# Patient Record
Sex: Female | Born: 1945
Health system: Southern US, Community
[De-identification: ages and names within clinical notes are randomized; demographics above are authoritative.]

## PROBLEM LIST (undated history)

## (undated) DIAGNOSIS — J189 Pneumonia, unspecified organism: Secondary | ICD-10-CM

## (undated) DIAGNOSIS — C7951 Secondary malignant neoplasm of bone: Secondary | ICD-10-CM

## (undated) DIAGNOSIS — D649 Anemia, unspecified: Secondary | ICD-10-CM

## (undated) DIAGNOSIS — C3491 Malignant neoplasm of unspecified part of right bronchus or lung: Principal | ICD-10-CM

## (undated) DIAGNOSIS — Q632 Ectopic kidney: Secondary | ICD-10-CM

## (undated) DIAGNOSIS — I10 Essential (primary) hypertension: Secondary | ICD-10-CM

## (undated) DIAGNOSIS — IMO0001 Reserved for inherently not codable concepts without codable children: Secondary | ICD-10-CM

## (undated) DIAGNOSIS — A159 Respiratory tuberculosis unspecified: Secondary | ICD-10-CM

## (undated) DIAGNOSIS — E7800 Pure hypercholesterolemia, unspecified: Secondary | ICD-10-CM

## (undated) DIAGNOSIS — Z5111 Encounter for antineoplastic chemotherapy: Secondary | ICD-10-CM

## (undated) DIAGNOSIS — M1990 Unspecified osteoarthritis, unspecified site: Secondary | ICD-10-CM

## (undated) DIAGNOSIS — M8580 Other specified disorders of bone density and structure, unspecified site: Secondary | ICD-10-CM

## (undated) HISTORY — DX: Ectopic kidney: Q63.2

## (undated) HISTORY — DX: Encounter for antineoplastic chemotherapy: Z51.11

## (undated) HISTORY — DX: Other specified disorders of bone density and structure, unspecified site: M85.80

## (undated) HISTORY — DX: Malignant neoplasm of unspecified part of right bronchus or lung: C34.91

## (undated) HISTORY — DX: Pure hypercholesterolemia, unspecified: E78.00

## (undated) HISTORY — PX: COLONOSCOPY W/ POLYPECTOMY: SHX1380

## (undated) HISTORY — DX: Essential (primary) hypertension: I10

## (undated) HISTORY — DX: Secondary malignant neoplasm of bone: C79.51

## (undated) NOTE — *Deleted (*Deleted)
CRITICAL VALUE ALERT  Critical Value: WBC 1.3  Date & Time Notied: 05/08/20 2952   Provider Notified:Dr Ghimire at [phone]  Orders Received/Actions taken: ***

---

## 2009-04-16 ENCOUNTER — Ambulatory Visit: Payer: Self-pay | Admitting: Gynecology

## 2009-04-16 ENCOUNTER — Encounter: Payer: Self-pay | Admitting: Gynecology

## 2009-04-16 ENCOUNTER — Other Ambulatory Visit: Admission: RE | Admit: 2009-04-16 | Discharge: 2009-04-16 | Payer: Self-pay | Admitting: Gynecology

## 2010-06-04 ENCOUNTER — Ambulatory Visit: Payer: Self-pay | Admitting: Gynecology

## 2010-06-04 ENCOUNTER — Other Ambulatory Visit
Admission: RE | Admit: 2010-06-04 | Discharge: 2010-06-04 | Payer: Self-pay | Source: Home / Self Care | Admitting: Gynecology

## 2010-06-10 ENCOUNTER — Encounter
Admission: RE | Admit: 2010-06-10 | Discharge: 2010-06-10 | Payer: Self-pay | Source: Home / Self Care | Attending: Gynecology | Admitting: Gynecology

## 2010-07-07 ENCOUNTER — Ambulatory Visit
Admission: RE | Admit: 2010-07-07 | Discharge: 2010-07-07 | Payer: Self-pay | Source: Home / Self Care | Attending: Gynecology | Admitting: Gynecology

## 2010-07-13 ENCOUNTER — Other Ambulatory Visit: Payer: Self-pay

## 2010-07-17 ENCOUNTER — Other Ambulatory Visit: Payer: BC Managed Care – PPO

## 2010-07-17 DIAGNOSIS — M949 Disorder of cartilage, unspecified: Secondary | ICD-10-CM

## 2010-07-17 DIAGNOSIS — M899 Disorder of bone, unspecified: Secondary | ICD-10-CM

## 2010-08-20 ENCOUNTER — Other Ambulatory Visit (INDEPENDENT_AMBULATORY_CARE_PROVIDER_SITE_OTHER): Payer: BC Managed Care – PPO

## 2010-08-20 DIAGNOSIS — E213 Hyperparathyroidism, unspecified: Secondary | ICD-10-CM

## 2010-09-04 ENCOUNTER — Other Ambulatory Visit: Payer: BC Managed Care – PPO

## 2011-06-07 ENCOUNTER — Encounter: Payer: Self-pay | Admitting: Anesthesiology

## 2011-06-07 DIAGNOSIS — E7800 Pure hypercholesterolemia, unspecified: Secondary | ICD-10-CM | POA: Insufficient documentation

## 2011-06-07 DIAGNOSIS — M8580 Other specified disorders of bone density and structure, unspecified site: Secondary | ICD-10-CM | POA: Insufficient documentation

## 2011-06-09 ENCOUNTER — Encounter: Payer: BC Managed Care – PPO | Admitting: Gynecology

## 2011-06-10 ENCOUNTER — Ambulatory Visit (INDEPENDENT_AMBULATORY_CARE_PROVIDER_SITE_OTHER): Payer: BC Managed Care – PPO | Admitting: Gynecology

## 2011-06-10 ENCOUNTER — Encounter: Payer: Self-pay | Admitting: Gynecology

## 2011-06-10 ENCOUNTER — Other Ambulatory Visit (HOSPITAL_COMMUNITY)
Admission: RE | Admit: 2011-06-10 | Discharge: 2011-06-10 | Disposition: A | Discharge status: Home / Self Care | Payer: BC Managed Care – PPO | Source: Ambulatory Visit | Attending: Gynecology | Admitting: Gynecology

## 2011-06-10 VITALS — BP 130/78 | Ht 63.0 in | Wt 151.0 lb

## 2011-06-10 DIAGNOSIS — Z1211 Encounter for screening for malignant neoplasm of colon: Secondary | ICD-10-CM

## 2011-06-10 DIAGNOSIS — Z01419 Encounter for gynecological examination (general) (routine) without abnormal findings: Secondary | ICD-10-CM

## 2011-06-10 DIAGNOSIS — M67919 Unspecified disorder of synovium and tendon, unspecified shoulder: Secondary | ICD-10-CM

## 2011-06-10 DIAGNOSIS — R635 Abnormal weight gain: Secondary | ICD-10-CM

## 2011-06-10 DIAGNOSIS — E7800 Pure hypercholesterolemia, unspecified: Secondary | ICD-10-CM

## 2011-06-10 LAB — CBC WITH DIFFERENTIAL/PLATELET
Basophils Absolute: 0 10*3/uL (ref 0.0–0.1)
Basophils Relative: 1 % (ref 0–1)
Eosinophils Absolute: 0.1 10*3/uL (ref 0.0–0.7)
Eosinophils Relative: 2 % (ref 0–5)
HCT: 39.8 % (ref 36.0–46.0)
Hemoglobin: 12.3 g/dL (ref 12.0–15.0)
Lymphocytes Relative: 37 % (ref 12–46)
Lymphs Abs: 1.7 10*3/uL (ref 0.7–4.0)
MCH: 29.4 pg (ref 26.0–34.0)
MCHC: 30.9 g/dL (ref 30.0–36.0)
MCV: 95 fL (ref 78.0–100.0)
Monocytes Absolute: 0.5 10*3/uL (ref 0.1–1.0)
Monocytes Relative: 11 % (ref 3–12)
Neutro Abs: 2.3 10*3/uL (ref 1.7–7.7)
Neutrophils Relative %: 50 % (ref 43–77)
Platelets: 240 10*3/uL (ref 150–400)
RBC: 4.19 MIL/uL (ref 3.87–5.11)
RDW: 13.4 % (ref 11.5–15.5)
WBC: 4.6 10*3/uL (ref 4.0–10.5)

## 2011-06-10 LAB — URINALYSIS, ROUTINE W REFLEX MICROSCOPIC
Bilirubin Urine: NEGATIVE
Glucose, UA: NEGATIVE mg/dL
Hgb urine dipstick: NEGATIVE
Ketones, ur: NEGATIVE mg/dL
Leukocytes, UA: NEGATIVE
Nitrite: NEGATIVE
Protein, ur: NEGATIVE mg/dL
Specific Gravity, Urine: 1.025 (ref 1.005–1.030)
Urobilinogen, UA: 0.2 mg/dL (ref 0.0–1.0)
pH: 6 (ref 5.0–8.0)

## 2011-06-10 LAB — LIPID PANEL
Cholesterol: 221 mg/dL — ABNORMAL HIGH (ref 0–200)
HDL: 69 mg/dL (ref >39)
LDL Cholesterol: 126 mg/dL — ABNORMAL HIGH (ref 0–99)
Total CHOL/HDL Ratio: 3.2 Ratio
Triglycerides: 128 mg/dL (ref <150)
VLDL: 26 mg/dL (ref 0–40)

## 2011-06-10 LAB — TSH: TSH: 1.024 u[IU]/mL (ref 0.350–4.500)

## 2011-06-10 LAB — GLUCOSE, RANDOM: Glucose, Bld: 86 mg/dL (ref 70–99)

## 2011-06-10 MED ORDER — PREDNISONE (PAK) 10 MG PO TABS: 10.0000 mg | ORAL_TABLET | Freq: Every day | ORAL | Status: AC

## 2011-06-10 MED ORDER — MELOXICAM 7.5 MG PO TABS: 7.5000 mg | ORAL_TABLET | Freq: Every day | ORAL | Status: AC

## 2011-06-10 NOTE — Progress Notes (Addendum)
Valerie Long 1945-06-28 [id number]   History:    66 y.o.  for annual exam with several complaints one specific of the left shoulder which she has experienced pain when lifting her arm as well as day-to-day activity. She thought maybe she made done some lifting and straining herself. Her primary physician was in [facility] and had been diagnosed with hypercholesterolemia had been placed on simvastatin but she stopped it after 2 months when she read of potential side effects. She's currently on low fat diet only. Patient had bone density study in January 2012 would decrease mineralization at the left femoral neck with the lowest T score of -1.2 but normal Frax indices  Past medical history,surgical history, family history and social history were all reviewed and documented in the EPIC chart.  Gynecologic History Patient's last menstrual period was 06/09/1998. Contraception: none Last Pap: 2011. Results were: normal Last mammogram: 2012. Results were: abnormal  Obstetric History OB History    Grav Para Term Preterm Abortions TAB SAB Ect Mult Living   1 0   1  1   0     # Outc Date GA Lbr Len/2nd Wgt Sex Del Anes PTL Lv   1 SAB                ROS:  Was performed and pertinent positives and negatives are included in the history.  Exam: chaperone present  BP 130/78  Ht 5\' 3"  (1.6 m)  Wt 151 lb (68.493 kg)  BMI 26.75 kg/m2  LMP 06/09/1998  Body mass index is 26.75 kg/(m^2).  General appearance : Well developed well nourished female. No acute distress HEENT: Neck supple, trachea midline, no carotid bruits, no thyroidmegaly Lungs: Clear to auscultation, no rhonchi or wheezes, or rib retractions  Heart: Regular rate and rhythm, no murmurs or gallops Breast:Examined in sitting and supine position were symmetrical in appearance, no palpable masses or tenderness,  no skin retraction, no nipple inversion, no nipple discharge, no skin discoloration, no axillary or  supraclavicular lymphadenopathy Abdomen: no palpable masses or tenderness, no rebound or guarding Extremities: no edema or skin discoloration or tenderness. Left arm was painful on abduction Pelvic:  Bartholin, Urethra, Skene Glands: Within normal limits             Vagina: No gross lesions or discharge  Cervix: No gross lesions or discharge  Uterus  anteverted, normal size, shape and consistency, non-tender and mobile  Adnexa  Without masses or tenderness  Anus and perineum  normal   Rectovaginal  normal sphincter tone without palpated masses or tenderness             Hemoccult obtained results pending at time of this dictation     Assessment/Plan:  66 y.o. female for annual exam with apparent rotator cuff injury she will be referred to the orthopedist. Also during Pap smear it was noted that there was a raised area on the anterior cervical lip she'll be asked to return back to the office in one week for colposcopic evaluation. She was given the name of the gastroenterologist for her to schedule her colonoscopy to she has never had one. Fecal occult blood testing result pending at time of this dictation. 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 The following labs were ordered fasting lipid profile, fasting blood sugar, fecal occult blood testing, CBC, urinalysis, Pap smear as well as TSH. We'll make arrangements for her for ultrasound of the right breast at the breast center and consultation with your the pubic surgeon for rotator cuff injury.    Ok Edwards MD, 3:30 PM 06/10/2011   To help alleviate her rotator cuff discomfort patient will be placed on prednisone 10 mg 10 day pack as well as Mobic 7.5 mg daily for the  next 5-7 days.

## 2011-06-10 NOTE — Patient Instructions (Signed)
La recetas estan en la farmacia. Nos vemos la semana que viene.

## 2011-06-14 LAB — POC HEMOCCULT BLD/STL (OFFICE/1-CARD/DIAGNOSTIC): Fecal Occult Blood, POC: NEGATIVE

## 2011-06-15 ENCOUNTER — Telehealth: Payer: Self-pay | Admitting: *Deleted

## 2011-06-15 NOTE — Telephone Encounter (Signed)
Message copied by Libby Maw on Tue Jun 15, 2011 11:49 AM ------      Message from: Reynaldo Minium H      Created: Thu Jun 10, 2011  3:38 PM       Adilen Pavelko, this patient needs to be referred to the orthopedic surgeon ([facility]) for left rotator cuff injury. Patient also needs to be scheduled for right breast ultrasound as recommended by the breast Center back in January 2012 for which patient did not followup. The area that they have been following is the right breast at the 5:00 position. Thank you for schedule both of these for this patient.

## 2011-06-15 NOTE — Telephone Encounter (Signed)
Lm for patient to call about appt information.  Will see Dr. Thurston Hole on 06/22/11 @ 9am.  Records have been sent.

## 2011-06-17 ENCOUNTER — Ambulatory Visit (INDEPENDENT_AMBULATORY_CARE_PROVIDER_SITE_OTHER): Payer: BC Managed Care – PPO | Admitting: Gynecology

## 2011-06-17 ENCOUNTER — Encounter: Payer: Self-pay | Admitting: Gynecology

## 2011-06-17 VITALS — BP 124/80

## 2011-06-17 DIAGNOSIS — N952 Postmenopausal atrophic vaginitis: Secondary | ICD-10-CM

## 2011-06-17 DIAGNOSIS — N889 Noninflammatory disorder of cervix uteri, unspecified: Secondary | ICD-10-CM

## 2011-06-17 MED ORDER — ESTRADIOL 10 MCG VA TABS: 1.0000 | ORAL_TABLET | VAGINAL | Status: DC

## 2011-06-17 NOTE — Patient Instructions (Signed)
Control del colesterol  Los niveles de colesterol en el organismo estn determinados significativamente por su dieta. Los niveles de colesterol tambin se relacionan con la enfermedad cardaca. El material que sigue ayuda a explicar esta relacin y a analizar qu puede hacer para mantener su corazn sano. No todo el colesterol es malo. Las lipoprotenas de baja densidad (LDL) forman el colesterol "malo". El colesterol malo puede ocasionar depsitos de grasa que se acumulan en el interior de las arterias. Las lipoprotenas de alta densidad (HDL) es el colesterol "bueno". Ayuda a remover el colesterol LDL "malo" de la sangre. El colesterol es un factor de riesgo muy importante para la enfermedad cardaca. Otros factores de riesgo son la hipertensin arterial, el hbito de fumar, el estrs, la herencia y el peso.   El msculo cardaco obtiene el suministro de sangre a travs de las arterias coronarias. Si su colesterol LDL ("malo") est elevado y el HDL ("bueno") es bajo, tiene un factor de riesgo para que se formen depsitos de grasa en las arterias coronarias (los vasos sanguneos que suministran sangre al corazn). Esto hace que haya menos lugar para que la sangre circule. Sin la suficiente sangre y oxgeno, el msculo cardaco no puede funcionar correctamente, y usted podr sentir dolores en el pecho (angina pectoris). Cuando una arteria coronaria se cierra completamente, una parte del msculo cardaco puede morir (infarto de miocardio).  CONTROL DEL COLESTEROL Cuando el profesional que lo asiste enva la sangre al laboratorio para conocer el nivel de colesterol, puede realizarle tambin un perfil completo de los lpidos. Con esta prueba, se puede determinar la cantidad total de colesterol, as como los niveles de LDL y HDL. Los triglicridos son un tipo de grasa que circula en la sangre y que tambin puede utilizarse  para determinar el riesgo de enfermedad cardaca. En la siguiente tabla se establecen los nmeros ideales: Prueba: Colesterol total  Menos de 200 mg/dl.  Prueba: LDL "colesterol malo"  Menos de 100 mg/dl.   Menos de 70 mg/dl si tiene riesgo muy elevado de sufrir un ataque cardaco o muerte cardaca sbita.  Prueba: HDL "colesterol bueno"  Mujeres: Ms de 50 mg/dl.   Hombres: Ms de 40 mg/dl.  Prueba: Trigliceridos  Menos de 150 mg/dl.    CONTROL DEL COLESTEROL CON DIETA Aunque factores como el ejercicio y el estilo de vida son importantes, la "primera lnea de ataque" es la dieta. Esto se debe a que se sabe que ciertos alimentos hacen subir el colesterol y otros lo bajan. El objetivo debe ser equilibrar los alimentos, de modo que tengan un efecto sobre el colesterol y, an ms importante, reemplazar las grasas saturadas y trans con otros tipos de grasas, como las monoinsaturadas y las poliinsaturadas y cidos grasos omega-3 . En promedio, una persona no debe consumir ms de 15 a 17 g de grasas saturadas por da. Las grasas saturadas y trans se consideran grasas "malas", ya que elevan el colesterol LDL. Las grasas saturadas se encuentran principalmente en productos animales como carne, manteca y crema. Pero esto no significa que usted debe sacrificar todas sus comidas favoritas. Actualmente, como lo muestra el cuadro que figura al final de este documento, hay sustitutos de buen sabor, bajos en grasas y en colesterol, para la mayora de los alimentos que a usted le gusta comer. Elija aquellos alimentos alternativos que sean bajos en grasas o sin grasas. Elija cortes de carne del cuarto trasero o lomo ya que estos cortes son los que tienen menor cantidad de grasa   y colesterol. El pollo (sin piel), el pescado, la carne de ternera, y la pechuga de pavo molida son excelentes opciones. Elimine las carnes grasosas como los hotdogs o el salami. Los mariscos tienen poco o nada de grasas saturadas. Cuando  consuma carne magra, carne de aves de corral, o pescado, hgalo en porciones de 85 gramos (3 onzas). Las grasas trans tambin se llaman "aceites parcialmente hidrogenados". Son aceites manipulados cientficamente de modo que son slidos a temperatura ambiente, tienen una larga vida y mejoran el sabor y la textura de los alimentos a los que se agregan. Las grasas trans se encuentran en la margarina, masitas, crackers y alimentos horneados.  Para hornear y cocinar, el aceite es un excelente sustituto para la mantequilla. Los aceites monoinsaturados tienen un beneficio particular, ya que se cree que disminuyen el colesterol LDL (colesterol malo) y elevan el HDL. Deber evitar los aceites tropicales saturados como el de coco y el de palma.  Recuerde, adems, que puede comer sin restricciones los grupos de alimentos que son naturalmente libres de grasas saturadas y grasas trans, entre los que se incluyen el pescado, las frutas (excepto el aguacate), verduras, frijoles, cereales (cebada, arroz, cuzcuz, trigo) y las pastas (sin salsas con crema)   IDENTIFIQUE LOS ALIMENTOS QUE DISMINUYEN EL COLESTEROL  Pueden disminuir el colesterol las fibras solubles que estn en las frutas, como las manzanas, en los vegetales como el brcoli, las patatas y las zanahorias; en las legumbres como frijoles, guisantes y lentejas; y en los cereales como la cebada. Los alimentos fortificados con fitosteroles tambin pueden disminuir el colesterol. Debe consumir al menos 2 g de estos alimentos a diario para obtener el efecto de disminucin de colesterol.  En el supermercado, lea las etiquetas de los envases para identificar los alimentos bajos en grasas saturadas, libres de grasas trans y bajos en grasas, . Elija quesos que tengan solo de 2 a 3 g de grasa saturada por onza (28,35 g). Use una margarina que no dae el corazn, libre de grasas trans o aceite parcialmente hidrogenado. Al comprar alimentos horneados (galletitas dulces y  galletas) evite el aceite parcialmente hidrogenado. Los panes y bollos debern ser de granos enteros (harina de maz o de avena entera, en lugar de "harina" o "harina enriquecida"). Compre sopas en lata que no sean cremosas, con bajo contenido de sal y sin grasas adicionadas.   TCNICAS DE PREPARACIN DE LOS ALIMENTOS  Nunca fra los alimentos en aceite abundante. Si debe frer, hgalo en poco aceite y removiendo constantemente, porque as se utilizan muy pocas grasas, o utilice un spray antiadherente. Cuando le sea posible, hierva, hornee o ase las carnes y cocine los vegetales al vapor. En vez de aderezar los vegetales con mantequilla o margarina, utilice limn y hierbas, pur de manzanas y canela (para las calabazas y batatas), yogurt y salsa descremados y aderezos para ensaladas bajos en contenido graso.   BAJO EN GRASAS SATURADAS / SUSTITUTOS BAJOS EN GRASA  Carnes / Grasas saturadas (g)  Evite: Bife, corte graso (3 oz/85 g) / 11 g   Elija: Bife, corte magro (3 oz/85 g) / 4 g   Evite: Hamburguesa (3 oz/85 g) / 7 g   Elija:  Hamburguesa magra (3 oz/85 g) / 5 g   Evite: Jamn (3 oz/85 g) / 6 g   Elija:  Jamn magro (3 oz/85 g) / 2.4 g   Evite: Pollo, con piel (3 oz/85 g), Carne oscura / 4 g   Elija:  Pollo, sin piel (  3 oz/85 g), Carne oscura / 2 g   Evite: Pollo, con piel (3 oz/85 g), Carne magra / 2.5 g   Elija: Pollo, sin piel (3 oz/85 g), Carne magra / 1 g  Lcteos / Grasas saturadas (g)  Evite: Leche entera (1 taza) / 5 g   Elija: Leche con bajo contenido de grasa, 2% (1 taza) / 3 g   Elija: Leche con bajo contenido de grasa, 1% (1 taza) / 1.5 g   Elija: Leche descremada (1 taza) / 0.3 g   Evite: Queso duro (1 oz/28 g) / 6 g   Elija: Queso descremado (1 oz/28 g) / 2-3 g   Evite: Queso cottage, 4% grasa (1 taza)/ 6.5 g   Elija: Queso cottage con bajo contenido de grasa, 1% grasa (1 taza)/ 1.5 g   Evite: Helado (1 taza) / 9 g   Elija: Sorbete (1 taza) / 2.5 g    Elija: Yogurt helado sin contenido de grasa (1 taza) / 0.3 g   Elija: Barras de fruta congeladas / vestigios   Evite: Crema batida (1 cucharada) / 3.5 g   Elija: Batidos glac sin lcteos (1 cucharada) / 1 g  Condimentos / Grasas saturadas (g)  Evite: Mayonesa (1 cucharada) / 2 g   Elija: Mayonesa con bajo contenido de grasa (1 cucharada) / 1 g   Evite: Manteca (1 cucharada) / 7 g   Elija: Margarina extra light (1 cucharada) / 1 g   Evite: Aceite de coco (1 cucharada) / 11.8 g   Elija: Aceite de oliva (1 cucharada) / 1.8 g   Elija: Aceite de maz (1 cucharada) / 1.7 g   Elija: Aceite de crtamo (1 cucharada) / 1.2 g   Elija: Aceite de girasol (1 cucharada) / 1.4 g   Elija: Aceite de soja (1 cucharada) / 2.4 g   Elija: Aceite de canola (1 cucharada) / 1 g  Document Released: 05/24/2005 Document Revised: 02/03/2011 [employer] Patient Information 2012 [employer].  

## 2011-06-17 NOTE — Telephone Encounter (Signed)
Patient was informed.

## 2011-06-17 NOTE — Progress Notes (Signed)
Patient was asked to come in to the office today for colposcopic evaluation due to the fact that time of her annual exam on January 3 there was an irregularity noted on the anterior cervical lip. Her Pap smear came back normal. Her recent labs to include urinalysis, CBC, TSH, blood sugar were all normal her lipid profile demonstrated a slightly elevated total cholesterol of 221 and her LDH was elevated at 126.  Colposcopic evaluation: External genitalia no abnormalities  vagina and fornices no abnormality. Acetic acid was applied no gross lesions or colposcopic visualize lesions on the vagina surfaces fornices or cervix. Anteriorly patient has a raised cervical mucosa but similar in appearance and texture to the rest of the cervical mucosa no abnormality was noted.  Assessment/plan: Normal findings though colposcopic exam normal Pap smear. Patient reassured. Patient given literature formation in Spanish on exercise a low-fat diet. Will BE needing to check her fasting lipid profile within a year. She has an appointment with the orthopedist for what appears to be a rotator cuff injury of her left shoulder. She was given steroid pack recently with Mobic and has helped her temporarily.

## 2011-06-28 ENCOUNTER — Ambulatory Visit (INDEPENDENT_AMBULATORY_CARE_PROVIDER_SITE_OTHER): Payer: BC Managed Care – PPO | Admitting: Gynecology

## 2011-06-28 ENCOUNTER — Other Ambulatory Visit (HOSPITAL_COMMUNITY)
Admission: RE | Admit: 2011-06-28 | Discharge: 2011-06-28 | Disposition: A | Discharge status: Home / Self Care | Payer: BC Managed Care – PPO | Source: Ambulatory Visit | Attending: Gynecology | Admitting: Gynecology

## 2011-06-28 ENCOUNTER — Encounter: Payer: Self-pay | Admitting: Gynecology

## 2011-06-28 VITALS — BP 120/82

## 2011-06-28 DIAGNOSIS — Z01419 Encounter for gynecological examination (general) (routine) without abnormal findings: Secondary | ICD-10-CM | POA: Insufficient documentation

## 2011-06-28 DIAGNOSIS — R87616 Satisfactory cervical smear but lacking transformation zone: Secondary | ICD-10-CM

## 2011-06-28 DIAGNOSIS — Z1159 Encounter for screening for other viral diseases: Secondary | ICD-10-CM | POA: Insufficient documentation

## 2011-06-28 NOTE — Progress Notes (Signed)
Patient was asked to return to the office due to the fact that time of her annual exam recently there was no evidence of endocervical cells. Her Pap smear was repeated today with a vigorous endocervical sampling along with ayre spatula. Will notify her there is any abnormalities otherwise we'll see her back next year. We did discuss the new screening Pap smear guidelines should be done every 3 years then she will no longer needed after the age of 35 6 she's had no history of dysplasia in the past. Instructions were provided in Spanish and we'll follow accordingly.

## 2011-06-28 NOTE — Patient Instructions (Signed)
Si no la llamamos de aqui al [employer] estan normales y nos vemos el ano que viene.

## 2011-08-19 ENCOUNTER — Other Ambulatory Visit: Payer: Self-pay | Admitting: Gynecology

## 2011-08-19 DIAGNOSIS — N644 Mastodynia: Secondary | ICD-10-CM

## 2011-08-26 ENCOUNTER — Ambulatory Visit
Admission: RE | Admit: 2011-08-26 | Discharge: 2011-08-26 | Disposition: A | Discharge status: Home / Self Care | Payer: BC Managed Care – PPO | Source: Ambulatory Visit | Attending: Gynecology | Admitting: Gynecology

## 2011-08-26 DIAGNOSIS — N644 Mastodynia: Secondary | ICD-10-CM

## 2011-09-22 ENCOUNTER — Encounter: Payer: Self-pay | Admitting: Gynecology

## 2012-08-29 ENCOUNTER — Encounter: Payer: Self-pay | Admitting: Internal Medicine

## 2012-08-29 ENCOUNTER — Ambulatory Visit (INDEPENDENT_AMBULATORY_CARE_PROVIDER_SITE_OTHER): Payer: BC Managed Care – PPO | Admitting: Internal Medicine

## 2012-08-29 VITALS — BP 122/78 | HR 103 | Temp 98.3°F | Ht 63.0 in | Wt 155.0 lb

## 2012-08-29 DIAGNOSIS — M25519 Pain in unspecified shoulder: Secondary | ICD-10-CM | POA: Insufficient documentation

## 2012-08-29 DIAGNOSIS — Z0000 Encounter for general adult medical examination without abnormal findings: Secondary | ICD-10-CM | POA: Insufficient documentation

## 2012-08-29 MED ORDER — MELOXICAM 7.5 MG PO TABS: 7.5000 mg | ORAL_TABLET | Freq: Every day | ORAL | Status: DC | PRN

## 2012-08-29 NOTE — Patient Instructions (Addendum)
Come back fasting: CMP- FLP- CBC- TSH- Vit D---- v70 Next visit in one year

## 2012-08-29 NOTE — Assessment & Plan Note (Addendum)
Patient fall last year, developed bilateral shoulder pain, went to see orthopedic surgery, Dr. Sheliah Hatch office, 4 visits, had shots in both shoulders, left shoulder is better, still has right shoulder pain. No previous MRI. Plan: Refer to or for for a second opinion. Request mobic, Rx sent, warned about side effects

## 2012-08-29 NOTE — Progress Notes (Signed)
  Subjective:    Patient ID: Valerie Long, female    DOB: Sep 12, 1945, 67 y.o.   MRN: [id number]  HPI New patient , CPX In general doing well, did complain of shoulder pain, see assessment and plan.  Past Medical History  Diagnosis Date  . Osteopenia   . Hypercholesterolemia   . 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 Cesarean section      myomectomy   History   Social History  . Marital Status: Legally Separated    Spouse Name: N/A    Number of Children: 1  . Years of Education: N/A   Occupational History  . 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 Smoking status: Never Smoker   . Smokeless tobacco: Never Used  . Alcohol Use: Yes     Comment: SOCIAL  . Drug Use: No  . Sexually Active: No   Other Topics Concern  . Not on file   Social History Narrative   From [place], moved from [place]  to [place] 2008   Lives by herself   Diet- try to eat healthy   Exercise- usually 3-4 / week, walks , some weights   Family History  Problem Relation Age of Onset  . Diabetes Other     auncle-aunts   . Stroke Neg Hx   . Hypertension Mother     F and M  . Cancer Other     UTERINE   . CAD Father   . Breast cancer Neg Hx   . Colon cancer Neg Hx     Review of Systems  Respiratory: Negative for cough and shortness of breath.   Cardiovascular: Negative for chest pain and leg swelling.  Gastrointestinal: Negative for abdominal pain and blood in stool.  Genitourinary: Negative for dysuria, hematuria and difficulty urinating.  Psychiatric/Behavioral:       No anxiety-depression       Objective:   Physical Exam General -- alert, well-developed, NAD  Neck --no thyromegaly , normal carotid pulse Lungs -- normal respiratory effort, no intercostal retractions, no accessory muscle use, and normal breath sounds.   Heart-- normal rate, regular rhythm, no murmur, and no gallop.   Abdomen--soft,  non-tender, no distention, no masses, no HSM, no guarding, and no rigidity.   Extremities-- no pretibial edema bilaterally; shoulders symmetric to inspection, slt tender to palpation at the R AC joint, ROM slt decreased on the R  Neurologic-- alert & oriented X3 and strength normal in all extremities. Psych-- Cognition and judgment appear intact. Alert and cooperative with normal attention span and concentration.  not anxious appearing and not depressed appearing.       Assessment & Plan:

## 2012-08-29 NOTE — Assessment & Plan Note (Addendum)
Tdap 2008 per pt Shingles shot discussed, will think about it.  Cscope , Dr Loreta Ave [phone], 1 polyp, next per GI [facility] 3-13 (-) Female care per gyn, last PAP N1-2013 DEXA-- pt reports done per Dr Lily Peer Diet-exercise discussed Labs  EKG nsr

## 2012-09-01 ENCOUNTER — Other Ambulatory Visit: Payer: BC Managed Care – PPO

## 2012-09-01 ENCOUNTER — Other Ambulatory Visit (INDEPENDENT_AMBULATORY_CARE_PROVIDER_SITE_OTHER): Payer: BC Managed Care – PPO

## 2012-09-01 DIAGNOSIS — Z0000 Encounter for general adult medical examination without abnormal findings: Secondary | ICD-10-CM

## 2012-09-01 LAB — CBC WITH DIFFERENTIAL/PLATELET
Basophils Absolute: 0 10*3/uL (ref 0.0–0.1)
Basophils Relative: 0.9 % (ref 0.0–3.0)
Eosinophils Absolute: 0.1 10*3/uL (ref 0.0–0.7)
Eosinophils Relative: 2.2 % (ref 0.0–5.0)
HCT: 36.6 % (ref 36.0–46.0)
Hemoglobin: 12.4 g/dL (ref 12.0–15.0)
Lymphocytes Relative: 36.9 % (ref 12.0–46.0)
Lymphs Abs: 1.5 10*3/uL (ref 0.7–4.0)
MCHC: 33.9 g/dL (ref 30.0–36.0)
MCV: 90.3 fl (ref 78.0–100.0)
Monocytes Absolute: 0.3 10*3/uL (ref 0.1–1.0)
Monocytes Relative: 8 % (ref 3.0–12.0)
Neutro Abs: 2.1 10*3/uL (ref 1.4–7.7)
Neutrophils Relative %: 52 % (ref 43.0–77.0)
Platelets: 218 10*3/uL (ref 150.0–400.0)
RBC: 4.06 Mil/uL (ref 3.87–5.11)
RDW: 13.7 % (ref 11.5–14.6)
WBC: 4.1 10*3/uL — ABNORMAL LOW (ref 4.5–10.5)

## 2012-09-01 LAB — COMPREHENSIVE METABOLIC PANEL
ALT: 20 U/L (ref 0–35)
AST: 25 U/L (ref 0–37)
Albumin: 4.1 g/dL (ref 3.5–5.2)
Alkaline Phosphatase: 68 U/L (ref 39–117)
BUN: 18 mg/dL (ref 6–23)
CO2: 30 mEq/L (ref 19–32)
Calcium: 9.4 mg/dL (ref 8.4–10.5)
Chloride: 100 mEq/L (ref 96–112)
Creatinine, Ser: 1 mg/dL (ref 0.4–1.2)
GFR: 56.5 mL/min — ABNORMAL LOW (ref >60.00)
Glucose, Bld: 87 mg/dL (ref 70–99)
Potassium: 4.1 mEq/L (ref 3.5–5.1)
Sodium: 137 mEq/L (ref 135–145)
Total Bilirubin: 1.3 mg/dL — ABNORMAL HIGH (ref 0.3–1.2)
Total Protein: 7.1 g/dL (ref 6.0–8.3)

## 2012-09-01 LAB — TSH: TSH: 0.94 u[IU]/mL (ref 0.35–5.50)

## 2012-09-01 LAB — LIPID PANEL
Cholesterol: 225 mg/dL — ABNORMAL HIGH (ref 0–200)
HDL: 63.4 mg/dL (ref >39.00)
Total CHOL/HDL Ratio: 4
Triglycerides: 119 mg/dL (ref 0.0–149.0)
VLDL: 23.8 mg/dL (ref 0.0–40.0)

## 2012-09-01 LAB — LDL CHOLESTEROL, DIRECT: Direct LDL: 143.4 mg/dL

## 2012-09-04 LAB — VITAMIN D 1,25 DIHYDROXY
Vitamin D 1, 25 (OH)2 Total: 45 pg/mL (ref 18–72)
Vitamin D2 1, 25 (OH)2: <8 pg/mL
Vitamin D3 1, 25 (OH)2: 45 pg/mL

## 2012-09-06 ENCOUNTER — Encounter: Payer: Self-pay | Admitting: *Deleted

## 2013-09-26 ENCOUNTER — Ambulatory Visit (INDEPENDENT_AMBULATORY_CARE_PROVIDER_SITE_OTHER): Payer: BC Managed Care – PPO | Admitting: Gynecology

## 2013-09-26 ENCOUNTER — Encounter: Payer: Self-pay | Admitting: Gynecology

## 2013-09-26 VITALS — BP 120/78 | Ht 63.0 in | Wt 152.0 lb

## 2013-09-26 DIAGNOSIS — Z780 Asymptomatic menopausal state: Secondary | ICD-10-CM

## 2013-09-26 DIAGNOSIS — M949 Disorder of cartilage, unspecified: Secondary | ICD-10-CM

## 2013-09-26 DIAGNOSIS — N952 Postmenopausal atrophic vaginitis: Secondary | ICD-10-CM

## 2013-09-26 DIAGNOSIS — N951 Menopausal and female climacteric states: Secondary | ICD-10-CM

## 2013-09-26 DIAGNOSIS — M8580 Other specified disorders of bone density and structure, unspecified site: Secondary | ICD-10-CM

## 2013-09-26 DIAGNOSIS — M899 Disorder of bone, unspecified: Secondary | ICD-10-CM

## 2013-09-26 NOTE — Patient Instructions (Signed)
Vacuna contra la culebrilla, Lo que debe saber (Shingles Vaccine, What You Need to Know) QU ES LA CULEBRILLA?  Es una erupcin dolorosa de la piel, en la que aparecen ampollas. Esta enfermedad tambin se denomina herpes zoster o zoster.  Se trata de una erupcin que aparece en un lado del rostro o del cuerpo y dura entre 2 y 4 semanas. El sntoma principal es el dolor, que puede ser bastante intenso. Otros sntomas son fiebre, cefalea, escalofros y malestar en el estmago. En casos raros, esta infeccin puede causar neumona, problemas auditivos, ceguera, inflamacin cerebral (encefalitis) o la muerte.  En 1 de cada 5 personas, el dolor intenso puede persistir an despus que la erupcin desaparezca. Este dolor se denomina neurlagia post herptica.  La causa de la culebrilla es el virus varicela-zoster. Este es el mismo virus que causa la varicela. Slo las personas que han sufrido varicela o que han recibido la vacuna contra la varicela pueden tener culebrilla. El virus permanece en el organismo. Puede volver a aparecer muchos aos despus para causar culebrilla.  La culebrilla no se contagia. Sin embargo, una persona que nunca sufri varicela (ni recibi la vacuna) podra contraer varicela contagindose de alguien que padece culebrilla. Esto no es frecuente.  La culebrilla es ms frecuente entre las personas mayores de 50 aos que entre los ms jvenes. Tambin es ms frecuente en personas cuyo sistema inmunolgico est debilitado debido a una enfermedad como el cncer o medicamentos como los corticoides o las drogas utilizadas en quimioterapia.  Al menos 1 milln de personas contrae culebrilla cada ao en los [place]. VACUNA CONTRA LA CULEBRILLA  Esta vacuna fue patentada en 2006. En los ensayos clnicos, se demostr que la vacuna prevena la culebrilla en alrededor de la mitad de las personas. Tambin reduce el dolor asociado a este trastorno.  Se indica una nica dosis de vacuna  en adultos de ms de 60 aos. ALGUNAS PERSONAS NO DEBEN RECIBIR LA VACUNA O DEBEN ESPERAR No deben vacunarse las personas que:  Alguna vez hayan sufrido una reaccin alrgica a la gelatina, al antibitico neomicina o a cualquier otro componente de la vacuna. Si observa una reaccin alrgica grave, comunquese con el mdico.  Su sistema inmunolgico est debilitado debido a:  SIDA u otra enfermedad que afecte el sistema inmunolgico.  Tratamientos con drogas que afecten el sistema inmunolgico como los corticoides.  Tratamientos para el cncer como la radiacin o la quimioterapia.  Tienen una historia de cncer que haya afectado la mdula sea o el sistema linftico, como leucemia o linfoma.  Estn embarazadas o desean estarlo. Las que deseen quedar embarazadas no deben hacerlo hasta al menos 4 semanas luego de recibir esta vacuna. Las personas con enfermedades menores como un resfro, pueden vacunarse. Las personas que sufren enfermedades moderadas o graves deben esperar hasta su recuperacin antes de recibir la vacuna. Aqu se incluye a quienes presenten una temperatura de 101.3 F (38.5 C). CULES SON LOS RIESGOS DE LA VACUNA CONTRA LA CULEBRILLA?  Una vacuna, como cualquier medicamento, puede causar problemas serios, como por ejemplo reacciones alrgicas graves. Sin embargo, el riesgo de que cualquier vacuna cause un dao grave, o la muerte, es extremadamente pequeo.  No se han asociado reacciones de importancia a estas vacunas. Problemas leves  Enrojecimiento, dolor, hinchazn, o picazn en el lugar de la inyeccin (en 1 de cada 3 personas).  Cefalea (en aproximadamente 1 de cada 70 personas). Como todas las vacunas, esta vacuna sigue siendo controlada para hallar problemas   infrecuentes o graves. QU DEBO HACER EN CASO DE UNA REACCIN MODERADA O GRAVE? Qu debo observar? Cualquier estado anormal como una reaccin alrgica grave o fiebre alta. Si ocurre una reaccin alrgica  grave, ocurrir Camera operator unos pocos minutos hasta una hora despus de la aplicacin de la inyeccin. Signos de reaccin alrgica grave que presente dificultad para respirar, debilidad, ronquera o silbidos, ritmo cardaco acelerado, ronchas, mareos, palidez, o hinchazn de la garganta.  Qu debo hacer?  Comunquese con el profesional que lo asiste o lleve inmediatamente a la persona al mdico.  Cuntele al mdico lo que ha sucedido, la fecha y momento en el que ocurri y cundo se aplic la vacuna.  Pdale a su mdico, enfermera o el departamento de salud que informen la reaccin llenando el formulario de Vaccine Adverse Event Report (Informe de reacciones adversas a las vacunas - VAERS). O, puede enviar este informe a travs de la pgina web del VAERS al [email or web address] o llamando al 713-042-2087. El VAERS no proporciona orientacin mdica. CMO PUEDO SABER MS?  El profesional podr darle el prospecto de la vacuna o sugerirle otras fuentes de informacin.  Comunquese con los Doctor, hospital for [employer] and Prevention (Centros para el control y la prevencin de enfermedades, CDC).  Llame al [phone] (1-800-CDC-INFO).  Visite los sitios web de [employer] DiningCalendar.[email or web address].au. CDC Shingles Vaccine-Spanish VIS (03/12/08) Document Released: 11/10/2007 Document Revised: 08/16/2011 ExitCare Patient Information 2014 [place]. Densitometra sea  (Bone Densitometry) La densitometra sea es una radiografa especial que mide la densidad de los huesos y se [place] para predecir el riesgo de fracturas seas. Esta estudio se [place] para Actor contenido mineral y la densidad de los huesos para diagnosticar osteoporosis. La osteoporosis es la prdida de tejido seo que hace que el hueso se debilite. Generalmente ocurre en las mujeres que entran en la menopausia. Pero tambin pueden sufrirla los hombres y personas con otras enfermedades.  PREPARACIN PARA LA PRUEBA   No es necesaria la preparacin.  Linzie Collin EXAMINARSE?   Todas las [employer] de 59 aos.  Las mujeres posmenopusicas (32 a 26 aos) con factores de riesgo para osteoporosis.  Las personas que han sufrido fracturas previas realizando actividades normales.  Las personas de contextura corporal delgada (menos de 127 libras [63.5 kg] o con un ndice de masa corporal [IMC] de menos de 21).  Las personas que tengan un padre que haya sufrido una fractura de cadera o que tengan antecedentes de osteoporosis.  Los fumadores.  Las personas que sufren artritis reumatoidea.  Los que consumen alcohol en exceso (ms de [place]).  Las mujeres con menopausia temprana. CUNDO DEBE REALIZAR UN [place]?  Las guas actuales sugieren que se debe esperar por lo menos 2 aos antes de repetir una prueba de densidad sea, si la primera fue normal. Algunos estudios recientes indican que las mujeres con densidad sea normal pueden esperar unos aos antes de repetir un estudio de densitometra sea. Comente estos temas con su mdico.  HALLAZGOS NORMALES:   Normal: menos de una desviacin estndar por debajo de lo normal (superior a -1).  Osteopenia:  1 a 2,5 desviaciones estndar por debajo de lo normal (-1 a -2,5).  Osteoporosis: ms de 2,5 desviaciones estndar por debajo de lo normal (menos de -2,5). Los [employer] se informan como una "puntuacin T" y Ardelia Mems "puntuacin Z". La puntuacin T es el nmero que compara la densidad sea con la densidad sea de las mujeres [place] y  sanas. La puntuacin Z es un nmero que compara la densidad sea con las puntuaciones de mujeres de la misma Amo, gnero y International aid/development worker.  Los rangos para los resultados normales pueden variar entre diferentes laboratorios y hospitales. Consulte siempre con su mdico despus de Psychologist, counselling estudio para Personal assistant significado de los [place] y si los valores se consideran "dentro de los lmites  normales".  SIGNIFICADO DEL ESTUDIO  El mdico leer los resultados y Teacher, English as a foreign language con usted la importancia y el significado de los [place], as como las opciones de tratamiento y la necesidad de pruebas adicionales, si fuera necesario.  OBTENCIN DE LOS RESULTADOS DE LAS PRUEBAS  Es su responsabilidad retirar el resultado del [place]. Consulte en el laboratorio cuando y cmo podr [employer].  Document Released: 02/16/2012 [facility] Patient Information 2014 [place], [place].

## 2013-09-26 NOTE — Progress Notes (Signed)
Valerie Long [birth year] [id number]   History:    68 y.o.  for GYN exam. Patient was asymptomatic. Patient primary physician wasn't was a [facility] was diagnosed her with hypercholesterolemia and currently she states she is not on any stones but is on diet. Patient is to make an appointment for followup since his been a year. Patient had a bone density that study done in January 2012 with the lowest T score at the left femoral neck -1.2 with normal FRAX analysis. Patient had a colonoscopy in 2013 whereby she stated that benign polyps were removed. Patient denies any past history of abnormal Pap smear. Patient is overdue for mammogram.  Past medical history,surgical history, family history and social history were all reviewed and documented in the EPIC chart.  Gynecologic History Patient's last menstrual period was 06/09/1998. Contraception: none Last Pap: 2013. Results were: normal Last mammogram: 2013. Results were: normal  Obstetric History OB History  Gravida Para Term Preterm AB SAB TAB Ectopic Multiple Living  1 0   1 1    0    # Outcome Date GA Lbr Len/2nd Weight Sex Delivery Anes PTL Lv  1 SAB                ROS: A ROS was performed and pertinent positives and negatives are included in the history.  GENERAL: No fevers or chills. HEENT: No change in vision, no earache, sore throat or sinus congestion. NECK: No pain or stiffness. CARDIOVASCULAR: No chest pain or pressure. No palpitations. PULMONARY: No shortness of breath, cough or wheeze. GASTROINTESTINAL: No abdominal pain, nausea, vomiting or diarrhea, melena or bright red blood per rectum. GENITOURINARY: No urinary frequency, urgency, hesitancy or dysuria. MUSCULOSKELETAL: No joint or muscle pain, no back pain, no recent trauma. DERMATOLOGIC: No rash, no itching, no lesions. ENDOCRINE: No polyuria, polydipsia, no heat or cold intolerance. No recent change in weight. HEMATOLOGICAL: No anemia or easy bruising or  bleeding. NEUROLOGIC: No headache, seizures, numbness, tingling or weakness. PSYCHIATRIC: No depression, no loss of interest in normal activity or change in sleep pattern.     Exam: chaperone present  BP 120/78  Ht 5\' 3"  (1.6 m)  Wt 152 lb (68.947 kg)  BMI 26.93 kg/m2  LMP 06/09/1998  Body mass index is 26.93 kg/(m^2).  General appearance : Well developed well nourished female. No acute distress HEENT: Neck supple, trachea midline, no carotid bruits, no thyroidmegaly Lungs: Clear to auscultation, no rhonchi or wheezes, or rib retractions  Heart: Regular rate and rhythm, no murmurs or gallops Breast:Examined in sitting and supine position were symmetrical in appearance, no palpable masses or tenderness,  no skin retraction, no nipple inversion, no nipple discharge, no skin discoloration, no axillary or supraclavicular lymphadenopathy Abdomen: no palpable masses or tenderness, no rebound or guarding Extremities: no edema or skin discoloration or tenderness  Pelvic:  Bartholin, Urethra, Skene Glands: Within normal limits             Vagina: No gross lesions or discharge, vaginal atrophy  Cervix: No gross lesions or discharge  Uterus  axial, normal size, shape and consistency, non-tender and mobile  Adnexa  Without masses or tenderness  Anus and perineum  normal   Rectovaginal  normal sphincter tone without palpated masses or tenderness             Hemoccult PCP we'll provide     Assessment/Plan:  68 y.o. female with normal GYN exam with the exception of vaginal atrophy. Patient  was scheduled for a bone density study. Pap smear was not done today in accordance to the guidelines. Patient was reminded the importance of calcium vitamin D and regular exercise for osteoporosis prevention. Patient was reminded to schedule her mammogram as was the importance of monthly breast exam. She was reminded also to contact her PCP for followup visit and blood test. Prescription was provided for her to  obtain her shingles vaccine.   Note: This dictation was prepared with  Dragon/digital dictation along withSmart phrase technology. Any transcriptional errors that result from this process are unintentional.   Terrance Mass MD, 11:11 AM 09/26/2013

## 2013-10-05 ENCOUNTER — Other Ambulatory Visit: Payer: Self-pay

## 2013-10-05 DIAGNOSIS — Z1231 Encounter for screening mammogram for malignant neoplasm of breast: Secondary | ICD-10-CM

## 2013-10-19 ENCOUNTER — Ambulatory Visit
Admission: RE | Admit: 2013-10-19 | Discharge: 2013-10-19 | Disposition: A | Discharge status: Home / Self Care | Payer: BC Managed Care – PPO | Source: Ambulatory Visit

## 2013-10-19 DIAGNOSIS — Z1231 Encounter for screening mammogram for malignant neoplasm of breast: Secondary | ICD-10-CM

## 2013-10-25 ENCOUNTER — Ambulatory Visit (INDEPENDENT_AMBULATORY_CARE_PROVIDER_SITE_OTHER): Payer: BC Managed Care – PPO

## 2013-10-25 DIAGNOSIS — Z780 Asymptomatic menopausal state: Secondary | ICD-10-CM

## 2013-10-25 DIAGNOSIS — M949 Disorder of cartilage, unspecified: Secondary | ICD-10-CM

## 2013-10-25 DIAGNOSIS — N951 Menopausal and female climacteric states: Secondary | ICD-10-CM

## 2013-10-25 DIAGNOSIS — M899 Disorder of bone, unspecified: Secondary | ICD-10-CM

## 2013-10-25 DIAGNOSIS — M8580 Other specified disorders of bone density and structure, unspecified site: Secondary | ICD-10-CM

## 2013-12-05 ENCOUNTER — Telehealth: Payer: Self-pay

## 2013-12-05 NOTE — Telephone Encounter (Signed)
Spoke with Alrelaida, patient's sister, who shared that patient is currently out of town and will not be back until tomorrow.  Called and left a message on patient's cell phone reminding her of her appointment for tomorrow with Dr. Etter Sjogren at 0930.

## 2013-12-06 ENCOUNTER — Encounter: Payer: Self-pay | Admitting: Family Medicine

## 2013-12-06 ENCOUNTER — Ambulatory Visit (INDEPENDENT_AMBULATORY_CARE_PROVIDER_SITE_OTHER): Payer: BC Managed Care – PPO | Admitting: Family Medicine

## 2013-12-06 VITALS — BP 116/72 | HR 74 | Temp 97.9°F | Ht 63.0 in | Wt 153.8 lb

## 2013-12-06 DIAGNOSIS — Z0000 Encounter for general adult medical examination without abnormal findings: Secondary | ICD-10-CM

## 2013-12-06 DIAGNOSIS — E785 Hyperlipidemia, unspecified: Secondary | ICD-10-CM

## 2013-12-06 DIAGNOSIS — G5603 Carpal tunnel syndrome, bilateral upper limbs: Secondary | ICD-10-CM

## 2013-12-06 DIAGNOSIS — Z2911 Encounter for prophylactic immunotherapy for respiratory syncytial virus (RSV): Secondary | ICD-10-CM

## 2013-12-06 DIAGNOSIS — Z23 Encounter for immunization: Secondary | ICD-10-CM

## 2013-12-06 DIAGNOSIS — G5600 Carpal tunnel syndrome, unspecified upper limb: Secondary | ICD-10-CM | POA: Insufficient documentation

## 2013-12-06 LAB — BASIC METABOLIC PANEL
BUN: 18 mg/dL (ref 6–23)
CO2: 27 mEq/L (ref 19–32)
Calcium: 9.5 mg/dL (ref 8.4–10.5)
Chloride: 103 mEq/L (ref 96–112)
Creatinine, Ser: 1 mg/dL (ref 0.4–1.2)
GFR: 61.73 mL/min (ref >60.00)
Glucose, Bld: 90 mg/dL (ref 70–99)
Potassium: 4 mEq/L (ref 3.5–5.1)
Sodium: 138 mEq/L (ref 135–145)

## 2013-12-06 LAB — CBC WITH DIFFERENTIAL/PLATELET
Basophils Absolute: 0 10*3/uL (ref 0.0–0.1)
Basophils Relative: 0.8 % (ref 0.0–3.0)
Eosinophils Absolute: 0.1 10*3/uL (ref 0.0–0.7)
Eosinophils Relative: 2.6 % (ref 0.0–5.0)
HCT: 37.8 % (ref 36.0–46.0)
Hemoglobin: 12.7 g/dL (ref 12.0–15.0)
Lymphocytes Relative: 36.6 % (ref 12.0–46.0)
Lymphs Abs: 1.5 10*3/uL (ref 0.7–4.0)
MCHC: 33.6 g/dL (ref 30.0–36.0)
MCV: 91.9 fl (ref 78.0–100.0)
Monocytes Absolute: 0.3 10*3/uL (ref 0.1–1.0)
Monocytes Relative: 7.8 % (ref 3.0–12.0)
Neutro Abs: 2.1 10*3/uL (ref 1.4–7.7)
Neutrophils Relative %: 52.2 % (ref 43.0–77.0)
Platelets: 212 10*3/uL (ref 150.0–400.0)
RBC: 4.11 Mil/uL (ref 3.87–5.11)
RDW: 13.6 % (ref 11.5–15.5)
WBC: 4.1 10*3/uL (ref 4.0–10.5)

## 2013-12-06 LAB — LIPID PANEL
Cholesterol: 242 mg/dL — ABNORMAL HIGH (ref 0–200)
HDL: 75.2 mg/dL (ref >39.00)
LDL Cholesterol: 152 mg/dL — ABNORMAL HIGH (ref 0–99)
NonHDL: 166.8
Total CHOL/HDL Ratio: 3
Triglycerides: 73 mg/dL (ref 0.0–149.0)
VLDL: 14.6 mg/dL (ref 0.0–40.0)

## 2013-12-06 LAB — HEPATIC FUNCTION PANEL
ALT: 14 U/L (ref 0–35)
AST: 19 U/L (ref 0–37)
Albumin: 4.1 g/dL (ref 3.5–5.2)
Alkaline Phosphatase: 64 U/L (ref 39–117)
Bilirubin, Direct: 0 mg/dL (ref 0.0–0.3)
Total Bilirubin: 1.2 mg/dL (ref 0.2–1.2)
Total Protein: 7 g/dL (ref 6.0–8.3)

## 2013-12-06 LAB — TSH: TSH: 0.49 u[IU]/mL (ref 0.35–4.50)

## 2013-12-06 NOTE — Progress Notes (Signed)
\ Subjective:     Valerie Long is a 68 y.o. female and is here for a comprehensive physical exam. The patient reports no problems.  History   Social History  . Marital Status: Legally Separated    Spouse Name: N/A    Number of Children: 1  . Years of Education: N/A   Occupational History  . 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 Smoking status: Never Smoker   . Smokeless tobacco: Never Used  . Alcohol Use: Yes     Comment: SOCIAL  . Drug Use: No  . Sexual Activity: No   Other Topics Concern  . Not on file   Social History Narrative   From [place], moved from [place]  to [place] 2008   Lives by herself   Diet- try to eat healthy   Exercise- usually 3-4 / week, walks , some weights   Health Maintenance  Topic Date Due  . Tetanus/tdap  07/13/1966  . Zostavax  07/14/2007  . Pap Smear  06/27/2012  . Pneumococcal Polysaccharide Vaccine Age 5 And Over  07/13/2012  . Influenza Vaccine  01/05/2014  . Mammogram  10/20/2014  . Colonoscopy  09/16/2021    The following portions of the patient's history were reviewed and updated as appropriate:  She  has a past medical history of Osteopenia; Hypercholesterolemia; and Pelvic kidney. She  does not have any pertinent problems on file. She  has past surgical history that includes Cesarean section. Her family history includes CAD in her father; Cancer in her other; Diabetes in her other; Hypertension in her mother. There is no history of Stroke, Breast cancer, or Colon cancer. She  reports that she has never smoked. She has never used smokeless tobacco. She reports that she drinks alcohol. She reports that she does not use illicit drugs. She has a current medication list which includes the following prescription(s): aspirin and cholecalciferol. Current Outpatient Prescriptions on File Prior to Visit  Medication Sig Dispense Refill  . aspirin 81 MG tablet Take 160 mg by mouth daily.        . cholecalciferol  (VITAMIN D) 1000 UNITS tablet Take 1,000 Units by mouth daily. On OTC Ca and Vit D       No current facility-administered medications on file prior to visit.   She has No Known Allergies..  Review of Systems Review of Systems  Constitutional: Negative for activity change, appetite change and fatigue.  HENT: Negative for hearing loss, congestion, tinnitus and ear discharge.  dentist q37m Eyes: Negative for visual disturbance (see optho q1y -- vision corrected to 20/20 with glasses).  Respiratory: Negative for cough, chest tightness and shortness of breath.   Cardiovascular: Negative for chest pain, palpitations and leg swelling.  Gastrointestinal: Negative for abdominal pain, diarrhea, constipation and abdominal distention.  Genitourinary: Negative for urgency, frequency, decreased urine volume and difficulty urinating.  Musculoskeletal: Negative for back pain, arthralgias and gait problem.  Skin: Negative for color change, pallor and rash.  Neurological: Negative for dizziness, light-headedness, numbness and headaches.  Hematological: Negative for adenopathy. Does not bruise/bleed easily.  Psychiatric/Behavioral: Negative for suicidal ideas, confusion, sleep disturbance, self-injury, dysphoric mood, decreased concentration and agitation.       Objective:    BP 116/72  Pulse 74  Temp(Src) 97.9 F (36.6 C) (Oral)  Ht 5\' 3"  (1.6 m)  Wt 153 lb 12.8 oz (69.763 kg)  BMI 27.25 kg/m2  SpO2 98%  LMP 06/09/1998 General appearance: alert, cooperative,  appears stated age and no distress Head: Normocephalic, without obvious abnormality, atraumatic Eyes: conjunctivae/corneas clear. PERRL, EOM's intact. Fundi benign. Ears: normal TM's and external ear canals both ears Nose: Nares normal. Septum midline. Mucosa normal. No drainage or sinus tenderness. Throat: lips, mucosa, and tongue normal; teeth and gums normal Neck: no adenopathy, no carotid bruit, no JVD, supple, symmetrical, trachea  midline and thyroid not enlarged, symmetric, no tenderness/mass/nodules Back: symmetric, no curvature. ROM normal. No CVA tenderness. Lungs: clear to auscultation bilaterally Breasts: gyn Heart: regular rate and rhythm, S1, S2 normal, no murmur, click, rub or gallop Abdomen: soft, non-tender; bowel sounds normal; no masses,  no organomegaly Pelvic: deferred --gyn Extremities: extremities normal, atraumatic, no cyanosis or edema Pulses: 2+ and symmetric Skin: Skin color, texture, turgor normal. No rashes or lesions Lymph nodes: Cervical, supraclavicular, and axillary nodes normal. Neurologic: Alert and oriented X 3, normal strength and tone. Normal symmetric reflexes. Normal coordination and gait Psych-- no depression, no anxiety      Assessment:    Healthy female exam.       Plan:    ghm utd Check labs--urine done at gyn See After Visit Summary for Counseling Recommendations   1. Preventative health care  - Basic metabolic panel - CBC with Differential - Hepatic function panel - Lipid panel - TSH  2. Other and unspecified hyperlipidemia Check labs  - Basic metabolic panel - CBC with Differential - Hepatic function panel - Lipid panel - TSH  3. Bilateral carpal tunnel syndrome Pt will wear her splints and call us if she wants a referral

## 2013-12-06 NOTE — Patient Instructions (Signed)
Preventive Care for Adults A healthy lifestyle and preventive care can promote health and wellness. Preventive health guidelines for women include the following key practices.  A routine yearly physical is a good way to check with your health care provider about your health and preventive screening. It is a chance to share any concerns and updates on your health and to receive a thorough exam.  Visit your dentist for a routine exam and preventive care every 6 months. Brush your teeth twice a day and floss once a day. Good oral hygiene prevents tooth decay and gum disease.  The frequency of eye exams is based on your age, health, family medical history, use of contact lenses, and other factors. Follow your health care provider's recommendations for frequency of eye exams.  Eat a healthy diet. Foods like vegetables, fruits, whole grains, low-fat dairy products, and lean protein foods contain the nutrients you need without too many calories. Decrease your intake of foods high in solid fats, added sugars, and salt. Eat the right amount of calories for you.Get information about a proper diet from your health care provider, if necessary.  Regular physical exercise is one of the most important things you can do for your health. Most adults should get at least 150 minutes of moderate-intensity exercise (any activity that increases your heart rate and causes you to sweat) each week. In addition, most adults need muscle-strengthening exercises on 2 or more days a week.  Maintain a healthy weight. The body mass index (BMI) is a screening tool to identify possible weight problems. It provides an estimate of body fat based on height and weight. Your health care provider can find your BMI, and can help you achieve or maintain a healthy weight.For adults 20 years and older:  A BMI below 18.5 is considered underweight.  A BMI of 18.5 to 24.9 is normal.  A BMI of 25 to 29.9 is considered overweight.  A BMI of  30 and above is considered obese.  Maintain normal blood lipids and cholesterol levels by exercising and minimizing your intake of saturated fat. Eat a balanced diet with plenty of fruit and vegetables. Blood tests for lipids and cholesterol should begin at age 52 and be repeated every 5 years. If your lipid or cholesterol levels are high, you are over 50, or you are at high risk for heart disease, you may need your cholesterol levels checked more frequently.Ongoing high lipid and cholesterol levels should be treated with medicines if diet and exercise are not working.  If you smoke, find out from your health care provider how to quit. If you do not use tobacco, do not start.  Lung cancer screening is recommended for adults aged 37-80 years who are at high risk for developing lung cancer because of a history of smoking. A yearly low-dose CT scan of the lungs is recommended for people who have at least a 30-pack-year history of smoking and are a current smoker or have quit within the past 15 years. A pack year of smoking is smoking an average of 1 pack of cigarettes a day for 1 year (for example: 1 pack a day for 30 years or 2 packs a day for 15 years). Yearly screening should continue until the smoker has stopped smoking for at least 15 years. Yearly screening should be stopped for people who develop a health problem that would prevent them from having lung cancer treatment.  If you are pregnant, do not drink alcohol. If you are breastfeeding,  be very cautious about drinking alcohol. If you are not pregnant and choose to drink alcohol, do not have more than 1 drink per day. One drink is considered to be 12 ounces (355 mL) of beer, 5 ounces (148 mL) of wine, or 1.5 ounces (44 mL) of liquor.  Avoid use of street drugs. Do not share needles with anyone. Ask for help if you need support or instructions about stopping the use of drugs.  High blood pressure causes heart disease and increases the risk of  stroke. Your blood pressure should be checked at least every 1 to 2 years. Ongoing high blood pressure should be treated with medicines if weight loss and exercise do not work.  If you are 75-52 years old, ask your health care provider if you should take aspirin to prevent strokes.  Diabetes screening involves taking a blood sample to check your fasting blood sugar level. This should be done once every 3 years, after age 15, if you are within normal weight and without risk factors for diabetes. Testing should be considered at a younger age or be carried out more frequently if you are overweight and have at least 1 risk factor for diabetes.  Breast cancer screening is essential preventive care for women. You should practice "breast self-awareness." This means understanding the normal appearance and feel of your breasts and may include breast self-examination. Any changes detected, no matter how small, should be reported to a health care provider. Women in their 58s and 30s should have a clinical breast exam (CBE) by a health care provider as part of a regular health exam every 1 to 3 years. After age 16, women should have a CBE every year. Starting at age 53, women should consider having a mammogram (breast X-ray test) every year. Women who have a family history of breast cancer should talk to their health care provider about genetic screening. Women at a high risk of breast cancer should talk to their health care providers about having an MRI and a mammogram every year.  Breast cancer gene (BRCA)-related cancer risk assessment is recommended for women who have family members with BRCA-related cancers. BRCA-related cancers include breast, ovarian, tubal, and peritoneal cancers. Having family members with these cancers may be associated with an increased risk for harmful changes (mutations) in the breast cancer genes BRCA1 and BRCA2. Results of the assessment will determine the need for genetic counseling and  BRCA1 and BRCA2 testing.  Routine pelvic exams to screen for cancer are no longer recommended for nonpregnant women who are considered low risk for cancer of the pelvic organs (ovaries, uterus, and vagina) and who do not have symptoms. Ask your health care provider if a screening pelvic exam is right for you.  If you have had past treatment for cervical cancer or a condition that could lead to cancer, you need Pap tests and screening for cancer for at least 20 years after your treatment. If Pap tests have been discontinued, your risk factors (such as having a new sexual partner) need to be reassessed to determine if screening should be resumed. Some women have medical problems that increase the chance of getting cervical cancer. In these cases, your health care provider may recommend more frequent screening and Pap tests.  The HPV test is an additional test that may be used for cervical cancer screening. The HPV test looks for the virus that can cause the cell changes on the cervix. The cells collected during the Pap test can be  tested for HPV. The HPV test could be used to screen women aged 47 years and older, and should be used in women of any age who have unclear Pap test results. After the age of 36, women should have HPV testing at the same frequency as a Pap test.  Colorectal cancer can be detected and often prevented. Most routine colorectal cancer screening begins at the age of 38 years and continues through age 58 years. However, your health care provider may recommend screening at an earlier age if you have risk factors for colon cancer. On a yearly basis, your health care provider may provide home test kits to check for hidden blood in the stool. Use of a small camera at the end of a tube, to directly examine the colon (sigmoidoscopy or colonoscopy), can detect the earliest forms of colorectal cancer. Talk to your health care provider about this at age 64, when routine screening begins. Direct  exam of the colon should be repeated every 5-10 years through age 21 years, unless early forms of pre-cancerous polyps or small growths are found.  People who are at an increased risk for hepatitis B should be screened for this virus. You are considered at high risk for hepatitis B if:  You were born in a country where hepatitis B occurs often. Talk with your health care provider about which countries are considered high risk.  Your parents were born in a high-risk country and you have not received a shot to protect against hepatitis B (hepatitis B vaccine).  You have HIV or AIDS.  You use needles to inject street drugs.  You live with, or have sex with, someone who has Hepatitis B.  You get hemodialysis treatment.  You take certain medicines for conditions like cancer, organ transplantation, and autoimmune conditions.  Hepatitis C blood testing is recommended for all people born from [birth year] through 1965 and any individual with known risks for hepatitis C.  Practice safe sex. Use condoms and avoid high-risk sexual practices to reduce the spread of sexually transmitted infections (STIs). STIs include gonorrhea, chlamydia, syphilis, trichomonas, herpes, HPV, and human immunodeficiency virus (HIV). Herpes, HIV, and HPV are viral illnesses that have no cure. They can result in disability, cancer, and death.  You should be screened for sexually transmitted illnesses (STIs) including gonorrhea and chlamydia if:  You are sexually active and are younger than 24 years.  You are older than 24 years and your health care provider tells you that you are at risk for this type of infection.  Your sexual activity has changed since you were last screened and you are at an increased risk for chlamydia or gonorrhea. Ask your health care provider if you are at risk.  If you are at risk of being infected with HIV, it is recommended that you take a prescription medicine daily to prevent HIV infection. This is  called preexposure prophylaxis (PrEP). You are considered at risk if:  You are a heterosexual woman, are sexually active, and are at increased risk for HIV infection.  You take drugs by injection.  You are sexually active with a partner who has HIV.  Talk with your health care provider about whether you are at high risk of being infected with HIV. If you choose to begin PrEP, you should first be tested for HIV. You should then be tested every 3 months for as long as you are taking PrEP.  Osteoporosis is a disease in which the bones lose minerals and strength  with aging. This can result in serious bone fractures or breaks. The risk of osteoporosis can be identified using a bone density scan. Women ages 65 years and over and women at risk for fractures or osteoporosis should discuss screening with their health care providers. Ask your health care provider whether you should take a calcium supplement or vitamin D to reduce the rate of osteoporosis.  Menopause can be associated with physical symptoms and risks. Hormone replacement therapy is available to decrease symptoms and risks. You should talk to your health care provider about whether hormone replacement therapy is right for you.  Use sunscreen. Apply sunscreen liberally and repeatedly throughout the day. You should seek shade when your shadow is shorter than you. Protect yourself by wearing long sleeves, pants, a wide-brimmed hat, and sunglasses year round, whenever you are outdoors.  Once a month, do a whole body skin exam, using a mirror to look at the skin on your back. Tell your health care provider of new moles, moles that have irregular borders, moles that are larger than a pencil eraser, or moles that have changed in shape or color.  Stay current with required vaccines (immunizations).  Influenza vaccine. All adults should be immunized every year.  Tetanus, diphtheria, and acellular pertussis (Td, Tdap) vaccine. Pregnant women should  receive 1 dose of Tdap vaccine during each pregnancy. The dose should be obtained regardless of the length of time since the last dose. Immunization is preferred during the 27th-36th week of gestation. An adult who has not previously received Tdap or who does not know her vaccine status should receive 1 dose of Tdap. This initial dose should be followed by tetanus and diphtheria toxoids (Td) booster doses every 10 years. Adults with an unknown or incomplete history of completing a 3-dose immunization series with Td-containing vaccines should begin or complete a primary immunization series including a Tdap dose. Adults should receive a Td booster every 10 years.  Varicella vaccine. An adult without evidence of immunity to varicella should receive 2 doses or a second dose if she has previously received 1 dose. Pregnant females who do not have evidence of immunity should receive the first dose after pregnancy. This first dose should be obtained before leaving the health care facility. The second dose should be obtained 4-8 weeks after the first dose.  Human papillomavirus (HPV) vaccine. Females aged 13-26 years who have not received the vaccine previously should obtain the 3-dose series. The vaccine is not recommended for use in pregnant females. However, pregnancy testing is not needed before receiving a dose. If a female is found to be pregnant after receiving a dose, no treatment is needed. In that case, the remaining doses should be delayed until after the pregnancy. Immunization is recommended for any person with an immunocompromised condition through the age of 26 years if she did not get any or all doses earlier. During the 3-dose series, the second dose should be obtained 4-8 weeks after the first dose. The third dose should be obtained 24 weeks after the first dose and 16 weeks after the second dose.  Zoster vaccine. One dose is recommended for adults aged 60 years or older unless certain conditions are  present.  Measles, mumps, and rubella (MMR) vaccine. Adults born before 1957 generally are considered immune to measles and mumps. Adults born in 1957 or later should have 1 or more doses of MMR vaccine unless there is a contraindication to the vaccine or there is laboratory evidence of immunity to   each of the three diseases. A routine second dose of MMR vaccine should be obtained at least 28 days after the first dose for students attending postsecondary schools, health care workers, or international travelers. People who received inactivated measles vaccine or an unknown type of measles vaccine during 1963-1967 should receive 2 doses of MMR vaccine. People who received inactivated mumps vaccine or an unknown type of mumps vaccine before 1979 and are at high risk for mumps infection should consider immunization with 2 doses of MMR vaccine. For females of childbearing age, rubella immunity should be determined. If there is no evidence of immunity, females who are not pregnant should be vaccinated. If there is no evidence of immunity, females who are pregnant should delay immunization until after pregnancy. Unvaccinated health care workers born before 1957 who lack laboratory evidence of measles, mumps, or rubella immunity or laboratory confirmation of disease should consider measles and mumps immunization with 2 doses of MMR vaccine or rubella immunization with 1 dose of MMR vaccine.  Pneumococcal 13-valent conjugate (PCV13) vaccine. When indicated, a person who is uncertain of her immunization history and has no record of immunization should receive the PCV13 vaccine. An adult aged 19 years or older who has certain medical conditions and has not been previously immunized should receive 1 dose of PCV13 vaccine. This PCV13 should be followed with a dose of pneumococcal polysaccharide (PPSV23) vaccine. The PPSV23 vaccine dose should be obtained at least 8 weeks after the dose of PCV13 vaccine. An adult aged 19  years or older who has certain medical conditions and previously received 1 or more doses of PPSV23 vaccine should receive 1 dose of PCV13. The PCV13 vaccine dose should be obtained 1 or more years after the last PPSV23 vaccine dose.  Pneumococcal polysaccharide (PPSV23) vaccine. When PCV13 is also indicated, PCV13 should be obtained first. All adults aged 65 years and older should be immunized. An adult younger than age 65 years who has certain medical conditions should be immunized. Any person who resides in a nursing home or long-term care facility should be immunized. An adult smoker should be immunized. People with an immunocompromised condition and certain other conditions should receive both PCV13 and PPSV23 vaccines. People with human immunodeficiency virus (HIV) infection should be immunized as soon as possible after diagnosis. Immunization during chemotherapy or radiation therapy should be avoided. Routine use of PPSV23 vaccine is not recommended for American Indians, [place] Natives, or people younger than 65 years unless there are medical conditions that require PPSV23 vaccine. When indicated, people who have unknown immunization and have no record of immunization should receive PPSV23 vaccine. One-time revaccination 5 years after the first dose of PPSV23 is recommended for people aged 19-64 years who have chronic kidney failure, nephrotic syndrome, asplenia, or immunocompromised conditions. People who received 1-2 doses of PPSV23 before age 65 years should receive another dose of PPSV23 vaccine at age 65 years or later if at least 5 years have passed since the previous dose. Doses of PPSV23 are not needed for people immunized with PPSV23 at or after age 65 years.  Meningococcal vaccine. Adults with asplenia or persistent complement component deficiencies should receive 2 doses of quadrivalent meningococcal conjugate (MenACWY-D) vaccine. The doses should be obtained at least 2 months apart.  Microbiologists working with certain meningococcal bacteria, military recruits, people at risk during an outbreak, and people who travel to or live in countries with a high rate of meningitis should be immunized. A first-year college student up through age   21 years who is living in a residence hall should receive a dose if she did not receive a dose on or after her 16th birthday. Adults who have certain high-risk conditions should receive one or more doses of vaccine.  Hepatitis A vaccine. Adults who wish to be protected from this disease, have certain high-risk conditions, work with hepatitis A-infected animals, work in hepatitis A research labs, or travel to or work in countries with a high rate of hepatitis A should be immunized. Adults who were previously unvaccinated and who anticipate close contact with an international adoptee during the first 60 days after arrival in the [place] from a country with a high rate of hepatitis A should be immunized.  Hepatitis B vaccine. Adults who wish to be protected from this disease, have certain high-risk conditions, may be exposed to blood or other infectious body fluids, are household contacts or sex partners of hepatitis B positive people, are clients or workers in certain care facilities, or travel to or work in countries with a high rate of hepatitis B should be immunized.  Haemophilus influenzae type b (Hib) vaccine. A previously unvaccinated person with asplenia or sickle cell disease or having a scheduled splenectomy should receive 1 dose of Hib vaccine. Regardless of previous immunization, a recipient of a hematopoietic stem cell transplant should receive a 3-dose series 6-12 months after her successful transplant. Hib vaccine is not recommended for adults with HIV infection. Preventive Services / Frequency Ages 43 to 39years  Blood pressure check.** / Every 1 to 2 years.  Lipid and cholesterol check.** / Every 5 years beginning at age  75.  Clinical breast exam.** / Every 3 years for women in their 32s and 74s.  BRCA-related cancer risk assessment.** / For women who have family members with a BRCA-related cancer (breast, ovarian, tubal, or peritoneal cancers).  Pap test.** / Every 2 years from ages 65 through 91. Every 3 years starting at age 34 through age 93 or 72 with a history of 3 consecutive normal Pap tests.  HPV screening.** / Every 3 years from ages 46 through ages 53 to 26 with a history of 3 consecutive normal Pap tests.  Hepatitis C blood test.** / For any individual with known risks for hepatitis C.  Skin self-exam. / Monthly.  Influenza vaccine. / Every year.  Tetanus, diphtheria, and acellular pertussis (Tdap, Td) vaccine.** / Consult your health care provider. Pregnant women should receive 1 dose of Tdap vaccine during each pregnancy. 1 dose of Td every 10 years.  Varicella vaccine.** / Consult your health care provider. Pregnant females who do not have evidence of immunity should receive the first dose after pregnancy.  HPV vaccine. / 3 doses over 6 months, if 70 and younger. The vaccine is not recommended for use in pregnant females. However, pregnancy testing is not needed before receiving a dose.  Measles, mumps, rubella (MMR) vaccine.** / You need at least 1 dose of MMR if you were born in 1957 or later. You may also need a 2nd dose. For females of childbearing age, rubella immunity should be determined. If there is no evidence of immunity, females who are not pregnant should be vaccinated. If there is no evidence of immunity, females who are pregnant should delay immunization until after pregnancy.  Pneumococcal 13-valent conjugate (PCV13) vaccine.** / Consult your health care provider.  Pneumococcal polysaccharide (PPSV23) vaccine.** / 1 to 2 doses if you smoke cigarettes or if you have certain conditions.  Meningococcal vaccine.** /  1 dose if you are age 70 to 51 years and a [place] living in a residence hall, or have one of several medical conditions, you need to get vaccinated against meningococcal disease. You may also need additional booster doses.  Hepatitis A vaccine.** / Consult your health care provider.  Hepatitis B vaccine.** / Consult your health care provider.  Haemophilus influenzae type b (Hib) vaccine.** / Consult your health care provider. Ages 40 to 64years  Blood pressure check.** / Every 1 to 2 years.  Lipid and cholesterol check.** / Every 5 years beginning at age 58 years.  Lung cancer screening. / Every year if you are aged 56-80 years and have a 30-pack-year history of smoking and currently smoke or have quit within the past 15 years. Yearly screening is stopped once you have quit smoking for at least 15 years or develop a health problem that would prevent you from having lung cancer treatment.  Clinical breast exam.** / Every year after age 35 years.  BRCA-related cancer risk assessment.** / For women who have family members with a BRCA-related cancer (breast, ovarian, tubal, or peritoneal cancers).  Mammogram.** / Every year beginning at age 109 years and continuing for as long as you are in good health. Consult with your health care provider.  Pap test.** / Every 3 years starting at age 44 years through age 94 or 70 years with a history of 3 consecutive normal Pap tests.  HPV screening.** / Every 3 years from ages 109 years through ages 50 to 30 years with a history of 3 consecutive normal Pap tests.  Fecal occult blood test (FOBT) of stool. / Every year beginning at age 73 years and continuing until age 59 years. You may not need to do this test if you get a colonoscopy every 10 years.  Flexible sigmoidoscopy or colonoscopy.** / Every 5 years for a flexible sigmoidoscopy or every 10 years for a colonoscopy beginning at age 68 years and continuing until age 12 years.  Hepatitis C blood test.** / For all people born from [birth year] through  1965 and any individual with known risks for hepatitis C.  Skin self-exam. / Monthly.  Influenza vaccine. / Every year.  Tetanus, diphtheria, and acellular pertussis (Tdap/Td) vaccine.** / Consult your health care provider. Pregnant women should receive 1 dose of Tdap vaccine during each pregnancy. 1 dose of Td every 10 years.  Varicella vaccine.** / Consult your health care provider. Pregnant females who do not have evidence of immunity should receive the first dose after pregnancy.  Zoster vaccine.** / 1 dose for adults aged 2 years or older.  Measles, mumps, rubella (MMR) vaccine.** / You need at least 1 dose of MMR if you were born in 1957 or later. You may also need a 2nd dose. For females of childbearing age, rubella immunity should be determined. If there is no evidence of immunity, females who are not pregnant should be vaccinated. If there is no evidence of immunity, females who are pregnant should delay immunization until after pregnancy.  Pneumococcal 13-valent conjugate (PCV13) vaccine.** / Consult your health care provider.  Pneumococcal polysaccharide (PPSV23) vaccine.** / 1 to 2 doses if you smoke cigarettes or if you have certain conditions.  Meningococcal vaccine.** / Consult your health care provider.  Hepatitis A vaccine.** / Consult your health care provider.  Hepatitis B vaccine.** / Consult your health care provider.  Haemophilus influenzae type b (Hib) vaccine.** / Consult your health care provider. Ages 48 years  and over  Blood pressure check.** / Every 1 to 2 years.  Lipid and cholesterol check.** / Every 5 years beginning at age 84 years.  Lung cancer screening. / Every year if you are aged 50-80 years and have a 30-pack-year history of smoking and currently smoke or have quit within the past 15 years. Yearly screening is stopped once you have quit smoking for at least 15 years or develop a health problem that would prevent you from having lung cancer  treatment.  Clinical breast exam.** / Every year after age 24 years.  BRCA-related cancer risk assessment.** / For women who have family members with a BRCA-related cancer (breast, ovarian, tubal, or peritoneal cancers).  Mammogram.** / Every year beginning at age 14 years and continuing for as long as you are in good health. Consult with your health care provider.  Pap test.** / Every 3 years starting at age 17 years through age 31 or 74 years with 3 consecutive normal Pap tests. Testing can be stopped between 65 and 70 years with 3 consecutive normal Pap tests and no abnormal Pap or HPV tests in the past 10 years.  HPV screening.** / Every 3 years from ages 30 years through ages 70 or 28 years with a history of 3 consecutive normal Pap tests. Testing can be stopped between 65 and 70 years with 3 consecutive normal Pap tests and no abnormal Pap or HPV tests in the past 10 years.  Fecal occult blood test (FOBT) of stool. / Every year beginning at age 64 years and continuing until age 92 years. You may not need to do this test if you get a colonoscopy every 10 years.  Flexible sigmoidoscopy or colonoscopy.** / Every 5 years for a flexible sigmoidoscopy or every 10 years for a colonoscopy beginning at age 73 years and continuing until age 39 years.  Hepatitis C blood test.** / For all people born from [birth year] through 1963 and any individual with known risks for hepatitis C.  Osteoporosis screening.** / A one-time screening for women ages 35 years and over and women at risk for fractures or osteoporosis.  Skin self-exam. / Monthly.  Influenza vaccine. / Every year.  Tetanus, diphtheria, and acellular pertussis (Tdap/Td) vaccine.** / 1 dose of Td every 10 years.  Varicella vaccine.** / Consult your health care provider.  Zoster vaccine.** / 1 dose for adults aged 59 years or older.  Pneumococcal 13-valent conjugate (PCV13) vaccine.** / Consult your health care provider.  Pneumococcal  polysaccharide (PPSV23) vaccine.** / 1 dose for all adults aged 8 years and older.  Meningococcal vaccine.** / Consult your health care provider.  Hepatitis A vaccine.** / Consult your health care provider.  Hepatitis B vaccine.** / Consult your health care provider.  Haemophilus influenzae type b (Hib) vaccine.** / Consult your health care provider. ** Family history and personal history of risk and conditions may change your health care provider's recommendations. Document Released: 07/20/2001 Document Revised: 05/29/2013 Document Reviewed: 10/19/2010 [facility] Patient Information 2015 [place], [place]. This information is not intended to replace advice given to you by your health care provider. Make sure you discuss any questions you have with your health care provider.

## 2013-12-06 NOTE — Progress Notes (Signed)
Pre visit review using our clinic review tool, if applicable. No additional management support is needed unless otherwise documented below in the visit note. 

## 2014-04-08 ENCOUNTER — Encounter: Payer: Self-pay | Admitting: Family Medicine

## 2014-08-01 ENCOUNTER — Emergency Department (HOSPITAL_COMMUNITY)
Admission: EM | Admit: 2014-08-01 | Discharge: 2014-08-02 | Disposition: A | Discharge status: Home / Self Care | Payer: BLUE CROSS/BLUE SHIELD | Attending: Emergency Medicine | Admitting: Emergency Medicine

## 2014-08-01 ENCOUNTER — Encounter (HOSPITAL_COMMUNITY): Payer: Self-pay | Admitting: Emergency Medicine

## 2014-08-01 DIAGNOSIS — R112 Nausea with vomiting, unspecified: Secondary | ICD-10-CM

## 2014-08-01 DIAGNOSIS — Z8639 Personal history of other endocrine, nutritional and metabolic disease: Secondary | ICD-10-CM | POA: Diagnosis not present

## 2014-08-01 DIAGNOSIS — Z8739 Personal history of other diseases of the musculoskeletal system and connective tissue: Secondary | ICD-10-CM | POA: Diagnosis not present

## 2014-08-01 DIAGNOSIS — Z7982 Long term (current) use of aspirin: Secondary | ICD-10-CM | POA: Insufficient documentation

## 2014-08-01 DIAGNOSIS — Q632 Ectopic kidney: Secondary | ICD-10-CM | POA: Diagnosis not present

## 2014-08-01 DIAGNOSIS — R109 Unspecified abdominal pain: Secondary | ICD-10-CM | POA: Diagnosis present

## 2014-08-01 LAB — COMPREHENSIVE METABOLIC PANEL
ALT: 15 U/L (ref 0–35)
AST: 24 U/L (ref 0–37)
Albumin: 4.1 g/dL (ref 3.5–5.2)
Alkaline Phosphatase: 68 U/L (ref 39–117)
Anion gap: 10 (ref 5–15)
BUN: 23 mg/dL (ref 6–23)
CO2: 25 mmol/L (ref 19–32)
Calcium: 9.3 mg/dL (ref 8.4–10.5)
Chloride: 99 mmol/L (ref 96–112)
Creatinine, Ser: 0.89 mg/dL (ref 0.50–1.10)
GFR calc Af Amer: 76 mL/min — ABNORMAL LOW (ref >90)
GFR calc non Af Amer: 66 mL/min — ABNORMAL LOW (ref >90)
Glucose, Bld: 138 mg/dL — ABNORMAL HIGH (ref 70–99)
Potassium: 4 mmol/L (ref 3.5–5.1)
Sodium: 134 mmol/L — ABNORMAL LOW (ref 135–145)
Total Bilirubin: 1 mg/dL (ref 0.3–1.2)
Total Protein: 7.1 g/dL (ref 6.0–8.3)

## 2014-08-01 LAB — CBC WITH DIFFERENTIAL/PLATELET
Basophils Absolute: 0 10*3/uL (ref 0.0–0.1)
Basophils Relative: 0 % (ref 0–1)
Eosinophils Absolute: 0 10*3/uL (ref 0.0–0.7)
Eosinophils Relative: 0 % (ref 0–5)
HCT: 38.3 % (ref 36.0–46.0)
Hemoglobin: 12.5 g/dL (ref 12.0–15.0)
Lymphocytes Relative: 4 % — ABNORMAL LOW (ref 12–46)
Lymphs Abs: 0.3 10*3/uL — ABNORMAL LOW (ref 0.7–4.0)
MCH: 30 pg (ref 26.0–34.0)
MCHC: 32.6 g/dL (ref 30.0–36.0)
MCV: 91.8 fL (ref 78.0–100.0)
Monocytes Absolute: 0.3 10*3/uL (ref 0.1–1.0)
Monocytes Relative: 4 % (ref 3–12)
Neutro Abs: 7.7 10*3/uL (ref 1.7–7.7)
Neutrophils Relative %: 92 % — ABNORMAL HIGH (ref 43–77)
Platelets: 187 10*3/uL (ref 150–400)
RBC: 4.17 MIL/uL (ref 3.87–5.11)
RDW: 13.4 % (ref 11.5–15.5)
WBC: 8.4 10*3/uL (ref 4.0–10.5)

## 2014-08-01 LAB — LIPASE, BLOOD: Lipase: 29 U/L (ref 11–59)

## 2014-08-01 NOTE — ED Notes (Signed)
Pt states she has abd pain with nausea and vomiting that started about 4 hrs ago  Pt states she feels weak all over

## 2014-08-02 LAB — URINALYSIS, ROUTINE W REFLEX MICROSCOPIC
Bilirubin Urine: NEGATIVE
Glucose, UA: NEGATIVE mg/dL
Hgb urine dipstick: NEGATIVE
Ketones, ur: NEGATIVE mg/dL
Leukocytes, UA: NEGATIVE
Nitrite: NEGATIVE
Protein, ur: NEGATIVE mg/dL
Specific Gravity, Urine: 1.024 (ref 1.005–1.030)
Urobilinogen, UA: 0.2 mg/dL (ref 0.0–1.0)
pH: 7 (ref 5.0–8.0)

## 2014-08-02 MED ORDER — ONDANSETRON HCL 4 MG/2ML IJ SOLN
4.0000 mg | Freq: Once | INTRAMUSCULAR | Status: AC
  Administered 2014-08-02: 4 mg via INTRAVENOUS
  Filled 2014-08-02: qty 2

## 2014-08-02 MED ORDER — ONDANSETRON 8 MG PO TBDP: 8.0000 mg | ORAL_TABLET | Freq: Three times a day (TID) | ORAL | Status: DC | PRN

## 2014-08-02 MED ORDER — KETOROLAC TROMETHAMINE 15 MG/ML IJ SOLN
15.0000 mg | Freq: Once | INTRAMUSCULAR | Status: AC
  Administered 2014-08-02: 15 mg via INTRAVENOUS
  Filled 2014-08-02: qty 1

## 2014-08-02 MED ORDER — SODIUM CHLORIDE 0.9 % IV BOLUS (SEPSIS)
1000.0000 mL | Freq: Once | INTRAVENOUS | Status: AC
  Administered 2014-08-02: 1000 mL via INTRAVENOUS

## 2014-08-02 NOTE — ED Provider Notes (Addendum)
CSN: [id number]     Arrival date & time 08/01/14  2106 History   First MD Initiated Contact with Patient 08/02/14 0038     Chief Complaint  Patient presents with  . Abdominal Pain     (Consider location/radiation/quality/duration/timing/severity/associated sxs/prior Treatment) HPI  This is a 69 year old female who states she began feeling bad about 2 PM yesterday afternoon. She states she "felt something" in her stomach. She is having difficulty describing this sensation but denies having actual pain. She has vomited 3 times. She has had a subjective fever with chills, a headache and generalized weakness. She denies chest pain, shortness of breath, diarrhea or dysuria.   Past Medical History  Diagnosis Date  . Osteopenia   . Hypercholesterolemia   . 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 Cesarean section      myomectomy   Family History  Problem Relation Age of Onset  . Diabetes Other     auncle-aunts   . Stroke Neg Hx   . Hypertension Mother     F and M  . Cancer Other     UTERINE   . CAD Father   . Breast cancer Neg Hx   . Colon cancer Neg Hx    History  Substance Use Topics  . Smoking status: Never Smoker   . Smokeless tobacco: Never Used  . Alcohol Use: Yes     Comment: SOCIAL   OB History    Gravida Para Term Preterm AB TAB SAB Ectopic Multiple Living   1 0   1  1   0     Review of Systems  All other systems reviewed and are negative.   Allergies  Review of patient's allergies indicates no known allergies.  Home Medications   Prior to Admission medications   Medication Sig Start Date End Date Taking? Authorizing Provider  aspirin 325 MG tablet Take 650 mg by mouth daily as needed for moderate pain or headache (headache and pain).   Yes Historical Provider, MD   BP 136/68 mmHg  Pulse 89  Temp(Src) 99.5 F (37.5 C) (Oral)  Resp 18  SpO2 96%  LMP 06/09/1998   Physical Exam   General: Well-developed, well-nourished female in no acute distress; appearance consistent with age of record HENT: normocephalic; atraumatic Eyes: pupils equal, round and reactive to light; extraocular muscles intact; arcus senilis bilaterally Neck: supple; no meningeal signs  Heart: regular rate and rhythm; no murmurs, rubs or gallops Lungs: clear to auscultation bilaterally Abdomen: soft; nondistended; mild epigastric tenderness; no masses or hepatosplenomegaly; bowel sounds present GU: No CVA tenderness Extremities: No deformity; full range of motion; pulses normal Neurologic: Awake, alert and oriented; motor function intact in all extremities and symmetric; no facial droop Skin: Warm and dry Psychiatric: Flat affect   ED Course  Procedures (including critical care time)   MDM   Nursing notes and vitals signs, including pulse oximetry, reviewed.  Summary of this visit's results, reviewed by myself:  Labs:  Results for orders placed or performed during the hospital encounter of 08/01/14 (from the past 24 hour(s))  CBC with Differential     Status: Abnormal   Collection Time: 08/01/14 10:12 PM  Result Value Ref Range   WBC 8.4 4.0 - 10.5 K/uL   RBC 4.17 3.87 - 5.11 MIL/uL   Hemoglobin 12.5 12.0 - 15.0 g/dL   HCT 38.3 36.0 - 46.0 %  MCV 91.8 78.0 - 100.0 fL   MCH 30.0 26.0 - 34.0 pg   MCHC 32.6 30.0 - 36.0 g/dL   RDW 13.4 11.5 - 15.5 %   Platelets 187 150 - 400 K/uL   Neutrophils Relative % 92 (H) 43 - 77 %   Neutro Abs 7.7 1.7 - 7.7 K/uL   Lymphocytes Relative 4 (L) 12 - 46 %   Lymphs Abs 0.3 (L) 0.7 - 4.0 K/uL   Monocytes Relative 4 3 - 12 %   Monocytes Absolute 0.3 0.1 - 1.0 K/uL   Eosinophils Relative 0 0 - 5 %   Eosinophils Absolute 0.0 0.0 - 0.7 K/uL   Basophils Relative 0 0 - 1 %   Basophils Absolute 0.0 0.0 - 0.1 K/uL  Comprehensive metabolic panel     Status: Abnormal   Collection Time: 08/01/14 10:12 PM  Result Value Ref Range   Sodium 134 (L) 135 -  145 mmol/L   Potassium 4.0 3.5 - 5.1 mmol/L   Chloride 99 96 - 112 mmol/L   CO2 25 19 - 32 mmol/L   Glucose, Bld 138 (H) 70 - 99 mg/dL   BUN 23 6 - 23 mg/dL   Creatinine, Ser 0.89 0.50 - 1.10 mg/dL   Calcium 9.3 8.4 - 10.5 mg/dL   Total Protein 7.1 6.0 - 8.3 g/dL   Albumin 4.1 3.5 - 5.2 g/dL   AST 24 0 - 37 U/L   ALT 15 0 - 35 U/L   Alkaline Phosphatase 68 39 - 117 U/L   Total Bilirubin 1.0 0.3 - 1.2 mg/dL   GFR calc non Af Amer 66 (L) >90 mL/min   GFR calc Af Amer 76 (L) >90 mL/min   Anion gap 10 5 - 15  Lipase, blood     Status: None   Collection Time: 08/01/14 10:12 PM  Result Value Ref Range   Lipase 29 11 - 59 U/L  Urinalysis, Routine w reflex microscopic     Status: None   Collection Time: 08/02/14  3:27 AM  Result Value Ref Range   Color, Urine YELLOW YELLOW   APPearance CLEAR CLEAR   Specific Gravity, Urine 1.024 1.005 - 1.030   pH 7.0 5.0 - 8.0   Glucose, UA NEGATIVE NEGATIVE mg/dL   Hgb urine dipstick NEGATIVE NEGATIVE   Bilirubin Urine NEGATIVE NEGATIVE   Ketones, ur NEGATIVE NEGATIVE mg/dL   Protein, ur NEGATIVE NEGATIVE mg/dL   Urobilinogen, UA 0.2 0.0 - 1.0 mg/dL   Nitrite NEGATIVE NEGATIVE   Leukocytes, UA NEGATIVE NEGATIVE   6:02 AM Patient drinking fluids without emesis. Abdomen soft and nontender. She continues to complain about feeling weak; she was advised her symptoms are likely a viral GI illness.   Wynetta Fines, MD 08/02/14 [id number]  Wynetta Fines, MD 08/02/14 [phone]

## 2014-08-02 NOTE — ED Notes (Signed)
Pt made aware of urine sample needed will attempt again Rn notified

## 2015-02-27 ENCOUNTER — Telehealth: Payer: Self-pay | Admitting: Behavioral Health

## 2015-02-27 NOTE — Telephone Encounter (Signed)
Unable to reach patient at time of Pre-Visit Call.  Left message for patient to return call when available.    

## 2015-02-28 ENCOUNTER — Telehealth: Payer: Self-pay | Admitting: Internal Medicine

## 2015-02-28 ENCOUNTER — Encounter: Payer: BLUE CROSS/BLUE SHIELD | Admitting: Internal Medicine

## 2015-03-06 NOTE — Telephone Encounter (Signed)
Pt was no show 02/28/15 3:00pm for cpe appt, pt has rescheduled for 03/21/15, charge for no show?

## 2015-03-06 NOTE — Telephone Encounter (Signed)
Pt was late, no charge

## 2015-03-21 ENCOUNTER — Ambulatory Visit (INDEPENDENT_AMBULATORY_CARE_PROVIDER_SITE_OTHER): Payer: Medicare Other | Admitting: Internal Medicine

## 2015-03-21 ENCOUNTER — Encounter: Payer: Self-pay | Admitting: Internal Medicine

## 2015-03-21 VITALS — BP 116/70 | HR 72 | Temp 97.7°F | Ht 64.25 in | Wt 156.1 lb

## 2015-03-21 DIAGNOSIS — Z23 Encounter for immunization: Secondary | ICD-10-CM

## 2015-03-21 DIAGNOSIS — Z1159 Encounter for screening for other viral diseases: Secondary | ICD-10-CM

## 2015-03-21 DIAGNOSIS — E7800 Pure hypercholesterolemia, unspecified: Secondary | ICD-10-CM

## 2015-03-21 DIAGNOSIS — Z0000 Encounter for general adult medical examination without abnormal findings: Secondary | ICD-10-CM | POA: Diagnosis not present

## 2015-03-21 DIAGNOSIS — Z09 Encounter for follow-up examination after completed treatment for conditions other than malignant neoplasm: Secondary | ICD-10-CM

## 2015-03-21 DIAGNOSIS — M542 Cervicalgia: Secondary | ICD-10-CM | POA: Diagnosis not present

## 2015-03-21 LAB — LIPID PANEL
Cholesterol: 229 mg/dL — ABNORMAL HIGH (ref 0–200)
HDL: 63.3 mg/dL (ref >39.00)
LDL Cholesterol: 141 mg/dL — ABNORMAL HIGH (ref 0–99)
NonHDL: 165.48
Total CHOL/HDL Ratio: 4
Triglycerides: 121 mg/dL (ref 0.0–149.0)
VLDL: 24.2 mg/dL (ref 0.0–40.0)

## 2015-03-21 LAB — TSH: TSH: 0.87 u[IU]/mL (ref 0.35–4.50)

## 2015-03-21 LAB — BASIC METABOLIC PANEL
BUN: 19 mg/dL (ref 6–23)
CO2: 31 mEq/L (ref 19–32)
Calcium: 9.9 mg/dL (ref 8.4–10.5)
Chloride: 104 mEq/L (ref 96–112)
Creatinine, Ser: 0.95 mg/dL (ref 0.40–1.20)
GFR: 62.23 mL/min (ref >60.00)
Glucose, Bld: 92 mg/dL (ref 70–99)
Potassium: 4.4 mEq/L (ref 3.5–5.1)
Sodium: 141 mEq/L (ref 135–145)

## 2015-03-21 MED ORDER — PREDNISONE 10 MG PO TABS: ORAL_TABLET | ORAL | Status: DC

## 2015-03-21 NOTE — Patient Instructions (Signed)
Get your blood work before you leave   For  neck pain: Take prednisone as prescribed Tylenol  500 mg OTC 2 tabs a day every 8 hours as needed for pain If not improving in the next 2 or 3 weeks: Prescribed for a referral to physical therapy  Call if the palpitations are not improving in the next few weeks     Next visit  for a cecal exam in one year, fasting Please schedule an appointment at the front desk

## 2015-03-21 NOTE — Progress Notes (Signed)
Pre visit review using our clinic review tool, if applicable. No additional management support is needed unless otherwise documented below in the visit note. 

## 2015-03-21 NOTE — Progress Notes (Signed)
Subjective:    Patient ID: Valerie Long, female    DOB: Jul 09, 1945, 69 y.o.   MRN: [id number]  DOS:  03/21/2015 Type of visit - description : CPX Interval history: In general feeling well but has a few issues, see below    Review of Systems Constitutional: No fever. No chills. No unexplained wt changes. No unusual sweats  HEENT: No dental problems, no ear discharge, no facial swelling, no voice changes. No eye discharge, no eye  redness , no  intolerance to light   Respiratory: No wheezing , no  difficulty breathing. No cough , no mucus production  Cardiovascular: No CP, no leg swelling . This year had a few episodes of palpitations at nighttime, very infrequent, last about a minute, no associated chest pain, nausea or diaphoresis. Denies anxiety.  GI: no nausea, no vomiting, no diarrhea , no  abdominal pain.  No blood in the stools. No dysphagia, no odynophagia    Endocrine: No polyphagia, no polyuria , no polydipsia  GU: No dysuria, gross hematuria, difficulty urinating. No urinary urgency, no frequency.  Musculoskeletal: Back in May went to a dentist, kept the neck in the same position for a while, shortly after developed posterior neck pain without radiation mostly when she extends her neck. Went to [facility] urgent care, x-rays were done of the neck and shoulders, prescribed Robaxin and prednisone, not helping much, still have some pain. Denies headaches.  Skin: No change in the color of the skin, palor , no  Rash  Allergic, immunologic: No environmental allergies , no  food allergies  Neurological: No dizziness no  syncope. No headaches. No diplopia, no slurred, no slurred speech, no motor deficits, no facial  Numbness  Hematological: No enlarged lymph nodes, no easy bruising , no unusual bleedings  Psychiatry: No suicidal ideas, no hallucinations, no beavior problems, no confusion.  No unusual/severe anxiety, no depression   Past Medical History  Diagnosis Date  .  Osteopenia   . Hypercholesterolemia   . 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 Cesarean section      myomectomy    Social History   Social History  . Marital Status: Legally Separated    Spouse Name: N/A  . Number of Children: 1  . Years of Education: N/A   Occupational History  . 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 Smoking status: Never Smoker   . Smokeless tobacco: Never Used  . Alcohol Use: Yes     Comment: SOCIAL  . Drug Use: No  . Sexual Activity: No   Other Topics Concern  . Not on file   Social History Narrative   From [place], moved from [place]  to [place] 2008   Lives by herself   Diet- try to eat healthy   Exercise- usually 3-4 / week, walks , some weights   Family History  Problem Relation Age of Onset  . Diabetes Other     auncle-aunts   . Stroke Neg Hx   . Hypertension Mother     F and M  . Cancer Other     UTERINE   . CAD Father   . Breast cancer Neg Hx   . Colon cancer Neg Hx         Medication List       This list is accurate as of: 03/21/15 11:59 PM.  Always use your most recent med list.               aspirin 325 MG tablet  Take 650 mg by mouth daily as needed for moderate pain or headache (headache and pain).     predniSONE 10 MG tablet  Commonly known as:  DELTASONE  4 tablets x 2 days, 3 tabs x 2 days, 2 tabs x 2 days, 1 tab x 2 days           Objective:   Physical Exam BP 116/70 mmHg  Pulse 72  Temp(Src) 97.7 F (36.5 C) (Oral)  Ht 5' 4.25" (1.632 m)  Wt 156 lb 2 oz (70.818 kg)  BMI 26.59 kg/m2  SpO2 99%  LMP 06/09/1998 General:   Well developed, well nourished . NAD.  Neck:  Full range of motion. Supple. No  thyromegaly , no TTP at the cervical spine HEENT:  Normocephalic . Face symmetric, atraumatic Lungs:  CTA B Normal respiratory effort, no intercostal retractions, no accessory muscle use. Heart: RRR,  no  murmur.  No pretibial edema bilaterally  Abdomen:  Not distended, soft, non-tender. No rebound or rigidity.   Skin: Exposed areas without rash. Not pale. Not jaundice Neurologic:  alert & oriented X3.  Speech normal, gait appropriate for age and unassisted Strength symmetric and appropriate for age. DTRs symmetric Psych: Cognition and judgment appear intact.  Cooperative with normal attention span and concentration.  Behavior appropriate. No anxious or depressed appearing.    Assessment & Plan:   Assessment > Hyperlipidemia Osteopenia Left pelvic kidney -- by CT done at [place]  Plan  Palpitations: EKG : Sinus rhythm, no change compared to the previous EKG.labs. Patient will let me know if not better in the next few months, if symptoms severe or associated with other problems. Neck pain: Reportedly had x-rays done in May, Robaxin and prednisone did not help. Recommend a second round of prednisone, Tylenol. If not better will call for a PT referral

## 2015-03-21 NOTE — Assessment & Plan Note (Addendum)
Tdap 2008 per pt Shingles shot - 12-2013 prevnar- today Flu shot-- declined, benefits discussed   Cscope , Dr Collene Mares [phone], 1 polyp, next per GI   Female care per gyn, last PAP N1-2013, last gyn visit 2015. MMG neg 10-2013 DEXA--  per Dr Toney Rakes 10-2013 Diet-exercise discussed Labs : BMP, FLP, TSH, hep C EKG nsr

## 2015-03-22 DIAGNOSIS — Z09 Encounter for follow-up examination after completed treatment for conditions other than malignant neoplasm: Secondary | ICD-10-CM | POA: Insufficient documentation

## 2015-03-22 LAB — HEPATITIS C ANTIBODY: HCV Ab: NEGATIVE

## 2015-03-22 NOTE — Assessment & Plan Note (Signed)
Palpitations: EKG : Sinus rhythm, no change compared to the previous EKG.labs. Patient will let me know if not better in the next few months, if symptoms severe or associated with other problems. Neck pain: Reportedly had x-rays done in May, Robaxin and prednisone did not help. Recommend a second round of prednisone, Tylenol. If not better will call for a PT referral

## 2015-06-08 DIAGNOSIS — C3491 Malignant neoplasm of unspecified part of right bronchus or lung: Secondary | ICD-10-CM

## 2015-06-08 HISTORY — DX: Malignant neoplasm of unspecified part of right bronchus or lung: C34.91

## 2015-07-08 ENCOUNTER — Encounter: Payer: Self-pay | Admitting: Internal Medicine

## 2015-07-08 ENCOUNTER — Telehealth: Payer: Self-pay | Admitting: Internal Medicine

## 2015-07-08 ENCOUNTER — Ambulatory Visit (HOSPITAL_BASED_OUTPATIENT_CLINIC_OR_DEPARTMENT_OTHER)
Admission: RE | Admit: 2015-07-08 | Discharge: 2015-07-08 | Disposition: A | Discharge status: Home / Self Care | Payer: BLUE CROSS/BLUE SHIELD | Source: Ambulatory Visit | Attending: Internal Medicine | Admitting: Internal Medicine

## 2015-07-08 ENCOUNTER — Ambulatory Visit (INDEPENDENT_AMBULATORY_CARE_PROVIDER_SITE_OTHER): Payer: Medicare Other | Admitting: Internal Medicine

## 2015-07-08 VITALS — BP 120/78 | HR 79 | Temp 97.8°F | Ht 64.25 in | Wt 157.2 lb

## 2015-07-08 DIAGNOSIS — R05 Cough: Secondary | ICD-10-CM | POA: Insufficient documentation

## 2015-07-08 DIAGNOSIS — J189 Pneumonia, unspecified organism: Secondary | ICD-10-CM

## 2015-07-08 DIAGNOSIS — R059 Cough, unspecified: Secondary | ICD-10-CM

## 2015-07-08 DIAGNOSIS — R918 Other nonspecific abnormal finding of lung field: Secondary | ICD-10-CM | POA: Insufficient documentation

## 2015-07-08 MED ORDER — BUDESONIDE-FORMOTEROL FUMARATE 160-4.5 MCG/ACT IN AERO: 2.0000 | INHALATION_SPRAY | Freq: Two times a day (BID) | RESPIRATORY_TRACT | Status: DC

## 2015-07-08 MED ORDER — DOXYCYCLINE HYCLATE 100 MG PO TABS: 100.0000 mg | ORAL_TABLET | Freq: Two times a day (BID) | ORAL | Status: DC

## 2015-07-08 NOTE — Patient Instructions (Signed)
Get the XR at the first floor   ----  Rest, fluids , tylenol  For cough:  Take Mucinex DM twice a day as needed until better Symbicort 2 sprays twice a day x 4 weeks  Pro-air if the cough -wheezing persists    Take the antibiotic as prescribed  (doxycycline )  Call if not gradually better over the next  10 days  Call anytime if the symptoms are severe

## 2015-07-08 NOTE — Telephone Encounter (Signed)
Relation to KG:URKY Call back [phone]   Reason for call:  Patient inquiring about DG CHEST 2 VIEW results advised patient will call when results are in.

## 2015-07-08 NOTE — Telephone Encounter (Signed)
Pt calling regarding chest x-ray results. Please advise.

## 2015-07-08 NOTE — Progress Notes (Signed)
Subjective:    Patient ID: Valerie Long, female    DOB: 04/14/1946, 70 y.o.   MRN: [id number]  DOS:  07/08/2015 Type of visit - description : Acute visit Interval history: Symptoms started approximately 6 weeks ago with a URI >>> cough, chest congestion, wheezing. Was prescribed clarithromycin and albuterol at the urgent care. Cough improved but never went away. Continue with on and off cough, wheezing sometimes, whitish sputum production.   Review of Systems Denies fever or chills No sinus pain or congestion No nausea or vomiting No history of asthma, not a smoker  Past Medical History  Diagnosis Date  . Osteopenia   . Hypercholesterolemia   . 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 Cesarean section      myomectomy    Social History   Social History  . Marital Status: Legally Separated    Spouse Name: N/A  . Number of Children: 1  . Years of Education: N/A   Occupational History  . 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 Smoking status: Never Smoker   . Smokeless tobacco: Never Used  . Alcohol Use: Yes     Comment: SOCIAL  . Drug Use: No  . Sexual Activity: No   Other Topics Concern  . Not on file   Social History Narrative   From [place], moved from [place]  to [place] 2008   Lives by herself   Diet- try to eat healthy   Exercise- usually 3-4 / week, walks , some weights        Medication List       This list is accurate as of: 07/08/15  6:29 PM.  Always use your most recent med list.               albuterol 108 (90 Base) MCG/ACT inhaler  Commonly known as:  PROVENTIL HFA;VENTOLIN HFA  Inhale 1-2 puffs into the lungs every 6 (six) hours as needed for wheezing or shortness of breath.     aspirin 325 MG tablet  Take 650 mg by mouth daily as needed for moderate pain or headache (headache and pain).     budesonide-formoterol 160-4.5 MCG/ACT inhaler  Commonly  known as:  SYMBICORT  Inhale 2 puffs into the lungs 2 (two) times daily.     doxycycline 100 MG tablet  Commonly known as:  VIBRA-TABS  Take 1 tablet (100 mg total) by mouth 2 (two) times daily.           Objective:   Physical Exam BP 120/78 mmHg  Pulse 79  Temp(Src) 97.8 F (36.6 C) (Oral)  Ht 5' 4.25" (1.632 m)  Wt 157 lb 4 oz (71.328 kg)  BMI 26.78 kg/m2  SpO2 97%  LMP 06/09/1998 General:   Well developed, well nourished . NAD.  HEENT:  Normocephalic . Face symmetric, atraumatic. Left TM normal right TM obscured by wax. Throat: Symmetric, no red, no discharge, tonsils not seen. Sinuses not TTP Lungs:  CTA B Normal respiratory effort, no intercostal retractions, no accessory muscle use. Heart: RRR,  no murmur.  No pretibial edema bilaterally  Skin: Not pale. Not jaundice Neurologic:  alert & oriented X3.  Speech normal, gait appropriate for age and unassisted Psych--  Cognition and judgment appear intact.  Cooperative with normal attention span and concentration.  Behavior appropriate. No anxious or depressed appearing.  Assessment & Plan:   Assessment > Hyperlipidemia Osteopenia Left pelvic kidney -- by CT done at [place]   PLAN: Cough: Persisting cough for 6 weeks, wheezing by history. Exam benign today. No history of asthma or tobacco. Plan: Chest x-ray, doxycycline, Symbicort for one month, call if not improving. Addendum: X-ray showed pneumonia, patient aware, plan is the same

## 2015-07-08 NOTE — Progress Notes (Signed)
Pre visit review using our clinic review tool, if applicable. No additional management support is needed unless otherwise documented below in the visit note. 

## 2015-07-08 NOTE — Telephone Encounter (Signed)
Chest x-ray showed pneumonia, plan is the same with doxycycline and Symbicort. Patient aware she needs to come back in a month for a checkup and a follow-up chest x-ray.

## 2015-07-09 ENCOUNTER — Telehealth: Payer: Self-pay | Admitting: Internal Medicine

## 2015-07-09 NOTE — Telephone Encounter (Signed)
Patient concerned because sometimes when she coughs sees drops of red blood, symptoms started a couple of days ago. She is on antibiotics, overall feels better today, no chest pain. Recommend to continue with antibiotics, if he has  abundant hemoptysis or not gradually improving she needs to let me know otherwise follow-up as recommended

## 2015-07-09 NOTE — Telephone Encounter (Signed)
Caller name: Valerie Long Relation to pt: self  Call back number: [phone] Pharmacy:  Reason for call: Pt was called to schedule fu appt for pneumonia for August 06, 2015 but pt would like to speak with PCP about her treatment and stated was having some additional issues. Please advise.

## 2015-07-09 NOTE — Telephone Encounter (Signed)
Call transferred to Dr. Larose Kells.

## 2015-07-18 ENCOUNTER — Ambulatory Visit (INDEPENDENT_AMBULATORY_CARE_PROVIDER_SITE_OTHER): Payer: BLUE CROSS/BLUE SHIELD | Admitting: Internal Medicine

## 2015-07-18 ENCOUNTER — Encounter: Payer: Self-pay | Admitting: Internal Medicine

## 2015-07-18 VITALS — BP 122/64 | HR 79 | Temp 97.9°F | Ht 64.25 in | Wt 156.5 lb

## 2015-07-18 DIAGNOSIS — J189 Pneumonia, unspecified organism: Secondary | ICD-10-CM | POA: Diagnosis not present

## 2015-07-18 MED ORDER — MOXIFLOXACIN HCL 400 MG PO TABS: 400.0000 mg | ORAL_TABLET | Freq: Every day | ORAL | Status: DC

## 2015-07-18 NOTE — Progress Notes (Signed)
Pre visit review using our clinic review tool, if applicable. No additional management support is needed unless otherwise documented below in the visit note. 

## 2015-07-18 NOTE — Progress Notes (Signed)
Subjective:    Patient ID: Valerie Long, female    DOB: [birth year], 70 y.o.   MRN: [id number]  DOS:  07/18/2015 Type of visit - description : Follow-up Interval history: Since the last office visit, took  doxycycline. Cough is slightly better but she still continue coughing "blood" described as whitish sputum with reddish/pink blood  Review of Systems  No fever chills Continue with mild wheezing, using inhalers as recommended No nausea, vomiting, diarrhea.  Past Medical History  Diagnosis Date  . Osteopenia   . Hypercholesterolemia   . 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 Cesarean section      myomectomy    Social History   Social History  . Marital Status: Legally Separated    Spouse Name: N/A  . Number of Children: 1  . Years of Education: N/A   Occupational History  . 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 Smoking status: Never Smoker   . Smokeless tobacco: Never Used  . Alcohol Use: Yes     Comment: SOCIAL  . Drug Use: No  . Sexual Activity: No   Other Topics Concern  . Not on file   Social History Narrative   From [place], moved from [place]  to [place] 2008   Lives by herself   Diet- try to eat healthy   Exercise- usually 3-4 / week, walks , some weights        Medication List       This list is accurate as of: 07/18/15 10:11 AM.  Always use your most recent med list.               albuterol 108 (90 Base) MCG/ACT inhaler  Commonly known as:  PROVENTIL HFA;VENTOLIN HFA  Inhale 1-2 puffs into the lungs every 6 (six) hours as needed for wheezing or shortness of breath.     aspirin 325 MG tablet  Take 650 mg by mouth daily as needed for moderate pain or headache (headache and pain).     budesonide-formoterol 160-4.5 MCG/ACT inhaler  Commonly known as:  SYMBICORT  Inhale 2 puffs into the lungs 2 (two) times daily.     moxifloxacin 400 MG tablet    Commonly known as:  AVELOX  Take 1 tablet (400 mg total) by mouth daily.           Objective:   Physical Exam BP 122/64 mmHg  Pulse 79  Temp(Src) 97.9 F (36.6 C) (Oral)  Ht 5' 4.25" (1.632 m)  Wt 156 lb 8 oz (70.988 kg)  BMI 26.65 kg/m2  SpO2 97%  LMP 06/09/1998 General:   Well developed, well nourished . NAD.  HEENT:  Normocephalic . Face symmetric, atraumatic Lungs:  CTA B Normal respiratory effort, no intercostal retractions, no accessory muscle use. Heart: RRR,  no murmur.  No pretibial edema bilaterally  Skin: Not pale. Not jaundice Neurologic:  alert & oriented X3.  Speech normal, gait appropriate for age and unassisted Psych--  Cognition and judgment appear intact.  Cooperative with normal attention span and concentration.  Behavior appropriate. No anxious or depressed appearing.      Assessment & Plan:   Assessment > Hyperlipidemia Osteopenia Left pelvic kidney -- by CT done at [place]   PLAN 3-50-09  Community-acquired pneumonia: RLL, ?RML S/p doxycycline, still coughing rusty sputum. Nontoxic. She is somewhat concerned about the ongoing  hemoptysis. Will prescribe Avelox, chest x-ray in one month. Call if fever, chills or ongoing hemoptysis. Instructions provided in Spanish.    Previous plans PLAN 1-30 06-2015  Cough: Persisting cough for 6 weeks, wheezing by history. Exam benign today. No history of asthma or tobacco. Plan: Chest x-ray, doxycycline, Symbicort for one month, call if not improving. Addendum: X-ray showed pneumonia, patient aware, plan is the same  Plan-14-2016 Palpitations: EKG : Sinus rhythm, no change compared to the previous EKG.labs. Patient will let me know if not better in the next few months, if symptoms severe or associated with other problems. Neck pain: Reportedly had x-rays done in May, Robaxin and prednisone did not help. Recommend a second round of prednisone, Tylenol. If not better will call for a PT  referral

## 2015-07-18 NOTE — Patient Instructions (Addendum)
TOME AVELOX  X 7 DIAS  CONTINUE MUCINEX DM  CONTINUE SUS INHALADORES  TOMESE LA RADIOGRAFIA EN 1 MES, VENGA AL PRIMER PISO, NO NECESITA  APPOINTMENT   SI TIENE FIEBRES, SI LA SANGRE CUANDO TOSE NO SE VA EN 2 SEMANAS >>> LLAME A LA OFICINA

## 2015-08-04 ENCOUNTER — Ambulatory Visit (HOSPITAL_BASED_OUTPATIENT_CLINIC_OR_DEPARTMENT_OTHER)
Admission: RE | Admit: 2015-08-04 | Discharge: 2015-08-04 | Disposition: A | Discharge status: Home / Self Care | Payer: BLUE CROSS/BLUE SHIELD | Source: Ambulatory Visit | Attending: Internal Medicine | Admitting: Internal Medicine

## 2015-08-04 DIAGNOSIS — R05 Cough: Secondary | ICD-10-CM | POA: Diagnosis not present

## 2015-08-04 DIAGNOSIS — J189 Pneumonia, unspecified organism: Secondary | ICD-10-CM | POA: Diagnosis not present

## 2015-08-05 ENCOUNTER — Other Ambulatory Visit: Payer: Self-pay | Admitting: Internal Medicine

## 2015-08-05 DIAGNOSIS — R9389 Abnormal findings on diagnostic imaging of other specified body structures: Secondary | ICD-10-CM

## 2015-08-06 ENCOUNTER — Other Ambulatory Visit: Payer: Self-pay

## 2015-08-06 ENCOUNTER — Ambulatory Visit: Payer: Medicare Other | Admitting: Internal Medicine

## 2015-08-06 DIAGNOSIS — J189 Pneumonia, unspecified organism: Secondary | ICD-10-CM

## 2015-08-07 ENCOUNTER — Ambulatory Visit (HOSPITAL_BASED_OUTPATIENT_CLINIC_OR_DEPARTMENT_OTHER)
Admission: RE | Admit: 2015-08-07 | Discharge: 2015-08-07 | Disposition: A | Discharge status: Home / Self Care | Payer: BLUE CROSS/BLUE SHIELD | Source: Ambulatory Visit | Attending: Internal Medicine | Admitting: Internal Medicine

## 2015-08-07 ENCOUNTER — Encounter (HOSPITAL_BASED_OUTPATIENT_CLINIC_OR_DEPARTMENT_OTHER): Payer: Self-pay

## 2015-08-07 ENCOUNTER — Other Ambulatory Visit (INDEPENDENT_AMBULATORY_CARE_PROVIDER_SITE_OTHER): Payer: BLUE CROSS/BLUE SHIELD

## 2015-08-07 DIAGNOSIS — J189 Pneumonia, unspecified organism: Secondary | ICD-10-CM | POA: Diagnosis not present

## 2015-08-07 DIAGNOSIS — J984 Other disorders of lung: Secondary | ICD-10-CM | POA: Diagnosis not present

## 2015-08-07 DIAGNOSIS — R938 Abnormal findings on diagnostic imaging of other specified body structures: Secondary | ICD-10-CM | POA: Insufficient documentation

## 2015-08-07 DIAGNOSIS — R9389 Abnormal findings on diagnostic imaging of other specified body structures: Secondary | ICD-10-CM

## 2015-08-07 DIAGNOSIS — R918 Other nonspecific abnormal finding of lung field: Secondary | ICD-10-CM | POA: Diagnosis not present

## 2015-08-07 LAB — BASIC METABOLIC PANEL
BUN: 18 mg/dL (ref 6–23)
CO2: 32 mEq/L (ref 19–32)
Calcium: 9.7 mg/dL (ref 8.4–10.5)
Chloride: 104 mEq/L (ref 96–112)
Creatinine, Ser: 1.01 mg/dL (ref 0.40–1.20)
GFR: 57.92 mL/min — ABNORMAL LOW (ref >60.00)
Glucose, Bld: 85 mg/dL (ref 70–99)
Potassium: 4.3 mEq/L (ref 3.5–5.1)
Sodium: 140 mEq/L (ref 135–145)

## 2015-08-07 MED ORDER — IOHEXOL 300 MG/ML  SOLN
80.0000 mL | Freq: Once | INTRAMUSCULAR | Status: AC | PRN
  Administered 2015-08-07: 80 mL via INTRAVENOUS

## 2015-08-08 ENCOUNTER — Other Ambulatory Visit: Payer: Self-pay | Admitting: Internal Medicine

## 2015-08-08 DIAGNOSIS — R9389 Abnormal findings on diagnostic imaging of other specified body structures: Secondary | ICD-10-CM

## 2015-08-14 ENCOUNTER — Encounter: Payer: Self-pay | Admitting: Pulmonary Disease

## 2015-08-14 ENCOUNTER — Ambulatory Visit (INDEPENDENT_AMBULATORY_CARE_PROVIDER_SITE_OTHER): Payer: BLUE CROSS/BLUE SHIELD | Admitting: Pulmonary Disease

## 2015-08-14 VITALS — BP 116/84 | HR 96 | Ht 63.5 in | Wt 155.0 lb

## 2015-08-14 DIAGNOSIS — R918 Other nonspecific abnormal finding of lung field: Secondary | ICD-10-CM

## 2015-08-14 NOTE — Patient Instructions (Signed)
Bronchoscopy scheduled for Tuesday 3/14 at 7-30 A PET scan will be scheduled

## 2015-08-14 NOTE — Progress Notes (Signed)
Subjective:    Patient ID: Valerie Long, female    DOB: 02-29-1946, 70 y.o.   MRN: [id number]  HPI Chief Complaint  Patient presents with  . pulmonary consult    Referred by Dr. Larose Kells; recurrent bronchitis, pneumonia.    70 year old never smoker referred for evaluation of abnormal imaging studies She received a pneumonia and flu shot in 03/2015 and feels that she felt sick afterwards. She was diagnosed with bronchitis with cough in December and seen at urgent care. She was then seen in January and February and received a total of 3 rounds of antibiotics. She still reports an occasional dry cough, no fevers or wheezing. Chest x-ray 1/31 showed right middle lobe and lower lobe infiltrate. Repeat from 2/27 showed improved but persistent right lower lobe infiltrate CT chest from 08/07/15 showed spiculated appearing mass lesion in the right lower lobe with peripheral postobstructive consolidation. 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 There was 1.4 cm groundglass nodule in the right lower lobe with right middle lobe and lingular bronchiectasis  PET scan from 2010 showed right lower lobe nodule was hypermetabolic, mediastinal mass was not, there was focal activity at T12 spinal canal  Past Medical History  Diagnosis Date  . Osteopenia   . Hypercholesterolemia   . 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 Cesarean section      myomectomy    No Known Allergies Social History   Social History  . Marital Status: Legally Separated    Spouse Name: N/A  . Number of Children: 1  . Years of Education: N/A   Occupational  History  . 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 Smoking status: Never Smoker   . Smokeless tobacco: Never Used  . Alcohol Use: Yes     Comment: SOCIAL  . Drug Use: No  . Sexual Activity: No   Other Topics Concern  . Not on file   Social History Narrative   From [place], moved from [place]  to [place] 2008   Lives by herself   Diet- try to eat healthy   Exercise- usually 3-4 / week, walks , some weights    Family History  Problem Relation Age of Onset  . Diabetes Other     auncle-aunts   . Stroke Neg Hx   . Hypertension Mother     F and M  . Cancer Other     UTERINE   . CAD Father   . Breast cancer Neg Hx   . Colon cancer Neg Hx      Review of Systems  Constitutional: Negative for fever, chills and unexpected weight change.  HENT: Negative for congestion, dental problem, ear pain, nosebleeds, postnasal drip, rhinorrhea, sinus pressure, sneezing, sore throat, trouble swallowing and voice change.   Eyes: Negative for visual disturbance.  Respiratory: Positive for cough and wheezing. Negative for choking and shortness of breath.   Cardiovascular: Negative for chest pain and leg swelling.  Gastrointestinal: Negative for vomiting, abdominal pain and diarrhea.  Genitourinary: Negative for difficulty urinating.  Musculoskeletal: Negative for arthralgias.  Skin: Negative for rash.  Neurological: Negative for tremors, syncope and headaches.  Hematological: Does not bruise/bleed easily.       Objective:   Physical Exam  Gen. Pleasant, well-nourished, in no distress, normal affect ENT - no lesions, no post nasal drip Neck: No JVD, no thyromegaly, no carotid bruits Lungs: no use of accessory muscles, no dullness to percussion, clear without rales or rhonchi  Cardiovascular: Rhythm regular, heart sounds  normal, no murmurs or gallops, no peripheral edema Abdomen: soft and non-tender, no hepatosplenomegaly, BS normal. Musculoskeletal: No deformities, no  cyanosis or clubbing Neuro:  alert, non focal       Assessment & Plan:

## 2015-08-14 NOTE — Assessment & Plan Note (Addendum)
Given review of prior CT from 2010, this is concerning for slow-growing right lower lobe lesion. Surprisingly the mediastinal mass is not noted on this scan. We'll obtain a PET scan for completion  Bronchoscopy scheduled for Tuesday 3/14 at 7-30 A PET scan will be scheduled  The various options of biopsy including bronchoscopy, CT guided needle aspiration and surgical biopsy were discussed.The risks of each procedure including coughing, bleeding and the  chances of lung puncture requiring chest tube were discussed in great detail. The benefits & alternatives including serial follow up were also discussed.

## 2015-08-19 ENCOUNTER — Ambulatory Visit (HOSPITAL_COMMUNITY)
Admission: RE | Admit: 2015-08-19 | Discharge: 2015-08-19 | Disposition: A | Discharge status: Home / Self Care | Payer: BLUE CROSS/BLUE SHIELD | Source: Ambulatory Visit | Attending: Pulmonary Disease | Admitting: Pulmonary Disease

## 2015-08-19 ENCOUNTER — Encounter (HOSPITAL_COMMUNITY)
Admission: RE | Disposition: A | Discharge status: Home / Self Care | Payer: Self-pay | Source: Ambulatory Visit | Attending: Pulmonary Disease

## 2015-08-19 ENCOUNTER — Ambulatory Visit (HOSPITAL_COMMUNITY): Payer: BLUE CROSS/BLUE SHIELD

## 2015-08-19 DIAGNOSIS — Z9889 Other specified postprocedural states: Secondary | ICD-10-CM

## 2015-08-19 DIAGNOSIS — J40 Bronchitis, not specified as acute or chronic: Secondary | ICD-10-CM | POA: Diagnosis present

## 2015-08-19 DIAGNOSIS — R918 Other nonspecific abnormal finding of lung field: Secondary | ICD-10-CM | POA: Diagnosis not present

## 2015-08-19 DIAGNOSIS — C3491 Malignant neoplasm of unspecified part of right bronchus or lung: Secondary | ICD-10-CM | POA: Insufficient documentation

## 2015-08-19 DIAGNOSIS — R942 Abnormal results of pulmonary function studies: Secondary | ICD-10-CM | POA: Diagnosis not present

## 2015-08-19 DIAGNOSIS — E7800 Pure hypercholesterolemia, unspecified: Secondary | ICD-10-CM | POA: Insufficient documentation

## 2015-08-19 HISTORY — PX: VIDEO BRONCHOSCOPY: SHX5072

## 2015-08-19 SURGERY — BRONCHOSCOPY, WITH FLUOROSCOPY
Anesthesia: Moderate Sedation | Laterality: Bilateral

## 2015-08-19 MED ORDER — MIDAZOLAM HCL 5 MG/ML IJ SOLN
INTRAMUSCULAR | Status: AC
  Filled 2015-08-19: qty 2

## 2015-08-19 MED ORDER — FENTANYL CITRATE (PF) 100 MCG/2ML IJ SOLN
INTRAMUSCULAR | Status: AC
  Filled 2015-08-19: qty 4

## 2015-08-19 MED ORDER — LIDOCAINE HCL 2 % EX GEL
CUTANEOUS | Status: DC | PRN
  Administered 2015-08-19: 1

## 2015-08-19 MED ORDER — FENTANYL CITRATE (PF) 100 MCG/2ML IJ SOLN
25.0000 ug | Freq: Once | INTRAMUSCULAR | Status: DC | PRN
  Administered 2015-08-19 (×4): 25 ug via INTRAVENOUS

## 2015-08-19 MED ORDER — MIDAZOLAM HCL 10 MG/2ML IJ SOLN
INTRAMUSCULAR | Status: DC | PRN
  Administered 2015-08-19 (×3): 1 mg via INTRAVENOUS

## 2015-08-19 MED ORDER — LIDOCAINE HCL (PF) 1 % IJ SOLN
INTRAMUSCULAR | Status: DC | PRN
  Administered 2015-08-19: 6 mL

## 2015-08-19 MED ORDER — PHENYLEPHRINE HCL 0.25 % NA SOLN
NASAL | Status: DC | PRN
  Administered 2015-08-19: 2 via NASAL

## 2015-08-19 NOTE — H&P (View-Only) (Signed)
Subjective:    Patient ID: Valerie Long, female    DOB: 10-Oct-1945, 70 y.o.   MRN: [id number]  HPI Chief Complaint  Patient presents with  . pulmonary consult    Referred by Dr. Larose Kells; recurrent bronchitis, pneumonia.    70 year old never smoker referred for evaluation of abnormal imaging studies She received a pneumonia and flu shot in 03/2015 and feels that she felt sick afterwards. She was diagnosed with bronchitis with cough in December and seen at urgent care. She was then seen in January and February and received a total of 3 rounds of antibiotics. She still reports an occasional dry cough, no fevers or wheezing. Chest x-ray 1/31 showed right middle lobe and lower lobe infiltrate. Repeat from 2/27 showed improved but persistent right lower lobe infiltrate CT chest from 08/07/15 showed spiculated appearing mass lesion in the right lower lobe with peripheral postobstructive consolidation. 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 There was 1.4 cm groundglass nodule in the right lower lobe with right middle lobe and lingular bronchiectasis  PET scan from 2010 showed right lower lobe nodule was hypermetabolic, mediastinal mass was not, there was focal activity at T12 spinal canal  Past Medical History  Diagnosis Date  . Osteopenia   . Hypercholesterolemia   . 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 Cesarean section      myomectomy    No Known Allergies Social History   Social History  . Marital Status: Legally Separated    Spouse Name: N/A  . Number of Children: 1  . Years of Education: N/A   Occupational  History  . 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 Smoking status: Never Smoker   . Smokeless tobacco: Never Used  . Alcohol Use: Yes     Comment: SOCIAL  . Drug Use: No  . Sexual Activity: No   Other Topics Concern  . Not on file   Social History Narrative   From [place], moved from [place]  to [place] 2008   Lives by herself   Diet- try to eat healthy   Exercise- usually 3-4 / week, walks , some weights    Family History  Problem Relation Age of Onset  . Diabetes Other     auncle-aunts   . Stroke Neg Hx   . Hypertension Mother     F and M  . Cancer Other     UTERINE   . CAD Father   . Breast cancer Neg Hx   . Colon cancer Neg Hx      Review of Systems  Constitutional: Negative for fever, chills and unexpected weight change.  HENT: Negative for congestion, dental problem, ear pain, nosebleeds, postnasal drip, rhinorrhea, sinus pressure, sneezing, sore throat, trouble swallowing and voice change.   Eyes: Negative for visual disturbance.  Respiratory: Positive for cough and wheezing. Negative for choking and shortness of breath.   Cardiovascular: Negative for chest pain and leg swelling.  Gastrointestinal: Negative for vomiting, abdominal pain and diarrhea.  Genitourinary: Negative for difficulty urinating.  Musculoskeletal: Negative for arthralgias.  Skin: Negative for rash.  Neurological: Negative for tremors, syncope and headaches.  Hematological: Does not bruise/bleed easily.       Objective:   Physical Exam  Gen. Pleasant, well-nourished, in no distress, normal affect ENT - no lesions, no post nasal drip Neck: No JVD, no thyromegaly, no carotid bruits Lungs: no use of accessory muscles, no dullness to percussion, clear without rales or rhonchi  Cardiovascular: Rhythm regular, heart sounds  normal, no murmurs or gallops, no peripheral edema Abdomen: soft and non-tender, no hepatosplenomegaly, BS normal. Musculoskeletal: No deformities, no  cyanosis or clubbing Neuro:  alert, non focal       Assessment & Plan:

## 2015-08-19 NOTE — Progress Notes (Signed)
Video bronchoscopy with washing intervention, biopsy intervention, brushing intervention. All vitals good thru out, report given to Endo RN.

## 2015-08-19 NOTE — Discharge Instructions (Signed)
Flexible Bronchoscopy, Care After These instructions give you information on caring for yourself after your procedure. Your doctor may also give you more specific instructions. Call your doctor if you have any problems or questions after your procedure. HOME CARE  Do not eat or drink anything for 2 hours after your procedure. If you try to eat or drink before the medicine wears off, food or drink could go into your lungs. You could also burn yourself.  After 2 hours have passed and when you can cough and gag normally, you may eat soft food and drink liquids slowly.  The day after the test, you may eat your normal diet.  You may do your normal activities.  Keep all doctor visits. GET HELP RIGHT AWAY IF:  You get more and more short of breath.  You get light-headed.  You feel like you are going to pass out (faint).  You have chest pain.  You have new problems that worry you.  You cough up more than a little blood.  You cough up more blood than before. MAKE SURE YOU:  Understand these instructions.  Will watch your condition.  Will get help right away if you are not doing well or get worse.   This information is not intended to replace advice given to you by your health care provider. Make sure you discuss any questions you have with your health care provider.   Document Released: 03/21/2009 Document Revised: 05/29/2013 Document Reviewed: 01/26/2013 Elsevier Interactive Patient Education [employer].

## 2015-08-19 NOTE — Interval H&P Note (Signed)
History and Physical Interval Note:  08/19/2015 7:27 AM  Valerie Long  has presented today for surgery, with the diagnosis of Lung mass  The various methods of treatment have been discussed with the patient and family. After consideration of risks, benefits and other options for treatment, the patient has consented to  Procedure(s): VIDEO BRONCHOSCOPY WITH FLUORO (Bilateral) as a surgical intervention .  The patient's history has been reviewed, patient examined, no change in status, stable for surgery.  I have reviewed the patient's chart and labs.  Questions were answered to the patient's satisfaction.     Lamoyne Palencia V.

## 2015-08-20 ENCOUNTER — Encounter (HOSPITAL_COMMUNITY): Payer: Self-pay | Admitting: Pulmonary Disease

## 2015-08-20 LAB — ACID FAST SMEAR (AFB, MYCOBACTERIA): Acid Fast Smear: NEGATIVE

## 2015-08-21 ENCOUNTER — Encounter (HOSPITAL_COMMUNITY)
Admission: RE | Admit: 2015-08-21 | Discharge: 2015-08-21 | Disposition: A | Discharge status: Home / Self Care | Payer: BLUE CROSS/BLUE SHIELD | Source: Ambulatory Visit | Attending: Pulmonary Disease | Admitting: Pulmonary Disease

## 2015-08-21 DIAGNOSIS — R918 Other nonspecific abnormal finding of lung field: Secondary | ICD-10-CM

## 2015-08-21 LAB — GLUCOSE, CAPILLARY: Glucose-Capillary: 84 mg/dL (ref 65–99)

## 2015-08-21 MED ORDER — FLUDEOXYGLUCOSE F - 18 (FDG) INJECTION
8.6400 | Freq: Once | INTRAVENOUS | Status: AC | PRN
  Administered 2015-08-21: 8.64 via INTRAVENOUS

## 2015-08-22 LAB — CULTURE, BAL-QUANTITATIVE W GRAM STAIN
Culture: NORMAL
Special Requests: NORMAL

## 2015-08-22 LAB — CULTURE, BAL-QUANTITATIVE: Colony Count: 6000

## 2015-08-25 ENCOUNTER — Encounter: Payer: Self-pay | Admitting: Pulmonary Disease

## 2015-08-25 ENCOUNTER — Ambulatory Visit (INDEPENDENT_AMBULATORY_CARE_PROVIDER_SITE_OTHER): Payer: BLUE CROSS/BLUE SHIELD | Admitting: Pulmonary Disease

## 2015-08-25 ENCOUNTER — Telehealth: Payer: Self-pay | Admitting: *Deleted

## 2015-08-25 VITALS — BP 110/80 | HR 103 | Ht 63.0 in | Wt 151.8 lb

## 2015-08-25 DIAGNOSIS — C3491 Malignant neoplasm of unspecified part of right bronchus or lung: Secondary | ICD-10-CM

## 2015-08-25 DIAGNOSIS — R918 Other nonspecific abnormal finding of lung field: Secondary | ICD-10-CM

## 2015-08-25 NOTE — Telephone Encounter (Signed)
Oncology Nurse Navigator Documentation  Oncology Nurse Navigator Flowsheets 08/25/2015  Navigator Encounter Type Introductory phone call;Telephone  Treatment Phase Pre-Tx/Tx Discussion  Barriers/Navigation Needs Coordination of Care  Interventions Coordination of Care  Coordination of Care Appts  Acuity Level 1  Acuity Level 1 Initial guidance, education and coordination as needed  Time Spent with Patient 15   I received a referral for Valerie Long today.  I called and scheduled her to be seen on 09/01/15 arrive at 1:45.  She verbalized understanding of appt time and place.

## 2015-08-25 NOTE — Progress Notes (Signed)
   Subjective:    Patient ID: Valerie Long, female    DOB: 29-Jun-1945, 70 y.o.   MRN: [id number]  HPI  70 year old never smoker for FU of lung mass & persistent RLL infiltrate  She presented with persistent right lower lobe infiltrate after a chest cold She had negative mammogram in 10/2013, colonoscopy was normal  Chief Complaint  Patient presents with  . Follow-up    PET scan results   We discussed results of PET scan and bronchoscopy today in great detail  Significant tests/ events  CT chest from 08/07/15 showed spiculated appearing mass lesion in the right lower lobe with peripheral postobstructive consolidation. 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 There was 1.4 cm groundglass nodule in the right lower lobe with right middle lobe and lingular bronchiectasis  PET scan from 2010 showed right lower lobe nodule was hypermetabolic, mediastinal mass was not, there was focal activity at T12 spinal canal  bronchoscopy 08/2012 >> some narrowing in the lateral basal segment of the right lower lobe - brushings & TBBX  PET 7/94 >>Hypermetabolic RLL mass &  nonenlarged thoracic nodes, precarinal, subcarinal & infrahilar, Isolated sclerotic hypermetabolic right pubic bone lesion  Review of Systems neg for any significant sore throat, dysphagia, itching, sneezing, nasal congestion or excess/ purulent secretions, fever, chills, sweats, unintended wt loss, pleuritic or exertional cp, hempoptysis, orthopnea pnd or change in chronic leg swelling. Also denies presyncope, palpitations, heartburn, abdominal pain, nausea, vomiting, diarrhea or change in bowel or urinary habits, dysuria,hematuria, rash, arthralgias, visual complaints, headache, numbness weakness or ataxia.     Objective:   Physical Exam  Gen. Pleasant, well-nourished, in no distress ENT - no lesions, no  post nasal drip Neck: No JVD, no thyromegaly, no carotid bruits Lungs: no use of accessory muscles, no dullness to percussion, clear without rales or rhonchi  Cardiovascular: Rhythm regular, heart sounds  normal, no murmurs or gallops, no peripheral edema Musculoskeletal: No deformities, no cyanosis or clubbing         Assessment & Plan:

## 2015-08-25 NOTE — Patient Instructions (Signed)
Referral to Oncology - Dr Earlie Server We will set up for second biopsy procedure under anesthesia

## 2015-08-26 NOTE — Op Note (Signed)
[facility] Cardiopulmonary Patient Name: Valerie Long Date: 08/19/2015 MRN: [id number] Attending MD: Kara Mead , MD Date of Birth: Oct 24, 1945 CSN: Finalized Age: 70 Admit Type: Outpatient Gender: Female Procedure:            Bronchoscopy Indications:          Abnormal CT scan of chest, Unresolving right lower lobe                        infiltrate Providers:            Kara Mead, MD, Tammie Readling RRT,RCP, Cherre Huger                        RRT, RCP Referring MD:          Medicines:            Lidocaine applied to nares and subglottic space,                        Fentanyl 100 mcg IV, Midazolam 3 mg mg IV Complications:        No immediate complications. Estimated blood loss:                        Minimal Estimated Blood Loss: Estimated blood loss was minimal. Procedure:            Pre-Anesthesia Assessment:                       - A History and Physical has been performed. Patient                        meds and allergies have been reviewed. The risks and                        benefits of the procedure and the sedation options and                        risks were discussed with the patient. All questions                        were answered and informed consent was obtained.                        Patient identification and proposed procedure were                        verified prior to the procedure by the physician in the                        procedure room. Mental Status Examination: alert and                        oriented. CV Examination: normal. ASA Grade Assessment:                        II - A patient with mild systemic disease. After                        reviewing the risks and benefits, the patient was  deemed in satisfactory condition to undergo the                        procedure. The anesthesia plan was to use moderate                        sedation / analgesia (conscious sedation). Immediately                 prior to administration of medications, the patient was                        re-assessed for adequacy to receive sedatives. The                        heart rate, respiratory rate, oxygen saturations, blood                        pressure, adequacy of pulmonary ventilation, and                        response to care were monitored throughout the                        procedure. The physical status of the patient was                        re-assessed after the procedure.                       After obtaining informed consent, the bronchoscope was                        passed under direct vision. Throughout the procedure,                        the patient's blood pressure, pulse, and oxygen                        saturations were monitored continuously. the [id number]                        [id number] scope was introduced through the right nostril                        and advanced to the tracheobronchial tree. The patient                        tolerated the procedure fairly well. Total fluoroscopy                        time was 52 seconds. Scope In: 7:35:14 AM Scope Out: 7:57:25 AM Findings:      Respiratory tract: The larynx is normal. The vocal cords appear normal.       The subglottic space is normal. The trachea is of normal caliber. The       carina is sharp. The entire tracheobronchial tree was examined to at       least the first subsegmental level. Bronchial mucosa and anatomy are       normal; there are no endobronchial lesions and no secretions, except  some narrowing in the lateral basal segment of the right lower lobe.      Transbronchial brushings of a lesion were obtained in the anterior basal       segment of the right lower lobe and in the lateral basal segment of the       right lower lobe and sent for routine cytology. Three samples were       obtained.      Bronchoalveolar lavage was performed in the right lower lobe of the lung       and sent for  cell count, bacterial culture, viral smears & culture, and       fungal & AFB analysis and cytology. The return was blood-tinged.      Transbronchial biopsies were performed in the anterior basal segment of       the right lower lobe and in the lateral basal segment of the right lower       lobe using forceps and sent for histopathology examination. The       procedure was guided by fluoroscopy. Transbronchial biopsy technique was       selected because the sampling site was not visible endoscopically. Four       biopsy passes were performed. Four biopsy samples were obtained. Impression:           - Abnormal CT scan of chest                       - Unresolving right lower lobe infiltrate                       - Transbronchial brushings were obtained.                       - Bronchoalveolar lavage was performed.                       - Transbronchial lung biopsies were performed. Moderate Sedation:      Moderate (conscious) sedation was personally administered by the       endoscopist. The following parameters were monitored: oxygen saturation,       heart rate, blood pressure, and response to care. Total physician       intraservice time was 10 minutes. Recommendation:       - Await test results.                       - Follow up with bronchoscopist as previously scheduled.                       - PET scan in 1 week. Procedure Code(s):    --- Professional ---                       [phone], Bronchoscopy, rigid or flexible, including                        fluoroscopic guidance, when performed; with                        transbronchial lung biopsy(s), single lobe                       93716, Bronchoscopy, rigid or flexible, including  fluoroscopic guidance, when performed; with bronchial                        alveolar lavage                       [phone], Bronchoscopy, rigid or flexible, including                        fluoroscopic guidance, when performed; with  brushing or                        protected brushings                       [id number], Moderate sedation services provided by the same                        physician or other qualified health care professional                        performing the diagnostic or therapeutic service that                        the sedation supports, requiring the presence of an                        independent trained observer to assist in the                        monitoring of the patient's level of consciousness and                        physiological status; initial 15 minutes of                        intraservice time, patient age 44 years or older Diagnosis Code(s):    --- Professional ---                       R91.8, Other nonspecific abnormal finding of lung field                       R93.8, Abnormal findings on diagnostic imaging of other                        specified body structures CPT copyright 2016 American Medical Association. All rights reserved. The codes documented in this report are preliminary and upon coder review may  be revised to meet current compliance requirements. Kara Mead, MD Kara Mead, MD 08/26/2015 8:37:35 AM This report has been signed electronically. Number of Addenda: 0

## 2015-08-26 NOTE — Assessment & Plan Note (Signed)
We discussed results of PET scan and bronchoscopy today in great detail Cytology is suspicious for adenocarcinoma and clinically this would be consistent. Unfortunately not enough tissue was available for definite diagnosis or for markers.  She will need mediastinal staging given the hypermetabolic lymphadenopathy-we will proceed with EBUS under general anesthesia -we will also try to obtain more specimens from the right lower lobe for markers at the same time  She will be referred to oncology in anticipation I discussed the diagnosis with her today-she was tearful but accepting and would like to know more about her prognosis and treatment options

## 2015-08-28 ENCOUNTER — Ambulatory Visit: Payer: BLUE CROSS/BLUE SHIELD | Admitting: Adult Health

## 2015-09-01 ENCOUNTER — Encounter: Payer: Self-pay | Admitting: Internal Medicine

## 2015-09-01 ENCOUNTER — Ambulatory Visit (HOSPITAL_BASED_OUTPATIENT_CLINIC_OR_DEPARTMENT_OTHER): Payer: BLUE CROSS/BLUE SHIELD | Admitting: Internal Medicine

## 2015-09-01 ENCOUNTER — Ambulatory Visit: Payer: Medicare Other

## 2015-09-01 ENCOUNTER — Other Ambulatory Visit (HOSPITAL_BASED_OUTPATIENT_CLINIC_OR_DEPARTMENT_OTHER): Payer: BLUE CROSS/BLUE SHIELD

## 2015-09-01 ENCOUNTER — Telehealth: Payer: Self-pay | Admitting: Internal Medicine

## 2015-09-01 VITALS — BP 132/71 | HR 71 | Temp 98.9°F | Resp 18 | Ht 63.0 in | Wt 150.9 lb

## 2015-09-01 DIAGNOSIS — R918 Other nonspecific abnormal finding of lung field: Secondary | ICD-10-CM | POA: Diagnosis not present

## 2015-09-01 DIAGNOSIS — C3491 Malignant neoplasm of unspecified part of right bronchus or lung: Secondary | ICD-10-CM

## 2015-09-01 LAB — CBC WITH DIFFERENTIAL/PLATELET
BASO%: 1 % (ref 0.0–2.0)
Basophils Absolute: 0.1 10*3/uL (ref 0.0–0.1)
EOS%: 1.9 % (ref 0.0–7.0)
Eosinophils Absolute: 0.1 10*3/uL (ref 0.0–0.5)
HCT: 38.2 % (ref 34.8–46.6)
HGB: 12.5 g/dL (ref 11.6–15.9)
LYMPH%: 27.7 % (ref 14.0–49.7)
MCH: 29.5 pg (ref 25.1–34.0)
MCHC: 32.6 g/dL (ref 31.5–36.0)
MCV: 90.3 fL (ref 79.5–101.0)
MONO#: 0.4 10*3/uL (ref 0.1–0.9)
MONO%: 7.4 % (ref 0.0–14.0)
NEUT#: 3.5 10*3/uL (ref 1.5–6.5)
NEUT%: 62 % (ref 38.4–76.8)
Platelets: 242 10*3/uL (ref 145–400)
RBC: 4.23 10*6/uL (ref 3.70–5.45)
RDW: 14.5 % (ref 11.2–14.5)
WBC: 5.6 10*3/uL (ref 3.9–10.3)
lymph#: 1.5 10*3/uL (ref 0.9–3.3)

## 2015-09-01 LAB — COMPREHENSIVE METABOLIC PANEL
ALT: 12 U/L (ref 0–55)
AST: 20 U/L (ref 5–34)
Albumin: 3.9 g/dL (ref 3.5–5.0)
Alkaline Phosphatase: 78 U/L (ref 40–150)
Anion Gap: 7 mEq/L (ref 3–11)
BUN: 13 mg/dL (ref 7.0–26.0)
CO2: 31 mEq/L — ABNORMAL HIGH (ref 22–29)
Calcium: 9.8 mg/dL (ref 8.4–10.4)
Chloride: 105 mEq/L (ref 98–109)
Creatinine: 1 mg/dL (ref 0.6–1.1)
EGFR: 56 mL/min/{1.73_m2} — ABNORMAL LOW (ref >90)
Glucose: 92 mg/dl (ref 70–140)
Potassium: 4.3 mEq/L (ref 3.5–5.1)
Sodium: 143 mEq/L (ref 136–145)
Total Bilirubin: 0.73 mg/dL (ref 0.20–1.20)
Total Protein: 7.5 g/dL (ref 6.4–8.3)

## 2015-09-01 NOTE — Progress Notes (Signed)
[place] Telephone:([phone]   Fax:([phone]) [phone]  CONSULT NOTE  REFERRING PHYSICIAN: Dr. Kara Mead  REASON FOR CONSULTATION:  70 years old Hispanic female with questionable lung cancer  HPI Valerie Long is a 70 y.o. female a never smoker with past medical history significant only for dyslipidemia, pelvic kidney and osteopenia. The patient mentions that since October 2016 she has been complaining of cough and cold symptoms after receiving a flu and pneumonia vaccine. She was treated with over-the-counter medication with no improvement in her condition. She was evaluated at an urgent care center and treated with a course of antibiotics with no improvement. The patient's for her primary care physician Dr. Larose Kells and chest x-ray on 07/08/2015 showed right lower lobe and possible right middle lobe pneumonia. The patient was treated with another course of antibiotics and repeat chest x-ray on 08/04/2015 showed persistent abnormal density anteriorly in the right lower lobe and possibly medially in the right middle lobe suggesting persistent pneumonia or atelectasis. CT scan of the chest with contrast was performed on 08/07/2015 and it showed a peripheral postobstructive process. The patient changes are highly suspicious for neoplasm. The patient was referred to Dr. Elsworth Soho and on 08/19/2015 she underwent bronchoscopy with biopsy of the right lower lobe central mass. The final cytology of the bronchial washing and brushing (Accession: [id number]) showed highly atypical cells suspicious for adenocarcinoma. A PET scan on 08/21/2015 showed the area of the right lower lobe volume loss and partial collapse with the central portion having a focal hypermetabolism and measured 2.3 cm with SUV max of 8.1. There was also be taking lobe measuring 0.5 cm with SUV max of 6.0 and subcarinal lymph node measuring 0.7 cm with SUV max of 9.3. The PET scan also showed 1.1 cm sclerotic lesion in the right  pubic bone with hypermetabolism in an SUV max of 4.4. Dr. Elsworth Soho kindly referred the patient to me today for evaluation and recommendation regarding treatment of her condition. She is scheduled for repeat bronchoscopy with endobronchial ultrasound and biopsies of the hilar and mediastinal lymph nodes under the care of Dr. Elsworth Soho next week. When seen today the patient is feeling fine with no specific complaints except for intermittent pain in the pelvic area as well as cough productive of whitish sputum. She denied having any significant chest pain, shortness breath or hemoptysis. She denied having any significant weight loss or night sweats. She has occasional headache and no visual changes. She has no nausea, vomiting, diarrhea or constipation. Family history significant for father died from heart attack and mother still alive at age 65 and has congestive heart failure and hypertension. The patient is single and has one son and adopted daughter. She was accompanied today by her sister, Valerie Long. 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 She is [place] She has no history of smoking but drinks alcohol occasionally and no history of drug abuse.  HPI  Past Medical History  Diagnosis Date  . Osteopenia   . Hypercholesterolemia   . 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 Adenocarcinoma of right lung, stage 4 [facility])     Past Surgical History  Procedure Laterality Date  . Cesarean section      myomectomy  . Video bronchoscopy Bilateral 08/19/2015    Procedure: VIDEO BRONCHOSCOPY WITH FLUORO;  Surgeon: Rigoberto Noel, MD;  Location: [place];  Service: Cardiopulmonary;  Laterality: Bilateral;  Family History  Problem Relation Age of Onset  . Diabetes Other     auncle-aunts   . Stroke Neg Hx   . Hypertension Mother     F and M  . Cancer Other     UTERINE   . CAD Father   . Breast cancer Neg Hx   . Colon cancer Neg Hx     Social  History Social History  Substance Use Topics  . Smoking status: Never Smoker   . Smokeless tobacco: Never Used  . Alcohol Use: Yes     Comment: SOCIAL    No Known Allergies  Current Outpatient Prescriptions  Medication Sig Dispense Refill  . aspirin 325 MG tablet Take 650 mg by mouth daily as needed for moderate pain or headache (headache and pain).     No current facility-administered medications for this visit.    Review of Systems  Constitutional: positive for fatigue Eyes: negative Ears, nose, mouth, throat, and face: negative Respiratory: positive for cough and sputum Cardiovascular: negative Gastrointestinal: negative Genitourinary:negative Integument/breast: negative Hematologic/lymphatic: negative Musculoskeletal:negative Neurological: negative Behavioral/Psych: negative Endocrine: negative Allergic/Immunologic: negative  Physical Exam  ERX:VQMGQ, healthy, no distress, well nourished and well developed SKIN: skin color, texture, turgor are normal, no rashes or significant lesions HEAD: Normocephalic, No masses, lesions, tenderness or abnormalities EYES: normal, PERRLA, Conjunctiva are pink and non-injected EARS: External ears normal, Canals clear OROPHARYNX:no exudate, no erythema and lips, buccal mucosa, and tongue normal  NECK: supple, no adenopathy, no JVD LYMPH:  no palpable lymphadenopathy, no hepatosplenomegaly BREAST:not examined LUNGS: clear to auscultation , and palpation HEART: regular rate & rhythm, no murmurs and no gallops ABDOMEN:abdomen soft, non-tender, normal bowel sounds and no masses or organomegaly BACK: Back symmetric, no curvature., No CVA tenderness EXTREMITIES:no joint deformities, effusion, or inflammation, no edema, no skin discoloration  NEURO: alert & oriented x 3 with fluent speech, no focal motor/sensory deficits  PERFORMANCE STATUS: ECOG 1  LABORATORY DATA: Lab Results  Component Value Date   WBC 5.6 09/01/2015   HGB  12.5 09/01/2015   HCT 38.2 09/01/2015   MCV 90.3 09/01/2015   PLT 242 09/01/2015      Chemistry      Component Value Date/Time   NA 143 09/01/2015 1417   NA 140 08/07/2015 0832   K 4.3 09/01/2015 1417   K 4.3 08/07/2015 0832   CL 104 08/07/2015 0832   CO2 31* 09/01/2015 1417   CO2 32 08/07/2015 0832   BUN 13.0 09/01/2015 1417   BUN 18 08/07/2015 0832   CREATININE 1.0 09/01/2015 1417   CREATININE 1.01 08/07/2015 0832      Component Value Date/Time   CALCIUM 9.8 09/01/2015 1417   CALCIUM 9.7 08/07/2015 0832   ALKPHOS 78 09/01/2015 1417   ALKPHOS 68 08/01/2014 2212   AST 20 09/01/2015 1417   AST 24 08/01/2014 2212   ALT 12 09/01/2015 1417   ALT 15 08/01/2014 2212   BILITOT 0.73 09/01/2015 1417   BILITOT 1.0 08/01/2014 2212       RADIOGRAPHIC STUDIES: Dg Chest 2 View  08/04/2015  CLINICAL DATA:  It persistent cough and chest congestion with posterior mid to lower left-sided back and chest discomfort, follow-up pneumonia from July 08, 2015 EXAM: CHEST  2 VIEW COMPARISON:  PA and lateral chest x-ray of July 08, 2015. FINDINGS: The lungs are adequately inflated. There is persistent alveolar opacity in the anterior aspect of the right lower lobe. There may be minimal involvement of the right middle  lobe medially. The right upper lobe is clear. The left lung exhibits coarse lingular lung markings. There is no pleural effusion. The heart and pulmonary vascularity are normal. There is no pneumothorax or pneumomediastinum. The bony thorax is unremarkable. IMPRESSION: Persistent abnormal density anteriorly in the right lower lobe and possibly medially in the right middle lobe suggesting persistent pneumonia or atelectasis. Lung markings in the left infrahilar region are more prominent today. Chest CT scanning is recommended. Electronically Signed   By: David  Martinique M.D.   On: 08/04/2015 15:24   Ct Chest W Contrast  08/07/2015  CLINICAL DATA:  Persistent infiltrative density  following appropriate therapy current pneumonia EXAM: CT CHEST WITH CONTRAST TECHNIQUE: Multidetector CT imaging of the chest was performed during intravenous contrast administration. CONTRAST:  38m OMNIPAQUE IOHEXOL 300 MG/ML  SOLN COMPARISON:  07/08/2015, 08/04/2015 FINDINGS: The lungs are well aerated bilaterally. The left lung demonstrates no focal infiltrate or sizable effusion. In the right lung however there is a somewhat spiculated central mass lesion associated with the central bronchial tree of the right lower lobe anteriorly. This is best seen on axial image number 31 of series 3 and coronal reconstruction image number 68 of series 5. It has surrounding spiculation and retraction of the major fissure identified on the sagittal reconstructions. These changes are consistent with a neoplastic process with postobstructive consolidation. Additionally some mild atelectasis is noted in the right middle lobe. The thoracic inlet is within normal limits. No hilar or mediastinal adenopathy is identified. The thoracic aorta and pulmonary artery as visualized are within normal limits. The upper abdomen shows changes of cholelithiasis. The adrenal glands as visualized are within normal limits. The left kidney is not noted in its normal position consistent with the given clinical history of pelvic kidney. No bony abnormality is seen. IMPRESSION: Spiculated appearing mass lesion in the right lower lobe centrally with peripheral postobstructive process. These changes are highly suspicious for neoplasm and PET-CT or biopsy is recommended for further evaluation. Electronically Signed   By: MInez CatalinaM.D.   On: 08/07/2015 15:49   Nm Pet Image Initial (pi) Skull Base To Thigh  08/21/2015  CLINICAL DATA:  Initial treatment strategy for staging of right lower lobe lung mass. Right lower lobe biopsy demonstrating benign lung tissue. EXAM: NUCLEAR MEDICINE PET SKULL BASE TO THIGH TECHNIQUE: 8.6 mCi F-18 FDG was injected  intravenously. Full-ring PET imaging was performed from the skull base to thigh after the radiotracer. CT data was obtained and used for attenuation correction and anatomic localization. FASTING BLOOD GLUCOSE:  Value: 84 mg/dl COMPARISON:  CT chest 08/07/2015.  Biopsy results of 08/19/2015. FINDINGS: NECK No areas of abnormal hypermetabolism. CHEST A pretracheal node measures 5 mm and a S.U.V. max of 6.0 on image 54/series 4. Subcarinal node measures 7 mm and a S.U.V. max of 9.3 on image 67/series 4. Right hilar/ infrahilar node measures a S.U.V. max of 5.2, including on image 47/series 4. The area of right lower lobe volume loss and partial collapse persists. Along its central most portion is an area of focal hypermetabolism which measures on the order of 2.3 cm and a S.U.V. max of 8.1, including on image 37/series 6. ABDOMEN/PELVIS No areas of abnormal hypermetabolism. SKELETON 11 mm sclerotic lesion in the right pubic bone corresponds to hypermetabolism, measuring a S.U.V. max of 4.4 on image 176/series 4. CT IMAGES PERFORMED FOR ATTENUATION CORRECTION Mucous retention cyst or polyp in the right maxillary sinus. No cervical adenopathy. Chest findings deferred to recent  diagnostic CT. Cholelithiasis. Normal adrenal glands. Left-sided pelvic kidney. Colonic stool burden suggests constipation. Scattered tiny sclerotic lesions which are nonspecific, given the right pubic bone lesion. Below PET resolution. IMPRESSION: 1. Hypermetabolism within the central aspect of the persistent area of right lower lobe volume loss and partial collapse. Although the CT appearance could represent resolving infection, the focal central hypermetabolism is suspicious for a centrally, partially obstructive neoplasm. 2. Hypermetabolic nonenlarged thoracic nodes. These are suspicious for nodal metastasis. Given unsuccessful sampling of the right lower lobe, consider sampling of hypermetabolic mediastinal nodes. 3. Isolated sclerotic  hypermetabolic right pubic bone lesion, suspicious for isolated osseous metastasis. 4. No extrathoracic soft tissue metastasis identified. Electronically Signed   By: Abigail Miyamoto M.D.   On: 08/21/2015 17:31   Dg Chest Port 1 View  08/19/2015  CLINICAL DATA:  Status post bronchoscopy. EXAM: PORTABLE CHEST 1 VIEW COMPARISON:  Chest CT 08/07/2015 FINDINGS: The cardiac silhouette, mediastinal and hilar contours are stable. Persistent right lower lobe opacity. No postprocedural pneumothorax or hematoma. No evidence of pulmonary hemorrhage. No pleural effusions. IMPRESSION: Status post bronchoscopy.  No postprocedural pneumothorax. Persistent right lower lobe lung opacity. Electronically Signed   By: Marijo Sanes M.D.   On: 08/19/2015 09:23   Dg C-arm Bronchoscopy  08/19/2015  CLINICAL DATA:  C-ARM BRONCHOSCOPY Fluoroscopy was utilized by the requesting physician.  No radiographic interpretation.    ASSESSMENT: This is a very pleasant 70 years old Hispanic female with highly suspicious for stage IV non-small cell lung cancer, adenocarcinoma in a never smoker patient. She presented with right lower lobe lung nodule in addition to mediastinal lymphadenopathy and questionable bone lesion in the right pubic bone. The tissue diagnosis is not confirmed at this point and the patient may benefit from another biopsy for confirmation and also for molecular studies.   PLAN: I had a lengthy discussion with the patient and her sister today about her current disease stage, prognosis and treatment options. I reviewed the PET scan images with the patient and her sister. I recommended for the patient to proceed with repeat bronchoscopy and endobronchial ultrasound and biopsy under the care of Dr. Lillie Fragmin as scheduled next week. I will complete the staging workup by ordering a MRI of the brain to rule out brain metastasis. I also recommended for the patient to have a blood test for EGFR mutation performed today. I will see  her back for follow-up visit in 2 weeks for reevaluation and more detailed discussion of her treatment options based on the final pathology and molecular study. She was advised to call immediately if she has any concerning symptoms in the interval. The patient voices understanding of current disease status and treatment options and is in agreement with the current care plan.  All questions were answered. The patient knows to call the clinic with any problems, questions or concerns. We can certainly see the patient much sooner if necessary.  Thank you so much for allowing me to participate in the care of Valerie Long. I will continue to follow up the patient with you and assist in her care.  I spent 40 minutes counseling the patient face to face. The total time spent in the appointment was 60 minutes.  Disclaimer: This note was dictated with voice recognition software. Similar sounding words can inadvertently be transcribed and may not be corrected upon review.   Valerie Long K. September 01, 2015, 3:14 PM

## 2015-09-01 NOTE — Telephone Encounter (Signed)
per pof to sch pt appt-sent back to lab-gave copy of avs

## 2015-09-02 NOTE — Pre-Procedure Instructions (Signed)
Demetress Tift  09/02/2015     Your procedure is scheduled on : Monday September 08, 2015 at 7:30 AM.  Report to [facility], [facility], [facility] Admitting at 5:30 AM.  Call this number if you have problems the morning of surgery: [phone]    Remember:  Do not eat food or drink liquids after midnight.  Take these medicines the morning of surgery with A SIP OF WATER : NONE   Do not wear jewelry, make-up or nail polish.  Do not wear lotions, powders, or perfumes.  You may NOT wear deodorant.  Do not shave 48 hours prior to surgery.    Do not bring valuables to the hospital.  [facility] is not responsible for any belongings or valuables.  Contacts, dentures or bridgework may not be worn into surgery.  Leave your suitcase in the car.  After surgery it may be brought to your room.  For patients admitted to the hospital, discharge time will be determined by your treatment team.  Patients discharged the day of surgery will not be allowed to drive home.   Name and phone number of your driver:    Special instructions:  Shower using CHG soap the night before and the morning of your surgery  Please read over the following fact sheets that you were given. Pain Booklet, Coughing and Deep Breathing and Surgical Site Infection Prevention

## 2015-09-03 ENCOUNTER — Encounter (HOSPITAL_COMMUNITY)
Admission: RE | Admit: 2015-09-03 | Discharge: 2015-09-03 | Disposition: A | Discharge status: Home / Self Care | Payer: BLUE CROSS/BLUE SHIELD | Source: Ambulatory Visit | Attending: Pulmonary Disease | Admitting: Pulmonary Disease

## 2015-09-03 ENCOUNTER — Telehealth: Payer: Self-pay | Admitting: Pulmonary Disease

## 2015-09-03 ENCOUNTER — Encounter (HOSPITAL_COMMUNITY): Payer: Self-pay

## 2015-09-03 DIAGNOSIS — R918 Other nonspecific abnormal finding of lung field: Secondary | ICD-10-CM | POA: Diagnosis not present

## 2015-09-03 DIAGNOSIS — Z01812 Encounter for preprocedural laboratory examination: Secondary | ICD-10-CM | POA: Insufficient documentation

## 2015-09-03 HISTORY — DX: Pneumonia, unspecified organism: J18.9

## 2015-09-03 HISTORY — DX: Reserved for inherently not codable concepts without codable children: IMO0001

## 2015-09-03 HISTORY — DX: Unspecified osteoarthritis, unspecified site: M19.90

## 2015-09-03 LAB — BASIC METABOLIC PANEL
Anion gap: 9 (ref 5–15)
BUN: 11 mg/dL (ref 6–20)
CO2: 25 mmol/L (ref 22–32)
Calcium: 9.8 mg/dL (ref 8.9–10.3)
Chloride: 105 mmol/L (ref 101–111)
Creatinine, Ser: 1.01 mg/dL — ABNORMAL HIGH (ref 0.44–1.00)
GFR calc Af Amer: >60 mL/min (ref >60)
GFR calc non Af Amer: 56 mL/min — ABNORMAL LOW (ref >60)
Glucose, Bld: 90 mg/dL (ref 65–99)
Potassium: 4.2 mmol/L (ref 3.5–5.1)
Sodium: 139 mmol/L (ref 135–145)

## 2015-09-03 LAB — CBC
HCT: 38.3 % (ref 36.0–46.0)
Hemoglobin: 12.8 g/dL (ref 12.0–15.0)
MCH: 30.4 pg (ref 26.0–34.0)
MCHC: 33.4 g/dL (ref 30.0–36.0)
MCV: 91 fL (ref 78.0–100.0)
Platelets: 226 10*3/uL (ref 150–400)
RBC: 4.21 MIL/uL (ref 3.87–5.11)
RDW: 13.8 % (ref 11.5–15.5)
WBC: 5 10*3/uL (ref 4.0–10.5)

## 2015-09-03 NOTE — Progress Notes (Signed)
Nurse called Dr. Bari Mantis office and spoke with receptionist and informed her that patient did not have any orders in EPIC, and requested that Dr Elsworth Soho enter orders in [facility] at his earliest convenience. Receptionist stated she would send a message to the Nurse.

## 2015-09-03 NOTE — Progress Notes (Signed)
Patient accompanied to PAT by her sister  PCP is Peterson Ao is Curt Bears  Patient denied having any current cardiac or pulmonary issues at this time.   Patient informed Nurse that she had a stress test many years ago (>5), but denied having a cardiac cath or sleep study.

## 2015-09-03 NOTE — Telephone Encounter (Signed)
Spoke with Valerie Long. Pt came in today to have her preadmission testing for her biopsy that is coming up. RA needs to enter the orders for this biopsy.

## 2015-09-04 ENCOUNTER — Other Ambulatory Visit: Payer: Self-pay | Admitting: Pulmonary Disease

## 2015-09-05 LAB — EPIDERMAL GROWTH FACTOR RECEPTOR (EGFR) MUTATION ANALYSIS

## 2015-09-05 NOTE — Telephone Encounter (Signed)
LMTCB for Valerie Long Did she receive the orders?

## 2015-09-07 NOTE — Anesthesia Preprocedure Evaluation (Addendum)
Anesthesia Evaluation  Patient identified by MRN, date of birth, ID band Patient awake    Reviewed: Allergy & Precautions, H&P , NPO status , Patient's Chart, lab work & pertinent test results  Airway Mallampati: II  TM Distance: >3 FB Neck ROM: Full    Dental no notable dental hx. (+) Teeth Intact, Dental Advisory Given   Pulmonary shortness of breath,  Lung CA   Pulmonary exam normal breath sounds clear to auscultation       Cardiovascular negative cardio ROS   Rhythm:Regular Rate:Normal     Neuro/Psych negative neurological ROS  negative psych ROS   GI/Hepatic negative GI ROS, Neg liver ROS,   Endo/Other  negative endocrine ROS  Renal/GU negative Renal ROS  negative genitourinary   Musculoskeletal  (+) Arthritis , Osteoarthritis,    Abdominal   Peds  Hematology negative hematology ROS (+)   Anesthesia Other Findings   Reproductive/Obstetrics negative OB ROS                            Anesthesia Physical Anesthesia Plan  ASA: III  Anesthesia Plan: General   Post-op Pain Management:    Induction: Intravenous  Airway Management Planned: Oral ETT  Additional Equipment:   Intra-op Plan:   Post-operative Plan: Extubation in OR and Possible Post-op intubation/ventilation  Informed Consent: I have reviewed the patients History and Physical, chart, labs and discussed the procedure including the risks, benefits and alternatives for the proposed anesthesia with the patient or authorized representative who has indicated his/her understanding and acceptance.   Dental advisory given  Plan Discussed with: CRNA  Anesthesia Plan Comments:         Anesthesia Quick Evaluation

## 2015-09-08 ENCOUNTER — Ambulatory Visit (HOSPITAL_COMMUNITY): Payer: BLUE CROSS/BLUE SHIELD | Admitting: Anesthesiology

## 2015-09-08 ENCOUNTER — Encounter (HOSPITAL_COMMUNITY)
Admission: RE | Disposition: A | Discharge status: Home / Self Care | Payer: Self-pay | Source: Ambulatory Visit | Attending: Pulmonary Disease

## 2015-09-08 ENCOUNTER — Ambulatory Visit (HOSPITAL_COMMUNITY)
Admission: RE | Admit: 2015-09-08 | Discharge: 2015-09-08 | Disposition: A | Discharge status: Home / Self Care | Payer: BLUE CROSS/BLUE SHIELD | Source: Ambulatory Visit | Attending: Pulmonary Disease | Admitting: Pulmonary Disease

## 2015-09-08 ENCOUNTER — Encounter (HOSPITAL_COMMUNITY): Payer: Self-pay | Admitting: Certified Registered"

## 2015-09-08 ENCOUNTER — Ambulatory Visit (HOSPITAL_COMMUNITY): Payer: BLUE CROSS/BLUE SHIELD

## 2015-09-08 DIAGNOSIS — Z9889 Other specified postprocedural states: Secondary | ICD-10-CM

## 2015-09-08 DIAGNOSIS — R918 Other nonspecific abnormal finding of lung field: Secondary | ICD-10-CM | POA: Diagnosis not present

## 2015-09-08 DIAGNOSIS — Z7982 Long term (current) use of aspirin: Secondary | ICD-10-CM | POA: Diagnosis not present

## 2015-09-08 DIAGNOSIS — Z419 Encounter for procedure for purposes other than remedying health state, unspecified: Secondary | ICD-10-CM

## 2015-09-08 HISTORY — PX: VIDEO BRONCHOSCOPY: SHX5072

## 2015-09-08 HISTORY — PX: VIDEO BRONCHOSCOPY WITH ENDOBRONCHIAL ULTRASOUND: SHX6177

## 2015-09-08 SURGERY — BRONCHOSCOPY, WITH EBUS
Anesthesia: General | Site: Chest | Laterality: Right

## 2015-09-08 MED ORDER — PHENYLEPHRINE HCL 10 MG/ML IJ SOLN
INTRAMUSCULAR | Status: DC | PRN
  Administered 2015-09-08 (×2): 80 ug via INTRAVENOUS
  Administered 2015-09-08: 120 ug via INTRAVENOUS

## 2015-09-08 MED ORDER — LIDOCAINE HCL (CARDIAC) 20 MG/ML IV SOLN
INTRAVENOUS | Status: AC
  Filled 2015-09-08: qty 5

## 2015-09-08 MED ORDER — ONDANSETRON HCL 4 MG/2ML IJ SOLN
INTRAMUSCULAR | Status: DC | PRN
  Administered 2015-09-08: 4 mg via INTRAVENOUS

## 2015-09-08 MED ORDER — ROCURONIUM BROMIDE 50 MG/5ML IV SOLN
INTRAVENOUS | Status: AC
  Filled 2015-09-08: qty 1

## 2015-09-08 MED ORDER — SUGAMMADEX SODIUM 200 MG/2ML IV SOLN
INTRAVENOUS | Status: DC | PRN
  Administered 2015-09-08: 200 mg via INTRAVENOUS

## 2015-09-08 MED ORDER — HYDROMORPHONE HCL 1 MG/ML IJ SOLN: 0.2500 mg | INTRAMUSCULAR | Status: DC | PRN

## 2015-09-08 MED ORDER — MIDAZOLAM HCL 5 MG/5ML IJ SOLN
INTRAMUSCULAR | Status: DC | PRN
  Administered 2015-09-08: 1 mg via INTRAVENOUS

## 2015-09-08 MED ORDER — PROPOFOL 10 MG/ML IV BOLUS
INTRAVENOUS | Status: DC | PRN
  Administered 2015-09-08: 100 mg via INTRAVENOUS

## 2015-09-08 MED ORDER — FENTANYL CITRATE (PF) 250 MCG/5ML IJ SOLN
INTRAMUSCULAR | Status: AC
  Filled 2015-09-08: qty 5

## 2015-09-08 MED ORDER — MIDAZOLAM HCL 2 MG/2ML IJ SOLN
INTRAMUSCULAR | Status: AC
  Filled 2015-09-08: qty 2

## 2015-09-08 MED ORDER — LIDOCAINE HCL (CARDIAC) 20 MG/ML IV SOLN
INTRAVENOUS | Status: DC | PRN
  Administered 2015-09-08: 100 mg via INTRAVENOUS

## 2015-09-08 MED ORDER — LACTATED RINGERS IV SOLN
INTRAVENOUS | Status: DC | PRN
  Administered 2015-09-08: 08:00:00 via INTRAVENOUS

## 2015-09-08 MED ORDER — ARTIFICIAL TEARS OP OINT
TOPICAL_OINTMENT | OPHTHALMIC | Status: DC | PRN
  Administered 2015-09-08: 1 via OPHTHALMIC

## 2015-09-08 MED ORDER — FENTANYL CITRATE (PF) 100 MCG/2ML IJ SOLN
INTRAMUSCULAR | Status: DC | PRN
Start: 2015-09-08 — End: 2015-09-08
  Administered 2015-09-08: 50 ug via INTRAVENOUS

## 2015-09-08 MED ORDER — 0.9 % SODIUM CHLORIDE (POUR BTL) OPTIME
TOPICAL | Status: DC | PRN
  Administered 2015-09-08: 1000 mL

## 2015-09-08 MED ORDER — ROCURONIUM BROMIDE 100 MG/10ML IV SOLN
INTRAVENOUS | Status: DC | PRN
  Administered 2015-09-08: 50 mg via INTRAVENOUS

## 2015-09-08 MED ORDER — SUGAMMADEX SODIUM 200 MG/2ML IV SOLN
INTRAVENOUS | Status: AC
  Filled 2015-09-08: qty 2

## 2015-09-08 MED ORDER — ARTIFICIAL TEARS OP OINT
TOPICAL_OINTMENT | OPHTHALMIC | Status: AC
  Filled 2015-09-08: qty 3.5

## 2015-09-08 MED ORDER — PROPOFOL 10 MG/ML IV BOLUS
INTRAVENOUS | Status: AC
  Filled 2015-09-08: qty 20

## 2015-09-08 MED ORDER — ONDANSETRON HCL 4 MG/2ML IJ SOLN
INTRAMUSCULAR | Status: AC
  Filled 2015-09-08: qty 2

## 2015-09-08 SURGICAL SUPPLY — 26 items
BRUSH CYTOL CELLEBRITY 1.5X140 (MISCELLANEOUS) ×3 IMPLANT
CANISTER SUCTION 2500CC (MISCELLANEOUS) ×3 IMPLANT
CONT SPEC 4OZ CLIKSEAL STRL BL (MISCELLANEOUS) ×3 IMPLANT
COVER DOME SNAP 22 D (MISCELLANEOUS) ×3 IMPLANT
COVER TABLE BACK 60X90 (DRAPES) ×3 IMPLANT
FORCEPS BIOP RJ4 1.8 (CUTTING FORCEPS) ×3 IMPLANT
GAUZE SPONGE 4X4 12PLY STRL (GAUZE/BANDAGES/DRESSINGS) ×3 IMPLANT
GLOVE BIOGEL PI IND STRL 6.5 (GLOVE) ×4 IMPLANT
GLOVE BIOGEL PI INDICATOR 6.5 (GLOVE) ×2
GLOVE ECLIPSE 6.5 STRL STRAW (GLOVE) ×3 IMPLANT
GLOVE SURG SIGNA 7.5 PF LTX (GLOVE) ×3 IMPLANT
KIT CLEAN ENDO COMPLIANCE (KITS) ×6 IMPLANT
KIT ROOM TURNOVER OR (KITS) ×3 IMPLANT
MARKER SKIN DUAL TIP RULER LAB (MISCELLANEOUS) ×3 IMPLANT
NEEDLE BIOPSY TRANSBRONCH 21G (NEEDLE) IMPLANT
NEEDLE EBUS SONO TIP PENTAX (NEEDLE) ×3 IMPLANT
NEEDLE SONO TIP II EBUS (NEEDLE) IMPLANT
NS IRRIG 1000ML POUR BTL (IV SOLUTION) ×3 IMPLANT
OIL SILICONE PENTAX (PARTS (SERVICE/REPAIRS)) ×3 IMPLANT
PAD ARMBOARD 7.5X6 YLW CONV (MISCELLANEOUS) ×6 IMPLANT
SYR 20CC LL (SYRINGE) ×3 IMPLANT
SYR 20ML ECCENTRIC (SYRINGE) ×6 IMPLANT
SYR 5ML LUER SLIP (SYRINGE) ×3 IMPLANT
TOWEL OR 17X24 6PK STRL BLUE (TOWEL DISPOSABLE) ×3 IMPLANT
TRAP SPECIMEN MUCOUS 40CC (MISCELLANEOUS) ×3 IMPLANT
TUBE CONNECTING 20X1/4 (TUBING) ×3 IMPLANT

## 2015-09-08 NOTE — Interval H&P Note (Signed)
History and Physical Interval Note:  09/08/2015 7:44 AM  Valerie Long  has presented today for surgery, with the diagnosis of ADENOCARCINOMA RIGHT LUNG ,Right lower lobe mass  The various methods of treatment have been discussed with the patient and family. After consideration of risks, benefits and other options for treatment, the patient has consented to  Procedure(s): [place]   (Right) as a surgical intervention .  The patient's history has been reviewed, patient examined, no change in status, stable for surgery.  I have reviewed the patient's chart and labs.  Questions were answered to the patient's satisfaction.     ALVA,RAKESH V.

## 2015-09-08 NOTE — Telephone Encounter (Signed)
Pl call in tessalon 200 mg tid prn for cough OK to take DELSYm 85m bid prn Call back if cough persists in 2 weeks

## 2015-09-08 NOTE — Op Note (Signed)
  Name:  Valerie Long MRN:  [id number] DOB:  [birth year]  PROCEDURE NOTE  Procedure(s): Flexible bronchoscopy [phone]) Brushing [phone]) of the RLL Bronchial alveolar lavage (41287) of the RLL Transbronchial lung biopsy, single lobe (86767) of the RLL  Endobronchial ultrasound (20947)   Indications:  Hilar / mediastinal lymphadenopathy with RLL adenocarcinoma.  Consent:  Written informed consent was obtained prior to the procedure. The risks of the procedure including coughing, bleeding and the small chance of lung puncture requiring chest tube were discussed in great detail. The benefits & alternatives including serial follow up were also discussed.  Anesthesia:  General endotracheal.  Procedure summary:  Appropriate equipment was assembled.  The patient was  identified as Valerie Long. Interim history obtained and brought to the operating room. Safety timeout was performed. The patient was placed supine on the operating table, airway established and general anesthesia administered by Anesthesia team.   After the appropriate level of anesthesia was assured, flexible video bronchoscope was lubricated and inserted through the endotracheal tube.    Airway examination was performed bilaterally to subsegmental level.  Minimal clear secretions were noted, mucosa appeared normal and no endobronchial lesions were identified.  Endobronchial ultrasound video bronchoscope was then lubricated and inserted through the endotracheal tube. Surveillance of the mediastinal and and right hilar lymph node stations was performed.  Pathologically enlarged lymph nodes were not noted.  Flexible video bronchoscope was used again to perform transbronchial mucosal biopsies under fluoro x 5.  After ensuring hemostasis , the bronchoscope was withdrawn.  The patient was extubated in operating room and transferred to PACU. Post-procedure chest x-ray was ordered.  Specimens sent: Bronchial alveolar lavage  specimen of the RLL for   Cytology. Brushings RLL TBBx RLL  Complications:  No immediate complications were noted.  Hemodynamic parameters and oxygenation remained stable throughout the procedure.  Estimated blood loss:  Less then 5 mL.   Kara Mead MD. [place] Flood.  Pulmonary & Critical care Pager [phone] If no response call [phone]   09/08/2015 8:42 AM

## 2015-09-08 NOTE — H&P (View-Only) (Signed)
   Subjective:    Patient ID: Valerie Long, female    DOB: August 22, 1945, 70 y.o.   MRN: [id number]  HPI  70 year old never smoker for FU of lung mass & persistent RLL infiltrate  She presented with persistent right lower lobe infiltrate after a chest cold She had negative mammogram in 10/2013, colonoscopy was normal  Chief Complaint  Patient presents with  . Follow-up    PET scan results   We discussed results of PET scan and bronchoscopy today in great detail  Significant tests/ events  CT chest from 08/07/15 showed spiculated appearing mass lesion in the right lower lobe with peripheral postobstructive consolidation. 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 There was 1.4 cm groundglass nodule in the right lower lobe with right middle lobe and lingular bronchiectasis  PET scan from 2010 showed right lower lobe nodule was hypermetabolic, mediastinal mass was not, there was focal activity at T12 spinal canal  bronchoscopy 08/2012 >> some narrowing in the lateral basal segment of the right lower lobe - brushings & TBBX  PET 1/03 >>Hypermetabolic RLL mass &  nonenlarged thoracic nodes, precarinal, subcarinal & infrahilar, Isolated sclerotic hypermetabolic right pubic bone lesion  Review of Systems neg for any significant sore throat, dysphagia, itching, sneezing, nasal congestion or excess/ purulent secretions, fever, chills, sweats, unintended wt loss, pleuritic or exertional cp, hempoptysis, orthopnea pnd or change in chronic leg swelling. Also denies presyncope, palpitations, heartburn, abdominal pain, nausea, vomiting, diarrhea or change in bowel or urinary habits, dysuria,hematuria, rash, arthralgias, visual complaints, headache, numbness weakness or ataxia.     Objective:   Physical Exam  Gen. Pleasant, well-nourished, in no distress ENT - no lesions, no  post nasal drip Neck: No JVD, no thyromegaly, no carotid bruits Lungs: no use of accessory muscles, no dullness to percussion, clear without rales or rhonchi  Cardiovascular: Rhythm regular, heart sounds  normal, no murmurs or gallops, no peripheral edema Musculoskeletal: No deformities, no cyanosis or clubbing         Assessment & Plan:

## 2015-09-08 NOTE — Anesthesia Procedure Notes (Signed)
Procedure Name: Intubation Date/Time: 09/08/2015 7:57 AM Performed by: Valerie Long Pre-anesthesia Checklist: Patient identified, Emergency Drugs available, Suction available and Patient being monitored Patient Re-evaluated:Patient Re-evaluated prior to inductionOxygen Delivery Method: Circle System Utilized Preoxygenation: Pre-oxygenation with 100% oxygen Intubation Type: IV induction Ventilation: Mask ventilation without difficulty Laryngoscope Size: Miller and 2 Grade View: Grade II Tube type: Oral Tube size: 8.5 mm Number of attempts: 1 Airway Equipment and Method: Stylet,  Oral airway and LTA kit utilized Placement Confirmation: ETT inserted through vocal cords under direct vision,  positive ETCO2 and breath sounds checked- equal and bilateral Secured at: 21 cm Tube secured with: Tape Dental Injury: Teeth and Oropharynx as per pre-operative assessment

## 2015-09-08 NOTE — Anesthesia Postprocedure Evaluation (Signed)
Anesthesia Post Note  Patient: Maylee Bare  Procedure(s) Performed: Procedure(s) (LRB): ATTEMPTED VIDEO BRONCHOSCOPY ENDOBRONCHIAL ULTRASOUND   (Right) VIDEO BRONCHOSCOPY WITH FLUORO (N/A)  Patient location during evaluation: PACU Anesthesia Type: General Level of consciousness: awake and alert Pain management: pain level controlled Vital Signs Assessment: post-procedure vital signs reviewed and stable Respiratory status: spontaneous breathing, nonlabored ventilation and respiratory function stable Cardiovascular status: blood pressure returned to baseline and stable Postop Assessment: no signs of nausea or vomiting Anesthetic complications: no    Last Vitals:  Filed Vitals:   09/08/15 0930 09/08/15 0945  BP: 110/69 102/65  Pulse: 71 63  Temp:    Resp: 20 14    Last Pain: There were no vitals filed for this visit.               Huzaifa Viney,W. EDMOND

## 2015-09-08 NOTE — Telephone Encounter (Signed)
Called [place], states pt's procedure was today so all orders have been taken care of.   lmtcb X1 for pt- what pharmacy does this need to be called in to?

## 2015-09-08 NOTE — Transfer of Care (Signed)
Immediate Anesthesia Transfer of Care Note  Patient: Valerie Long  Procedure(s) Performed: Procedure(s): ATTEMPTED VIDEO BRONCHOSCOPY ENDOBRONCHIAL ULTRASOUND   (Right) VIDEO BRONCHOSCOPY WITH FLUORO (N/A)  Patient Location: PACU  Anesthesia Type:General  Level of Consciousness: awake, alert  and oriented  Airway & Oxygen Therapy: Patient Spontanous Breathing and Patient connected to nasal cannula oxygen  Post-op Assessment: Report given to RN, Post -op Vital signs reviewed and stable and Patient moving all extremities X 4  Post vital signs: Reviewed and stable  Last Vitals:  Filed Vitals:   09/08/15 0616  BP: 140/79  Pulse: 72  Temp: 36.7 C  Resp: 18    Complications: No apparent anesthesia complications

## 2015-09-09 ENCOUNTER — Encounter (HOSPITAL_COMMUNITY): Payer: Self-pay | Admitting: Pulmonary Disease

## 2015-09-09 ENCOUNTER — Ambulatory Visit (HOSPITAL_COMMUNITY)
Admission: RE | Admit: 2015-09-09 | Discharge: 2015-09-09 | Disposition: A | Discharge status: Home / Self Care | Payer: BLUE CROSS/BLUE SHIELD | Source: Ambulatory Visit | Attending: Internal Medicine | Admitting: Internal Medicine

## 2015-09-09 DIAGNOSIS — C3491 Malignant neoplasm of unspecified part of right bronchus or lung: Secondary | ICD-10-CM | POA: Diagnosis present

## 2015-09-09 DIAGNOSIS — I739 Peripheral vascular disease, unspecified: Secondary | ICD-10-CM | POA: Insufficient documentation

## 2015-09-09 DIAGNOSIS — C3490 Malignant neoplasm of unspecified part of unspecified bronchus or lung: Secondary | ICD-10-CM | POA: Diagnosis not present

## 2015-09-09 MED ORDER — BENZONATATE 200 MG PO CAPS: 200.0000 mg | ORAL_CAPSULE | Freq: Three times a day (TID) | ORAL | Status: DC | PRN

## 2015-09-09 MED ORDER — GADOBENATE DIMEGLUMINE 529 MG/ML IV SOLN
15.0000 mL | Freq: Once | INTRAVENOUS | Status: AC | PRN
  Administered 2015-09-09: 14 mL via INTRAVENOUS

## 2015-09-09 NOTE — Telephone Encounter (Signed)
Spoke with patient - aware of medications being sent to pharmacy. Verified pharmacy. Nothing further needed.

## 2015-09-11 ENCOUNTER — Telehealth: Payer: Self-pay | Admitting: Pulmonary Disease

## 2015-09-11 DIAGNOSIS — R918 Other nonspecific abnormal finding of lung field: Secondary | ICD-10-CM

## 2015-09-11 NOTE — Telephone Encounter (Signed)
Discussed during [place]. Options include - -pubic bone biopsy - resection of lung mass + LN sampling  Proceed with TCTs referral to dr hendrickson  D/w pt

## 2015-09-12 ENCOUNTER — Telehealth: Payer: Self-pay | Admitting: Internal Medicine

## 2015-09-12 NOTE — Telephone Encounter (Signed)
PM PAL - spoke with patient sister re new time for 4/11 f/u at 11:30 am.

## 2015-09-16 ENCOUNTER — Ambulatory Visit (HOSPITAL_BASED_OUTPATIENT_CLINIC_OR_DEPARTMENT_OTHER): Payer: BLUE CROSS/BLUE SHIELD | Admitting: Internal Medicine

## 2015-09-16 ENCOUNTER — Encounter: Payer: Self-pay | Admitting: Internal Medicine

## 2015-09-16 VITALS — BP 140/79 | HR 82 | Temp 98.2°F | Resp 18 | Ht 63.0 in | Wt 152.2 lb

## 2015-09-16 DIAGNOSIS — R918 Other nonspecific abnormal finding of lung field: Secondary | ICD-10-CM

## 2015-09-16 DIAGNOSIS — M549 Dorsalgia, unspecified: Secondary | ICD-10-CM | POA: Diagnosis not present

## 2015-09-16 DIAGNOSIS — C3491 Malignant neoplasm of unspecified part of right bronchus or lung: Secondary | ICD-10-CM

## 2015-09-16 NOTE — Progress Notes (Signed)
[place] Telephone:([phone]   Fax:(336) [place], [place] [address] [place] [address]  DIAGNOSIS: Questionable stage IIIA/IV non-small cell lung cancer, adenocarcinoma presenting with right lower lobe lung nodule in addition to questionable mediastinal lymphadenopathy as well as suspicious bone lesion in the pubic ramus diagnosed in March 2017  PRIOR THERAPY: None  CURRENT THERAPY: None  INTERVAL HISTORY: Valerie Long 70 y.o. female returns to the clinic today for follow-up visit accompanied by her sister. The patient is feeling fine today was no specific complaints except for chronic back pain. She was seen recently by Dr. Elsworth Soho and underwent repeat bronchoscopy which showed atypical cells and was not conclusive for malignancy. The patient also had MRI of the brain that showed no evidence of metastatic disease to the brain. Blood test for EGFR mutation was negative. The patient denied having any significant chest pain, shortness of breath, cough or hemoptysis. She has no weight loss or night sweats. She denied having any fever or chills, no nausea or vomiting. She is here today for evaluation and discussion of her treatment options.  MEDICAL HISTORY: Past Medical History  Diagnosis Date  . Osteopenia   . Hypercholesterolemia   . 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 Adenocarcinoma of right lung, stage 4 ([place])   . Shortness of breath dyspnea   . Pneumonia   . Arthritis     ALLERGIES:  has No Known Allergies.  MEDICATIONS:  Current Outpatient Prescriptions  Medication Sig Dispense Refill  . aspirin 325 MG tablet Take 650 mg by mouth daily as needed for moderate pain or headache (headache and pain).    . benzonatate (TESSALON) 200 MG capsule Take 1 capsule (200 mg total) by mouth 3 (three) times daily as needed for cough. 90 capsule 0   No current facility-administered medications for  this visit.    SURGICAL HISTORY:  Past Surgical History  Procedure Laterality Date  . Cesarean section      myomectomy  . Video bronchoscopy Bilateral 08/19/2015    Procedure: VIDEO BRONCHOSCOPY WITH FLUORO;  Surgeon: Rigoberto Noel, MD;  Location: [facility];  Service: Cardiopulmonary;  Laterality: Bilateral;  . Colonoscopy w/ polypectomy    . Video bronchoscopy with endobronchial ultrasound Right 09/08/2015    Procedure: ATTEMPTED VIDEO BRONCHOSCOPY ENDOBRONCHIAL ULTRASOUND  ;  Surgeon: Rigoberto Noel, MD;  Location: [facility];  Service: Thoracic;  Laterality: Right;  . Video bronchoscopy N/A 09/08/2015    Procedure: VIDEO BRONCHOSCOPY WITH FLUORO;  Surgeon: Rigoberto Noel, MD;  Location: [facility];  Service: Thoracic;  Laterality: N/A;    REVIEW OF SYSTEMS:  Constitutional: negative Eyes: negative Ears, nose, mouth, throat, and face: negative Respiratory: negative Cardiovascular: negative Gastrointestinal: negative Genitourinary:negative Integument/breast: negative Hematologic/lymphatic: negative Musculoskeletal:positive for back pain Neurological: negative Behavioral/Psych: negative Endocrine: negative Allergic/Immunologic: negative   PHYSICAL EXAMINATION: General appearance: alert, cooperative and no distress Head: Normocephalic, without obvious abnormality, atraumatic Neck: no adenopathy, no JVD, supple, symmetrical, trachea midline and thyroid not enlarged, symmetric, no tenderness/mass/nodules Lymph nodes: Cervical, supraclavicular, and axillary nodes normal. Resp: clear to auscultation bilaterally Back: symmetric, no curvature. ROM normal. No CVA tenderness. Cardio: regular rate and rhythm, S1, S2 normal, no murmur, click, rub or gallop GI: soft, non-tender; bowel sounds normal; no masses,  no organomegaly Extremities: extremities normal, atraumatic, no cyanosis or edema Neurologic: Alert and oriented X 3,  normal strength and tone. Normal symmetric reflexes. Normal coordination  and gait  ECOG PERFORMANCE STATUS: 0 - Asymptomatic  Blood pressure 140/79, pulse 82, temperature 98.2 F (36.8 C), temperature source Oral, resp. rate 18, height _0  (1.6 m), weight 152 lb 3.2 oz (69.037 kg), last menstrual period 06/09/1998, SpO2 100 %.  LABORATORY DATA: Lab Results  Component Value Date   WBC 5.0 09/03/2015   HGB 12.8 09/03/2015   HCT 38.3 09/03/2015   MCV 91.0 09/03/2015   PLT 226 09/03/2015      Chemistry      Component Value Date/Time   NA 139 09/03/2015 1200   NA 143 09/01/2015 1417   K 4.2 09/03/2015 1200   K 4.3 09/01/2015 1417   CL 105 09/03/2015 1200   CO2 25 09/03/2015 1200   CO2 31* 09/01/2015 1417   BUN 11 09/03/2015 1200   BUN 13.0 09/01/2015 1417   CREATININE 1.01* 09/03/2015 1200   CREATININE 1.0 09/01/2015 1417      Component Value Date/Time   CALCIUM 9.8 09/03/2015 1200   CALCIUM 9.8 09/01/2015 1417   ALKPHOS 78 09/01/2015 1417   ALKPHOS 68 08/01/2014 2212   AST 20 09/01/2015 1417   AST 24 08/01/2014 2212   ALT 12 09/01/2015 1417   ALT 15 08/01/2014 2212   BILITOT 0.73 09/01/2015 1417   BILITOT 1.0 08/01/2014 2212       RADIOGRAPHIC STUDIES: Mr Jeri Cos Wo Contrast  2015-10-03  CLINICAL DATA:  70 year old female with stage IV lung cancer. Staging exam. Initial encounter. EXAM: MRI HEAD WITHOUT AND WITH CONTRAST TECHNIQUE: Multiplanar, multiecho pulse sequences of the brain and surrounding structures were obtained without and with intravenous contrast. CONTRAST:  3m MULTIHANCE GADOBENATE DIMEGLUMINE 529 MG/ML IV SOLN COMPARISON:  None. FINDINGS: No acute infarct or intracranial hemorrhage. No intracranial mass or abnormal enhancement. Several punctate/patchy white matter changes most notable frontal lobes which probably reflects result of small vessel disease. No age advanced atrophy or hydrocephalus. Major intracranial vascular structures are patent with left vertebral artery dominant in size. Paranasal sinus mucosal thickening  most notable right sphenoid sinus. No orbital abnormality noted. Slightly small pituitary gland. Cerebellar tonsils slightly low lying but within the range of normal limits. IMPRESSION: No evidence of intracranial metastatic disease. Mild small vessel disease changes. Paranasal sinus mucosal thickening most notable right sphenoid sinus. Electronically Signed   By: SGenia DelM.D.   On: 0April 28, 201718:24   Nm Pet Image Initial (pi) Skull Base To Thigh  08/21/2015  CLINICAL DATA:  Initial treatment strategy for staging of right lower lobe lung mass. Right lower lobe biopsy demonstrating benign lung tissue. EXAM: NUCLEAR MEDICINE PET SKULL BASE TO THIGH TECHNIQUE: 8.6 mCi F-18 FDG was injected intravenously. Full-ring PET imaging was performed from the skull base to thigh after the radiotracer. CT data was obtained and used for attenuation correction and anatomic localization. FASTING BLOOD GLUCOSE:  Value: 84 mg/dl COMPARISON:  CT chest 08/07/2015.  Biopsy results of 08/19/2015. FINDINGS: NECK No areas of abnormal hypermetabolism. CHEST A pretracheal node measures 5 mm and a S.U.V. max of 6.0 on image 54/series 4. Subcarinal node measures 7 mm and a S.U.V. max of 9.3 on image 67/series 4. Right hilar/ infrahilar node measures a S.U.V. max of 5.2, including on image 47/series 4. The area of right lower lobe volume loss and partial collapse persists. Along its central most portion is an area of focal hypermetabolism which measures on the order of 2.3 cm and  a S.U.V. max of 8.1, including on image 37/series 6. ABDOMEN/PELVIS No areas of abnormal hypermetabolism. SKELETON 11 mm sclerotic lesion in the right pubic bone corresponds to hypermetabolism, measuring a S.U.V. max of 4.4 on image 176/series 4. CT IMAGES PERFORMED FOR ATTENUATION CORRECTION Mucous retention cyst or polyp in the right maxillary sinus. No cervical adenopathy. Chest findings deferred to recent diagnostic CT. Cholelithiasis. Normal adrenal glands.  Left-sided pelvic kidney. Colonic stool burden suggests constipation. Scattered tiny sclerotic lesions which are nonspecific, given the right pubic bone lesion. Below PET resolution. IMPRESSION: 1. Hypermetabolism within the central aspect of the persistent area of right lower lobe volume loss and partial collapse. Although the CT appearance could represent resolving infection, the focal central hypermetabolism is suspicious for a centrally, partially obstructive neoplasm. 2. Hypermetabolic nonenlarged thoracic nodes. These are suspicious for nodal metastasis. Given unsuccessful sampling of the right lower lobe, consider sampling of hypermetabolic mediastinal nodes. 3. Isolated sclerotic hypermetabolic right pubic bone lesion, suspicious for isolated osseous metastasis. 4. No extrathoracic soft tissue metastasis identified. Electronically Signed   By: Abigail Miyamoto M.D.   On: 08/21/2015 17:31   Dg Chest Port 1 View  09/08/2015  CLINICAL DATA:  Status post bronchoscopic biopsy EXAM: PORTABLE CHEST 1 VIEW COMPARISON:  08/19/2015 chest radiograph. FINDINGS: Stable cardiomediastinal silhouette with mild cardiomegaly. No pneumothorax. No pleural effusion. No pulmonary edema. Hazy right lower lung opacity. IMPRESSION: 1. No pneumothorax. 2. Hazy right lower lung opacity, not appreciably changed, in keeping with atelectasis and known right lower lobe pulmonary nodule. Electronically Signed   By: Ilona Sorrel M.D.   On: 09/08/2015 09:48   Dg Chest Port 1 View  08/19/2015  CLINICAL DATA:  Status post bronchoscopy. EXAM: PORTABLE CHEST 1 VIEW COMPARISON:  Chest CT 08/07/2015 FINDINGS: The cardiac silhouette, mediastinal and hilar contours are stable. Persistent right lower lobe opacity. No postprocedural pneumothorax or hematoma. No evidence of pulmonary hemorrhage. No pleural effusions. IMPRESSION: Status post bronchoscopy.  No postprocedural pneumothorax. Persistent right lower lobe lung opacity. Electronically Signed    By: Marijo Sanes M.D.   On: 08/19/2015 09:23   Dg C-arm Bronchoscopy  09/08/2015  CLINICAL DATA:  C-ARM BRONCHOSCOPY Fluoroscopy was utilized by the requesting physician.  No radiographic interpretation.   Dg C-arm Bronchoscopy  08/19/2015  CLINICAL DATA:  C-ARM BRONCHOSCOPY Fluoroscopy was utilized by the requesting physician.  No radiographic interpretation.    ASSESSMENT AND PLAN: This is a very pleasant 70 years old Hispanic female with questionably stage IIIa/IV non-small cell lung cancer. Unfortunately the status of the mediastinal lymph nodes as well as the questionable brain lesion in the ramus are not confirmed. Her MRI of the brain showed no evidence for brain metastasis. Her case was discussed at the weekly thoracic conference and recommendation was to refer the patient to thoracic surgery for consideration of surgical resection of the right lower lobe lung nodule in addition to mediastinoscopy and evaluation of the mediastinal lymph nodes. The patient is scheduled to see Dr. Roxan Hockey tomorrow. I will see her back for follow-up visit after her surgical evaluation and procedure. The patient was advised to call immediately if she has any concerning symptoms in the interval. The patient voices understanding of current disease status and treatment options and is in agreement with the current care plan.  All questions were answered. The patient knows to call the clinic with any problems, questions or concerns. We can certainly see the patient much sooner if necessary.  I spent 15 minutes counseling  the patient face to face. The total time spent in the appointment was 25 minutes.  Disclaimer: This note was dictated with voice recognition software. Similar sounding words can inadvertently be transcribed and may not be corrected upon review.

## 2015-09-17 ENCOUNTER — Encounter: Payer: Medicare Other | Admitting: Thoracic Surgery (Cardiothoracic Vascular Surgery)

## 2015-09-18 ENCOUNTER — Encounter: Payer: Self-pay | Admitting: Thoracic Surgery (Cardiothoracic Vascular Surgery)

## 2015-09-18 ENCOUNTER — Other Ambulatory Visit: Payer: Self-pay | Admitting: *Deleted

## 2015-09-18 ENCOUNTER — Institutional Professional Consult (permissible substitution) (INDEPENDENT_AMBULATORY_CARE_PROVIDER_SITE_OTHER): Payer: BLUE CROSS/BLUE SHIELD | Admitting: Thoracic Surgery (Cardiothoracic Vascular Surgery)

## 2015-09-18 VITALS — BP 132/80 | HR 76 | Resp 16 | Ht 63.0 in | Wt 152.0 lb

## 2015-09-18 DIAGNOSIS — R590 Localized enlarged lymph nodes: Secondary | ICD-10-CM

## 2015-09-18 DIAGNOSIS — D381 Neoplasm of uncertain behavior of trachea, bronchus and lung: Secondary | ICD-10-CM

## 2015-09-18 DIAGNOSIS — J984 Other disorders of lung: Secondary | ICD-10-CM

## 2015-09-18 NOTE — Progress Notes (Signed)
PCP is Valerie November, MD Referring Provider is Rigoberto Noel, MD  Chief Complaint  Patient presents with  . Lung Lesion    RLLobe per CT CHEST 08/07/15, PET3/16/17...BRONCH/LUNG BX has been completed per DR. ALVA    HPI: 70 year old woman sent for consultation regarding a right lower lobe mass and mediastinal adenopathy.  Valerie Long is a 70 year old lifelong nonsmoker who has a past medical history of hypercholesterolemia and osteopenia. She also has a questionable history of an anterior mediastinal mass.  She was in her usual state of health until January of this year. She developed a cough with some subjective wheezing. She did not have any significant fevers or chills. She did note some hemoptysis. She has been treated with 3 separate rounds of antibiotics. A chest x-ray at the end of January showed a right middle lobe and lower lobe infiltrate. A follow-up was done at the end of February which showed a persistent right lower lobe infiltrate. A CT chest on 08/07/2015 showed a possible mass lesion in the right lower lobe with peripheral obstructive consolidation.  She saw Dr. Elsworth Soho. Bronchoscopy was performed on 2 separate occasions. Each time it was nondiagnostic. On one of the samples they did see atypical cells.  I PET/CT showed metabolic activity in the right lower lobe nodule, hypermetabolic but relatively normal sized hilar and mediastinal lymph nodes, and a hypermetabolic lesion in the pubic ramus. An MR of the brain was negative.  Zubrod Score: At the time of surgery this patient's most appropriate activity status/level should be described as: '[x]'$     0    Normal activity, no symptoms '[]'$     1    Restricted in physical strenuous activity but ambulatory, able to do out light work '[]'$     2    Ambulatory and capable of self care, unable to do work activities, up and about >50 % of waking hours                              '[]'$     3    Only limited self care, in bed greater than 50% of waking  hours '[]'$     4    Completely disabled, no self care, confined to bed or chair '[]'$     5    Moribund  There is no family history of lung cancer She had negative mammogram in 10/2013, colonoscopy was normal  Review of CT chest from 2010 [facility] shows 6 cm Long in 2 cm wide 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 There was 1.4 cm groundglass nodule in the right lower lobe with right middle lobe and lingular bronchiectasis  PET scan from 2010 showed right lower lobe nodule was hypermetabolic, mediastinal mass was not, there was focal activity at T12 spinal canal Past Medical History  Diagnosis Date  . Osteopenia   . Hypercholesterolemia   . 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 Adenocarcinoma of right lung, stage 4 ([place])   . Shortness of breath dyspnea   . Pneumonia   . Arthritis     Past Surgical History  Procedure Laterality Date  . Cesarean section      myomectomy  . Video bronchoscopy Bilateral 08/19/2015    Procedure: VIDEO BRONCHOSCOPY WITH FLUORO;  Surgeon: Rigoberto Noel, MD;  Location: [place];  Service: Cardiopulmonary;  Laterality: Bilateral;  . Colonoscopy w/  polypectomy    . Video bronchoscopy with endobronchial ultrasound Right 09/08/2015    Procedure: ATTEMPTED VIDEO BRONCHOSCOPY ENDOBRONCHIAL ULTRASOUND  ;  Surgeon: Rigoberto Noel, MD;  Location: [place];  Service: Thoracic;  Laterality: Right;  . Video bronchoscopy N/A 09/08/2015    Procedure: VIDEO BRONCHOSCOPY WITH FLUORO;  Surgeon: Rigoberto Noel, MD;  Location: [facility];  Service: Thoracic;  Laterality: N/A;    Family History  Problem Relation Age of Onset  . Diabetes Other     auncle-aunts   . Stroke Neg Hx   . Hypertension Mother     F and M  . Cancer Other     UTERINE   . CAD Father   . Breast cancer Neg Hx   . Colon cancer Neg Hx     Social History Social History  Substance Use Topics  . Smoking status: Never Smoker   .  Smokeless tobacco: Never Used  . Alcohol Use: No     Comment: SOCIAL    Current Outpatient Prescriptions  Medication Sig Dispense Refill  . aspirin 325 MG tablet Take 650 mg by mouth daily as needed for moderate pain or headache (headache and pain).    . benzonatate (TESSALON) 200 MG capsule Take 1 capsule (200 mg total) by mouth 3 (three) times daily as needed for cough. 90 capsule 0   No current facility-administered medications for this visit.    No Known Allergies  Review of Systems  Constitutional: Positive for unexpected weight change (Has lost 3 pounds in the past 3 months). Negative for fever, appetite change and fatigue.  HENT: Negative for trouble swallowing and voice change.   Eyes: Negative for visual disturbance.  Respiratory: Positive for cough (+ hemoptysis).   Cardiovascular: Negative for chest pain, palpitations and leg swelling.  Gastrointestinal: Negative for abdominal pain, blood in stool and abdominal distention.  Genitourinary: Negative for dysuria and hematuria.  Musculoskeletal: Negative for myalgias and arthralgias.  Neurological: Negative for seizures, syncope and weakness.  Hematological: Negative for adenopathy. Does not bruise/bleed easily.  Psychiatric/Behavioral: Negative.   All other systems reviewed and are negative.   BP 132/80 mmHg  Pulse 76  Resp 16  Ht '5\' 3"'$  (1.6 m)  Wt 152 lb (68.947 kg)  BMI 26.93 kg/m2  SpO2 96%  LMP 06/09/1998 Physical Exam  Constitutional: She is oriented to person, place, and time. She appears well-developed and well-nourished. No distress.  HENT:  Head: Normocephalic and atraumatic.  Eyes: Conjunctivae and EOM are normal. No scleral icterus.  Neck: Neck supple. No thyromegaly present.  Cardiovascular: Normal rate, regular rhythm, normal heart sounds and intact distal pulses.   No murmur heard. Pulmonary/Chest: Effort normal and breath sounds normal. She has no wheezes. She has no rales.  Abdominal: Soft. Bowel  sounds are normal. She exhibits no distension. There is no tenderness.  Musculoskeletal: She exhibits no edema.  Lymphadenopathy:    She has no cervical adenopathy.  Neurological: She is alert and oriented to person, place, and time. No cranial nerve deficit. She exhibits normal muscle tone.  Skin: Skin is warm and dry.  Vitals reviewed.    Diagnostic Tests: NUCLEAR MEDICINE PET SKULL BASE TO THIGH  TECHNIQUE: 8.6 mCi F-18 FDG was injected intravenously. Full-ring PET imaging was performed from the skull base to thigh after the radiotracer. CT data was obtained and used for attenuation correction and anatomic localization.  FASTING BLOOD GLUCOSE: Value: 84 mg/dl  COMPARISON: CT chest 08/07/2015. Biopsy results of 08/19/2015.  FINDINGS: NECK  No areas of abnormal hypermetabolism.  CHEST  A pretracheal node measures 5 mm and a S.U.V. max of 6.0 on image 54/series 4.  Subcarinal node measures 7 mm and a S.U.V. max of 9.3 on image 67/series 4.  Right hilar/ infrahilar node measures a S.U.V. max of 5.2, including on image 47/series 4.  The area of right lower lobe volume loss and partial collapse persists. Along its central most portion is an area of focal hypermetabolism which measures on the order of 2.3 cm and a S.U.V. max of 8.1, including on image 37/series 6.  ABDOMEN/PELVIS  No areas of abnormal hypermetabolism.  SKELETON  11 mm sclerotic lesion in the right pubic bone corresponds to hypermetabolism, measuring a S.U.V. max of 4.4 on image 176/series 4.  CT IMAGES PERFORMED FOR ATTENUATION CORRECTION  Mucous retention cyst or polyp in the right maxillary sinus. No cervical adenopathy.  Chest findings deferred to recent diagnostic CT.  Cholelithiasis. Normal adrenal glands. Left-sided pelvic kidney. Colonic stool burden suggests constipation.  Scattered tiny sclerotic lesions which are nonspecific, given the right pubic bone  lesion. Below PET resolution.  IMPRESSION: 1. Hypermetabolism within the central aspect of the persistent area of right lower lobe volume loss and partial collapse. Although the CT appearance could represent resolving infection, the focal central hypermetabolism is suspicious for a centrally, partially obstructive neoplasm. 2. Hypermetabolic nonenlarged thoracic nodes. These are suspicious for nodal metastasis. Given unsuccessful sampling of the right lower lobe, consider sampling of hypermetabolic mediastinal nodes. 3. Isolated sclerotic hypermetabolic right pubic bone lesion, suspicious for isolated osseous metastasis. 4. No extrathoracic soft tissue metastasis identified.   Electronically Signed  By: Abigail Miyamoto M.D.  On: 08/21/2015 17:31  I personally reviewed the CT and PET/CT her with findings as noted above.  Impression: 70 year old woman who is a lifelong nonsmoker who has a right lower lobe nodule with mediastinal adenopathy and a hypermetabolic lesion and pubic ramus. This is potentially stage IV cancer, but also could represent infectious or inflammatory etiology in the lung and mediastinal nodes and the pubic ramus lesion is another process altogether. She needs a tissue diagnosis to guide therapy.  She is already had 2 nondiagnostic bronchoscopies. Therefore I recommend that we proceed with mediastinal endoscopy for sampling of the mediastinal lymph nodes. These are not markedly enlarged on CT but are significantly hypermetabolic, particularly for their size.  I described the proposed procedure to her in detail. We discussed the incision to be used, the need for general anesthesia, and that this would be done on an outpatient basis. I informed her the indications, risks, benefits, and alternatives. She understands the risks include, but are not limited to death, MI, DVT, PE, stroke, bleeding, possible need for thoracotomy, recurrent nerve injury leading to hoarseness,  pneumothorax, esophageal injury, as well as the possibility of other unforeseeable complications.  She wishes to proceed with mediastinal endoscopy but wants to wait until after an out of town wedding on May 6. I recommended that she not wait that Long because of the delay in treatment.  It is unclear what to make of the anterior mediastinal mass that was seen on CT at [facility] in 2010 I see no sign of any such thing on her CT now. She denies having any surgery or radiation, so this may be an inaccurate report.  Plan: Plan mediastinal endoscopy, tentatively on Wednesday, 10/15/2015.  I encouraged her to do this earlier and she will take that under consideration.  Revonda Standard  Roxan Hockey, MD [facility] [phone]

## 2015-09-23 ENCOUNTER — Encounter: Payer: Self-pay | Admitting: Internal Medicine

## 2015-09-23 ENCOUNTER — Ambulatory Visit (INDEPENDENT_AMBULATORY_CARE_PROVIDER_SITE_OTHER): Payer: BLUE CROSS/BLUE SHIELD | Admitting: Internal Medicine

## 2015-09-23 VITALS — BP 118/70 | HR 65 | Temp 97.8°F | Ht 63.0 in | Wt 152.1 lb

## 2015-09-23 DIAGNOSIS — Z09 Encounter for follow-up examination after completed treatment for conditions other than malignant neoplasm: Secondary | ICD-10-CM

## 2015-09-23 DIAGNOSIS — R21 Rash and other nonspecific skin eruption: Secondary | ICD-10-CM

## 2015-09-23 MED ORDER — BETAMETHASONE DIPROPIONATE AUG 0.05 % EX CREA: TOPICAL_CREAM | Freq: Two times a day (BID) | CUTANEOUS | Status: DC

## 2015-09-23 NOTE — Progress Notes (Signed)
Pre visit review using our clinic review tool, if applicable. No additional management support is needed unless otherwise documented below in the visit note. 

## 2015-09-23 NOTE — Progress Notes (Signed)
Subjective:    Patient ID: Valerie Long, female    DOB: Nov 28, 1945, 70 y.o.   MRN: [id number]  DOS:  09/23/2015 Type of visit - description : Acute visit Interval history: A week ago developed a red rash at the upper chest, slightly itchy, skin was erythematous, slightly bumpy but no blisters per se. Denies the use of any new perfume, soap or cream. She thought it was related with Ladona Ridgel but denies generalized itching, lip or tongue swelling. She self discontinue Tessalon Perles because they were not helping with cough.      Review of Systems   Past Medical History  Diagnosis Date  . Osteopenia   . Hypercholesterolemia   . 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 Adenocarcinoma of right lung, stage 4 ([place])   . Shortness of breath dyspnea   . Pneumonia   . Arthritis     Past Surgical History  Procedure Laterality Date  . Cesarean section      myomectomy  . Video bronchoscopy Bilateral 08/19/2015    Procedure: VIDEO BRONCHOSCOPY WITH FLUORO;  Surgeon: Rigoberto Noel, MD;  Location: [place];  Service: Cardiopulmonary;  Laterality: Bilateral;  . Colonoscopy w/ polypectomy    . Video bronchoscopy with endobronchial ultrasound Right 09/08/2015    Procedure: ATTEMPTED VIDEO BRONCHOSCOPY ENDOBRONCHIAL ULTRASOUND  ;  Surgeon: Rigoberto Noel, MD;  Location: [place];  Service: Thoracic;  Laterality: Right;  . Video bronchoscopy N/A 09/08/2015    Procedure: VIDEO BRONCHOSCOPY WITH FLUORO;  Surgeon: Rigoberto Noel, MD;  Location: [place];  Service: Thoracic;  Laterality: N/A;    Social History   Social History  . Marital Status: Legally Separated    Spouse Name: N/A  . Number of Children: 1  . Years of Education: N/A   Occupational History  . 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 Smoking status: Never Smoker   . Smokeless tobacco: Never Used  . Alcohol Use: No     Comment: SOCIAL  . Drug Use: No  . Sexual Activity: No   Other Topics Concern    . Not on file   Social History Narrative   From [place], moved from [place]  to [place] 2008   Lives by herself   Diet- try to eat healthy   Exercise- usually 3-4 / week, walks , some weights        Medication List       This list is accurate as of: 09/23/15  5:32 PM.  Always use your most recent med list.               augmented betamethasone dipropionate 0.05 % cream  Commonly known as:  DIPROLENE-AF  Apply topically 2 (two) times daily.           Objective:   Physical Exam  Constitutional: She appears well-developed and well-nourished. No distress.  HENT:  Head: Normocephalic and atraumatic.  Skin: Skin is warm and dry. She is not diaphoretic.     Psychiatric: She has a normal mood and affect. Her behavior is normal. Judgment and thought content normal.   BP 118/70 mmHg  Pulse 65  Temp(Src) 97.8 F (36.6 C) (Oral)  Ht '5\' 3"'$  (1.6 m)  Wt 152 lb 2 oz (69.003 kg)  BMI 26.95 kg/m2  SpO2 99%  LMP 06/09/1998     Assessment & Plan:   Assessment > Hyperlipidemia Osteopenia Left pelvic  kidney -- by CT done at [place]  Abnormal CT chest, very suspicious for  Lung Ca, scheduled mediastinoscopy 09/25/2015  PLAN: Rash: History of rash at the neck with some itching, skin looks very good at this time. Rec  to avoid any perfumes , R$x a  steroid cream since she is still itching to some extent. Call if no better. See instructions. Lung cancer: To have a mediastinoscopy very soon to get a larger tissue sample , fortunately the patient has accepted the dx and is doing okay emotionally. Support is provided.

## 2015-09-23 NOTE — Assessment & Plan Note (Signed)
Rash: History of rash at the neck with some itching, skin looks very good at this time. Rec  to avoid any perfumes , R$x a  steroid cream since she is still itching to some extent. Call if no better. See instructions. Lung cancer: To have a mediastinoscopy very soon to get a larger tissue sample , fortunately the patient has accepted the dx and is doing okay emotionally. Support is provided.

## 2015-09-23 NOTE — Patient Instructions (Signed)
apliquese la crema 2 veces al dia por una semana  evite perfumes, cremas   Llame si no se mejora

## 2015-09-24 ENCOUNTER — Encounter (HOSPITAL_COMMUNITY): Payer: Self-pay | Admitting: *Deleted

## 2015-09-24 NOTE — Progress Notes (Signed)
Pt denies cardiac history, chest pain or sob. 

## 2015-09-25 ENCOUNTER — Encounter (HOSPITAL_COMMUNITY): Payer: Self-pay | Admitting: *Deleted

## 2015-09-25 ENCOUNTER — Ambulatory Visit (HOSPITAL_COMMUNITY): Payer: BLUE CROSS/BLUE SHIELD | Admitting: Anesthesiology

## 2015-09-25 ENCOUNTER — Ambulatory Visit (HOSPITAL_COMMUNITY): Payer: BLUE CROSS/BLUE SHIELD

## 2015-09-25 ENCOUNTER — Ambulatory Visit (HOSPITAL_COMMUNITY)
Admission: RE | Admit: 2015-09-25 | Discharge: 2015-09-25 | Disposition: A | Discharge status: Home / Self Care | Payer: BLUE CROSS/BLUE SHIELD | Source: Ambulatory Visit | Attending: Thoracic Surgery (Cardiothoracic Vascular Surgery) | Admitting: Thoracic Surgery (Cardiothoracic Vascular Surgery)

## 2015-09-25 ENCOUNTER — Encounter (HOSPITAL_COMMUNITY)
Admission: RE | Disposition: A | Discharge status: Home / Self Care | Payer: Self-pay | Source: Ambulatory Visit | Attending: Thoracic Surgery (Cardiothoracic Vascular Surgery)

## 2015-09-25 DIAGNOSIS — M8580 Other specified disorders of bone density and structure, unspecified site: Secondary | ICD-10-CM | POA: Insufficient documentation

## 2015-09-25 DIAGNOSIS — J984 Other disorders of lung: Secondary | ICD-10-CM

## 2015-09-25 DIAGNOSIS — E7800 Pure hypercholesterolemia, unspecified: Secondary | ICD-10-CM | POA: Insufficient documentation

## 2015-09-25 DIAGNOSIS — R590 Localized enlarged lymph nodes: Secondary | ICD-10-CM

## 2015-09-25 DIAGNOSIS — Z7982 Long term (current) use of aspirin: Secondary | ICD-10-CM | POA: Insufficient documentation

## 2015-09-25 DIAGNOSIS — C771 Secondary and unspecified malignant neoplasm of intrathoracic lymph nodes: Secondary | ICD-10-CM | POA: Diagnosis not present

## 2015-09-25 DIAGNOSIS — R918 Other nonspecific abnormal finding of lung field: Secondary | ICD-10-CM | POA: Diagnosis not present

## 2015-09-25 DIAGNOSIS — M1990 Unspecified osteoarthritis, unspecified site: Secondary | ICD-10-CM | POA: Diagnosis not present

## 2015-09-25 HISTORY — PX: MEDIASTINOSCOPY: SHX5086

## 2015-09-25 LAB — SURGICAL PCR SCREEN
MRSA, PCR: NEGATIVE
Staphylococcus aureus: NEGATIVE

## 2015-09-25 LAB — CBC
HCT: 37.1 % (ref 36.0–46.0)
Hemoglobin: 11.9 g/dL — ABNORMAL LOW (ref 12.0–15.0)
MCH: 28.9 pg (ref 26.0–34.0)
MCHC: 32.1 g/dL (ref 30.0–36.0)
MCV: 90 fL (ref 78.0–100.0)
Platelets: 224 10*3/uL (ref 150–400)
RBC: 4.12 MIL/uL (ref 3.87–5.11)
RDW: 13.7 % (ref 11.5–15.5)
WBC: 4.3 10*3/uL (ref 4.0–10.5)

## 2015-09-25 LAB — COMPREHENSIVE METABOLIC PANEL
ALT: 15 U/L (ref 14–54)
AST: 20 U/L (ref 15–41)
Albumin: 3.7 g/dL (ref 3.5–5.0)
Alkaline Phosphatase: 64 U/L (ref 38–126)
Anion gap: 10 (ref 5–15)
BUN: 16 mg/dL (ref 6–20)
CO2: 24 mmol/L (ref 22–32)
Calcium: 9.8 mg/dL (ref 8.9–10.3)
Chloride: 107 mmol/L (ref 101–111)
Creatinine, Ser: 1.06 mg/dL — ABNORMAL HIGH (ref 0.44–1.00)
GFR calc Af Amer: >60 mL/min (ref >60)
GFR calc non Af Amer: 53 mL/min — ABNORMAL LOW (ref >60)
Glucose, Bld: 96 mg/dL (ref 65–99)
Potassium: 4 mmol/L (ref 3.5–5.1)
Sodium: 141 mmol/L (ref 135–145)
Total Bilirubin: 1.1 mg/dL (ref 0.3–1.2)
Total Protein: 6.9 g/dL (ref 6.5–8.1)

## 2015-09-25 LAB — APTT: aPTT: 29 seconds (ref 24–37)

## 2015-09-25 LAB — PROTIME-INR
INR: 1.02 (ref 0.00–1.49)
Prothrombin Time: 13.6 seconds (ref 11.6–15.2)

## 2015-09-25 LAB — ABO/RH: ABO/RH(D): O POS

## 2015-09-25 SURGERY — MEDIASTINOSCOPY
Anesthesia: General | Site: Chest

## 2015-09-25 MED ORDER — MUPIROCIN 2 % EX OINT
1.0000 "application " | TOPICAL_OINTMENT | Freq: Once | CUTANEOUS | Status: AC
  Administered 2015-09-25: 1 via TOPICAL
  Filled 2015-09-25: qty 22

## 2015-09-25 MED ORDER — CEFUROXIME SODIUM 1.5 G IJ SOLR
1.5000 g | INTRAMUSCULAR | Status: AC
  Administered 2015-09-25: 1.5 g via INTRAVENOUS
  Filled 2015-09-25: qty 1.5

## 2015-09-25 MED ORDER — ROCURONIUM BROMIDE 100 MG/10ML IV SOLN
INTRAVENOUS | Status: DC | PRN
  Administered 2015-09-25: 40 mg via INTRAVENOUS

## 2015-09-25 MED ORDER — LIDOCAINE HCL 4 % EX SOLN: CUTANEOUS | Status: DC | PRN

## 2015-09-25 MED ORDER — DEXAMETHASONE SODIUM PHOSPHATE 4 MG/ML IJ SOLN
INTRAMUSCULAR | Status: DC | PRN
  Administered 2015-09-25: 4 mg via INTRAVENOUS

## 2015-09-25 MED ORDER — OXYCODONE HCL 5 MG PO TABS: 5.0000 mg | ORAL_TABLET | Freq: Four times a day (QID) | ORAL | Status: DC | PRN

## 2015-09-25 MED ORDER — LACTATED RINGERS IV SOLN
INTRAVENOUS | Status: DC
  Administered 2015-09-25: 09:00:00 via INTRAVENOUS

## 2015-09-25 MED ORDER — 0.9 % SODIUM CHLORIDE (POUR BTL) OPTIME
TOPICAL | Status: DC | PRN
  Administered 2015-09-25: 1000 mL

## 2015-09-25 MED ORDER — SUGAMMADEX SODIUM 200 MG/2ML IV SOLN
INTRAVENOUS | Status: DC | PRN
  Administered 2015-09-25: 150 mg via INTRAVENOUS

## 2015-09-25 MED ORDER — FENTANYL CITRATE (PF) 100 MCG/2ML IJ SOLN
INTRAMUSCULAR | Status: DC | PRN
  Administered 2015-09-25: 100 ug via INTRAVENOUS
  Administered 2015-09-25: 50 ug via INTRAVENOUS

## 2015-09-25 MED ORDER — ONDANSETRON HCL 4 MG/2ML IJ SOLN
INTRAMUSCULAR | Status: DC | PRN
  Administered 2015-09-25: 4 mg via INTRAVENOUS

## 2015-09-25 MED ORDER — PROPOFOL 10 MG/ML IV BOLUS
INTRAVENOUS | Status: AC
  Filled 2015-09-25: qty 20

## 2015-09-25 MED ORDER — FENTANYL CITRATE (PF) 250 MCG/5ML IJ SOLN
INTRAMUSCULAR | Status: AC
  Filled 2015-09-25: qty 5

## 2015-09-25 MED ORDER — CHLORHEXIDINE GLUCONATE 4 % EX LIQD: 1.0000 "application " | Freq: Once | CUTANEOUS | Status: DC

## 2015-09-25 MED ORDER — SUGAMMADEX SODIUM 200 MG/2ML IV SOLN
INTRAVENOUS | Status: AC
  Filled 2015-09-25: qty 2

## 2015-09-25 MED ORDER — MIDAZOLAM HCL 5 MG/5ML IJ SOLN
INTRAMUSCULAR | Status: DC | PRN
  Administered 2015-09-25: 2 mg via INTRAVENOUS

## 2015-09-25 MED ORDER — DEXAMETHASONE SODIUM PHOSPHATE 4 MG/ML IJ SOLN
INTRAMUSCULAR | Status: AC
  Filled 2015-09-25: qty 1

## 2015-09-25 MED ORDER — SODIUM CHLORIDE 0.9 % IV SOLN
10.0000 mg | INTRAVENOUS | Status: DC | PRN
  Administered 2015-09-25: 50 ug/min via INTRAVENOUS

## 2015-09-25 MED ORDER — NEOSTIGMINE METHYLSULFATE 10 MG/10ML IV SOLN
INTRAVENOUS | Status: AC
  Filled 2015-09-25: qty 1

## 2015-09-25 MED ORDER — MIDAZOLAM HCL 2 MG/2ML IJ SOLN
INTRAMUSCULAR | Status: AC
  Filled 2015-09-25: qty 2

## 2015-09-25 MED ORDER — GLYCOPYRROLATE 0.2 MG/ML IJ SOLN
INTRAMUSCULAR | Status: AC
  Filled 2015-09-25: qty 2

## 2015-09-25 MED ORDER — LIDOCAINE HCL (CARDIAC) 20 MG/ML IV SOLN
INTRAVENOUS | Status: DC | PRN
  Administered 2015-09-25: 50 mg via INTRAVENOUS

## 2015-09-25 MED ORDER — PROPOFOL 10 MG/ML IV BOLUS
INTRAVENOUS | Status: DC | PRN
  Administered 2015-09-25: 150 mg via INTRAVENOUS

## 2015-09-25 SURGICAL SUPPLY — 49 items
APPLIER CLIP LOGIC TI 5 (MISCELLANEOUS) IMPLANT
BLADE SURG 15 STRL LF DISP TIS (BLADE) ×1 IMPLANT
BLADE SURG 15 STRL SS (BLADE) ×1
CANISTER SUCTION 2500CC (MISCELLANEOUS) ×2 IMPLANT
CLIP TI MEDIUM 6 (CLIP) ×2 IMPLANT
CONT SPEC 4OZ CLIKSEAL STRL BL (MISCELLANEOUS) ×10 IMPLANT
COVER SURGICAL LIGHT HANDLE (MISCELLANEOUS) ×4 IMPLANT
DERMABOND ADVANCED (GAUZE/BANDAGES/DRESSINGS) ×1
DERMABOND ADVANCED .7 DNX12 (GAUZE/BANDAGES/DRESSINGS) ×1 IMPLANT
DRAPE CHEST BREAST 15X10 FENES (DRAPES) ×2 IMPLANT
ELECT CAUTERY BLADE 6.4 (BLADE) ×2 IMPLANT
ELECT REM PT RETURN 9FT ADLT (ELECTROSURGICAL) ×2
ELECTRODE REM PT RTRN 9FT ADLT (ELECTROSURGICAL) ×1 IMPLANT
GAUZE SPONGE 4X4 12PLY STRL (GAUZE/BANDAGES/DRESSINGS) ×2 IMPLANT
GAUZE SPONGE 4X4 16PLY XRAY LF (GAUZE/BANDAGES/DRESSINGS) ×2 IMPLANT
GLOVE BIOGEL PI IND STRL 6.5 (GLOVE) ×1 IMPLANT
GLOVE BIOGEL PI IND STRL 7.0 (GLOVE) ×1 IMPLANT
GLOVE BIOGEL PI INDICATOR 6.5 (GLOVE) ×1
GLOVE BIOGEL PI INDICATOR 7.0 (GLOVE) ×1
GLOVE SURG SIGNA 7.5 PF LTX (GLOVE) ×2 IMPLANT
GLOVE SURG SS PI 6.5 STRL IVOR (GLOVE) ×2 IMPLANT
GOWN STRL REUS W/ TWL LRG LVL3 (GOWN DISPOSABLE) ×1 IMPLANT
GOWN STRL REUS W/ TWL XL LVL3 (GOWN DISPOSABLE) ×1 IMPLANT
GOWN STRL REUS W/TWL LRG LVL3 (GOWN DISPOSABLE) ×1
GOWN STRL REUS W/TWL XL LVL3 (GOWN DISPOSABLE) ×1
HEMOSTAT SURGICEL 2X14 (HEMOSTASIS) ×2 IMPLANT
KIT BASIN OR (CUSTOM PROCEDURE TRAY) ×2 IMPLANT
KIT ROOM TURNOVER OR (KITS) ×2 IMPLANT
NS IRRIG 1000ML POUR BTL (IV SOLUTION) ×2 IMPLANT
PACK SURGICAL SETUP 50X90 (CUSTOM PROCEDURE TRAY) ×2 IMPLANT
PAD ARMBOARD 7.5X6 YLW CONV (MISCELLANEOUS) ×4 IMPLANT
PENCIL BUTTON HOLSTER BLD 10FT (ELECTRODE) ×2 IMPLANT
SPONGE INTESTINAL PEANUT (DISPOSABLE) ×2 IMPLANT
SUT SILK 2 0 (SUTURE)
SUT SILK 2-0 18XBRD TIE 12 (SUTURE) IMPLANT
SUT VIC AB 2-0 CT1 27 (SUTURE) ×1
SUT VIC AB 2-0 CT1 TAPERPNT 27 (SUTURE) ×1 IMPLANT
SUT VIC AB 3-0 SH 18 (SUTURE) ×2 IMPLANT
SUT VIC AB 3-0 SH 27 (SUTURE) ×1
SUT VIC AB 3-0 SH 27X BRD (SUTURE) ×1 IMPLANT
SUT VICRYL 4-0 PS2 18IN ABS (SUTURE) ×2 IMPLANT
SWAB COLLECTION DEVICE MRSA (MISCELLANEOUS) IMPLANT
SYR BULB IRRIGATION 50ML (SYRINGE) ×2 IMPLANT
SYRINGE 10CC LL (SYRINGE) ×2 IMPLANT
TOWEL OR 17X24 6PK STRL BLUE (TOWEL DISPOSABLE) ×2 IMPLANT
TOWEL OR 17X26 10 PK STRL BLUE (TOWEL DISPOSABLE) ×2 IMPLANT
TUBE ANAEROBIC SPECIMEN COL (MISCELLANEOUS) IMPLANT
TUBE CONNECTING 12X1/4 (SUCTIONS) ×2 IMPLANT
WATER STERILE IRR 1000ML POUR (IV SOLUTION) ×2 IMPLANT

## 2015-09-25 NOTE — Brief Op Note (Signed)
09/25/2015  12:32 PM  PATIENT:  Levin Bacon  70 y.o. female  PRE-OPERATIVE DIAGNOSIS:  RIGHT LOWER LOBE MASS MEDIASTINAL ADENOPATHY  POST-OPERATIVE DIAGNOSIS:  RIGHT LOWER LOBE MASS MEDIASTINAL ADENOPATHY  PROCEDURE:  Procedure(s): MEDIASTINOSCOPY (N/A)  SURGEON:  Surgeon(s) and Role:    * Melrose Nakayama, MD - Primary  ANESTHESIA:   general  EBL:     BLOOD ADMINISTERED:none  DRAINS: none   LOCAL MEDICATIONS USED:  NONE  SPECIMEN:  Source of Specimen:  mediastinal nodes  DISPOSITION OF SPECIMEN:  Pathology and Micro (AFB and fungal)  COUNTS:  YES  PLAN OF CARE: Discharge to home after PACU  PATIENT DISPOSITION:  PACU - hemodynamically stable.   Delay start of Pharmacological VTE agent (>24hrs) due to surgical blood loss or risk of bleeding: not applicable  FINDINGS: enlarged nodes, frozen- no tumor seen

## 2015-09-25 NOTE — H&P (View-Only) (Signed)
PCP is [name], MD Referring Provider is Rigoberto Noel, MD  Chief Complaint  Patient presents with  . Lung Lesion    RLLobe per CT CHEST 08/07/15, PET3/16/17...BRONCH/LUNG BX has been completed per DR. ALVA    HPI: 70 year old woman sent for consultation regarding a right lower lobe mass and mediastinal adenopathy.  Valerie Long is a 70 year old lifelong nonsmoker who has a past medical history of hypercholesterolemia and osteopenia. She also has a questionable history of an anterior mediastinal mass.  She was in her usual state of health until January of this year. She developed a cough with some subjective wheezing. She did not have any significant fevers or chills. She did note some hemoptysis. She has been treated with 3 separate rounds of antibiotics. A chest x-ray at the end of January showed a right middle lobe and lower lobe infiltrate. A follow-up was done at the end of February which showed a persistent right lower lobe infiltrate. A CT chest on 08/07/2015 showed a possible mass lesion in the right lower lobe with peripheral obstructive consolidation.  She saw Dr. Elsworth Soho. Bronchoscopy was performed on 2 separate occasions. Each time it was nondiagnostic. On one of the samples they did see atypical cells.  I PET/CT showed metabolic activity in the right lower lobe nodule, hypermetabolic but relatively normal sized hilar and mediastinal lymph nodes, and a hypermetabolic lesion in the pubic ramus. An MR of the brain was negative.  Zubrod Score: At the time of surgery this patient's most appropriate activity status/level should be described as: '[x]'$     0    Normal activity, no symptoms '[]'$     1    Restricted in physical strenuous activity but ambulatory, able to do out light work '[]'$     2    Ambulatory and capable of self care, unable to do work activities, up and about >50 % of waking hours                              '[]'$     3    Only limited self care, in bed greater than 50% of waking  hours '[]'$     4    Completely disabled, no self care, confined to bed or chair '[]'$     5    Moribund  There is no family history of lung cancer She had negative mammogram in 10/2013, colonoscopy was normal  Review of CT chest from 2010 from care everywhere [facility]) shows 6 cm long in 2 cm wide 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 There was 1.4 cm groundglass nodule in the right lower lobe with right middle lobe and lingular bronchiectasis  PET scan from 2010 showed right lower lobe nodule was hypermetabolic, mediastinal mass was not, there was focal activity at T12 spinal canal Past Medical History  Diagnosis Date  . Osteopenia   . Hypercholesterolemia   . 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 Adenocarcinoma of right lung, stage 4 ([place])   . Shortness of breath dyspnea   . Pneumonia   . Arthritis     Past Surgical History  Procedure Laterality Date  . Cesarean section      myomectomy  . Video bronchoscopy Bilateral 08/19/2015    Procedure: VIDEO BRONCHOSCOPY WITH FLUORO;  Surgeon: Rigoberto Noel, MD;  Location: [place];  Service: Cardiopulmonary;  Laterality: Bilateral;  . Colonoscopy w/  polypectomy    . Video bronchoscopy with endobronchial ultrasound Right 09/08/2015    Procedure: ATTEMPTED VIDEO BRONCHOSCOPY ENDOBRONCHIAL ULTRASOUND  ;  Surgeon: Rigoberto Noel, MD;  Location: [place];  Service: Thoracic;  Laterality: Right;  . Video bronchoscopy N/A 09/08/2015    Procedure: VIDEO BRONCHOSCOPY WITH FLUORO;  Surgeon: Rigoberto Noel, MD;  Location: [facility] OR;  Service: Thoracic;  Laterality: N/A;    Family History  Problem Relation Age of Onset  . Diabetes Other     auncle-aunts   . Stroke Neg Hx   . Hypertension Mother     F and M  . Cancer Other     UTERINE   . CAD Father   . Breast cancer Neg Hx   . Colon cancer Neg Hx     Social History Social History  Substance Use Topics  . Smoking status: Never Smoker   .  Smokeless tobacco: Never Used  . Alcohol Use: No     Comment: SOCIAL    Current Outpatient Prescriptions  Medication Sig Dispense Refill  . aspirin 325 MG tablet Take 650 mg by mouth daily as needed for moderate pain or headache (headache and pain).    . benzonatate (TESSALON) 200 MG capsule Take 1 capsule (200 mg total) by mouth 3 (three) times daily as needed for cough. 90 capsule 0   No current facility-administered medications for this visit.    No Known Allergies  Review of Systems  Constitutional: Positive for unexpected weight change (Has lost 3 pounds in the past 3 months). Negative for fever, appetite change and fatigue.  HENT: Negative for trouble swallowing and voice change.   Eyes: Negative for visual disturbance.  Respiratory: Positive for cough (+ hemoptysis).   Cardiovascular: Negative for chest pain, palpitations and leg swelling.  Gastrointestinal: Negative for abdominal pain, blood in stool and abdominal distention.  Genitourinary: Negative for dysuria and hematuria.  Musculoskeletal: Negative for myalgias and arthralgias.  Neurological: Negative for seizures, syncope and weakness.  Hematological: Negative for adenopathy. Does not bruise/bleed easily.  Psychiatric/Behavioral: Negative.   All other systems reviewed and are negative.   BP 132/80 mmHg  Pulse 76  Resp 16  Ht '5\' 3"'$  (1.6 m)  Wt 152 lb (68.947 kg)  BMI 26.93 kg/m2  SpO2 96%  LMP 06/09/1998 Physical Exam  Constitutional: She is oriented to person, place, and time. She appears well-developed and well-nourished. No distress.  HENT:  Head: Normocephalic and atraumatic.  Eyes: Conjunctivae and EOM are normal. No scleral icterus.  Neck: Neck supple. No thyromegaly present.  Cardiovascular: Normal rate, regular rhythm, normal heart sounds and intact distal pulses.   No murmur heard. Pulmonary/Chest: Effort normal and breath sounds normal. She has no wheezes. She has no rales.  Abdominal: Soft. Bowel  sounds are normal. She exhibits no distension. There is no tenderness.  Musculoskeletal: She exhibits no edema.  Lymphadenopathy:    She has no cervical adenopathy.  Neurological: She is alert and oriented to person, place, and time. No cranial nerve deficit. She exhibits normal muscle tone.  Skin: Skin is warm and dry.  Vitals reviewed.    Diagnostic Tests: NUCLEAR MEDICINE PET SKULL BASE TO THIGH  TECHNIQUE: 8.6 mCi F-18 FDG was injected intravenously. Full-ring PET imaging was performed from the skull base to thigh after the radiotracer. CT data was obtained and used for attenuation correction and anatomic localization.  FASTING BLOOD GLUCOSE: Value: 84 mg/dl  COMPARISON: CT chest 08/07/2015. Biopsy results of 08/19/2015.  FINDINGS: NECK  No areas of abnormal hypermetabolism.  CHEST  A pretracheal node measures 5 mm and a S.U.V. max of 6.0 on image 54/series 4.  Subcarinal node measures 7 mm and a S.U.V. max of 9.3 on image 67/series 4.  Right hilar/ infrahilar node measures a S.U.V. max of 5.2, including on image 47/series 4.  The area of right lower lobe volume loss and partial collapse persists. Along its central most portion is an area of focal hypermetabolism which measures on the order of 2.3 cm and a S.U.V. max of 8.1, including on image 37/series 6.  ABDOMEN/PELVIS  No areas of abnormal hypermetabolism.  SKELETON  11 mm sclerotic lesion in the right pubic bone corresponds to hypermetabolism, measuring a S.U.V. max of 4.4 on image 176/series 4.  CT IMAGES PERFORMED FOR ATTENUATION CORRECTION  Mucous retention cyst or polyp in the right maxillary sinus. No cervical adenopathy.  Chest findings deferred to recent diagnostic CT.  Cholelithiasis. Normal adrenal glands. Left-sided pelvic kidney. Colonic stool burden suggests constipation.  Scattered tiny sclerotic lesions which are nonspecific, given the right pubic bone  lesion. Below PET resolution.  IMPRESSION: 1. Hypermetabolism within the central aspect of the persistent area of right lower lobe volume loss and partial collapse. Although the CT appearance could represent resolving infection, the focal central hypermetabolism is suspicious for a centrally, partially obstructive neoplasm. 2. Hypermetabolic nonenlarged thoracic nodes. These are suspicious for nodal metastasis. Given unsuccessful sampling of the right lower lobe, consider sampling of hypermetabolic mediastinal nodes. 3. Isolated sclerotic hypermetabolic right pubic bone lesion, suspicious for isolated osseous metastasis. 4. No extrathoracic soft tissue metastasis identified.   Electronically Signed  By: Abigail Miyamoto M.D.  On: 08/21/2015 17:31  I personally reviewed the CT and PET/CT her with findings as noted above.  Impression: 70 year old woman who is a lifelong nonsmoker who has a right lower lobe nodule with mediastinal adenopathy and a hypermetabolic lesion and pubic ramus. This is potentially stage IV cancer, but also could represent infectious or inflammatory etiology in the lung and mediastinal nodes and the pubic ramus lesion is another process altogether. She needs a tissue diagnosis to guide therapy.  She is already had 2 nondiagnostic bronchoscopies. Therefore I recommend that we proceed with mediastinal endoscopy for sampling of the mediastinal lymph nodes. These are not markedly enlarged on CT but are significantly hypermetabolic, particularly for their size.  I described the proposed procedure to her in detail. We discussed the incision to be used, the need for general anesthesia, and that this would be done on an outpatient basis. I informed her the indications, risks, benefits, and alternatives. She understands the risks include, but are not limited to death, MI, DVT, PE, stroke, bleeding, possible need for thoracotomy, recurrent nerve injury leading to hoarseness,  pneumothorax, esophageal injury, as well as the possibility of other unforeseeable complications.  She wishes to proceed with mediastinal endoscopy but wants to wait until after an out of town wedding on May 6. I recommended that she not wait that long because of the delay in treatment.  It is unclear what to make of the anterior mediastinal mass that was seen on CT at [facility] in 2010 I see no sign of any such thing on her CT now. She denies having any surgery or radiation, so this may be an inaccurate report.  Plan: Plan mediastinal endoscopy, tentatively on Wednesday, 10/15/2015.  I encouraged her to do this earlier and she will take that under consideration.  Valerie Long  Roxan Hockey, MD [facility] [phone]

## 2015-09-25 NOTE — Anesthesia Procedure Notes (Signed)
Procedure Name: Intubation Date/Time: 09/25/2015 11:18 AM Performed by: Rush Farmer E Pre-anesthesia Checklist: Patient identified, Emergency Drugs available, Suction available, Patient being monitored and Timeout performed Patient Re-evaluated:Patient Re-evaluated prior to inductionOxygen Delivery Method: Circle system utilized Preoxygenation: Pre-oxygenation with 100% oxygen Intubation Type: IV induction Ventilation: Mask ventilation without difficulty Laryngoscope Size: Mac and 3 Grade View: Grade II Tube type: Oral Tube size: 7.5 mm Number of attempts: 1 Airway Equipment and Method: LTA kit utilized and Stylet Placement Confirmation: ETT inserted through vocal cords under direct vision,  positive ETCO2 and breath sounds checked- equal and bilateral Secured at: 21 cm Tube secured with: Tape Dental Injury: Teeth and Oropharynx as per pre-operative assessment

## 2015-09-25 NOTE — Discharge Instructions (Signed)
Do not drive or engage in heavy physical activity for 48 hours.  You have a prescription for oxycodone, a narcotic pain reliever, you may use as directed  You may use acetaminophen (Tylenol) or ibuprofen in addition, to or instead of, the oxycodone  After 48 hours you may begin driving when you are no longer taking the narcotic pain medication.  You may shower tomorrow.  There is a medical adhesive over the incision. It will begin to peel off in 10- 14 days.  Call [phone] if you develop chest pain, shortness of breath, fever > 101 F or notice redness, swelling or drainage from the incision  My office will contact you with an appointment for next week to discuss the results of the biopsies.

## 2015-09-25 NOTE — Op Note (Signed)
NAMENEKEYA, BRISKI              ACCOUNT NO.:  [id number]  MEDICAL RECORD NO.:  [id number]  LOCATION:  [facility]                         FACILITY:  [facility]  PHYSICIAN:  Revonda Standard. Roxan Hockey, M.D.DATE OF BIRTH:  1945/12/23  DATE OF PROCEDURE:  09/25/2015 DATE OF DISCHARGE:  09/25/2015                              OPERATIVE REPORT   PREOPERATIVE DIAGNOSIS:  Right lower lobe infiltrate and mediastinal adenopathy.  POSTOPERATIVE DIAGNOSIS:  Right lower lobe infiltrate and mediastinal adenopathy.  PROCEDURE:  Mediastinoscopy.  SURGEON:  Revonda Standard. Roxan Hockey, M.D.  ASSISTANT:  None.  ANESTHESIA:  General.  FINDINGS:  Enlarged but otherwise benign-appearing 4R and 7 lymph nodes. On frozen section, no tumor seen.  CLINICAL NOTE:  Ms. Tiedt is a 70 year old lifelong nonsmoker who recently was found to have a right lower lobe infiltrate and possible nodule along with mediastinal adenopathy.  Mediastinal nodes were not markedly enlarged on CT but were markedly hypermetabolic on PET.  She had undergone 2 bronchoscopies, both of which were nondiagnostic. She now was advised to have mediastinoscopy to sample the mediastinal lymph nodes prior to possible surgical resection.  The indications, risks, benefits, and alternatives were discussed in detail with the patient. She understood and accepted the risks and agreed to proceed.  OPERATIVE NOTE:  Ms. Pilling was brought to the preoperative holding area on September 25, 2015.  Anesthesia established intravenous access and placed an arterial blood pressure monitoring line in the right radial artery.  She was taken to the operating room, anesthetized, and intubated.  Intravenous antibiotics were administered.  The neck and chest were prepped and draped in usual sterile fashion.  A transverse incision was made 1 fingerbreadth above the sternal notch.  It was carried through the skin and subcutaneous tissue.  The strap muscles were separated in  the midline.  The pretracheal fascia was identified and incised, and the pretracheal plane was developed bluntly into the mediastinum.  The mediastinoscope was inserted and systematic inspection of the mediastinal lymph node stations was carried out.  There were enlarged 4R and 7 lymph nodes.  The level 7 nodes were biopsied first. A small initial piece was sent for frozen section, and the remainder was sent for permanent pathology.  One piece was also sent for AFB and fungal cultures along with a sample from the 4R node.  Minimal use of electrocautery was used to achieve hemostasis.  Next, 2 separate enlarged but otherwise relatively benign appearing 4R lymph nodes were dissected out.  Biopsies were taken from these nodes as well.  There were no significantly enlarged lymph nodes in the left paratracheal space.  The wound was packed for 5 minutes with gauze.  After 5 minutes, the gauze packing was removed.  The mediastinoscope was reinserted.  On reinserting the scope, a third 4R node was identified.  This was a small node but was easily accessible and was removed and sent for permanent pathology.  There was no ongoing bleeding.  The mediastinoscope was removed.  The frozen section returned showing no evidence of metastatic carcinoma.  The wound was closed in 2 layers.  Dermabond was applied.  The patient was extubated in the operating room and taken to the  postanesthetic care unit in good condition.     Revonda Standard Roxan Hockey, M.D.     SCH/MEDQ  D:  09/25/2015  T:  09/25/2015  Job:  [id number]

## 2015-09-25 NOTE — Progress Notes (Signed)
Right radial arterial line dc'd, therapy completed, tip intact, pressure held x 5 minutes, dsd to site. No hematoma.

## 2015-09-25 NOTE — Anesthesia Preprocedure Evaluation (Addendum)
Anesthesia Evaluation  Patient identified by MRN, date of birth, ID band Patient awake    Reviewed: Allergy & Precautions, H&P , NPO status , Patient's Chart, lab work & pertinent test results  Airway Mallampati: II  TM Distance: >3 FB Neck ROM: Full    Dental no notable dental hx. (+) Teeth Intact, Dental Advisory Given, Caps   Pulmonary shortness of breath,  Lung CA   Pulmonary exam normal breath sounds clear to auscultation       Cardiovascular negative cardio ROS   Rhythm:Regular Rate:Normal     Neuro/Psych negative neurological ROS  negative psych ROS   GI/Hepatic negative GI ROS, Neg liver ROS,   Endo/Other  negative endocrine ROS  Renal/GU negative Renal ROS  negative genitourinary   Musculoskeletal  (+) Arthritis , Osteoarthritis,    Abdominal   Peds  Hematology negative hematology ROS (+)   Anesthesia Other Findings   Reproductive/Obstetrics negative OB ROS                            Anesthesia Physical Anesthesia Plan  ASA: III  Anesthesia Plan: General   Post-op Pain Management:    Induction: Intravenous  Airway Management Planned: Oral ETT  Additional Equipment:   Intra-op Plan:   Post-operative Plan: Extubation in OR  Informed Consent: I have reviewed the patients History and Physical, chart, labs and discussed the procedure including the risks, benefits and alternatives for the proposed anesthesia with the patient or authorized representative who has indicated his/her understanding and acceptance.   Dental advisory given  Plan Discussed with: CRNA and Surgeon  Anesthesia Plan Comments:        Anesthesia Quick Evaluation

## 2015-09-25 NOTE — Transfer of Care (Signed)
Immediate Anesthesia Transfer of Care Note  Patient: Valerie Long  Procedure(s) Performed: Procedure(s): MEDIASTINOSCOPY (N/A)  Patient Location: PACU  Anesthesia Type:General  Level of Consciousness: awake, alert  and oriented  Airway & Oxygen Therapy: Patient Spontanous Breathing and Patient connected to nasal cannula oxygen  Post-op Assessment: Report given to RN, Post -op Vital signs reviewed and stable and Patient moving all extremities X 4  Post vital signs: Reviewed and stable  Last Vitals:  Filed Vitals:   09/25/15 0810  BP: 139/84  Pulse: 71  Temp: 36.4 C  Resp: 16    Complications: No apparent anesthesia complications

## 2015-09-25 NOTE — Interval H&P Note (Signed)
History and Physical Interval Note:  09/25/2015 10:34 AM  Valerie Long  has presented today for surgery, with the diagnosis of RLL MASS MEDIASTINAL ADENOPATHY  The various methods of treatment have been discussed with the patient and family. After consideration of risks, benefits and other options for treatment, the patient has consented to  Procedure(s): MEDIASTINOSCOPY (N/A) as a surgical intervention .  The patient's history has been reviewed, patient examined, no change in status, stable for surgery.  I have reviewed the patient's chart and labs.  Questions were answered to the patient's satisfaction.     Melrose Nakayama

## 2015-09-26 ENCOUNTER — Encounter (HOSPITAL_COMMUNITY): Payer: Self-pay | Admitting: Thoracic Surgery (Cardiothoracic Vascular Surgery)

## 2015-09-26 LAB — TYPE AND SCREEN
ABO/RH(D): O POS
Antibody Screen: NEGATIVE

## 2015-09-26 LAB — ACID FAST SMEAR (AFB, MYCOBACTERIA): Acid Fast Smear: NEGATIVE

## 2015-09-26 NOTE — Anesthesia Postprocedure Evaluation (Signed)
Anesthesia Post Note  Patient: Javia Dillow  Procedure(s) Performed: Procedure(s) (LRB): MEDIASTINOSCOPY (N/A)  Patient location during evaluation: PACU Anesthesia Type: General Level of consciousness: awake and alert Pain management: pain level controlled Vital Signs Assessment: post-procedure vital signs reviewed and stable Respiratory status: spontaneous breathing, nonlabored ventilation, respiratory function stable and patient connected to nasal cannula oxygen Cardiovascular status: blood pressure returned to baseline and stable Postop Assessment: no signs of nausea or vomiting Anesthetic complications: no    Last Vitals:  Filed Vitals:   09/25/15 1314 09/25/15 1330  BP:  136/78  Pulse: 71 70  Temp:  36.1 C  Resp: 20 18    Last Pain:  Filed Vitals:   09/25/15 1338  PainSc: Asleep                 Kazuko Clemence,JAMES TERRILL

## 2015-09-29 LAB — TISSUE CULTURE
Culture: NO GROWTH
Special Requests: 7

## 2015-10-01 ENCOUNTER — Ambulatory Visit (INDEPENDENT_AMBULATORY_CARE_PROVIDER_SITE_OTHER): Payer: BLUE CROSS/BLUE SHIELD | Admitting: Thoracic Surgery (Cardiothoracic Vascular Surgery)

## 2015-10-01 ENCOUNTER — Encounter: Payer: Self-pay | Admitting: Thoracic Surgery (Cardiothoracic Vascular Surgery)

## 2015-10-01 VITALS — BP 127/87 | HR 110 | Resp 20 | Ht 63.0 in | Wt 152.0 lb

## 2015-10-01 DIAGNOSIS — D381 Neoplasm of uncertain behavior of trachea, bronchus and lung: Secondary | ICD-10-CM

## 2015-10-01 MED ORDER — HYDROCODONE-HOMATROPINE 5-1.5 MG/5ML PO SYRP: 5.0000 mL | ORAL_SOLUTION | Freq: Four times a day (QID) | ORAL | Status: DC | PRN

## 2015-10-01 NOTE — Progress Notes (Signed)
      [address]       [place] [address]             [phone]       HPI: Valerie Long turns today to discuss the results of her mediastinal endoscopy last week.  She is a 70 year old woman who is a lifelong nonsmoker. She presented with a persistent cough. Workup eventually revealed a right lung nodule. Biopsies of that showed atypical cells. She had some mediastinal adenopathy on PET CT. There also was hypermetabolic lesion in the pubic ramus.  I did mediastinal endoscopy on 09/25/2015. The procedure went well and she went home the same day.  She's having a little incisional pain. She also has some discomfort due to the occluded its over the incision. She is still bothered by persistent cough and is requesting a stronger cough medication  Past Medical History  Diagnosis Date  . Osteopenia   . Hypercholesterolemia   . 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 Adenocarcinoma of right lung, stage 4 ([place])   . Shortness of breath dyspnea   . Pneumonia   . Arthritis   '  Current Outpatient Prescriptions  Medication Sig Dispense Refill  . aspirin EC 325 MG tablet Take 650 mg by mouth daily as needed for moderate pain.    Marland Kitchen betamethasone dipropionate (DIPROLENE) 0.05 % cream Apply 1 application topically 2 (two) times daily.    Marland Kitchen oxyCODONE (OXY IR/ROXICODONE) 5 MG immediate release tablet Take 1-2 tablets (5-10 mg total) by mouth every 6 (six) hours as needed (pain). 30 tablet 0  . HYDROcodone-homatropine (HYCODAN) 5-1.5 MG/5ML syrup Take 5 mLs by mouth every 6 (six) hours as needed for cough. 120 mL 0   No current facility-administered medications for this visit.    Physical Exam BP 127/87 mmHg  Pulse 110  Resp 20  Ht '5\' 3"'$  (1.6 m)  Wt 152 lb (68.947 kg)  BMI 26.93 kg/m2  SpO2 98%  LMP 06/09/1998 70 year old woman in no acute distress Incision healing well  Diagnostic Tests: I reviewed her pathology results. She had metastatic  adenocarcinoma in 3 of 5 lymph nodes.  Impression: [name] is a 70 year old nonsmoker who has a right lower lobe adenocarcinoma with involvement of 3 out of 5 mediastinal lymph nodes. She has at least stage IIIa disease and possibly stage IV disease depending on whether the lesion in the pubic ramus is a focus of metastatic disease.  I discussed these results with her. She was understandably disappointed with the news.  We need to get her back in to see Dr. Julien Nordmann as soon as possible to initiate treatment  Plan: Follow-up with Dr. Thad Ranger, MD [facility] [phone]

## 2015-10-02 ENCOUNTER — Telehealth: Payer: Self-pay | Admitting: *Deleted

## 2015-10-02 ENCOUNTER — Encounter: Payer: Self-pay | Admitting: *Deleted

## 2015-10-02 DIAGNOSIS — C3491 Malignant neoplasm of unspecified part of right bronchus or lung: Secondary | ICD-10-CM

## 2015-10-02 NOTE — Telephone Encounter (Signed)
Called patient back to change appt time.  Patient verbalized understanding of appt on 10/06/15 arrive at 8:30 for labs and to see Dr. Julien Nordmann at 8:45.

## 2015-10-02 NOTE — Telephone Encounter (Signed)
Oncology Nurse Navigator Documentation  Oncology Nurse Navigator Flowsheets 10/02/2015  Navigator Encounter Type Telephone  Telephone Outgoing Call  Treatment Phase Pre-Tx/Tx Discussion  Barriers/Navigation Needs Coordination of Care  Interventions Coordination of Care  Coordination of Care Appts  Acuity Level 1  Acuity Level 1 -  Time Spent with Patient 15   Per cancer conference today, Valerie Long needs a follow up with Dr. Julien Nordmann. I called patient and arrange an appt on 10/06/15 arrive at 1:45

## 2015-10-06 ENCOUNTER — Encounter: Payer: Self-pay | Admitting: *Deleted

## 2015-10-06 ENCOUNTER — Other Ambulatory Visit (HOSPITAL_BASED_OUTPATIENT_CLINIC_OR_DEPARTMENT_OTHER): Payer: Medicare Other

## 2015-10-06 ENCOUNTER — Telehealth: Payer: Self-pay | Admitting: Internal Medicine

## 2015-10-06 ENCOUNTER — Encounter: Payer: Self-pay | Admitting: Internal Medicine

## 2015-10-06 ENCOUNTER — Ambulatory Visit (HOSPITAL_BASED_OUTPATIENT_CLINIC_OR_DEPARTMENT_OTHER): Payer: BLUE CROSS/BLUE SHIELD | Admitting: Internal Medicine

## 2015-10-06 VITALS — BP 143/81 | HR 86 | Temp 98.6°F | Resp 18 | Ht 63.0 in | Wt 151.6 lb

## 2015-10-06 DIAGNOSIS — C771 Secondary and unspecified malignant neoplasm of intrathoracic lymph nodes: Secondary | ICD-10-CM

## 2015-10-06 DIAGNOSIS — C3491 Malignant neoplasm of unspecified part of right bronchus or lung: Secondary | ICD-10-CM

## 2015-10-06 DIAGNOSIS — M549 Dorsalgia, unspecified: Secondary | ICD-10-CM

## 2015-10-06 DIAGNOSIS — C3431 Malignant neoplasm of lower lobe, right bronchus or lung: Secondary | ICD-10-CM | POA: Diagnosis present

## 2015-10-06 DIAGNOSIS — G8929 Other chronic pain: Secondary | ICD-10-CM

## 2015-10-06 LAB — COMPREHENSIVE METABOLIC PANEL
ALT: 11 U/L (ref 0–55)
AST: 18 U/L (ref 5–34)
Albumin: 3.8 g/dL (ref 3.5–5.0)
Alkaline Phosphatase: 76 U/L (ref 40–150)
Anion Gap: 5 mEq/L (ref 3–11)
BUN: 14 mg/dL (ref 7.0–26.0)
CO2: 30 mEq/L — ABNORMAL HIGH (ref 22–29)
Calcium: 9.7 mg/dL (ref 8.4–10.4)
Chloride: 106 mEq/L (ref 98–109)
Creatinine: 1.1 mg/dL (ref 0.6–1.1)
EGFR: 53 mL/min/{1.73_m2} — ABNORMAL LOW (ref >90)
Glucose: 89 mg/dl (ref 70–140)
Potassium: 4.4 mEq/L (ref 3.5–5.1)
Sodium: 141 mEq/L (ref 136–145)
Total Bilirubin: 0.82 mg/dL (ref 0.20–1.20)
Total Protein: 7 g/dL (ref 6.4–8.3)

## 2015-10-06 LAB — CBC WITH DIFFERENTIAL/PLATELET
BASO%: 0.6 % (ref 0.0–2.0)
Basophils Absolute: 0 10*3/uL (ref 0.0–0.1)
EOS%: 3.6 % (ref 0.0–7.0)
Eosinophils Absolute: 0.2 10*3/uL (ref 0.0–0.5)
HCT: 35.5 % (ref 34.8–46.6)
HGB: 11.6 g/dL (ref 11.6–15.9)
LYMPH%: 31.3 % (ref 14.0–49.7)
MCH: 30.1 pg (ref 25.1–34.0)
MCHC: 32.7 g/dL (ref 31.5–36.0)
MCV: 92.2 fL (ref 79.5–101.0)
MONO#: 0.5 10*3/uL (ref 0.1–0.9)
MONO%: 9.2 % (ref 0.0–14.0)
NEUT#: 2.9 10*3/uL (ref 1.5–6.5)
NEUT%: 55.3 % (ref 38.4–76.8)
Platelets: 225 10*3/uL (ref 145–400)
RBC: 3.85 10*6/uL (ref 3.70–5.45)
RDW: 13.7 % (ref 11.2–14.5)
WBC: 5.3 10*3/uL (ref 3.9–10.3)
lymph#: 1.7 10*3/uL (ref 0.9–3.3)

## 2015-10-06 LAB — FUNGAL ORGANISM REFLEX

## 2015-10-06 LAB — FUNGUS CULTURE WITH STAIN

## 2015-10-06 LAB — FUNGUS CULTURE RESULT

## 2015-10-06 NOTE — Progress Notes (Signed)
[facility] [phone]   Fax:(336) [place], [place] [address] [place] [address]  DIAGNOSIS: Stage IIIA/IV non-small cell lung cancer, adenocarcinoma presenting with right lower lobe lung nodule in addition to questionable mediastinal lymphadenopathy as well as suspicious bone lesion in the pubic ramus diagnosed in March 2017  PRIOR THERAPY: None  CURRENT THERAPY: None  INTERVAL HISTORY: Valerie Long 70 y.o. female returns to the clinic today for follow-up visit accompanied by her sister. The patient is feeling fine today was no specific complaints except for chronic back pain. The patient denied having any significant chest pain, shortness of breath, cough or hemoptysis. She has no weight loss or night sweats. She denied having any fever or chills, no nausea or vomiting. She was seen by Dr. Roxan Hockey and underwent mediastinoscopy with biopsy of the mediastinal lymph nodes. The final pathology (Accession: [id number]) lymph nodes from level 7 and 4R was positive for metastatic adenocarcinoma. She is here today for evaluation and discussion of her treatment options.  MEDICAL HISTORY: Past Medical History  Diagnosis Date  . Osteopenia   . Hypercholesterolemia   . 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 Adenocarcinoma of right lung, stage 4 ([place])   . Shortness of breath dyspnea   . Pneumonia   . Arthritis     ALLERGIES:  has No Known Allergies.  MEDICATIONS:  Current Outpatient Prescriptions  Medication Sig Dispense Refill  . aspirin EC 325 MG tablet Take 650 mg by mouth daily as needed for moderate pain.    Marland Kitchen betamethasone dipropionate (DIPROLENE) 0.05 % cream Apply 1 application topically 2 (two) times daily.    Marland Kitchen HYDROcodone-homatropine (HYCODAN) 5-1.5 MG/5ML syrup Take 5 mLs by mouth every 6 (six) hours as needed for cough. 120 mL 0   No current facility-administered  medications for this visit.    SURGICAL HISTORY:  Past Surgical History  Procedure Laterality Date  . Cesarean section      myomectomy  . Video bronchoscopy Bilateral 08/19/2015    Procedure: VIDEO BRONCHOSCOPY WITH FLUORO;  Surgeon: Rigoberto Noel, MD;  Location: [facility];  Service: Cardiopulmonary;  Laterality: Bilateral;  . Colonoscopy w/ polypectomy    . Video bronchoscopy with endobronchial ultrasound Right 09/08/2015    Procedure: ATTEMPTED VIDEO BRONCHOSCOPY ENDOBRONCHIAL ULTRASOUND  ;  Surgeon: Rigoberto Noel, MD;  Location: [facility];  Service: Thoracic;  Laterality: Right;  . Video bronchoscopy N/A 09/08/2015    Procedure: VIDEO BRONCHOSCOPY WITH FLUORO;  Surgeon: Rigoberto Noel, MD;  Location: [facility];  Service: Thoracic;  Laterality: N/A;  . Mediastinoscopy N/A 09/25/2015    Procedure: MEDIASTINOSCOPY;  Surgeon: Melrose Nakayama, MD;  Location: [facility];  Service: Thoracic;  Laterality: N/A;    REVIEW OF SYSTEMS:  Constitutional: negative Eyes: negative Ears, nose, mouth, throat, and face: negative Respiratory: negative Cardiovascular: negative Gastrointestinal: negative Genitourinary:negative Integument/breast: negative Hematologic/lymphatic: negative Musculoskeletal:positive for back pain Neurological: negative Behavioral/Psych: negative Endocrine: negative Allergic/Immunologic: negative   PHYSICAL EXAMINATION: General appearance: alert, cooperative and no distress Head: Normocephalic, without obvious abnormality, atraumatic Neck: no adenopathy, no JVD, supple, symmetrical, trachea midline and thyroid not enlarged, symmetric, no tenderness/mass/nodules Lymph nodes: Cervical, supraclavicular, and axillary nodes normal. Resp: clear to auscultation bilaterally Back: symmetric, no curvature. ROM normal. No CVA tenderness. Cardio: regular rate and rhythm, S1, S2 normal, no murmur, click, rub or gallop GI: soft,  non-tender; bowel sounds normal; no masses,  no  organomegaly Extremities: extremities normal, atraumatic, no cyanosis or edema Neurologic: Alert and oriented X 3, normal strength and tone. Normal symmetric reflexes. Normal coordination and gait  ECOG PERFORMANCE STATUS: 0 - Asymptomatic  Blood pressure 143/81, pulse 86, temperature 98.6 F (37 C), temperature source Oral, resp. rate 18, height '5\' 3"'$  (1.6 m), weight 151 lb 9.6 oz (68.765 kg), last menstrual period 06/09/1998, SpO2 100 %.  LABORATORY DATA: Lab Results  Component Value Date   WBC 5.3 10/06/2015   HGB 11.6 10/06/2015   HCT 35.5 10/06/2015   MCV 92.2 10/06/2015   PLT 225 10/06/2015      Chemistry      Component Value Date/Time   NA 141 09/25/2015 0805   NA 143 09/01/2015 1417   K 4.0 09/25/2015 0805   K 4.3 09/01/2015 1417   CL 107 09/25/2015 0805   CO2 24 09/25/2015 0805   CO2 31* 09/01/2015 1417   BUN 16 09/25/2015 0805   BUN 13.0 09/01/2015 1417   CREATININE 1.06* 09/25/2015 0805   CREATININE 1.0 09/01/2015 1417      Component Value Date/Time   CALCIUM 9.8 09/25/2015 0805   CALCIUM 9.8 09/01/2015 1417   ALKPHOS 64 09/25/2015 0805   ALKPHOS 78 09/01/2015 1417   AST 20 09/25/2015 0805   AST 20 09/01/2015 1417   ALT 15 09/25/2015 0805   ALT 12 09/01/2015 1417   BILITOT 1.1 09/25/2015 0805   BILITOT 0.73 09/01/2015 1417       RADIOGRAPHIC STUDIES: Dg Chest 2 View  09/25/2015  CLINICAL DATA:  Right lower lobe lung mass. EXAM: CHEST  2 VIEW COMPARISON:  September 08, 2015 FINDINGS: Stable cardiomediastinal silhouette. No pneumothorax or pleural effusion is noted. Bony thorax is unremarkable. Left lung is clear. Stable right lower lobe opacity is noted consistent with a history of possible neoplasm or malignancy. IMPRESSION: Stable right lower lobe opacity is noted consistent with neoplasm or malignancy as noted on prior studies. Electronically Signed   By: Marijo Conception, M.D.   On: 09/25/2015 08:43   Mr Jeri Cos BT Contrast  09/09/2015  CLINICAL DATA:   70 year old female with stage IV lung cancer. Staging exam. Initial encounter. EXAM: MRI HEAD WITHOUT AND WITH CONTRAST TECHNIQUE: Multiplanar, multiecho pulse sequences of the brain and surrounding structures were obtained without and with intravenous contrast. CONTRAST:  79m MULTIHANCE GADOBENATE DIMEGLUMINE 529 MG/ML IV SOLN COMPARISON:  None. FINDINGS: No acute infarct or intracranial hemorrhage. No intracranial mass or abnormal enhancement. Several punctate/patchy white matter changes most notable frontal lobes which probably reflects result of small vessel disease. No age advanced atrophy or hydrocephalus. Major intracranial vascular structures are patent with left vertebral artery dominant in size. Paranasal sinus mucosal thickening most notable right sphenoid sinus. No orbital abnormality noted. Slightly small pituitary gland. Cerebellar tonsils slightly low lying but within the range of normal limits. IMPRESSION: No evidence of intracranial metastatic disease. Mild small vessel disease changes. Paranasal sinus mucosal thickening most notable right sphenoid sinus. Electronically Signed   By: SGenia DelM.D.   On: 09/09/2015 18:24   Dg Chest Port 1 View  09/08/2015  CLINICAL DATA:  Status post bronchoscopic biopsy EXAM: PORTABLE CHEST 1 VIEW COMPARISON:  08/19/2015 chest radiograph. FINDINGS: Stable cardiomediastinal silhouette with mild cardiomegaly. No pneumothorax. No pleural effusion. No pulmonary edema. Hazy right lower lung opacity. IMPRESSION: 1. No pneumothorax. 2. Hazy right lower lung opacity, not appreciably changed, in keeping with  atelectasis and known right lower lobe pulmonary nodule. Electronically Signed   By: Ilona Sorrel M.D.   On: 09/08/2015 09:48   Dg C-arm Bronchoscopy  09/08/2015  CLINICAL DATA:  C-ARM BRONCHOSCOPY Fluoroscopy was utilized by the requesting physician.  No radiographic interpretation.    ASSESSMENT AND PLAN: This is a very pleasant 70 years old Hispanic female  with stage IIIA/IV non-small cell lung cancer, adenocarcinoma diagnosed in April 2017.  I had a lengthy discussion today with the patient and her sister about her current disease status and treatment options. The lesion in the pubic ramus is suspicious but not confirmed to be malignant. I will ask pathology to send her tissue for molecular study as well as PDL 1 expression. I discussed with the patient her treatment options. I recommended for the patient a course of concurrent chemoradiation with weekly carboplatin for AUC of 2 and paclitaxel 45 MG/M2. I discussed with the patient adverse effect of the chemotherapy including but not limited to alopecia, myelosuppression, nausea and vomiting, peripheral neuropathy, liver or renal dysfunction. I will arrange for the patient to have a chemotherapy education class before starting the first dose of his chemotherapy. The patient has plans to travel to [place] in to see her family. She is scheduled to leave on 10/08/2015 and expected to stay there for 2 weeks. I will see her back for follow-up visit in 3 weeks for reevaluation and more discussion of her treatment options. The patient was advised to call immediately if she has any concerning symptoms in the interval. The patient voices understanding of current disease status and treatment options and is in agreement with the current care plan.  All questions were answered. The patient knows to call the clinic with any problems, questions or concerns. We can certainly see the patient much sooner if necessary.  I spent 15 minutes counseling the patient face to face. The total time spent in the appointment was 25 minutes.  Disclaimer: This note was dictated with voice recognition software. Similar sounding words can inadvertently be transcribed and may not be corrected upon review.

## 2015-10-06 NOTE — Telephone Encounter (Signed)
Gave and printed appt sched and avs for pt for may °

## 2015-10-06 NOTE — Progress Notes (Signed)
Oncology Nurse Navigator Documentation  Oncology Nurse Navigator Flowsheets 10/06/2015  Navigator Encounter Type Clinic/MDC  Patient Visit Type MedOnc  Treatment Phase Pre-Tx/Tx Discussion  Barriers/Navigation Needs Coordination of Care  Interventions Coordination of Care  Coordination of Care Appts  Acuity Level 1  Time Spent with Patient 36   Spoke with patient and family today at clinic.  She has tissue dx and Dr. Julien Nordmann updated her on next steps.  She will be traveling but will have follow up in 2 weeks.  I will follow up to make sure she is scheduled.    Per Dr. Julien Nordmann, I requested pathology to go for PDL1 and foundation one testing.

## 2015-10-14 ENCOUNTER — Encounter (HOSPITAL_COMMUNITY): Payer: Self-pay

## 2015-10-17 ENCOUNTER — Encounter (HOSPITAL_COMMUNITY): Payer: Self-pay

## 2015-10-17 LAB — ACID FAST CULTURE WITH REFLEXED SENSITIVITIES (MYCOBACTERIA): Acid Fast Culture: NEGATIVE

## 2015-10-20 ENCOUNTER — Other Ambulatory Visit (HOSPITAL_BASED_OUTPATIENT_CLINIC_OR_DEPARTMENT_OTHER): Payer: BLUE CROSS/BLUE SHIELD

## 2015-10-20 DIAGNOSIS — C3491 Malignant neoplasm of unspecified part of right bronchus or lung: Secondary | ICD-10-CM

## 2015-10-20 LAB — CBC WITH DIFFERENTIAL/PLATELET
BASO%: 0.9 % (ref 0.0–2.0)
Basophils Absolute: 0 10*3/uL (ref 0.0–0.1)
EOS%: 2.4 % (ref 0.0–7.0)
Eosinophils Absolute: 0.1 10*3/uL (ref 0.0–0.5)
HCT: 36.5 % (ref 34.8–46.6)
HGB: 12 g/dL (ref 11.6–15.9)
LYMPH%: 30.2 % (ref 14.0–49.7)
MCH: 29.9 pg (ref 25.1–34.0)
MCHC: 33 g/dL (ref 31.5–36.0)
MCV: 90.4 fL (ref 79.5–101.0)
MONO#: 0.4 10*3/uL (ref 0.1–0.9)
MONO%: 8.6 % (ref 0.0–14.0)
NEUT#: 2.5 10*3/uL (ref 1.5–6.5)
NEUT%: 57.9 % (ref 38.4–76.8)
Platelets: 231 10*3/uL (ref 145–400)
RBC: 4.03 10*6/uL (ref 3.70–5.45)
RDW: 14 % (ref 11.2–14.5)
WBC: 4.3 10*3/uL (ref 3.9–10.3)
lymph#: 1.3 10*3/uL (ref 0.9–3.3)

## 2015-10-20 LAB — COMPREHENSIVE METABOLIC PANEL
ALT: 14 U/L (ref 0–55)
AST: 19 U/L (ref 5–34)
Albumin: 3.8 g/dL (ref 3.5–5.0)
Alkaline Phosphatase: 81 U/L (ref 40–150)
Anion Gap: 5 mEq/L (ref 3–11)
BUN: 13.7 mg/dL (ref 7.0–26.0)
CO2: 29 mEq/L (ref 22–29)
Calcium: 9.6 mg/dL (ref 8.4–10.4)
Chloride: 107 mEq/L (ref 98–109)
Creatinine: 1 mg/dL (ref 0.6–1.1)
EGFR: 59 mL/min/{1.73_m2} — ABNORMAL LOW (ref >90)
Glucose: 87 mg/dl (ref 70–140)
Potassium: 4.4 mEq/L (ref 3.5–5.1)
Sodium: 141 mEq/L (ref 136–145)
Total Bilirubin: 0.79 mg/dL (ref 0.20–1.20)
Total Protein: 7 g/dL (ref 6.4–8.3)

## 2015-10-23 LAB — FUNGUS CULTURE RESULT

## 2015-10-23 LAB — FUNGUS CULTURE WITH STAIN

## 2015-10-23 LAB — FUNGAL ORGANISM REFLEX

## 2015-10-27 ENCOUNTER — Encounter: Payer: Self-pay | Admitting: Internal Medicine

## 2015-10-27 ENCOUNTER — Telehealth: Payer: Self-pay | Admitting: Internal Medicine

## 2015-10-27 ENCOUNTER — Ambulatory Visit (HOSPITAL_BASED_OUTPATIENT_CLINIC_OR_DEPARTMENT_OTHER): Payer: BLUE CROSS/BLUE SHIELD | Admitting: Internal Medicine

## 2015-10-27 VITALS — BP 153/77 | HR 80 | Temp 98.5°F | Resp 18 | Ht 63.0 in | Wt 154.3 lb

## 2015-10-27 DIAGNOSIS — C3491 Malignant neoplasm of unspecified part of right bronchus or lung: Secondary | ICD-10-CM

## 2015-10-27 DIAGNOSIS — G8929 Other chronic pain: Secondary | ICD-10-CM

## 2015-10-27 DIAGNOSIS — C771 Secondary and unspecified malignant neoplasm of intrathoracic lymph nodes: Secondary | ICD-10-CM

## 2015-10-27 DIAGNOSIS — M549 Dorsalgia, unspecified: Secondary | ICD-10-CM

## 2015-10-27 DIAGNOSIS — C3431 Malignant neoplasm of lower lobe, right bronchus or lung: Secondary | ICD-10-CM

## 2015-10-27 NOTE — Telephone Encounter (Signed)
Pt requested copy of all her labs [id number] release id

## 2015-10-27 NOTE — Progress Notes (Signed)
[facility] Telephone:([phone]   Fax:(336) [place], [place] [place] [address]  DIAGNOSIS: Stage IIIA/IV non-small cell lung cancer, adenocarcinoma presenting with right lower lobe lung nodule in addition to questionable mediastinal lymphadenopathy as well as suspicious bone lesion in the pubic ramus diagnosed in March 2017.  Molecular studies: PDL 1 TPS  <1%.  [facility] One Studies: Positive for ERBB2 [id number]. Negative for EGFR, KRAS, ALK, BRAF, MET, RET and ROS1.  PRIOR THERAPY: None  CURRENT THERAPY: None  INTERVAL HISTORY: Valerie Long 70 y.o. female returns to the clinic today for follow-up visit accompanied by her sister. The patient is feeling fine today with no specific complaints except for chronic back pain and mild cough . She came back from trip to the [place] to visit her family. The patient denied having any significant chest pain, shortness of breath, or hemoptysis. She has no weight loss or night sweats. She denied having any fever or chills, no nausea or vomiting. The patient is here today for discussion of her treatment options. The molecular studies performed at [facility] one was negative for EGFR, ALK and ROS1 but positive for ERBB2 mutations. PDL 1 was <1%.  MEDICAL HISTORY: Past Medical History  Diagnosis Date  . Osteopenia   . Hypercholesterolemia   . 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 Adenocarcinoma of right lung, stage 4 ([place])   . Shortness of breath dyspnea   . Pneumonia   . Arthritis     ALLERGIES:  has No Known Allergies.  MEDICATIONS:  Current Outpatient Prescriptions  Medication Sig Dispense Refill  . aspirin EC 325 MG tablet Take 650 mg by mouth daily as needed for moderate pain.    Marland Kitchen betamethasone dipropionate (DIPROLENE) 0.05 % cream Apply 1 application topically 2 (two) times daily.    Marland Kitchen HYDROcodone-homatropine  (HYCODAN) 5-1.5 MG/5ML syrup Take 5 mLs by mouth every 6 (six) hours as needed for cough. 120 mL 0   No current facility-administered medications for this visit.    SURGICAL HISTORY:  Past Surgical History  Procedure Laterality Date  . Cesarean section      myomectomy  . Video bronchoscopy Bilateral 08/19/2015    Procedure: VIDEO BRONCHOSCOPY WITH FLUORO;  Surgeon: Rigoberto Noel, MD;  Location: [facility];  Service: Cardiopulmonary;  Laterality: Bilateral;  . Colonoscopy w/ polypectomy    . Video bronchoscopy with endobronchial ultrasound Right 09/08/2015    Procedure: ATTEMPTED VIDEO BRONCHOSCOPY ENDOBRONCHIAL ULTRASOUND  ;  Surgeon: Rigoberto Noel, MD;  Location: [facility];  Service: Thoracic;  Laterality: Right;  . Video bronchoscopy N/A 09/08/2015    Procedure: VIDEO BRONCHOSCOPY WITH FLUORO;  Surgeon: Rigoberto Noel, MD;  Location: [facility];  Service: Thoracic;  Laterality: N/A;  . Mediastinoscopy N/A 09/25/2015    Procedure: MEDIASTINOSCOPY;  Surgeon: Melrose Nakayama, MD;  Location: [facility];  Service: Thoracic;  Laterality: N/A;    REVIEW OF SYSTEMS:  Constitutional: negative Eyes: negative Ears, nose, mouth, throat, and face: negative Respiratory: positive for cough Cardiovascular: negative Gastrointestinal: negative Genitourinary:negative Integument/breast: negative Hematologic/lymphatic: negative Musculoskeletal:positive for back pain Neurological: negative Behavioral/Psych: negative Endocrine: negative Allergic/Immunologic: negative   PHYSICAL EXAMINATION: General appearance: alert, cooperative and no distress Head: Normocephalic, without obvious abnormality, atraumatic Neck: no adenopathy, no JVD, supple, symmetrical, trachea midline and thyroid not enlarged, symmetric, no tenderness/mass/nodules Lymph nodes: Cervical, supraclavicular, and  axillary nodes normal. Resp: clear to auscultation bilaterally Back: symmetric, no curvature. ROM normal. No CVA tenderness. Cardio:  regular rate and rhythm, S1, S2 normal, no murmur, click, rub or gallop GI: soft, non-tender; bowel sounds normal; no masses,  no organomegaly Extremities: extremities normal, atraumatic, no cyanosis or edema Neurologic: Alert and oriented X 3, normal strength and tone. Normal symmetric reflexes. Normal coordination and gait  ECOG PERFORMANCE STATUS: 0 - Asymptomatic  Blood pressure 153/77, pulse 80, temperature 98.5 F (36.9 C), temperature source Oral, resp. rate 18, height 5' 3" (1.6 m), weight 154 lb 4.8 oz (69.99 kg), last menstrual period 06/09/1998, SpO2 100 %.  LABORATORY DATA: Lab Results  Component Value Date   WBC 4.3 10/20/2015   HGB 12.0 10/20/2015   HCT 36.5 10/20/2015   MCV 90.4 10/20/2015   PLT 231 10/20/2015      Chemistry      Component Value Date/Time   NA 141 10/20/2015 1021   NA 141 09/25/2015 0805   K 4.4 10/20/2015 1021   K 4.0 09/25/2015 0805   CL 107 09/25/2015 0805   CO2 29 10/20/2015 1021   CO2 24 09/25/2015 0805   BUN 13.7 10/20/2015 1021   BUN 16 09/25/2015 0805   CREATININE 1.0 10/20/2015 1021   CREATININE 1.06* 09/25/2015 0805      Component Value Date/Time   CALCIUM 9.6 10/20/2015 1021   CALCIUM 9.8 09/25/2015 0805   ALKPHOS 81 10/20/2015 1021   ALKPHOS 64 09/25/2015 0805   AST 19 10/20/2015 1021   AST 20 09/25/2015 0805   ALT 14 10/20/2015 1021   ALT 15 09/25/2015 0805   BILITOT 0.79 10/20/2015 1021   BILITOT 1.1 09/25/2015 0805       RADIOGRAPHIC STUDIES: No results found.  ASSESSMENT AND PLAN: This is a very pleasant 70 years old Hispanic female with stage IIIA non-small cell lung cancer, adenocarcinoma with Positive for ERBB2 [id number]. Negative for EGFR, KRAS, ALK, BRAF, MET, RET and ROS1. PDL1 <1%,  diagnosed in April 2017.  I had a lengthy discussion today with the patient and her sister about her current disease status and treatment options. The lesion in the pubic ramus is suspicious but not confirmed to be  malignant. I recommended for the patient a course of concurrent chemoradiation with weekly carboplatin for AUC of 2 and paclitaxel 45 MG/M2. I discussed with the patient adverse effect of the chemotherapy including but not limited to alopecia, myelosuppression, nausea and vomiting, peripheral neuropathy, liver or renal dysfunction. The patient mentions that she does not have a lot of family support in [place] area and she may move to [place] to be close to several family members and receive her treatment there. She is currently in the process of getting an appointment there. I strongly encouraged the patient to start treatment as soon as possible in [place] or [place]. She will call me in the next few days if she is planning to receive her treatment locally in [place]. The patient was advised to call immediately if she has any concerning symptoms in the interval. The patient voices understanding of current disease status and treatment options and is in agreement with the current care plan.  All questions were answered. The patient knows to call the clinic with any problems, questions or concerns. We can certainly see the patient much sooner if necessary.  I spent 15 minutes counseling the patient face to face. The total time spent in the appointment was 25 minutes.  Disclaimer: This  note was dictated with voice recognition software. Similar sounding words can inadvertently be transcribed and may not be corrected upon review.

## 2015-11-07 DIAGNOSIS — C3491 Malignant neoplasm of unspecified part of right bronchus or lung: Secondary | ICD-10-CM | POA: Diagnosis not present

## 2015-11-08 LAB — ACID FAST CULTURE WITH REFLEXED SENSITIVITIES (MYCOBACTERIA): Acid Fast Culture: NEGATIVE

## 2015-11-10 DIAGNOSIS — C3491 Malignant neoplasm of unspecified part of right bronchus or lung: Secondary | ICD-10-CM | POA: Diagnosis not present

## 2015-11-14 DIAGNOSIS — C3491 Malignant neoplasm of unspecified part of right bronchus or lung: Secondary | ICD-10-CM | POA: Diagnosis not present

## 2015-11-14 DIAGNOSIS — C3490 Malignant neoplasm of unspecified part of unspecified bronchus or lung: Secondary | ICD-10-CM | POA: Diagnosis not present

## 2015-11-21 DIAGNOSIS — C3490 Malignant neoplasm of unspecified part of unspecified bronchus or lung: Secondary | ICD-10-CM | POA: Diagnosis not present

## 2015-11-21 DIAGNOSIS — C3491 Malignant neoplasm of unspecified part of right bronchus or lung: Secondary | ICD-10-CM | POA: Diagnosis not present

## 2015-11-21 DIAGNOSIS — C7951 Secondary malignant neoplasm of bone: Secondary | ICD-10-CM | POA: Diagnosis not present

## 2015-11-21 DIAGNOSIS — Z5111 Encounter for antineoplastic chemotherapy: Secondary | ICD-10-CM | POA: Diagnosis not present

## 2015-11-27 ENCOUNTER — Telehealth: Payer: Self-pay | Admitting: *Deleted

## 2015-11-27 NOTE — Telephone Encounter (Signed)
Oncology Nurse Navigator Documentation  Oncology Nurse Navigator Flowsheets 11/27/2015  Navigator Encounter Type Telephone  Telephone Outgoing Call  Barriers/Navigation Needs Coordination of Care  Interventions Coordination of Care  Acuity Level 1  Time Spent with Patient 15   I noticed patient is not receiving treatment here and called to check on her. Patient's family states she is receiving treatment in [place].  I listened as they explained.  I ask for her to call me if needed.

## 2015-11-28 DIAGNOSIS — C3490 Malignant neoplasm of unspecified part of unspecified bronchus or lung: Secondary | ICD-10-CM | POA: Diagnosis not present

## 2015-11-28 DIAGNOSIS — C7951 Secondary malignant neoplasm of bone: Secondary | ICD-10-CM | POA: Diagnosis not present

## 2015-11-28 DIAGNOSIS — C3491 Malignant neoplasm of unspecified part of right bronchus or lung: Secondary | ICD-10-CM | POA: Diagnosis not present

## 2015-11-28 LAB — CBC AND DIFFERENTIAL
HCT: 34 % — AB (ref 36–46)
Hemoglobin: 12 g/dL (ref 12.0–16.0)
Neutrophils Absolute: 3 /uL
Platelets: 187 10*3/uL (ref 150–399)
WBC: 5.1 10^3/mL

## 2015-11-28 LAB — BASIC METABOLIC PANEL
BUN: 14 mg/dL (ref 4–21)
Creatinine: 0.8 mg/dL (ref 0.5–1.1)
Glucose: 84 mg/dL
Potassium: 4.8 mmol/L (ref 3.4–5.3)
Sodium: 134 mmol/L — AB (ref 137–147)

## 2015-11-28 LAB — HEPATIC FUNCTION PANEL
ALT: 21 U/L (ref 7–35)
AST: 22 U/L (ref 13–35)
Alkaline Phosphatase: 98 U/L (ref 25–125)
Bilirubin, Total: 0.9 mg/dL

## 2015-12-04 ENCOUNTER — Encounter: Payer: Self-pay | Admitting: Internal Medicine

## 2015-12-12 DIAGNOSIS — R5383 Other fatigue: Secondary | ICD-10-CM | POA: Diagnosis not present

## 2015-12-12 DIAGNOSIS — C3431 Malignant neoplasm of lower lobe, right bronchus or lung: Secondary | ICD-10-CM | POA: Diagnosis not present

## 2015-12-12 DIAGNOSIS — R918 Other nonspecific abnormal finding of lung field: Secondary | ICD-10-CM | POA: Diagnosis not present

## 2015-12-12 DIAGNOSIS — Z5181 Encounter for therapeutic drug level monitoring: Secondary | ICD-10-CM | POA: Diagnosis not present

## 2015-12-12 DIAGNOSIS — C3490 Malignant neoplasm of unspecified part of unspecified bronchus or lung: Secondary | ICD-10-CM | POA: Diagnosis not present

## 2015-12-12 DIAGNOSIS — Z5111 Encounter for antineoplastic chemotherapy: Secondary | ICD-10-CM | POA: Diagnosis not present

## 2015-12-12 DIAGNOSIS — C7951 Secondary malignant neoplasm of bone: Secondary | ICD-10-CM | POA: Diagnosis not present

## 2015-12-12 DIAGNOSIS — Z79899 Other long term (current) drug therapy: Secondary | ICD-10-CM | POA: Diagnosis not present

## 2015-12-15 DIAGNOSIS — M25511 Pain in right shoulder: Secondary | ICD-10-CM | POA: Diagnosis not present

## 2015-12-15 DIAGNOSIS — I499 Cardiac arrhythmia, unspecified: Secondary | ICD-10-CM | POA: Diagnosis not present

## 2015-12-15 DIAGNOSIS — M25512 Pain in left shoulder: Secondary | ICD-10-CM | POA: Diagnosis not present

## 2015-12-15 DIAGNOSIS — R079 Chest pain, unspecified: Secondary | ICD-10-CM | POA: Diagnosis not present

## 2015-12-15 DIAGNOSIS — R0789 Other chest pain: Secondary | ICD-10-CM | POA: Diagnosis not present

## 2015-12-15 DIAGNOSIS — I208 Other forms of angina pectoris: Secondary | ICD-10-CM | POA: Diagnosis not present

## 2015-12-15 DIAGNOSIS — M542 Cervicalgia: Secondary | ICD-10-CM | POA: Diagnosis not present

## 2015-12-15 DIAGNOSIS — R918 Other nonspecific abnormal finding of lung field: Secondary | ICD-10-CM | POA: Diagnosis not present

## 2015-12-16 DIAGNOSIS — M25511 Pain in right shoulder: Secondary | ICD-10-CM | POA: Diagnosis not present

## 2015-12-16 DIAGNOSIS — M542 Cervicalgia: Secondary | ICD-10-CM | POA: Diagnosis not present

## 2015-12-16 DIAGNOSIS — R0789 Other chest pain: Secondary | ICD-10-CM | POA: Diagnosis not present

## 2015-12-16 DIAGNOSIS — M25512 Pain in left shoulder: Secondary | ICD-10-CM | POA: Diagnosis not present

## 2016-01-02 DIAGNOSIS — C3400 Malignant neoplasm of unspecified main bronchus: Secondary | ICD-10-CM | POA: Diagnosis not present

## 2016-01-02 DIAGNOSIS — C3491 Malignant neoplasm of unspecified part of right bronchus or lung: Secondary | ICD-10-CM | POA: Diagnosis not present

## 2016-01-02 DIAGNOSIS — C3490 Malignant neoplasm of unspecified part of unspecified bronchus or lung: Secondary | ICD-10-CM | POA: Diagnosis not present

## 2016-01-02 DIAGNOSIS — Z5111 Encounter for antineoplastic chemotherapy: Secondary | ICD-10-CM | POA: Diagnosis not present

## 2016-01-23 DIAGNOSIS — Z5111 Encounter for antineoplastic chemotherapy: Secondary | ICD-10-CM | POA: Diagnosis not present

## 2016-01-23 DIAGNOSIS — C3431 Malignant neoplasm of lower lobe, right bronchus or lung: Secondary | ICD-10-CM | POA: Diagnosis not present

## 2016-01-23 DIAGNOSIS — Z79899 Other long term (current) drug therapy: Secondary | ICD-10-CM | POA: Diagnosis not present

## 2016-01-23 DIAGNOSIS — C3491 Malignant neoplasm of unspecified part of right bronchus or lung: Secondary | ICD-10-CM | POA: Diagnosis not present

## 2016-01-23 DIAGNOSIS — R911 Solitary pulmonary nodule: Secondary | ICD-10-CM | POA: Diagnosis not present

## 2016-01-23 DIAGNOSIS — R110 Nausea: Secondary | ICD-10-CM | POA: Diagnosis not present

## 2016-01-23 DIAGNOSIS — C3490 Malignant neoplasm of unspecified part of unspecified bronchus or lung: Secondary | ICD-10-CM | POA: Diagnosis not present

## 2016-01-23 DIAGNOSIS — C7951 Secondary malignant neoplasm of bone: Secondary | ICD-10-CM | POA: Diagnosis not present

## 2016-01-23 DIAGNOSIS — Z5181 Encounter for therapeutic drug level monitoring: Secondary | ICD-10-CM | POA: Diagnosis not present

## 2016-01-23 LAB — HEPATIC FUNCTION PANEL
ALT: 21 U/L (ref 7–35)
AST: 27 U/L (ref 13–35)
Alkaline Phosphatase: 67 U/L (ref 25–125)
Bilirubin, Total: 0.6 mg/dL

## 2016-01-23 LAB — BASIC METABOLIC PANEL
BUN: 17 mg/dL (ref 4–21)
Creatinine: 0.9 mg/dL (ref 0.5–1.1)
Glucose: 170 mg/dL
Potassium: 3.7 mmol/L (ref 3.4–5.3)
Sodium: 138 mmol/L (ref 137–147)

## 2016-01-23 LAB — CBC AND DIFFERENTIAL
HCT: 28 % — AB (ref 36–46)
Hemoglobin: 9.6 g/dL — AB (ref 12.0–16.0)
Neutrophils Absolute: 6 /uL
Platelets: 157 10*3/uL (ref 150–399)
WBC: 6.9 10^3/mL

## 2016-01-30 ENCOUNTER — Encounter: Payer: Self-pay | Admitting: Internal Medicine

## 2016-02-03 ENCOUNTER — Emergency Department (HOSPITAL_COMMUNITY)
Admission: EM | Admit: 2016-02-03 | Discharge: 2016-02-03 | Disposition: A | Discharge status: Home / Self Care | Payer: Medicare Other | Attending: Emergency Medicine | Admitting: Emergency Medicine

## 2016-02-03 ENCOUNTER — Encounter (HOSPITAL_COMMUNITY): Payer: Self-pay | Admitting: Emergency Medicine

## 2016-02-03 DIAGNOSIS — X58XXXA Exposure to other specified factors, initial encounter: Secondary | ICD-10-CM | POA: Diagnosis not present

## 2016-02-03 DIAGNOSIS — Y939 Activity, unspecified: Secondary | ICD-10-CM | POA: Insufficient documentation

## 2016-02-03 DIAGNOSIS — Z79899 Other long term (current) drug therapy: Secondary | ICD-10-CM | POA: Diagnosis not present

## 2016-02-03 DIAGNOSIS — Y929 Unspecified place or not applicable: Secondary | ICD-10-CM | POA: Insufficient documentation

## 2016-02-03 DIAGNOSIS — Z85118 Personal history of other malignant neoplasm of bronchus and lung: Secondary | ICD-10-CM | POA: Diagnosis not present

## 2016-02-03 DIAGNOSIS — M25511 Pain in right shoulder: Secondary | ICD-10-CM | POA: Insufficient documentation

## 2016-02-03 DIAGNOSIS — Y999 Unspecified external cause status: Secondary | ICD-10-CM | POA: Insufficient documentation

## 2016-02-03 DIAGNOSIS — S161XXA Strain of muscle, fascia and tendon at neck level, initial encounter: Secondary | ICD-10-CM | POA: Insufficient documentation

## 2016-02-03 DIAGNOSIS — S199XXA Unspecified injury of neck, initial encounter: Secondary | ICD-10-CM | POA: Diagnosis present

## 2016-02-03 MED ORDER — TRAMADOL HCL 50 MG PO TABS: 50.0000 mg | ORAL_TABLET | Freq: Four times a day (QID) | ORAL | 0 refills | Status: DC | PRN

## 2016-02-03 MED ORDER — METHOCARBAMOL 500 MG PO TABS: 500.0000 mg | ORAL_TABLET | Freq: Every evening | ORAL | 0 refills | Status: DC | PRN

## 2016-02-03 NOTE — ED Provider Notes (Signed)
[facility] DEPT Provider Note   CSN: [id number] Arrival date & time: 02/03/16  1657     History   Chief Complaint Chief Complaint  Patient presents with  . Neck Pain  . Shoulder Pain    HPI Valerie Long is a 70 y.o. female who presents with right sided neck and shoulder pain. PMH significant for lung cancer and recurrent neck pain and shoulder pain which she states she has after her chemo treatments. Her last treatment was on 8/18. She states over the past 10 days she has had right sided neck pain and right shoulder and upper arm pain. Worse with neck extension. She usually has this pain after her chemo treatments however it only lasts ~3 days. She recently took a 12 hour train ride from [place] to [place] which she thinks may have exacerbated her pain. She has been taking Tylenol and using heating pads with mild relief. She tried taking a Tramadol that her sister gave her and that provided moderate relief. Denies headache, acute injury to head or neck, paresthesias, vision changes, N/V, previous hx of spine disorders.  HPI  Past Medical History:  Diagnosis Date  . Adenocarcinoma of right lung, stage 4 ([place]) 2017  . Arthritis   . Hypercholesterolemia   . Osteopenia   . 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 Pneumonia   . Shortness of breath dyspnea     Patient Active Problem List   Diagnosis Date Noted  . Adenocarcinoma of right lung, stage 3 ([place])   . Lung mass 07/08/2015  . PCP NOTES >>> 03/22/2015  . CTS (carpal tunnel syndrome) 12/06/2013  . Annual physical exam 08/29/2012  . Pain in joint, shoulder region 08/29/2012  . Vaginal atrophy 06/17/2011  . Osteopenia   . Hypercholesterolemia     Past Surgical History:  Procedure Laterality Date  . CESAREAN SECTION     myomectomy  . COLONOSCOPY W/ POLYPECTOMY    . MEDIASTINOSCOPY N/A 09/25/2015   Procedure: MEDIASTINOSCOPY;  Surgeon: Melrose Nakayama, MD;  Location: [facility];  Service: Thoracic;  Laterality:  N/A;  . VIDEO BRONCHOSCOPY Bilateral 08/19/2015   Procedure: VIDEO BRONCHOSCOPY WITH FLUORO;  Surgeon: Rigoberto Noel, MD;  Location: [facility];  Service: Cardiopulmonary;  Laterality: Bilateral;  . VIDEO BRONCHOSCOPY N/A 09/08/2015   Procedure: VIDEO BRONCHOSCOPY WITH FLUORO;  Surgeon: Rigoberto Noel, MD;  Location: [facility];  Service: Thoracic;  Laterality: N/A;  . VIDEO BRONCHOSCOPY WITH ENDOBRONCHIAL ULTRASOUND Right 09/08/2015   Procedure: ATTEMPTED VIDEO BRONCHOSCOPY ENDOBRONCHIAL ULTRASOUND  ;  Surgeon: Rigoberto Noel, MD;  Location: [facility];  Service: Thoracic;  Laterality: Right;    OB History    Gravida Para Term Preterm AB Living   1 0     1 0   SAB TAB Ectopic Multiple Live Births   1               Home Medications    Prior to Admission medications   Medication Sig Start Date End Date Taking? Authorizing Provider  acetaminophen (TYLENOL) 500 MG tablet Take 1,000 mg by mouth every 6 (six) hours as needed for moderate pain or headache.   Yes Historical Provider, MD  Cyanocobalamin (B-12 PO) Take 2 tablets by mouth daily.   Yes Historical Provider, MD  dexamethasone (DECADRON) 4 MG tablet Take 4 tablets by mouth 2 (two) times daily. Take '4mg'$  by mouth twice daily the day before, of, and after chemo 11/14/15  Yes  Historical Provider, MD  PRESCRIPTION MEDICATION Inject 1 Dose as directed every 21 ( twenty-one) days. Chemo   Yes Historical Provider, MD  prochlorperazine (COMPAZINE) 10 MG tablet Take 10 mg by mouth every 6 (six) hours as needed for nausea.  11/14/15  Yes Historical Provider, MD  HYDROcodone-homatropine (HYCODAN) 5-1.5 MG/5ML syrup Take 5 mLs by mouth every 6 (six) hours as needed for cough. Patient not taking: Reported on 02/03/2016 10/01/15   Melrose Nakayama, MD  methocarbamol (ROBAXIN) 500 MG tablet Take 1 tablet (500 mg total) by mouth at bedtime and may repeat dose one time if needed. 02/03/16   Recardo Evangelist, PA-C  traMADol (ULTRAM) 50 MG tablet Take 1 tablet (50 mg  total) by mouth every 6 (six) hours as needed. 02/03/16   Recardo Evangelist, PA-C    Family History Family History  Problem Relation Age of Onset  . Hypertension Mother     F and M  . CAD Father   . Diabetes Other     auncle-aunts   . Cancer Other     UTERINE   . Stroke Neg Hx   . Breast cancer Neg Hx   . Colon cancer Neg Hx     Social History Social History  Substance Use Topics  . Smoking status: Never Smoker  . Smokeless tobacco: Never Used  . Alcohol use 0.0 oz/week     Comment: SOCIAL     Allergies   Review of patient's allergies indicates no known allergies.   Review of Systems Review of Systems  Musculoskeletal: Positive for myalgias, neck pain and neck stiffness. Negative for arthralgias, back pain and gait problem.  Neurological: Negative for numbness.  All other systems reviewed and are negative.    Physical Exam Updated Vital Signs BP 135/74 (BP Location: Left Arm)   Pulse 95   Temp 97.6 F (36.4 C) (Oral)   Resp 18   Ht '5\' 9"'$  (1.753 m)   Wt 70.3 kg   LMP 06/09/1998   SpO2 100%   BMI 22.89 kg/m   Physical Exam  Constitutional: She is oriented to person, place, and time. She appears well-developed and well-nourished. No distress.  HENT:  Head: Normocephalic and atraumatic.  Eyes: Conjunctivae are normal. Pupils are equal, round, and reactive to light. Right eye exhibits no discharge. Left eye exhibits no discharge. No scleral icterus.  Neck: Normal range of motion. Neck supple.  No midline tenderness. Mild tenderness over cervical paraspinal muscles. Pain with neck extension. No pain with rotation or flexion  Cardiovascular: Normal rate, regular rhythm and intact distal pulses.  Exam reveals no gallop and no friction rub.   No murmur heard. Pulmonary/Chest: Effort normal and breath sounds normal. No respiratory distress.  Abdominal: Soft. She exhibits no distension. There is no tenderness.  Musculoskeletal: She exhibits no edema.  Tenderness  of right trapezius and shoulder. FROM of shoulder with mild pain elicited. 5/5 strength. N/V intact  Neurological: She is alert and oriented to person, place, and time.  Skin: Skin is warm and dry.  Psychiatric: She has a normal mood and affect. Her behavior is normal.  Nursing note and vitals reviewed.    ED Treatments / Results  Labs (all labs ordered are listed, but only abnormal results are displayed) Labs Reviewed - No data to display  EKG  EKG Interpretation None       Radiology No results found.  Procedures Procedures (including critical care time)  Medications Ordered in ED Medications -  No data to display   Initial Impression / Assessment and Plan / ED Course  I have reviewed the triage vital signs and the nursing notes.  Pertinent labs & imaging results that were available during my care of the patient were reviewed by me and considered in my medical decision making (see chart for details).  Clinical Course   70 year old female presents with neck and shoulder pain. Appears to be purely muscular. No midline tenderness of hx of trauma. Since conservative and OTC therapy has failed will rx Tramadol and Robaxin. Advised short course of Ibuprofen. Advised follow up with PCP. Patient is NAD, non-toxic, with stable VS. Patient is informed of clinical course, understands medical decision making process, and agrees with plan. Opportunity for questions provided and all questions answered. Return precautions given.   Final Clinical Impressions(s) / ED Diagnoses   Final diagnoses:  Cervical strain, acute, initial encounter    New Prescriptions New Prescriptions   METHOCARBAMOL (ROBAXIN) 500 MG TABLET    Take 1 tablet (500 mg total) by mouth at bedtime and may repeat dose one time if needed.   TRAMADOL (ULTRAM) 50 MG TABLET    Take 1 tablet (50 mg total) by mouth every 6 (six) hours as needed.     Recardo Evangelist, PA-C 02/03/16 [place],  MD 02/06/16 [phone]

## 2016-02-03 NOTE — ED Triage Notes (Signed)
Patient reports neck and right shoulder pain x1 1/2 weeks. Patient able to rotate neck from side to side and put chin to chest. Hx of lung cancer. Denies chest pain/shortness of breath.

## 2016-02-03 NOTE — Discharge Instructions (Signed)
Take Ibuprofen '400mg'$  three times a day for the next 5 days and then stop.

## 2016-02-13 DIAGNOSIS — C3490 Malignant neoplasm of unspecified part of unspecified bronchus or lung: Secondary | ICD-10-CM | POA: Diagnosis not present

## 2016-02-13 DIAGNOSIS — C3491 Malignant neoplasm of unspecified part of right bronchus or lung: Secondary | ICD-10-CM | POA: Diagnosis not present

## 2016-02-13 DIAGNOSIS — Z5111 Encounter for antineoplastic chemotherapy: Secondary | ICD-10-CM | POA: Diagnosis not present

## 2016-03-05 ENCOUNTER — Telehealth: Payer: Self-pay | Admitting: Medical Oncology

## 2016-03-05 DIAGNOSIS — C3431 Malignant neoplasm of lower lobe, right bronchus or lung: Secondary | ICD-10-CM | POA: Diagnosis not present

## 2016-03-05 DIAGNOSIS — Z5111 Encounter for antineoplastic chemotherapy: Secondary | ICD-10-CM | POA: Diagnosis not present

## 2016-03-05 DIAGNOSIS — C3491 Malignant neoplasm of unspecified part of right bronchus or lung: Secondary | ICD-10-CM | POA: Diagnosis not present

## 2016-03-05 DIAGNOSIS — R5383 Other fatigue: Secondary | ICD-10-CM | POA: Diagnosis not present

## 2016-03-05 DIAGNOSIS — C7951 Secondary malignant neoplasm of bone: Secondary | ICD-10-CM | POA: Diagnosis not present

## 2016-03-05 DIAGNOSIS — D6489 Other specified anemias: Secondary | ICD-10-CM | POA: Diagnosis not present

## 2016-03-05 DIAGNOSIS — Z418 Encounter for other procedures for purposes other than remedying health state: Secondary | ICD-10-CM | POA: Diagnosis not present

## 2016-03-05 DIAGNOSIS — Z79899 Other long term (current) drug therapy: Secondary | ICD-10-CM | POA: Diagnosis not present

## 2016-03-05 DIAGNOSIS — Z5181 Encounter for therapeutic drug level monitoring: Secondary | ICD-10-CM | POA: Diagnosis not present

## 2016-03-05 DIAGNOSIS — C3490 Malignant neoplasm of unspecified part of unspecified bronchus or lung: Secondary | ICD-10-CM | POA: Diagnosis not present

## 2016-03-05 DIAGNOSIS — R51 Headache: Secondary | ICD-10-CM | POA: Diagnosis not present

## 2016-03-05 NOTE — Telephone Encounter (Signed)
Received note from Dr. Trecia Rogers, [place] will be calling for f/u appt with [facility].

## 2016-03-06 DIAGNOSIS — R51 Headache: Secondary | ICD-10-CM | POA: Diagnosis not present

## 2016-03-06 DIAGNOSIS — C3490 Malignant neoplasm of unspecified part of unspecified bronchus or lung: Secondary | ICD-10-CM | POA: Diagnosis not present

## 2016-03-06 DIAGNOSIS — R937 Abnormal findings on diagnostic imaging of other parts of musculoskeletal system: Secondary | ICD-10-CM | POA: Diagnosis not present

## 2016-03-09 ENCOUNTER — Telehealth: Payer: Self-pay | Admitting: Internal Medicine

## 2016-03-09 NOTE — Telephone Encounter (Signed)
Patient called to schedule appointment. 03/09/16

## 2016-03-17 ENCOUNTER — Encounter: Payer: Self-pay | Admitting: Internal Medicine

## 2016-03-17 ENCOUNTER — Ambulatory Visit (HOSPITAL_BASED_OUTPATIENT_CLINIC_OR_DEPARTMENT_OTHER): Payer: Medicare Other | Admitting: Internal Medicine

## 2016-03-17 ENCOUNTER — Telehealth: Payer: Self-pay | Admitting: Internal Medicine

## 2016-03-17 ENCOUNTER — Telehealth: Payer: Self-pay | Admitting: Medical Oncology

## 2016-03-17 VITALS — BP 131/75 | HR 86 | Temp 98.1°F | Resp 20 | Ht 69.0 in | Wt 162.4 lb

## 2016-03-17 DIAGNOSIS — C3491 Malignant neoplasm of unspecified part of right bronchus or lung: Secondary | ICD-10-CM

## 2016-03-17 DIAGNOSIS — C3431 Malignant neoplasm of lower lobe, right bronchus or lung: Secondary | ICD-10-CM

## 2016-03-17 DIAGNOSIS — C3490 Malignant neoplasm of unspecified part of unspecified bronchus or lung: Secondary | ICD-10-CM

## 2016-03-17 DIAGNOSIS — Z5111 Encounter for antineoplastic chemotherapy: Secondary | ICD-10-CM

## 2016-03-17 HISTORY — DX: Encounter for antineoplastic chemotherapy: Z51.11

## 2016-03-17 MED ORDER — FOLIC ACID 1 MG PO TABS: 1.0000 mg | ORAL_TABLET | Freq: Every day | ORAL | 3 refills | Status: DC

## 2016-03-17 NOTE — Progress Notes (Signed)
START ON PATHWAY REGIMEN - Non-Small Cell Lung  [id number]: Pemetrexed 500 mg/m2 q21 Days Until Progression or Unacceptable Toxicity   A cycle is every 21 days:     Pemetrexed (Alimta(R)) 500 mg/m2 in 100 mL NS IV over 10 minutes, manufacturer recommends not administering to patients with CrCl < 45 mL/min Dose Mod: None Additional Orders: Note: Patient to receive the following prior to the initiation of therapy: 1) Dexamethasone 4 mg orally twice daily x 6 doses.  First dose 24 hours before chemotherapy. 2) Folic acid >= 606 mcg orally daily.  First dose at least 5 days prior to the first dose of pemetrexed. 3) Vitamin B12 1,000 mcg intramuscularly every 9 weeks.  First dose at least 5 days prior to the first dose of pemetrexed.  **Always confirm dose/schedule in your pharmacy ordering system**    Patient Characteristics: Stage IV Metastatic, Non Squamous, Maintenance - Chemotherapy/Immunotherapy, PS = 0, 1, Initial Pemetrexed and Platinum Agent AJCC M Stage: 1 AJCC N Stage: 2 AJCC T Stage: 1b Current Disease Status: Distant Metastases AJCC Stage Grouping: IV Histology: Non Squamous Cell ROS1 Rearrangement Status: Negative T790M Mutation Status: Not Applicable - EGFR Mutation Negative/Unknown Other Mutations/Biomarkers: No Other Actionable Mutations PD-L1 Expression Status: Quantity Not Sufficient Chemotherapy/Immunotherapy LOT: Maintenance Chemotherapy/Immunotherapy Molecular Targeted Therapy: Not Appropriate ALK Translocation Status: Negative Would you be surprised if this patient died  in the next year? I would NOT be surprised if this patient died in the next year EGFR Mutation Status: Negative/Wild Type BRAF V600E Mutation Status: Negative Performance Status: PS = 0, 1  Intent of Therapy: Non-Curative / Palliative Intent, Discussed with Patient

## 2016-03-17 NOTE — Progress Notes (Signed)
Valerie Long [phone]   Fax:([phone]) [place], MD [address] [place] [address]  DIAGNOSIS: Stage IV (T1b, N2, M1b) non-small cell lung cancer, adenocarcinoma presenting with right lower lobe lung nodule in addition to questionable mediastinal lymphadenopathy as well as suspicious bone lesion in the pubic ramus diagnosed in March 2017.  Molecular studies: PDL 1 TPS  <1%.  Foundation One Studies: Positive for ERBB2 [id number]. Negative for EGFR, KRAS, ALK, BRAF, MET, RET and ROS1.  PRIOR THERAPY: Systemic chemotherapy with carbo 10 for AUC of 5 and Alimta 500 MG/M2 every 3 weeks, status post 6 cycles at [facility], last dose was given on 03/05/2016 with stable disease.  CURRENT THERAPY: Maintenance systemic chemotherapy with single agent Alimta 500 MG/M2 every 3 weeks. First dose 03/25/2016.  INTERVAL HISTORY: Valerie Long 70 y.o. female returns to the clinic today for follow-up visit. The patient was last seen on 10/27/2015. She was started on first-line systemic chemotherapy with carboplatin and Alimta status post 6 cycles, last cycle was given on 03/05/2016 at [facility]. She tolerated her treatment fairly well. Imaging studies with repeat CT scan of the chest, abdomen and pelvis after cycle #5 showed no evidence for disease progression. The patient preferred to continue her maintenance treatment close to home. She came today for evaluation and discussion of her treatment options. She denied having any significant fatigue or weakness. She has no nausea, vomiting, diarrhea or constipation. She denied having any significant weight loss or night sweats. She has no chest pain, shortness of breath, cough or hemoptysis.  MEDICAL HISTORY: Past Medical History:  Diagnosis Date  . Adenocarcinoma of right lung, stage 4 ([place]) 2017  . Arthritis   . Hypercholesterolemia   . Osteopenia    . 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 Pneumonia   . Shortness of breath dyspnea     ALLERGIES:  has No Known Allergies.  MEDICATIONS:  Current Outpatient Prescriptions  Medication Sig Dispense Refill  . acetaminophen (TYLENOL) 500 MG tablet Take 1,000 mg by mouth every 6 (six) hours as needed for moderate pain or headache.    . benzonatate (TESSALON) 100 MG capsule Take 100 mg by mouth 3 (three) times daily as needed. cough    . Cyanocobalamin (B-12 PO) Take 2 tablets by mouth daily.    Marland Kitchen dexamethasone (DECADRON) 4 MG tablet Take 4 tablets by mouth 2 (two) times daily. Take 20m by mouth twice daily the day before, of, and after chemo  2  . prochlorperazine (COMPAZINE) 10 MG tablet Take 10 mg by mouth every 6 (six) hours as needed for nausea.   2  . HYDROcodone-homatropine (HYCODAN) 5-1.5 MG/5ML syrup Take 5 mLs by mouth every 6 (six) hours as needed for cough. (Patient not taking: Reported on 03/17/2016) 120 mL 0  . methocarbamol (ROBAXIN) 500 MG tablet Take 1 tablet (500 mg total) by mouth at bedtime and may repeat dose one time if needed. (Patient not taking: Reported on 03/17/2016) 10 tablet 0  . ondansetron (ZOFRAN) 8 MG tablet Take 8 mg by mouth every 8 (eight) hours as needed.    .Marland KitchenPRESCRIPTION MEDICATION Inject 1 Dose as directed every 21 ( twenty-one) days. Chemo    . traMADol (ULTRAM) 50 MG tablet Take 1 tablet (50 mg total) by mouth every 6 (six) hours as needed. (Patient not taking: Reported on 03/17/2016)  10 tablet 0   No current facility-administered medications for this visit.     SURGICAL HISTORY:  Past Surgical History:  Procedure Laterality Date  . CESAREAN SECTION     myomectomy  . COLONOSCOPY W/ POLYPECTOMY    . MEDIASTINOSCOPY N/A 09/25/2015   Procedure: MEDIASTINOSCOPY;  Surgeon: Melrose Nakayama, MD;  Location: [facility];  Service: Thoracic;  Laterality: N/A;  . VIDEO BRONCHOSCOPY Bilateral 08/19/2015   Procedure: VIDEO BRONCHOSCOPY WITH  FLUORO;  Surgeon: Rigoberto Noel, MD;  Location: [facility];  Service: Cardiopulmonary;  Laterality: Bilateral;  . VIDEO BRONCHOSCOPY N/A 09/08/2015   Procedure: VIDEO BRONCHOSCOPY WITH FLUORO;  Surgeon: Rigoberto Noel, MD;  Location: [facility];  Service: Thoracic;  Laterality: N/A;  . VIDEO BRONCHOSCOPY WITH ENDOBRONCHIAL ULTRASOUND Right 09/08/2015   Procedure: ATTEMPTED VIDEO BRONCHOSCOPY ENDOBRONCHIAL ULTRASOUND  ;  Surgeon: Rigoberto Noel, MD;  Location: [facility];  Service: Thoracic;  Laterality: Right;    REVIEW OF SYSTEMS:  Constitutional: negative Eyes: negative Ears, nose, mouth, throat, and face: negative Respiratory: negative Cardiovascular: negative Gastrointestinal: negative Genitourinary:negative Integument/breast: negative Hematologic/lymphatic: negative Musculoskeletal:negative Neurological: negative Behavioral/Psych: negative Endocrine: negative Allergic/Immunologic: negative   PHYSICAL EXAMINATION: General appearance: alert, cooperative and no distress Head: Normocephalic, without obvious abnormality, atraumatic Neck: no adenopathy, no JVD, supple, symmetrical, trachea midline and thyroid not enlarged, symmetric, no tenderness/mass/nodules Lymph nodes: Cervical, supraclavicular, and axillary nodes normal. Resp: clear to auscultation bilaterally Back: symmetric, no curvature. ROM normal. No CVA tenderness. Cardio: regular rate and rhythm, S1, S2 normal, no murmur, click, rub or gallop GI: soft, non-tender; bowel sounds normal; no masses,  no organomegaly Extremities: extremities normal, atraumatic, no cyanosis or edema Neurologic: Alert and oriented X 3, normal strength and tone. Normal symmetric reflexes. Normal coordination and gait  ECOG PERFORMANCE STATUS: 0 - Asymptomatic  Blood pressure 131/75, pulse 86, temperature 98.1 F (36.7 C), temperature source Oral, resp. rate 20, height '5\' 9"'  (1.753 m), weight 162 lb 6.4 oz (73.7 kg), last menstrual period 06/09/1998, SpO2 100  %.  LABORATORY DATA: Lab Results  Component Value Date   WBC 6.9 01/23/2016   HGB 9.6 (A) 01/23/2016   HCT 28 (A) 01/23/2016   MCV 90.4 10/20/2015   PLT 157 01/23/2016      Chemistry      Component Value Date/Time   NA 138 01/23/2016   NA 141 10/20/2015 1021   K 3.7 01/23/2016   K 4.4 10/20/2015 1021   CL 107 09/25/2015 0805   CO2 29 10/20/2015 1021   BUN 17 01/23/2016   BUN 13.7 10/20/2015 1021   CREATININE 0.9 01/23/2016   CREATININE 1.0 10/20/2015 1021   GLU 170 01/23/2016      Component Value Date/Time   CALCIUM 9.6 10/20/2015 1021   ALKPHOS 67 01/23/2016   ALKPHOS 81 10/20/2015 1021   AST 27 01/23/2016   AST 19 10/20/2015 1021   ALT 21 01/23/2016   ALT 14 10/20/2015 1021   BILITOT 0.79 10/20/2015 1021       RADIOGRAPHIC STUDIES: No results found.  ASSESSMENT AND PLAN: This is a very pleasant 70 years old Hispanic female with stage IIIA non-small cell lung cancer, adenocarcinoma with Positive for ERBB2 [id number]. Negative for EGFR, KRAS, ALK, BRAF, MET, RET and ROS1. PDL1 <1%, diagnosed in April 2017.  She is status post 6 cycles of first-line chemotherapy with carboplatin and Alimta. She tolerated the treatment well with no significant adverse effects. Imaging studies after cycle #5 showed no evidence for disease  progression. I had a lengthy discussion with the patient today about her current condition and further treatment options. I recommended for the patient to consider maintenance systemic chemotherapy with single agent Alimta 500 MG/M2 every 3 weeks. She is expected to start the first cycle of this treatment on 03/25/2016. I discussed with the patient adverse effects of this treatment including but not limited to alopecia, myelosuppression, nausea and vomiting, peripheral neuropathy, liver or renal dysfunction. She will continue on vitamin B 12 injection 1000 g intramuscular every 9 weeks during her treatment. The patient will continue also on  Decadron 4 mg by mouth twice a day, the day before, day of and day after the chemotherapy in addition to folic acid 1 mg by mouth daily. She will come back for follow-up visit in 5 weeks for reevaluation before starting cycle #2. The patient was advised to call immediately if she has any concerning symptoms in the interval. The patient voices understanding of current disease status and treatment options and is in agreement with the current care plan.  All questions were answered. The patient knows to call the clinic with any problems, questions or concerns. We can certainly see the patient much sooner if necessary.  I spent 15 minutes counseling the patient face to face. The total time spent in the appointment was 25 minutes.  Disclaimer: This note was dictated with voice recognition software. Similar sounding words can inadvertently be transcribed and may not be corrected upon review.

## 2016-03-17 NOTE — Telephone Encounter (Signed)
Left message instructing pt to take dex 4 mg bid day before day of and day after chemo and folic acid every day

## 2016-03-17 NOTE — Telephone Encounter (Signed)
GAVE PATIENT AVS REPORT AND APPOINTMENTS FOR October THRU December.  PER PATIENT REQUEST LEFT MESSAGE FOR DESK NURSE ASKING IF SHE WILL NEED TO TAKE STEROIDS PRIOR TO TREATMENT AND ASKED THAT PATIENT BE CALLED.

## 2016-03-23 ENCOUNTER — Telehealth: Payer: Self-pay | Admitting: Medical Oncology

## 2016-03-23 ENCOUNTER — Other Ambulatory Visit: Payer: Self-pay | Admitting: Medical Oncology

## 2016-03-23 MED ORDER — DEXAMETHASONE 4 MG PO TABS: 16.0000 mg | ORAL_TABLET | Freq: Two times a day (BID) | ORAL | 2 refills | Status: DC

## 2016-03-23 NOTE — Telephone Encounter (Signed)
requested decadron refill. Done.

## 2016-03-23 NOTE — Progress Notes (Unsigned)
called in decadron premed for alimta

## 2016-03-25 ENCOUNTER — Other Ambulatory Visit (HOSPITAL_BASED_OUTPATIENT_CLINIC_OR_DEPARTMENT_OTHER): Payer: Medicare Other

## 2016-03-25 ENCOUNTER — Other Ambulatory Visit: Payer: Self-pay | Admitting: Medical Oncology

## 2016-03-25 ENCOUNTER — Ambulatory Visit (HOSPITAL_BASED_OUTPATIENT_CLINIC_OR_DEPARTMENT_OTHER): Payer: Medicare Other

## 2016-03-25 VITALS — BP 142/68 | HR 82 | Temp 98.1°F | Resp 18

## 2016-03-25 DIAGNOSIS — C3491 Malignant neoplasm of unspecified part of right bronchus or lung: Secondary | ICD-10-CM

## 2016-03-25 DIAGNOSIS — D649 Anemia, unspecified: Secondary | ICD-10-CM

## 2016-03-25 DIAGNOSIS — Z5111 Encounter for antineoplastic chemotherapy: Secondary | ICD-10-CM | POA: Diagnosis present

## 2016-03-25 LAB — CBC WITH DIFFERENTIAL/PLATELET
BASO%: 0 % (ref 0.0–2.0)
Basophils Absolute: 0 10*3/uL (ref 0.0–0.1)
EOS%: 0 % (ref 0.0–7.0)
Eosinophils Absolute: 0 10*3/uL (ref 0.0–0.5)
HCT: 23.7 % — ABNORMAL LOW (ref 34.8–46.6)
HGB: 7.8 g/dL — ABNORMAL LOW (ref 11.6–15.9)
LYMPH%: 11.8 % — ABNORMAL LOW (ref 14.0–49.7)
MCH: 35.1 pg — ABNORMAL HIGH (ref 25.1–34.0)
MCHC: 32.9 g/dL (ref 31.5–36.0)
MCV: 106.8 fL — ABNORMAL HIGH (ref 79.5–101.0)
MONO#: 0.7 10*3/uL (ref 0.1–0.9)
MONO%: 12.2 % (ref 0.0–14.0)
NEUT#: 4.5 10*3/uL (ref 1.5–6.5)
NEUT%: 76 % (ref 38.4–76.8)
Platelets: 132 10*3/uL — ABNORMAL LOW (ref 145–400)
RBC: 2.22 10*6/uL — ABNORMAL LOW (ref 3.70–5.45)
RDW: 17.5 % — ABNORMAL HIGH (ref 11.2–14.5)
WBC: 5.9 10*3/uL (ref 3.9–10.3)
lymph#: 0.7 10*3/uL — ABNORMAL LOW (ref 0.9–3.3)
nRBC: 0 % (ref 0–0)

## 2016-03-25 LAB — COMPREHENSIVE METABOLIC PANEL
ALT: 21 U/L (ref 0–55)
AST: 33 U/L (ref 5–34)
Albumin: 4 g/dL (ref 3.5–5.0)
Alkaline Phosphatase: 61 U/L (ref 40–150)
Anion Gap: 12 mEq/L — ABNORMAL HIGH (ref 3–11)
BUN: 22.6 mg/dL (ref 7.0–26.0)
CO2: 20 mEq/L — ABNORMAL LOW (ref 22–29)
Calcium: 9.3 mg/dL (ref 8.4–10.4)
Chloride: 107 mEq/L (ref 98–109)
Creatinine: 1 mg/dL (ref 0.6–1.1)
EGFR: 57 mL/min/{1.73_m2} — ABNORMAL LOW (ref >90)
Glucose: 107 mg/dl (ref 70–140)
Potassium: 4.2 mEq/L (ref 3.5–5.1)
Sodium: 140 mEq/L (ref 136–145)
Total Bilirubin: 0.61 mg/dL (ref 0.20–1.20)
Total Protein: 7.6 g/dL (ref 6.4–8.3)

## 2016-03-25 MED ORDER — SODIUM CHLORIDE 0.9 % IV SOLN
Freq: Once | INTRAVENOUS | Status: AC
  Administered 2016-03-25: 15:00:00 via INTRAVENOUS

## 2016-03-25 MED ORDER — SODIUM CHLORIDE 0.9 % IV SOLN
500.0000 mg/m2 | Freq: Once | INTRAVENOUS | Status: AC
  Administered 2016-03-25: 950 mg via INTRAVENOUS
  Filled 2016-03-25: qty 38

## 2016-03-25 MED ORDER — PROCHLORPERAZINE MALEATE 10 MG PO TABS
ORAL_TABLET | ORAL | Status: AC
  Filled 2016-03-25: qty 1

## 2016-03-25 MED ORDER — PROCHLORPERAZINE MALEATE 10 MG PO TABS
10.0000 mg | ORAL_TABLET | Freq: Once | ORAL | Status: AC
  Administered 2016-03-25: 10 mg via ORAL

## 2016-03-25 NOTE — Progress Notes (Signed)
Per Dr Julien Nordmann it is okay to treat pt today with alimta and hgb 7.8.Pt does not want blood transfusion. She is scheduled next week to recheck cbc/diff -Mohamed aware,

## 2016-03-25 NOTE — Progress Notes (Signed)
Okay to treat today, per Dr. Julien Nordmann with Hemglobin of 7.8. Dr. Julien Nordmann will schedule for blood transfusion tomorrow.  Patient does not want to get a blood transfusion, labs will be repeated next week per Dr. Julien Nordmann.

## 2016-03-25 NOTE — Patient Instructions (Signed)
[facility] Discharge Instructions for Patients Receiving Chemotherapy  Today you received the following chemotherapy agents: Alimta.  To help prevent nausea and vomiting after your treatment, we encourage you to take your nausea medication as directed.   If you develop nausea and vomiting that is not controlled by your nausea medication, call the clinic.   BELOW ARE SYMPTOMS THAT SHOULD BE REPORTED IMMEDIATELY:  *FEVER GREATER THAN 100.5 F  *CHILLS WITH OR WITHOUT FEVER  NAUSEA AND VOMITING THAT IS NOT CONTROLLED WITH YOUR NAUSEA MEDICATION  *UNUSUAL SHORTNESS OF BREATH  *UNUSUAL BRUISING OR BLEEDING  TENDERNESS IN MOUTH AND THROAT WITH OR WITHOUT PRESENCE OF ULCERS  *URINARY PROBLEMS  *BOWEL PROBLEMS  UNUSUAL RASH Items with * indicate a potential emergency and should be followed up as soon as possible.  Feel free to call the clinic you have any questions or concerns. The clinic phone number is [phone].  Please show the [place] at check-in to the Emergency Department and triage nurse.  Pemetrexed injection What is this medicine? PEMETREXED (PEM e TREX ed) is a chemotherapy drug. This medicine affects cells that are rapidly growing, such as cancer cells and cells in your mouth and stomach. It is usually used to treat lung cancers like non-small cell lung cancer and mesothelioma. It may also be used to treat other cancers. This medicine may be used for other purposes; ask your health care provider or pharmacist if you have questions. What should I tell my health care provider before I take this medicine? They need to know if you have any of these conditions: -if you frequently drink alcohol containing beverages -infection (especially a virus infection such as chickenpox, cold sores, or herpes) -kidney disease -liver disease -low blood counts, like low platelets, red bloods, or white blood cells -an unusual or allergic reaction to pemetrexed,  mannitol, other medicines, foods, dyes, or preservatives -pregnant or trying to get pregnant -breast-feeding How should I use this medicine? This drug is given as an infusion into a vein. It is administered in a hospital or clinic by a specially trained health care professional. Talk to your pediatrician regarding the use of this medicine in children. Special care may be needed. Overdosage: If you think you have taken too much of this medicine contact a poison control center or emergency room at once. NOTE: This medicine is only for you. Do not share this medicine with others. What if I miss a dose? It is important not to miss your dose. Call your doctor or health care professional if you are unable to keep an appointment. What may interact with this medicine? -aspirin and aspirin-like medicines -medicines to increase blood counts like filgrastim, pegfilgrastim, sargramostim -methotrexate -NSAIDS, medicines for pain and inflammation, like ibuprofen or naproxen -probenecid -pyrimethamine -vaccines Talk to your doctor or health care professional before taking any of these medicines: -acetaminophen -aspirin -ibuprofen -ketoprofen -naproxen This list may not describe all possible interactions. Give your health care provider a list of all the medicines, herbs, non-prescription drugs, or dietary supplements you use. Also tell them if you smoke, drink alcohol, or use illegal drugs. Some items may interact with your medicine. What should I watch for while using this medicine? Visit your doctor for checks on your progress. This drug may make you feel generally unwell. This is not uncommon, as chemotherapy can affect healthy cells as well as cancer cells. Report any side effects. Continue your course of treatment even though you feel ill unless  your doctor tells you to stop. In some cases, you may be given additional medicines to help with side effects. Follow all directions for their use. Call  your doctor or health care professional for advice if you get a fever, chills or sore throat, or other symptoms of a cold or flu. Do not treat yourself. This drug decreases your body's ability to fight infections. Try to avoid being around people who are sick. This medicine may increase your risk to bruise or bleed. Call your doctor or health care professional if you notice any unusual bleeding. Be careful brushing and flossing your teeth or using a toothpick because you may get an infection or bleed more easily. If you have any dental work done, tell your dentist you are receiving this medicine. Avoid taking products that contain aspirin, acetaminophen, ibuprofen, naproxen, or ketoprofen unless instructed by your doctor. These medicines may hide a fever. Call your doctor or health care professional if you get diarrhea or mouth sores. Do not treat yourself. To protect your kidneys, drink water or other fluids as directed while you are taking this medicine. Men and women must use effective birth control while taking this medicine. You may also need to continue using effective birth control for a time after stopping this medicine. Do not become pregnant while taking this medicine. Tell your doctor right away if you think that you or your partner might be pregnant. There is a potential for serious side effects to an unborn child. Talk to your health care professional or pharmacist for more information. Do not breast-feed an infant while taking this medicine. This medicine may lower sperm counts. What side effects may I notice from receiving this medicine? Side effects that you should report to your doctor or health care professional as soon as possible: -allergic reactions like skin rash, itching or hives, swelling of the face, lips, or tongue -low blood counts - this medicine may decrease the number of white blood cells, red blood cells and platelets. You may be at increased risk for infections and  bleeding. -signs of infection - fever or chills, cough, sore throat, pain or difficulty passing urine -signs of decreased platelets or bleeding - bruising, pinpoint red spots on the skin, black, tarry stools, blood in the urine -signs of decreased red blood cells - unusually weak or tired, fainting spells, lightheadedness -breathing problems, like a dry cough -changes in emotions or moods -chest pain -confusion -diarrhea -high blood pressure -mouth or throat sores or ulcers -pain, swelling, warmth in the leg -pain on swallowing -swelling of the ankles, feet, hands -trouble passing urine or change in the amount of urine -vomiting -yellowing of the eyes or skin Side effects that usually do not require medical attention (report to your doctor or health care professional if they continue or are bothersome): -hair loss -loss of appetite -nausea -stomach upset This list may not describe all possible side effects. Call your doctor for medical advice about side effects. You may report side effects to FDA at 1-800-FDA-1088. Where should I keep my medicine? This drug is given in a hospital or clinic and will not be stored at home. NOTE: This sheet is a summary. It may not cover all possible information. If you have questions about this medicine, talk to your doctor, pharmacist, or health care provider.    2016, Elsevier/Gold Standard. (2007-12-26 13:24:03)

## 2016-03-26 ENCOUNTER — Telehealth: Payer: Self-pay | Admitting: *Deleted

## 2016-03-26 NOTE — Telephone Encounter (Signed)
-----   Message from [name], RN sent at 03/25/2016  4:20 PM EDT ----- Regarding: First time chemo/ Dr. Julien Nordmann First time Valerie Long.

## 2016-03-26 NOTE — Telephone Encounter (Signed)
Chemotherapy follow up call to pt. Pt denies any concerns at this time. Reviewed pt's up coming lab appt date and time. No further questions.

## 2016-04-01 ENCOUNTER — Ambulatory Visit (HOSPITAL_COMMUNITY)
Admission: RE | Admit: 2016-04-01 | Discharge: 2016-04-01 | Disposition: A | Discharge status: Home / Self Care | Payer: Medicare Other | Source: Ambulatory Visit | Attending: Internal Medicine | Admitting: Internal Medicine

## 2016-04-01 ENCOUNTER — Other Ambulatory Visit (HOSPITAL_BASED_OUTPATIENT_CLINIC_OR_DEPARTMENT_OTHER): Payer: Medicare Other

## 2016-04-01 ENCOUNTER — Other Ambulatory Visit: Payer: Self-pay | Admitting: Medical Oncology

## 2016-04-01 DIAGNOSIS — D649 Anemia, unspecified: Secondary | ICD-10-CM | POA: Diagnosis not present

## 2016-04-01 DIAGNOSIS — C3491 Malignant neoplasm of unspecified part of right bronchus or lung: Secondary | ICD-10-CM

## 2016-04-01 LAB — CBC & DIFF AND RETIC
BASO%: 0 % (ref 0.0–2.0)
Basophils Absolute: 0 10*3/uL (ref 0.0–0.1)
EOS%: 0.5 % (ref 0.0–7.0)
Eosinophils Absolute: 0 10*3/uL (ref 0.0–0.5)
HCT: 22.2 % — ABNORMAL LOW (ref 34.8–46.6)
HGB: 7.4 g/dL — ABNORMAL LOW (ref 11.6–15.9)
Immature Retic Fract: 34.4 % — ABNORMAL HIGH (ref 1.60–10.00)
LYMPH%: 53.1 % — ABNORMAL HIGH (ref 14.0–49.7)
MCH: 35.2 pg — ABNORMAL HIGH (ref 25.1–34.0)
MCHC: 33.3 g/dL (ref 31.5–36.0)
MCV: 105.7 fL — ABNORMAL HIGH (ref 79.5–101.0)
MONO#: 0.3 10*3/uL (ref 0.1–0.9)
MONO%: 14.3 % — ABNORMAL HIGH (ref 0.0–14.0)
NEUT#: 0.6 10*3/uL — ABNORMAL LOW (ref 1.5–6.5)
NEUT%: 32.1 % — ABNORMAL LOW (ref 38.4–76.8)
Platelets: 268 10*3/uL (ref 145–400)
RBC: 2.1 10*6/uL — ABNORMAL LOW (ref 3.70–5.45)
RDW: 16.2 % — ABNORMAL HIGH (ref 11.2–14.5)
Retic %: 0.94 % (ref 0.70–2.10)
Retic Ct Abs: 19.74 10*3/uL — ABNORMAL LOW (ref 33.70–90.70)
WBC: 2 10*3/uL — ABNORMAL LOW (ref 3.9–10.3)
lymph#: 1 10*3/uL (ref 0.9–3.3)

## 2016-04-01 LAB — ABO/RH: ABO/RH(D): O POS

## 2016-04-02 ENCOUNTER — Ambulatory Visit (HOSPITAL_BASED_OUTPATIENT_CLINIC_OR_DEPARTMENT_OTHER): Payer: Medicare Other

## 2016-04-02 DIAGNOSIS — D649 Anemia, unspecified: Secondary | ICD-10-CM | POA: Diagnosis present

## 2016-04-02 LAB — PREPARE RBC (CROSSMATCH)

## 2016-04-02 MED ORDER — DIPHENHYDRAMINE HCL 25 MG PO CAPS
25.0000 mg | ORAL_CAPSULE | Freq: Once | ORAL | Status: AC
  Administered 2016-04-02: 25 mg via ORAL

## 2016-04-02 MED ORDER — DIPHENHYDRAMINE HCL 25 MG PO CAPS
ORAL_CAPSULE | ORAL | Status: AC
  Filled 2016-04-02: qty 1

## 2016-04-02 MED ORDER — ACETAMINOPHEN 325 MG PO TABS
650.0000 mg | ORAL_TABLET | Freq: Once | ORAL | Status: AC
  Administered 2016-04-02: 650 mg via ORAL

## 2016-04-02 MED ORDER — ACETAMINOPHEN 325 MG PO TABS
ORAL_TABLET | ORAL | Status: AC
  Filled 2016-04-02: qty 2

## 2016-04-02 MED ORDER — SODIUM CHLORIDE 0.9 % IV SOLN
250.0000 mL | Freq: Once | INTRAVENOUS | Status: AC
  Administered 2016-04-02: 250 mL via INTRAVENOUS

## 2016-04-02 NOTE — Patient Instructions (Signed)

## 2016-04-03 LAB — TYPE AND SCREEN
ABO/RH(D): O POS
Antibody Screen: NEGATIVE
Unit division: 0

## 2016-04-15 ENCOUNTER — Ambulatory Visit (HOSPITAL_BASED_OUTPATIENT_CLINIC_OR_DEPARTMENT_OTHER): Payer: Medicare Other | Admitting: Nurse Practitioner

## 2016-04-15 ENCOUNTER — Ambulatory Visit (HOSPITAL_BASED_OUTPATIENT_CLINIC_OR_DEPARTMENT_OTHER): Payer: Medicare Other

## 2016-04-15 ENCOUNTER — Other Ambulatory Visit (HOSPITAL_BASED_OUTPATIENT_CLINIC_OR_DEPARTMENT_OTHER): Payer: Medicare Other

## 2016-04-15 VITALS — BP 159/71 | HR 80 | Temp 98.2°F | Resp 17 | Ht 69.0 in | Wt 162.5 lb

## 2016-04-15 DIAGNOSIS — C3431 Malignant neoplasm of lower lobe, right bronchus or lung: Secondary | ICD-10-CM

## 2016-04-15 DIAGNOSIS — Z5111 Encounter for antineoplastic chemotherapy: Secondary | ICD-10-CM

## 2016-04-15 DIAGNOSIS — C3491 Malignant neoplasm of unspecified part of right bronchus or lung: Secondary | ICD-10-CM

## 2016-04-15 LAB — COMPREHENSIVE METABOLIC PANEL
ALT: 24 U/L (ref 0–55)
AST: 31 U/L (ref 5–34)
Albumin: 4 g/dL (ref 3.5–5.0)
Alkaline Phosphatase: 66 U/L (ref 40–150)
Anion Gap: 11 mEq/L (ref 3–11)
BUN: 24.1 mg/dL (ref 7.0–26.0)
CO2: 23 mEq/L (ref 22–29)
Calcium: 9.8 mg/dL (ref 8.4–10.4)
Chloride: 107 mEq/L (ref 98–109)
Creatinine: 1 mg/dL (ref 0.6–1.1)
EGFR: 58 mL/min/{1.73_m2} — ABNORMAL LOW (ref >90)
Glucose: 108 mg/dl (ref 70–140)
Potassium: 4 mEq/L (ref 3.5–5.1)
Sodium: 140 mEq/L (ref 136–145)
Total Bilirubin: 0.57 mg/dL (ref 0.20–1.20)
Total Protein: 7.8 g/dL (ref 6.4–8.3)

## 2016-04-15 LAB — CBC WITH DIFFERENTIAL/PLATELET
BASO%: 0.1 % (ref 0.0–2.0)
Basophils Absolute: 0 10*3/uL (ref 0.0–0.1)
EOS%: 0.1 % (ref 0.0–7.0)
Eosinophils Absolute: 0 10*3/uL (ref 0.0–0.5)
HCT: 31 % — ABNORMAL LOW (ref 34.8–46.6)
HGB: 10.3 g/dL — ABNORMAL LOW (ref 11.6–15.9)
LYMPH%: 8.2 % — ABNORMAL LOW (ref 14.0–49.7)
MCH: 33.4 pg (ref 25.1–34.0)
MCHC: 33.2 g/dL (ref 31.5–36.0)
MCV: 100.6 fL (ref 79.5–101.0)
MONO#: 0.5 10*3/uL (ref 0.1–0.9)
MONO%: 5.7 % (ref 0.0–14.0)
NEUT#: 7.4 10*3/uL — ABNORMAL HIGH (ref 1.5–6.5)
NEUT%: 85.9 % — ABNORMAL HIGH (ref 38.4–76.8)
Platelets: 329 10*3/uL (ref 145–400)
RBC: 3.08 10*6/uL — ABNORMAL LOW (ref 3.70–5.45)
RDW: 19.5 % — ABNORMAL HIGH (ref 11.2–14.5)
WBC: 8.6 10*3/uL (ref 3.9–10.3)
lymph#: 0.7 10*3/uL — ABNORMAL LOW (ref 0.9–3.3)

## 2016-04-15 MED ORDER — SODIUM CHLORIDE 0.9 % IV SOLN
500.0000 mg/m2 | Freq: Once | INTRAVENOUS | Status: AC
  Administered 2016-04-15: 950 mg via INTRAVENOUS
  Filled 2016-04-15: qty 38

## 2016-04-15 MED ORDER — PROCHLORPERAZINE MALEATE 10 MG PO TABS
10.0000 mg | ORAL_TABLET | Freq: Once | ORAL | Status: AC
  Administered 2016-04-15: 10 mg via ORAL

## 2016-04-15 MED ORDER — PROCHLORPERAZINE MALEATE 10 MG PO TABS
ORAL_TABLET | ORAL | Status: AC
  Filled 2016-04-15: qty 1

## 2016-04-15 MED ORDER — SODIUM CHLORIDE 0.9 % IV SOLN
Freq: Once | INTRAVENOUS | Status: AC
  Administered 2016-04-15: 15:00:00 via INTRAVENOUS

## 2016-04-15 NOTE — Patient Instructions (Signed)
[facility] Discharge Instructions for Patients Receiving Chemotherapy  Today you received the following chemotherapy agents Alimta  To help prevent nausea and vomiting after your treatment, we encourage you to take your nausea medication   If you develop nausea and vomiting that is not controlled by your nausea medication, call the clinic.   BELOW ARE SYMPTOMS THAT SHOULD BE REPORTED IMMEDIATELY:  *FEVER GREATER THAN 100.5 F  *CHILLS WITH OR WITHOUT FEVER  NAUSEA AND VOMITING THAT IS NOT CONTROLLED WITH YOUR NAUSEA MEDICATION  *UNUSUAL SHORTNESS OF BREATH  *UNUSUAL BRUISING OR BLEEDING  TENDERNESS IN MOUTH AND THROAT WITH OR WITHOUT PRESENCE OF ULCERS  *URINARY PROBLEMS  *BOWEL PROBLEMS  UNUSUAL RASH Items with * indicate a potential emergency and should be followed up as soon as possible.  Feel free to call the clinic you have any questions or concerns. The clinic phone number is [phone].  Please show the CHEMO ALERT CARD at check-in to the Emergency Department and triage nurse.   

## 2016-04-15 NOTE — Progress Notes (Addendum)
[facility] OFFICE PROGRESS NOTE   DIAGNOSIS: Stage IV (T1b, N2, M1b) non-small cell lung cancer, adenocarcinoma presenting with right lower lobe lung nodule in addition to questionable mediastinal lymphadenopathy as well as suspicious bone lesion in the pubic ramus diagnosed in March 2017.  Molecular studies: PDL 1 TPS  <1%.  [employer] Studies: Positive for ERBB2 [id number]. Negative for EGFR, KRAS, ALK, BRAF, MET, RET and ROS1.  PRIOR THERAPY: Systemic chemotherapy with carbo 10 for AUC of 5 and Alimta 500 MG/M2 every 3 weeks, status post 6 cycles at [facility], last dose was given on 03/05/2016 with stable disease.  CURRENT THERAPY: Maintenance systemic chemotherapy with single agent Alimta 500 MG/M2 every 3 weeks. First dose 03/25/2016.  INTERVAL HISTORY:   Valerie Long returns as scheduled. She completed cycle 1 maintenance Alimta 03/25/2016. She denies significant nausea/vomiting. No mouth sores. No diarrhea or constipation. No rash. No shortness of breath, cough or fever. She reports a good appetite. She reports receiving a bone strengthener, possibly Zometa, when she was being treated at [facility].  Objective:  Vital signs in last 24 hours:  Blood pressure (!) 159/71, pulse 80, temperature 98.2 F (36.8 C), temperature source Oral, resp. rate 17, height 5' 9" (1.753 m), weight 162 lb 8 oz (73.7 kg), last menstrual period 06/09/1998, SpO2 100 %.    HEENT: No thrush or ulcers.  Lymphatics: No palpable cervical or supraclavicular lymph nodes. Resp: Lungs clear bilaterally. Cardio: Regular rate and rhythm. GI: Abdomen soft and nontender. No hepatomegaly. Vascular: No leg edema. Calves soft and nontender. Skin: No rash.    Lab Results:  Lab Results  Component Value Date   WBC 8.6 04/15/2016   HGB 10.3 (L) 04/15/2016   HCT 31.0 (L) 04/15/2016   MCV 100.6 04/15/2016   PLT 329 04/15/2016   NEUTROABS 7.4 (H) 04/15/2016     Imaging:  No results found.  Medications: I have reviewed the patient's current medications.  Assessment/Plan: 1. Stage IIIA non-small cell lung cancer, adenocarcinoma with positive for ERBB2 [id number]. Negative for EGFR, KRAS, ALK, BRAF, MET, RET and ROS1. PDL1 <1%, diagnosed in April 2017; status post 6 cycles of first-line chemotherapy with carboplatin and Alimta. Imaging studies after cycle #5 showed no evidence for disease progression. Initiation of maintenance Alimta 03/25/2016. 2. Anemia status post red cell transfusion support 04/02/2016.   Disposition: Valerie Long appears stable. She has completed 1 cycle of maintenance Alimta. Plan to proceed with cycle 2 today as scheduled.  Dr. Julien Nordmann discussed Zometa with Valerie Long at today's visit. She understands the potential for osteonecrosis of the jaw. Zometa will be given every 9-12 weeks beginning 05/06/2016. She will begin calcium and vitamin D.  She will return for a follow-up visit and cycle 3 maintenance Alimta on 05/06/2016. She will contact the office in the interim with any problems.  Patient seen with Dr. Julien Nordmann.  Ned Card ANP/GNP-BC   04/15/2016  2:07 PM   ADDENDUM: Hematology/Oncology Attending: I had a face to face encounter with the patient. I recommended her care plan. This is a very pleasant 70 years old Hispanic female with metastatic non-small cell lung cancer status post 6 cycles of systemic chemotherapy with carboplatin and Alimta with stable disease after cycle #6. She is currently undergoing maintenance systemic chemotherapy with single agent Alimta status post 1 cycle. She is tolerating her treatment well with no significant adverse effects. She received 2 units of PRBCs transfusion recently secondary to anemia of neoplastic disease.  I  will also continue the patient on Zometa every 9-12 weeks she was advised of the potential adverse effect of this treatment including osteonecrosis of the jaw  and the importance to keep good dental hygiene and follow-up with her dentist.  She will come back for follow-up visit in 3 weeks with the start of cycle #3 of her treatment.  The patient was advised to call immediately if she has any concerning symptoms in the interval.   Disclaimer: This note was dictated with voice recognition software. Similar sounding words can inadvertently be transcribed and may be missed upon review. Valerie Long., MD 04/19/16

## 2016-04-19 ENCOUNTER — Encounter: Payer: Self-pay | Admitting: Nurse Practitioner

## 2016-05-06 ENCOUNTER — Other Ambulatory Visit (HOSPITAL_BASED_OUTPATIENT_CLINIC_OR_DEPARTMENT_OTHER): Payer: Medicare Other

## 2016-05-06 ENCOUNTER — Ambulatory Visit (HOSPITAL_BASED_OUTPATIENT_CLINIC_OR_DEPARTMENT_OTHER): Payer: Medicare Other | Admitting: Internal Medicine

## 2016-05-06 ENCOUNTER — Encounter: Payer: Self-pay | Admitting: Internal Medicine

## 2016-05-06 ENCOUNTER — Ambulatory Visit (HOSPITAL_BASED_OUTPATIENT_CLINIC_OR_DEPARTMENT_OTHER): Payer: Medicare Other

## 2016-05-06 VITALS — BP 154/72 | HR 81 | Temp 97.7°F | Resp 18 | Ht 69.0 in | Wt 161.7 lb

## 2016-05-06 DIAGNOSIS — C3431 Malignant neoplasm of lower lobe, right bronchus or lung: Secondary | ICD-10-CM

## 2016-05-06 DIAGNOSIS — M8580 Other specified disorders of bone density and structure, unspecified site: Secondary | ICD-10-CM

## 2016-05-06 DIAGNOSIS — I10 Essential (primary) hypertension: Secondary | ICD-10-CM | POA: Diagnosis not present

## 2016-05-06 DIAGNOSIS — C3491 Malignant neoplasm of unspecified part of right bronchus or lung: Secondary | ICD-10-CM

## 2016-05-06 DIAGNOSIS — Z5111 Encounter for antineoplastic chemotherapy: Secondary | ICD-10-CM

## 2016-05-06 DIAGNOSIS — R530 Neoplastic (malignant) related fatigue: Secondary | ICD-10-CM | POA: Diagnosis not present

## 2016-05-06 DIAGNOSIS — D6481 Anemia due to antineoplastic chemotherapy: Secondary | ICD-10-CM

## 2016-05-06 LAB — COMPREHENSIVE METABOLIC PANEL
ALT: 34 U/L (ref 0–55)
AST: 44 U/L — ABNORMAL HIGH (ref 5–34)
Albumin: 3.9 g/dL (ref 3.5–5.0)
Alkaline Phosphatase: 62 U/L (ref 40–150)
Anion Gap: 11 mEq/L (ref 3–11)
BUN: 23.4 mg/dL (ref 7.0–26.0)
CO2: 22 mEq/L (ref 22–29)
Calcium: 10 mg/dL (ref 8.4–10.4)
Chloride: 107 mEq/L (ref 98–109)
Creatinine: 1.1 mg/dL (ref 0.6–1.1)
EGFR: 53 mL/min/{1.73_m2} — ABNORMAL LOW (ref >90)
Glucose: 108 mg/dl (ref 70–140)
Potassium: 3.5 mEq/L (ref 3.5–5.1)
Sodium: 140 mEq/L (ref 136–145)
Total Bilirubin: 0.7 mg/dL (ref 0.20–1.20)
Total Protein: 7.8 g/dL (ref 6.4–8.3)

## 2016-05-06 LAB — CBC WITH DIFFERENTIAL/PLATELET
BASO%: 0 % (ref 0.0–2.0)
Basophils Absolute: 0 10*3/uL (ref 0.0–0.1)
EOS%: 0 % (ref 0.0–7.0)
Eosinophils Absolute: 0 10*3/uL (ref 0.0–0.5)
HCT: 32.6 % — ABNORMAL LOW (ref 34.8–46.6)
HGB: 10.7 g/dL — ABNORMAL LOW (ref 11.6–15.9)
LYMPH%: 12 % — ABNORMAL LOW (ref 14.0–49.7)
MCH: 32.6 pg (ref 25.1–34.0)
MCHC: 32.8 g/dL (ref 31.5–36.0)
MCV: 99.4 fL (ref 79.5–101.0)
MONO#: 0.6 10*3/uL (ref 0.1–0.9)
MONO%: 7 % (ref 0.0–14.0)
NEUT#: 7.2 10*3/uL — ABNORMAL HIGH (ref 1.5–6.5)
NEUT%: 81 % — ABNORMAL HIGH (ref 38.4–76.8)
Platelets: 252 10*3/uL (ref 145–400)
RBC: 3.28 10*6/uL — ABNORMAL LOW (ref 3.70–5.45)
RDW: 17.1 % — ABNORMAL HIGH (ref 11.2–14.5)
WBC: 8.9 10*3/uL (ref 3.9–10.3)
lymph#: 1.1 10*3/uL (ref 0.9–3.3)

## 2016-05-06 MED ORDER — ZOLEDRONIC ACID 4 MG/100ML IV SOLN
4.0000 mg | Freq: Once | INTRAVENOUS | Status: AC
  Administered 2016-05-06: 4 mg via INTRAVENOUS
  Filled 2016-05-06: qty 100

## 2016-05-06 MED ORDER — CYANOCOBALAMIN 1000 MCG/ML IJ SOLN
1000.0000 ug | Freq: Once | INTRAMUSCULAR | Status: AC
  Administered 2016-05-06: 1000 ug via INTRAMUSCULAR

## 2016-05-06 MED ORDER — PEMETREXED DISODIUM CHEMO INJECTION 500 MG
500.0000 mg/m2 | Freq: Once | INTRAVENOUS | Status: AC
  Administered 2016-05-06: 950 mg via INTRAVENOUS
  Filled 2016-05-06: qty 38

## 2016-05-06 MED ORDER — SODIUM CHLORIDE 0.9 % IV SOLN
Freq: Once | INTRAVENOUS | Status: AC
  Administered 2016-05-06: 13:00:00 via INTRAVENOUS

## 2016-05-06 MED ORDER — CYANOCOBALAMIN 1000 MCG/ML IJ SOLN
INTRAMUSCULAR | Status: AC
  Filled 2016-05-06: qty 1

## 2016-05-06 MED ORDER — PROCHLORPERAZINE MALEATE 10 MG PO TABS
10.0000 mg | ORAL_TABLET | Freq: Once | ORAL | Status: AC
  Administered 2016-05-06: 10 mg via ORAL

## 2016-05-06 MED ORDER — PROCHLORPERAZINE MALEATE 10 MG PO TABS
ORAL_TABLET | ORAL | Status: AC
  Filled 2016-05-06: qty 1

## 2016-05-06 NOTE — Patient Instructions (Signed)
[facility] Discharge Instructions for Patients Receiving Chemotherapy  Today you received the following chemotherapy agents Alimta To help prevent nausea and vomiting after your treatment, we encourage you to take your nausea medication as prescribed.   If you develop nausea and vomiting that is not controlled by your nausea medication, call the clinic.   BELOW ARE SYMPTOMS THAT SHOULD BE REPORTED IMMEDIATELY:  *FEVER GREATER THAN 100.5 F  *CHILLS WITH OR WITHOUT FEVER  NAUSEA AND VOMITING THAT IS NOT CONTROLLED WITH YOUR NAUSEA MEDICATION  *UNUSUAL SHORTNESS OF BREATH  *UNUSUAL BRUISING OR BLEEDING  TENDERNESS IN MOUTH AND THROAT WITH OR WITHOUT PRESENCE OF ULCERS  *URINARY PROBLEMS  *BOWEL PROBLEMS  UNUSUAL RASH Items with * indicate a potential emergency and should be followed up as soon as possible.  Feel free to call the clinic you have any questions or concerns. The clinic phone number is [phone].  Please show the [place] at check-in to the Emergency Department and triage nurse.   Zoledronic Acid injection (Hypercalcemia, Oncology) (Zometa) What is this medicine? ZOLEDRONIC ACID (ZOE le dron ik AS id) lowers the amount of calcium loss from bone. It is used to treat too much calcium in your blood from cancer. It is also used to prevent complications of cancer that has spread to the bone. This medicine may be used for other purposes; ask your health care provider or pharmacist if you have questions. COMMON BRAND NAME(S): Zometa What should I tell my health care provider before I take this medicine? They need to know if you have any of these conditions: -aspirin-sensitive asthma -cancer, especially if you are receiving medicines used to treat cancer -dental disease or wear dentures -infection -kidney disease -receiving corticosteroids like dexamethasone or prednisone -an unusual or allergic reaction to zoledronic acid, other medicines,  foods, dyes, or preservatives -pregnant or trying to get pregnant -breast-feeding How should I use this medicine? This medicine is for infusion into a vein. It is given by a health care professional in a hospital or clinic setting. Talk to your pediatrician regarding the use of this medicine in children. Special care may be needed. Overdosage: If you think you have taken too much of this medicine contact a poison control center or emergency room at once. NOTE: This medicine is only for you. Do not share this medicine with others. What if I miss a dose? It is important not to miss your dose. Call your doctor or health care professional if you are unable to keep an appointment. What may interact with this medicine? -certain antibiotics given by injection -NSAIDs, medicines for pain and inflammation, like ibuprofen or naproxen -some diuretics like bumetanide, furosemide -teriparatide -thalidomide This list may not describe all possible interactions. Give your health care provider a list of all the medicines, herbs, non-prescription drugs, or dietary supplements you use. Also tell them if you smoke, drink alcohol, or use illegal drugs. Some items may interact with your medicine. What should I watch for while using this medicine? Visit your doctor or health care professional for regular checkups. It may be some time before you see the benefit from this medicine. Do not stop taking your medicine unless your doctor tells you to. Your doctor may order blood tests or other tests to see how you are doing. Women should inform their doctor if they wish to become pregnant or think they might be pregnant. There is a potential for serious side effects to an unborn child. Talk to  your health care professional or pharmacist for more information. You should make sure that you get enough calcium and vitamin D while you are taking this medicine. Discuss the foods you eat and the vitamins you take with your health  care professional. Some people who take this medicine have severe bone, joint, and/or muscle pain. This medicine may also increase your risk for jaw problems or a broken thigh bone. Tell your doctor right away if you have severe pain in your jaw, bones, joints, or muscles. Tell your doctor if you have any pain that does not go away or that gets worse. Tell your dentist and dental surgeon that you are taking this medicine. You should not have major dental surgery while on this medicine. See your dentist to have a dental exam and fix any dental problems before starting this medicine. Take good care of your teeth while on this medicine. Make sure you see your dentist for regular follow-up appointments. What side effects may I notice from receiving this medicine? Side effects that you should report to your doctor or health care professional as soon as possible: -allergic reactions like skin rash, itching or hives, swelling of the face, lips, or tongue -anxiety, confusion, or depression -breathing problems -changes in vision -eye pain -feeling faint or lightheaded, falls -jaw pain, especially after dental work -mouth sores -muscle cramps, stiffness, or weakness -redness, blistering, peeling or loosening of the skin, including inside the mouth -trouble passing urine or change in the amount of urine Side effects that usually do not require medical attention (report to your doctor or health care professional if they continue or are bothersome): -bone, joint, or muscle pain -constipation -diarrhea -fever -hair loss -irritation at site where injected -loss of appetite -nausea, vomiting -stomach upset -trouble sleeping -trouble swallowing -weak or tired This list may not describe all possible side effects. Call your doctor for medical advice about side effects. You may report side effects to FDA at 1-800-FDA-1088. Where should I keep my medicine? This drug is given in a hospital or clinic and  will not be stored at home. NOTE: This sheet is a summary. It may not cover all possible information. If you have questions about this medicine, talk to your doctor, pharmacist, or health care provider.  2017 Elsevier/Gold Standard (2013-10-20 14:19:39)

## 2016-05-06 NOTE — Progress Notes (Signed)
[place] [phone]   Fax:([phone] [place], MD [address] [place] [address]  DIAGNOSIS: Stage IV (T1b, N2, M1b) non-small cell lung cancer, adenocarcinoma presenting with right lower lobe lung nodule in addition to questionable mediastinal lymphadenopathy as well as suspicious bone lesion in the pubic ramus diagnosed in March 2017.  Molecular studies: PDL 1 TPS  <1%.  Foundation One Studies: Positive for ERBB2 [id number]. Negative for EGFR, KRAS, ALK, BRAF, MET, RET and ROS1.  PRIOR THERAPY: Systemic chemotherapy with carbo 10 for AUC of 5 and Alimta 500 MG/M2 every 3 weeks, status post 6 cycles at [facility], last dose was given on 03/05/2016 with stable disease.  CURRENT THERAPY: Maintenance systemic chemotherapy with single agent Alimta 500 MG/M2 every 3 weeks. First dose 03/25/2016. Status post 2 cycles.  INTERVAL HISTORY: Valerie Long 70 y.o. female with a stage IV non-small cell lung cancer for returns to the clinic today for evaluation. She is here today accompanied by her sister. She enjoyed her Thanksgiving with her family. The patient is currently undergoing maintenance systemic chemotherapy with single agent Alimta. She tolerated the last cycle of her treatment well. She continues to complain of fatigue. She was treated for chemotherapy-induced anemia recently and her hemoglobin and hematocrit are much better. She was not taking folic acid as prescribed until recently. She has no current nausea or vomiting. She has no fever or chills. No significant change in her breathing problem.  MEDICAL HISTORY: Past Medical History:  Diagnosis Date  . Adenocarcinoma of right lung, stage 4 ([place]) 2017  . Adenocarcinoma of right lung, stage 4 ([place])   . Arthritis   . Encounter for antineoplastic chemotherapy 03/17/2016  . Hypercholesterolemia   . Osteopenia   . Pelvic kidney    Left.  [place] in [place]  . Pneumonia   . Shortness of breath dyspnea     ALLERGIES:  has No Known Allergies.  MEDICATIONS:  Current Outpatient Prescriptions  Medication Sig Dispense Refill  . Cyanocobalamin (B-12 PO) Take 2 tablets by mouth daily.    Marland Kitchen dexamethasone (DECADRON) 4 MG tablet Take 4 tablets (16 mg total) by mouth 2 (two) times daily. Take 26m by mouth twice daily the day before, of, and after chemo (Patient taking differently: Take 4 mg by mouth 2 (two) times daily. Take 465mby mouth twice daily the day before, of, and after chemo) 30 tablet 2  . folic acid (FOLVITE) 1 MG tablet Take 1 tablet (1 mg total) by mouth daily. 30 tablet 3  . PRESCRIPTION MEDICATION Inject 1 Dose as directed every 21 ( twenty-one) days. Chemo    . prochlorperazine (COMPAZINE) 10 MG tablet Take 10 mg by mouth every 6 (six) hours as needed for nausea.   2  . acetaminophen (TYLENOL) 500 MG tablet Take 1,000 mg by mouth every 6 (six) hours as needed for moderate pain or headache.    . benzonatate (TESSALON) 100 MG capsule Take 100 mg by mouth 3 (three) times daily as needed. cough    . Fish Oil-Cholecalciferol (OMEGA-3 + VITAMIN D3 PO) Take 1 tablet by mouth daily.    . Marland KitchenYDROcodone-homatropine (HYCODAN) 5-1.5 MG/5ML syrup Take 5 mLs by mouth every 6 (six) hours as needed for cough. (Patient not taking: Reported on 05/06/2016) 120 mL 0  . methocarbamol (ROBAXIN) 500 MG tablet Take 1 tablet (500 mg total) by mouth at bedtime  and may repeat dose one time if needed. (Patient not taking: Reported on 05/06/2016) 10 tablet 0  . ondansetron (ZOFRAN) 8 MG tablet Take 8 mg by mouth every 8 (eight) hours as needed.    . traMADol (ULTRAM) 50 MG tablet Take 1 tablet (50 mg total) by mouth every 6 (six) hours as needed. (Patient not taking: Reported on 05/06/2016) 10 tablet 0   No current facility-administered medications for this visit.     SURGICAL HISTORY:  Past Surgical History:  Procedure Laterality Date  .  CESAREAN SECTION     myomectomy  . COLONOSCOPY W/ POLYPECTOMY    . MEDIASTINOSCOPY N/A 09/25/2015   Procedure: MEDIASTINOSCOPY;  Surgeon: Melrose Nakayama, MD;  Location: [facility];  Service: Thoracic;  Laterality: N/A;  . VIDEO BRONCHOSCOPY Bilateral 08/19/2015   Procedure: VIDEO BRONCHOSCOPY WITH FLUORO;  Surgeon: Rigoberto Noel, MD;  Location: [facility];  Service: Cardiopulmonary;  Laterality: Bilateral;  . VIDEO BRONCHOSCOPY N/A 09/08/2015   Procedure: VIDEO BRONCHOSCOPY WITH FLUORO;  Surgeon: Rigoberto Noel, MD;  Location: [facility];  Service: Thoracic;  Laterality: N/A;  . VIDEO BRONCHOSCOPY WITH ENDOBRONCHIAL ULTRASOUND Right 09/08/2015   Procedure: ATTEMPTED VIDEO BRONCHOSCOPY ENDOBRONCHIAL ULTRASOUND  ;  Surgeon: Rigoberto Noel, MD;  Location: [facility];  Service: Thoracic;  Laterality: Right;    REVIEW OF SYSTEMS:  Constitutional: positive for fatigue and weight loss Eyes: negative for glaucoma, icterus and irritation Ears, nose, mouth, throat, and face: negative for ear drainage, epistaxis and hoarseness Respiratory: negative for cough, dyspnea on exertion and pleurisy/chest pain Cardiovascular: negative for chest pain, chest pressure/discomfort and exertional chest pressure/discomfort Gastrointestinal: negative for constipation, diarrhea, nausea and vomiting Genitourinary:negative Integument/breast: negative for dryness, pruritus and rash Hematologic/lymphatic: negative Musculoskeletal:negative for back pain, muscle weakness and neck pain Neurological: negative Behavioral/Psych: negative Endocrine: negative Allergic/Immunologic: negative   PHYSICAL EXAMINATION: General appearance: alert, cooperative, appears stated age and no distress Head: Normocephalic, without obvious abnormality Lymph nodes: Cervical, supraclavicular, and axillary nodes normal. Resp: clear to auscultation bilaterally and normal percussion bilaterally Back: symmetric, no curvature. ROM normal. No CVA  tenderness. Cardio: regular rate and rhythm and irregularly irregular rhythm GI: soft, non-tender; bowel sounds normal; no masses,  no organomegaly Extremities: no edema, redness or tenderness in the calves or thighs  ECOG PERFORMANCE STATUS: 0 - Asymptomatic  Blood pressure (!) 154/72, pulse 81, temperature 97.7 F (36.5 C), temperature source Oral, resp. rate 18, height '5\' 9"'  (1.753 m), weight 161 lb 11.2 oz (73.3 kg), last menstrual period 06/09/1998, SpO2 100 %.  LABORATORY DATA: Lab Results  Component Value Date   WBC 8.9 05/06/2016   HGB 10.7 (L) 05/06/2016   HCT 32.6 (L) 05/06/2016   MCV 99.4 05/06/2016   PLT 252 05/06/2016      Chemistry      Component Value Date/Time   NA 140 05/06/2016 1106   K 3.5 05/06/2016 1106   CL 107 09/25/2015 0805   CO2 22 05/06/2016 1106   BUN 23.4 05/06/2016 1106   CREATININE 1.1 05/06/2016 1106   GLU 170 01/23/2016      Component Value Date/Time   CALCIUM 10.0 05/06/2016 1106   ALKPHOS 62 05/06/2016 1106   AST 44 (H) 05/06/2016 1106   ALT 34 05/06/2016 1106   BILITOT 0.70 05/06/2016 1106       RADIOGRAPHIC STUDIES: No results found.  ASSESSMENT AND PLAN: This is a very pleasant 70 years old Hispanic female with   1) metastatic non-small cell lung cancer,  adenocarcinoma with Positive for ERBB2 [id number]. Negative for EGFR, KRAS, ALK, BRAF, MET, RET and ROS1. PDL1 <1%, diagnosed in April 2017.  She was treated at [facility] with 6 cycles of induction systemic chemotherapy with carboplatin and Alimta and the imaging studies after cycle #5 showed stable disease with no concerning findings for progression. She is currently undergoing maintenance systemic chemotherapy with single agent Alimta status post 2 cycles. She is tolerating her treatment well with no specific complaints except for fatigue. She was not taking her folic acid as prescribed. I strongly encouraged the patient to take folic acid 1 mg by mouth  daily. I recommended for her to proceed with cycle #3 today as scheduled. I would see her back for follow-up visit in 3 weeks for evaluation with repeat CT scan of the chest, abdomen and pelvis for restaging of her disease. 2) chemotherapy-induced anemia: Her hemoglobin and hematocrit are much better today. She received PRBCs transfusion a few weeks ago. I recommended for the patient to continue the treatment with folic acid.  3) hypertension: I strongly recommended for the patient to monitor her blood pressure at home and to reconsult with her primary care physician for antihypertensive medication if needed. The patient was advised to call immediately if she has any concerning symptoms in the interval. The patient voices understanding of current disease status and treatment options and is in agreement with the current care plan.  All questions were answered. The patient knows to call the clinic with any problems, questions or concerns. We can certainly see the patient much sooner if necessary.  Disclaimer: This note was dictated with voice recognition software. Similar sounding words can inadvertently be transcribed and may not be corrected upon review.

## 2016-05-17 ENCOUNTER — Telehealth: Payer: Self-pay | Admitting: General Practice

## 2016-05-17 NOTE — Telephone Encounter (Signed)
Left msg regarding Jan 2018 & Feb 2018 appts.

## 2016-05-25 ENCOUNTER — Ambulatory Visit (HOSPITAL_COMMUNITY)
Admission: RE | Admit: 2016-05-25 | Discharge: 2016-05-25 | Disposition: A | Discharge status: Home / Self Care | Payer: Medicare Other | Source: Ambulatory Visit | Attending: Internal Medicine | Admitting: Internal Medicine

## 2016-05-25 DIAGNOSIS — R911 Solitary pulmonary nodule: Secondary | ICD-10-CM | POA: Insufficient documentation

## 2016-05-25 DIAGNOSIS — C7951 Secondary malignant neoplasm of bone: Secondary | ICD-10-CM | POA: Diagnosis not present

## 2016-05-25 DIAGNOSIS — Z5111 Encounter for antineoplastic chemotherapy: Secondary | ICD-10-CM | POA: Insufficient documentation

## 2016-05-25 DIAGNOSIS — C3491 Malignant neoplasm of unspecified part of right bronchus or lung: Secondary | ICD-10-CM | POA: Diagnosis not present

## 2016-05-25 DIAGNOSIS — C3490 Malignant neoplasm of unspecified part of unspecified bronchus or lung: Secondary | ICD-10-CM | POA: Diagnosis not present

## 2016-05-25 DIAGNOSIS — I712 Thoracic aortic aneurysm, without rupture: Secondary | ICD-10-CM | POA: Diagnosis not present

## 2016-05-25 MED ORDER — IOPAMIDOL (ISOVUE-370) INJECTION 76%
100.0000 mL | Freq: Once | INTRAVENOUS | Status: AC | PRN
  Administered 2016-05-25: 100 mL via INTRAVENOUS

## 2016-05-27 ENCOUNTER — Other Ambulatory Visit (HOSPITAL_BASED_OUTPATIENT_CLINIC_OR_DEPARTMENT_OTHER): Payer: Medicare Other

## 2016-05-27 ENCOUNTER — Ambulatory Visit (HOSPITAL_BASED_OUTPATIENT_CLINIC_OR_DEPARTMENT_OTHER): Payer: Medicare Other | Admitting: Internal Medicine

## 2016-05-27 ENCOUNTER — Telehealth: Payer: Self-pay | Admitting: Internal Medicine

## 2016-05-27 ENCOUNTER — Encounter: Payer: Self-pay | Admitting: Internal Medicine

## 2016-05-27 ENCOUNTER — Ambulatory Visit (HOSPITAL_BASED_OUTPATIENT_CLINIC_OR_DEPARTMENT_OTHER): Payer: Medicare Other

## 2016-05-27 VITALS — BP 160/78 | HR 80 | Temp 98.1°F | Resp 18 | Ht 69.0 in | Wt 162.0 lb

## 2016-05-27 DIAGNOSIS — D6481 Anemia due to antineoplastic chemotherapy: Secondary | ICD-10-CM

## 2016-05-27 DIAGNOSIS — Z5111 Encounter for antineoplastic chemotherapy: Secondary | ICD-10-CM | POA: Diagnosis present

## 2016-05-27 DIAGNOSIS — R530 Neoplastic (malignant) related fatigue: Secondary | ICD-10-CM | POA: Diagnosis not present

## 2016-05-27 DIAGNOSIS — C3491 Malignant neoplasm of unspecified part of right bronchus or lung: Secondary | ICD-10-CM

## 2016-05-27 DIAGNOSIS — C3431 Malignant neoplasm of lower lobe, right bronchus or lung: Secondary | ICD-10-CM

## 2016-05-27 DIAGNOSIS — C7951 Secondary malignant neoplasm of bone: Secondary | ICD-10-CM

## 2016-05-27 LAB — COMPREHENSIVE METABOLIC PANEL
ALT: 46 U/L (ref 0–55)
AST: 44 U/L — ABNORMAL HIGH (ref 5–34)
Albumin: 4.2 g/dL (ref 3.5–5.0)
Alkaline Phosphatase: 68 U/L (ref 40–150)
Anion Gap: 10 mEq/L (ref 3–11)
BUN: 26.6 mg/dL — ABNORMAL HIGH (ref 7.0–26.0)
CO2: 23 mEq/L (ref 22–29)
Calcium: 9.8 mg/dL (ref 8.4–10.4)
Chloride: 107 mEq/L (ref 98–109)
Creatinine: 1.2 mg/dL — ABNORMAL HIGH (ref 0.6–1.1)
EGFR: 49 mL/min/{1.73_m2} — ABNORMAL LOW (ref >90)
Glucose: 108 mg/dl (ref 70–140)
Potassium: 4.2 mEq/L (ref 3.5–5.1)
Sodium: 140 mEq/L (ref 136–145)
Total Bilirubin: 0.58 mg/dL (ref 0.20–1.20)
Total Protein: 7.7 g/dL (ref 6.4–8.3)

## 2016-05-27 LAB — CBC WITH DIFFERENTIAL/PLATELET
BASO%: 0 % (ref 0.0–2.0)
Basophils Absolute: 0 10*3/uL (ref 0.0–0.1)
EOS%: 0 % (ref 0.0–7.0)
Eosinophils Absolute: 0 10*3/uL (ref 0.0–0.5)
HCT: 31.6 % — ABNORMAL LOW (ref 34.8–46.6)
HGB: 10.5 g/dL — ABNORMAL LOW (ref 11.6–15.9)
LYMPH%: 7.3 % — ABNORMAL LOW (ref 14.0–49.7)
MCH: 32.6 pg (ref 25.1–34.0)
MCHC: 33.2 g/dL (ref 31.5–36.0)
MCV: 98.1 fL (ref 79.5–101.0)
MONO#: 0.4 10*3/uL (ref 0.1–0.9)
MONO%: 4.6 % (ref 0.0–14.0)
NEUT#: 7.8 10*3/uL — ABNORMAL HIGH (ref 1.5–6.5)
NEUT%: 88.1 % — ABNORMAL HIGH (ref 38.4–76.8)
Platelets: 251 10*3/uL (ref 145–400)
RBC: 3.22 10*6/uL — ABNORMAL LOW (ref 3.70–5.45)
RDW: 15.9 % — ABNORMAL HIGH (ref 11.2–14.5)
WBC: 8.9 10*3/uL (ref 3.9–10.3)
lymph#: 0.7 10*3/uL — ABNORMAL LOW (ref 0.9–3.3)

## 2016-05-27 MED ORDER — PROCHLORPERAZINE MALEATE 10 MG PO TABS
10.0000 mg | ORAL_TABLET | Freq: Once | ORAL | Status: AC
  Administered 2016-05-27: 10 mg via ORAL

## 2016-05-27 MED ORDER — SODIUM CHLORIDE 0.9 % IV SOLN
500.0000 mg/m2 | Freq: Once | INTRAVENOUS | Status: AC
  Administered 2016-05-27: 950 mg via INTRAVENOUS
  Filled 2016-05-27: qty 38

## 2016-05-27 MED ORDER — SODIUM CHLORIDE 0.9 % IV SOLN
Freq: Once | INTRAVENOUS | Status: AC
  Administered 2016-05-27: 14:00:00 via INTRAVENOUS

## 2016-05-27 MED ORDER — PROCHLORPERAZINE MALEATE 10 MG PO TABS
ORAL_TABLET | ORAL | Status: AC
Start: 2016-05-27 — End: 2016-05-27
  Filled 2016-05-27: qty 1

## 2016-05-27 NOTE — Telephone Encounter (Signed)
Chemo, Labs and follow up appointments with Dr Julien Nordmann was scheduled, every 3 weeks for 07/29/16 and 08/19/16 per 05/27/16 los. A copy of the appointment schedule and AVS report was given to the patient, per 05/27/16 los.

## 2016-05-27 NOTE — Patient Instructions (Signed)
[facility] Discharge Instructions for Patients Receiving Chemotherapy  Today you received the following chemotherapy agents; Alimta.   To help prevent nausea and vomiting after your treatment, we encourage you to take your nausea medication as directed.    If you develop nausea and vomiting that is not controlled by your nausea medication, call the clinic.   BELOW ARE SYMPTOMS THAT SHOULD BE REPORTED IMMEDIATELY:  *FEVER GREATER THAN 100.5 F  *CHILLS WITH OR WITHOUT FEVER  NAUSEA AND VOMITING THAT IS NOT CONTROLLED WITH YOUR NAUSEA MEDICATION  *UNUSUAL SHORTNESS OF BREATH  *UNUSUAL BRUISING OR BLEEDING  TENDERNESS IN MOUTH AND THROAT WITH OR WITHOUT PRESENCE OF ULCERS  *URINARY PROBLEMS  *BOWEL PROBLEMS  UNUSUAL RASH Items with * indicate a potential emergency and should be followed up as soon as possible.  Feel free to call the clinic you have any questions or concerns. The clinic phone number is [phone].  Please show the CHEMO ALERT CARD at check-in to the Emergency Department and triage nurse.   

## 2016-05-27 NOTE — Progress Notes (Signed)
[place] [phone]   Fax:([phone]) [place], MD [place] [address] [place] [address]  DIAGNOSIS: Stage IV (T1b, N2, M1 B) non-small cell lung cancer, adenocarcinoma diagnosed in March 2017 and presented with right lower lobe lung nodule in addition to mediastinal lymphadenopathy and metastatic bone lesions.   Molecular studies: PDL 1 TPS  <1%.  Foundation One Studies: Positive for ERBB2 [id number]. Negative for EGFR, KRAS, ALK, BRAF, MET, RET and ROS1.  PRIOR THERAPY: Induction systemic chemotherapy with carboplatin for AUC of 5 and Alimta 500 MG/M2 every 3 weeks is status post 6 cycles at the [facility], last dose was given 03/05/2016 with stable disease.  CURRENT THERAPY: Maintenance systemic chemotherapy with single agent Alimta 500 MG/M2 every 3 weeks, status post 3 cycles. First dose was given 03/25/2016.  INTERVAL HISTORY: Valerie Long 70 y.o. female returns to the clinic today for follow-up visit accompanied by her sister. The patient is currently undergoing maintenance systemic chemotherapy with single agent Alimta and tolerating her treatment well. She continues to have mild fatigue. She denied having any significant chest pain, shortness of breath, cough or hemoptysis. She has no fever or chills. She has no nausea or vomiting. She denied having any headache or visual changes. She had repeat CT scan of the chest, abdomen and pelvis performed recently and she is here for evaluation and discussion of her scan results and treatment options.  MEDICAL HISTORY: Past Medical History:  Diagnosis Date  . Adenocarcinoma of right lung, stage 4 ([place]) 2017  . Adenocarcinoma of right lung, stage 4 ([place])   . Arthritis   . Encounter for antineoplastic chemotherapy 03/17/2016  . Hypercholesterolemia   . Osteopenia   . 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 Pneumonia   . Shortness of  breath dyspnea     ALLERGIES:  has No Known Allergies.  MEDICATIONS:  Current Outpatient Prescriptions  Medication Sig Dispense Refill  . acetaminophen (TYLENOL) 500 MG tablet Take 1,000 mg by mouth every 6 (six) hours as needed for moderate pain or headache.    . Cyanocobalamin (B-12 PO) Take 2 tablets by mouth daily.    Marland Kitchen dexamethasone (DECADRON) 4 MG tablet Take 4 tablets (16 mg total) by mouth 2 (two) times daily. Take 62m by mouth twice daily the day before, of, and after chemo (Patient taking differently: Take 4 mg by mouth 2 (two) times daily. Take 458mby mouth twice daily the day before, of, and after chemo) 30 tablet 2  . Fish Oil-Cholecalciferol (OMEGA-3 + VITAMIN D3 PO) Take 1 tablet by mouth daily.    . folic acid (FOLVITE) 1 MG tablet Take 1 tablet (1 mg total) by mouth daily. 30 tablet 3  . PRESCRIPTION MEDICATION Inject 1 Dose as directed every 21 ( twenty-one) days. Chemo    . benzonatate (TESSALON) 100 MG capsule Take 100 mg by mouth 3 (three) times daily as needed. cough    . HYDROcodone-homatropine (HYCODAN) 5-1.5 MG/5ML syrup Take 5 mLs by mouth every 6 (six) hours as needed for cough. (Patient not taking: Reported on 05/27/2016) 120 mL 0  . methocarbamol (ROBAXIN) 500 MG tablet Take 1 tablet (500 mg total) by mouth at bedtime and may repeat dose one time if needed. (Patient not taking: Reported on 05/27/2016) 10 tablet 0  . ondansetron (ZOFRAN) 8 MG tablet Take 8 mg by mouth every 8 (  eight) hours as needed.    . prochlorperazine (COMPAZINE) 10 MG tablet Take 10 mg by mouth every 6 (six) hours as needed for nausea.   2   No current facility-administered medications for this visit.     SURGICAL HISTORY:  Past Surgical History:  Procedure Laterality Date  . CESAREAN SECTION     myomectomy  . COLONOSCOPY W/ POLYPECTOMY    . MEDIASTINOSCOPY N/A 09/25/2015   Procedure: MEDIASTINOSCOPY;  Surgeon: Loreli Slot, MD;  Location: [facility] OR;  Service: Thoracic;  Laterality:  N/A;  . VIDEO BRONCHOSCOPY Bilateral 08/19/2015   Procedure: VIDEO BRONCHOSCOPY WITH FLUORO;  Surgeon: Oretha Milch, MD;  Location: [facility] ENDOSCOPY;  Service: Cardiopulmonary;  Laterality: Bilateral;  . VIDEO BRONCHOSCOPY N/A 09/08/2015   Procedure: VIDEO BRONCHOSCOPY WITH FLUORO;  Surgeon: Oretha Milch, MD;  Location: [facility] OR;  Service: Thoracic;  Laterality: N/A;  . VIDEO BRONCHOSCOPY WITH ENDOBRONCHIAL ULTRASOUND Right 09/08/2015   Procedure: ATTEMPTED VIDEO BRONCHOSCOPY ENDOBRONCHIAL ULTRASOUND  ;  Surgeon: Oretha Milch, MD;  Location: [facility] OR;  Service: Thoracic;  Laterality: Right;    REVIEW OF SYSTEMS:  Constitutional: positive for fatigue Eyes: negative Ears, nose, mouth, throat, and face: negative Respiratory: negative Cardiovascular: negative Gastrointestinal: negative Genitourinary:negative Integument/breast: negative Hematologic/lymphatic: negative Musculoskeletal:negative Neurological: negative Behavioral/Psych: negative Endocrine: negative Allergic/Immunologic: negative   PHYSICAL EXAMINATION: General appearance: alert, cooperative, fatigued and no distress Head: Normocephalic, without obvious abnormality, atraumatic Neck: no adenopathy, no JVD, supple, symmetrical, trachea midline and thyroid not enlarged, symmetric, no tenderness/mass/nodules Lymph nodes: Cervical, supraclavicular, and axillary nodes normal. Resp: clear to auscultation bilaterally Back: symmetric, no curvature. ROM normal. No CVA tenderness. Cardio: regular rate and rhythm, S1, S2 normal, no murmur, click, rub or gallop GI: soft, non-tender; bowel sounds normal; no masses,  no organomegaly Extremities: extremities normal, atraumatic, no cyanosis or edema Neurologic: Alert and oriented X 3, normal strength and tone. Normal symmetric reflexes. Normal coordination and gait  ECOG PERFORMANCE STATUS: 1 - Symptomatic but completely ambulatory  Last menstrual period 06/09/1998.  LABORATORY DATA: Lab Results    Component Value Date   WBC 8.9 05/27/2016   HGB 10.5 (L) 05/27/2016   HCT 31.6 (L) 05/27/2016   MCV 98.1 05/27/2016   PLT 251 05/27/2016      Chemistry      Component Value Date/Time   NA 140 05/06/2016 1106   K 3.5 05/06/2016 1106   CL 107 09/25/2015 0805   CO2 22 05/06/2016 1106   BUN 23.4 05/06/2016 1106   CREATININE 1.1 05/06/2016 1106   GLU 170 01/23/2016      Component Value Date/Time   CALCIUM 10.0 05/06/2016 1106   ALKPHOS 62 05/06/2016 1106   AST 44 (H) 05/06/2016 1106   ALT 34 05/06/2016 1106   BILITOT 0.70 05/06/2016 1106       RADIOGRAPHIC STUDIES: Ct Chest W Contrast  Result Date: 05/26/2016 CLINICAL DATA:  Stage IV right lung cancer.  Restaging. EXAM: CT CHEST, ABDOMEN, AND PELVIS WITH CONTRAST TECHNIQUE: Multidetector CT imaging of the chest, abdomen and pelvis was performed following the standard protocol during bolus administration of intravenous contrast. CONTRAST:  100 cc Isovue 370 COMPARISON:  Multiple exams, including PET-CT dated 03/06 08/21/2015 FINDINGS: CT CHEST FINDINGS Cardiovascular: Atherosclerotic calcification of the aortic arch. Mild aneurysmal dilatation of the ascending thoracic aorta at 4.0 cm. No dissection or acute aortic abnormality observed. Mediastinum/Nodes: Subcarinal lymph node was previously hypermetabolic, currently measures 0.8 cm in short axis, previous 0.7 cm. No overtly pathologically  enlarged thoracic adenopathy. Lungs/Pleura: Mild biapical pleuroparenchymal scarring, stable. Previously hypermetabolic right lower lobe mass causes occlusion of an anterior segmental branch of the right lower lobe tracheobronchial tree which in turn causes some partial obscuration of the borders of the nodule due to atelectasis. The AP dimension of the nodule is believed to be a 1.4 cm today and previously 1.7 cm. There is mild atelectasis in the lingula along the left heart border. No pleural effusion. Musculoskeletal: Small sclerotic lesion in the  lateral portion of the clavicle on the right side, image 1/8, only partially included on today' s exam, current size 1.0 by 1.0 cm, previously about 0.3 cm in diameter. Numerous additional small sclerotic bony lesions are present involving the bilateral scapula, ribs, and most thoracic vertebral levels as well as the sternum indicative of small but widespread bony metastatic lesions in the thorax. CT ABDOMEN PELVIS FINDINGS Hepatobiliary: Stable nonspecific 0.5 by 0.9 cm lesion in the lateral segment left hepatic lobe image 51/2, technically nonspecific due to small size and configuration. Multiple gallstones in the gallbladder measuring up to about 6 mm in long axis. Pancreas: Unremarkable Spleen: Unremarkable Adrenals/Urinary Tract: Adrenal glands normal. Pelvic left kidney with non rotation and a somewhat flattened contour of the kidney. Right kidney unremarkable. Stomach/Bowel: Unremarkable Vascular/Lymphatic: Unremarkable Reproductive: Unusual elongated and narrowed appearance of the uterus. Adnexa unremarkable. Other: No supplemental non-categorized findings. Musculoskeletal: As in the thorax, there are innumerable small sclerotic dense lesions scattered throughout the lumbar spine, bony pelvis, and proximal femurs, generally in the 10 mm and less size range, compatible with osseous metastatic lesions. These are significantly increased in conspicuity and number compared to the prior exam. No incipient hip fracture observed. Levoconvex lumbar scoliosis noted with spondylosis and degenerative disc disease resulting in impingement on the right at L2-3 and L3-4, and left-sided impingement at L4-5 and L5-S1. IMPRESSION: 1. Mixed appearance, with reduced size of the right lower lobe pulmonary nodule and associated passive atelectasis, but with innumerable small sclerotic lesions scattered throughout the axial and appendicular skeleton characteristic of osseous metastatic disease. Although it is possible these are  simply more revealed due to treatment effects causing sclerosis, these are certainly greatly increased in size, number, and conspicuity compared to prior. 2. Hypodense lesion laterally in the lateral segment left hepatic lobe remains nonspecific but unchanged. 3. Mild aneurysmal dilatation of the ascending thoracic aorta at 4.0 cm. Recommend annual imaging followup by CTA or MRA. This recommendation follows 2010 ACCF/AHA/AATS/ACR/ASA/SCA/SCAI/SIR/STS/SVM Guidelines for the Diagnosis and Management of Patients with Thoracic Aortic Disease. Circulation. 2010; 121: O242-P536 4. Other imaging findings of potential clinical significance: Atherosclerotic aortic arch. Unusually elongated and narrowed appearance of the uterus. Left pelvic kidney with non rotation and a flattened contour. Lumbar spondylosis and degenerative disc disease causing multilevel impingement. Electronically Signed   By: Van Clines M.D.   On: 05/26/2016 09:21   Ct Abdomen Pelvis W Contrast  Result Date: 05/26/2016 CLINICAL DATA:  Stage IV right lung cancer.  Restaging. EXAM: CT CHEST, ABDOMEN, AND PELVIS WITH CONTRAST TECHNIQUE: Multidetector CT imaging of the chest, abdomen and pelvis was performed following the standard protocol during bolus administration of intravenous contrast. CONTRAST:  100 cc Isovue 370 COMPARISON:  Multiple exams, including PET-CT dated 03/06 08/21/2015 FINDINGS: CT CHEST FINDINGS Cardiovascular: Atherosclerotic calcification of the aortic arch. Mild aneurysmal dilatation of the ascending thoracic aorta at 4.0 cm. No dissection or acute aortic abnormality observed. Mediastinum/Nodes: Subcarinal lymph node was previously hypermetabolic, currently measures 0.8 cm in short  axis, previous 0.7 cm. No overtly pathologically enlarged thoracic adenopathy. Lungs/Pleura: Mild biapical pleuroparenchymal scarring, stable. Previously hypermetabolic right lower lobe mass causes occlusion of an anterior segmental branch of the  right lower lobe tracheobronchial tree which in turn causes some partial obscuration of the borders of the nodule due to atelectasis. The AP dimension of the nodule is believed to be a 1.4 cm today and previously 1.7 cm. There is mild atelectasis in the lingula along the left heart border. No pleural effusion. Musculoskeletal: Small sclerotic lesion in the lateral portion of the clavicle on the right side, image 1/8, only partially included on today' s exam, current size 1.0 by 1.0 cm, previously about 0.3 cm in diameter. Numerous additional small sclerotic bony lesions are present involving the bilateral scapula, ribs, and most thoracic vertebral levels as well as the sternum indicative of small but widespread bony metastatic lesions in the thorax. CT ABDOMEN PELVIS FINDINGS Hepatobiliary: Stable nonspecific 0.5 by 0.9 cm lesion in the lateral segment left hepatic lobe image 51/2, technically nonspecific due to small size and configuration. Multiple gallstones in the gallbladder measuring up to about 6 mm in long axis. Pancreas: Unremarkable Spleen: Unremarkable Adrenals/Urinary Tract: Adrenal glands normal. Pelvic left kidney with non rotation and a somewhat flattened contour of the kidney. Right kidney unremarkable. Stomach/Bowel: Unremarkable Vascular/Lymphatic: Unremarkable Reproductive: Unusual elongated and narrowed appearance of the uterus. Adnexa unremarkable. Other: No supplemental non-categorized findings. Musculoskeletal: As in the thorax, there are innumerable small sclerotic dense lesions scattered throughout the lumbar spine, bony pelvis, and proximal femurs, generally in the 10 mm and less size range, compatible with osseous metastatic lesions. These are significantly increased in conspicuity and number compared to the prior exam. No incipient hip fracture observed. Levoconvex lumbar scoliosis noted with spondylosis and degenerative disc disease resulting in impingement on the right at L2-3 and L3-4,  and left-sided impingement at L4-5 and L5-S1. IMPRESSION: 1. Mixed appearance, with reduced size of the right lower lobe pulmonary nodule and associated passive atelectasis, but with innumerable small sclerotic lesions scattered throughout the axial and appendicular skeleton characteristic of osseous metastatic disease. Although it is possible these are simply more revealed due to treatment effects causing sclerosis, these are certainly greatly increased in size, number, and conspicuity compared to prior. 2. Hypodense lesion laterally in the lateral segment left hepatic lobe remains nonspecific but unchanged. 3. Mild aneurysmal dilatation of the ascending thoracic aorta at 4.0 cm. Recommend annual imaging followup by CTA or MRA. This recommendation follows 2010 ACCF/AHA/AATS/ACR/ASA/SCA/SCAI/SIR/STS/SVM Guidelines for the Diagnosis and Management of Patients with Thoracic Aortic Disease. Circulation. 2010; 121: [id number] 4. Other imaging findings of potential clinical significance: Atherosclerotic aortic arch. Unusually elongated and narrowed appearance of the uterus. Left pelvic kidney with non rotation and a flattened contour. Lumbar spondylosis and degenerative disc disease causing multilevel impingement. Electronically Signed   By: Van Clines M.D.   On: 05/26/2016 09:21    ASSESSMENT AND PLAN: This is a very pleasant 70 years old Hispanic female with: 1) stage IV non-small cell lung cancer status post induction systemic chemotherapy with carboplatin and Alimta and she is currently on maintenance treatment with single agent Alimta status post 3 cycles. The patient history rating her treatment well with no significant adverse effects. She had repeat CT scan of the chest, abdomen and pelvis performed recently. This was compared by the radiologist to her scan at the time of diagnosis in March 2017. I personally and independently reviewed the scan images and compared the  current report to her previous  report from [place]. There is no clear evidence for disease progression based on my comparison. I recommended for the patient to continue her current treatment with maintenance Alimta and she will proceed with cycle #4 today. She would come back for follow-up visit in 3 weeks for evaluation before starting cycle #5. 2) chemotherapy-induced anemia: Her hemoglobin and hematocrit are low but stable. I will continue to monitor her CBC closely. We'll consider the patient for PRBCs transfusion as her hemoglobin is less than 8.0 g/dL. The patient was advised to call immediately if she has any concerning symptoms in the interval. The patient voices understanding of current disease status and treatment options and is in agreement with the current care plan.  All questions were answered. The patient knows to call the clinic with any problems, questions or concerns. We can certainly see the patient much sooner if necessary.  I spent 15 minutes counseling the patient face to face. The total time spent in the appointment was 25 minutes.  Disclaimer: This note was dictated with voice recognition software. Similar sounding words can inadvertently be transcribed and may not be corrected upon review.

## 2016-06-17 ENCOUNTER — Ambulatory Visit (HOSPITAL_BASED_OUTPATIENT_CLINIC_OR_DEPARTMENT_OTHER): Payer: Medicare Other

## 2016-06-17 ENCOUNTER — Ambulatory Visit (HOSPITAL_BASED_OUTPATIENT_CLINIC_OR_DEPARTMENT_OTHER): Payer: Medicare Other | Admitting: Internal Medicine

## 2016-06-17 ENCOUNTER — Other Ambulatory Visit (HOSPITAL_BASED_OUTPATIENT_CLINIC_OR_DEPARTMENT_OTHER): Payer: Medicare Other

## 2016-06-17 VITALS — BP 153/87 | HR 78 | Temp 98.1°F | Resp 16 | Ht 69.0 in | Wt 165.6 lb

## 2016-06-17 DIAGNOSIS — Z5111 Encounter for antineoplastic chemotherapy: Secondary | ICD-10-CM

## 2016-06-17 DIAGNOSIS — C3491 Malignant neoplasm of unspecified part of right bronchus or lung: Secondary | ICD-10-CM

## 2016-06-17 DIAGNOSIS — D6481 Anemia due to antineoplastic chemotherapy: Secondary | ICD-10-CM

## 2016-06-17 DIAGNOSIS — R05 Cough: Secondary | ICD-10-CM

## 2016-06-17 DIAGNOSIS — C3431 Malignant neoplasm of lower lobe, right bronchus or lung: Secondary | ICD-10-CM | POA: Diagnosis not present

## 2016-06-17 DIAGNOSIS — R530 Neoplastic (malignant) related fatigue: Secondary | ICD-10-CM | POA: Diagnosis not present

## 2016-06-17 DIAGNOSIS — C7951 Secondary malignant neoplasm of bone: Secondary | ICD-10-CM

## 2016-06-17 DIAGNOSIS — T451X5A Adverse effect of antineoplastic and immunosuppressive drugs, initial encounter: Secondary | ICD-10-CM

## 2016-06-17 DIAGNOSIS — I10 Essential (primary) hypertension: Secondary | ICD-10-CM

## 2016-06-17 LAB — CBC WITH DIFFERENTIAL/PLATELET
BASO%: 0.1 % (ref 0.0–2.0)
Basophils Absolute: 0 10*3/uL (ref 0.0–0.1)
EOS%: 0 % (ref 0.0–7.0)
Eosinophils Absolute: 0 10*3/uL (ref 0.0–0.5)
HCT: 32.5 % — ABNORMAL LOW (ref 34.8–46.6)
HGB: 10.8 g/dL — ABNORMAL LOW (ref 11.6–15.9)
LYMPH%: 11.3 % — ABNORMAL LOW (ref 14.0–49.7)
MCH: 32.4 pg (ref 25.1–34.0)
MCHC: 33.2 g/dL (ref 31.5–36.0)
MCV: 97.6 fL (ref 79.5–101.0)
MONO#: 0.5 10*3/uL (ref 0.1–0.9)
MONO%: 6.7 % (ref 0.0–14.0)
NEUT#: 6.3 10*3/uL (ref 1.5–6.5)
NEUT%: 81.9 % — ABNORMAL HIGH (ref 38.4–76.8)
Platelets: 246 10*3/uL (ref 145–400)
RBC: 3.33 10*6/uL — ABNORMAL LOW (ref 3.70–5.45)
RDW: 14.5 % (ref 11.2–14.5)
WBC: 7.7 10*3/uL (ref 3.9–10.3)
lymph#: 0.9 10*3/uL (ref 0.9–3.3)

## 2016-06-17 LAB — COMPREHENSIVE METABOLIC PANEL
ALT: 44 U/L (ref 0–55)
AST: 41 U/L — ABNORMAL HIGH (ref 5–34)
Albumin: 3.8 g/dL (ref 3.5–5.0)
Alkaline Phosphatase: 61 U/L (ref 40–150)
Anion Gap: 8 mEq/L (ref 3–11)
BUN: 21.7 mg/dL (ref 7.0–26.0)
CO2: 25 mEq/L (ref 22–29)
Calcium: 9.5 mg/dL (ref 8.4–10.4)
Chloride: 107 mEq/L (ref 98–109)
Creatinine: 1 mg/dL (ref 0.6–1.1)
EGFR: 58 mL/min/{1.73_m2} — ABNORMAL LOW (ref >90)
Glucose: 88 mg/dl (ref 70–140)
Potassium: 3.7 mEq/L (ref 3.5–5.1)
Sodium: 140 mEq/L (ref 136–145)
Total Bilirubin: 0.64 mg/dL (ref 0.20–1.20)
Total Protein: 7.5 g/dL (ref 6.4–8.3)

## 2016-06-17 MED ORDER — PROCHLORPERAZINE MALEATE 10 MG PO TABS
ORAL_TABLET | ORAL | Status: AC
  Filled 2016-06-17: qty 1

## 2016-06-17 MED ORDER — PROCHLORPERAZINE MALEATE 10 MG PO TABS
10.0000 mg | ORAL_TABLET | Freq: Once | ORAL | Status: AC
  Administered 2016-06-17: 10 mg via ORAL

## 2016-06-17 MED ORDER — SODIUM CHLORIDE 0.9 % IV SOLN
Freq: Once | INTRAVENOUS | Status: AC
  Administered 2016-06-17: 14:00:00 via INTRAVENOUS

## 2016-06-17 MED ORDER — SODIUM CHLORIDE 0.9 % IV SOLN
500.0000 mg/m2 | Freq: Once | INTRAVENOUS | Status: AC
  Administered 2016-06-17: 950 mg via INTRAVENOUS
  Filled 2016-06-17: qty 38

## 2016-06-17 NOTE — Progress Notes (Signed)
[facility] [place] [phone]   Fax:([phone]) [place], MD [address] [address]  DIAGNOSIS: Stage IV (T1b, N2, M1b) non-small cell lung cancer, adenocarcinoma diagnosed in March 2017 and presented with right lower lobe lung nodule in addition to mediastinal lymphadenopathy and metastatic bone lesions.  Molecular studies: PDL 1 TPS  <1%.   [facility] Studies: Positive for ERBB2 [id number]. Negative for EGFR, KRAS, ALK, BRAF, MET, RET and ROS1.  PRIOR THERAPY: Induction systemic chemotherapy with carboplatin for AUC of 5 and Alimta 500 MG/M2 every 3 weeks is status post 6 cycles at the [facility], last dose was given 03/05/2016 with stable disease.  CURRENT THERAPY:: Maintenance chemotherapy with Alimta 500 MG/M2 every 3 weeks status post 4 cycles. First dose was given 03/25/2016.   INTERVAL HISTORY: Valerie Long 71 y.o. female returns to the clinic today for follow-up visit accompanied by her sister. The patient is feeling fine today with no specific complaints except for fatigue and shortness of breath with exertion. She has spent rating her treatment with maintenance systemic chemotherapy with single agent Alimta fairly well. She received a communication from [facility] regarding consideration of clinical trial for ERBB2 if she developed any progression to the current treatment. She denied having any nausea, vomiting, diarrhea or constipation. She has no fever or chills. She denied having any cough or hemoptysis. She is here today for evaluation before starting cycle #5.  MEDICAL HISTORY: Past Medical History:  Diagnosis Date  . Adenocarcinoma of right lung, stage 4 ([place] [place]) 2017  . Adenocarcinoma of right lung, stage 4 ([place])   . Arthritis   . Encounter for antineoplastic chemotherapy 03/17/2016  . Hypercholesterolemia   . Osteopenia   . Pelvic kidney    Left. On [place] in  [place]  . Pneumonia   . Shortness of breath dyspnea     ALLERGIES:  has No Known Allergies.  MEDICATIONS:  Current Outpatient Prescriptions  Medication Sig Dispense Refill  . acetaminophen (TYLENOL) 500 MG tablet Take 1,000 mg by mouth every 6 (six) hours as needed for moderate pain or headache.    . benzonatate (TESSALON) 100 MG capsule Take 100 mg by mouth 3 (three) times daily as needed. cough    . Cyanocobalamin (B-12 PO) Take 2 tablets by mouth daily.    Marland Kitchen dexamethasone (DECADRON) 4 MG tablet Take 4 tablets (16 mg total) by mouth 2 (two) times daily. Take 85m by mouth twice daily the day before, of, and after chemo (Patient taking differently: Take 4 mg by mouth 2 (two) times daily. Take 490mby mouth twice daily the day before, of, and after chemo) 30 tablet 2  . Fish Oil-Cholecalciferol (OMEGA-3 + VITAMIN D3 PO) Take 1 tablet by mouth daily.    . folic acid (FOLVITE) 1 MG tablet Take 1 tablet (1 mg total) by mouth daily. 30 tablet 3  . HYDROcodone-homatropine (HYCODAN) 5-1.5 MG/5ML syrup Take 5 mLs by mouth every 6 (six) hours as needed for cough. (Patient not taking: Reported on 05/27/2016) 120 mL 0  . methocarbamol (ROBAXIN) 500 MG tablet Take 1 tablet (500 mg total) by mouth at bedtime and may repeat dose one time if needed. (Patient not taking: Reported on 05/27/2016) 10 tablet 0  . ondansetron (ZOFRAN) 8 MG tablet Take 8 mg by mouth every 8 (eight) hours as needed.    . Marland KitchenRESCRIPTION MEDICATION Inject 1 Dose  as directed every 21 ( twenty-one) days. Chemo    . prochlorperazine (COMPAZINE) 10 MG tablet Take 10 mg by mouth every 6 (six) hours as needed for nausea.   2   No current facility-administered medications for this visit.     SURGICAL HISTORY:  Past Surgical History:  Procedure Laterality Date  . CESAREAN SECTION     myomectomy  . COLONOSCOPY W/ POLYPECTOMY    . MEDIASTINOSCOPY N/A 09/25/2015   Procedure: MEDIASTINOSCOPY;  Surgeon: Melrose Nakayama, MD;   Location: [facility];  Service: Thoracic;  Laterality: N/A;  . VIDEO BRONCHOSCOPY Bilateral 08/19/2015   Procedure: VIDEO BRONCHOSCOPY WITH FLUORO;  Surgeon: Rigoberto Noel, MD;  Location: [facility];  Service: Cardiopulmonary;  Laterality: Bilateral;  . VIDEO BRONCHOSCOPY N/A 09/08/2015   Procedure: VIDEO BRONCHOSCOPY WITH FLUORO;  Surgeon: Rigoberto Noel, MD;  Location: [facility];  Service: Thoracic;  Laterality: N/A;  . VIDEO BRONCHOSCOPY WITH ENDOBRONCHIAL ULTRASOUND Right 09/08/2015   Procedure: ATTEMPTED VIDEO BRONCHOSCOPY ENDOBRONCHIAL ULTRASOUND  ;  Surgeon: Rigoberto Noel, MD;  Location: [facility];  Service: Thoracic;  Laterality: Right;    REVIEW OF SYSTEMS:  Constitutional: positive for fatigue Eyes: negative Ears, nose, mouth, throat, and face: negative Respiratory: positive for dyspnea on exertion Cardiovascular: negative Gastrointestinal: negative Genitourinary:negative Integument/breast: negative Hematologic/lymphatic: negative Musculoskeletal:negative Neurological: negative Behavioral/Psych: negative Endocrine: negative Allergic/Immunologic: negative   PHYSICAL EXAMINATION: General appearance: alert, cooperative, fatigued and no distress Head: Normocephalic, without obvious abnormality, atraumatic Neck: no adenopathy, no JVD, supple, symmetrical, trachea midline and thyroid not enlarged, symmetric, no tenderness/mass/nodules Lymph nodes: Cervical, supraclavicular, and axillary nodes normal. Resp: clear to auscultation bilaterally Back: symmetric, no curvature. ROM normal. No CVA tenderness. Cardio: regular rate and rhythm, S1, S2 normal, no murmur, click, rub or gallop GI: soft, non-tender; bowel sounds normal; no masses,  no organomegaly Extremities: extremities normal, atraumatic, no cyanosis or edema Neurologic: Alert and oriented X 3, normal strength and tone. Normal symmetric reflexes. Normal coordination and gait  ECOG PERFORMANCE STATUS: 1 - Symptomatic but completely  ambulatory  Blood pressure (!) 153/87, pulse 78, temperature 98.1 F (36.7 C), temperature source Oral, resp. rate 16, height '5\' 9"'  (1.753 m), weight 165 lb 9.6 oz (75.1 kg), last menstrual period 06/09/1998, SpO2 100 %.  LABORATORY DATA: Lab Results  Component Value Date   WBC 7.7 06/17/2016   HGB 10.8 (L) 06/17/2016   HCT 32.5 (L) 06/17/2016   MCV 97.6 06/17/2016   PLT 246 06/17/2016      Chemistry      Component Value Date/Time   NA 140 06/17/2016 1139   K 3.7 06/17/2016 1139   CL 107 09/25/2015 0805   CO2 25 06/17/2016 1139   BUN 21.7 06/17/2016 1139   CREATININE 1.0 06/17/2016 1139   GLU 170 01/23/2016      Component Value Date/Time   CALCIUM 9.5 06/17/2016 1139   ALKPHOS 61 06/17/2016 1139   AST 41 (H) 06/17/2016 1139   ALT 44 06/17/2016 1139   BILITOT 0.64 06/17/2016 1139       RADIOGRAPHIC STUDIES: Ct Chest W Contrast  Result Date: 05/26/2016 CLINICAL DATA:  Stage IV right lung cancer.  Restaging. EXAM: CT CHEST, ABDOMEN, AND PELVIS WITH CONTRAST TECHNIQUE: Multidetector CT imaging of the chest, abdomen and pelvis was performed following the standard protocol during bolus administration of intravenous contrast. CONTRAST:  100 cc Isovue 370 COMPARISON:  Multiple exams, including PET-CT dated 03/06 08/21/2015 FINDINGS: CT CHEST FINDINGS Cardiovascular: Atherosclerotic calcification of the aortic  arch. Mild aneurysmal dilatation of the ascending thoracic aorta at 4.0 cm. No dissection or acute aortic abnormality observed. Mediastinum/Nodes: Subcarinal lymph node was previously hypermetabolic, currently measures 0.8 cm in short axis, previous 0.7 cm. No overtly pathologically enlarged thoracic adenopathy. Lungs/Pleura: Mild biapical pleuroparenchymal scarring, stable. Previously hypermetabolic right lower lobe mass causes occlusion of an anterior segmental branch of the right lower lobe tracheobronchial tree which in turn causes some partial obscuration of the borders of  the nodule due to atelectasis. The AP dimension of the nodule is believed to be a 1.4 cm today and previously 1.7 cm. There is mild atelectasis in the lingula along the left heart border. No pleural effusion. Musculoskeletal: Small sclerotic lesion in the lateral portion of the clavicle on the right side, image 1/8, only partially included on today' s exam, current size 1.0 by 1.0 cm, previously about 0.3 cm in diameter. Numerous additional small sclerotic bony lesions are present involving the bilateral scapula, ribs, and most thoracic vertebral levels as well as the sternum indicative of small but widespread bony metastatic lesions in the thorax. CT ABDOMEN PELVIS FINDINGS Hepatobiliary: Stable nonspecific 0.5 by 0.9 cm lesion in the lateral segment left hepatic lobe image 51/2, technically nonspecific due to small size and configuration. Multiple gallstones in the gallbladder measuring up to about 6 mm in long axis. Pancreas: Unremarkable Spleen: Unremarkable Adrenals/Urinary Tract: Adrenal glands normal. Pelvic left kidney with non rotation and a somewhat flattened contour of the kidney. Right kidney unremarkable. Stomach/Bowel: Unremarkable Vascular/Lymphatic: Unremarkable Reproductive: Unusual elongated and narrowed appearance of the uterus. Adnexa unremarkable. Other: No supplemental non-categorized findings. Musculoskeletal: As in the thorax, there are innumerable small sclerotic dense lesions scattered throughout the lumbar spine, bony pelvis, and proximal femurs, generally in the 10 mm and less size range, compatible with osseous metastatic lesions. These are significantly increased in conspicuity and number compared to the prior exam. No incipient hip fracture observed. Levoconvex lumbar scoliosis noted with spondylosis and degenerative disc disease resulting in impingement on the right at L2-3 and L3-4, and left-sided impingement at L4-5 and L5-S1. IMPRESSION: 1. Mixed appearance, with reduced size of  the right lower lobe pulmonary nodule and associated passive atelectasis, but with innumerable small sclerotic lesions scattered throughout the axial and appendicular skeleton characteristic of osseous metastatic disease. Although it is possible these are simply more revealed due to treatment effects causing sclerosis, these are certainly greatly increased in size, number, and conspicuity compared to prior. 2. Hypodense lesion laterally in the lateral segment left hepatic lobe remains nonspecific but unchanged. 3. Mild aneurysmal dilatation of the ascending thoracic aorta at 4.0 cm. Recommend annual imaging followup by CTA or MRA. This recommendation follows 2010 ACCF/AHA/AATS/ACR/ASA/SCA/SCAI/SIR/STS/SVM Guidelines for the Diagnosis and Management of Patients with Thoracic Aortic Disease. Circulation. 2010; 121: O671-I458 4. Other imaging findings of potential clinical significance: Atherosclerotic aortic arch. Unusually elongated and narrowed appearance of the uterus. Left pelvic kidney with non rotation and a flattened contour. Lumbar spondylosis and degenerative disc disease causing multilevel impingement. Electronically Signed   By: Van Clines M.D.   On: 05/26/2016 09:21   Ct Abdomen Pelvis W Contrast  Result Date: 05/26/2016 CLINICAL DATA:  Stage IV right lung cancer.  Restaging. EXAM: CT CHEST, ABDOMEN, AND PELVIS WITH CONTRAST TECHNIQUE: Multidetector CT imaging of the chest, abdomen and pelvis was performed following the standard protocol during bolus administration of intravenous contrast. CONTRAST:  100 cc Isovue 370 COMPARISON:  Multiple exams, including PET-CT dated 03/06 08/21/2015 FINDINGS: CT CHEST  FINDINGS Cardiovascular: Atherosclerotic calcification of the aortic arch. Mild aneurysmal dilatation of the ascending thoracic aorta at 4.0 cm. No dissection or acute aortic abnormality observed. Mediastinum/Nodes: Subcarinal lymph node was previously hypermetabolic, currently measures 0.8 cm  in short axis, previous 0.7 cm. No overtly pathologically enlarged thoracic adenopathy. Lungs/Pleura: Mild biapical pleuroparenchymal scarring, stable. Previously hypermetabolic right lower lobe mass causes occlusion of an anterior segmental branch of the right lower lobe tracheobronchial tree which in turn causes some partial obscuration of the borders of the nodule due to atelectasis. The AP dimension of the nodule is believed to be a 1.4 cm today and previously 1.7 cm. There is mild atelectasis in the lingula along the left heart border. No pleural effusion. Musculoskeletal: Small sclerotic lesion in the lateral portion of the clavicle on the right side, image 1/8, only partially included on today' s exam, current size 1.0 by 1.0 cm, previously about 0.3 cm in diameter. Numerous additional small sclerotic bony lesions are present involving the bilateral scapula, ribs, and most thoracic vertebral levels as well as the sternum indicative of small but widespread bony metastatic lesions in the thorax. CT ABDOMEN PELVIS FINDINGS Hepatobiliary: Stable nonspecific 0.5 by 0.9 cm lesion in the lateral segment left hepatic lobe image 51/2, technically nonspecific due to small size and configuration. Multiple gallstones in the gallbladder measuring up to about 6 mm in long axis. Pancreas: Unremarkable Spleen: Unremarkable Adrenals/Urinary Tract: Adrenal glands normal. Pelvic left kidney with non rotation and a somewhat flattened contour of the kidney. Right kidney unremarkable. Stomach/Bowel: Unremarkable Vascular/Lymphatic: Unremarkable Reproductive: Unusual elongated and narrowed appearance of the uterus. Adnexa unremarkable. Other: No supplemental non-categorized findings. Musculoskeletal: As in the thorax, there are innumerable small sclerotic dense lesions scattered throughout the lumbar spine, bony pelvis, and proximal femurs, generally in the 10 mm and less size range, compatible with osseous metastatic lesions.  These are significantly increased in conspicuity and number compared to the prior exam. No incipient hip fracture observed. Levoconvex lumbar scoliosis noted with spondylosis and degenerative disc disease resulting in impingement on the right at L2-3 and L3-4, and left-sided impingement at L4-5 and L5-S1. IMPRESSION: 1. Mixed appearance, with reduced size of the right lower lobe pulmonary nodule and associated passive atelectasis, but with innumerable small sclerotic lesions scattered throughout the axial and appendicular skeleton characteristic of osseous metastatic disease. Although it is possible these are simply more revealed due to treatment effects causing sclerosis, these are certainly greatly increased in size, number, and conspicuity compared to prior. 2. Hypodense lesion laterally in the lateral segment left hepatic lobe remains nonspecific but unchanged. 3. Mild aneurysmal dilatation of the ascending thoracic aorta at 4.0 cm. Recommend annual imaging followup by CTA or MRA. This recommendation follows 2010 ACCF/AHA/AATS/ACR/ASA/SCA/SCAI/SIR/STS/SVM Guidelines for the Diagnosis and Management of Patients with Thoracic Aortic Disease. Circulation. 2010; 121: F790-W409 4. Other imaging findings of potential clinical significance: Atherosclerotic aortic arch. Unusually elongated and narrowed appearance of the uterus. Left pelvic kidney with non rotation and a flattened contour. Lumbar spondylosis and degenerative disc disease causing multilevel impingement. Electronically Signed   By: Van Clines M.D.   On: 05/26/2016 09:21    ASSESSMENT AND PLAN:  This is a very pleasant 71 years old Hispanic female with: 1) metastatic non-small cell lung cancer, adenocarcinoma status post induction systemic chemotherapy with carboplatin and Alimta. The patient is currently undergoing maintenance treatment with single agent Alimta status post 4 cycles. She is tolerating the treatment well except for fatigue. She  has a  question today about her current condition, prognosis and length of treatment. I answered her question completely to her satisfaction. I recommended for her to proceed with cycle #5 today as scheduled. She would come back for follow-up visit in 3 weeks for evaluation before starting cycle #6. 2) chemotherapy-induced anemia: Her hemoglobin and hematocrit are stable. We will continue to monitor for now and consider the patient for transfusion in the future if needed. 3) hypertension: The patient mentions that her blood pressure is usually normal at home but she usually is very anxious when she comes to the clinic. I strongly recommended for her to monitor her blood pressure closely at home and if it continues to be elevated to discuss with her primary care physician for consideration of treatment. 4) for the dry cough, she will continue on Tessalon if needed. The patient was advised to call immediately if she has any concerning symptoms in the interval. The patient voices understanding of current disease status and treatment options and is in agreement with the current care plan.  All questions were answered. The patient knows to call the clinic with any problems, questions or concerns. We can certainly see the patient much sooner if necessary.  Disclaimer: This note was dictated with voice recognition software. Similar sounding words can inadvertently be transcribed and may not be corrected upon review.

## 2016-06-17 NOTE — Patient Instructions (Signed)
[facility] Discharge Instructions for Patients Receiving Chemotherapy  Today you received the following chemotherapy agents; Alimta.   To help prevent nausea and vomiting after your treatment, we encourage you to take your nausea medication as directed.    If you develop nausea and vomiting that is not controlled by your nausea medication, call the clinic.   BELOW ARE SYMPTOMS THAT SHOULD BE REPORTED IMMEDIATELY:  *FEVER GREATER THAN 100.5 F  *CHILLS WITH OR WITHOUT FEVER  NAUSEA AND VOMITING THAT IS NOT CONTROLLED WITH YOUR NAUSEA MEDICATION  *UNUSUAL SHORTNESS OF BREATH  *UNUSUAL BRUISING OR BLEEDING  TENDERNESS IN MOUTH AND THROAT WITH OR WITHOUT PRESENCE OF ULCERS  *URINARY PROBLEMS  *BOWEL PROBLEMS  UNUSUAL RASH Items with * indicate a potential emergency and should be followed up as soon as possible.  Feel free to call the clinic you have any questions or concerns. The clinic phone number is [phone].  Please show the CHEMO ALERT CARD at check-in to the Emergency Department and triage nurse.   

## 2016-06-18 ENCOUNTER — Encounter: Payer: Self-pay | Admitting: Internal Medicine

## 2016-06-18 DIAGNOSIS — I10 Essential (primary) hypertension: Secondary | ICD-10-CM | POA: Insufficient documentation

## 2016-06-18 DIAGNOSIS — T451X5A Adverse effect of antineoplastic and immunosuppressive drugs, initial encounter: Secondary | ICD-10-CM | POA: Insufficient documentation

## 2016-06-18 DIAGNOSIS — D6481 Anemia due to antineoplastic chemotherapy: Secondary | ICD-10-CM | POA: Insufficient documentation

## 2016-06-18 HISTORY — DX: Essential (primary) hypertension: I10

## 2016-07-08 ENCOUNTER — Other Ambulatory Visit (HOSPITAL_BASED_OUTPATIENT_CLINIC_OR_DEPARTMENT_OTHER): Payer: Medicare Other

## 2016-07-08 ENCOUNTER — Encounter: Payer: Self-pay | Admitting: Internal Medicine

## 2016-07-08 ENCOUNTER — Telehealth: Payer: Self-pay | Admitting: Internal Medicine

## 2016-07-08 ENCOUNTER — Ambulatory Visit (HOSPITAL_BASED_OUTPATIENT_CLINIC_OR_DEPARTMENT_OTHER): Payer: Medicare Other

## 2016-07-08 ENCOUNTER — Ambulatory Visit (HOSPITAL_BASED_OUTPATIENT_CLINIC_OR_DEPARTMENT_OTHER): Payer: Medicare Other | Admitting: Internal Medicine

## 2016-07-08 VITALS — BP 163/79 | HR 83 | Temp 98.0°F | Resp 18 | Ht 69.0 in | Wt 163.6 lb

## 2016-07-08 DIAGNOSIS — Z5111 Encounter for antineoplastic chemotherapy: Secondary | ICD-10-CM | POA: Diagnosis present

## 2016-07-08 DIAGNOSIS — D6481 Anemia due to antineoplastic chemotherapy: Secondary | ICD-10-CM

## 2016-07-08 DIAGNOSIS — C7951 Secondary malignant neoplasm of bone: Secondary | ICD-10-CM | POA: Diagnosis not present

## 2016-07-08 DIAGNOSIS — C3431 Malignant neoplasm of lower lobe, right bronchus or lung: Secondary | ICD-10-CM

## 2016-07-08 DIAGNOSIS — T451X5A Adverse effect of antineoplastic and immunosuppressive drugs, initial encounter: Secondary | ICD-10-CM

## 2016-07-08 DIAGNOSIS — C3491 Malignant neoplasm of unspecified part of right bronchus or lung: Secondary | ICD-10-CM

## 2016-07-08 LAB — CBC WITH DIFFERENTIAL/PLATELET
BASO%: 0 % (ref 0.0–2.0)
Basophils Absolute: 0 10*3/uL (ref 0.0–0.1)
EOS%: 0.1 % (ref 0.0–7.0)
Eosinophils Absolute: 0 10*3/uL (ref 0.0–0.5)
HCT: 31.4 % — ABNORMAL LOW (ref 34.8–46.6)
HGB: 11 g/dL — ABNORMAL LOW (ref 11.6–15.9)
LYMPH%: 11.9 % — ABNORMAL LOW (ref 14.0–49.7)
MCH: 32.4 pg (ref 25.1–34.0)
MCHC: 35 g/dL (ref 31.5–36.0)
MCV: 92.4 fL (ref 79.5–101.0)
MONO#: 0.7 10*3/uL (ref 0.1–0.9)
MONO%: 9.7 % (ref 0.0–14.0)
NEUT#: 6 10*3/uL (ref 1.5–6.5)
NEUT%: 78.3 % — ABNORMAL HIGH (ref 38.4–76.8)
Platelets: 244 10*3/uL (ref 145–400)
RBC: 3.4 10*6/uL — ABNORMAL LOW (ref 3.70–5.45)
RDW: 14.1 % (ref 11.2–14.5)
WBC: 7.6 10*3/uL (ref 3.9–10.3)
lymph#: 0.9 10*3/uL (ref 0.9–3.3)

## 2016-07-08 LAB — COMPREHENSIVE METABOLIC PANEL
ALT: 41 U/L (ref 0–55)
AST: 36 U/L — ABNORMAL HIGH (ref 5–34)
Albumin: 3.9 g/dL (ref 3.5–5.0)
Alkaline Phosphatase: 65 U/L (ref 40–150)
Anion Gap: 10 mEq/L (ref 3–11)
BUN: 21.7 mg/dL (ref 7.0–26.0)
CO2: 24 mEq/L (ref 22–29)
Calcium: 9.8 mg/dL (ref 8.4–10.4)
Chloride: 103 mEq/L (ref 98–109)
Creatinine: 1 mg/dL (ref 0.6–1.1)
EGFR: 55 mL/min/{1.73_m2} — ABNORMAL LOW (ref >90)
Glucose: 94 mg/dl (ref 70–140)
Potassium: 4.1 mEq/L (ref 3.5–5.1)
Sodium: 136 mEq/L (ref 136–145)
Total Bilirubin: 0.52 mg/dL (ref 0.20–1.20)
Total Protein: 7.3 g/dL (ref 6.4–8.3)

## 2016-07-08 MED ORDER — SODIUM CHLORIDE 0.9 % IV SOLN
500.0000 mg/m2 | Freq: Once | INTRAVENOUS | Status: AC
  Administered 2016-07-08: 950 mg via INTRAVENOUS
  Filled 2016-07-08: qty 38

## 2016-07-08 MED ORDER — SODIUM CHLORIDE 0.9 % IV SOLN
Freq: Once | INTRAVENOUS | Status: AC
  Administered 2016-07-08: 13:00:00 via INTRAVENOUS

## 2016-07-08 MED ORDER — CYANOCOBALAMIN 1000 MCG/ML IJ SOLN
INTRAMUSCULAR | Status: AC
  Filled 2016-07-08: qty 1

## 2016-07-08 MED ORDER — PROCHLORPERAZINE MALEATE 10 MG PO TABS
10.0000 mg | ORAL_TABLET | Freq: Once | ORAL | Status: AC
  Administered 2016-07-08: 10 mg via ORAL

## 2016-07-08 MED ORDER — PROCHLORPERAZINE MALEATE 10 MG PO TABS
ORAL_TABLET | ORAL | Status: AC
  Filled 2016-07-08: qty 1

## 2016-07-08 MED ORDER — HEPARIN SOD (PORK) LOCK FLUSH 100 UNIT/ML IV SOLN
500.0000 [IU] | Freq: Once | INTRAVENOUS | Status: DC | PRN
  Filled 2016-07-08: qty 5

## 2016-07-08 MED ORDER — CYANOCOBALAMIN 1000 MCG/ML IJ SOLN
1000.0000 ug | Freq: Once | INTRAMUSCULAR | Status: AC
  Administered 2016-07-08: 1000 ug via INTRAMUSCULAR

## 2016-07-08 MED ORDER — SODIUM CHLORIDE 0.9% FLUSH
10.0000 mL | INTRAVENOUS | Status: DC | PRN
  Filled 2016-07-08: qty 10

## 2016-07-08 NOTE — Progress Notes (Signed)
[place] Telephone:(336) [phone]   Fax:(336) [place], MD [place] [address] [place] [address]  DIAGNOSIS: Stage IV (T1b, N2, M1b) non-small cell lung cancer, adenocarcinoma diagnosed in March 2017 and presented with right lower lobe lung nodule in addition to mediastinal lymphadenopathy and metastatic bone lesions.  Molecular studies: PDL 1 TPS  <1%.   Foundation One Studies: Positive for ERBB2 [id number]. Negative for EGFR, KRAS, ALK, BRAF, MET, RET and ROS1.  PRIOR THERAPY: Induction systemic chemotherapy with carboplatin for AUC of 5 and Alimta 500 MG/M2 every 3 weeks is status post 6 cycles at the [facility], last dose was given 03/05/2016 with stable disease.  CURRENT THERAPY::  1) Maintenance chemotherapy with Alimta 500 MG/M2 every 3 weeks status post 5 cycles. First dose was given 03/25/2016.  2) Zometa 4 mg IV every 12 week for metastatic bone disease.  INTERVAL HISTORY: Valerie Long 71 y.o. female came to the clinic today for follow-up visit accompanied by her sister. The patient is tolerating her treatment with maintenance chemotherapy with Alimta fairly well with no significant adverse effects. She denied having any significant chest pain, shortness of breath, cough or hemoptysis. She has no fever or chills. The patient denied having any significant nausea, vomiting, diarrhea or constipation. She has no weight loss or night sweats. She is here today for evaluation before starting cycle #6. She is interested in dental cleaning and seeing her ophthalmologist for some visual changes.  MEDICAL HISTORY: Past Medical History:  Diagnosis Date  . Adenocarcinoma of right lung, stage 4 ([place]) 2017  . Adenocarcinoma of right lung, stage 4 ([place])   . Arthritis   . Encounter for antineoplastic chemotherapy 03/17/2016  . Hypercholesterolemia   . Hypertension 06/18/2016  . Osteopenia   . 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 Pneumonia   . Shortness of breath dyspnea     ALLERGIES:  has No Known Allergies.  MEDICATIONS:  Current Outpatient Prescriptions  Medication Sig Dispense Refill  . acetaminophen (TYLENOL) 500 MG tablet Take 1,000 mg by mouth every 6 (six) hours as needed for moderate pain or headache.    . benzonatate (TESSALON) 100 MG capsule Take 100 mg by mouth 3 (three) times daily as needed. cough    . Cyanocobalamin (B-12 PO) Take 2 tablets by mouth daily.    Marland Kitchen dexamethasone (DECADRON) 4 MG tablet Take 4 tablets (16 mg total) by mouth 2 (two) times daily. Take 78m by mouth twice daily the day before, of, and after chemo (Patient taking differently: Take 4 mg by mouth 2 (two) times daily. Take 447mby mouth twice daily the day before, of, and after chemo) 30 tablet 2  . Fish Oil-Cholecalciferol (OMEGA-3 + VITAMIN D3 PO) Take 1 tablet by mouth daily.    . folic acid (FOLVITE) 1 MG tablet Take 1 tablet (1 mg total) by mouth daily. 30 tablet 3  . HYDROcodone-homatropine (HYCODAN) 5-1.5 MG/5ML syrup Take 5 mLs by mouth every 6 (six) hours as needed for cough. (Patient not taking: Reported on 05/27/2016) 120 mL 0  . methocarbamol (ROBAXIN) 500 MG tablet Take 1 tablet (500 mg total) by mouth at bedtime and may repeat dose one time if needed. (Patient not taking: Reported on 05/27/2016) 10 tablet 0  . ondansetron (ZOFRAN) 8 MG tablet Take 8 mg by mouth every 8 (eight) hours as needed.    .Marland Kitchen  PRESCRIPTION MEDICATION Inject 1 Dose as directed every 21 ( twenty-one) days. Chemo    . prochlorperazine (COMPAZINE) 10 MG tablet Take 10 mg by mouth every 6 (six) hours as needed for nausea.   2   No current facility-administered medications for this visit.     SURGICAL HISTORY:  Past Surgical History:  Procedure Laterality Date  . CESAREAN SECTION     myomectomy  . COLONOSCOPY W/ POLYPECTOMY    . MEDIASTINOSCOPY N/A 09/25/2015   Procedure: MEDIASTINOSCOPY;  Surgeon: Melrose Nakayama, MD;  Location: [facility];  Service: Thoracic;  Laterality: N/A;  . VIDEO BRONCHOSCOPY Bilateral 08/19/2015   Procedure: VIDEO BRONCHOSCOPY WITH FLUORO;  Surgeon: Rigoberto Noel, MD;  Location: [facility];  Service: Cardiopulmonary;  Laterality: Bilateral;  . VIDEO BRONCHOSCOPY N/A 09/08/2015   Procedure: VIDEO BRONCHOSCOPY WITH FLUORO;  Surgeon: Rigoberto Noel, MD;  Location: [facility];  Service: Thoracic;  Laterality: N/A;  . VIDEO BRONCHOSCOPY WITH ENDOBRONCHIAL ULTRASOUND Right 09/08/2015   Procedure: ATTEMPTED VIDEO BRONCHOSCOPY ENDOBRONCHIAL ULTRASOUND  ;  Surgeon: Rigoberto Noel, MD;  Location: [facility];  Service: Thoracic;  Laterality: Right;    REVIEW OF SYSTEMS:  A comprehensive review of systems was negative except for: Constitutional: positive for fatigue   PHYSICAL EXAMINATION: General appearance: alert, cooperative, fatigued and no distress Head: Normocephalic, without obvious abnormality, atraumatic Neck: no adenopathy, no JVD, supple, symmetrical, trachea midline and thyroid not enlarged, symmetric, no tenderness/mass/nodules Lymph nodes: Cervical, supraclavicular, and axillary nodes normal. Resp: clear to auscultation bilaterally Back: symmetric, no curvature. ROM normal. No CVA tenderness. Cardio: regular rate and rhythm, S1, S2 normal, no murmur, click, rub or gallop GI: soft, non-tender; bowel sounds normal; no masses,  no organomegaly Extremities: extremities normal, atraumatic, no cyanosis or edema  ECOG PERFORMANCE STATUS: 1 - Symptomatic but completely ambulatory  Blood pressure (!) 163/79, pulse 83, temperature 98 F (36.7 C), temperature source Oral, resp. rate 18, height _0  (1.753 m), weight 163 lb 9.6 oz (74.2 kg), last menstrual period 06/09/1998, SpO2 100 %.  LABORATORY DATA: Lab Results  Component Value Date   WBC 7.6 07/08/2016   HGB 11.0 (L) 07/08/2016   HCT 31.4 (L) 07/08/2016   MCV 92.4 07/08/2016   PLT 244 07/08/2016      Chemistry        Component Value Date/Time   NA 140 06/17/2016 1139   K 3.7 06/17/2016 1139   CL 107 09/25/2015 0805   CO2 25 06/17/2016 1139   BUN 21.7 06/17/2016 1139   CREATININE 1.0 06/17/2016 1139   GLU 170 01/23/2016      Component Value Date/Time   CALCIUM 9.5 06/17/2016 1139   ALKPHOS 61 06/17/2016 1139   AST 41 (H) 06/17/2016 1139   ALT 44 06/17/2016 1139   BILITOT 0.64 06/17/2016 1139       RADIOGRAPHIC STUDIES: No results found.  ASSESSMENT AND PLAN:  This is a very pleasant 71 years old Hispanic female with metastatic non-small cell lung cancer, adenocarcinoma status post induction systemic chemotherapy with carboplatin and Alimta. She is currently on maintenance treatment with single agent Alimta status post 5 cycles. She is tolerating the treatment well with no concerning complaints. I recommended for the patient to proceed with cycle #6 today as scheduled. She will come back for follow-up visit in 3 weeks for evaluation after repeating CT scan of the chest, abdomen and pelvis for restaging of her disease. She was advised to call immediately if she has any  concerning symptoms in the interval. The patient voices understanding of current disease status and treatment options and is in agreement with the current care plan.  All questions were answered. The patient knows to call the clinic with any problems, questions or concerns. We can certainly see the patient much sooner if necessary. I spent 10 minutes counseling the patient face to face. The total time spent in the appointment was 15 minutes.  Disclaimer: This note was dictated with voice recognition software. Similar sounding words can inadvertently be transcribed and may not be corrected upon review.

## 2016-07-08 NOTE — Telephone Encounter (Signed)
Patient currently on schedule q3w thru end of March. Central radiology will call re scan. Patient given appointment schedule.

## 2016-07-20 ENCOUNTER — Ambulatory Visit
Admission: RE | Admit: 2016-07-20 | Discharge: 2016-07-20 | Disposition: A | Discharge status: Home / Self Care | Payer: Medicare Other | Source: Ambulatory Visit | Attending: Gynecology | Admitting: Gynecology

## 2016-07-20 ENCOUNTER — Telehealth: Payer: Self-pay | Admitting: Internal Medicine

## 2016-07-20 ENCOUNTER — Other Ambulatory Visit: Payer: Self-pay | Admitting: Gynecology

## 2016-07-20 DIAGNOSIS — Z1231 Encounter for screening mammogram for malignant neoplasm of breast: Secondary | ICD-10-CM

## 2016-07-20 NOTE — Telephone Encounter (Signed)
FAXED RECORDS TO [facility] RELEASE ID [id number]

## 2016-07-22 ENCOUNTER — Other Ambulatory Visit: Payer: Self-pay | Admitting: Gynecology

## 2016-07-22 DIAGNOSIS — R928 Other abnormal and inconclusive findings on diagnostic imaging of breast: Secondary | ICD-10-CM

## 2016-07-23 ENCOUNTER — Other Ambulatory Visit: Payer: Self-pay | Admitting: *Deleted

## 2016-07-23 DIAGNOSIS — C3490 Malignant neoplasm of unspecified part of unspecified bronchus or lung: Secondary | ICD-10-CM

## 2016-07-23 MED ORDER — FOLIC ACID 1 MG PO TABS: 1.0000 mg | ORAL_TABLET | Freq: Every day | ORAL | 3 refills | Status: DC

## 2016-07-27 ENCOUNTER — Encounter (HOSPITAL_COMMUNITY): Payer: Self-pay

## 2016-07-27 ENCOUNTER — Other Ambulatory Visit: Payer: Self-pay | Admitting: Medical Oncology

## 2016-07-27 ENCOUNTER — Ambulatory Visit (HOSPITAL_COMMUNITY)
Admission: RE | Admit: 2016-07-27 | Discharge: 2016-07-27 | Disposition: A | Discharge status: Home / Self Care | Payer: Medicare Other | Source: Ambulatory Visit | Attending: Internal Medicine | Admitting: Internal Medicine

## 2016-07-27 DIAGNOSIS — C3491 Malignant neoplasm of unspecified part of right bronchus or lung: Secondary | ICD-10-CM | POA: Diagnosis not present

## 2016-07-27 DIAGNOSIS — C3490 Malignant neoplasm of unspecified part of unspecified bronchus or lung: Secondary | ICD-10-CM

## 2016-07-27 DIAGNOSIS — D6481 Anemia due to antineoplastic chemotherapy: Secondary | ICD-10-CM | POA: Diagnosis not present

## 2016-07-27 DIAGNOSIS — Z5111 Encounter for antineoplastic chemotherapy: Secondary | ICD-10-CM

## 2016-07-27 DIAGNOSIS — T451X5A Adverse effect of antineoplastic and immunosuppressive drugs, initial encounter: Secondary | ICD-10-CM | POA: Diagnosis not present

## 2016-07-27 DIAGNOSIS — C7951 Secondary malignant neoplasm of bone: Secondary | ICD-10-CM | POA: Diagnosis not present

## 2016-07-27 MED ORDER — FOLIC ACID 1 MG PO TABS: 1.0000 mg | ORAL_TABLET | Freq: Every day | ORAL | 0 refills | Status: DC

## 2016-07-27 MED ORDER — IOPAMIDOL (ISOVUE-300) INJECTION 61%
INTRAVENOUS | Status: AC
  Administered 2016-07-27: 100 mL
  Filled 2016-07-27: qty 100

## 2016-07-27 MED ORDER — SODIUM CHLORIDE 0.9 % IJ SOLN
INTRAMUSCULAR | Status: AC
  Filled 2016-07-27: qty 50

## 2016-07-29 ENCOUNTER — Ambulatory Visit (HOSPITAL_BASED_OUTPATIENT_CLINIC_OR_DEPARTMENT_OTHER): Payer: Medicare Other | Admitting: Internal Medicine

## 2016-07-29 ENCOUNTER — Ambulatory Visit (HOSPITAL_BASED_OUTPATIENT_CLINIC_OR_DEPARTMENT_OTHER): Payer: Medicare Other

## 2016-07-29 ENCOUNTER — Telehealth: Payer: Self-pay | Admitting: Internal Medicine

## 2016-07-29 ENCOUNTER — Other Ambulatory Visit (HOSPITAL_BASED_OUTPATIENT_CLINIC_OR_DEPARTMENT_OTHER): Payer: Medicare Other

## 2016-07-29 ENCOUNTER — Encounter: Payer: Self-pay | Admitting: Internal Medicine

## 2016-07-29 ENCOUNTER — Telehealth: Payer: Self-pay | Admitting: *Deleted

## 2016-07-29 VITALS — BP 155/81 | HR 85 | Temp 98.8°F | Resp 18 | Ht 69.0 in | Wt 163.9 lb

## 2016-07-29 DIAGNOSIS — C3431 Malignant neoplasm of lower lobe, right bronchus or lung: Secondary | ICD-10-CM

## 2016-07-29 DIAGNOSIS — C7951 Secondary malignant neoplasm of bone: Secondary | ICD-10-CM

## 2016-07-29 DIAGNOSIS — Z5111 Encounter for antineoplastic chemotherapy: Secondary | ICD-10-CM

## 2016-07-29 DIAGNOSIS — D6481 Anemia due to antineoplastic chemotherapy: Secondary | ICD-10-CM | POA: Diagnosis not present

## 2016-07-29 DIAGNOSIS — C3491 Malignant neoplasm of unspecified part of right bronchus or lung: Secondary | ICD-10-CM

## 2016-07-29 DIAGNOSIS — I10 Essential (primary) hypertension: Secondary | ICD-10-CM

## 2016-07-29 LAB — CBC WITH DIFFERENTIAL/PLATELET
BASO%: 0.4 % (ref 0.0–2.0)
Basophils Absolute: 0 10*3/uL (ref 0.0–0.1)
EOS%: 0.1 % (ref 0.0–7.0)
Eosinophils Absolute: 0 10*3/uL (ref 0.0–0.5)
HCT: 33.2 % — ABNORMAL LOW (ref 34.8–46.6)
HGB: 11.4 g/dL — ABNORMAL LOW (ref 11.6–15.9)
LYMPH%: 15.8 % (ref 14.0–49.7)
MCH: 33 pg (ref 25.1–34.0)
MCHC: 34.4 g/dL (ref 31.5–36.0)
MCV: 96.1 fL (ref 79.5–101.0)
MONO#: 0.4 10*3/uL (ref 0.1–0.9)
MONO%: 8.2 % (ref 0.0–14.0)
NEUT#: 3.8 10*3/uL (ref 1.5–6.5)
NEUT%: 75.5 % (ref 38.4–76.8)
Platelets: 285 10*3/uL (ref 145–400)
RBC: 3.45 10*6/uL — ABNORMAL LOW (ref 3.70–5.45)
RDW: 14.5 % (ref 11.2–14.5)
WBC: 5 10*3/uL (ref 3.9–10.3)
lymph#: 0.8 10*3/uL — ABNORMAL LOW (ref 0.9–3.3)

## 2016-07-29 LAB — COMPREHENSIVE METABOLIC PANEL
ALT: 29 U/L (ref 0–55)
AST: 36 U/L — ABNORMAL HIGH (ref 5–34)
Albumin: 3.9 g/dL (ref 3.5–5.0)
Alkaline Phosphatase: 74 U/L (ref 40–150)
Anion Gap: 11 mEq/L (ref 3–11)
BUN: 17.2 mg/dL (ref 7.0–26.0)
CO2: 26 mEq/L (ref 22–29)
Calcium: 9.8 mg/dL (ref 8.4–10.4)
Chloride: 100 mEq/L (ref 98–109)
Creatinine: 1.1 mg/dL (ref 0.6–1.1)
EGFR: 50 mL/min/{1.73_m2} — ABNORMAL LOW (ref >90)
Glucose: 114 mg/dl (ref 70–140)
Potassium: 3.8 mEq/L (ref 3.5–5.1)
Sodium: 138 mEq/L (ref 136–145)
Total Bilirubin: 0.63 mg/dL (ref 0.20–1.20)
Total Protein: 7.8 g/dL (ref 6.4–8.3)

## 2016-07-29 MED ORDER — SODIUM CHLORIDE 0.9 % IV SOLN
500.0000 mg/m2 | Freq: Once | INTRAVENOUS | Status: AC
  Administered 2016-07-29: 950 mg via INTRAVENOUS
  Filled 2016-07-29: qty 38

## 2016-07-29 MED ORDER — PROCHLORPERAZINE MALEATE 10 MG PO TABS
ORAL_TABLET | ORAL | Status: AC
  Filled 2016-07-29: qty 1

## 2016-07-29 MED ORDER — ZOLEDRONIC ACID 4 MG/100ML IV SOLN
4.0000 mg | Freq: Once | INTRAVENOUS | Status: AC
  Administered 2016-07-29: 4 mg via INTRAVENOUS
  Filled 2016-07-29: qty 100

## 2016-07-29 MED ORDER — SODIUM CHLORIDE 0.9 % IV SOLN
Freq: Once | INTRAVENOUS | Status: AC
  Administered 2016-07-29: 13:00:00 via INTRAVENOUS

## 2016-07-29 MED ORDER — PROCHLORPERAZINE MALEATE 10 MG PO TABS
10.0000 mg | ORAL_TABLET | Freq: Once | ORAL | Status: AC
  Administered 2016-07-29: 10 mg via ORAL

## 2016-07-29 NOTE — Telephone Encounter (Signed)
Per 2/22 LOS and scheduled I have scheduled appts. Notified thet scheduler

## 2016-07-29 NOTE — Telephone Encounter (Signed)
Appointments scheduled per 2/22 LOS. Patient given ASV report and calendars with future scheduled appointments.

## 2016-07-29 NOTE — Progress Notes (Signed)
[facility] Telephone:([phone]   Fax:(336) [place], MD [place] [address] [place] [address]  DIAGNOSIS: Stage IV (T1b, N2, M1b) non-small cell lung cancer, adenocarcinoma diagnosed in March 2017 and presented with right lower lobe lung nodule in addition to mediastinal lymphadenopathy and metastatic bone lesions.  Molecular studies: PDL 1 TPS  <1%.   Foundation One Studies: Positive for ERBB2 [id number]. Negative for EGFR, KRAS, ALK, BRAF, MET, RET and ROS1.  PRIOR THERAPY: Induction systemic chemotherapy with carboplatin for AUC of 5 and Alimta 500 MG/M2 every 3 weeks is status post 6 cycles at the [facility], last dose was given 03/05/2016 with stable disease.  CURRENT THERAPY::  1) Maintenance chemotherapy with Alimta 500 MG/M2 every 3 weeks status post 6 cycles. First dose was given 03/25/2016.  2) Zometa 4 mg IV every 12 week for metastatic bone disease.  INTERVAL HISTORY: Valerie Long 71 y.o. female came to the clinic today for follow-up visit accompanied by her sister. The patient is feeling very well today was no specific complaints. She is currently on maintenance treatment with Alimta 500 MG/M2 every 3 weeks status post 6 cycles. She is tolerating the treatment well with no significant adverse effects. She denied having any current chest pain, shortness of breath, cough or hemoptysis. She has no fever or chills. She denied having any significant weight loss or night sweats. The patient has no nausea, vomiting, diarrhea or constipation. She had repeat CT scan of the chest, abdomen and pelvis performed recently and she is here for evaluation and discussion of her scan results.  MEDICAL HISTORY: Past Medical History:  Diagnosis Date  . Adenocarcinoma of right lung, stage 4 ([place]) 2017  . Adenocarcinoma of right lung, stage 4 ([place])   . Arthritis   . Encounter for antineoplastic chemotherapy  03/17/2016  . Hypercholesterolemia   . Hypertension 06/18/2016  . Osteopenia   . 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 Pneumonia   . Shortness of breath dyspnea     ALLERGIES:  has No Known Allergies.  MEDICATIONS:  Current Outpatient Prescriptions  Medication Sig Dispense Refill  . acetaminophen (TYLENOL) 500 MG tablet Take 1,000 mg by mouth every 6 (six) hours as needed for moderate pain or headache.    . benzonatate (TESSALON) 100 MG capsule Take 100 mg by mouth 3 (three) times daily as needed. cough    . Cyanocobalamin (B-12 PO) Take 2 tablets by mouth daily.    Marland Kitchen dexamethasone (DECADRON) 4 MG tablet Take 4 tablets (16 mg total) by mouth 2 (two) times daily. Take 70m by mouth twice daily the day before, of, and after chemo (Patient taking differently: Take 4 mg by mouth 2 (two) times daily. Take 467mby mouth twice daily the day before, of, and after chemo) 30 tablet 2  . Fish Oil-Cholecalciferol (OMEGA-3 + VITAMIN D3 PO) Take 1 tablet by mouth daily.    . folic acid (FOLVITE) 1 MG tablet Take 1 tablet (1 mg total) by mouth daily. 90 tablet 0  . HYDROcodone-homatropine (HYCODAN) 5-1.5 MG/5ML syrup Take 5 mLs by mouth every 6 (six) hours as needed for cough. 120 mL 0  . methocarbamol (ROBAXIN) 500 MG tablet Take 1 tablet (500 mg total) by mouth at bedtime and may repeat dose one time if needed. 10 tablet 0  . ondansetron (ZOFRAN) 8 MG tablet Take 8  mg by mouth every 8 (eight) hours as needed.    Marland Kitchen PRESCRIPTION MEDICATION Inject 1 Dose as directed every 21 ( twenty-one) days. Chemo    . prochlorperazine (COMPAZINE) 10 MG tablet Take 10 mg by mouth every 6 (six) hours as needed for nausea.   2   No current facility-administered medications for this visit.     SURGICAL HISTORY:  Past Surgical History:  Procedure Laterality Date  . CESAREAN SECTION     myomectomy  . COLONOSCOPY W/ POLYPECTOMY    . MEDIASTINOSCOPY N/A 09/25/2015   Procedure: MEDIASTINOSCOPY;  Surgeon:  Melrose Nakayama, MD;  Location: [facility];  Service: Thoracic;  Laterality: N/A;  . VIDEO BRONCHOSCOPY Bilateral 08/19/2015   Procedure: VIDEO BRONCHOSCOPY WITH FLUORO;  Surgeon: Rigoberto Noel, MD;  Location: [facility];  Service: Cardiopulmonary;  Laterality: Bilateral;  . VIDEO BRONCHOSCOPY N/A 09/08/2015   Procedure: VIDEO BRONCHOSCOPY WITH FLUORO;  Surgeon: Rigoberto Noel, MD;  Location: [facility];  Service: Thoracic;  Laterality: N/A;  . VIDEO BRONCHOSCOPY WITH ENDOBRONCHIAL ULTRASOUND Right 09/08/2015   Procedure: ATTEMPTED VIDEO BRONCHOSCOPY ENDOBRONCHIAL ULTRASOUND  ;  Surgeon: Rigoberto Noel, MD;  Location: [facility];  Service: Thoracic;  Laterality: Right;    REVIEW OF SYSTEMS:  Constitutional: negative Eyes: negative Ears, nose, mouth, throat, and face: negative Respiratory: negative Cardiovascular: negative Gastrointestinal: negative Genitourinary:negative Integument/breast: negative Hematologic/lymphatic: negative Musculoskeletal:negative Neurological: negative Behavioral/Psych: negative Endocrine: negative Allergic/Immunologic: negative   PHYSICAL EXAMINATION: General appearance: alert, cooperative and no distress Head: Normocephalic, without obvious abnormality, atraumatic Neck: no adenopathy, no JVD, supple, symmetrical, trachea midline and thyroid not enlarged, symmetric, no tenderness/mass/nodules Lymph nodes: Cervical, supraclavicular, and axillary nodes normal. Resp: clear to auscultation bilaterally Back: symmetric, no curvature. ROM normal. No CVA tenderness. Cardio: regular rate and rhythm, S1, S2 normal, no murmur, click, rub or gallop GI: soft, non-tender; bowel sounds normal; no masses,  no organomegaly Extremities: extremities normal, atraumatic, no cyanosis or edema Neurologic: Alert and oriented X 3, normal strength and tone. Normal symmetric reflexes. Normal coordination and gait  ECOG PERFORMANCE STATUS: 1 - Symptomatic but completely ambulatory  Blood pressure  (!) 155/81, pulse 85, temperature 98.8 F (37.1 C), temperature source Oral, resp. rate 18, height '5\' 9"'  (1.753 m), weight 163 lb 14.4 oz (74.3 kg), last menstrual period 06/09/1998, SpO2 100 %.  LABORATORY DATA: Lab Results  Component Value Date   WBC 5.0 07/29/2016   HGB 11.4 (L) 07/29/2016   HCT 33.2 (L) 07/29/2016   MCV 96.1 07/29/2016   PLT 285 07/29/2016      Chemistry      Component Value Date/Time   NA 138 07/29/2016 1058   K 3.8 07/29/2016 1058   CL 107 09/25/2015 0805   CO2 26 07/29/2016 1058   BUN 17.2 07/29/2016 1058   CREATININE 1.1 07/29/2016 1058   GLU 170 01/23/2016      Component Value Date/Time   CALCIUM 9.8 07/29/2016 1058   ALKPHOS 74 07/29/2016 1058   AST 36 (H) 07/29/2016 1058   ALT 29 07/29/2016 1058   BILITOT 0.63 07/29/2016 1058       RADIOGRAPHIC STUDIES: Ct Chest W Contrast  Result Date: 07/27/2016 CLINICAL DATA:  Followup lung cancer. EXAM: CT CHEST, ABDOMEN, AND PELVIS WITH CONTRAST TECHNIQUE: Multidetector CT imaging of the chest, abdomen and pelvis was performed following the standard protocol during bolus administration of intravenous contrast. CONTRAST:  1 ISOVUE-300 IOPAMIDOL (ISOVUE-300) INJECTION 61% COMPARISON:  05/25/2016 FINDINGS: CT CHEST FINDINGS Cardiovascular: The heart  size is mildly enlarged. Similar to previous exam. The trachea appears patent and is midline. Mild aortic atherosclerosis. No pericardial effusion. Mediastinum/Nodes: The trachea appears patent and is midline. Low-density right paratracheal lymph node measures 1.3 cm, image 24 of series 2. Previously 1.1 cm. No hilar adenopathy identified. Lungs/Pleura: There is no pleural fluid. The index lesion within the right lower lobe measures 1.6 x 0.8 cm, image 37 of series 2. Previously 1.7 x 1.0 cm. Adjacent scar like density is similar to previous exam. Small nodule within the right upper lobe measures 3 mm, image 39 of series 4 and appears unchanged from previous exam. Stable  tiny nodule in the superior segment of right lower lobe measuring 3 mm, image 49 of series 4. Musculoskeletal: Multifocal sclerotic bone lesions are identified. When compared with the previous exam the appearance is unchanged CT ABDOMEN PELVIS FINDINGS Hepatobiliary: Low-density structure within the lateral segment of the left lobe measures 8 mm, image 51 of series 2. Unchanged from previous exam. Small stones within the dependent portion of the gallbladder are stable. Pancreas: Unremarkable. No pancreatic ductal dilatation or surrounding inflammatory changes. Spleen: Normal in size without focal abnormality. Adrenals/Urinary Tract: The adrenal glands are normal. Normal appearance of the right kidney. Pelvic kidney is identified within the left iliac fossa. Stomach/Bowel: Stomach is normal. The small bowel loops have a normal course and caliber without evidence for bowel obstruction. No pathologic dilatation of the colon. The appendix is visualized and appears normal. Vascular/Lymphatic: Normal appearance of the abdominal aorta. No enlarged retroperitoneal or mesenteric adenopathy. No enlarged pelvic or inguinal lymph nodes. There is no pelvic or inguinal adenopathy. Reproductive: The uterus and adnexal structures are unremarkable. Other: No free fluid or fluid collections within the abdomen or pelvis. Musculoskeletal: Diffuse sclerotic bone lesions are unchanged compared with previous exam. IMPRESSION: 1. Stable to decrease in size of index lesion within the right lower lobe. 2. There is a low-attenuation right paratracheal lymph node which appears slightly increased in size from previous exam. Nonspecific. Attention on follow-up imaging advise. 3. Diffuse sclerotic bone metastases/treated bone metastases are stable when compared with 05/25/2016. Electronically Signed   By: Kerby Moors M.D.   On: 07/27/2016 17:08   Ct Abdomen Pelvis W Contrast  Result Date: 07/27/2016 CLINICAL DATA:  Followup lung cancer.  EXAM: CT CHEST, ABDOMEN, AND PELVIS WITH CONTRAST TECHNIQUE: Multidetector CT imaging of the chest, abdomen and pelvis was performed following the standard protocol during bolus administration of intravenous contrast. CONTRAST:  1 ISOVUE-300 IOPAMIDOL (ISOVUE-300) INJECTION 61% COMPARISON:  05/25/2016 FINDINGS: CT CHEST FINDINGS Cardiovascular: The heart size is mildly enlarged. Similar to previous exam. The trachea appears patent and is midline. Mild aortic atherosclerosis. No pericardial effusion. Mediastinum/Nodes: The trachea appears patent and is midline. Low-density right paratracheal lymph node measures 1.3 cm, image 24 of series 2. Previously 1.1 cm. No hilar adenopathy identified. Lungs/Pleura: There is no pleural fluid. The index lesion within the right lower lobe measures 1.6 x 0.8 cm, image 37 of series 2. Previously 1.7 x 1.0 cm. Adjacent scar like density is similar to previous exam. Small nodule within the right upper lobe measures 3 mm, image 39 of series 4 and appears unchanged from previous exam. Stable tiny nodule in the superior segment of right lower lobe measuring 3 mm, image 49 of series 4. Musculoskeletal: Multifocal sclerotic bone lesions are identified. When compared with the previous exam the appearance is unchanged CT ABDOMEN PELVIS FINDINGS Hepatobiliary: Low-density structure within the lateral segment of  the left lobe measures 8 mm, image 51 of series 2. Unchanged from previous exam. Small stones within the dependent portion of the gallbladder are stable. Pancreas: Unremarkable. No pancreatic ductal dilatation or surrounding inflammatory changes. Spleen: Normal in size without focal abnormality. Adrenals/Urinary Tract: The adrenal glands are normal. Normal appearance of the right kidney. Pelvic kidney is identified within the left iliac fossa. Stomach/Bowel: Stomach is normal. The small bowel loops have a normal course and caliber without evidence for bowel obstruction. No pathologic  dilatation of the colon. The appendix is visualized and appears normal. Vascular/Lymphatic: Normal appearance of the abdominal aorta. No enlarged retroperitoneal or mesenteric adenopathy. No enlarged pelvic or inguinal lymph nodes. There is no pelvic or inguinal adenopathy. Reproductive: The uterus and adnexal structures are unremarkable. Other: No free fluid or fluid collections within the abdomen or pelvis. Musculoskeletal: Diffuse sclerotic bone lesions are unchanged compared with previous exam. IMPRESSION: 1. Stable to decrease in size of index lesion within the right lower lobe. 2. There is a low-attenuation right paratracheal lymph node which appears slightly increased in size from previous exam. Nonspecific. Attention on follow-up imaging advise. 3. Diffuse sclerotic bone metastases/treated bone metastases are stable when compared with 05/25/2016. Electronically Signed   By: Kerby Moors M.D.   On: 07/27/2016 17:08   Mm Screening Breast Tomo Bilateral  Result Date: 07/21/2016 CLINICAL DATA:  Screening. EXAM: 2D DIGITAL SCREENING BILATERAL MAMMOGRAM WITH CAD AND ADJUNCT TOMO COMPARISON:  Previous exam(s). ACR Breast Density Category b: There are scattered areas of fibroglandular density. FINDINGS: In the right breast, a possible mass warrants further evaluation. This possible mass is seen within the outer right breast, at anterior to middle depth, best seen on tomosynthesis CC slice 31, possible correlate on the MLO view within the upper right breast, slice 41. In the left breast, no findings suspicious for malignancy. Images were processed with CAD. IMPRESSION: Further evaluation is suggested for possible mass in the right breast. RECOMMENDATION: Diagnostic mammogram and possibly ultrasound of the right breast. (Code:[id number]) The patient will be contacted regarding the findings, and additional imaging will be scheduled. BI-RADS CATEGORY  0: Incomplete. Need additional imaging evaluation and/or prior  mammograms for comparison. Electronically Signed   By: Franki [place] M.D.   On: 07/21/2016 13:05    ASSESSMENT AND PLAN:  This is a very pleasant 71 years old Hispanic female with metastatic non-small cell lung cancer, adenocarcinoma status post induction systemic chemotherapy was carboplatin and Alimta. She is currently undergoing treatment with maintenance Alimta status post 6 cycles. She is tolerating the treatment well. She had repeat CT scan of the chest, abdomen and pelvis performed recently. I personally and independently reviewed the scan and discuss result with the patient and her sister. His scan is stable in general with no significant change except for mildly increased right paratracheal lymph node. I recommended for the patient to continue her current treatment with maintenance Alimta as a scheduled and she will receive cycle #7 today. The patient mentions that her sister in [place] insisted on her seeing another physician at [facility] for her third opinion after she was seen here in [place] as well as [facility] in [place]. I explained to the patient that I have no objection to this and I will continue to follow-up routinely as previously scheduled. She will come back for follow-up visit in 3 weeks with the next cycle of her treatment. For hypertension, she was advised to monitor her blood pressure closely at home and to  report to her primary care physician if persisted to be elevated. For the chemotherapy induced anemia, her hemoglobin and hematocrit are stable. We will continue to monitor for now. The patient was advised to call immediately if she has any concerning symptoms in the interval. The patient voices understanding of current disease status and treatment options and is in agreement with the current care plan.  All questions were answered. The patient knows to call the clinic with any problems, questions or concerns. We can certainly see the patient much  sooner if necessary.  Disclaimer: This note was dictated with voice recognition software. Similar sounding words can inadvertently be transcribed and may not be corrected upon review.

## 2016-07-29 NOTE — Patient Instructions (Signed)
[facility] Discharge Instructions for Patients Receiving Chemotherapy  Today you received the following chemotherapy agents alimta/zometa  To help prevent nausea and vomiting after your treatment, we encourage you to take your nausea medication as directed If you develop nausea and vomiting that is not controlled by your nausea medication, call the clinic.   BELOW ARE SYMPTOMS THAT SHOULD BE REPORTED IMMEDIATELY:  *FEVER GREATER THAN 100.5 F  *CHILLS WITH OR WITHOUT FEVER  NAUSEA AND VOMITING THAT IS NOT CONTROLLED WITH YOUR NAUSEA MEDICATION  *UNUSUAL SHORTNESS OF BREATH  *UNUSUAL BRUISING OR BLEEDING  TENDERNESS IN MOUTH AND THROAT WITH OR WITHOUT PRESENCE OF ULCERS  *URINARY PROBLEMS  *BOWEL PROBLEMS  UNUSUAL RASH Items with * indicate a potential emergency and should be followed up as soon as possible.  Feel free to call the clinic you have any questions or concerns. The clinic phone number is [phone].

## 2016-08-06 ENCOUNTER — Ambulatory Visit
Admission: RE | Admit: 2016-08-06 | Discharge: 2016-08-06 | Disposition: A | Discharge status: Home / Self Care | Payer: Medicare Other | Source: Ambulatory Visit | Attending: Gynecology | Admitting: Gynecology

## 2016-08-06 DIAGNOSIS — N6001 Solitary cyst of right breast: Secondary | ICD-10-CM | POA: Diagnosis not present

## 2016-08-06 DIAGNOSIS — R928 Other abnormal and inconclusive findings on diagnostic imaging of breast: Secondary | ICD-10-CM

## 2016-08-09 DIAGNOSIS — C3411 Malignant neoplasm of upper lobe, right bronchus or lung: Secondary | ICD-10-CM | POA: Diagnosis not present

## 2016-08-09 DIAGNOSIS — J320 Chronic maxillary sinusitis: Secondary | ICD-10-CM | POA: Diagnosis not present

## 2016-08-09 DIAGNOSIS — C3490 Malignant neoplasm of unspecified part of unspecified bronchus or lung: Secondary | ICD-10-CM | POA: Diagnosis not present

## 2016-08-09 DIAGNOSIS — E785 Hyperlipidemia, unspecified: Secondary | ICD-10-CM | POA: Diagnosis not present

## 2016-08-09 DIAGNOSIS — J324 Chronic pansinusitis: Secondary | ICD-10-CM | POA: Diagnosis not present

## 2016-08-09 DIAGNOSIS — R7989 Other specified abnormal findings of blood chemistry: Secondary | ICD-10-CM | POA: Diagnosis not present

## 2016-08-10 DIAGNOSIS — C3490 Malignant neoplasm of unspecified part of unspecified bronchus or lung: Secondary | ICD-10-CM | POA: Diagnosis not present

## 2016-08-10 DIAGNOSIS — C771 Secondary and unspecified malignant neoplasm of intrathoracic lymph nodes: Secondary | ICD-10-CM | POA: Diagnosis not present

## 2016-08-10 DIAGNOSIS — E785 Hyperlipidemia, unspecified: Secondary | ICD-10-CM | POA: Diagnosis not present

## 2016-08-10 DIAGNOSIS — R7989 Other specified abnormal findings of blood chemistry: Secondary | ICD-10-CM | POA: Diagnosis not present

## 2016-08-10 DIAGNOSIS — C3411 Malignant neoplasm of upper lobe, right bronchus or lung: Secondary | ICD-10-CM | POA: Diagnosis not present

## 2016-08-11 ENCOUNTER — Other Ambulatory Visit: Payer: Self-pay | Admitting: *Deleted

## 2016-08-11 DIAGNOSIS — C3491 Malignant neoplasm of unspecified part of right bronchus or lung: Secondary | ICD-10-CM

## 2016-08-11 DIAGNOSIS — E785 Hyperlipidemia, unspecified: Secondary | ICD-10-CM | POA: Diagnosis not present

## 2016-08-11 DIAGNOSIS — C3411 Malignant neoplasm of upper lobe, right bronchus or lung: Secondary | ICD-10-CM | POA: Diagnosis not present

## 2016-08-11 DIAGNOSIS — R7989 Other specified abnormal findings of blood chemistry: Secondary | ICD-10-CM | POA: Diagnosis not present

## 2016-08-11 DIAGNOSIS — C7951 Secondary malignant neoplasm of bone: Secondary | ICD-10-CM | POA: Diagnosis not present

## 2016-08-16 ENCOUNTER — Other Ambulatory Visit: Payer: Self-pay | Admitting: Physician Assistant

## 2016-08-17 ENCOUNTER — Other Ambulatory Visit: Payer: Self-pay | Admitting: Internal Medicine

## 2016-08-17 ENCOUNTER — Ambulatory Visit (HOSPITAL_COMMUNITY)
Admission: RE | Admit: 2016-08-17 | Discharge: 2016-08-17 | Disposition: A | Discharge status: Home / Self Care | Payer: Medicare Other | Source: Ambulatory Visit | Attending: Internal Medicine | Admitting: Internal Medicine

## 2016-08-17 ENCOUNTER — Encounter (HOSPITAL_COMMUNITY): Payer: Self-pay

## 2016-08-17 DIAGNOSIS — Z8249 Family history of ischemic heart disease and other diseases of the circulatory system: Secondary | ICD-10-CM | POA: Diagnosis not present

## 2016-08-17 DIAGNOSIS — M8580 Other specified disorders of bone density and structure, unspecified site: Secondary | ICD-10-CM | POA: Insufficient documentation

## 2016-08-17 DIAGNOSIS — Z833 Family history of diabetes mellitus: Secondary | ICD-10-CM | POA: Diagnosis not present

## 2016-08-17 DIAGNOSIS — Z5111 Encounter for antineoplastic chemotherapy: Secondary | ICD-10-CM | POA: Diagnosis not present

## 2016-08-17 DIAGNOSIS — C3491 Malignant neoplasm of unspecified part of right bronchus or lung: Secondary | ICD-10-CM

## 2016-08-17 DIAGNOSIS — E7800 Pure hypercholesterolemia, unspecified: Secondary | ICD-10-CM | POA: Insufficient documentation

## 2016-08-17 DIAGNOSIS — I10 Essential (primary) hypertension: Secondary | ICD-10-CM | POA: Diagnosis not present

## 2016-08-17 DIAGNOSIS — M1990 Unspecified osteoarthritis, unspecified site: Secondary | ICD-10-CM | POA: Insufficient documentation

## 2016-08-17 HISTORY — DX: Anemia, unspecified: D64.9

## 2016-08-17 HISTORY — PX: IR GENERIC HISTORICAL: IMG1180011

## 2016-08-17 LAB — CBC
HCT: 33.1 % — ABNORMAL LOW (ref 36.0–46.0)
Hemoglobin: 11 g/dL — ABNORMAL LOW (ref 12.0–15.0)
MCH: 32.1 pg (ref 26.0–34.0)
MCHC: 33.2 g/dL (ref 30.0–36.0)
MCV: 96.5 fL (ref 78.0–100.0)
Platelets: 228 10*3/uL (ref 150–400)
RBC: 3.43 MIL/uL — ABNORMAL LOW (ref 3.87–5.11)
RDW: 14.5 % (ref 11.5–15.5)
WBC: 5.3 10*3/uL (ref 4.0–10.5)

## 2016-08-17 LAB — PROTIME-INR
INR: 0.95
Prothrombin Time: 12.7 seconds (ref 11.4–15.2)

## 2016-08-17 LAB — APTT: aPTT: 34 seconds (ref 24–36)

## 2016-08-17 MED ORDER — MIDAZOLAM HCL 2 MG/2ML IJ SOLN
INTRAMUSCULAR | Status: AC
  Filled 2016-08-17: qty 6

## 2016-08-17 MED ORDER — CEFAZOLIN SODIUM-DEXTROSE 2-4 GM/100ML-% IV SOLN
2.0000 g | Freq: Once | INTRAVENOUS | Status: AC
  Administered 2016-08-17: 2 g via INTRAVENOUS

## 2016-08-17 MED ORDER — SODIUM CHLORIDE 0.9 % IV SOLN
INTRAVENOUS | Status: DC
  Administered 2016-08-17: 13:00:00 via INTRAVENOUS

## 2016-08-17 MED ORDER — FENTANYL CITRATE (PF) 100 MCG/2ML IJ SOLN
INTRAMUSCULAR | Status: AC | PRN
  Administered 2016-08-17 (×2): 50 ug via INTRAVENOUS

## 2016-08-17 MED ORDER — LIDOCAINE-EPINEPHRINE (PF) 2 %-1:200000 IJ SOLN
INTRAMUSCULAR | Status: AC
  Filled 2016-08-17: qty 20

## 2016-08-17 MED ORDER — CEFAZOLIN SODIUM-DEXTROSE 2-4 GM/100ML-% IV SOLN
INTRAVENOUS | Status: AC
  Administered 2016-08-17: 2 g via INTRAVENOUS
  Filled 2016-08-17: qty 100

## 2016-08-17 MED ORDER — MIDAZOLAM HCL 2 MG/2ML IJ SOLN
INTRAMUSCULAR | Status: AC | PRN
  Administered 2016-08-17: 0.5 mg via INTRAVENOUS
  Administered 2016-08-17: 2 mg via INTRAVENOUS
  Administered 2016-08-17 (×2): 0.5 mg via INTRAVENOUS

## 2016-08-17 MED ORDER — FENTANYL CITRATE (PF) 100 MCG/2ML IJ SOLN
INTRAMUSCULAR | Status: AC
  Filled 2016-08-17: qty 6

## 2016-08-17 MED ORDER — LIDOCAINE-EPINEPHRINE (PF) 2 %-1:200000 IJ SOLN
INTRAMUSCULAR | Status: AC | PRN
  Administered 2016-08-17: 10 mL via INTRADERMAL

## 2016-08-17 MED ORDER — HEPARIN SOD (PORK) LOCK FLUSH 100 UNIT/ML IV SOLN
INTRAVENOUS | Status: AC
  Filled 2016-08-17: qty 5

## 2016-08-17 NOTE — Consult Note (Signed)
Chief Complaint: Patient was seen in consultation today for Port-A-Cath placement  Referring Physician(s): Mohamed,Mohamed  Supervising Physician: [name]  Patient Status: [facility] - Out-pt  History of Present Illness: Valerie Long is a 71 y.o. female with history of stage IV non-small cell lung cancer diagnosed in March 2017.She is currently receiving chemotherapy but has poor venous access and presents today for Port-A-Cath placement for additional treatment.  Past Medical History:  Diagnosis Date  . Adenocarcinoma of right lung, stage 4 ([facility]) 2017  . Adenocarcinoma of right lung, stage 4 ([place])   . Anemia   . Arthritis   . Encounter for antineoplastic chemotherapy 03/17/2016  . Hypercholesterolemia   . Hypertension 06/18/2016  . Osteopenia   . 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 Pneumonia   . Shortness of breath dyspnea     Past Surgical History:  Procedure Laterality Date  . CESAREAN SECTION     myomectomy  . COLONOSCOPY W/ POLYPECTOMY    . MEDIASTINOSCOPY N/A 09/25/2015   Procedure: MEDIASTINOSCOPY;  Surgeon: Melrose Nakayama, MD;  Location: [facility];  Service: Thoracic;  Laterality: N/A;  . VIDEO BRONCHOSCOPY Bilateral 08/19/2015   Procedure: VIDEO BRONCHOSCOPY WITH FLUORO;  Surgeon: Rigoberto Noel, MD;  Location: [facility];  Service: Cardiopulmonary;  Laterality: Bilateral;  . VIDEO BRONCHOSCOPY N/A 09/08/2015   Procedure: VIDEO BRONCHOSCOPY WITH FLUORO;  Surgeon: Rigoberto Noel, MD;  Location: [facility];  Service: Thoracic;  Laterality: N/A;  . VIDEO BRONCHOSCOPY WITH ENDOBRONCHIAL ULTRASOUND Right 09/08/2015   Procedure: ATTEMPTED VIDEO BRONCHOSCOPY ENDOBRONCHIAL ULTRASOUND  ;  Surgeon: Rigoberto Noel, MD;  Location: [facility];  Service: Thoracic;  Laterality: Right;    Allergies: Patient has no known allergies.  Medications: Prior to Admission medications   Medication Sig Start Date End Date Taking? Authorizing Provider  acetaminophen  (TYLENOL) 500 MG tablet Take 1,000 mg by mouth every 6 (six) hours as needed for moderate pain or headache.   Yes Historical Provider, MD  Calcium Carb-Cholecalciferol (CALCIUM 600/VITAMIN D3 PO) Take by mouth.   Yes Historical Provider, MD  Cyanocobalamin (B-12 PO) Take 2 tablets by mouth daily.   Yes Historical Provider, MD  folic acid (FOLVITE) 1 MG tablet Take 1 tablet (1 mg total) by mouth daily. 07/27/16  Yes Curt Bears, MD  benzonatate (TESSALON) 100 MG capsule Take 100 mg by mouth 3 (three) times daily as needed. cough 12/12/15   Historical Provider, MD  dexamethasone (DECADRON) 4 MG tablet Take 4 tablets (16 mg total) by mouth 2 (two) times daily. Take '4mg'$  by mouth twice daily the day before, of, and after chemo Patient taking differently: Take 4 mg by mouth 2 (two) times daily. Take '4mg'$  by mouth twice daily the day before, of, and after chemo 03/23/16   Curt Bears, MD  Fish Oil-Cholecalciferol (OMEGA-3 + VITAMIN D3 PO) Take 1 tablet by mouth daily.    Historical Provider, MD  HYDROcodone-homatropine (HYCODAN) 5-1.5 MG/5ML syrup Take 5 mLs by mouth every 6 (six) hours as needed for cough. 10/01/15   Melrose Nakayama, MD  methocarbamol (ROBAXIN) 500 MG tablet Take 1 tablet (500 mg total) by mouth at bedtime and may repeat dose one time if needed. 02/03/16   Recardo Evangelist, PA-C  ondansetron (ZOFRAN) 8 MG tablet Take 8 mg by mouth every 8 (eight) hours as needed. 11/14/15   Historical Provider, MD  PRESCRIPTION MEDICATION Inject 1 Dose as directed every 21 ( twenty-one)  days. Chemo    Historical Provider, MD  prochlorperazine (COMPAZINE) 10 MG tablet Take 10 mg by mouth every 6 (six) hours as needed for nausea.  11/14/15   Historical Provider, MD     Family History  Problem Relation Age of Onset  . Hypertension Mother     F and M  . CAD Father   . Diabetes Other     auncle-aunts   . Cancer Other     UTERINE   . Breast cancer Sister   . Stroke Neg Hx   . Colon cancer Neg Hx       Social History   Social History  . Marital status: Legally Separated    Spouse name: N/A  . Number of children: 1  . Years of education: N/A   Occupational History  . 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 Smoking status: Never Smoker  . Smokeless tobacco: Never Used  . Alcohol use 0.0 oz/week     Comment: SOCIAL  . Drug use: No  . Sexual activity: No   Other Topics Concern  . None   Social History Narrative   From [place], moved from [place]  to [place] 2008   Lives by herself   Diet- try to eat healthy   Exercise- usually 3-4 / week, walks , some weights      Review of Systems Denies fever, headache, chest pain, dyspnea, abdominal/back pain, nausea, vomiting or bleeding. She does have occasional cough.  Vital Signs: BP (!) 153/78 (BP Location: Right Arm)   Pulse 88   Temp 98.1 F (36.7 C) (Oral)   Resp 16   LMP 06/09/1998   SpO2 100%   Physical Exam Awake, alert. Chest clear to auscultation bilaterally. Heart with regular rate and rhythm. Abdomen soft, positive bowel sounds, nontender. Lower extremities with trace edema bilaterally.  Mallampati Score:     Imaging: Ct Chest W Contrast  Result Date: 07/27/2016 CLINICAL DATA:  Followup lung cancer. EXAM: CT CHEST, ABDOMEN, AND PELVIS WITH CONTRAST TECHNIQUE: Multidetector CT imaging of the chest, abdomen and pelvis was performed following the standard protocol during bolus administration of intravenous contrast. CONTRAST:  1 ISOVUE-300 IOPAMIDOL (ISOVUE-300) INJECTION 61% COMPARISON:  05/25/2016 FINDINGS: CT CHEST FINDINGS Cardiovascular: The heart size is mildly enlarged. Similar to previous exam. The trachea appears patent and is midline. Mild aortic atherosclerosis. No pericardial effusion. Mediastinum/Nodes: The trachea appears patent and is midline. Low-density right paratracheal lymph node measures 1.3 cm, image 24 of series 2. Previously 1.1 cm. No hilar adenopathy identified.  Lungs/Pleura: There is no pleural fluid. The index lesion within the right lower lobe measures 1.6 x 0.8 cm, image 37 of series 2. Previously 1.7 x 1.0 cm. Adjacent scar like density is similar to previous exam. Small nodule within the right upper lobe measures 3 mm, image 39 of series 4 and appears unchanged from previous exam. Stable tiny nodule in the superior segment of right lower lobe measuring 3 mm, image 49 of series 4. Musculoskeletal: Multifocal sclerotic bone lesions are identified. When compared with the previous exam the appearance is unchanged CT ABDOMEN PELVIS FINDINGS Hepatobiliary: Low-density structure within the lateral segment of the left lobe measures 8 mm, image 51 of series 2. Unchanged from previous exam. Small stones within the dependent portion of the gallbladder are stable. Pancreas: Unremarkable. No pancreatic ductal dilatation or surrounding inflammatory changes. Spleen: Normal in size without focal abnormality. Adrenals/Urinary Tract: The adrenal glands are normal.  Normal appearance of the right kidney. Pelvic kidney is identified within the left iliac fossa. Stomach/Bowel: Stomach is normal. The small bowel loops have a normal course and caliber without evidence for bowel obstruction. No pathologic dilatation of the colon. The appendix is visualized and appears normal. Vascular/Lymphatic: Normal appearance of the abdominal aorta. No enlarged retroperitoneal or mesenteric adenopathy. No enlarged pelvic or inguinal lymph nodes. There is no pelvic or inguinal adenopathy. Reproductive: The uterus and adnexal structures are unremarkable. Other: No free fluid or fluid collections within the abdomen or pelvis. Musculoskeletal: Diffuse sclerotic bone lesions are unchanged compared with previous exam. IMPRESSION: 1. Stable to decrease in size of index lesion within the right lower lobe. 2. There is a low-attenuation right paratracheal lymph node which appears slightly increased in size from  previous exam. Nonspecific. Attention on follow-up imaging advise. 3. Diffuse sclerotic bone metastases/treated bone metastases are stable when compared with 05/25/2016. Electronically Signed   By: Kerby Moors M.D.   On: 07/27/2016 17:08   Ct Abdomen Pelvis W Contrast  Result Date: 07/27/2016 CLINICAL DATA:  Followup lung cancer. EXAM: CT CHEST, ABDOMEN, AND PELVIS WITH CONTRAST TECHNIQUE: Multidetector CT imaging of the chest, abdomen and pelvis was performed following the standard protocol during bolus administration of intravenous contrast. CONTRAST:  1 ISOVUE-300 IOPAMIDOL (ISOVUE-300) INJECTION 61% COMPARISON:  05/25/2016 FINDINGS: CT CHEST FINDINGS Cardiovascular: The heart size is mildly enlarged. Similar to previous exam. The trachea appears patent and is midline. Mild aortic atherosclerosis. No pericardial effusion. Mediastinum/Nodes: The trachea appears patent and is midline. Low-density right paratracheal lymph node measures 1.3 cm, image 24 of series 2. Previously 1.1 cm. No hilar adenopathy identified. Lungs/Pleura: There is no pleural fluid. The index lesion within the right lower lobe measures 1.6 x 0.8 cm, image 37 of series 2. Previously 1.7 x 1.0 cm. Adjacent scar like density is similar to previous exam. Small nodule within the right upper lobe measures 3 mm, image 39 of series 4 and appears unchanged from previous exam. Stable tiny nodule in the superior segment of right lower lobe measuring 3 mm, image 49 of series 4. Musculoskeletal: Multifocal sclerotic bone lesions are identified. When compared with the previous exam the appearance is unchanged CT ABDOMEN PELVIS FINDINGS Hepatobiliary: Low-density structure within the lateral segment of the left lobe measures 8 mm, image 51 of series 2. Unchanged from previous exam. Small stones within the dependent portion of the gallbladder are stable. Pancreas: Unremarkable. No pancreatic ductal dilatation or surrounding inflammatory changes.  Spleen: Normal in size without focal abnormality. Adrenals/Urinary Tract: The adrenal glands are normal. Normal appearance of the right kidney. Pelvic kidney is identified within the left iliac fossa. Stomach/Bowel: Stomach is normal. The small bowel loops have a normal course and caliber without evidence for bowel obstruction. No pathologic dilatation of the colon. The appendix is visualized and appears normal. Vascular/Lymphatic: Normal appearance of the abdominal aorta. No enlarged retroperitoneal or mesenteric adenopathy. No enlarged pelvic or inguinal lymph nodes. There is no pelvic or inguinal adenopathy. Reproductive: The uterus and adnexal structures are unremarkable. Other: No free fluid or fluid collections within the abdomen or pelvis. Musculoskeletal: Diffuse sclerotic bone lesions are unchanged compared with previous exam. IMPRESSION: 1. Stable to decrease in size of index lesion within the right lower lobe. 2. There is a low-attenuation right paratracheal lymph node which appears slightly increased in size from previous exam. Nonspecific. Attention on follow-up imaging advise. 3. Diffuse sclerotic bone metastases/treated bone metastases are stable when compared with  05/25/2016. Electronically Signed   By: Kerby Moors M.D.   On: 07/27/2016 17:08   US Breast Ltd Uni Right Inc Axilla  Result Date: 08/06/2016 CLINICAL DATA:  Screening recall for a possible mass in the right breast. EXAM: 2D DIGITAL DIAGNOSTIC RIGHT MAMMOGRAM WITH CAD AND ADJUNCT TOMO ULTRASOUND RIGHT BREAST COMPARISON:  Previous exam(s). ACR Breast Density Category b: There are scattered areas of fibroglandular density. FINDINGS: The possible mass persists in the upper outer right breast there is a circumscribed lobular mass, relatively lucent. There are no other discrete masses and no areas of architectural distortion. Mammographic images were processed with CAD. On physical exam, no mass is palpated in the upper outer right breast.  Targeted ultrasound is performed, showing a septated cyst in the right breast at 10 o'clock, 2 cm the nipple on measuring 9 x 3 x 8 mm, consistent in size, shape and location to the mammographic finding. There are no solid masses or suspicious lesions. IMPRESSION: 1. Benign apocrine cyst in the right breast. 2. No evidence of malignancy. RECOMMENDATION: Screening mammogram in one year.(Code:SM-B-01Y) I have discussed the findings and recommendations with the patient. Results were also provided in writing at the conclusion of the visit. If applicable, a reminder letter will be sent to the patient regarding the next appointment. BI-RADS CATEGORY  2: Benign. Electronically Signed   By: Lajean Manes M.D.   On: 08/06/2016 11:27   Mm Diag Breast Tomo Uni Right  Result Date: 08/06/2016 CLINICAL DATA:  Screening recall for a possible mass in the right breast. EXAM: 2D DIGITAL DIAGNOSTIC RIGHT MAMMOGRAM WITH CAD AND ADJUNCT TOMO ULTRASOUND RIGHT BREAST COMPARISON:  Previous exam(s). ACR Breast Density Category b: There are scattered areas of fibroglandular density. FINDINGS: The possible mass persists in the upper outer right breast there is a circumscribed lobular mass, relatively lucent. There are no other discrete masses and no areas of architectural distortion. Mammographic images were processed with CAD. On physical exam, no mass is palpated in the upper outer right breast. Targeted ultrasound is performed, showing a septated cyst in the right breast at 10 o'clock, 2 cm the nipple on measuring 9 x 3 x 8 mm, consistent in size, shape and location to the mammographic finding. There are no solid masses or suspicious lesions. IMPRESSION: 1. Benign apocrine cyst in the right breast. 2. No evidence of malignancy. RECOMMENDATION: Screening mammogram in one year.(Code:SM-B-01Y) I have discussed the findings and recommendations with the patient. Results were also provided in writing at the conclusion of the visit. If  applicable, a reminder letter will be sent to the patient regarding the next appointment. BI-RADS CATEGORY  2: Benign. Electronically Signed   By: Lajean Manes M.D.   On: 08/06/2016 11:27   Mm Screening Breast Tomo Bilateral  Result Date: 07/21/2016 CLINICAL DATA:  Screening. EXAM: 2D DIGITAL SCREENING BILATERAL MAMMOGRAM WITH CAD AND ADJUNCT TOMO COMPARISON:  Previous exam(s). ACR Breast Density Category b: There are scattered areas of fibroglandular density. FINDINGS: In the right breast, a possible mass warrants further evaluation. This possible mass is seen within the outer right breast, at anterior to middle depth, best seen on tomosynthesis CC slice 31, possible correlate on the MLO view within the upper right breast, slice 41. In the left breast, no findings suspicious for malignancy. Images were processed with CAD. IMPRESSION: Further evaluation is suggested for possible mass in the right breast. RECOMMENDATION: Diagnostic mammogram and possibly ultrasound of the right breast. (Code:[id number]-36M) The patient will be contacted  regarding the findings, and additional imaging will be scheduled. BI-RADS CATEGORY  0: Incomplete. Need additional imaging evaluation and/or prior mammograms for comparison. Electronically Signed   By: Franki Cabot M.D.   On: 07/21/2016 13:05    Labs:  CBC:  Recent Labs  05/27/16 1258 06/17/16 1139 07/08/16 1045 07/29/16 1058  WBC 8.9 7.7 7.6 5.0  HGB 10.5* 10.8* 11.0* 11.4*  HCT 31.6* 32.5* 31.4* 33.2*  PLT 251 246 244 285    COAGS:  Recent Labs  09/25/15 0805  INR 1.02  APTT 29    BMP:  Recent Labs  09/03/15 1200 09/25/15 0805  05/27/16 1258 06/17/16 1139 07/08/16 1046 07/29/16 1058  NA 139 141  < > 140 140 136 138  K 4.2 4.0  < > 4.2 3.7 4.1 3.8  CL 105 107  --   --   --   --   --   CO2 25 24  < > '23 25 24 26  '$ GLUCOSE 90 96  < > 108 88 94 114  BUN 11 16  < > 26.6* 21.7 21.7 17.2  CALCIUM 9.8 9.8  < > 9.8 9.5 9.8 9.8  CREATININE 1.01*  1.06*  < > 1.2* 1.0 1.0 1.1  GFRNONAA 56* 53*  --   --   --   --   --   GFRAA >60 >60  --   --   --   --   --   < > = values in this interval not displayed.  LIVER FUNCTION TESTS:  Recent Labs  05/27/16 1258 06/17/16 1139 07/08/16 1046 07/29/16 1058  BILITOT 0.58 0.64 0.52 0.63  AST 44* 41* 36* 36*  ALT 46 44 41 29  ALKPHOS 68 61 65 74  PROT 7.7 7.5 7.3 7.8  ALBUMIN 4.2 3.8 3.9 3.9    TUMOR MARKERS: No results for input(s): AFPTM, CEA, CA199, CHROMGRNA in the last 8760 hours.  Assessment and Plan:  71 y.o. female with history of stage IV non-small cell lung cancer diagnosed in March 2017.She is currently receiving chemotherapy but has poor venous access and presents today for Port-A-Cath placement for additional treatment.Risks and benefits discussed with the patient/sister including, but not limited to bleeding, infection, pneumothorax, or fibrin sheath development and need for additional procedures. All of the patient's questions were answered, patient is agreeable to proceed. Consent signed and in chart.    Thank you for this interesting consult.  I greatly enjoyed meeting Leinani Lisbon and look forward to participating in their care.  A copy of this report was sent to the requesting provider on this date.  Electronically Signed: D. Rowe Robert 08/17/2016, 1:06 PM   I spent a total of 20 minutes  in face to face in clinical consultation, greater than 50% of which was counseling/coordinating care for Port-A-Cath placement

## 2016-08-17 NOTE — Procedures (Signed)
Interventional Radiology Procedure Note  Procedure: Placement of a right IJ approach single lumen PowerPort.  Tip is positioned at the superior cavoatrial junction and catheter is ready for immediate use.  Complications: No immediate Recommendations:  - Ok to shower tomorrow - Do not submerge for 7 days - Routine line care   Signed,  Heath K. McCullough, MD   

## 2016-08-17 NOTE — Discharge Instructions (Addendum)
Implanted Port Insertion, Care After °This sheet gives you information about how to care for yourself after your procedure. Your health care provider may also give you more specific instructions. If you have problems or questions, contact your health care provider. °What can I expect after the procedure? °After your procedure, it is common to have: °· Discomfort at the port insertion site. °· Bruising on the skin over the port. This should improve over 3-4 days. ° °Follow these instructions at home: °Port care °· After your port is placed, you will get a manufacturer's information card. The card has information about your port. Keep this card with you at all times. °· Take care of the port as told by your health care provider. Ask your health care provider if you or a family member can get training for taking care of the port at home. A home health care nurse may also take care of the port. °· Make sure to remember what type of port you have. °Incision care °· Follow instructions from your health care provider about how to take care of your port insertion site. Make sure you: °? Wash your hands with soap and water before you change your bandage (dressing). If soap and water are not available, use hand sanitizer. °? Change your dressing as told by your health care provider. °? Leave stitches (sutures), skin glue, or adhesive strips in place. These skin closures may need to stay in place for 2 weeks or longer. If adhesive strip edges start to loosen and curl up, you may trim the loose edges. Do not remove adhesive strips completely unless your health care provider tells you to do that. °· Check your port insertion site every day for signs of infection. Check for: °? More redness, swelling, or pain. °? More fluid or blood. °? Warmth. °? Pus or a bad smell. °General instructions °· Do not take baths, swim, or use a hot tub until your health care provider approves. °· Do not lift anything that is heavier than 10 lb (4.5  kg) for a week, or as told by your health care provider. °· Ask your health care provider when it is okay to: °? Return to work or school. °? Resume usual physical activities or sports. °· Do not drive for 24 hours if you were given a medicine to help you relax (sedative). °· Take over-the-counter and prescription medicines only as told by your health care provider. °· Wear a medical alert bracelet in case of an emergency. This will tell any health care providers that you have a port. °· Keep all follow-up visits as told by your health care provider. This is important. °Contact a health care provider if: °· You cannot flush your port with saline as directed, or you cannot draw blood from the port. °· You have a fever or chills. °· You have more redness, swelling, or pain around your port insertion site. °· You have more fluid or blood coming from your port insertion site. °· Your port insertion site feels warm to the touch. °· You have pus or a bad smell coming from the port insertion site. °Get help right away if: °· You have chest pain or shortness of breath. °· You have bleeding from your port that you cannot control. °Summary °· Take care of the port as told by your health care provider. °· Change your dressing as told by your health care provider. °· Keep all follow-up visits as told by your health care provider. °  care provider. This information is not intended to replace advice given to you by your health care provider. Make sure you discuss any questions you have with your health care provider. Document Released: 03/14/2013 Document Revised: 04/14/2016 Document Reviewed: 04/14/2016 Elsevier Interactive Patient Education  2017 [employer].   Moderate Conscious Sedation, Adult, Care After These instructions provide you with information about caring for yourself after your procedure. Your health care provider may also give you more specific instructions. Your treatment has been planned according to current medical  practices, but problems sometimes occur. Call your health care provider if you have any problems or questions after your procedure. What can I expect after the procedure? After your procedure, it is common:  To feel sleepy for several hours.  To feel clumsy and have poor balance for several hours.  To have poor judgment for several hours.  To vomit if you eat too soon. Follow these instructions at home: For at least 24 hours after the procedure:    Do not:  Participate in activities where you could fall or become injured.  Drive.  Use heavy machinery.  Drink alcohol.  Take sleeping pills or medicines that cause drowsiness.  Make important decisions or sign legal documents.  Take care of children on your own.  Rest. Eating and drinking   Follow the diet recommended by your health care provider.  If you vomit:  Drink water, juice, or soup when you can drink without vomiting.  Make sure you have little or no nausea before eating solid foods. General instructions   Have a responsible adult stay with you until you are awake and alert.  Take over-the-counter and prescription medicines only as told by your health care provider.  If you smoke, do not smoke without supervision.  Keep all follow-up visits as told by your health care provider. This is important. Contact a health care provider if:  You keep feeling nauseous or you keep vomiting.  You feel light-headed.  You develop a rash.  You have a fever. Get help right away if:  You have trouble breathing. This information is not intended to replace advice given to you by your health care provider. Make sure you discuss any questions you have with your health care provider. Document Released: 03/14/2013 Document Revised: 10/27/2015 Document Reviewed: 09/13/2015 Elsevier Interactive Patient Education  2017 [employer].

## 2016-08-19 ENCOUNTER — Ambulatory Visit (HOSPITAL_BASED_OUTPATIENT_CLINIC_OR_DEPARTMENT_OTHER): Payer: Medicare Other

## 2016-08-19 ENCOUNTER — Encounter: Payer: Self-pay | Admitting: Internal Medicine

## 2016-08-19 ENCOUNTER — Ambulatory Visit (HOSPITAL_BASED_OUTPATIENT_CLINIC_OR_DEPARTMENT_OTHER): Payer: Medicare Other | Admitting: Internal Medicine

## 2016-08-19 ENCOUNTER — Other Ambulatory Visit (HOSPITAL_BASED_OUTPATIENT_CLINIC_OR_DEPARTMENT_OTHER): Payer: Medicare Other

## 2016-08-19 ENCOUNTER — Other Ambulatory Visit: Payer: Self-pay | Admitting: Medical Oncology

## 2016-08-19 VITALS — BP 143/83 | HR 80 | Temp 98.1°F | Resp 18 | Ht 69.0 in | Wt 163.7 lb

## 2016-08-19 DIAGNOSIS — C3431 Malignant neoplasm of lower lobe, right bronchus or lung: Secondary | ICD-10-CM

## 2016-08-19 DIAGNOSIS — C7951 Secondary malignant neoplasm of bone: Secondary | ICD-10-CM | POA: Diagnosis not present

## 2016-08-19 DIAGNOSIS — C3491 Malignant neoplasm of unspecified part of right bronchus or lung: Secondary | ICD-10-CM

## 2016-08-19 DIAGNOSIS — Z5111 Encounter for antineoplastic chemotherapy: Secondary | ICD-10-CM

## 2016-08-19 DIAGNOSIS — Z95828 Presence of other vascular implants and grafts: Secondary | ICD-10-CM

## 2016-08-19 LAB — CBC WITH DIFFERENTIAL/PLATELET
BASO%: 0.1 % (ref 0.0–2.0)
Basophils Absolute: 0 10*3/uL (ref 0.0–0.1)
EOS%: 0.1 % (ref 0.0–7.0)
Eosinophils Absolute: 0 10*3/uL (ref 0.0–0.5)
HCT: 34.6 % — ABNORMAL LOW (ref 34.8–46.6)
HGB: 11.6 g/dL (ref 11.6–15.9)
LYMPH%: 12 % — ABNORMAL LOW (ref 14.0–49.7)
MCH: 32.2 pg (ref 25.1–34.0)
MCHC: 33.5 g/dL (ref 31.5–36.0)
MCV: 96.1 fL (ref 79.5–101.0)
MONO#: 0.9 10*3/uL (ref 0.1–0.9)
MONO%: 9 % (ref 0.0–14.0)
NEUT#: 8 10*3/uL — ABNORMAL HIGH (ref 1.5–6.5)
NEUT%: 78.8 % — ABNORMAL HIGH (ref 38.4–76.8)
Platelets: 256 10*3/uL (ref 145–400)
RBC: 3.6 10*6/uL — ABNORMAL LOW (ref 3.70–5.45)
RDW: 14.4 % (ref 11.2–14.5)
WBC: 10.1 10*3/uL (ref 3.9–10.3)
lymph#: 1.2 10*3/uL (ref 0.9–3.3)

## 2016-08-19 LAB — COMPREHENSIVE METABOLIC PANEL
ALT: 35 U/L (ref 0–55)
AST: 38 U/L — ABNORMAL HIGH (ref 5–34)
Albumin: 3.9 g/dL (ref 3.5–5.0)
Alkaline Phosphatase: 68 U/L (ref 40–150)
Anion Gap: 10 mEq/L (ref 3–11)
BUN: 20.2 mg/dL (ref 7.0–26.0)
CO2: 25 mEq/L (ref 22–29)
Calcium: 10.3 mg/dL (ref 8.4–10.4)
Chloride: 103 mEq/L (ref 98–109)
Creatinine: 1.1 mg/dL (ref 0.6–1.1)
EGFR: 51 mL/min/{1.73_m2} — ABNORMAL LOW (ref >90)
Glucose: 107 mg/dl (ref 70–140)
Potassium: 4 mEq/L (ref 3.5–5.1)
Sodium: 139 mEq/L (ref 136–145)
Total Bilirubin: 0.72 mg/dL (ref 0.20–1.20)
Total Protein: 7.7 g/dL (ref 6.4–8.3)

## 2016-08-19 MED ORDER — SODIUM CHLORIDE 0.9% FLUSH
10.0000 mL | INTRAVENOUS | Status: DC | PRN
  Administered 2016-08-19: 10 mL
  Filled 2016-08-19: qty 10

## 2016-08-19 MED ORDER — PROCHLORPERAZINE MALEATE 10 MG PO TABS
ORAL_TABLET | ORAL | Status: AC
  Filled 2016-08-19: qty 1

## 2016-08-19 MED ORDER — HEPARIN SOD (PORK) LOCK FLUSH 100 UNIT/ML IV SOLN
500.0000 [IU] | Freq: Once | INTRAVENOUS | Status: AC | PRN
  Administered 2016-08-19: 500 [IU]
  Filled 2016-08-19: qty 5

## 2016-08-19 MED ORDER — SODIUM CHLORIDE 0.9 % IV SOLN
Freq: Once | INTRAVENOUS | Status: AC
  Administered 2016-08-19: 15:00:00 via INTRAVENOUS

## 2016-08-19 MED ORDER — LIDOCAINE-PRILOCAINE 2.5-2.5 % EX CREA: 1.0000 "application " | TOPICAL_CREAM | CUTANEOUS | 0 refills | Status: DC | PRN

## 2016-08-19 MED ORDER — LIDOCAINE-PRILOCAINE 2.5-2.5 % EX CREA
TOPICAL_CREAM | CUTANEOUS | Status: AC
  Filled 2016-08-19: qty 5

## 2016-08-19 MED ORDER — PEMETREXED DISODIUM CHEMO INJECTION 500 MG
500.0000 mg/m2 | Freq: Once | INTRAVENOUS | Status: AC
  Administered 2016-08-19: 950 mg via INTRAVENOUS
  Filled 2016-08-19: qty 38

## 2016-08-19 MED ORDER — PROCHLORPERAZINE MALEATE 10 MG PO TABS
10.0000 mg | ORAL_TABLET | Freq: Once | ORAL | Status: AC
  Administered 2016-08-19: 10 mg via ORAL

## 2016-08-19 MED FILL — LIDOCAINE-PRILOCAINE CREAM: 2.5-2.5 | 10 days supply | Qty: 30 | Fill #0

## 2016-08-19 NOTE — Patient Instructions (Signed)
[facility] Discharge Instructions for Patients Receiving Chemotherapy  Today you received the following chemotherapy agents [place]  To help prevent nausea and vomiting after your treatment, we encourage you to take your nausea medication as directed.   If you develop nausea and vomiting that is not controlled by your nausea medication, call the clinic.   BELOW ARE SYMPTOMS THAT SHOULD BE REPORTED IMMEDIATELY:  *FEVER GREATER THAN 100.5 F  *CHILLS WITH OR WITHOUT FEVER  NAUSEA AND VOMITING THAT IS NOT CONTROLLED WITH YOUR NAUSEA MEDICATION  *UNUSUAL SHORTNESS OF BREATH  *UNUSUAL BRUISING OR BLEEDING  TENDERNESS IN MOUTH AND THROAT WITH OR WITHOUT PRESENCE OF ULCERS  *URINARY PROBLEMS  *BOWEL PROBLEMS  UNUSUAL RASH Items with * indicate a potential emergency and should be followed up as soon as possible.  Feel free to call the clinic you have any questions or concerns. The clinic phone number is [phone].    

## 2016-08-19 NOTE — Progress Notes (Signed)
[place] [phone]   Fax:([phone]) [place], MD [address] [place] [address]  DIAGNOSIS: Stage IV (T1b, N2, M1b) non-small cell lung cancer, adenocarcinoma diagnosed in March 2017 and presented with right lower lobe lung nodule in addition to mediastinal lymphadenopathy and metastatic bone lesions.  Molecular studies: PDL 1 TPS  <1%.   Foundation One Studies: Positive for ERBB2 [id number]. Negative for EGFR, KRAS, ALK, BRAF, MET, RET and ROS1.  PRIOR THERAPY: Induction systemic chemotherapy with carboplatin for AUC of 5 and Alimta 500 MG/M2 every 3 weeks is status post 6 cycles at the [facility], last dose was given 03/05/2016 with stable disease.  CURRENT THERAPY::  1) Maintenance chemotherapy with Alimta 500 MG/M2 every 3 weeks status post 7 cycles. First dose was given 03/25/2016.  2) Zometa 4 mg IV every 12 week for metastatic bone disease.  INTERVAL HISTORY: Valerie Long 71 y.o. female came to the clinic today for follow-up visit accompanied by her sister. The patient is feeling fine today with no specific complaints. She was seen at the [facility] last week for a second opinion because of her sister's recommendation. They agreed with the current plan of her treatment. She denied having any current chest pain, shortness of breath, cough or hemoptysis. She has no fever or chills. She has no nausea or vomiting. She says today for evaluation before starting cycle #8.   MEDICAL HISTORY: Past Medical History:  Diagnosis Date  . Adenocarcinoma of right lung, stage 4 ([place]) 2017  . Adenocarcinoma of right lung, stage 4 ([place])   . Anemia   . Arthritis   . Encounter for antineoplastic chemotherapy 03/17/2016  . Hypercholesterolemia   . Hypertension 06/18/2016  . Osteopenia   . 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 Pneumonia   . Shortness of breath  dyspnea     ALLERGIES:  has No Known Allergies.  MEDICATIONS:  Current Outpatient Prescriptions  Medication Sig Dispense Refill  . acetaminophen (TYLENOL) 500 MG tablet Take 1,000 mg by mouth every 6 (six) hours as needed for moderate pain or headache.    . Calcium Carb-Cholecalciferol (CALCIUM 600/VITAMIN D3 PO) Take by mouth.    . Cyanocobalamin (B-12 PO) Take 2 tablets by mouth daily.    Marland Kitchen dexamethasone (DECADRON) 4 MG tablet Take 4 tablets (16 mg total) by mouth 2 (two) times daily. Take 65m by mouth twice daily the day before, of, and after chemo (Patient taking differently: Take 4 mg by mouth 2 (two) times daily. Take 420mby mouth twice daily the day before, of, and after chemo) 30 tablet 2  . Fish Oil-Cholecalciferol (OMEGA-3 + VITAMIN D3 PO) Take 1 tablet by mouth daily.    . folic acid (FOLVITE) 1 MG tablet Take 1 tablet (1 mg total) by mouth daily. 90 tablet 0  . PRESCRIPTION MEDICATION Inject 1 Dose as directed every 21 ( twenty-one) days. Chemo    . benzonatate (TESSALON) 100 MG capsule Take 100 mg by mouth 3 (three) times daily as needed. cough    . HYDROcodone-homatropine (HYCODAN) 5-1.5 MG/5ML syrup Take 5 mLs by mouth every 6 (six) hours as needed for cough. (Patient not taking: Reported on 08/19/2016) 120 mL 0  . ondansetron (ZOFRAN) 8 MG tablet Take 8 mg by mouth every 8 (eight) hours as needed.    . prochlorperazine (COMPAZINE) 10  MG tablet Take 10 mg by mouth every 6 (six) hours as needed for nausea.   2   No current facility-administered medications for this visit.     SURGICAL HISTORY:  Past Surgical History:  Procedure Laterality Date  . CESAREAN SECTION     myomectomy  . COLONOSCOPY W/ POLYPECTOMY    . IR GENERIC HISTORICAL  08/17/2016   IR US GUIDE VASC ACCESS RIGHT 08/17/2016 [name], MD [facility]  . IR GENERIC HISTORICAL  08/17/2016   IR FLUORO GUIDE PORT INSERTION RIGHT 08/17/2016 [name], MD [facility]  . MEDIASTINOSCOPY N/A 09/25/2015     Procedure: MEDIASTINOSCOPY;  Surgeon: Melrose Nakayama, MD;  Location: [place];  Service: Thoracic;  Laterality: N/A;  . VIDEO BRONCHOSCOPY Bilateral 08/19/2015   Procedure: VIDEO BRONCHOSCOPY WITH FLUORO;  Surgeon: Rigoberto Noel, MD;  Location: [place];  Service: Cardiopulmonary;  Laterality: Bilateral;  . VIDEO BRONCHOSCOPY N/A 09/08/2015   Procedure: VIDEO BRONCHOSCOPY WITH FLUORO;  Surgeon: Rigoberto Noel, MD;  Location: [place];  Service: Thoracic;  Laterality: N/A;  . VIDEO BRONCHOSCOPY WITH ENDOBRONCHIAL ULTRASOUND Right 09/08/2015   Procedure: ATTEMPTED VIDEO BRONCHOSCOPY ENDOBRONCHIAL ULTRASOUND  ;  Surgeon: Rigoberto Noel, MD;  Location: [employer];  Service: Thoracic;  Laterality: Right;    REVIEW OF SYSTEMS:  A comprehensive review of systems was negative.   PHYSICAL EXAMINATION: General appearance: alert, cooperative and no distress Head: Normocephalic, without obvious abnormality, atraumatic Neck: no adenopathy, no JVD, supple, symmetrical, trachea midline and thyroid not enlarged, symmetric, no tenderness/mass/nodules Lymph nodes: Cervical, supraclavicular, and axillary nodes normal. Resp: clear to auscultation bilaterally Back: symmetric, no curvature. ROM normal. No CVA tenderness. Cardio: regular rate and rhythm, S1, S2 normal, no murmur, click, rub or gallop GI: soft, non-tender; bowel sounds normal; no masses,  no organomegaly Extremities: extremities normal, atraumatic, no cyanosis or edema  ECOG PERFORMANCE STATUS: 1 - Symptomatic but completely ambulatory  Blood pressure (!) 143/83, pulse 80, temperature 98.1 F (36.7 C), temperature source Oral, resp. rate 18, height '5\' 9"'  (1.753 m), weight 163 lb 11.2 oz (74.3 kg), last menstrual period 06/09/1998, SpO2 100 %.  LABORATORY DATA: Lab Results  Component Value Date   WBC 10.1 08/19/2016   HGB 11.6 08/19/2016   HCT 34.6 (L) 08/19/2016   MCV 96.1 08/19/2016   PLT 256 08/19/2016      Chemistry      Component Value  Date/Time   NA 139 08/19/2016 1151   K 4.0 08/19/2016 1151   CL 107 09/25/2015 0805   CO2 25 08/19/2016 1151   BUN 20.2 08/19/2016 1151   CREATININE 1.1 08/19/2016 1151   GLU 170 01/23/2016      Component Value Date/Time   CALCIUM 10.3 08/19/2016 1151   ALKPHOS 68 08/19/2016 1151   AST 38 (H) 08/19/2016 1151   ALT 35 08/19/2016 1151   BILITOT 0.72 08/19/2016 1151       RADIOGRAPHIC STUDIES: Ct Chest W Contrast  Result Date: 07/27/2016 CLINICAL DATA:  Followup lung cancer. EXAM: CT CHEST, ABDOMEN, AND PELVIS WITH CONTRAST TECHNIQUE: Multidetector CT imaging of the chest, abdomen and pelvis was performed following the standard protocol during bolus administration of intravenous contrast. CONTRAST:  1 ISOVUE-300 IOPAMIDOL (ISOVUE-300) INJECTION 61% COMPARISON:  05/25/2016 FINDINGS: CT CHEST FINDINGS Cardiovascular: The heart size is mildly enlarged. Similar to previous exam. The trachea appears patent and is midline. Mild aortic atherosclerosis. No pericardial effusion. Mediastinum/Nodes: The trachea appears patent and is midline. Low-density right paratracheal  lymph node measures 1.3 cm, image 24 of series 2. Previously 1.1 cm. No hilar adenopathy identified. Lungs/Pleura: There is no pleural fluid. The index lesion within the right lower lobe measures 1.6 x 0.8 cm, image 37 of series 2. Previously 1.7 x 1.0 cm. Adjacent scar like density is similar to previous exam. Small nodule within the right upper lobe measures 3 mm, image 39 of series 4 and appears unchanged from previous exam. Stable tiny nodule in the superior segment of right lower lobe measuring 3 mm, image 49 of series 4. Musculoskeletal: Multifocal sclerotic bone lesions are identified. When compared with the previous exam the appearance is unchanged CT ABDOMEN PELVIS FINDINGS Hepatobiliary: Low-density structure within the lateral segment of the left lobe measures 8 mm, image 51 of series 2. Unchanged from previous exam. Small stones  within the dependent portion of the gallbladder are stable. Pancreas: Unremarkable. No pancreatic ductal dilatation or surrounding inflammatory changes. Spleen: Normal in size without focal abnormality. Adrenals/Urinary Tract: The adrenal glands are normal. Normal appearance of the right kidney. Pelvic kidney is identified within the left iliac fossa. Stomach/Bowel: Stomach is normal. The small bowel loops have a normal course and caliber without evidence for bowel obstruction. No pathologic dilatation of the colon. The appendix is visualized and appears normal. Vascular/Lymphatic: Normal appearance of the abdominal aorta. No enlarged retroperitoneal or mesenteric adenopathy. No enlarged pelvic or inguinal lymph nodes. There is no pelvic or inguinal adenopathy. Reproductive: The uterus and adnexal structures are unremarkable. Other: No free fluid or fluid collections within the abdomen or pelvis. Musculoskeletal: Diffuse sclerotic bone lesions are unchanged compared with previous exam. IMPRESSION: 1. Stable to decrease in size of index lesion within the right lower lobe. 2. There is a low-attenuation right paratracheal lymph node which appears slightly increased in size from previous exam. Nonspecific. Attention on follow-up imaging advise. 3. Diffuse sclerotic bone metastases/treated bone metastases are stable when compared with 05/25/2016. Electronically Signed   By: Kerby Moors M.D.   On: 07/27/2016 17:08   Ct Abdomen Pelvis W Contrast  Result Date: 07/27/2016 CLINICAL DATA:  Followup lung cancer. EXAM: CT CHEST, ABDOMEN, AND PELVIS WITH CONTRAST TECHNIQUE: Multidetector CT imaging of the chest, abdomen and pelvis was performed following the standard protocol during bolus administration of intravenous contrast. CONTRAST:  1 ISOVUE-300 IOPAMIDOL (ISOVUE-300) INJECTION 61% COMPARISON:  05/25/2016 FINDINGS: CT CHEST FINDINGS Cardiovascular: The heart size is mildly enlarged. Similar to previous exam. The  trachea appears patent and is midline. Mild aortic atherosclerosis. No pericardial effusion. Mediastinum/Nodes: The trachea appears patent and is midline. Low-density right paratracheal lymph node measures 1.3 cm, image 24 of series 2. Previously 1.1 cm. No hilar adenopathy identified. Lungs/Pleura: There is no pleural fluid. The index lesion within the right lower lobe measures 1.6 x 0.8 cm, image 37 of series 2. Previously 1.7 x 1.0 cm. Adjacent scar like density is similar to previous exam. Small nodule within the right upper lobe measures 3 mm, image 39 of series 4 and appears unchanged from previous exam. Stable tiny nodule in the superior segment of right lower lobe measuring 3 mm, image 49 of series 4. Musculoskeletal: Multifocal sclerotic bone lesions are identified. When compared with the previous exam the appearance is unchanged CT ABDOMEN PELVIS FINDINGS Hepatobiliary: Low-density structure within the lateral segment of the left lobe measures 8 mm, image 51 of series 2. Unchanged from previous exam. Small stones within the dependent portion of the gallbladder are stable. Pancreas: Unremarkable. No pancreatic ductal dilatation  or surrounding inflammatory changes. Spleen: Normal in size without focal abnormality. Adrenals/Urinary Tract: The adrenal glands are normal. Normal appearance of the right kidney. Pelvic kidney is identified within the left iliac fossa. Stomach/Bowel: Stomach is normal. The small bowel loops have a normal course and caliber without evidence for bowel obstruction. No pathologic dilatation of the colon. The appendix is visualized and appears normal. Vascular/Lymphatic: Normal appearance of the abdominal aorta. No enlarged retroperitoneal or mesenteric adenopathy. No enlarged pelvic or inguinal lymph nodes. There is no pelvic or inguinal adenopathy. Reproductive: The uterus and adnexal structures are unremarkable. Other: No free fluid or fluid collections within the abdomen or pelvis.  Musculoskeletal: Diffuse sclerotic bone lesions are unchanged compared with previous exam. IMPRESSION: 1. Stable to decrease in size of index lesion within the right lower lobe. 2. There is a low-attenuation right paratracheal lymph node which appears slightly increased in size from previous exam. Nonspecific. Attention on follow-up imaging advise. 3. Diffuse sclerotic bone metastases/treated bone metastases are stable when compared with 05/25/2016. Electronically Signed   By: Kerby Moors M.D.   On: 07/27/2016 17:08   Ir US Guide Vasc Access Right  Result Date: 08/17/2016 INDICATION: 71 year old female with new diagnosis of right lung adenocarcinoma. Durable venous access is required for chemotherapy. EXAM: IMPLANTED PORT A CATH PLACEMENT WITH ULTRASOUND AND FLUOROSCOPIC GUIDANCE MEDICATIONS: 2 g Ancef; The antibiotic was administered within an appropriate time interval prior to skin puncture. ANESTHESIA/SEDATION: Versed 2.5 mg IV; Fentanyl 200 mcg IV; Moderate Sedation Time:  23 minutes The patient was continuously monitored during the procedure by the interventional radiology nurse under my direct supervision. FLUOROSCOPY TIME:  0 minutes, 12 seconds (2 mGy) COMPLICATIONS: None immediate. PROCEDURE: The right neck and chest was prepped with chlorhexidine, and draped in the usual sterile fashion using maximum barrier technique (cap and mask, sterile gown, sterile gloves, large sterile sheet, hand hygiene and cutaneous antiseptic). Antibiotic prophylaxis was provided with 2g Ancef administered IV one hour prior to skin incision. Local anesthesia was attained by infiltration with 1% lidocaine with epinephrine. Ultrasound demonstrated patency of the right internal jugular vein, and this was documented with an image. Under real-time ultrasound guidance, this vein was accessed with a 21 gauge micropuncture needle and image documentation was performed. A small dermatotomy was made at the access site with an 11  scalpel. A 0.018" wire was advanced into the SVC and the access needle exchanged for a 29F micropuncture vascular sheath. The 0.018" wire was then removed and a 0.035" wire advanced into the IVC. An appropriate location for the subcutaneous reservoir was selected below the clavicle and an incision was made through the skin and underlying soft tissues. The subcutaneous tissues were then dissected using a combination of blunt and sharp surgical technique and a pocket was formed. A single lumen power injectable portacatheter was then tunneled through the subcutaneous tissues from the pocket to the dermatotomy and the port reservoir placed within the subcutaneous pocket. The venous access site was then serially dilated and a peel away vascular sheath placed over the wire. The wire was removed and the port catheter advanced into position under fluoroscopic guidance. The catheter tip is positioned in the upper right atrium. This was documented with a spot image. The portacatheter was then tested and found to flush and aspirate well. The port was flushed with saline followed by 100 units/mL heparinized saline. The pocket was then closed in two layers using first subdermal inverted interrupted absorbable sutures followed by a running subcuticular  suture. The epidermis was then sealed with Dermabond. The dermatotomy at the venous access site was also closed with a single inverted subdermal suture and the epidermis sealed with Dermabond. IMPRESSION: Successful placement of a right IJ approach Power Port with ultrasound and fluoroscopic guidance. The catheter is ready for use. Signed, Criselda Peaches, MD [facility]iology Electronically Signed   By: [name] M.D.   On: 08/17/2016 17:08   US Breast Ltd Uni Right Inc Axilla  Result Date: 08/06/2016 CLINICAL DATA:  Screening recall for a possible mass in the right breast. EXAM: 2D DIGITAL DIAGNOSTIC RIGHT  MAMMOGRAM WITH CAD AND ADJUNCT TOMO ULTRASOUND RIGHT BREAST COMPARISON:  Previous exam(s). ACR Breast Density Category b: There are scattered areas of fibroglandular density. FINDINGS: The possible mass persists in the upper outer right breast there is a circumscribed lobular mass, relatively lucent. There are no other discrete masses and no areas of architectural distortion. Mammographic images were processed with CAD. On physical exam, no mass is palpated in the upper outer right breast. Targeted ultrasound is performed, showing a septated cyst in the right breast at 10 o'clock, 2 cm the nipple on measuring 9 x 3 x 8 mm, consistent in size, shape and location to the mammographic finding. There are no solid masses or suspicious lesions. IMPRESSION: 1. Benign apocrine cyst in the right breast. 2. No evidence of malignancy. RECOMMENDATION: Screening mammogram in one year.(Code:[id number]-B-01Y) I have discussed the findings and recommendations with the patient. Results were also provided in writing at the conclusion of the visit. If applicable, a reminder letter will be sent to the patient regarding the next appointment. BI-RADS CATEGORY  2: Benign. Electronically Signed   By: Lajean Manes M.D.   On: 08/06/2016 11:27   Mm Diag Breast Tomo Uni Right  Result Date: 08/06/2016 CLINICAL DATA:  Screening recall for a possible mass in the right breast. EXAM: 2D DIGITAL DIAGNOSTIC RIGHT MAMMOGRAM WITH CAD AND ADJUNCT TOMO ULTRASOUND RIGHT BREAST COMPARISON:  Previous exam(s). ACR Breast Density Category b: There are scattered areas of fibroglandular density. FINDINGS: The possible mass persists in the upper outer right breast there is a circumscribed lobular mass, relatively lucent. There are no other discrete masses and no areas of architectural distortion. Mammographic images were processed with CAD. On physical exam, no mass is palpated in the upper outer right breast. Targeted ultrasound is performed, showing a septated cyst  in the right breast at 10 o'clock, 2 cm the nipple on measuring 9 x 3 x 8 mm, consistent in size, shape and location to the mammographic finding. There are no solid masses or suspicious lesions. IMPRESSION: 1. Benign apocrine cyst in the right breast. 2. No evidence of malignancy. RECOMMENDATION: Screening mammogram in one year.(Code:[id number]-01Y) I have discussed the findings and recommendations with the patient. Results were also provided in writing at the conclusion of the visit. If applicable, a reminder letter will be sent to the patient regarding the next appointment. BI-RADS CATEGORY  2: Benign. Electronically Signed   By: Lajean Manes M.D.   On: 08/06/2016 11:27   Mm Screening Breast Tomo Bilateral  Result Date: 07/21/2016 CLINICAL DATA:  Screening. EXAM: 2D DIGITAL SCREENING BILATERAL MAMMOGRAM WITH CAD AND ADJUNCT TOMO COMPARISON:  Previous exam(s). ACR Breast Density Category b: There are scattered areas of fibroglandular density. FINDINGS: In the right breast, a possible mass warrants further evaluation. This possible mass is seen within the outer right breast, at anterior to middle depth,  best seen on tomosynthesis CC slice 31, possible correlate on the MLO view within the upper right breast, slice 41. In the left breast, no findings suspicious for malignancy. Images were processed with CAD. IMPRESSION: Further evaluation is suggested for possible mass in the right breast. RECOMMENDATION: Diagnostic mammogram and possibly ultrasound of the right breast. (Code:[id number]) The patient will be contacted regarding the findings, and additional imaging will be scheduled. BI-RADS CATEGORY  0: Incomplete. Need additional imaging evaluation and/or prior mammograms for comparison. Electronically Signed   By: Franki Cabot M.D.   On: 07/21/2016 13:05   Ir Fluoro Guide Port Insertion Right  Result Date: 08/17/2016 INDICATION: 71 year old female with new diagnosis of right lung adenocarcinoma. Durable venous  access is required for chemotherapy. EXAM: IMPLANTED PORT A CATH PLACEMENT WITH ULTRASOUND AND FLUOROSCOPIC GUIDANCE MEDICATIONS: 2 g Ancef; The antibiotic was administered within an appropriate time interval prior to skin puncture. ANESTHESIA/SEDATION: Versed 2.5 mg IV; Fentanyl 200 mcg IV; Moderate Sedation Time:  23 minutes The patient was continuously monitored during the procedure by the interventional radiology nurse under my direct supervision. FLUOROSCOPY TIME:  0 minutes, 12 seconds (2 mGy) COMPLICATIONS: None immediate. PROCEDURE: The right neck and chest was prepped with chlorhexidine, and draped in the usual sterile fashion using maximum barrier technique (cap and mask, sterile gown, sterile gloves, large sterile sheet, hand hygiene and cutaneous antiseptic). Antibiotic prophylaxis was provided with 2g Ancef administered IV one hour prior to skin incision. Local anesthesia was attained by infiltration with 1% lidocaine with epinephrine. Ultrasound demonstrated patency of the right internal jugular vein, and this was documented with an image. Under real-time ultrasound guidance, this vein was accessed with a 21 gauge micropuncture needle and image documentation was performed. A small dermatotomy was made at the access site with an 11 scalpel. A 0.018" wire was advanced into the SVC and the access needle exchanged for a 1F micropuncture vascular sheath. The 0.018" wire was then removed and a 0.035" wire advanced into the IVC. An appropriate location for the subcutaneous reservoir was selected below the clavicle and an incision was made through the skin and underlying soft tissues. The subcutaneous tissues were then dissected using a combination of blunt and sharp surgical technique and a pocket was formed. A single lumen power injectable portacatheter was then tunneled through the subcutaneous tissues from the pocket to the dermatotomy and the port reservoir placed within the subcutaneous pocket. The  venous access site was then serially dilated and a peel away vascular sheath placed over the wire. The wire was removed and the port catheter advanced into position under fluoroscopic guidance. The catheter tip is positioned in the upper right atrium. This was documented with a spot image. The portacatheter was then tested and found to flush and aspirate well. The port was flushed with saline followed by 100 units/mL heparinized saline. The pocket was then closed in two layers using first subdermal inverted interrupted absorbable sutures followed by a running subcuticular suture. The epidermis was then sealed with Dermabond. The dermatotomy at the venous access site was also closed with a single inverted subdermal suture and the epidermis sealed with Dermabond. IMPRESSION: Successful placement of a right IJ approach Power Port with ultrasound and fluoroscopic guidance. The catheter is ready for use. Signed, Criselda Peaches, MD [name] Radiology Electronically Signed   By: [name] M.D.   On: 08/17/2016 17:08    ASSESSMENT AND PLAN:  This is a very pleasant 71  years old Hispanic female with metastatic non-small cell lung cancer, adenocarcinoma status post systemic chemotherapy with carboplatin and Alimta and she is currently on maintenance treatment with single agent Alimta status post 7 cycles. The patient is tolerating her treatment well with no significant adverse effects. I recommended for her to proceed with cycle #8 today as scheduled. She would come back for follow-up visit in 3 weeks for evaluation before starting cycle #9. The patient was advised to call immediately if she has any concerning symptoms in the interval. The patient voices understanding of current disease status and treatment options and is in agreement with the current care plan.  All questions were answered. The patient knows to call the clinic with any problems,  questions or concerns. We can certainly see the patient much sooner if necessary. I spent 10 minutes counseling the patient face to face. The total time spent in the appointment was 15 minutes.  Disclaimer: This note was dictated with voice recognition software. Similar sounding words can inadvertently be transcribed and may not be corrected upon review.

## 2016-08-20 ENCOUNTER — Encounter: Payer: Self-pay | Admitting: Internal Medicine

## 2016-09-09 ENCOUNTER — Ambulatory Visit (HOSPITAL_BASED_OUTPATIENT_CLINIC_OR_DEPARTMENT_OTHER): Payer: Medicare Other

## 2016-09-09 ENCOUNTER — Other Ambulatory Visit (HOSPITAL_BASED_OUTPATIENT_CLINIC_OR_DEPARTMENT_OTHER): Payer: Medicare Other

## 2016-09-09 ENCOUNTER — Encounter: Payer: Self-pay | Admitting: Internal Medicine

## 2016-09-09 ENCOUNTER — Telehealth: Payer: Self-pay | Admitting: Internal Medicine

## 2016-09-09 ENCOUNTER — Ambulatory Visit (HOSPITAL_BASED_OUTPATIENT_CLINIC_OR_DEPARTMENT_OTHER): Payer: Medicare Other | Admitting: Internal Medicine

## 2016-09-09 VITALS — BP 150/89 | HR 95

## 2016-09-09 VITALS — BP 135/92 | HR 104 | Temp 98.1°F | Resp 18 | Ht 69.0 in | Wt 164.8 lb

## 2016-09-09 DIAGNOSIS — C7951 Secondary malignant neoplasm of bone: Secondary | ICD-10-CM

## 2016-09-09 DIAGNOSIS — C3491 Malignant neoplasm of unspecified part of right bronchus or lung: Secondary | ICD-10-CM

## 2016-09-09 DIAGNOSIS — C3431 Malignant neoplasm of lower lobe, right bronchus or lung: Secondary | ICD-10-CM

## 2016-09-09 DIAGNOSIS — Z5111 Encounter for antineoplastic chemotherapy: Secondary | ICD-10-CM

## 2016-09-09 LAB — COMPREHENSIVE METABOLIC PANEL
ALT: 39 U/L (ref 0–55)
AST: 41 U/L — ABNORMAL HIGH (ref 5–34)
Albumin: 3.9 g/dL (ref 3.5–5.0)
Alkaline Phosphatase: 68 U/L (ref 40–150)
Anion Gap: 10 mEq/L (ref 3–11)
BUN: 19.9 mg/dL (ref 7.0–26.0)
CO2: 24 mEq/L (ref 22–29)
Calcium: 10.4 mg/dL (ref 8.4–10.4)
Chloride: 104 mEq/L (ref 98–109)
Creatinine: 1.2 mg/dL — ABNORMAL HIGH (ref 0.6–1.1)
EGFR: 47 mL/min/{1.73_m2} — ABNORMAL LOW (ref >90)
Glucose: 127 mg/dl (ref 70–140)
Potassium: 4.4 mEq/L (ref 3.5–5.1)
Sodium: 138 mEq/L (ref 136–145)
Total Bilirubin: 0.7 mg/dL (ref 0.20–1.20)
Total Protein: 7.6 g/dL (ref 6.4–8.3)

## 2016-09-09 LAB — CBC WITH DIFFERENTIAL/PLATELET
BASO%: 0.1 % (ref 0.0–2.0)
Basophils Absolute: 0 10*3/uL (ref 0.0–0.1)
EOS%: 0 % (ref 0.0–7.0)
Eosinophils Absolute: 0 10*3/uL (ref 0.0–0.5)
HCT: 35.5 % (ref 34.8–46.6)
HGB: 11.8 g/dL (ref 11.6–15.9)
LYMPH%: 9.4 % — ABNORMAL LOW (ref 14.0–49.7)
MCH: 32 pg (ref 25.1–34.0)
MCHC: 33.2 g/dL (ref 31.5–36.0)
MCV: 96.2 fL (ref 79.5–101.0)
MONO#: 0.2 10*3/uL (ref 0.1–0.9)
MONO%: 2.2 % (ref 0.0–14.0)
NEUT#: 5.9 10*3/uL (ref 1.5–6.5)
NEUT%: 88.3 % — ABNORMAL HIGH (ref 38.4–76.8)
Platelets: 268 10*3/uL (ref 145–400)
RBC: 3.69 10*6/uL — ABNORMAL LOW (ref 3.70–5.45)
RDW: 14.7 % — ABNORMAL HIGH (ref 11.2–14.5)
WBC: 6.7 10*3/uL (ref 3.9–10.3)
lymph#: 0.6 10*3/uL — ABNORMAL LOW (ref 0.9–3.3)

## 2016-09-09 MED ORDER — SODIUM CHLORIDE 0.9 % IV SOLN
Freq: Once | INTRAVENOUS | Status: AC
  Administered 2016-09-09: 13:00:00 via INTRAVENOUS

## 2016-09-09 MED ORDER — PROCHLORPERAZINE MALEATE 10 MG PO TABS
ORAL_TABLET | ORAL | Status: AC
  Filled 2016-09-09: qty 1

## 2016-09-09 MED ORDER — SODIUM CHLORIDE 0.9% FLUSH
10.0000 mL | INTRAVENOUS | Status: DC | PRN
  Administered 2016-09-09: 10 mL
  Filled 2016-09-09: qty 10

## 2016-09-09 MED ORDER — CYANOCOBALAMIN 1000 MCG/ML IJ SOLN
INTRAMUSCULAR | Status: AC
  Filled 2016-09-09: qty 1

## 2016-09-09 MED ORDER — SODIUM CHLORIDE 0.9 % IV SOLN
525.0000 mg/m2 | Freq: Once | INTRAVENOUS | Status: AC
  Administered 2016-09-09: 1000 mg via INTRAVENOUS
  Filled 2016-09-09: qty 40

## 2016-09-09 MED ORDER — CYANOCOBALAMIN 1000 MCG/ML IJ SOLN
1000.0000 ug | Freq: Once | INTRAMUSCULAR | Status: AC
  Administered 2016-09-09: 1000 ug via INTRAMUSCULAR

## 2016-09-09 MED ORDER — HEPARIN SOD (PORK) LOCK FLUSH 100 UNIT/ML IV SOLN
500.0000 [IU] | Freq: Once | INTRAVENOUS | Status: AC | PRN
  Administered 2016-09-09: 500 [IU]
  Filled 2016-09-09: qty 5

## 2016-09-09 MED ORDER — PROCHLORPERAZINE MALEATE 10 MG PO TABS
10.0000 mg | ORAL_TABLET | Freq: Once | ORAL | Status: AC
  Administered 2016-09-09: 10 mg via ORAL

## 2016-09-09 NOTE — Patient Instructions (Signed)
[facility] Discharge Instructions for Patients Receiving Chemotherapy  Today you received the following chemotherapy agents; Alimta.   To help prevent nausea and vomiting after your treatment, we encourage you to take your nausea medication as directed.    If you develop nausea and vomiting that is not controlled by your nausea medication, call the clinic.   BELOW ARE SYMPTOMS THAT SHOULD BE REPORTED IMMEDIATELY:  *FEVER GREATER THAN 100.5 F  *CHILLS WITH OR WITHOUT FEVER  NAUSEA AND VOMITING THAT IS NOT CONTROLLED WITH YOUR NAUSEA MEDICATION  *UNUSUAL SHORTNESS OF BREATH  *UNUSUAL BRUISING OR BLEEDING  TENDERNESS IN MOUTH AND THROAT WITH OR WITHOUT PRESENCE OF ULCERS  *URINARY PROBLEMS  *BOWEL PROBLEMS  UNUSUAL RASH Items with * indicate a potential emergency and should be followed up as soon as possible.  Feel free to call the clinic you have any questions or concerns. The clinic phone number is [phone].  Please show the CHEMO ALERT CARD at check-in to the Emergency Department and triage nurse.   

## 2016-09-09 NOTE — Telephone Encounter (Signed)
Scheduled appts per 09/09/2016 los - patient to receive new schedule in treatment area.

## 2016-09-09 NOTE — Progress Notes (Signed)
[place] [phone]   Fax:([phone]) [place], [place] [address] [place] [address]  DIAGNOSIS: Stage IV (T1b, N2, M1b) non-small cell lung cancer, adenocarcinoma diagnosed in March 2017 and presented with right lower lobe lung nodule in addition to mediastinal lymphadenopathy and metastatic bone lesions.  Molecular studies: PDL 1 TPS  <1%.   Foundation One Studies: Positive for ERBB2 [id number]. Negative for EGFR, KRAS, ALK, BRAF, MET, RET and ROS1.  PRIOR THERAPY: Induction systemic chemotherapy with carboplatin for AUC of 5 and Alimta 500 MG/M2 every 3 weeks is status post 6 cycles at the [facility], last dose was given 03/05/2016 with stable disease.  CURRENT THERAPY::  1) Maintenance chemotherapy with Alimta 500 MG/M2 every 3 weeks status post 8 cycles. First dose was given 03/25/2016.  2) Zometa 4 mg IV every 12 week for metastatic bone disease.  INTERVAL HISTORY: Valerie Long 71 y.o. female returns to the clinic today for follow-up visit accompanied by her sister. The patient is feeling fine today with no specific complaints. She tolerated the last cycle of her treatment well. She denied having any chest pain, shortness of breath, cough or hemoptysis. She has no fever or chills. She denied having any nausea, vomiting, diarrhea or constipation. She has no fever or chills. She is here today for evaluation before starting cycle #9 of her treatment.  MEDICAL HISTORY: Past Medical History:  Diagnosis Date  . Adenocarcinoma of right lung, stage 4 ([place]) 2017  . Adenocarcinoma of right lung, stage 4 ([place])   . Anemia   . Arthritis   . Encounter for antineoplastic chemotherapy 03/17/2016  . Hypercholesterolemia   . Hypertension 06/18/2016  . Osteopenia   . 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 Pneumonia   . Shortness of breath dyspnea     ALLERGIES:  has No Known  Allergies.  MEDICATIONS:  Current Outpatient Prescriptions  Medication Sig Dispense Refill  . acetaminophen (TYLENOL) 500 MG tablet Take 1,000 mg by mouth every 6 (six) hours as needed for moderate pain or headache.    . Calcium Carb-Cholecalciferol (CALCIUM 600/VITAMIN D3 PO) Take by mouth.    . Cyanocobalamin (B-12 PO) Take 2 tablets by mouth daily.    Marland Kitchen dexamethasone (DECADRON) 4 MG tablet Take 4 tablets (16 mg total) by mouth 2 (two) times daily. Take 21m by mouth twice daily the day before, of, and after chemo (Patient taking differently: Take 4 mg by mouth 2 (two) times daily. Take 473mby mouth twice daily the day before, of, and after chemo) 30 tablet 2  . Fish Oil-Cholecalciferol (OMEGA-3 + VITAMIN D3 PO) Take 1 tablet by mouth daily.    . folic acid (FOLVITE) 1 MG tablet Take 1 tablet (1 mg total) by mouth daily. 90 tablet 0  . lidocaine-prilocaine (EMLA) cream Apply 1 application topically as needed. Apply 1-2 tsp over port site 1-2 hours prior to chemo. 30 g 0  . PRESCRIPTION MEDICATION Inject 1 Dose as directed every 21 ( twenty-one) days. Chemo    . benzonatate (TESSALON) 100 MG capsule Take 100 mg by mouth 3 (three) times daily as needed. cough    . HYDROcodone-homatropine (HYCODAN) 5-1.5 MG/5ML syrup Take 5 mLs by mouth every 6 (six) hours as needed for cough. (Patient not taking: Reported on 08/19/2016) 120 mL 0  . ondansetron (ZOFRAN) 8 MG tablet Take 8  mg by mouth every 8 (eight) hours as needed.    . prochlorperazine (COMPAZINE) 10 MG tablet Take 10 mg by mouth every 6 (six) hours as needed for nausea.   2   No current facility-administered medications for this visit.     SURGICAL HISTORY:  Past Surgical History:  Procedure Laterality Date  . CESAREAN SECTION     myomectomy  . COLONOSCOPY W/ POLYPECTOMY    . IR GENERIC HISTORICAL  08/17/2016   IR US GUIDE VASC ACCESS RIGHT 08/17/2016 [name], MD [facility]  . IR GENERIC HISTORICAL  08/17/2016   IR FLUORO GUIDE  PORT INSERTION RIGHT 08/17/2016 [name], MD [facility]  . MEDIASTINOSCOPY N/A 09/25/2015   Procedure: MEDIASTINOSCOPY;  Surgeon: [place] Nakayama, MD;  Location: [facility];  Service: Thoracic;  Laterality: N/A;  . VIDEO BRONCHOSCOPY Bilateral 08/19/2015   Procedure: VIDEO BRONCHOSCOPY WITH FLUORO;  Surgeon: Rigoberto Noel, MD;  Location: [facility];  Service: Cardiopulmonary;  Laterality: Bilateral;  . VIDEO BRONCHOSCOPY N/A 09/08/2015   Procedure: VIDEO BRONCHOSCOPY WITH FLUORO;  Surgeon: Rigoberto Noel, MD;  Location: [facility];  Service: Thoracic;  Laterality: N/A;  . VIDEO BRONCHOSCOPY WITH ENDOBRONCHIAL ULTRASOUND Right 09/08/2015   Procedure: ATTEMPTED VIDEO BRONCHOSCOPY ENDOBRONCHIAL ULTRASOUND  ;  Surgeon: Rigoberto Noel, MD;  Location: [facility];  Service: Thoracic;  Laterality: Right;    REVIEW OF SYSTEMS:  A comprehensive review of systems was negative.   PHYSICAL EXAMINATION: General appearance: alert, cooperative and no distress Head: Normocephalic, without obvious abnormality, atraumatic Neck: no adenopathy, no JVD, supple, symmetrical, trachea midline and thyroid not enlarged, symmetric, no tenderness/mass/nodules Lymph nodes: Cervical, supraclavicular, and axillary nodes normal. Resp: clear to auscultation bilaterally Back: symmetric, no curvature. ROM normal. No CVA tenderness. Cardio: regular rate and rhythm, S1, S2 normal, no murmur, click, rub or gallop GI: soft, non-tender; bowel sounds normal; no masses,  no organomegaly Extremities: extremities normal, atraumatic, no cyanosis or edema  ECOG PERFORMANCE STATUS: 0 - Asymptomatic  Blood pressure (!) 135/92, pulse (!) 104, temperature 98.1 F (36.7 C), temperature source Oral, resp. rate 18, height 5' 9" (1.753 m), weight 164 lb 12.8 oz (74.8 kg), last menstrual period 06/09/1998, SpO2 100 %.  LABORATORY DATA: Lab Results  Component Value Date   WBC 6.7 09/09/2016   HGB 11.8 09/09/2016   HCT 35.5 09/09/2016   MCV 96.2  09/09/2016   PLT 268 09/09/2016      Chemistry      Component Value Date/Time   NA 139 08/19/2016 1151   K 4.0 08/19/2016 1151   CL 107 09/25/2015 0805   CO2 25 08/19/2016 1151   BUN 20.2 08/19/2016 1151   CREATININE 1.1 08/19/2016 1151   GLU 170 01/23/2016      Component Value Date/Time   CALCIUM 10.3 08/19/2016 1151   ALKPHOS 68 08/19/2016 1151   AST 38 (H) 08/19/2016 1151   ALT 35 08/19/2016 1151   BILITOT 0.72 08/19/2016 1151       RADIOGRAPHIC STUDIES: Ir US Guide Vasc Access Right  Result Date: 08/17/2016 INDICATION: 71 year old female with new diagnosis of right lung adenocarcinoma. Durable venous access is required for chemotherapy. EXAM: IMPLANTED PORT A CATH PLACEMENT WITH ULTRASOUND AND FLUOROSCOPIC GUIDANCE MEDICATIONS: 2 g Ancef; The antibiotic was administered within an appropriate time interval prior to skin puncture. ANESTHESIA/SEDATION: Versed 2.5 mg IV; Fentanyl 200 mcg IV; Moderate Sedation Time:  23 minutes The patient was continuously monitored during the procedure by the interventional radiology  nurse under my direct supervision. FLUOROSCOPY TIME:  0 minutes, 12 seconds (2 mGy) COMPLICATIONS: None immediate. PROCEDURE: The right neck and chest was prepped with chlorhexidine, and draped in the usual sterile fashion using maximum barrier technique (cap and mask, sterile gown, sterile gloves, large sterile sheet, hand hygiene and cutaneous antiseptic). Antibiotic prophylaxis was provided with 2g Ancef administered IV one hour prior to skin incision. Local anesthesia was attained by infiltration with 1% lidocaine with epinephrine. Ultrasound demonstrated patency of the right internal jugular vein, and this was documented with an image. Under real-time ultrasound guidance, this vein was accessed with a 21 gauge micropuncture needle and image documentation was performed. A small dermatotomy was made at the access site with an 11 scalpel. A 0.018" wire was advanced into the  SVC and the access needle exchanged for a 67F micropuncture vascular sheath. The 0.018" wire was then removed and a 0.035" wire advanced into the IVC. An appropriate location for the subcutaneous reservoir was selected below the clavicle and an incision was made through the skin and underlying soft tissues. The subcutaneous tissues were then dissected using a combination of blunt and sharp surgical technique and a pocket was formed. A single lumen power injectable portacatheter was then tunneled through the subcutaneous tissues from the pocket to the dermatotomy and the port reservoir placed within the subcutaneous pocket. The venous access site was then serially dilated and a peel away vascular sheath placed over the wire. The wire was removed and the port catheter advanced into position under fluoroscopic guidance. The catheter tip is positioned in the upper right atrium. This was documented with a spot image. The portacatheter was then tested and found to flush and aspirate well. The port was flushed with saline followed by 100 units/mL heparinized saline. The pocket was then closed in two layers using first subdermal inverted interrupted absorbable sutures followed by a running subcuticular suture. The epidermis was then sealed with Dermabond. The dermatotomy at the venous access site was also closed with a single inverted subdermal suture and the epidermis sealed with Dermabond. IMPRESSION: Successful placement of a right IJ approach Power Port with ultrasound and fluoroscopic guidance. The catheter is ready for use. Signed, Criselda Peaches, MD [facility] Radiology Electronically Signed   By: [name] M.D.   On: 08/17/2016 17:08   Ir Fluoro Guide Port Insertion Right  Result Date: 08/17/2016 INDICATION: 71 year old female with new diagnosis of right lung adenocarcinoma. Durable venous access is required for chemotherapy. EXAM: IMPLANTED PORT A  CATH PLACEMENT WITH ULTRASOUND AND FLUOROSCOPIC GUIDANCE MEDICATIONS: 2 g Ancef; The antibiotic was administered within an appropriate time interval prior to skin puncture. ANESTHESIA/SEDATION: Versed 2.5 mg IV; Fentanyl 200 mcg IV; Moderate Sedation Time:  23 minutes The patient was continuously monitored during the procedure by the interventional radiology nurse under my direct supervision. FLUOROSCOPY TIME:  0 minutes, 12 seconds (2 mGy) COMPLICATIONS: None immediate. PROCEDURE: The right neck and chest was prepped with chlorhexidine, and draped in the usual sterile fashion using maximum barrier technique (cap and mask, sterile gown, sterile gloves, large sterile sheet, hand hygiene and cutaneous antiseptic). Antibiotic prophylaxis was provided with 2g Ancef administered IV one hour prior to skin incision. Local anesthesia was attained by infiltration with 1% lidocaine with epinephrine. Ultrasound demonstrated patency of the right internal jugular vein, and this was documented with an image. Under real-time ultrasound guidance, this vein was accessed with a 21 gauge micropuncture needle and image documentation  was performed. A small dermatotomy was made at the access site with an 11 scalpel. A 0.018" wire was advanced into the SVC and the access needle exchanged for a 71F micropuncture vascular sheath. The 0.018" wire was then removed and a 0.035" wire advanced into the IVC. An appropriate location for the subcutaneous reservoir was selected below the clavicle and an incision was made through the skin and underlying soft tissues. The subcutaneous tissues were then dissected using a combination of blunt and sharp surgical technique and a pocket was formed. A single lumen power injectable portacatheter was then tunneled through the subcutaneous tissues from the pocket to the dermatotomy and the port reservoir placed within the subcutaneous pocket. The venous access site was then serially dilated and a peel away  vascular sheath placed over the wire. The wire was removed and the port catheter advanced into position under fluoroscopic guidance. The catheter tip is positioned in the upper right atrium. This was documented with a spot image. The portacatheter was then tested and found to flush and aspirate well. The port was flushed with saline followed by 100 units/mL heparinized saline. The pocket was then closed in two layers using first subdermal inverted interrupted absorbable sutures followed by a running subcuticular suture. The epidermis was then sealed with Dermabond. The dermatotomy at the venous access site was also closed with a single inverted subdermal suture and the epidermis sealed with Dermabond. IMPRESSION: Successful placement of a right IJ approach Power Port with ultrasound and fluoroscopic guidance. The catheter is ready for use. Signed, Criselda Peaches, MD [facility] Radiology Electronically Signed   By: [name] M.D.   On: 08/17/2016 17:08    ASSESSMENT AND PLAN:  This is a very pleasant 71 years old Hispanic female with metastatic non-small cell lung cancer, adenocarcinoma status post induction systemic chemotherapy with carboplatin and Alimta. She is currently undergoing treatment with maintenance Alimta status post 8 cycles. She tolerated the last cycle of her treatment well with no significant adverse effects. I recommended for the patient to proceed with cycle #9 today as scheduled. I will see her back for follow-up visit in 3 weeks for evaluation after repeating CT scan of the chest, abdomen and pelvis for restaging of her disease. The patient was advised to call immediately if she has any concerning symptoms in the interval. The patient voices understanding of current disease status and treatment options and is in agreement with the current care plan. All questions were answered. The patient knows to call the clinic with any  problems, questions or concerns. We can certainly see the patient much sooner if necessary. I spent 10 minutes counseling the patient face to face. The total time spent in the appointment was 15 minutes.  Disclaimer: This note was dictated with voice recognition software. Similar sounding words can inadvertently be transcribed and may not be corrected upon review.

## 2016-09-24 ENCOUNTER — Ambulatory Visit (INDEPENDENT_AMBULATORY_CARE_PROVIDER_SITE_OTHER): Payer: Medicare Other | Admitting: Internal Medicine

## 2016-09-24 ENCOUNTER — Encounter: Payer: Self-pay | Admitting: Internal Medicine

## 2016-09-24 VITALS — BP 122/78 | HR 93 | Temp 98.0°F | Resp 14 | Ht 69.0 in | Wt 162.0 lb

## 2016-09-24 DIAGNOSIS — E7800 Pure hypercholesterolemia, unspecified: Secondary | ICD-10-CM | POA: Diagnosis not present

## 2016-09-24 LAB — LIPID PANEL
Cholesterol: 225 mg/dL — ABNORMAL HIGH (ref 0–200)
HDL: 67.1 mg/dL (ref >39.00)
LDL Cholesterol: 130 mg/dL — ABNORMAL HIGH (ref 0–99)
NonHDL: 157.51
Total CHOL/HDL Ratio: 3
Triglycerides: 139 mg/dL (ref 0.0–149.0)
VLDL: 27.8 mg/dL (ref 0.0–40.0)

## 2016-09-24 LAB — TSH: TSH: 1.58 u[IU]/mL (ref 0.35–4.50)

## 2016-09-24 NOTE — Assessment & Plan Note (Signed)
Hyperlipidemia: Encouraged to continue healthy diet, stay active if possible, check a FLP and TSH. Previous labs reviewed, all look okay, patient aware. Lung cancer: Closely follow-up by oncology. RTC one year

## 2016-09-24 NOTE — Patient Instructions (Signed)
GO TO THE LAB : Get the blood work     GO TO THE FRONT DESK Schedule your next appointment for a  checkup in one year

## 2016-09-24 NOTE — Progress Notes (Signed)
Pre visit review using our clinic review tool, if applicable. No additional management support is needed unless otherwise documented below in the visit note. 

## 2016-09-24 NOTE — Progress Notes (Signed)
Subjective:    Patient ID: Valerie Long, female    DOB: 23-Aug-1945, 71 y.o.   MRN: [id number]  DOS:  09/24/2016 Type of visit - description : Routine checkup Interval history: Patient has lung cancer, reports has her last chemotherapy 09/09/2016, next chemotherapy 09-29-2016 In general feeling well, her main concern is to check her cholesterol. She is eating healthier.  Review of Systems Emotionally is doing well. Denies chest pain or difficulty breathing. Occasional lower extremity edema. no nausea, vomiting, diarrhea  Past Medical History:  Diagnosis Date  . Adenocarcinoma of right lung, stage 4 ([place]) 2017  . Adenocarcinoma of right lung, stage 4 ([place])   . Anemia   . Arthritis   . Encounter for antineoplastic chemotherapy 03/17/2016  . Hypercholesterolemia   . Hypertension 06/18/2016  . Osteopenia   . 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 Pneumonia   . Shortness of breath dyspnea     Past Surgical History:  Procedure Laterality Date  . CESAREAN SECTION     myomectomy  . COLONOSCOPY W/ POLYPECTOMY    . IR GENERIC HISTORICAL  08/17/2016   IR US GUIDE VASC ACCESS RIGHT 08/17/2016 [name], MD [facility]  . IR GENERIC HISTORICAL  08/17/2016   IR FLUORO GUIDE PORT INSERTION RIGHT 08/17/2016 [name], MD [facility]  . MEDIASTINOSCOPY N/A 09/25/2015   Procedure: MEDIASTINOSCOPY;  Surgeon: [place] Nakayama, MD;  Location: [place];  Service: Thoracic;  Laterality: N/A;  . VIDEO BRONCHOSCOPY Bilateral 08/19/2015   Procedure: VIDEO BRONCHOSCOPY WITH FLUORO;  Surgeon: Rigoberto Noel, MD;  Location: [facility];  Service: Cardiopulmonary;  Laterality: Bilateral;  . VIDEO BRONCHOSCOPY N/A 09/08/2015   Procedure: VIDEO BRONCHOSCOPY WITH FLUORO;  Surgeon: Rigoberto Noel, MD;  Location: [facility];  Service: Thoracic;  Laterality: N/A;  . VIDEO BRONCHOSCOPY WITH ENDOBRONCHIAL ULTRASOUND Right 09/08/2015   Procedure: ATTEMPTED VIDEO BRONCHOSCOPY ENDOBRONCHIAL  ULTRASOUND  ;  Surgeon: Rigoberto Noel, MD;  Location: [facility];  Service: Thoracic;  Laterality: Right;    Social History   Social History  . Marital status: Legally Separated    Spouse name: N/A  . Number of children: 1  . Years of education: N/A   Occupational History  . retired  Sports coach Employed   Social History Main Topics  . Smoking status: Never Smoker  . Smokeless tobacco: Never Used  . Alcohol use 0.0 oz/week     Comment: SOCIAL  . Drug use: No  . Sexual activity: No   Other Topics Concern  . Not on file   Social History Narrative   From [place], moved from [place]  to [place] 2008   Lives w/ her mother and her son       Allergies as of 09/24/2016   No Known Allergies     Medication List       Accurate as of 09/24/16  2:00 PM. Always use your most recent med list.          acetaminophen 500 MG tablet Commonly known as:  TYLENOL Take 1,000 mg by mouth every 6 (six) hours as needed for moderate pain or headache.   B-12 PO Take 2 tablets by mouth daily.   benzonatate 100 MG capsule Commonly known as:  TESSALON Take 100 mg by mouth 3 (three) times daily as needed. cough   CALCIUM 600/VITAMIN D3 PO Take by mouth.   dexamethasone 4 MG tablet Commonly known as:  DECADRON Take 4  tablets (16 mg total) by mouth 2 (two) times daily. Take '4mg'$  by mouth twice daily the day before, of, and after chemo   folic acid 1 MG tablet Commonly known as:  FOLVITE Take 1 tablet (1 mg total) by mouth daily.   lidocaine-prilocaine cream Commonly known as:  EMLA Apply 1 application topically as needed. Apply 1-2 tsp over port site 1-2 hours prior to chemo.   OMEGA-3 + VITAMIN D3 PO Take 1 tablet by mouth daily.   ondansetron 8 MG tablet Commonly known as:  ZOFRAN Take 8 mg by mouth every 8 (eight) hours as needed.   PRESCRIPTION MEDICATION Inject 1 Dose as directed every 21 ( twenty-one) days. Chemo   prochlorperazine 10 MG tablet Commonly known as:   COMPAZINE Take 10 mg by mouth every 6 (six) hours as needed for nausea.          Objective:   Physical Exam BP 122/78 (BP Location: Left Arm, Patient Position: Sitting, Cuff Size: Small)   Pulse 93   Temp 98 F (36.7 C) (Oral)   Resp 14   Ht '5\' 9"'$  (1.753 m)   Wt 162 lb (73.5 kg)   LMP 06/09/1998   SpO2 96%   BMI 23.92 kg/m  General:   Well developed, well nourished . NAD.  HEENT:  Normocephalic . Face symmetric, atraumatic Lungs:  Decreased breath sounds otherwise clear Normal respiratory effort, no intercostal retractions, no accessory muscle use. Heart: RRR,  no murmur.  Trace dorsal edema bilaterally  Skin: Not pale. Not jaundice Neurologic:  alert & oriented X3.  Speech normal, gait appropriate for age and unassisted Psych--  Cognition and judgment appear intact.  Cooperative with normal attention span and concentration.  Behavior appropriate. No anxious or depressed appearing.      Assessment & Plan:   Assessment  Hyperlipidemia Osteopenia Left pelvic kidney -- by CT done at [place]  Abnormal CT chest, very suspicious for  Lung Ca, scheduled mediastinoscopy 09/25/2015 Non-small cell lung cancer-Stage IV (T1b, N2, M1b) . Dx  March 2017  PLAN: Hyperlipidemia: Encouraged to continue healthy diet, stay active if possible, check a FLP and TSH. Previous labs reviewed, all look okay, patient aware. Lung cancer: Closely follow-up by oncology. RTC one year

## 2016-09-27 ENCOUNTER — Telehealth: Payer: Self-pay | Admitting: Internal Medicine

## 2016-09-27 NOTE — Telephone Encounter (Signed)
Adjusted 4/25 lab to 9:30 am prior to f/u visit 4/25. Left message for patient.

## 2016-09-28 ENCOUNTER — Ambulatory Visit (HOSPITAL_COMMUNITY)
Admission: RE | Admit: 2016-09-28 | Discharge: 2016-09-28 | Disposition: A | Discharge status: Home / Self Care | Payer: Medicare Other | Source: Ambulatory Visit | Attending: Internal Medicine | Admitting: Internal Medicine

## 2016-09-28 ENCOUNTER — Encounter (HOSPITAL_COMMUNITY): Payer: Self-pay

## 2016-09-28 DIAGNOSIS — C3491 Malignant neoplasm of unspecified part of right bronchus or lung: Secondary | ICD-10-CM

## 2016-09-28 DIAGNOSIS — I517 Cardiomegaly: Secondary | ICD-10-CM | POA: Diagnosis not present

## 2016-09-28 DIAGNOSIS — I700 Atherosclerosis of aorta: Secondary | ICD-10-CM | POA: Diagnosis not present

## 2016-09-28 DIAGNOSIS — Z5111 Encounter for antineoplastic chemotherapy: Secondary | ICD-10-CM

## 2016-09-28 DIAGNOSIS — R590 Localized enlarged lymph nodes: Secondary | ICD-10-CM | POA: Insufficient documentation

## 2016-09-28 DIAGNOSIS — C7951 Secondary malignant neoplasm of bone: Secondary | ICD-10-CM | POA: Insufficient documentation

## 2016-09-28 DIAGNOSIS — J9811 Atelectasis: Secondary | ICD-10-CM | POA: Diagnosis not present

## 2016-09-28 DIAGNOSIS — K8020 Calculus of gallbladder without cholecystitis without obstruction: Secondary | ICD-10-CM | POA: Diagnosis not present

## 2016-09-28 MED ORDER — IOPAMIDOL (ISOVUE-300) INJECTION 61%
INTRAVENOUS | Status: AC
  Filled 2016-09-28: qty 75

## 2016-09-28 MED ORDER — IOPAMIDOL (ISOVUE-300) INJECTION 61%
75.0000 mL | Freq: Once | INTRAVENOUS | Status: AC | PRN
  Administered 2016-09-28: 75 mL via INTRAVENOUS

## 2016-09-29 ENCOUNTER — Telehealth: Payer: Self-pay | Admitting: Internal Medicine

## 2016-09-29 ENCOUNTER — Other Ambulatory Visit (HOSPITAL_BASED_OUTPATIENT_CLINIC_OR_DEPARTMENT_OTHER): Payer: Medicare Other

## 2016-09-29 ENCOUNTER — Ambulatory Visit (HOSPITAL_BASED_OUTPATIENT_CLINIC_OR_DEPARTMENT_OTHER): Payer: Medicare Other | Admitting: Internal Medicine

## 2016-09-29 ENCOUNTER — Encounter: Payer: Self-pay | Admitting: Internal Medicine

## 2016-09-29 ENCOUNTER — Ambulatory Visit (HOSPITAL_BASED_OUTPATIENT_CLINIC_OR_DEPARTMENT_OTHER): Payer: Medicare Other

## 2016-09-29 VITALS — BP 149/85 | HR 86 | Temp 98.0°F | Resp 18 | Ht 69.0 in | Wt 166.3 lb

## 2016-09-29 DIAGNOSIS — R530 Neoplastic (malignant) related fatigue: Secondary | ICD-10-CM | POA: Diagnosis not present

## 2016-09-29 DIAGNOSIS — C3431 Malignant neoplasm of lower lobe, right bronchus or lung: Secondary | ICD-10-CM

## 2016-09-29 DIAGNOSIS — C7951 Secondary malignant neoplasm of bone: Secondary | ICD-10-CM

## 2016-09-29 DIAGNOSIS — C3491 Malignant neoplasm of unspecified part of right bronchus or lung: Secondary | ICD-10-CM

## 2016-09-29 DIAGNOSIS — T451X5A Adverse effect of antineoplastic and immunosuppressive drugs, initial encounter: Secondary | ICD-10-CM

## 2016-09-29 DIAGNOSIS — Z5111 Encounter for antineoplastic chemotherapy: Secondary | ICD-10-CM | POA: Diagnosis present

## 2016-09-29 DIAGNOSIS — D6481 Anemia due to antineoplastic chemotherapy: Secondary | ICD-10-CM

## 2016-09-29 DIAGNOSIS — R110 Nausea: Secondary | ICD-10-CM

## 2016-09-29 DIAGNOSIS — R05 Cough: Secondary | ICD-10-CM | POA: Diagnosis not present

## 2016-09-29 LAB — COMPREHENSIVE METABOLIC PANEL
ALT: 50 U/L (ref 0–55)
AST: 47 U/L — ABNORMAL HIGH (ref 5–34)
Albumin: 3.8 g/dL (ref 3.5–5.0)
Alkaline Phosphatase: 68 U/L (ref 40–150)
Anion Gap: 11 mEq/L (ref 3–11)
BUN: 21.3 mg/dL (ref 7.0–26.0)
CO2: 23 mEq/L (ref 22–29)
Calcium: 9.5 mg/dL (ref 8.4–10.4)
Chloride: 103 mEq/L (ref 98–109)
Creatinine: 1.1 mg/dL (ref 0.6–1.1)
EGFR: 53 mL/min/{1.73_m2} — ABNORMAL LOW (ref >90)
Glucose: 116 mg/dl (ref 70–140)
Potassium: 3.8 mEq/L (ref 3.5–5.1)
Sodium: 138 mEq/L (ref 136–145)
Total Bilirubin: 0.65 mg/dL (ref 0.20–1.20)
Total Protein: 7.3 g/dL (ref 6.4–8.3)

## 2016-09-29 LAB — CBC WITH DIFFERENTIAL/PLATELET
BASO%: 0.2 % (ref 0.0–2.0)
Basophils Absolute: 0 10*3/uL (ref 0.0–0.1)
EOS%: 0 % (ref 0.0–7.0)
Eosinophils Absolute: 0 10*3/uL (ref 0.0–0.5)
HCT: 33.4 % — ABNORMAL LOW (ref 34.8–46.6)
HGB: 11.3 g/dL — ABNORMAL LOW (ref 11.6–15.9)
LYMPH%: 10.4 % — ABNORMAL LOW (ref 14.0–49.7)
MCH: 32.8 pg (ref 25.1–34.0)
MCHC: 33.9 g/dL (ref 31.5–36.0)
MCV: 96.8 fL (ref 79.5–101.0)
MONO#: 0.6 10*3/uL (ref 0.1–0.9)
MONO%: 6.5 % (ref 0.0–14.0)
NEUT#: 7.3 10*3/uL — ABNORMAL HIGH (ref 1.5–6.5)
NEUT%: 82.9 % — ABNORMAL HIGH (ref 38.4–76.8)
Platelets: 242 10*3/uL (ref 145–400)
RBC: 3.45 10*6/uL — ABNORMAL LOW (ref 3.70–5.45)
RDW: 15.8 % — ABNORMAL HIGH (ref 11.2–14.5)
WBC: 8.8 10*3/uL (ref 3.9–10.3)
lymph#: 0.9 10*3/uL (ref 0.9–3.3)

## 2016-09-29 MED ORDER — SODIUM CHLORIDE 0.9% FLUSH
10.0000 mL | INTRAVENOUS | Status: DC | PRN
Start: 2016-09-29 — End: 2016-09-29
  Administered 2016-09-29: 10 mL
  Filled 2016-09-29: qty 10

## 2016-09-29 MED ORDER — SODIUM CHLORIDE 0.9 % IV SOLN
530.0000 mg/m2 | Freq: Once | INTRAVENOUS | Status: AC
  Administered 2016-09-29: 1000 mg via INTRAVENOUS
  Filled 2016-09-29: qty 40

## 2016-09-29 MED ORDER — SODIUM CHLORIDE 0.9 % IV SOLN
Freq: Once | INTRAVENOUS | Status: AC
  Administered 2016-09-29: 11:00:00 via INTRAVENOUS

## 2016-09-29 MED ORDER — PROCHLORPERAZINE MALEATE 10 MG PO TABS
10.0000 mg | ORAL_TABLET | Freq: Once | ORAL | Status: AC
  Administered 2016-09-29: 10 mg via ORAL

## 2016-09-29 MED ORDER — PROCHLORPERAZINE MALEATE 10 MG PO TABS
ORAL_TABLET | ORAL | Status: AC
  Filled 2016-09-29: qty 1

## 2016-09-29 MED ORDER — HEPARIN SOD (PORK) LOCK FLUSH 100 UNIT/ML IV SOLN
500.0000 [IU] | Freq: Once | INTRAVENOUS | Status: AC | PRN
Start: 2016-09-29 — End: 2016-09-29
  Administered 2016-09-29: 500 [IU]
  Filled 2016-09-29: qty 5

## 2016-09-29 NOTE — Patient Instructions (Signed)
[facility] Discharge Instructions for Patients Receiving Chemotherapy  Today you received the following chemotherapy agents; Alimta.   To help prevent nausea and vomiting after your treatment, we encourage you to take your nausea medication as directed.    If you develop nausea and vomiting that is not controlled by your nausea medication, call the clinic.   BELOW ARE SYMPTOMS THAT SHOULD BE REPORTED IMMEDIATELY:  *FEVER GREATER THAN 100.5 F  *CHILLS WITH OR WITHOUT FEVER  NAUSEA AND VOMITING THAT IS NOT CONTROLLED WITH YOUR NAUSEA MEDICATION  *UNUSUAL SHORTNESS OF BREATH  *UNUSUAL BRUISING OR BLEEDING  TENDERNESS IN MOUTH AND THROAT WITH OR WITHOUT PRESENCE OF ULCERS  *URINARY PROBLEMS  *BOWEL PROBLEMS  UNUSUAL RASH Items with * indicate a potential emergency and should be followed up as soon as possible.  Feel free to call the clinic you have any questions or concerns. The clinic phone number is [phone].  Please show the CHEMO ALERT CARD at check-in to the Emergency Department and triage nurse.   

## 2016-09-29 NOTE — Telephone Encounter (Signed)
Appointments scheduled per 4.25.18 LOS. Patient given AVS report and calendars with future scheduled appointments.

## 2016-09-29 NOTE — Progress Notes (Signed)
[facility] Telephone:([phone]) [phone]   Fax:(336) [place], MD [place] [place] [address]  DIAGNOSIS: Stage IV (T1b, N2, M1b) non-small cell lung cancer, adenocarcinoma diagnosed in March 2017 and presented with right lower lobe lung nodule in addition to mediastinal lymphadenopathy and metastatic bone lesions.  Molecular studies: PDL 1 TPS  <1%.   Foundation One Studies: Positive for ERBB2 [id number]. Negative for EGFR, KRAS, ALK, BRAF, MET, RET and ROS1.  PRIOR THERAPY: Induction systemic chemotherapy with carboplatin for AUC of 5 and Alimta 500 MG/M2 every 3 weeks is status post 6 cycles at the [facility], last dose was given 03/05/2016 with stable disease.  CURRENT THERAPY::  1) Maintenance chemotherapy with Alimta 500 MG/M2 every 3 weeks status post 9 cycles. First dose was given 03/25/2016.  2) Zometa 4 mg IV every 12 week for metastatic bone disease.  INTERVAL HISTORY: Valerie Long 71 y.o. female returns to the clinic today for follow-up visit accompanied by her sister. The patient is feeling fine today with no specific complaints except for mild fatigue after the chemotherapy. She denied having any fever or chills. She has no nausea, vomiting, diarrhea or constipation. She denied having any weight loss or night sweats. She has occasional dizzy spells. The patient had MRI of the brain earlier this month that showed no evidence of metastatic disease to the brain. She had repeat CT scan of the chest, abdomen and pelvis performed recently and she is here for evaluation and discussion of her scan results.  MEDICAL HISTORY: Past Medical History:  Diagnosis Date  . Adenocarcinoma of right lung, stage 4 ([place]) 2017  . Adenocarcinoma of right lung, stage 4 ([place])   . Anemia   . Arthritis   . Encounter for antineoplastic chemotherapy 03/17/2016  . Hypercholesterolemia   . Hypertension 06/18/2016    . Osteopenia   . 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 Pneumonia   . Shortness of breath dyspnea     ALLERGIES:  has No Known Allergies.  MEDICATIONS:  Current Outpatient Prescriptions  Medication Sig Dispense Refill  . acetaminophen (TYLENOL) 500 MG tablet Take 1,000 mg by mouth every 6 (six) hours as needed for moderate pain or headache.    . benzonatate (TESSALON) 100 MG capsule Take 100 mg by mouth 3 (three) times daily as needed. cough    . Calcium Carb-Cholecalciferol (CALCIUM 600/VITAMIN D3 PO) Take by mouth.    . Cyanocobalamin (B-12 PO) Take 2 tablets by mouth daily.    Marland Kitchen dexamethasone (DECADRON) 4 MG tablet Take 4 tablets (16 mg total) by mouth 2 (two) times daily. Take 60m by mouth twice daily the day before, of, and after chemo (Patient taking differently: Take 4 mg by mouth 2 (two) times daily. Take 423mby mouth twice daily the day before, of, and after chemo) 30 tablet 2  . Fish Oil-Cholecalciferol (OMEGA-3 + VITAMIN D3 PO) Take 1 tablet by mouth daily.    . folic acid (FOLVITE) 1 MG tablet Take 1 tablet (1 mg total) by mouth daily. 90 tablet 0  . lidocaine-prilocaine (EMLA) cream Apply 1 application topically as needed. Apply 1-2 tsp over port site 1-2 hours prior to chemo. 30 g 0  . ondansetron (ZOFRAN) 8 MG tablet Take 8 mg by mouth every 8 (eight) hours as needed.    . Marland KitchenRESCRIPTION MEDICATION Inject 1 Dose as  directed every 21 ( twenty-one) days. Chemo    . prochlorperazine (COMPAZINE) 10 MG tablet Take 10 mg by mouth every 6 (six) hours as needed for nausea.   2   No current facility-administered medications for this visit.     SURGICAL HISTORY:  Past Surgical History:  Procedure Laterality Date  . CESAREAN SECTION     myomectomy  . COLONOSCOPY W/ POLYPECTOMY    . IR GENERIC HISTORICAL  08/17/2016   IR US GUIDE VASC ACCESS RIGHT 08/17/2016 [name], MD [facility]  . IR GENERIC HISTORICAL  08/17/2016   IR FLUORO GUIDE PORT INSERTION  RIGHT 08/17/2016 [name], MD [facility]  . MEDIASTINOSCOPY N/A 09/25/2015   Procedure: MEDIASTINOSCOPY;  Surgeon: Melrose Nakayama, MD;  Location: [facility];  Service: Thoracic;  Laterality: N/A;  . VIDEO BRONCHOSCOPY Bilateral 08/19/2015   Procedure: VIDEO BRONCHOSCOPY WITH FLUORO;  Surgeon: Rigoberto Noel, MD;  Location: [facility];  Service: Cardiopulmonary;  Laterality: Bilateral;  . VIDEO BRONCHOSCOPY N/A 09/08/2015   Procedure: VIDEO BRONCHOSCOPY WITH FLUORO;  Surgeon: Rigoberto Noel, MD;  Location: [facility];  Service: Thoracic;  Laterality: N/A;  . VIDEO BRONCHOSCOPY WITH ENDOBRONCHIAL ULTRASOUND Right 09/08/2015   Procedure: ATTEMPTED VIDEO BRONCHOSCOPY ENDOBRONCHIAL ULTRASOUND  ;  Surgeon: Rigoberto Noel, MD;  Location: [name];  Service: Thoracic;  Laterality: Right;    REVIEW OF SYSTEMS:  Constitutional: positive for fatigue Eyes: negative Ears, nose, mouth, throat, and face: negative Respiratory: negative Cardiovascular: negative Gastrointestinal: negative Genitourinary:negative Integument/breast: negative Hematologic/lymphatic: negative Musculoskeletal:negative Neurological: negative Behavioral/Psych: negative Endocrine: negative Allergic/Immunologic: negative   PHYSICAL EXAMINATION: General appearance: alert, cooperative, fatigued and no distress Head: Normocephalic, without obvious abnormality, atraumatic Neck: no adenopathy, no JVD, supple, symmetrical, trachea midline and thyroid not enlarged, symmetric, no tenderness/mass/nodules Lymph nodes: Cervical, supraclavicular, and axillary nodes normal. Resp: clear to auscultation bilaterally Back: symmetric, no curvature. ROM normal. No CVA tenderness. Cardio: regular rate and rhythm, S1, S2 normal, no murmur, click, rub or gallop GI: soft, non-tender; bowel sounds normal; no masses,  no organomegaly Extremities: extremities normal, atraumatic, no cyanosis or edema Neurologic: Alert and oriented X 3, normal strength and  tone. Normal symmetric reflexes. Normal coordination and gait  ECOG PERFORMANCE STATUS: 0 - Asymptomatic  Blood pressure (!) 149/85, pulse 86, temperature 98 F (36.7 C), temperature source Oral, resp. rate 18, height '5\' 9"'  (1.753 m), weight 166 lb 4.8 oz (75.4 kg), last menstrual period 06/09/1998, SpO2 100 %.  LABORATORY DATA: Lab Results  Component Value Date   WBC 8.8 09/29/2016   HGB 11.3 (L) 09/29/2016   HCT 33.4 (L) 09/29/2016   MCV 96.8 09/29/2016   PLT 242 09/29/2016      Chemistry      Component Value Date/Time   NA 138 09/29/2016 1000   K 3.8 09/29/2016 1000   CL 107 09/25/2015 0805   CO2 23 09/29/2016 1000   BUN 21.3 09/29/2016 1000   CREATININE 1.1 09/29/2016 1000   GLU 170 01/23/2016      Component Value Date/Time   CALCIUM 9.5 09/29/2016 1000   ALKPHOS 68 09/29/2016 1000   AST 47 (H) 09/29/2016 1000   ALT 50 09/29/2016 1000   BILITOT 0.65 09/29/2016 1000       RADIOGRAPHIC STUDIES: Ct Chest W Contrast  Result Date: 09/28/2016 CLINICAL DATA:  Restaging of right lung adenocarcinoma EXAM: CT CHEST, ABDOMEN, AND PELVIS WITH CONTRAST TECHNIQUE: Multidetector CT imaging of the chest, abdomen and pelvis was performed following the standard  protocol during bolus administration of intravenous contrast. CONTRAST:  15m ISOVUE-300 IOPAMIDOL (ISOVUE-300) INJECTION 61% COMPARISON:  Multiple exams, including 07/27/2016 FINDINGS: CT CHEST FINDINGS Cardiovascular: Aortic arch atherosclerosis.  Mild cardiomegaly. Mediastinum/Nodes: Low-density pretracheal structure 1.2 cm in width on image 24/2, not previously hypermetabolic on PET-CT in thought to probably be a slightly prominent superior pericardial recess rather than a lymph node. The other superior pericardial recesses are also slightly prominent. Lungs/Pleura: Biapical pleuroparenchymal scarring. Small pulmonary nodules in the right upper lobe, including a 4 by 3 mm nodule on image 4/6 which previously measured 3 by 3 mm.  Index right lower lobe lesion associated with bandlike atelectasis, difficult to measure given the configuration and adjacent atelectasis, probably most reliably measured in terms of transverse thickness, currently 1.7 cm on image 89/4, previously 1.5 cm. Musculoskeletal: Stable distribution and appearance of sclerotic metastatic lesions in the bony thorax. CT ABDOMEN PELVIS FINDINGS Hepatobiliary: Stable appearance of multiple small gallstones. No focal liver lesion identified. No biliary dilatation observed. Pancreas: Unremarkable Spleen: Unremarkable Adrenals/Urinary Tract: Non rotated and flattened pelvic left kidney. Right kidney unremarkable. Adrenal glands normal. Stomach/Bowel: Unremarkable Vascular/Lymphatic: Left external iliac node 1.0 cm in short axis on image 104/2, stable. Reproductive: Unremarkable Other: No supplemental non-categorized findings. Musculoskeletal: Stable appearance in distribution of scattered small sclerotic lesions in the lumbar spine, bony pelvis, and left proximal femur. Levoconvex lumbar scoliosis with associated spondylosis and degenerative disc disease. IMPRESSION: 1. The right lower lobe index lesion has adjacent bandlike atelectasis, and is slightly thicker than previous, measuring 1.7 cm in thickness and previously 1.5 cm. This may represent mild interval growth. 2. There is a borderline enlarged left external iliac lymph node. The low-density pretracheal structure in the chest is thought to be a superior pericardial recess. 3. Several tiny right upper lobe pulmonary nodules measuring up to 4 by 3 mm in size, these merit surveillance. 4. Stable distribution and appearance of scattered small focal sclerotic metastatic lesions. No new bony lesions are identified. 5. Stable appearance of non rotated and flattened pelvic left kidney. 6. Cholelithiasis. 7.  Aortic Atherosclerosis (ICD10-I70.0).  Mild cardiomegaly. Electronically Signed   By: WVan ClinesM.D.   On:  09/28/2016 14:07   Ct Abdomen Pelvis W Contrast  Result Date: 09/28/2016 CLINICAL DATA:  Restaging of right lung adenocarcinoma EXAM: CT CHEST, ABDOMEN, AND PELVIS WITH CONTRAST TECHNIQUE: Multidetector CT imaging of the chest, abdomen and pelvis was performed following the standard protocol during bolus administration of intravenous contrast. CONTRAST:  743mISOVUE-300 IOPAMIDOL (ISOVUE-300) INJECTION 61% COMPARISON:  Multiple exams, including 07/27/2016 FINDINGS: CT CHEST FINDINGS Cardiovascular: Aortic arch atherosclerosis.  Mild cardiomegaly. Mediastinum/Nodes: Low-density pretracheal structure 1.2 cm in width on image 24/2, not previously hypermetabolic on PET-CT in thought to probably be a slightly prominent superior pericardial recess rather than a lymph node. The other superior pericardial recesses are also slightly prominent. Lungs/Pleura: Biapical pleuroparenchymal scarring. Small pulmonary nodules in the right upper lobe, including a 4 by 3 mm nodule on image 4/6 which previously measured 3 by 3 mm. Index right lower lobe lesion associated with bandlike atelectasis, difficult to measure given the configuration and adjacent atelectasis, probably most reliably measured in terms of transverse thickness, currently 1.7 cm on image 89/4, previously 1.5 cm. Musculoskeletal: Stable distribution and appearance of sclerotic metastatic lesions in the bony thorax. CT ABDOMEN PELVIS FINDINGS Hepatobiliary: Stable appearance of multiple small gallstones. No focal liver lesion identified. No biliary dilatation observed. Pancreas: Unremarkable Spleen: Unremarkable Adrenals/Urinary Tract: Non rotated and flattened  pelvic left kidney. Right kidney unremarkable. Adrenal glands normal. Stomach/Bowel: Unremarkable Vascular/Lymphatic: Left external iliac node 1.0 cm in short axis on image 104/2, stable. Reproductive: Unremarkable Other: No supplemental non-categorized findings. Musculoskeletal: Stable appearance in  distribution of scattered small sclerotic lesions in the lumbar spine, bony pelvis, and left proximal femur. Levoconvex lumbar scoliosis with associated spondylosis and degenerative disc disease. IMPRESSION: 1. The right lower lobe index lesion has adjacent bandlike atelectasis, and is slightly thicker than previous, measuring 1.7 cm in thickness and previously 1.5 cm. This may represent mild interval growth. 2. There is a borderline enlarged left external iliac lymph node. The low-density pretracheal structure in the chest is thought to be a superior pericardial recess. 3. Several tiny right upper lobe pulmonary nodules measuring up to 4 by 3 mm in size, these merit surveillance. 4. Stable distribution and appearance of scattered small focal sclerotic metastatic lesions. No new bony lesions are identified. 5. Stable appearance of non rotated and flattened pelvic left kidney. 6. Cholelithiasis. 7.  Aortic Atherosclerosis (ICD10-I70.0).  Mild cardiomegaly. Electronically Signed   By: Van Clines M.D.   On: 09/28/2016 14:07    ASSESSMENT AND PLAN:  This is a very pleasant 71 years old Hispanic female with metastatic non-small cell lung cancer, adenocarcinoma status post induction systemic chemotherapy with carbo platinum and Alimta. The patient is currently undergoing maintenance treatment with single agent Alimta status post 9 cycles and has been tolerating the treatment well except for mild fatigue. She had repeat CT scan of the chest, abdomen and pelvis performed recently. I personally and independently reviewed the scan images and discussed the results with the patient and her sister and showed them the images. The scan showed stable disease except for slight increase in right lower lobe bandlike lesion. I recommended for the patient to continue her current treatment with maintenance Alimta. She will proceed with cycle #10 today as scheduled. For nausea she will continue on Zofran as needed. For  the cough she is currently on Tessalon. I will see her back for follow-up visit in 3 weeks for evaluation before starting cycle #11. The patient voices understanding of current disease status and treatment options and is in agreement with the current care plan. All questions were answered. The patient knows to call the clinic with any problems, questions or concerns. We can certainly see the patient much sooner if necessary.  Disclaimer: This note was dictated with voice recognition software. Similar sounding words can inadvertently be transcribed and may not be corrected upon review.

## 2016-09-30 ENCOUNTER — Ambulatory Visit: Payer: Medicare Other

## 2016-09-30 ENCOUNTER — Other Ambulatory Visit: Payer: Medicare Other

## 2016-09-30 ENCOUNTER — Ambulatory Visit: Payer: Medicare Other | Admitting: Internal Medicine

## 2016-10-01 ENCOUNTER — Encounter: Payer: Self-pay | Admitting: Nutrition

## 2016-10-01 NOTE — Progress Notes (Signed)
Patient was requesting healthy diet information during chemotherapy.  I will mail patient fact sheets on plant-based diet, high-protein foods, and diet for nutrition.  Survivorship.

## 2016-10-20 ENCOUNTER — Encounter: Payer: Self-pay | Admitting: Gynecology

## 2016-10-21 ENCOUNTER — Encounter: Payer: Self-pay | Admitting: Internal Medicine

## 2016-10-21 ENCOUNTER — Ambulatory Visit (HOSPITAL_BASED_OUTPATIENT_CLINIC_OR_DEPARTMENT_OTHER): Payer: Medicare Other

## 2016-10-21 ENCOUNTER — Other Ambulatory Visit (HOSPITAL_BASED_OUTPATIENT_CLINIC_OR_DEPARTMENT_OTHER): Payer: Medicare Other

## 2016-10-21 ENCOUNTER — Ambulatory Visit (HOSPITAL_BASED_OUTPATIENT_CLINIC_OR_DEPARTMENT_OTHER): Payer: Medicare Other | Admitting: Internal Medicine

## 2016-10-21 ENCOUNTER — Telehealth: Payer: Self-pay | Admitting: Internal Medicine

## 2016-10-21 VITALS — BP 142/89 | HR 88 | Temp 98.2°F | Resp 20 | Ht 69.0 in | Wt 164.1 lb

## 2016-10-21 DIAGNOSIS — Z5111 Encounter for antineoplastic chemotherapy: Secondary | ICD-10-CM | POA: Diagnosis not present

## 2016-10-21 DIAGNOSIS — R05 Cough: Secondary | ICD-10-CM | POA: Diagnosis not present

## 2016-10-21 DIAGNOSIS — R530 Neoplastic (malignant) related fatigue: Secondary | ICD-10-CM

## 2016-10-21 DIAGNOSIS — C3431 Malignant neoplasm of lower lobe, right bronchus or lung: Secondary | ICD-10-CM | POA: Diagnosis not present

## 2016-10-21 DIAGNOSIS — C3491 Malignant neoplasm of unspecified part of right bronchus or lung: Secondary | ICD-10-CM

## 2016-10-21 DIAGNOSIS — C7951 Secondary malignant neoplasm of bone: Secondary | ICD-10-CM

## 2016-10-21 DIAGNOSIS — T451X5A Adverse effect of antineoplastic and immunosuppressive drugs, initial encounter: Secondary | ICD-10-CM

## 2016-10-21 DIAGNOSIS — D6481 Anemia due to antineoplastic chemotherapy: Secondary | ICD-10-CM

## 2016-10-21 LAB — CBC WITH DIFFERENTIAL/PLATELET
BASO%: 0.2 % (ref 0.0–2.0)
Basophils Absolute: 0 10*3/uL (ref 0.0–0.1)
EOS%: 0 % (ref 0.0–7.0)
Eosinophils Absolute: 0 10*3/uL (ref 0.0–0.5)
HCT: 35.1 % (ref 34.8–46.6)
HGB: 11.6 g/dL (ref 11.6–15.9)
LYMPH%: 12.8 % — ABNORMAL LOW (ref 14.0–49.7)
MCH: 32.3 pg (ref 25.1–34.0)
MCHC: 33 g/dL (ref 31.5–36.0)
MCV: 97.8 fL (ref 79.5–101.0)
MONO#: 0.2 10*3/uL (ref 0.1–0.9)
MONO%: 3.9 % (ref 0.0–14.0)
NEUT#: 4.7 10*3/uL (ref 1.5–6.5)
NEUT%: 83.1 % — ABNORMAL HIGH (ref 38.4–76.8)
Platelets: 234 10*3/uL (ref 145–400)
RBC: 3.59 10*6/uL — ABNORMAL LOW (ref 3.70–5.45)
RDW: 14.6 % — ABNORMAL HIGH (ref 11.2–14.5)
WBC: 5.6 10*3/uL (ref 3.9–10.3)
lymph#: 0.7 10*3/uL — ABNORMAL LOW (ref 0.9–3.3)

## 2016-10-21 LAB — COMPREHENSIVE METABOLIC PANEL
ALT: 33 U/L (ref 0–55)
AST: 38 U/L — ABNORMAL HIGH (ref 5–34)
Albumin: 3.9 g/dL (ref 3.5–5.0)
Alkaline Phosphatase: 61 U/L (ref 40–150)
Anion Gap: 9 mEq/L (ref 3–11)
BUN: 18.6 mg/dL (ref 7.0–26.0)
CO2: 26 mEq/L (ref 22–29)
Calcium: 10.3 mg/dL (ref 8.4–10.4)
Chloride: 104 mEq/L (ref 98–109)
Creatinine: 1.1 mg/dL (ref 0.6–1.1)
EGFR: 49 mL/min/{1.73_m2} — ABNORMAL LOW (ref >90)
Glucose: 118 mg/dl (ref 70–140)
Potassium: 4.4 mEq/L (ref 3.5–5.1)
Sodium: 139 mEq/L (ref 136–145)
Total Bilirubin: 0.67 mg/dL (ref 0.20–1.20)
Total Protein: 7.7 g/dL (ref 6.4–8.3)

## 2016-10-21 MED ORDER — PEMETREXED DISODIUM CHEMO INJECTION 500 MG
525.0000 mg/m2 | Freq: Once | INTRAVENOUS | Status: AC
  Administered 2016-10-21: 1000 mg via INTRAVENOUS
  Filled 2016-10-21: qty 40

## 2016-10-21 MED ORDER — SODIUM CHLORIDE 0.9% FLUSH
10.0000 mL | INTRAVENOUS | Status: DC | PRN
Start: 2016-10-21 — End: 2016-10-21
  Administered 2016-10-21: 10 mL
  Filled 2016-10-21: qty 10

## 2016-10-21 MED ORDER — PROCHLORPERAZINE MALEATE 10 MG PO TABS
10.0000 mg | ORAL_TABLET | Freq: Once | ORAL | Status: AC
  Administered 2016-10-21: 10 mg via ORAL

## 2016-10-21 MED ORDER — PROCHLORPERAZINE MALEATE 10 MG PO TABS
ORAL_TABLET | ORAL | Status: AC
  Filled 2016-10-21: qty 1

## 2016-10-21 MED ORDER — SODIUM CHLORIDE 0.9 % IV SOLN
Freq: Once | INTRAVENOUS | Status: AC
  Administered 2016-10-21: 14:00:00 via INTRAVENOUS

## 2016-10-21 MED ORDER — HEPARIN SOD (PORK) LOCK FLUSH 100 UNIT/ML IV SOLN
500.0000 [IU] | Freq: Once | INTRAVENOUS | Status: AC | PRN
  Administered 2016-10-21: 500 [IU]
  Filled 2016-10-21: qty 5

## 2016-10-21 MED ORDER — ZOLEDRONIC ACID 4 MG/100ML IV SOLN
4.0000 mg | Freq: Once | INTRAVENOUS | Status: AC
  Administered 2016-10-21: 4 mg via INTRAVENOUS
  Filled 2016-10-21: qty 100

## 2016-10-21 NOTE — Telephone Encounter (Signed)
Patient was given a copy of the AVS report and appointment schedule per 10/21/16 los.

## 2016-10-21 NOTE — Patient Instructions (Signed)
[facility] Discharge Instructions for Patients Receiving Chemotherapy  Today you received the following chemotherapy agents; Alimta.   To help prevent nausea and vomiting after your treatment, we encourage you to take your nausea medication as directed.    If you develop nausea and vomiting that is not controlled by your nausea medication, call the clinic.   BELOW ARE SYMPTOMS THAT SHOULD BE REPORTED IMMEDIATELY:  *FEVER GREATER THAN 100.5 F  *CHILLS WITH OR WITHOUT FEVER  NAUSEA AND VOMITING THAT IS NOT CONTROLLED WITH YOUR NAUSEA MEDICATION  *UNUSUAL SHORTNESS OF BREATH  *UNUSUAL BRUISING OR BLEEDING  TENDERNESS IN MOUTH AND THROAT WITH OR WITHOUT PRESENCE OF ULCERS  *URINARY PROBLEMS  *BOWEL PROBLEMS  UNUSUAL RASH Items with * indicate a potential emergency and should be followed up as soon as possible.  Feel free to call the clinic you have any questions or concerns. The clinic phone number is [phone].  Please show the CHEMO ALERT CARD at check-in to the Emergency Department and triage nurse.   

## 2016-10-21 NOTE — Progress Notes (Signed)
[name] [phone]   Fax:([phone]) [place], MD [place] [address] [place] [address]  DIAGNOSIS: Stage IV (T1b, N2, M1b) non-small cell lung cancer, adenocarcinoma diagnosed in March 2017 and presented with right lower lobe lung nodule in addition to mediastinal lymphadenopathy and metastatic bone lesions.  Molecular studies: PDL 1 TPS  <1%.   Foundation One Studies: Positive for ERBB2 [id number]. Negative for EGFR, KRAS, ALK, BRAF, MET, RET and ROS1.  PRIOR THERAPY: Induction systemic chemotherapy with carboplatin for AUC of 5 and Alimta 500 MG/M2 every 3 weeks is status post 6 cycles at the [facility], last dose was given 03/05/2016 with stable disease.  CURRENT THERAPY::  1) Maintenance chemotherapy with Alimta 500 MG/M2 every 3 weeks status post 10 cycles. First dose was given 03/25/2016.  2) Zometa 4 mg IV every 12 week for metastatic bone disease.  INTERVAL HISTORY: Valerie Long 71 y.o. female returns to the clinic today for follow-up visit accompanied by her sister. The patient is feeling fine today with no specific complaints except for mild cough. She denied having any chest pain, shortness of breath, or hemoptysis. She has no weight loss or night sweats. She has no nausea, vomiting, diarrhea or constipation. She denied having any headache or visual changes. She is here today for evaluation before starting cycle #11.  MEDICAL HISTORY: Past Medical History:  Diagnosis Date  . Adenocarcinoma of right lung, stage 4 ([place]) 2017  . Adenocarcinoma of right lung, stage 4 ([place])   . Anemia   . Arthritis   . Encounter for antineoplastic chemotherapy 03/17/2016  . Hypercholesterolemia   . Hypertension 06/18/2016  . Osteopenia   . 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 Pneumonia   . Shortness of breath dyspnea     ALLERGIES:  has No Known Allergies.  MEDICATIONS:    Current Outpatient Prescriptions  Medication Sig Dispense Refill  . acetaminophen (TYLENOL) 500 MG tablet Take 1,000 mg by mouth every 6 (six) hours as needed for moderate pain or headache.    . benzonatate (TESSALON) 100 MG capsule Take 100 mg by mouth 3 (three) times daily as needed. cough    . Calcium Carb-Cholecalciferol (CALCIUM 600/VITAMIN D3 PO) Take by mouth.    . Cyanocobalamin (B-12 PO) Take 2 tablets by mouth daily.    Marland Kitchen dexamethasone (DECADRON) 4 MG tablet Take 4 tablets (16 mg total) by mouth 2 (two) times daily. Take 18m by mouth twice daily the day before, of, and after chemo (Patient taking differently: Take 4 mg by mouth 2 (two) times daily. Take 453mby mouth twice daily the day before, of, and after chemo) 30 tablet 2  . Fish Oil-Cholecalciferol (OMEGA-3 + VITAMIN D3 PO) Take 1 tablet by mouth daily.    . folic acid (FOLVITE) 1 MG tablet Take 1 tablet (1 mg total) by mouth daily. 90 tablet 0  . lidocaine-prilocaine (EMLA) cream Apply 1 application topically as needed. Apply 1-2 tsp over port site 1-2 hours prior to chemo. 30 g 0  . ondansetron (ZOFRAN) 8 MG tablet Take 8 mg by mouth every 8 (eight) hours as needed.    . Marland KitchenRESCRIPTION MEDICATION Inject 1 Dose as directed every 21 ( twenty-one) days. Chemo    . prochlorperazine (COMPAZINE) 10 MG tablet Take 10 mg by mouth every 6 (six) hours as needed for nausea.  2   No current facility-administered medications for this visit.     SURGICAL HISTORY:  Past Surgical History:  Procedure Laterality Date  . CESAREAN SECTION     myomectomy  . COLONOSCOPY W/ POLYPECTOMY    . IR GENERIC HISTORICAL  08/17/2016   IR US GUIDE VASC ACCESS RIGHT 08/17/2016 [name], MD [facility]  . IR GENERIC HISTORICAL  08/17/2016   IR FLUORO GUIDE PORT INSERTION RIGHT 08/17/2016 [name], MD [facility]  . MEDIASTINOSCOPY N/A 09/25/2015   Procedure: MEDIASTINOSCOPY;  Surgeon: Melrose Nakayama, MD;  Location: [place];  Service:  Thoracic;  Laterality: N/A;  . VIDEO BRONCHOSCOPY Bilateral 08/19/2015   Procedure: VIDEO BRONCHOSCOPY WITH FLUORO;  Surgeon: Rigoberto Noel, MD;  Location: [place];  Service: Cardiopulmonary;  Laterality: Bilateral;  . VIDEO BRONCHOSCOPY N/A 09/08/2015   Procedure: VIDEO BRONCHOSCOPY WITH FLUORO;  Surgeon: Rigoberto Noel, MD;  Location: [place];  Service: Thoracic;  Laterality: N/A;  . VIDEO BRONCHOSCOPY WITH ENDOBRONCHIAL ULTRASOUND Right 09/08/2015   Procedure: ATTEMPTED VIDEO BRONCHOSCOPY ENDOBRONCHIAL ULTRASOUND  ;  Surgeon: Rigoberto Noel, MD;  Location: [place];  Service: Thoracic;  Laterality: Right;    REVIEW OF SYSTEMS:  A comprehensive review of systems was negative except for: Respiratory: positive for cough   PHYSICAL EXAMINATION: General appearance: alert, cooperative, fatigued and no distress Head: Normocephalic, without obvious abnormality, atraumatic Neck: no adenopathy, no JVD, supple, symmetrical, trachea midline and thyroid not enlarged, symmetric, no tenderness/mass/nodules Lymph nodes: Cervical, supraclavicular, and axillary nodes normal. Resp: clear to auscultation bilaterally Back: symmetric, no curvature. ROM normal. No CVA tenderness. Cardio: regular rate and rhythm, S1, S2 normal, no murmur, click, rub or gallop GI: soft, non-tender; bowel sounds normal; no masses,  no organomegaly Extremities: extremities normal, atraumatic, no cyanosis or edema  ECOG PERFORMANCE STATUS: 0 - Asymptomatic  Blood pressure (!) 142/89, pulse 88, temperature 98.2 F (36.8 C), temperature source Oral, resp. rate 20, height '5\' 9"'  (1.753 m), weight 164 lb 1.6 oz (74.4 kg), last menstrual period 06/09/1998, SpO2 100 %.  LABORATORY DATA: Lab Results  Component Value Date   WBC 5.6 10/21/2016   HGB 11.6 10/21/2016   HCT 35.1 10/21/2016   MCV 97.8 10/21/2016   PLT 234 10/21/2016      Chemistry      Component Value Date/Time   NA 139 10/21/2016 1115   K 4.4 10/21/2016 1115   CL 107  09/25/2015 0805   CO2 26 10/21/2016 1115   BUN 18.6 10/21/2016 1115   CREATININE 1.1 10/21/2016 1115   GLU 170 01/23/2016      Component Value Date/Time   CALCIUM 10.3 10/21/2016 1115   ALKPHOS 61 10/21/2016 1115   AST 38 (H) 10/21/2016 1115   ALT 33 10/21/2016 1115   BILITOT 0.67 10/21/2016 1115       RADIOGRAPHIC STUDIES: Ct Chest W Contrast  Result Date: 09/28/2016 CLINICAL DATA:  Restaging of right lung adenocarcinoma EXAM: CT CHEST, ABDOMEN, AND PELVIS WITH CONTRAST TECHNIQUE: Multidetector CT imaging of the chest, abdomen and pelvis was performed following the standard protocol during bolus administration of intravenous contrast. CONTRAST:  32m ISOVUE-300 IOPAMIDOL (ISOVUE-300) INJECTION 61% COMPARISON:  Multiple exams, including 07/27/2016 FINDINGS: CT CHEST FINDINGS Cardiovascular: Aortic arch atherosclerosis.  Mild cardiomegaly. Mediastinum/Nodes: Low-density pretracheal structure 1.2 cm in width on image 24/2, not previously hypermetabolic on PET-CT in thought to probably be a slightly prominent superior pericardial recess rather than a lymph node. The other superior pericardial recesses are  also slightly prominent. Lungs/Pleura: Biapical pleuroparenchymal scarring. Small pulmonary nodules in the right upper lobe, including a 4 by 3 mm nodule on image 4/6 which previously measured 3 by 3 mm. Index right lower lobe lesion associated with bandlike atelectasis, difficult to measure given the configuration and adjacent atelectasis, probably most reliably measured in terms of transverse thickness, currently 1.7 cm on image 89/4, previously 1.5 cm. Musculoskeletal: Stable distribution and appearance of sclerotic metastatic lesions in the bony thorax. CT ABDOMEN PELVIS FINDINGS Hepatobiliary: Stable appearance of multiple small gallstones. No focal liver lesion identified. No biliary dilatation observed. Pancreas: Unremarkable Spleen: Unremarkable Adrenals/Urinary Tract: Non rotated and  flattened pelvic left kidney. Right kidney unremarkable. Adrenal glands normal. Stomach/Bowel: Unremarkable Vascular/Lymphatic: Left external iliac node 1.0 cm in short axis on image 104/2, stable. Reproductive: Unremarkable Other: No supplemental non-categorized findings. Musculoskeletal: Stable appearance in distribution of scattered small sclerotic lesions in the lumbar spine, bony pelvis, and left proximal femur. Levoconvex lumbar scoliosis with associated spondylosis and degenerative disc disease. IMPRESSION: 1. The right lower lobe index lesion has adjacent bandlike atelectasis, and is slightly thicker than previous, measuring 1.7 cm in thickness and previously 1.5 cm. This may represent mild interval growth. 2. There is a borderline enlarged left external iliac lymph node. The low-density pretracheal structure in the chest is thought to be a superior pericardial recess. 3. Several tiny right upper lobe pulmonary nodules measuring up to 4 by 3 mm in size, these merit surveillance. 4. Stable distribution and appearance of scattered small focal sclerotic metastatic lesions. No new bony lesions are identified. 5. Stable appearance of non rotated and flattened pelvic left kidney. 6. Cholelithiasis. 7.  Aortic Atherosclerosis (ICD10-I70.0).  Mild cardiomegaly. Electronically Signed   By: Van Clines M.D.   On: 09/28/2016 14:07   Ct Abdomen Pelvis W Contrast  Result Date: 09/28/2016 CLINICAL DATA:  Restaging of right lung adenocarcinoma EXAM: CT CHEST, ABDOMEN, AND PELVIS WITH CONTRAST TECHNIQUE: Multidetector CT imaging of the chest, abdomen and pelvis was performed following the standard protocol during bolus administration of intravenous contrast. CONTRAST:  62m ISOVUE-300 IOPAMIDOL (ISOVUE-300) INJECTION 61% COMPARISON:  Multiple exams, including 07/27/2016 FINDINGS: CT CHEST FINDINGS Cardiovascular: Aortic arch atherosclerosis.  Mild cardiomegaly. Mediastinum/Nodes: Low-density pretracheal structure  1.2 cm in width on image 24/2, not previously hypermetabolic on PET-CT in thought to probably be a slightly prominent superior pericardial recess rather than a lymph node. The other superior pericardial recesses are also slightly prominent. Lungs/Pleura: Biapical pleuroparenchymal scarring. Small pulmonary nodules in the right upper lobe, including a 4 by 3 mm nodule on image 4/6 which previously measured 3 by 3 mm. Index right lower lobe lesion associated with bandlike atelectasis, difficult to measure given the configuration and adjacent atelectasis, probably most reliably measured in terms of transverse thickness, currently 1.7 cm on image 89/4, previously 1.5 cm. Musculoskeletal: Stable distribution and appearance of sclerotic metastatic lesions in the bony thorax. CT ABDOMEN PELVIS FINDINGS Hepatobiliary: Stable appearance of multiple small gallstones. No focal liver lesion identified. No biliary dilatation observed. Pancreas: Unremarkable Spleen: Unremarkable Adrenals/Urinary Tract: Non rotated and flattened pelvic left kidney. Right kidney unremarkable. Adrenal glands normal. Stomach/Bowel: Unremarkable Vascular/Lymphatic: Left external iliac node 1.0 cm in short axis on image 104/2, stable. Reproductive: Unremarkable Other: No supplemental non-categorized findings. Musculoskeletal: Stable appearance in distribution of scattered small sclerotic lesions in the lumbar spine, bony pelvis, and left proximal femur. Levoconvex lumbar scoliosis with associated spondylosis and degenerative disc disease. IMPRESSION: 1. The right lower lobe index lesion  has adjacent bandlike atelectasis, and is slightly thicker than previous, measuring 1.7 cm in thickness and previously 1.5 cm. This may represent mild interval growth. 2. There is a borderline enlarged left external iliac lymph node. The low-density pretracheal structure in the chest is thought to be a superior pericardial recess. 3. Several tiny right upper lobe  pulmonary nodules measuring up to 4 by 3 mm in size, these merit surveillance. 4. Stable distribution and appearance of scattered small focal sclerotic metastatic lesions. No new bony lesions are identified. 5. Stable appearance of non rotated and flattened pelvic left kidney. 6. Cholelithiasis. 7.  Aortic Atherosclerosis (ICD10-I70.0).  Mild cardiomegaly. Electronically Signed   By: Van Clines M.D.   On: 09/28/2016 14:07    ASSESSMENT AND PLAN:  This is a very pleasant 71 years old Hispanic female with metastatic non-small cell lung cancer, adenocarcinoma status post induction systemic chemotherapy with carboplatin and Alimta. She is currently undergoing maintenance treatment with Alimta 500 MG/M2 every 3 weeks status post 10 cycles. She is tolerating her treatment well except for occasional fatigue. I recommended for the patient to proceed with cycle #11 today as a scheduled. I will see her back for follow-up visit in 3 weeks for evaluation with the start of cycle #12. She was advised to call immediately if she has any concerning symptoms in the interval. The patient voices understanding of current disease status and treatment options and is in agreement with the current care plan. All questions were answered. The patient knows to call the clinic with any problems, questions or concerns. We can certainly see the patient much sooner if necessary. I spent 10 minutes counseling the patient face to face. The total time spent in the appointment was 15 minutes.  Disclaimer: This note was dictated with voice recognition software. Similar sounding words can inadvertently be transcribed and may not be corrected upon review.

## 2016-11-10 ENCOUNTER — Ambulatory Visit: Payer: Medicare Other

## 2016-11-10 ENCOUNTER — Ambulatory Visit: Payer: Medicare Other | Admitting: Internal Medicine

## 2016-11-10 ENCOUNTER — Other Ambulatory Visit: Payer: Medicare Other

## 2016-11-11 ENCOUNTER — Ambulatory Visit (HOSPITAL_BASED_OUTPATIENT_CLINIC_OR_DEPARTMENT_OTHER): Payer: Medicare Other

## 2016-11-11 ENCOUNTER — Encounter: Payer: Self-pay | Admitting: Internal Medicine

## 2016-11-11 ENCOUNTER — Other Ambulatory Visit (HOSPITAL_BASED_OUTPATIENT_CLINIC_OR_DEPARTMENT_OTHER): Payer: Medicare Other

## 2016-11-11 ENCOUNTER — Ambulatory Visit (HOSPITAL_BASED_OUTPATIENT_CLINIC_OR_DEPARTMENT_OTHER): Payer: Medicare Other | Admitting: Internal Medicine

## 2016-11-11 ENCOUNTER — Telehealth: Payer: Self-pay | Admitting: Internal Medicine

## 2016-11-11 VITALS — BP 150/88

## 2016-11-11 VITALS — BP 157/84 | HR 68 | Temp 97.7°F | Resp 19 | Ht 69.0 in | Wt 164.1 lb

## 2016-11-11 DIAGNOSIS — Z5111 Encounter for antineoplastic chemotherapy: Secondary | ICD-10-CM

## 2016-11-11 DIAGNOSIS — C3491 Malignant neoplasm of unspecified part of right bronchus or lung: Secondary | ICD-10-CM

## 2016-11-11 DIAGNOSIS — C3431 Malignant neoplasm of lower lobe, right bronchus or lung: Secondary | ICD-10-CM

## 2016-11-11 DIAGNOSIS — C7951 Secondary malignant neoplasm of bone: Secondary | ICD-10-CM

## 2016-11-11 DIAGNOSIS — D6481 Anemia due to antineoplastic chemotherapy: Secondary | ICD-10-CM

## 2016-11-11 DIAGNOSIS — T451X5A Adverse effect of antineoplastic and immunosuppressive drugs, initial encounter: Secondary | ICD-10-CM

## 2016-11-11 LAB — CBC WITH DIFFERENTIAL/PLATELET
BASO%: 0.5 % (ref 0.0–2.0)
Basophils Absolute: 0 10*3/uL (ref 0.0–0.1)
EOS%: 0.1 % (ref 0.0–7.0)
Eosinophils Absolute: 0 10*3/uL (ref 0.0–0.5)
HCT: 32.5 % — ABNORMAL LOW (ref 34.8–46.6)
HGB: 11.1 g/dL — ABNORMAL LOW (ref 11.6–15.9)
LYMPH%: 19.4 % (ref 14.0–49.7)
MCH: 33.1 pg (ref 25.1–34.0)
MCHC: 34.2 g/dL (ref 31.5–36.0)
MCV: 96.7 fL (ref 79.5–101.0)
MONO#: 0.8 10*3/uL (ref 0.1–0.9)
MONO%: 11.9 % (ref 0.0–14.0)
NEUT#: 4.5 10*3/uL (ref 1.5–6.5)
NEUT%: 68.1 % (ref 38.4–76.8)
Platelets: 239 10*3/uL (ref 145–400)
RBC: 3.36 10*6/uL — ABNORMAL LOW (ref 3.70–5.45)
RDW: 15 % — ABNORMAL HIGH (ref 11.2–14.5)
WBC: 6.7 10*3/uL (ref 3.9–10.3)
lymph#: 1.3 10*3/uL (ref 0.9–3.3)

## 2016-11-11 LAB — COMPREHENSIVE METABOLIC PANEL
ALT: 28 U/L (ref 0–55)
AST: 37 U/L — ABNORMAL HIGH (ref 5–34)
Albumin: 3.7 g/dL (ref 3.5–5.0)
Alkaline Phosphatase: 60 U/L (ref 40–150)
Anion Gap: 8 mEq/L (ref 3–11)
BUN: 19.6 mg/dL (ref 7.0–26.0)
CO2: 28 mEq/L (ref 22–29)
Calcium: 10 mg/dL (ref 8.4–10.4)
Chloride: 105 mEq/L (ref 98–109)
Creatinine: 1.2 mg/dL — ABNORMAL HIGH (ref 0.6–1.1)
EGFR: 48 mL/min/{1.73_m2} — ABNORMAL LOW (ref >90)
Glucose: 88 mg/dl (ref 70–140)
Potassium: 4.9 mEq/L (ref 3.5–5.1)
Sodium: 141 mEq/L (ref 136–145)
Total Bilirubin: 0.63 mg/dL (ref 0.20–1.20)
Total Protein: 7.1 g/dL (ref 6.4–8.3)

## 2016-11-11 MED ORDER — PROCHLORPERAZINE MALEATE 10 MG PO TABS
10.0000 mg | ORAL_TABLET | Freq: Once | ORAL | Status: AC
  Administered 2016-11-11: 10 mg via ORAL

## 2016-11-11 MED ORDER — SODIUM CHLORIDE 0.9 % IV SOLN
525.0000 mg/m2 | Freq: Once | INTRAVENOUS | Status: AC
  Administered 2016-11-11: 1000 mg via INTRAVENOUS
  Filled 2016-11-11: qty 40

## 2016-11-11 MED ORDER — PROCHLORPERAZINE MALEATE 10 MG PO TABS
ORAL_TABLET | ORAL | Status: AC
  Filled 2016-11-11: qty 1

## 2016-11-11 MED ORDER — CYANOCOBALAMIN 1000 MCG/ML IJ SOLN
INTRAMUSCULAR | Status: AC
  Filled 2016-11-11: qty 1

## 2016-11-11 MED ORDER — HEPARIN SOD (PORK) LOCK FLUSH 100 UNIT/ML IV SOLN
500.0000 [IU] | Freq: Once | INTRAVENOUS | Status: AC | PRN
  Administered 2016-11-11: 500 [IU]
  Filled 2016-11-11: qty 5

## 2016-11-11 MED ORDER — SODIUM CHLORIDE 0.9 % IV SOLN
Freq: Once | INTRAVENOUS | Status: AC
  Administered 2016-11-11: 13:00:00 via INTRAVENOUS

## 2016-11-11 MED ORDER — CYANOCOBALAMIN 1000 MCG/ML IJ SOLN
1000.0000 ug | Freq: Once | INTRAMUSCULAR | Status: AC
  Administered 2016-11-11: 1000 ug via INTRAMUSCULAR

## 2016-11-11 MED ORDER — SODIUM CHLORIDE 0.9% FLUSH
10.0000 mL | INTRAVENOUS | Status: DC | PRN
  Administered 2016-11-11: 10 mL
  Filled 2016-11-11: qty 10

## 2016-11-11 NOTE — Patient Instructions (Signed)
[facility] Discharge Instructions for Patients Receiving Chemotherapy  Today you received the following chemotherapy agents; Alimta.   To help prevent nausea and vomiting after your treatment, we encourage you to take your nausea medication as directed.    If you develop nausea and vomiting that is not controlled by your nausea medication, call the clinic.   BELOW ARE SYMPTOMS THAT SHOULD BE REPORTED IMMEDIATELY:  *FEVER GREATER THAN 100.5 F  *CHILLS WITH OR WITHOUT FEVER  NAUSEA AND VOMITING THAT IS NOT CONTROLLED WITH YOUR NAUSEA MEDICATION  *UNUSUAL SHORTNESS OF BREATH  *UNUSUAL BRUISING OR BLEEDING  TENDERNESS IN MOUTH AND THROAT WITH OR WITHOUT PRESENCE OF ULCERS  *URINARY PROBLEMS  *BOWEL PROBLEMS  UNUSUAL RASH Items with * indicate a potential emergency and should be followed up as soon as possible.  Feel free to call the clinic you have any questions or concerns. The clinic phone number is [phone].  Please show the CHEMO ALERT CARD at check-in to the Emergency Department and triage nurse.   

## 2016-11-11 NOTE — Progress Notes (Signed)
[facility] [phone]   Fax:(336) [place], MD [place] [address] [place] [address]  DIAGNOSIS: Stage IV (T1b, N2, M1b) non-small cell lung cancer, adenocarcinoma diagnosed in March 2017 and presented with right lower lobe lung nodule in addition to mediastinal lymphadenopathy and metastatic bone lesions.  Molecular studies: PDL 1 TPS  <1%.   Foundation One Studies: Positive for ERBB2 [id number]. Negative for EGFR, KRAS, ALK, BRAF, MET, RET and ROS1.  PRIOR THERAPY: Induction systemic chemotherapy with carboplatin for AUC of 5 and Alimta 500 MG/M2 every 3 weeks is status post 6 cycles at the [facility], last dose was given 03/05/2016 with stable disease.  CURRENT THERAPY::  1) Maintenance chemotherapy with Alimta 500 MG/M2 every 3 weeks status post 11 cycles. First dose was given 03/25/2016.  2) Zometa 4 mg IV every 12 week for metastatic bone disease.  INTERVAL HISTORY: Valerie Long 71 y.o. female returns to the clinic today for follow-up visit accompanied by her sister. The patient is feeling well today with no specific complaints except for mild pain in the buttock area bilaterally. She denied having any chest pain, shortness of breath, cough or hemoptysis. She denied having any nausea, vomiting, diarrhea or constipation. She has no weight loss or night sweats. She is tolerating her treatment with Alimta fairly well. She is here for evaluation before starting cycle #12.   MEDICAL HISTORY: Past Medical History:  Diagnosis Date  . Adenocarcinoma of right lung, stage 4 ([place]) 2017  . Adenocarcinoma of right lung, stage 4 ([place])   . Anemia   . Arthritis   . Encounter for antineoplastic chemotherapy 03/17/2016  . Hypercholesterolemia   . Hypertension 06/18/2016  . Osteopenia   . 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 Pneumonia   . Shortness of breath dyspnea      ALLERGIES:  has No Known Allergies.  MEDICATIONS:  Current Outpatient Prescriptions  Medication Sig Dispense Refill  . Calcium Carb-Cholecalciferol (CALCIUM 600/VITAMIN D3 PO) Take by mouth.    . Cyanocobalamin (B-12 PO) Take 2 tablets by mouth daily.    Marland Kitchen dexamethasone (DECADRON) 4 MG tablet Take 4 tablets (16 mg total) by mouth 2 (two) times daily. Take 60m by mouth twice daily the day before, of, and after chemo (Patient taking differently: Take 4 mg by mouth 2 (two) times daily. Take 424mby mouth twice daily the day before, of, and after chemo) 30 tablet 2  . Fish Oil-Cholecalciferol (OMEGA-3 + VITAMIN D3 PO) Take 1 tablet by mouth daily.    . folic acid (FOLVITE) 1 MG tablet Take 1 tablet (1 mg total) by mouth daily. 90 tablet 0  . lidocaine-prilocaine (EMLA) cream Apply 1 application topically as needed. Apply 1-2 tsp over port site 1-2 hours prior to chemo. 30 g 0  . acetaminophen (TYLENOL) 500 MG tablet Take 1,000 mg by mouth every 6 (six) hours as needed for moderate pain or headache.    . benzonatate (TESSALON) 100 MG capsule Take 100 mg by mouth 3 (three) times daily as needed. cough    . ondansetron (ZOFRAN) 8 MG tablet Take 8 mg by mouth every 8 (eight) hours as needed.    . Marland KitchenRESCRIPTION MEDICATION Inject 1 Dose as directed every 21 ( twenty-one) days. Chemo    . prochlorperazine (COMPAZINE) 10 MG tablet Take 10 mg by mouth every 6 (six)  hours as needed for nausea.   2   No current facility-administered medications for this visit.     SURGICAL HISTORY:  Past Surgical History:  Procedure Laterality Date  . CESAREAN SECTION     myomectomy  . COLONOSCOPY W/ POLYPECTOMY    . IR GENERIC HISTORICAL  08/17/2016   IR US GUIDE VASC ACCESS RIGHT 08/17/2016 [name], MD [facility]  . IR GENERIC HISTORICAL  08/17/2016   IR FLUORO GUIDE PORT INSERTION RIGHT 08/17/2016 [name], MD [facility]  . MEDIASTINOSCOPY N/A 09/25/2015   Procedure: MEDIASTINOSCOPY;   Surgeon: Melrose Nakayama, MD;  Location: [place];  Service: Thoracic;  Laterality: N/A;  . VIDEO BRONCHOSCOPY Bilateral 08/19/2015   Procedure: VIDEO BRONCHOSCOPY WITH FLUORO;  Surgeon: Rigoberto Noel, MD;  Location: [facility];  Service: Cardiopulmonary;  Laterality: Bilateral;  . VIDEO BRONCHOSCOPY N/A 09/08/2015   Procedure: VIDEO BRONCHOSCOPY WITH FLUORO;  Surgeon: Rigoberto Noel, MD;  Location: [facility];  Service: Thoracic;  Laterality: N/A;  . VIDEO BRONCHOSCOPY WITH ENDOBRONCHIAL ULTRASOUND Right 09/08/2015   Procedure: ATTEMPTED VIDEO BRONCHOSCOPY ENDOBRONCHIAL ULTRASOUND  ;  Surgeon: Rigoberto Noel, MD;  Location: [place];  Service: Thoracic;  Laterality: Right;    REVIEW OF SYSTEMS:  A comprehensive review of systems was negative except for: Musculoskeletal: positive for arthralgias   PHYSICAL EXAMINATION: General appearance: alert, cooperative and no distress Head: Normocephalic, without obvious abnormality, atraumatic Neck: no adenopathy, no JVD, supple, symmetrical, trachea midline and thyroid not enlarged, symmetric, no tenderness/mass/nodules Lymph nodes: Cervical, supraclavicular, and axillary nodes normal. Resp: clear to auscultation bilaterally Back: symmetric, no curvature. ROM normal. No CVA tenderness. Cardio: regular rate and rhythm, S1, S2 normal, no murmur, click, rub or gallop GI: soft, non-tender; bowel sounds normal; no masses,  no organomegaly Extremities: extremities normal, atraumatic, no cyanosis or edema  ECOG PERFORMANCE STATUS: 0 - Asymptomatic  Blood pressure (!) 157/84, pulse 68, temperature 97.7 F (36.5 C), temperature source Oral, resp. rate 19, height _0  (1.753 m), weight 164 lb 1.6 oz (74.4 kg), last menstrual period 06/09/1998, SpO2 100 %.  LABORATORY DATA: Lab Results  Component Value Date   WBC 6.7 11/11/2016   HGB 11.1 (L) 11/11/2016   HCT 32.5 (L) 11/11/2016   MCV 96.7 11/11/2016   PLT 239 11/11/2016      Chemistry      Component Value  Date/Time   NA 141 11/11/2016 1102   K 4.9 11/11/2016 1102   CL 107 09/25/2015 0805   CO2 28 11/11/2016 1102   BUN 19.6 11/11/2016 1102   CREATININE 1.2 (H) 11/11/2016 1102   GLU 170 01/23/2016      Component Value Date/Time   CALCIUM 10.0 11/11/2016 1102   ALKPHOS 60 11/11/2016 1102   AST 37 (H) 11/11/2016 1102   ALT 28 11/11/2016 1102   BILITOT 0.63 11/11/2016 1102       RADIOGRAPHIC STUDIES: No results found.  ASSESSMENT AND PLAN:  This is a very pleasant 71 years old Hispanic female with metastatic non-small cell lung cancer, adenocarcinoma status post induction systemic chemotherapy with carboplatin and Alimta and she is currently on maintenance treatment with single agent Alimta status post 11 cycles. She has been tolerating her treatment well with no significant adverse effects. I recommended for the patient to proceed with cycle #12 today as scheduled. I will see her back for follow-up visit in 3 weeks for evaluation with repeat CT scan of the chest, abdomen and pelvis for evaluation of  her disease and also to rule out any abnormality in the buttock area. For hypertension, she was advised to monitor her blood pressure closely at home and to report to her primary care physician if stayed elevated. The patient was advised to call immediately if she has any concerning symptoms in the interval. The patient voices understanding of current disease status and treatment options and is in agreement with the current care plan. All questions were answered. The patient knows to call the clinic with any problems, questions or concerns. We can certainly see the patient much sooner if necessary. I spent 10 minutes counseling the patient face to face. The total time spent in the appointment was 15 minutes.  Disclaimer: This note was dictated with voice recognition software. Similar sounding words can inadvertently be transcribed and may not be corrected upon review.

## 2016-11-11 NOTE — Telephone Encounter (Signed)
Scheduled appt per 6/7 los - gave patient AVS and calender . [facility] to contact patient with ct schedule.

## 2016-11-26 ENCOUNTER — Encounter: Payer: Self-pay | Admitting: Gynecology

## 2016-11-26 ENCOUNTER — Ambulatory Visit (INDEPENDENT_AMBULATORY_CARE_PROVIDER_SITE_OTHER): Payer: Medicare Other | Admitting: Gynecology

## 2016-11-26 VITALS — BP 120/80 | Wt 159.0 lb

## 2016-11-26 DIAGNOSIS — Z01419 Encounter for gynecological examination (general) (routine) without abnormal findings: Secondary | ICD-10-CM | POA: Diagnosis not present

## 2016-11-26 DIAGNOSIS — M8580 Other specified disorders of bone density and structure, unspecified site: Secondary | ICD-10-CM

## 2016-11-26 DIAGNOSIS — Z780 Asymptomatic menopausal state: Secondary | ICD-10-CM

## 2016-11-26 DIAGNOSIS — Z85118 Personal history of other malignant neoplasm of bronchus and lung: Secondary | ICD-10-CM

## 2016-11-26 NOTE — Patient Instructions (Addendum)
Densitometra sea (Bone Densitometry) La densitometra sea es un estudio de diagnstico por imgenes en el que se utiliza una radiografa especial que mide la cantidad de calcio y otros minerales en los huesos (densidad sea). Este estudio tambin se conoce como examen de densidad mineral sea o radioabsorciometra de doble energa (DEXA). Puede medir la densidad sea en la cadera y la columna. Es similar a una radiografa comn. Tambin pueden hacerle este estudio para:  Diagnosticar una enfermedad que causa huesos dbiles o delgados (osteoporosis).  Predecir el riesgo de un hueso roto (fractura).  Determinar si el tratamiento para la osteoporosis funciona. INFORME A SU MDICO:  Cualquier alergia que tenga.  Todos los medicamentos que utiliza, incluidos vitaminas, hierbas, gotas oftlmicas, cremas y medicamentos de venta libre.  Problemas previos que usted o los miembros de su familia hayan tenido con el uso de anestsicos.  Enfermedades de la sangre que tenga.  Si tiene cirugas previas.  Enfermedades que tenga.  Probabilidad de embarazo.  Cualquier otro estudio mdico al que se haya sometido en los ltimos 14 das en el que se haya utilizado material de contraste. RIESGOS Y COMPLICACIONES En general, se trata de un procedimiento seguro. Sin embargo, pueden ocurrir algunos problemas, entre los que se pueden incluir los siguientes:  Este estudio lo expone a una cantidad muy pequea de radiacin.  Los riesgos de la exposicin a la radiacin pueden ser mayores para los nios por nacer. ANTES DEL PROCEDIMIENTO  No tome ningn suplemento de calcio durante 24 horas antes de realizarse el estudio. Puede comer y beber como lo hace habitualmente.  Qutese todas las joyas de metal, anteojos, aparatos dentales y cualquier otro objeto metlico. PROCEDIMIENTO  Deber recostarse en una camilla. Un generador de rayos X estar ubicado debajo de usted y un dispositivo de imgenes, por  encima.  Se pueden usar otros dispositivos, como cajas o abrazaderas, para posicionar el cuerpo apropiadamente para la exploracin.  Deber permanecer inmvil mientras la mquina explore lentamente su cuerpo.  Las imgenes se muestran en el monitor de una computadora. DESPUS DEL PROCEDIMIENTO Es posible que necesite estudios adicionales ms adelante. Esta informacin no tiene como fin reemplazar el consejo del mdico. Asegrese de hacerle al mdico cualquier pregunta que tenga. Document Released: 02/16/2012 Document Revised: 06/14/2014 Document Reviewed: 11/01/2013 Elsevier Interactive Patient Education  2018 Elsevier Inc. With no complaints today. Patient was diagnosed with lung cancer in 2017 

## 2016-11-26 NOTE — Progress Notes (Signed)
Valerie Long [birth year] [id number]   History:    71 y.o.  for annual gyn exam is asymptomatic today. Patient was diagnosed with lung cancer in 2017 is currently undergoing chemotherapy with Dr. Lorna Few. Her PCP is Dr. Larose Kells within doing her blood work. Patient has not had a gynecological exam since 2015. Review of her record indicated that her last bone density study in January 2012 with the lowest T score at the left femoral neck -1.2 with normal FRAX analysis. Patient had a colonoscopy in 2013 whereby she stated that benign polyps were removed. Patient denies any past history of abnormal Pap smear.  Past medical history,surgical history, family history and social history were all reviewed and documented in the EPIC chart.  Gynecologic History Patient's last menstrual period was 06/09/1998. Contraception: post menopausal status Last Pap: 2011. Results were: normal Last mammogram: 2018. Results were: Right breast cyst no evidence of malignancy reported  Obstetric History OB History  Gravida Para Term Preterm AB Living  1 0     1 0  SAB TAB Ectopic Multiple Live Births  1            # Outcome Date GA Lbr Len/2nd Weight Sex Delivery Anes PTL Lv  1 SAB                ROS: A ROS was performed and pertinent positives and negatives are included in the history.  GENERAL: No fevers or chills. HEENT: No change in vision, no earache, sore throat or sinus congestion. NECK: No pain or stiffness. CARDIOVASCULAR: No chest pain or pressure. No palpitations. PULMONARY: No shortness of breath, cough or wheeze. GASTROINTESTINAL: No abdominal pain, nausea, vomiting or diarrhea, melena or bright red blood per rectum. GENITOURINARY: No urinary frequency, urgency, hesitancy or dysuria. MUSCULOSKELETAL: No joint or muscle pain, no back pain, no recent trauma. DERMATOLOGIC: No rash, no itching, no lesions. ENDOCRINE: No polyuria, polydipsia, no heat or cold intolerance. No recent change in weight.  HEMATOLOGICAL: No anemia or easy bruising or bleeding. NEUROLOGIC: No headache, seizures, numbness, tingling or weakness. PSYCHIATRIC: No depression, no loss of interest in normal activity or change in sleep pattern.     Exam: chaperone present  BP 120/80   Wt 159 lb (72.1 kg)   LMP 06/09/1998   BMI 23.48 kg/m   Body mass index is 23.48 kg/m.  General appearance : Well developed well nourished female. No acute distress HEENT: Eyes: no retinal hemorrhage or exudates,  Neck supple, trachea midline, no carotid bruits, no thyroidmegaly Lungs: Clear to auscultation, no rhonchi or wheezes, or rib retractions  Heart: Regular rate and rhythm, no murmurs or gallops Breast:Examined in sitting and supine position were symmetrical in appearance, no palpable masses or tenderness,  no skin retraction, no nipple inversion, no nipple discharge, no skin discoloration, no axillary or supraclavicular lymphadenopathy Abdomen: no palpable masses or tenderness, no rebound or guarding Extremities: no edema or skin discoloration or tenderness  Pelvic:  Bartholin, Urethra, Skene Glands: Within normal limits             Vagina: No gross lesions or discharge  Cervix: No gross lesions or discharge  Uterus  anteverted, normal size, shape and consistency, non-tender and mobile  Adnexa  Without masses or tenderness  Anus and perineum  normal   Rectovaginal  normal sphincter tone without palpated masses or tenderness             Hemoccult will have colonoscopy possible views of the  year     Assessment/Plan:  71 y.o. female for annual exam currently undergoing chemotherapy for lung cancer. Patient to schedule bone density study here in the office in the next few weeks. Recommendations to wait on her colonoscopy after chemotherapy is completed. 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 We discussed importance of calcium vitamin D and  weightbearing exercise for osteoporosis prevention. PCP is been doing her blood work.   Terrance Mass MD, 12:40 PM 11/26/2016

## 2016-11-30 ENCOUNTER — Telehealth: Payer: Self-pay

## 2016-11-30 DIAGNOSIS — C3490 Malignant neoplasm of unspecified part of unspecified bronchus or lung: Secondary | ICD-10-CM

## 2016-11-30 LAB — PAP, TP IMAGING W/ HPV RNA, RFLX HPV TYPE 16,18/45: HPV mRNA, High Risk: NOT DETECTED

## 2016-11-30 MED ORDER — FOLIC ACID 1 MG PO TABS: 1.0000 mg | ORAL_TABLET | Freq: Every day | ORAL | 0 refills | Status: DC

## 2016-11-30 NOTE — Telephone Encounter (Signed)
Pt called for folic acid refill. Done.

## 2016-12-01 ENCOUNTER — Other Ambulatory Visit: Payer: Self-pay | Admitting: Medical Oncology

## 2016-12-01 ENCOUNTER — Encounter (HOSPITAL_COMMUNITY): Payer: Self-pay

## 2016-12-01 ENCOUNTER — Ambulatory Visit (HOSPITAL_COMMUNITY)
Admission: RE | Admit: 2016-12-01 | Discharge: 2016-12-01 | Disposition: A | Discharge status: Home / Self Care | Payer: Medicare Other | Source: Ambulatory Visit | Attending: Internal Medicine | Admitting: Internal Medicine

## 2016-12-01 DIAGNOSIS — Z5111 Encounter for antineoplastic chemotherapy: Secondary | ICD-10-CM | POA: Diagnosis not present

## 2016-12-01 DIAGNOSIS — C3491 Malignant neoplasm of unspecified part of right bronchus or lung: Secondary | ICD-10-CM

## 2016-12-01 DIAGNOSIS — M47896 Other spondylosis, lumbar region: Secondary | ICD-10-CM | POA: Insufficient documentation

## 2016-12-01 DIAGNOSIS — M5136 Other intervertebral disc degeneration, lumbar region: Secondary | ICD-10-CM | POA: Insufficient documentation

## 2016-12-01 DIAGNOSIS — T451X5A Adverse effect of antineoplastic and immunosuppressive drugs, initial encounter: Secondary | ICD-10-CM | POA: Insufficient documentation

## 2016-12-01 DIAGNOSIS — I700 Atherosclerosis of aorta: Secondary | ICD-10-CM | POA: Diagnosis not present

## 2016-12-01 DIAGNOSIS — M4186 Other forms of scoliosis, lumbar region: Secondary | ICD-10-CM | POA: Insufficient documentation

## 2016-12-01 DIAGNOSIS — D6481 Anemia due to antineoplastic chemotherapy: Secondary | ICD-10-CM

## 2016-12-01 DIAGNOSIS — I517 Cardiomegaly: Secondary | ICD-10-CM | POA: Insufficient documentation

## 2016-12-01 DIAGNOSIS — K8020 Calculus of gallbladder without cholecystitis without obstruction: Secondary | ICD-10-CM | POA: Diagnosis not present

## 2016-12-01 DIAGNOSIS — C7951 Secondary malignant neoplasm of bone: Secondary | ICD-10-CM | POA: Diagnosis not present

## 2016-12-01 DIAGNOSIS — C3410 Malignant neoplasm of upper lobe, unspecified bronchus or lung: Secondary | ICD-10-CM | POA: Diagnosis not present

## 2016-12-01 MED ORDER — IOPAMIDOL (ISOVUE-300) INJECTION 61%
100.0000 mL | Freq: Once | INTRAVENOUS | Status: AC | PRN
  Administered 2016-12-01: 80 mL via INTRAVENOUS

## 2016-12-01 MED ORDER — IOPAMIDOL (ISOVUE-300) INJECTION 61%
INTRAVENOUS | Status: AC
  Administered 2016-12-01: 80 mL via INTRAVENOUS
  Filled 2016-12-01: qty 100

## 2016-12-02 ENCOUNTER — Ambulatory Visit (HOSPITAL_BASED_OUTPATIENT_CLINIC_OR_DEPARTMENT_OTHER): Payer: Medicare Other

## 2016-12-02 ENCOUNTER — Encounter: Payer: Self-pay | Admitting: Adult Health

## 2016-12-02 ENCOUNTER — Other Ambulatory Visit (HOSPITAL_BASED_OUTPATIENT_CLINIC_OR_DEPARTMENT_OTHER): Payer: Medicare Other

## 2016-12-02 ENCOUNTER — Ambulatory Visit (HOSPITAL_BASED_OUTPATIENT_CLINIC_OR_DEPARTMENT_OTHER): Payer: Medicare Other | Admitting: Adult Health

## 2016-12-02 VITALS — BP 148/74 | HR 78 | Temp 97.8°F | Resp 18 | Ht 69.0 in | Wt 162.2 lb

## 2016-12-02 DIAGNOSIS — Z5111 Encounter for antineoplastic chemotherapy: Secondary | ICD-10-CM

## 2016-12-02 DIAGNOSIS — C3491 Malignant neoplasm of unspecified part of right bronchus or lung: Secondary | ICD-10-CM | POA: Diagnosis not present

## 2016-12-02 LAB — CBC WITH DIFFERENTIAL/PLATELET
BASO%: 0.2 % (ref 0.0–2.0)
Basophils Absolute: 0 10*3/uL (ref 0.0–0.1)
EOS%: 0 % (ref 0.0–7.0)
Eosinophils Absolute: 0 10*3/uL (ref 0.0–0.5)
HCT: 32.7 % — ABNORMAL LOW (ref 34.8–46.6)
HGB: 11.2 g/dL — ABNORMAL LOW (ref 11.6–15.9)
LYMPH%: 11.9 % — ABNORMAL LOW (ref 14.0–49.7)
MCH: 33.2 pg (ref 25.1–34.0)
MCHC: 34.2 g/dL (ref 31.5–36.0)
MCV: 97.2 fL (ref 79.5–101.0)
MONO#: 0.5 10*3/uL (ref 0.1–0.9)
MONO%: 7.9 % (ref 0.0–14.0)
NEUT#: 5.1 10*3/uL (ref 1.5–6.5)
NEUT%: 80 % — ABNORMAL HIGH (ref 38.4–76.8)
Platelets: 261 10*3/uL (ref 145–400)
RBC: 3.36 10*6/uL — ABNORMAL LOW (ref 3.70–5.45)
RDW: 15.1 % — ABNORMAL HIGH (ref 11.2–14.5)
WBC: 6.4 10*3/uL (ref 3.9–10.3)
lymph#: 0.8 10*3/uL — ABNORMAL LOW (ref 0.9–3.3)

## 2016-12-02 LAB — COMPREHENSIVE METABOLIC PANEL
ALT: 27 U/L (ref 0–55)
AST: 36 U/L — ABNORMAL HIGH (ref 5–34)
Albumin: 3.8 g/dL (ref 3.5–5.0)
Alkaline Phosphatase: 63 U/L (ref 40–150)
Anion Gap: 8 mEq/L (ref 3–11)
BUN: 18.9 mg/dL (ref 7.0–26.0)
CO2: 26 mEq/L (ref 22–29)
Calcium: 9.8 mg/dL (ref 8.4–10.4)
Chloride: 103 mEq/L (ref 98–109)
Creatinine: 1.1 mg/dL (ref 0.6–1.1)
EGFR: 52 mL/min/{1.73_m2} — ABNORMAL LOW (ref >90)
Glucose: 105 mg/dl (ref 70–140)
Potassium: 4.3 mEq/L (ref 3.5–5.1)
Sodium: 137 mEq/L (ref 136–145)
Total Bilirubin: 0.83 mg/dL (ref 0.20–1.20)
Total Protein: 7.2 g/dL (ref 6.4–8.3)

## 2016-12-02 MED ORDER — SODIUM CHLORIDE 0.9 % IV SOLN
Freq: Once | INTRAVENOUS | Status: AC
  Administered 2016-12-02: 15:00:00 via INTRAVENOUS

## 2016-12-02 MED ORDER — SODIUM CHLORIDE 0.9 % IV SOLN
525.0000 mg/m2 | Freq: Once | INTRAVENOUS | Status: AC
  Administered 2016-12-02: 1000 mg via INTRAVENOUS
  Filled 2016-12-02: qty 40

## 2016-12-02 MED ORDER — SODIUM CHLORIDE 0.9% FLUSH
10.0000 mL | INTRAVENOUS | Status: DC | PRN
  Administered 2016-12-02: 10 mL
  Filled 2016-12-02: qty 10

## 2016-12-02 MED ORDER — PROCHLORPERAZINE MALEATE 10 MG PO TABS
ORAL_TABLET | ORAL | Status: AC
  Filled 2016-12-02: qty 1

## 2016-12-02 MED ORDER — HEPARIN SOD (PORK) LOCK FLUSH 100 UNIT/ML IV SOLN
500.0000 [IU] | Freq: Once | INTRAVENOUS | Status: AC | PRN
  Administered 2016-12-02: 500 [IU]
  Filled 2016-12-02: qty 5

## 2016-12-02 MED ORDER — PROCHLORPERAZINE MALEATE 10 MG PO TABS
10.0000 mg | ORAL_TABLET | Freq: Once | ORAL | Status: AC
Start: 2016-12-02 — End: 2016-12-02
  Administered 2016-12-02: 10 mg via ORAL

## 2016-12-02 NOTE — Progress Notes (Signed)
[facility] Follow up:    Colon Branch, MD [address] [place] [address]   DIAGNOSIS: Cancer Staging Adenocarcinoma of right lung, stage 4 [facility]) Staging form: Lung, AJCC 7th Edition - Clinical stage from 09/01/2015: Stage IV (T1b, N2, M1b) - Signed by Curt Bears, MD on 09/01/2015   SUMMARY OF ONCOLOGIC HISTORY: Metastatic non small cell lung cancer Induction systemic chemotherapy with carboplatin for AUC of 5 and Alimta 500 MG/M2 every 3 weeks is status post 6 cycles at the [facility], last dose was given 03/05/2016 with stable disease.  Molecular studies:PDL 1 TPS <1%.   [facility] One Studies: Positive for ERBB2 [id number]. Negative for EGFR, KRAS, ALK, BRAF, MET, RET and ROS1.   CURRENT THERAPY:1) Maintenance chemotherapy with Alimta 500 MG/M2 every 3 weeks. First dose was given 03/25/2016.  2) Zometa 4 mg IV every 12 week for metastatic bone disease.  INTERVAL HISTORY: Valerie Long 71 y.o. female returns for evaluation prior to receiving Alimta.  She is tolerating it well.  She has some new numbness in her legs, not in her feet or hands.  The numbness is intermittent left worse than right.  She also notes pain in her hips and lower back.  She denies any other questions or concerns today.     Patient Active Problem List   Diagnosis Date Noted  . 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 Hypertension 06/18/2016  . Encounter for antineoplastic chemotherapy 03/17/2016  . Adenocarcinoma of right lung, stage 4 ([place])   . Lung mass 07/08/2015  . PCP NOTES >>> 03/22/2015  . CTS (carpal tunnel syndrome) 12/06/2013  . Annual physical exam 08/29/2012  . Pain in joint, shoulder region 08/29/2012  . Vaginal atrophy 06/17/2011  . Osteopenia   . Hypercholesterolemia     has No Known Allergies.  MEDICAL HISTORY: Past Medical History:  Diagnosis Date  . Adenocarcinoma of right lung, stage 4 ([place])  2017  . Adenocarcinoma of right lung, stage 4 ([place])   . Anemia   . Arthritis   . Encounter for antineoplastic chemotherapy 03/17/2016  . Hypercholesterolemia   . Hypertension 06/18/2016  . Osteopenia   . 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 Pneumonia   . Shortness of breath dyspnea     SURGICAL HISTORY: Past Surgical History:  Procedure Laterality Date  . CESAREAN SECTION     myomectomy  . COLONOSCOPY W/ POLYPECTOMY    . IR GENERIC HISTORICAL  08/17/2016   IR US GUIDE VASC ACCESS RIGHT 08/17/2016 [name], MD [facility]  . IR GENERIC HISTORICAL  08/17/2016   IR FLUORO GUIDE PORT INSERTION RIGHT 08/17/2016 [name], MD [facility]  . MEDIASTINOSCOPY N/A 09/25/2015   Procedure: MEDIASTINOSCOPY;  Surgeon: Melrose Nakayama, MD;  Location: [place] [facility];  Service: Thoracic;  Laterality: N/A;  . VIDEO BRONCHOSCOPY Bilateral 08/19/2015   Procedure: VIDEO BRONCHOSCOPY WITH FLUORO;  Surgeon: Rigoberto Noel, MD;  Location: [facility];  Service: Cardiopulmonary;  Laterality: Bilateral;  . VIDEO BRONCHOSCOPY N/A 09/08/2015   Procedure: VIDEO BRONCHOSCOPY WITH FLUORO;  Surgeon: Rigoberto Noel, MD;  Location: [place];  Service: Thoracic;  Laterality: N/A;  . VIDEO BRONCHOSCOPY WITH ENDOBRONCHIAL ULTRASOUND Right 09/08/2015   Procedure: ATTEMPTED VIDEO BRONCHOSCOPY ENDOBRONCHIAL ULTRASOUND  ;  Surgeon: Rigoberto Noel, MD;  Location: [place];  Service: Thoracic;  Laterality: Right;    SOCIAL HISTORY: Social History   Social History  .  Marital status: Legally Separated    Spouse name: N/A  . Number of children: 1  . Years of education: N/A   Occupational History  . retired  Sports coach Employed   Social History Main Topics  . Smoking status: Never Smoker  . Smokeless tobacco: Never Used  . Alcohol use 0.0 oz/week     Comment: SOCIAL  . Drug use: No  . Sexual activity: No   Other Topics Concern  . Not on file   Social History Narrative   From [place],  moved from [place]  to [place] 2008   Lives w/ her mother and her son     FAMILY HISTORY: Family History  Problem Relation Age of Onset  . Hypertension Mother        F and M  . CAD Father   . Diabetes Other        auncle-aunts   . Cancer Other        UTERINE   . Breast cancer Sister   . Stroke Neg Hx   . Colon cancer Neg Hx     Review of Systems  Constitutional: Negative for appetite change, chills, fatigue, fever and unexpected weight change.  HENT:   Negative for hearing loss and lump/mass.   Eyes: Negative for eye problems and icterus.  Respiratory: Negative for chest tightness, cough and shortness of breath.   Cardiovascular: Negative for chest pain, leg swelling and palpitations.  Gastrointestinal: Negative for abdominal distention, abdominal pain, constipation, diarrhea, nausea and vomiting.  Endocrine: Negative for hot flashes.  Genitourinary: Negative for difficulty urinating.   Musculoskeletal: Negative for arthralgias.  Skin: Negative for itching and rash.  Neurological: Negative for dizziness, extremity weakness and headaches.  Hematological: Negative for adenopathy. Does not bruise/bleed easily.  Psychiatric/Behavioral: Negative for depression. The patient is not nervous/anxious.       PHYSICAL EXAMINATION  ECOG PERFORMANCE STATUS: 1 - Symptomatic but completely ambulatory  Vitals:   12/02/16 1139  BP: (!) 148/74  Pulse: 78  Resp: 18  Temp: 97.8 F (36.6 C)    Physical Exam  Constitutional: She is oriented to person, place, and time and well-developed, well-nourished, and in no distress.  HENT:  Head: Normocephalic and atraumatic.  Mouth/Throat: Oropharynx is clear and moist. No oropharyngeal exudate.  Eyes: Pupils are equal, round, and reactive to light. No scleral icterus.  Neck: Neck supple.  Cardiovascular: Normal rate, regular rhythm and normal heart sounds.   Pulmonary/Chest: Effort normal and breath sounds normal. No respiratory distress. She has no  wheezes. She has no rales.  Abdominal: Soft. Bowel sounds are normal. She exhibits no distension. There is no tenderness.  Musculoskeletal: She exhibits no edema.  Lymphadenopathy:    She has no cervical adenopathy.  Neurological: She is alert and oriented to person, place, and time.  Skin: Skin is warm and dry. No rash noted. No erythema.  Psychiatric: Mood and affect normal.    LABORATORY DATA:  CBC    Component Value Date/Time   WBC 6.4 12/02/2016 1119   WBC 5.3 08/17/2016 1237   RBC 3.36 (L) 12/02/2016 1119   RBC 3.43 (L) 08/17/2016 1237   HGB 11.2 (L) 12/02/2016 1119   HCT 32.7 (L) 12/02/2016 1119   PLT 261 12/02/2016 1119   MCV 97.2 12/02/2016 1119   MCH 33.2 12/02/2016 1119   MCH 32.1 08/17/2016 1237   MCHC 34.2 12/02/2016 1119   MCHC 33.2 08/17/2016 1237   RDW 15.1 (H) 12/02/2016 1119   LYMPHSABS  0.8 (L) 12/02/2016 1119   MONOABS 0.5 12/02/2016 1119   EOSABS 0.0 12/02/2016 1119   BASOSABS 0.0 12/02/2016 1119    CMP     Component Value Date/Time   NA 137 12/02/2016 1119   K 4.3 12/02/2016 1119   CL 107 09/25/2015 0805   CO2 26 12/02/2016 1119   GLUCOSE 105 12/02/2016 1119   BUN 18.9 12/02/2016 1119   CREATININE 1.1 12/02/2016 1119   CALCIUM 9.8 12/02/2016 1119   PROT 7.2 12/02/2016 1119   ALBUMIN 3.8 12/02/2016 1119   AST 36 (H) 12/02/2016 1119   ALT 27 12/02/2016 1119   ALKPHOS 63 12/02/2016 1119   BILITOT 0.83 12/02/2016 1119   GFRNONAA 53 (L) 09/25/2015 0805   GFRAA >60 09/25/2015 0805       ASSESSMENT and PLAN:   Adenocarcinoma of right lung, stage 4 ([facility]) Niana is doing well today.  Her labs are stable and she will proceed with treatment today.  I reviewed them with her in detail.  Her scans remain stable.  I reviewed that she has scoliosis and foraminal narrowing that is contributing to impingement and likely the cause of the pain and intermittent numbness.  I recommended stretching and Yoga to help with this.  It it worsens in any way,  shape, or form, or if she develops weakness she should let us know.  She will follow up in 3 months with Dr. Julien Nordmann.    All questions were answered. The patient knows to call the clinic with any problems, questions or concerns. We can certainly see the patient much sooner if necessary.  A total of (30) minutes of face-to-face time was spent with this patient with greater than 50% of that time in counseling and care-coordination.  This note was electronically signed. Scot Dock, NP 12/02/2016

## 2016-12-02 NOTE — Assessment & Plan Note (Signed)
Valerie Long is doing well today.  Her labs are stable and she will proceed with treatment today.  I reviewed them with her in detail.  Her scans remain stable.  I reviewed that she has scoliosis and foraminal narrowing that is contributing to impingement and likely the cause of the pain and intermittent numbness.  I recommended stretching and Yoga to help with this.  It it worsens in any way, shape, or form, or if she develops weakness she should let us know.  She will follow up in 3 months with Dr. Julien Nordmann.

## 2016-12-02 NOTE — Patient Instructions (Signed)
[facility] Discharge Instructions for Patients Receiving Chemotherapy  Today you received the following chemotherapy agents Alimta.  To help prevent nausea and vomiting after your treatment, we encourage you to take your nausea medication.   If you develop nausea and vomiting that is not controlled by your nausea medication, call the clinic.   BELOW ARE SYMPTOMS THAT SHOULD BE REPORTED IMMEDIATELY:  *FEVER GREATER THAN 100.5 F  *CHILLS WITH OR WITHOUT FEVER  NAUSEA AND VOMITING THAT IS NOT CONTROLLED WITH YOUR NAUSEA MEDICATION  *UNUSUAL SHORTNESS OF BREATH  *UNUSUAL BRUISING OR BLEEDING  TENDERNESS IN MOUTH AND THROAT WITH OR WITHOUT PRESENCE OF ULCERS  *URINARY PROBLEMS  *BOWEL PROBLEMS  UNUSUAL RASH Items with * indicate a potential emergency and should be followed up as soon as possible.  Feel free to call the clinic you have any questions or concerns. The clinic phone number is [phone].  Please show the [facility] at check-in to the Emergency Department and triage nurse.

## 2016-12-23 ENCOUNTER — Encounter: Payer: Self-pay | Admitting: Internal Medicine

## 2016-12-23 ENCOUNTER — Ambulatory Visit (HOSPITAL_BASED_OUTPATIENT_CLINIC_OR_DEPARTMENT_OTHER): Payer: Medicare Other | Admitting: Internal Medicine

## 2016-12-23 ENCOUNTER — Other Ambulatory Visit (HOSPITAL_BASED_OUTPATIENT_CLINIC_OR_DEPARTMENT_OTHER): Payer: Medicare Other

## 2016-12-23 ENCOUNTER — Telehealth: Payer: Self-pay | Admitting: Internal Medicine

## 2016-12-23 ENCOUNTER — Ambulatory Visit (HOSPITAL_BASED_OUTPATIENT_CLINIC_OR_DEPARTMENT_OTHER): Payer: Medicare Other

## 2016-12-23 VITALS — BP 126/76 | HR 97 | Temp 97.6°F | Resp 18 | Ht 69.0 in | Wt 160.2 lb

## 2016-12-23 DIAGNOSIS — C3431 Malignant neoplasm of lower lobe, right bronchus or lung: Secondary | ICD-10-CM

## 2016-12-23 DIAGNOSIS — C7951 Secondary malignant neoplasm of bone: Secondary | ICD-10-CM

## 2016-12-23 DIAGNOSIS — C3491 Malignant neoplasm of unspecified part of right bronchus or lung: Secondary | ICD-10-CM

## 2016-12-23 DIAGNOSIS — Z5111 Encounter for antineoplastic chemotherapy: Secondary | ICD-10-CM

## 2016-12-23 LAB — CBC WITH DIFFERENTIAL/PLATELET
BASO%: 0.6 % (ref 0.0–2.0)
Basophils Absolute: 0 10*3/uL (ref 0.0–0.1)
EOS%: 0 % (ref 0.0–7.0)
Eosinophils Absolute: 0 10*3/uL (ref 0.0–0.5)
HCT: 36.2 % (ref 34.8–46.6)
HGB: 12.2 g/dL (ref 11.6–15.9)
LYMPH%: 9.8 % — ABNORMAL LOW (ref 14.0–49.7)
MCH: 33.2 pg (ref 25.1–34.0)
MCHC: 33.6 g/dL (ref 31.5–36.0)
MCV: 98.6 fL (ref 79.5–101.0)
MONO#: 0.2 10*3/uL (ref 0.1–0.9)
MONO%: 3 % (ref 0.0–14.0)
NEUT#: 5.4 10*3/uL (ref 1.5–6.5)
NEUT%: 86.6 % — ABNORMAL HIGH (ref 38.4–76.8)
Platelets: 242 10*3/uL (ref 145–400)
RBC: 3.67 10*6/uL — ABNORMAL LOW (ref 3.70–5.45)
RDW: 15.4 % — ABNORMAL HIGH (ref 11.2–14.5)
WBC: 6.2 10*3/uL (ref 3.9–10.3)
lymph#: 0.6 10*3/uL — ABNORMAL LOW (ref 0.9–3.3)

## 2016-12-23 LAB — COMPREHENSIVE METABOLIC PANEL
ALT: 32 U/L (ref 0–55)
AST: 42 U/L — ABNORMAL HIGH (ref 5–34)
Albumin: 3.8 g/dL (ref 3.5–5.0)
Alkaline Phosphatase: 65 U/L (ref 40–150)
Anion Gap: 9 mEq/L (ref 3–11)
BUN: 19.4 mg/dL (ref 7.0–26.0)
CO2: 27 mEq/L (ref 22–29)
Calcium: 10 mg/dL (ref 8.4–10.4)
Chloride: 101 mEq/L (ref 98–109)
Creatinine: 1.3 mg/dL — ABNORMAL HIGH (ref 0.6–1.1)
EGFR: 43 mL/min/{1.73_m2} — ABNORMAL LOW (ref >90)
Glucose: 174 mg/dl — ABNORMAL HIGH (ref 70–140)
Potassium: 4.4 mEq/L (ref 3.5–5.1)
Sodium: 138 mEq/L (ref 136–145)
Total Bilirubin: 0.76 mg/dL (ref 0.20–1.20)
Total Protein: 7.6 g/dL (ref 6.4–8.3)

## 2016-12-23 MED ORDER — PROCHLORPERAZINE MALEATE 10 MG PO TABS
ORAL_TABLET | ORAL | Status: AC
  Filled 2016-12-23: qty 1

## 2016-12-23 MED ORDER — PROCHLORPERAZINE MALEATE 10 MG PO TABS
10.0000 mg | ORAL_TABLET | Freq: Once | ORAL | Status: AC
  Administered 2016-12-23: 10 mg via ORAL

## 2016-12-23 MED ORDER — SODIUM CHLORIDE 0.9 % IV SOLN
Freq: Once | INTRAVENOUS | Status: AC
  Administered 2016-12-23: 12:00:00 via INTRAVENOUS

## 2016-12-23 MED ORDER — SODIUM CHLORIDE 0.9 % IV SOLN
525.0000 mg/m2 | Freq: Once | INTRAVENOUS | Status: AC
  Administered 2016-12-23: 1000 mg via INTRAVENOUS
  Filled 2016-12-23: qty 40

## 2016-12-23 MED ORDER — HEPARIN SOD (PORK) LOCK FLUSH 100 UNIT/ML IV SOLN
500.0000 [IU] | Freq: Once | INTRAVENOUS | Status: AC | PRN
Start: 2016-12-23 — End: 2016-12-23
  Administered 2016-12-23: 500 [IU]
  Filled 2016-12-23: qty 5

## 2016-12-23 MED ORDER — SODIUM CHLORIDE 0.9% FLUSH
10.0000 mL | INTRAVENOUS | Status: DC | PRN
  Administered 2016-12-23: 10 mL
  Filled 2016-12-23: qty 10

## 2016-12-23 NOTE — Progress Notes (Signed)
[place] [phone]) [phone]   Fax:(336) [place], MD [place] [address] [place] [address]  DIAGNOSIS: Stage IV (T1b, N2, M1b) non-small cell lung cancer, adenocarcinoma diagnosed in March 2017 and presented with right lower lobe lung nodule in addition to mediastinal lymphadenopathy and metastatic bone lesions.  Molecular studies: PDL 1 TPS  <1%.   Foundation One Studies: Positive for ERBB2 [id number]. Negative for EGFR, KRAS, ALK, BRAF, MET, RET and ROS1.  PRIOR THERAPY: Induction systemic chemotherapy with carboplatin for AUC of 5 and Alimta 500 MG/M2 every 3 weeks is status post 6 cycles at the [facility], last dose was given 03/05/2016 with stable disease.  CURRENT THERAPY::  1) Maintenance chemotherapy with Alimta 500 MG/M2 every 3 weeks status post 13 cycles. First dose was given 03/25/2016.  2) Zometa 4 mg IV every 12 week for metastatic bone disease.  INTERVAL HISTORY: Valerie Long 71 y.o. female returns to the clinic today for follow-up visit. The patient has no complaints today except for pain and the right lower back with radiation to the right leg questionable for sciatica. She denied having any chest pain, shortness of breath, cough or hemoptysis. She denied having any fever or chills. She has no nausea, vomiting, diarrhea or constipation. She tolerated the last cycle of her treatment with Alimta fairly well. She is here today for evaluation before starting cycle #14.  MEDICAL HISTORY: Past Medical History:  Diagnosis Date  . Adenocarcinoma of right lung, stage 4 ([place]) 2017  . Adenocarcinoma of right lung, stage 4 ([place])   . Anemia   . Arthritis   . Encounter for antineoplastic chemotherapy 03/17/2016  . Hypercholesterolemia   . Hypertension 06/18/2016  . Osteopenia   . 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 Pneumonia   . Shortness of breath dyspnea      ALLERGIES:  has No Known Allergies.  MEDICATIONS:  Current Outpatient Prescriptions  Medication Sig Dispense Refill  . acetaminophen (TYLENOL) 500 MG tablet Take 1,000 mg by mouth every 6 (six) hours as needed for moderate pain or headache.    . Calcium Carb-Cholecalciferol (CALCIUM 600/VITAMIN D3 PO) Take by mouth.    . Cholecalciferol (VITAMIN D3 PO) Take 1 tablet by mouth 2 (two) times daily.     . Cyanocobalamin (B-12 PO) Take 2 tablets by mouth daily.    Marland Kitchen dexamethasone (DECADRON) 4 MG tablet Take 4 tablets (16 mg total) by mouth 2 (two) times daily. Take 17m by mouth twice daily the day before, of, and after chemo (Patient taking differently: Take 4 mg by mouth 2 (two) times daily. Take 439mby mouth twice daily the day before, of, and after chemo) 30 tablet 2  . Fish Oil-Cholecalciferol (OMEGA-3 + VITAMIN D3 PO) Take 1 tablet by mouth daily.    . folic acid (FOLVITE) 1 MG tablet Take 1 tablet (1 mg total) by mouth daily. 90 tablet 0  . lidocaine-prilocaine (EMLA) cream Apply 1 application topically as needed. Apply 1-2 tsp over port site 1-2 hours prior to chemo. 30 g 0  . PRESCRIPTION MEDICATION Inject 1 Dose as directed every 21 ( twenty-one) days. Chemo    . ondansetron (ZOFRAN) 8 MG tablet Take 8 mg by mouth every 8 (eight) hours as needed.     No current facility-administered medications for this visit.     SURGICAL HISTORY:  Past  Surgical History:  Procedure Laterality Date  . CESAREAN SECTION     myomectomy  . COLONOSCOPY W/ POLYPECTOMY    . IR GENERIC HISTORICAL  08/17/2016   IR US GUIDE VASC ACCESS RIGHT 08/17/2016 [name], MD [facility]  . IR GENERIC HISTORICAL  08/17/2016   IR FLUORO GUIDE PORT INSERTION RIGHT 08/17/2016 [name], MD [facility]  . MEDIASTINOSCOPY N/A 09/25/2015   Procedure: MEDIASTINOSCOPY;  Surgeon: Melrose Nakayama, MD;  Location: [facility];  Service: Thoracic;  Laterality: N/A;  . VIDEO BRONCHOSCOPY Bilateral 08/19/2015    Procedure: VIDEO BRONCHOSCOPY WITH FLUORO;  Surgeon: Rigoberto Noel, MD;  Location: [facility];  Service: Cardiopulmonary;  Laterality: Bilateral;  . VIDEO BRONCHOSCOPY N/A 09/08/2015   Procedure: VIDEO BRONCHOSCOPY WITH FLUORO;  Surgeon: Rigoberto Noel, MD;  Location: [place];  Service: Thoracic;  Laterality: N/A;  . VIDEO BRONCHOSCOPY WITH ENDOBRONCHIAL ULTRASOUND Right 09/08/2015   Procedure: ATTEMPTED VIDEO BRONCHOSCOPY ENDOBRONCHIAL ULTRASOUND  ;  Surgeon: Rigoberto Noel, MD;  Location: [facility];  Service: Thoracic;  Laterality: Right;    REVIEW OF SYSTEMS:  A comprehensive review of systems was negative except for: Musculoskeletal: positive for arthralgias and back pain   PHYSICAL EXAMINATION: General appearance: alert, cooperative and no distress Head: Normocephalic, without obvious abnormality, atraumatic Neck: no adenopathy, no JVD, supple, symmetrical, trachea midline and thyroid not enlarged, symmetric, no tenderness/mass/nodules Lymph nodes: Cervical, supraclavicular, and axillary nodes normal. Resp: clear to auscultation bilaterally Back: symmetric, no curvature. ROM normal. No CVA tenderness. Cardio: regular rate and rhythm, S1, S2 normal, no murmur, click, rub or gallop GI: soft, non-tender; bowel sounds normal; no masses,  no organomegaly Extremities: extremities normal, atraumatic, no cyanosis or edema  ECOG PERFORMANCE STATUS: 0 - Asymptomatic  Blood pressure 126/76, pulse 97, temperature 97.6 F (36.4 C), temperature source Oral, resp. rate 18, height '5\' 9"'  (1.753 m), weight 160 lb 3.2 oz (72.7 kg), last menstrual period 06/09/1998, SpO2 100 %.  LABORATORY DATA: Lab Results  Component Value Date   WBC 6.2 12/23/2016   HGB 12.2 12/23/2016   HCT 36.2 12/23/2016   MCV 98.6 12/23/2016   PLT 242 12/23/2016      Chemistry      Component Value Date/Time   NA 138 12/23/2016 1057   K 4.4 12/23/2016 1057   CL 107 09/25/2015 0805   CO2 27 12/23/2016 1057   BUN 19.4 12/23/2016  1057   CREATININE 1.3 (H) 12/23/2016 1057   GLU 170 01/23/2016      Component Value Date/Time   CALCIUM 10.0 12/23/2016 1057   ALKPHOS 65 12/23/2016 1057   AST 42 (H) 12/23/2016 1057   ALT 32 12/23/2016 1057   BILITOT 0.76 12/23/2016 1057       RADIOGRAPHIC STUDIES: Ct Chest W Contrast  Result Date: 12/01/2016 CLINICAL DATA:  Lung cancer with osseous metastatic disease, chemotherapy in progress. EXAM: CT CHEST, ABDOMEN, AND PELVIS WITH CONTRAST TECHNIQUE: Multidetector CT imaging of the chest, abdomen and pelvis was performed following the standard protocol during bolus administration of intravenous contrast. CONTRAST:  78m ISOVUE-300 IOPAMIDOL (ISOVUE-300) INJECTION 61% COMPARISON:  Multiple exams, including 09/28/2016 FINDINGS: CT CHEST FINDINGS Cardiovascular: Right Port-A-Cath tip:  Lower SVC. Mild atherosclerotic calcification of the aortic arch. Mild cardiomegaly. Mediastinum/Nodes: No pathologic adenopathy. Lungs/Pleura: Biapical pleuroparenchymal scarring. 5 by 3 mm right upper lobe pulmonary nodule on image 49/4, formerly the same. Scarring in the right lower lobe and right middle lobe, stable. Confluent nodular density along the right lower  lobe scarring measures 1.5 cm transversely, previously 1.5 cm. Nodule in the superior segment right lower lobe measuring 5 by 4 mm on image 48/4, stable. Several other small right pulmonary nodules are stable. 4 mm lingular nodule on image 78/4 is stable. Mild scarring and chronic interstitial accentuation in the left lower lobe peripherally. No new nodules are identified. Musculoskeletal: Stable scattered small sclerotic lesions in the thoracic spine, ribs, bilateral scapula, and right clavicle in addition to the sternum. CT ABDOMEN PELVIS FINDINGS Hepatobiliary: Multiple small gallstones. No significant focal liver lesion. Pancreas: Unremarkable Spleen: Unremarkable Adrenals/Urinary Tract: Non rotated and somewhat flattened left kidney is pelvic in  location. Adrenal glands normal. Stomach/Bowel: Potential wall thickening in the stomach body, images 58-59 series 2. On the prior exam I ascribed this to likely peristalsis but a similar appearance is present today, and this could reflect antritis or less likely tumor. There loops of small bowel extending out posterolateral to the descending colon and cecum, suggesting a mobile cecum. Vascular/Lymphatic: Left external iliac lymph node 1.0 cm in short axis, previously the same. Reproductive: Elongated appearance of the uterus on sagittal images -occasionally a unicornuate uterus can produce this type of appearance. Other: No supplemental non-categorized findings. Musculoskeletal: Stable appearance of scattered punctate sclerotic metastatic lesions. Levoconvex upper lumbar scoliosis with spondylosis and degenerative disc disease causing left foraminal impingement at L3-4, L4-5, and L5-S1. IMPRESSION: 1. Generally similar appearance on today' s exam compared to the prior exam. Numerous sclerotic foci throughout the skeleton compatible with prior sclerotic metastatic lesions. Several small pulmonary nodules are stable and the nodular thickening along the scarring in the right lower lobe is likewise stable. 2. Stable borderline enlarged left external iliac lymph node. 3. Other imaging findings of potential clinical significance: Small gallstones. 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 Left pelvic kidney appears flattened and non rotated, unchanged. Levoconvex upper lumbar scoliosis with spondylosis and degenerative disc disease causing lower lumbar impingement eccentric to the left. Mild cardiomegaly. Aortic Atherosclerosis (ICD10-I70.0). Electronically Signed   By: Van Clines M.D.   On: 12/01/2016 17:01   Ct Abdomen Pelvis W Contrast  Result Date: 12/01/2016 CLINICAL DATA:  Lung cancer with osseous metastatic disease, chemotherapy in progress. EXAM: CT CHEST, ABDOMEN, AND PELVIS WITH CONTRAST  TECHNIQUE: Multidetector CT imaging of the chest, abdomen and pelvis was performed following the standard protocol during bolus administration of intravenous contrast. CONTRAST:  70m ISOVUE-300 IOPAMIDOL (ISOVUE-300) INJECTION 61% COMPARISON:  Multiple exams, including 09/28/2016 FINDINGS: CT CHEST FINDINGS Cardiovascular: Right Port-A-Cath tip:  Lower SVC. Mild atherosclerotic calcification of the aortic arch. Mild cardiomegaly. Mediastinum/Nodes: No pathologic adenopathy. Lungs/Pleura: Biapical pleuroparenchymal scarring. 5 by 3 mm right upper lobe pulmonary nodule on image 49/4, formerly the same. Scarring in the right lower lobe and right middle lobe, stable. Confluent nodular density along the right lower lobe scarring measures 1.5 cm transversely, previously 1.5 cm. Nodule in the superior segment right lower lobe measuring 5 by 4 mm on image 48/4, stable. Several other small right pulmonary nodules are stable. 4 mm lingular nodule on image 78/4 is stable. Mild scarring and chronic interstitial accentuation in the left lower lobe peripherally. No new nodules are identified. Musculoskeletal: Stable scattered small sclerotic lesions in the thoracic spine, ribs, bilateral scapula, and right clavicle in addition to the sternum. CT ABDOMEN PELVIS FINDINGS Hepatobiliary: Multiple small gallstones. No significant focal liver lesion. Pancreas: Unremarkable Spleen: Unremarkable Adrenals/Urinary Tract: Non rotated and somewhat flattened left kidney is pelvic in location. Adrenal glands normal. Stomach/Bowel: Potential  wall thickening in the stomach body, images 58-59 series 2. On the prior exam I ascribed this to likely peristalsis but a similar appearance is present today, and this could reflect antritis or less likely tumor. There loops of small bowel extending out posterolateral to the descending colon and cecum, suggesting a mobile cecum. Vascular/Lymphatic: Left external iliac lymph node 1.0 cm in short axis,  previously the same. Reproductive: Elongated appearance of the uterus on sagittal images -occasionally a unicornuate uterus can produce this type of appearance. Other: No supplemental non-categorized findings. Musculoskeletal: Stable appearance of scattered punctate sclerotic metastatic lesions. Levoconvex upper lumbar scoliosis with spondylosis and degenerative disc disease causing left foraminal impingement at L3-4, L4-5, and L5-S1. IMPRESSION: 1. Generally similar appearance on today' s exam compared to the prior exam. Numerous sclerotic foci throughout the skeleton compatible with prior sclerotic metastatic lesions. Several small pulmonary nodules are stable and the nodular thickening along the scarring in the right lower lobe is likewise stable. 2. Stable borderline enlarged left external iliac lymph node. 3. Other imaging findings of potential clinical significance: Small gallstones. 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 Left pelvic kidney appears flattened and non rotated, unchanged. Levoconvex upper lumbar scoliosis with spondylosis and degenerative disc disease causing lower lumbar impingement eccentric to the left. Mild cardiomegaly. Aortic Atherosclerosis (ICD10-I70.0). Electronically Signed   By: Van Clines M.D.   On: 12/01/2016 17:01    ASSESSMENT AND PLAN:  This is a very pleasant 71 years old Hispanic female with metastatic non-small cell lung cancer, adenocarcinoma status post induction systemic chemotherapy with carboplatin and Alimta and she is currently on maintenance treatment with single agent Alimta status post 13 cycles. The patient tolerated cycle #13 of her treatment fairly well with no significant adverse effects. There was no clear evidence for disease progression on the recent CT scan of the chest, abdomen and pelvis. I discussed the scan results again with the patient today. I recommended for her to proceed with cycle #14 today as a scheduled. I would see her  back for follow-up visit in 3 weeks for evaluation before starting cycle #15. The patient was advised to call immediately if she has any concerning symptoms in the interval. The patient voices understanding of current disease status and treatment options and is in agreement with the current care plan. All questions were answered. The patient knows to call the clinic with any problems, questions or concerns. We can certainly see the patient much sooner if necessary. I spent 10 minutes counseling the patient face to face. The total time spent in the appointment was 15 minutes.  Disclaimer: This note was dictated with voice recognition software. Similar sounding words can inadvertently be transcribed and may not be corrected upon review.

## 2016-12-23 NOTE — Telephone Encounter (Signed)
Scheduled appt per 7/19 los - Gave patient AVS and calender per los.

## 2016-12-23 NOTE — Patient Instructions (Signed)
[facility] Discharge Instructions for Patients Receiving Chemotherapy  Today you received the following chemotherapy agents Alimta.  To help prevent nausea and vomiting after your treatment, we encourage you to take your nausea medication as prescribed.   If you develop nausea and vomiting that is not controlled by your nausea medication, call the clinic.   BELOW ARE SYMPTOMS THAT SHOULD BE REPORTED IMMEDIATELY:  *FEVER GREATER THAN 100.5 F  *CHILLS WITH OR WITHOUT FEVER  NAUSEA AND VOMITING THAT IS NOT CONTROLLED WITH YOUR NAUSEA MEDICATION  *UNUSUAL SHORTNESS OF BREATH  *UNUSUAL BRUISING OR BLEEDING  TENDERNESS IN MOUTH AND THROAT WITH OR WITHOUT PRESENCE OF ULCERS  *URINARY PROBLEMS  *BOWEL PROBLEMS  UNUSUAL RASH Items with * indicate a potential emergency and should be followed up as soon as possible.  Feel free to call the clinic you have any questions or concerns. The clinic phone number is [phone].  Please show the CHEMO ALERT CARD at check-in to the Emergency Department and triage nurse.   

## 2016-12-30 ENCOUNTER — Other Ambulatory Visit: Payer: Self-pay | Admitting: Gynecology

## 2016-12-30 ENCOUNTER — Ambulatory Visit (INDEPENDENT_AMBULATORY_CARE_PROVIDER_SITE_OTHER): Payer: Medicare Other

## 2016-12-30 DIAGNOSIS — Z780 Asymptomatic menopausal state: Secondary | ICD-10-CM

## 2016-12-30 DIAGNOSIS — M8580 Other specified disorders of bone density and structure, unspecified site: Secondary | ICD-10-CM

## 2016-12-30 DIAGNOSIS — Z85118 Personal history of other malignant neoplasm of bronchus and lung: Secondary | ICD-10-CM | POA: Diagnosis not present

## 2016-12-30 DIAGNOSIS — M8588 Other specified disorders of bone density and structure, other site: Secondary | ICD-10-CM | POA: Diagnosis not present

## 2017-01-13 ENCOUNTER — Telehealth: Payer: Self-pay | Admitting: Internal Medicine

## 2017-01-13 ENCOUNTER — Other Ambulatory Visit (HOSPITAL_BASED_OUTPATIENT_CLINIC_OR_DEPARTMENT_OTHER): Payer: Medicare Other

## 2017-01-13 ENCOUNTER — Ambulatory Visit (HOSPITAL_BASED_OUTPATIENT_CLINIC_OR_DEPARTMENT_OTHER): Payer: Medicare Other | Admitting: Internal Medicine

## 2017-01-13 ENCOUNTER — Ambulatory Visit (HOSPITAL_BASED_OUTPATIENT_CLINIC_OR_DEPARTMENT_OTHER): Payer: Medicare Other

## 2017-01-13 DIAGNOSIS — C7951 Secondary malignant neoplasm of bone: Secondary | ICD-10-CM

## 2017-01-13 DIAGNOSIS — C3431 Malignant neoplasm of lower lobe, right bronchus or lung: Secondary | ICD-10-CM

## 2017-01-13 DIAGNOSIS — C3491 Malignant neoplasm of unspecified part of right bronchus or lung: Secondary | ICD-10-CM

## 2017-01-13 DIAGNOSIS — Z5111 Encounter for antineoplastic chemotherapy: Secondary | ICD-10-CM

## 2017-01-13 LAB — CBC WITH DIFFERENTIAL/PLATELET
BASO%: 0 % (ref 0.0–2.0)
Basophils Absolute: 0 10*3/uL (ref 0.0–0.1)
EOS%: 0 % (ref 0.0–7.0)
Eosinophils Absolute: 0 10*3/uL (ref 0.0–0.5)
HCT: 32.3 % — ABNORMAL LOW (ref 34.8–46.6)
HGB: 10.7 g/dL — ABNORMAL LOW (ref 11.6–15.9)
LYMPH%: 11.1 % — ABNORMAL LOW (ref 14.0–49.7)
MCH: 32.8 pg (ref 25.1–34.0)
MCHC: 33.1 g/dL (ref 31.5–36.0)
MCV: 99.1 fL (ref 79.5–101.0)
MONO#: 0.7 10*3/uL (ref 0.1–0.9)
MONO%: 10 % (ref 0.0–14.0)
NEUT#: 5.5 10*3/uL (ref 1.5–6.5)
NEUT%: 78.9 % — ABNORMAL HIGH (ref 38.4–76.8)
Platelets: 241 10*3/uL (ref 145–400)
RBC: 3.26 10*6/uL — ABNORMAL LOW (ref 3.70–5.45)
RDW: 14.7 % — ABNORMAL HIGH (ref 11.2–14.5)
WBC: 7 10*3/uL (ref 3.9–10.3)
lymph#: 0.8 10*3/uL — ABNORMAL LOW (ref 0.9–3.3)

## 2017-01-13 LAB — COMPREHENSIVE METABOLIC PANEL
ALT: 24 U/L (ref 0–55)
AST: 32 U/L (ref 5–34)
Albumin: 3.5 g/dL (ref 3.5–5.0)
Alkaline Phosphatase: 61 U/L (ref 40–150)
Anion Gap: 6 mEq/L (ref 3–11)
BUN: 15.8 mg/dL (ref 7.0–26.0)
CO2: 28 mEq/L (ref 22–29)
Calcium: 9.8 mg/dL (ref 8.4–10.4)
Chloride: 101 mEq/L (ref 98–109)
Creatinine: 1.1 mg/dL (ref 0.6–1.1)
EGFR: 52 mL/min/{1.73_m2} — ABNORMAL LOW (ref >90)
Glucose: 100 mg/dl (ref 70–140)
Potassium: 4.6 mEq/L (ref 3.5–5.1)
Sodium: 135 mEq/L — ABNORMAL LOW (ref 136–145)
Total Bilirubin: 0.84 mg/dL (ref 0.20–1.20)
Total Protein: 7.1 g/dL (ref 6.4–8.3)

## 2017-01-13 MED ORDER — HEPARIN SOD (PORK) LOCK FLUSH 100 UNIT/ML IV SOLN
500.0000 [IU] | Freq: Once | INTRAVENOUS | Status: AC | PRN
  Administered 2017-01-13: 500 [IU]
  Filled 2017-01-13: qty 5

## 2017-01-13 MED ORDER — SODIUM CHLORIDE 0.9 % IV SOLN
Freq: Once | INTRAVENOUS | Status: AC
  Administered 2017-01-13: 13:00:00 via INTRAVENOUS

## 2017-01-13 MED ORDER — PEMETREXED DISODIUM CHEMO INJECTION 500 MG
525.0000 mg/m2 | Freq: Once | INTRAVENOUS | Status: AC
  Administered 2017-01-13: 1000 mg via INTRAVENOUS
  Filled 2017-01-13: qty 40

## 2017-01-13 MED ORDER — PROCHLORPERAZINE MALEATE 10 MG PO TABS
10.0000 mg | ORAL_TABLET | Freq: Once | ORAL | Status: AC
  Administered 2017-01-13: 10 mg via ORAL

## 2017-01-13 MED ORDER — PROCHLORPERAZINE MALEATE 10 MG PO TABS
ORAL_TABLET | ORAL | Status: AC
  Filled 2017-01-13: qty 1

## 2017-01-13 MED ORDER — SODIUM CHLORIDE 0.9% FLUSH
10.0000 mL | INTRAVENOUS | Status: DC | PRN
  Administered 2017-01-13: 10 mL
  Filled 2017-01-13: qty 10

## 2017-01-13 MED ORDER — CYANOCOBALAMIN 1000 MCG/ML IJ SOLN
INTRAMUSCULAR | Status: AC
  Filled 2017-01-13: qty 1

## 2017-01-13 MED ORDER — CYANOCOBALAMIN 1000 MCG/ML IJ SOLN
1000.0000 ug | Freq: Once | INTRAMUSCULAR | Status: AC
  Administered 2017-01-13: 1000 ug via INTRAMUSCULAR

## 2017-01-13 MED ORDER — ZOLEDRONIC ACID 4 MG/100ML IV SOLN
4.0000 mg | Freq: Once | INTRAVENOUS | Status: AC
  Administered 2017-01-13: 4 mg via INTRAVENOUS
  Filled 2017-01-13: qty 100

## 2017-01-13 NOTE — Progress Notes (Signed)
[facility] [phone]   Fax:(336) [place], MD [place] [address] [address]  DIAGNOSIS: Stage IV (T1b, N2, M1b) non-small cell lung cancer, adenocarcinoma diagnosed in March 2017 and presented with right lower lobe lung nodule in addition to mediastinal lymphadenopathy and metastatic bone lesions.  Molecular studies: PDL 1 TPS  <1%.   Foundation One Studies: Positive for ERBB2 [id number]. Negative for EGFR, KRAS, ALK, BRAF, MET, RET and ROS1.  PRIOR THERAPY: Induction systemic chemotherapy with carboplatin for AUC of 5 and Alimta 500 MG/M2 every 3 weeks is status post 6 cycles at the [facility], last dose was given 03/05/2016 with stable disease.  CURRENT THERAPY::  1) Maintenance chemotherapy with Alimta 500 MG/M2 every 3 weeks status post 14 cycles. First dose was given 03/25/2016.  2) Zometa 4 mg IV every 12 week for metastatic bone disease.  INTERVAL HISTORY: Valerie Long 71 y.o. female returns to the clinic today for follow-up visit accompanied by her niece. The patient is feeling fine today with no specific complaints. She continues to tolerate her treatment with maintenance Alimta fairly well. She denied having any nausea, vomiting, diarrhea or constipation. She has no chest pain, shortness breath, cough or hemoptysis. She has no fever or chills. The patient denied having any headache or visual changes. She is here today for evaluation before starting cycle #15.  MEDICAL HISTORY: Past Medical History:  Diagnosis Date  . Adenocarcinoma of right lung, stage 4 ([place]) 2017  . Adenocarcinoma of right lung, stage 4 ([place])   . Anemia   . Arthritis   . Encounter for antineoplastic chemotherapy 03/17/2016  . Hypercholesterolemia   . Hypertension 06/18/2016  . Osteopenia   . 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 Pneumonia   . Shortness of breath dyspnea      ALLERGIES:  has No Known Allergies.  MEDICATIONS:  Current Outpatient Prescriptions  Medication Sig Dispense Refill  . acetaminophen (TYLENOL) 500 MG tablet Take 1,000 mg by mouth every 6 (six) hours as needed for moderate pain or headache.    . Calcium Carb-Cholecalciferol (CALCIUM 600/VITAMIN D3 PO) Take by mouth.    . Cholecalciferol (VITAMIN D3 PO) Take 1 tablet by mouth 2 (two) times daily.     . Cyanocobalamin (B-12 PO) Take 2 tablets by mouth daily.    Marland Kitchen dexamethasone (DECADRON) 4 MG tablet Take 4 tablets (16 mg total) by mouth 2 (two) times daily. Take 55m by mouth twice daily the day before, of, and after chemo (Patient taking differently: Take 4 mg by mouth 2 (two) times daily. Take 470mby mouth twice daily the day before, of, and after chemo) 30 tablet 2  . Fish Oil-Cholecalciferol (OMEGA-3 + VITAMIN D3 PO) Take 1 tablet by mouth daily.    . folic acid (FOLVITE) 1 MG tablet Take 1 tablet (1 mg total) by mouth daily. 90 tablet 0  . lidocaine-prilocaine (EMLA) cream Apply 1 application topically as needed. Apply 1-2 tsp over port site 1-2 hours prior to chemo. 30 g 0  . ondansetron (ZOFRAN) 8 MG tablet Take 8 mg by mouth every 8 (eight) hours as needed.    . Marland KitchenRESCRIPTION MEDICATION Inject 1 Dose as directed every 21 ( twenty-one) days. Chemo     No current facility-administered medications for this visit.     SURGICAL HISTORY:  Past Surgical History:  Procedure Laterality Date  . CESAREAN SECTION     myomectomy  . COLONOSCOPY W/ POLYPECTOMY    . IR GENERIC HISTORICAL  08/17/2016   IR US GUIDE VASC ACCESS RIGHT 08/17/2016 [name], MD [facility]  . IR GENERIC HISTORICAL  08/17/2016   IR FLUORO GUIDE PORT INSERTION RIGHT 08/17/2016 [name], MD [facility]  . MEDIASTINOSCOPY N/A 09/25/2015   Procedure: MEDIASTINOSCOPY;  Surgeon: Melrose Nakayama, MD;  Location: [facility];  Service: Thoracic;  Laterality: N/A;  . VIDEO BRONCHOSCOPY Bilateral 08/19/2015    Procedure: VIDEO BRONCHOSCOPY WITH FLUORO;  Surgeon: Rigoberto Noel, MD;  Location: [facility];  Service: Cardiopulmonary;  Laterality: Bilateral;  . VIDEO BRONCHOSCOPY N/A 09/08/2015   Procedure: VIDEO BRONCHOSCOPY WITH FLUORO;  Surgeon: Rigoberto Noel, MD;  Location: [place];  Service: Thoracic;  Laterality: N/A;  . VIDEO BRONCHOSCOPY WITH ENDOBRONCHIAL ULTRASOUND Right 09/08/2015   Procedure: ATTEMPTED VIDEO BRONCHOSCOPY ENDOBRONCHIAL ULTRASOUND  ;  Surgeon: Rigoberto Noel, MD;  Location: [place];  Service: Thoracic;  Laterality: Right;    REVIEW OF SYSTEMS:  A comprehensive review of systems was negative.   PHYSICAL EXAMINATION: General appearance: alert, cooperative and no distress Head: Normocephalic, without obvious abnormality, atraumatic Neck: no adenopathy, no JVD, supple, symmetrical, trachea midline and thyroid not enlarged, symmetric, no tenderness/mass/nodules Lymph nodes: Cervical, supraclavicular, and axillary nodes normal. Resp: clear to auscultation bilaterally Back: symmetric, no curvature. ROM normal. No CVA tenderness. Cardio: regular rate and rhythm, S1, S2 normal, no murmur, click, rub or gallop GI: soft, non-tender; bowel sounds normal; no masses,  no organomegaly Extremities: extremities normal, atraumatic, no cyanosis or edema  ECOG PERFORMANCE STATUS: 0 - Asymptomatic  Last menstrual period 06/09/1998.  LABORATORY DATA: Lab Results  Component Value Date   WBC 6.2 12/23/2016   HGB 12.2 12/23/2016   HCT 36.2 12/23/2016   MCV 98.6 12/23/2016   PLT 242 12/23/2016      Chemistry      Component Value Date/Time   NA 138 12/23/2016 1057   K 4.4 12/23/2016 1057   CL 107 09/25/2015 0805   CO2 27 12/23/2016 1057   BUN 19.4 12/23/2016 1057   CREATININE 1.3 (H) 12/23/2016 1057   GLU 170 01/23/2016      Component Value Date/Time   CALCIUM 10.0 12/23/2016 1057   ALKPHOS 65 12/23/2016 1057   AST 42 (H) 12/23/2016 1057   ALT 32 12/23/2016 1057   BILITOT 0.76  12/23/2016 1057       RADIOGRAPHIC STUDIES: No results found.  ASSESSMENT AND PLAN:  This is a very pleasant 72 years old Hispanic female with metastatic non-small cell lung cancer, adenocarcinoma status post induction systemic chemotherapy with carboplatin and Alimta and she is currently on maintenance treatment with single agent Alimta status post 13 cycles. The patient tolerated cycle #14 of her treatment fairly well with no significant adverse effects. I recommended for the patient to proceed with cycle #15 today as a scheduled. There was no clear evidence for disease progression on the recent CT scan of the chest, abdomen and pelvis. I discussed the scan results again with the patient today. I will see her back for follow-up visit in 3 weeks for evaluation before starting cycle #16 with repeat CT scan of the chest, abdomen and pelvis for restaging of her disease. She was advised to call immediately if she has any concerning symptoms in the interval. The patient voices understanding of current disease status and treatment options and is in  agreement with the current care plan. All questions were answered. The patient knows to call the clinic with any problems, questions or concerns. We can certainly see the patient much sooner if necessary. I spent 10 minutes counseling the patient face to face. The total time spent in the appointment was 15 minutes.  Disclaimer: This note was dictated with voice recognition software. Similar sounding words can inadvertently be transcribed and may not be corrected upon review.

## 2017-01-13 NOTE — Telephone Encounter (Signed)
Scheduled appt per 8/9 los - Gave patient AVS and calender per los.  

## 2017-01-13 NOTE — Patient Instructions (Addendum)
[place] Discharge Instructions for Patients Receiving Chemotherapy  Today you received the following chemotherapy agents Alimta  To help prevent nausea and vomiting after your treatment, we encourage you to take your nausea medication    If you develop nausea and vomiting that is not controlled by your nausea medication, call the clinic.   BELOW ARE SYMPTOMS THAT SHOULD BE REPORTED IMMEDIATELY:  *FEVER GREATER THAN 100.5 F  *CHILLS WITH OR WITHOUT FEVER  NAUSEA AND VOMITING THAT IS NOT CONTROLLED WITH YOUR NAUSEA MEDICATION  *UNUSUAL SHORTNESS OF BREATH  *UNUSUAL BRUISING OR BLEEDING  TENDERNESS IN MOUTH AND THROAT WITH OR WITHOUT PRESENCE OF ULCERS  *URINARY PROBLEMS  *BOWEL PROBLEMS  UNUSUAL RASH Items with * indicate a potential emergency and should be followed up as soon as possible.  Feel free to call the clinic you have any questions or concerns. The clinic phone number is [phone].  Please show the [place] at check-in to the Emergency Department and triage nurse.   Zoledronic Acid injection (Hypercalcemia, Oncology) What is this medicine? ZOLEDRONIC ACID (ZOE le dron ik AS id) lowers the amount of calcium loss from bone. It is used to treat too much calcium in your blood from cancer. It is also used to prevent complications of cancer that has spread to the bone. This medicine may be used for other purposes; ask your health care provider or pharmacist if you have questions. COMMON BRAND NAME(S): Zometa What should I tell my health care provider before I take this medicine? They need to know if you have any of these conditions: -aspirin-sensitive asthma -cancer, especially if you are receiving medicines used to treat cancer -dental disease or wear dentures -infection -kidney disease -receiving corticosteroids like dexamethasone or prednisone -an unusual or allergic reaction to zoledronic acid, other medicines, foods, dyes, or  preservatives -pregnant or trying to get pregnant -breast-feeding How should I use this medicine? This medicine is for infusion into a vein. It is given by a health care professional in a hospital or clinic setting. Talk to your pediatrician regarding the use of this medicine in children. Special care may be needed. Overdosage: If you think you have taken too much of this medicine contact a poison control center or emergency room at once. NOTE: This medicine is only for you. Do not share this medicine with others. What if I miss a dose? It is important not to miss your dose. Call your doctor or health care professional if you are unable to keep an appointment. What may interact with this medicine? -certain antibiotics given by injection -NSAIDs, medicines for pain and inflammation, like ibuprofen or naproxen -some diuretics like bumetanide, furosemide -teriparatide -thalidomide This list may not describe all possible interactions. Give your health care provider a list of all the medicines, herbs, non-prescription drugs, or dietary supplements you use. Also tell them if you smoke, drink alcohol, or use illegal drugs. Some items may interact with your medicine. What should I watch for while using this medicine? Visit your doctor or health care professional for regular checkups. It may be some time before you see the benefit from this medicine. Do not stop taking your medicine unless your doctor tells you to. Your doctor may order blood tests or other tests to see how you are doing. Women should inform their doctor if they wish to become pregnant or think they might be pregnant. There is a potential for serious side effects to an unborn child. Talk to your  health care professional or pharmacist for more information. You should make sure that you get enough calcium and vitamin D while you are taking this medicine. Discuss the foods you eat and the vitamins you take with your health care  professional. Some people who take this medicine have severe bone, joint, and/or muscle pain. This medicine may also increase your risk for jaw problems or a broken thigh bone. Tell your doctor right away if you have severe pain in your jaw, bones, joints, or muscles. Tell your doctor if you have any pain that does not go away or that gets worse. Tell your dentist and dental surgeon that you are taking this medicine. You should not have major dental surgery while on this medicine. See your dentist to have a dental exam and fix any dental problems before starting this medicine. Take good care of your teeth while on this medicine. Make sure you see your dentist for regular follow-up appointments. What side effects may I notice from receiving this medicine? Side effects that you should report to your doctor or health care professional as soon as possible: -allergic reactions like skin rash, itching or hives, swelling of the face, lips, or tongue -anxiety, confusion, or depression -breathing problems -changes in vision -eye pain -feeling faint or lightheaded, falls -jaw pain, especially after dental work -mouth sores -muscle cramps, stiffness, or weakness -redness, blistering, peeling or loosening of the skin, including inside the mouth -trouble passing urine or change in the amount of urine Side effects that usually do not require medical attention (report to your doctor or health care professional if they continue or are bothersome): -bone, joint, or muscle pain -constipation -diarrhea -fever -hair loss -irritation at site where injected -loss of appetite -nausea, vomiting -stomach upset -trouble sleeping -trouble swallowing -weak or tired This list may not describe all possible side effects. Call your doctor for medical advice about side effects. You may report side effects to FDA at 1-800-FDA-1088. Where should I keep my medicine? This drug is given in a hospital or clinic and will not  be stored at home. NOTE: This sheet is a summary. It may not cover all possible information. If you have questions about this medicine, talk to your doctor, pharmacist, or health care provider.  2018 Elsevier/Gold Standard (2013-10-20 14:19:39)   Cyanocobalamin, Vitamin B12 injection What is this medicine? CYANOCOBALAMIN (sye an oh koe BAL a min) is a man made form of vitamin B12. Vitamin B12 is used in the growth of healthy blood cells, nerve cells, and proteins in the body. It also helps with the metabolism of fats and carbohydrates. This medicine is used to treat people who can not absorb vitamin B12. This medicine may be used for other purposes; ask your health care provider or pharmacist if you have questions. COMMON BRAND NAME(S): B-12 Compliance Kit, B-12 Injection Kit, Cyomin, LA-12, Nutri-Twelve, Physicians EZ Use B-12, Primabalt What should I tell my health care provider before I take this medicine? They need to know if you have any of these conditions: -kidney disease -Leber's disease -megaloblastic anemia -an unusual or allergic reaction to cyanocobalamin, cobalt, other medicines, foods, dyes, or preservatives -pregnant or trying to get pregnant -breast-feeding How should I use this medicine? This medicine is injected into a muscle or deeply under the skin. It is usually given by a health care professional in a clinic or doctor's office. However, your doctor may teach you how to inject yourself. Follow all instructions. Talk to your pediatrician regarding the  use of this medicine in children. Special care may be needed. Overdosage: If you think you have taken too much of this medicine contact a poison control center or emergency room at once. NOTE: This medicine is only for you. Do not share this medicine with others. What if I miss a dose? If you are given your dose at a clinic or doctor's office, call to reschedule your appointment. If you give your own injections and you miss  a dose, take it as soon as you can. If it is almost time for your next dose, take only that dose. Do not take double or extra doses. What may interact with this medicine? -colchicine -heavy alcohol intake This list may not describe all possible interactions. Give your health care provider a list of all the medicines, herbs, non-prescription drugs, or dietary supplements you use. Also tell them if you smoke, drink alcohol, or use illegal drugs. Some items may interact with your medicine. What should I watch for while using this medicine? Visit your doctor or health care professional regularly. You may need blood work done while you are taking this medicine. You may need to follow a special diet. Talk to your doctor. Limit your alcohol intake and avoid smoking to get the best benefit. What side effects may I notice from receiving this medicine? Side effects that you should report to your doctor or health care professional as soon as possible: -allergic reactions like skin rash, itching or hives, swelling of the face, lips, or tongue -blue tint to skin -chest tightness, pain -difficulty breathing, wheezing -dizziness -red, swollen painful area on the leg Side effects that usually do not require medical attention (report to your doctor or health care professional if they continue or are bothersome): -diarrhea -headache This list may not describe all possible side effects. Call your doctor for medical advice about side effects. You may report side effects to FDA at 1-800-FDA-1088. Where should I keep my medicine? Keep out of the reach of children. Store at room temperature between 15 and 30 degrees C (59 and 85 degrees F). Protect from light. Throw away any unused medicine after the expiration date. NOTE: This sheet is a summary. It may not cover all possible information. If you have questions about this medicine, talk to your doctor, pharmacist, or health care provider.  2018 Elsevier/Gold  Standard (2007-09-04 22:10:20)

## 2017-01-15 ENCOUNTER — Encounter: Payer: Self-pay | Admitting: Internal Medicine

## 2017-02-01 ENCOUNTER — Ambulatory Visit (HOSPITAL_COMMUNITY)
Admission: RE | Admit: 2017-02-01 | Discharge: 2017-02-01 | Disposition: A | Discharge status: Home / Self Care | Payer: Medicare Other | Source: Ambulatory Visit | Attending: Internal Medicine | Admitting: Internal Medicine

## 2017-02-01 DIAGNOSIS — C3491 Malignant neoplasm of unspecified part of right bronchus or lung: Secondary | ICD-10-CM

## 2017-02-01 DIAGNOSIS — C7951 Secondary malignant neoplasm of bone: Secondary | ICD-10-CM | POA: Diagnosis not present

## 2017-02-01 DIAGNOSIS — R918 Other nonspecific abnormal finding of lung field: Secondary | ICD-10-CM | POA: Insufficient documentation

## 2017-02-01 DIAGNOSIS — C3431 Malignant neoplasm of lower lobe, right bronchus or lung: Secondary | ICD-10-CM | POA: Insufficient documentation

## 2017-02-01 DIAGNOSIS — Z5111 Encounter for antineoplastic chemotherapy: Secondary | ICD-10-CM

## 2017-02-01 MED ORDER — IOPAMIDOL (ISOVUE-300) INJECTION 61%
100.0000 mL | Freq: Once | INTRAVENOUS | Status: AC | PRN
  Administered 2017-02-01: 100 mL via INTRAVENOUS

## 2017-02-01 MED ORDER — IOPAMIDOL (ISOVUE-300) INJECTION 61%
INTRAVENOUS | Status: AC
  Filled 2017-02-01: qty 100

## 2017-02-03 ENCOUNTER — Encounter: Payer: Self-pay | Admitting: Internal Medicine

## 2017-02-03 ENCOUNTER — Other Ambulatory Visit (HOSPITAL_BASED_OUTPATIENT_CLINIC_OR_DEPARTMENT_OTHER): Payer: Medicare Other

## 2017-02-03 ENCOUNTER — Ambulatory Visit (HOSPITAL_BASED_OUTPATIENT_CLINIC_OR_DEPARTMENT_OTHER): Payer: Medicare Other

## 2017-02-03 ENCOUNTER — Ambulatory Visit (HOSPITAL_BASED_OUTPATIENT_CLINIC_OR_DEPARTMENT_OTHER): Payer: Medicare Other | Admitting: Internal Medicine

## 2017-02-03 VITALS — BP 142/83 | HR 71 | Temp 98.0°F | Resp 18 | Wt 161.6 lb

## 2017-02-03 DIAGNOSIS — I10 Essential (primary) hypertension: Secondary | ICD-10-CM | POA: Diagnosis not present

## 2017-02-03 DIAGNOSIS — M545 Low back pain: Secondary | ICD-10-CM | POA: Diagnosis not present

## 2017-02-03 DIAGNOSIS — C3431 Malignant neoplasm of lower lobe, right bronchus or lung: Secondary | ICD-10-CM

## 2017-02-03 DIAGNOSIS — C3491 Malignant neoplasm of unspecified part of right bronchus or lung: Secondary | ICD-10-CM

## 2017-02-03 DIAGNOSIS — C7951 Secondary malignant neoplasm of bone: Secondary | ICD-10-CM

## 2017-02-03 DIAGNOSIS — Z5111 Encounter for antineoplastic chemotherapy: Secondary | ICD-10-CM

## 2017-02-03 LAB — CBC WITH DIFFERENTIAL/PLATELET
BASO%: 0.6 % (ref 0.0–2.0)
Basophils Absolute: 0 10*3/uL (ref 0.0–0.1)
EOS%: 0.5 % (ref 0.0–7.0)
Eosinophils Absolute: 0 10*3/uL (ref 0.0–0.5)
HCT: 32.1 % — ABNORMAL LOW (ref 34.8–46.6)
HGB: 11.1 g/dL — ABNORMAL LOW (ref 11.6–15.9)
LYMPH%: 23.7 % (ref 14.0–49.7)
MCH: 34 pg (ref 25.1–34.0)
MCHC: 34.5 g/dL (ref 31.5–36.0)
MCV: 98.5 fL (ref 79.5–101.0)
MONO#: 0.7 10*3/uL (ref 0.1–0.9)
MONO%: 11.4 % (ref 0.0–14.0)
NEUT#: 4 10*3/uL (ref 1.5–6.5)
NEUT%: 63.8 % (ref 38.4–76.8)
Platelets: 223 10*3/uL (ref 145–400)
RBC: 3.26 10*6/uL — ABNORMAL LOW (ref 3.70–5.45)
RDW: 15.2 % — ABNORMAL HIGH (ref 11.2–14.5)
WBC: 6.3 10*3/uL (ref 3.9–10.3)
lymph#: 1.5 10*3/uL (ref 0.9–3.3)

## 2017-02-03 LAB — COMPREHENSIVE METABOLIC PANEL
ALT: 22 U/L (ref 0–55)
AST: 33 U/L (ref 5–34)
Albumin: 3.6 g/dL (ref 3.5–5.0)
Alkaline Phosphatase: 65 U/L (ref 40–150)
Anion Gap: 6 mEq/L (ref 3–11)
BUN: 21.4 mg/dL (ref 7.0–26.0)
CO2: 29 mEq/L (ref 22–29)
Calcium: 10.1 mg/dL (ref 8.4–10.4)
Chloride: 102 mEq/L (ref 98–109)
Creatinine: 1.2 mg/dL — ABNORMAL HIGH (ref 0.6–1.1)
EGFR: 45 mL/min/{1.73_m2} — ABNORMAL LOW (ref >90)
Glucose: 80 mg/dl (ref 70–140)
Potassium: 4.3 mEq/L (ref 3.5–5.1)
Sodium: 137 mEq/L (ref 136–145)
Total Bilirubin: 0.81 mg/dL (ref 0.20–1.20)
Total Protein: 7 g/dL (ref 6.4–8.3)

## 2017-02-03 MED ORDER — HEPARIN SOD (PORK) LOCK FLUSH 100 UNIT/ML IV SOLN
500.0000 [IU] | Freq: Once | INTRAVENOUS | Status: AC | PRN
  Administered 2017-02-03: 500 [IU]
  Filled 2017-02-03: qty 5

## 2017-02-03 MED ORDER — CYANOCOBALAMIN 1000 MCG/ML IJ SOLN
INTRAMUSCULAR | Status: AC
  Filled 2017-02-03: qty 1

## 2017-02-03 MED ORDER — SODIUM CHLORIDE 0.9 % IV SOLN
Freq: Once | INTRAVENOUS | Status: AC
  Administered 2017-02-03: 12:00:00 via INTRAVENOUS

## 2017-02-03 MED ORDER — PROCHLORPERAZINE MALEATE 10 MG PO TABS
10.0000 mg | ORAL_TABLET | Freq: Once | ORAL | Status: AC
  Administered 2017-02-03: 10 mg via ORAL

## 2017-02-03 MED ORDER — SODIUM CHLORIDE 0.9% FLUSH
10.0000 mL | INTRAVENOUS | Status: DC | PRN
  Administered 2017-02-03: 10 mL
  Filled 2017-02-03: qty 10

## 2017-02-03 MED ORDER — PROCHLORPERAZINE MALEATE 10 MG PO TABS
ORAL_TABLET | ORAL | Status: AC
  Filled 2017-02-03: qty 1

## 2017-02-03 MED ORDER — CYANOCOBALAMIN 1000 MCG/ML IJ SOLN
1000.0000 ug | Freq: Once | INTRAMUSCULAR | Status: AC
  Administered 2017-02-03: 1000 ug via INTRAMUSCULAR

## 2017-02-03 MED ORDER — SODIUM CHLORIDE 0.9 % IV SOLN
525.0000 mg/m2 | Freq: Once | INTRAVENOUS | Status: AC
  Administered 2017-02-03: 1000 mg via INTRAVENOUS
  Filled 2017-02-03: qty 40

## 2017-02-03 NOTE — Patient Instructions (Signed)
[place] Discharge Instructions for Patients Receiving Chemotherapy  Today you received the following chemotherapy agents ALIMTA To help prevent nausea and vomiting after your treatment, we encourage you to take your nausea medication   If you develop nausea and vomiting that is not controlled by your nausea medication, call the clinic.   BELOW ARE SYMPTOMS THAT SHOULD BE REPORTED IMMEDIATELY:  *FEVER GREATER THAN 100.5 F  *CHILLS WITH OR WITHOUT FEVER  NAUSEA AND VOMITING THAT IS NOT CONTROLLED WITH YOUR NAUSEA MEDICATION  *UNUSUAL SHORTNESS OF BREATH  *UNUSUAL BRUISING OR BLEEDING  TENDERNESS IN MOUTH AND THROAT WITH OR WITHOUT PRESENCE OF ULCERS  *URINARY PROBLEMS  *BOWEL PROBLEMS  UNUSUAL RASH Items with * indicate a potential emergency and should be followed up as soon as possible.  Feel free to call the clinic you have any questions or concerns. The clinic phone number is [phone].  Please show the [place] at check-in to the Emergency Department and triage nurse.

## 2017-02-03 NOTE — Progress Notes (Signed)
    [facility] [phone]   [phone]  OFFICE PROGRESS NOTE  Paz, Jose E, MD [address] [place] [address]  DIAGNOSIS: Stage IV (T1b, N2, M1b) non-small cell lung cancer, adenocarcinoma diagnosed in March 2017 and presented with right lower lobe lung nodule in addition to mediastinal lymphadenopathy and metastatic bone lesions.  Molecular studies: PDL 1 TPS  <1%.   Foundation One Studies: Positive for ERBB2 [id number]. Negative for EGFR, KRAS, ALK, BRAF, MET, RET and ROS1.  PRIOR THERAPY: Induction systemic chemotherapy with carboplatin for AUC of 5 and Alimta 500 MG/M2 every 3 weeks is status post 6 cycles at the [facility], last dose was given 03/05/2016 with stable disease.  CURRENT THERAPY::  1) Maintenance chemotherapy with Alimta 500 MG/M2 every 3 weeks status post 14 cycles. First dose was given 03/25/2016.  2) Zometa 4 mg IV every 12 week for metastatic bone disease.  INTERVAL HISTORY: Valerie Long 71 y.o. female returns to the clinic today for follow-up visit accompanied by her sister. The patient is feeling fine today with no specific complaints except for intermittent low back pain. She has been undergoing treatment with maintenance systemic chemotherapy with single agent Alimta status post 15 cycles and has been tolerating this treatment fairly well. She denied having any chest pain, shortness of breath, cough or hemoptysis. She denied having any fever or chills. She has no nausea, vomiting, diarrhea or constipation. She denied having any headache or visual changes. She had repeat CT scan of the chest, abdomen and pelvis performed recently and she is here for evaluation and discussion of her scan results.  MEDICAL HISTORY: Past Medical History:  Diagnosis Date  . Adenocarcinoma of right lung, stage 4 (HCC) 2017  . Adenocarcinoma of right lung, stage 4 (HCC)   . Anemia   . Arthritis   . Encounter  for antineoplastic chemotherapy 03/17/2016  . Hypercholesterolemia   . Hypertension 06/18/2016  . Osteopenia   . Pelvic kidney    Left. On CT in [place]  . Pneumonia   . Shortness of breath dyspnea     ALLERGIES:  has No Known Allergies.  MEDICATIONS:  Current Outpatient Prescriptions  Medication Sig Dispense Refill  . acetaminophen (TYLENOL) 500 MG tablet Take 1,000 mg by mouth every 6 (six) hours as needed for moderate pain or headache.    . Calcium Carb-Cholecalciferol (CALCIUM 600/VITAMIN D3 PO) Take by mouth.    . Cholecalciferol (VITAMIN D3 PO) Take 1 tablet by mouth 2 (two) times daily.     . Cyanocobalamin (B-12 PO) Take 2 tablets by mouth daily.    . dexamethasone (DECADRON) 4 MG tablet Take 4 tablets (16 mg total) by mouth 2 (two) times daily. Take 4mg by mouth twice daily the day before, of, and after chemo (Patient taking differently: Take 4 mg by mouth 2 (two) times daily. Take 4mg by mouth twice daily the day before, of, and after chemo) 30 tablet 2  . Fish Oil-Cholecalciferol (OMEGA-3 + VITAMIN D3 PO) Take 1 tablet by mouth daily.    . folic acid (FOLVITE) 1 MG tablet Take 1 tablet (1 mg total) by mouth daily. 90 tablet 0  . lidocaine-prilocaine (EMLA) cream Apply 1 application topically as needed. Apply 1-2 tsp over port site 1-2 hours prior to chemo. 30 g 0  . ondansetron (ZOFRAN) 8 MG tablet Take 8 mg by mouth every 8 (eight) hours as needed.    .   PRESCRIPTION MEDICATION Inject 1 Dose as directed every 21 ( twenty-one) days. Chemo     No current facility-administered medications for this visit.     SURGICAL HISTORY:  Past Surgical History:  Procedure Laterality Date  . CESAREAN SECTION     myomectomy  . COLONOSCOPY W/ POLYPECTOMY    . IR GENERIC HISTORICAL  08/17/2016   IR US GUIDE VASC ACCESS RIGHT 08/17/2016 [name], MD [facility]  . IR GENERIC HISTORICAL  08/17/2016   IR FLUORO GUIDE PORT INSERTION RIGHT 08/17/2016 [name], MD  [facility]  . MEDIASTINOSCOPY N/A 09/25/2015   Procedure: MEDIASTINOSCOPY;  Surgeon: Melrose Nakayama, MD;  Location: [facility];  Service: Thoracic;  Laterality: N/A;  . VIDEO BRONCHOSCOPY Bilateral 08/19/2015   Procedure: VIDEO BRONCHOSCOPY WITH FLUORO;  Surgeon: Rigoberto Noel, MD;  Location: [facility];  Service: Cardiopulmonary;  Laterality: Bilateral;  . VIDEO BRONCHOSCOPY N/A 09/08/2015   Procedure: VIDEO BRONCHOSCOPY WITH FLUORO;  Surgeon: Rigoberto Noel, MD;  Location: [facility];  Service: Thoracic;  Laterality: N/A;  . VIDEO BRONCHOSCOPY WITH ENDOBRONCHIAL ULTRASOUND Right 09/08/2015   Procedure: ATTEMPTED VIDEO BRONCHOSCOPY ENDOBRONCHIAL ULTRASOUND  ;  Surgeon: Rigoberto Noel, MD;  Location: [facility];  Service: Thoracic;  Laterality: Right;    REVIEW OF SYSTEMS:  Constitutional: negative Eyes: negative Ears, nose, mouth, throat, and face: negative Respiratory: negative Cardiovascular: negative Gastrointestinal: negative Genitourinary:negative Integument/breast: negative Hematologic/lymphatic: negative Musculoskeletal:positive for back pain Neurological: negative Behavioral/Psych: negative Endocrine: negative Allergic/Immunologic: negative   PHYSICAL EXAMINATION: General appearance: alert, cooperative and no distress Head: Normocephalic, without obvious abnormality, atraumatic Neck: no adenopathy, no JVD, supple, symmetrical, trachea midline and thyroid not enlarged, symmetric, no tenderness/mass/nodules Lymph nodes: Cervical, supraclavicular, and axillary nodes normal. Resp: clear to auscultation bilaterally Back: symmetric, no curvature. ROM normal. No CVA tenderness. Cardio: regular rate and rhythm, S1, S2 normal, no murmur, click, rub or gallop GI: soft, non-tender; bowel sounds normal; no masses,  no organomegaly Extremities: extremities normal, atraumatic, no cyanosis or edema Neurologic: Alert and oriented X 3, normal strength and tone. Normal symmetric reflexes. Normal  coordination and gait  ECOG PERFORMANCE STATUS: 0 - Asymptomatic  Blood pressure (!) 142/83, pulse 71, temperature 98 F (36.7 C), temperature source Oral, resp. rate 18, weight 161 lb 9.6 oz (73.3 kg), last menstrual period 06/09/1998, SpO2 100 %.  LABORATORY DATA: Lab Results  Component Value Date   WBC 6.3 02/03/2017   HGB 11.1 (L) 02/03/2017   HCT 32.1 (L) 02/03/2017   MCV 98.5 02/03/2017   PLT 223 02/03/2017      Chemistry      Component Value Date/Time   NA 137 02/03/2017 1052   K 4.3 02/03/2017 1052   CL 107 09/25/2015 0805   CO2 29 02/03/2017 1052   BUN 21.4 02/03/2017 1052   CREATININE 1.2 (H) 02/03/2017 1052   GLU 170 01/23/2016      Component Value Date/Time   CALCIUM 10.1 02/03/2017 1052   ALKPHOS 65 02/03/2017 1052   AST 33 02/03/2017 1052   ALT 22 02/03/2017 1052   BILITOT 0.81 02/03/2017 1052       RADIOGRAPHIC STUDIES: Ct Chest W Contrast  Result Date: 02/02/2017 CLINICAL DATA:  Stage IV right lower lobe lung adenocarcinoma diagnosed March 2017 status post induction systemic chemotherapy with ongoing maintenance chemotherapy. Restaging. EXAM: CT CHEST, ABDOMEN, AND PELVIS WITH CONTRAST TECHNIQUE: Multidetector CT imaging of the chest, abdomen and pelvis was performed following the standard protocol during bolus administration of intravenous contrast.  CONTRAST:  100mL ISOVUE-300 IOPAMIDOL (ISOVUE-300) INJECTION 61% COMPARISON:  12/01/2016 CT chest, abdomen and pelvis. FINDINGS: CT CHEST FINDINGS Cardiovascular: Normal heart size. No significant pericardial fluid/thickening. Right internal jugular MediPort terminates at the cavoatrial junction. Mildly atherosclerotic nonaneurysmal thoracic aorta. Normal caliber pulmonary arteries. No central pulmonary emboli. Mediastinum/Nodes: No discrete thyroid nodules. Unremarkable esophagus. No pathologically enlarged axillary, mediastinal or hilar lymph nodes. Lungs/Pleura: No pneumothorax. No pleural effusion. Anterior  right lower lobe 2.3 x 1.3 cm pulmonary nodule (series 6/image 78), previously 2.3 x 1.4 cm using similar measurement technique, stable. Stable parenchymal band in the anterior right lower lobe peripheral to the dominant pulmonary nodule. Several (at least 10) scattered small pulmonary nodules throughout both lungs measuring up to 5 mm in the right upper lobe (series 6/ image 39) are all stable. No new significant pulmonary nodules. Mild patchy subpleural reticulation and ground-glass attenuation in the left lower lobe with associated small parenchymal bands, not appreciably changed. Musculoskeletal: Innumerable small sclerotic lesions scattered throughout the thoracic spine, sternum, bilateral ribs and bilateral shoulder girdles have not appreciably changed in size or number. No appreciable new focal osseous lesions. Mild thoracic spondylosis. CT ABDOMEN PELVIS FINDINGS Hepatobiliary: Normal liver size. Two scattered left liver lobe subcentimeter hypodense lesions are too small to characterize and stable since at least 09/28/2016. No new liver lesions. Cholelithiasis. No biliary ductal dilatation. Pancreas: Normal, with no mass or duct dilation. Spleen: Normal size. No mass. Adrenals/Urinary Tract: Normal adrenals. 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 Orthotopic right kidney. Stable mild pelvocaliectasis in the left renal collecting system. No right hydronephrosis. No renal masses. Normal bladder. Stomach/Bowel: Grossly normal stomach. Stable findings of congenital bowel malrotation with the small bowel loops entirely in the right abdomen and with the colon predominantly in the mid and left abdomen. Normal caliber small bowel loops with no small bowel wall thickening. Normal appendix. No large bowel wall thickening or pericolonic fat stranding. Vascular/Lymphatic: Nonaneurysmal abdominal aorta. Patent portal, splenic, hepatic and renal veins. Stable top-normal size 0.9 cm left external iliac node  (series 2/ image 103). No pathologically enlarged lymph nodes in the abdomen or pelvis. Reproductive: No adnexal masses. Stable elongated thin midline uterus. Other: No pneumoperitoneum, ascites or focal fluid collection. Musculoskeletal: Innumerable small sclerotic osseous lesions scattered throughout the lumbar spine, sacrum and bilateral pelvic girdle, not appreciably changed in size or number. No appreciable new focal osseous lesions. Moderate lumbar spondylosis. IMPRESSION: 1. No new or progressive metastatic disease in the chest, abdomen or pelvis. 2. Innumerable small sclerotic osseous metastases throughout the axial skeleton appear stable. 3. Primary right lower lobe lung neoplasm is stable. Several scattered subcentimeter pulmonary nodules are stable . 4. No pathologically enlarged lymph nodes. 5. Several chronic findings as above. Electronically Signed   By: Jason A Poff M.D.   On: 02/02/2017 09:17   Ct Abdomen Pelvis W Contrast  Result Date: 02/02/2017 CLINICAL DATA:  Stage IV right lower lobe lung adenocarcinoma diagnosed March 2017 status post induction systemic chemotherapy with ongoing maintenance chemotherapy. Restaging. EXAM: CT CHEST, ABDOMEN, AND PELVIS WITH CONTRAST TECHNIQUE: Multidetector CT imaging of the chest, abdomen and pelvis was performed following the standard protocol during bolus administration of intravenous contrast. CONTRAST:  100mL ISOVUE-300 IOPAMIDOL (ISOVUE-300) INJECTION 61% COMPARISON:  12/01/2016 CT chest, abdomen and pelvis. FINDINGS: CT CHEST FINDINGS Cardiovascular: Normal heart size. No significant pericardial fluid/thickening. Right internal jugular MediPort terminates at the cavoatrial junction. Mildly atherosclerotic nonaneurysmal thoracic aorta. Normal caliber pulmonary arteries. No central pulmonary emboli. Mediastinum/Nodes: No   discrete thyroid nodules. Unremarkable esophagus. No pathologically enlarged axillary, mediastinal or hilar lymph nodes. Lungs/Pleura:  No pneumothorax. No pleural effusion. Anterior right lower lobe 2.3 x 1.3 cm pulmonary nodule (series 6/image 78), previously 2.3 x 1.4 cm using similar measurement technique, stable. Stable parenchymal band in the anterior right lower lobe peripheral to the dominant pulmonary nodule. Several (at least 10) scattered small pulmonary nodules throughout both lungs measuring up to 5 mm in the right upper lobe (series 6/ image 39) are all stable. No new significant pulmonary nodules. Mild patchy subpleural reticulation and ground-glass attenuation in the left lower lobe with associated small parenchymal bands, not appreciably changed. Musculoskeletal: Innumerable small sclerotic lesions scattered throughout the thoracic spine, sternum, bilateral ribs and bilateral shoulder girdles have not appreciably changed in size or number. No appreciable new focal osseous lesions. Mild thoracic spondylosis. CT ABDOMEN PELVIS FINDINGS Hepatobiliary: Normal liver size. Two scattered left liver lobe subcentimeter hypodense lesions are too small to characterize and stable since at least 09/28/2016. No new liver lesions. Cholelithiasis. No biliary ductal dilatation. Pancreas: Normal, with no mass or duct dilation. Spleen: Normal size. No mass. Adrenals/Urinary Tract: Normal adrenals. 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 Orthotopic right kidney. Stable mild pelvocaliectasis in the left renal collecting system. No right hydronephrosis. No renal masses. Normal bladder. Stomach/Bowel: Grossly normal stomach. Stable findings of congenital bowel malrotation with the small bowel loops entirely in the right abdomen and with the colon predominantly in the mid and left abdomen. Normal caliber small bowel loops with no small bowel wall thickening. Normal appendix. No large bowel wall thickening or pericolonic fat stranding. Vascular/Lymphatic: Nonaneurysmal abdominal aorta. Patent portal, splenic, hepatic and renal veins. Stable  top-normal size 0.9 cm left external iliac node (series 2/ image 103). No pathologically enlarged lymph nodes in the abdomen or pelvis. Reproductive: No adnexal masses. Stable elongated thin midline uterus. Other: No pneumoperitoneum, ascites or focal fluid collection. Musculoskeletal: Innumerable small sclerotic osseous lesions scattered throughout the lumbar spine, sacrum and bilateral pelvic girdle, not appreciably changed in size or number. No appreciable new focal osseous lesions. Moderate lumbar spondylosis. IMPRESSION: 1. No new or progressive metastatic disease in the chest, abdomen or pelvis. 2. Innumerable small sclerotic osseous metastases throughout the axial skeleton appear stable. 3. Primary right lower lobe lung neoplasm is stable. Several scattered subcentimeter pulmonary nodules are stable . 4. No pathologically enlarged lymph nodes. 5. Several chronic findings as above. Electronically Signed   By: Jason A Poff M.D.   On: 02/02/2017 09:17    ASSESSMENT AND PLAN:  This is a very pleasant 71 years old Hispanic female with metastatic non-small cell lung cancer, adenocarcinoma status post induction systemic chemotherapy with carboplatin and Alimta and she is currently on maintenance treatment with single agent Alimta status post 15 cycles. The patient is tolerating the treatment well. She had repeat CT scan of the chest, abdomen and pelvis performed recently. I personally and independently reviewed the scan images and discuss the results and showed the images to the patient and her sister.  Her scan showed no evidence for disease progression. I recommended for the patient to continue her current treatment with maintenance Alimta and she will proceed with cycle #16 today. For the low back pain, this could be secondary to the sclerotic lesions in the spine versus osteoarthritis. I gave the patient the option of referral to radiation oncology for consideration of palliative radiotherapy to the  metastatic disease in the spine but she declined this option at   this point. I will see her back for follow-up visit in 3 weeks for evaluation before starting cycle #17. For hypertension, the patient was advised to monitor her blood pressure closely at home and to consult with her primary care physician if it stayed elevated. She was advised to call immediately if she has any concerning symptoms in the interval. The patient voices understanding of current disease status and treatment options and is in agreement with the current care plan. All questions were answered. The patient knows to call the clinic with any problems, questions or concerns. We can certainly see the patient much sooner if necessary.  Disclaimer: This note was dictated with voice recognition software. Similar sounding words can inadvertently be transcribed and may not be corrected upon review.

## 2017-02-04 ENCOUNTER — Telehealth: Payer: Self-pay

## 2017-02-04 NOTE — Telephone Encounter (Signed)
Left voice message concerning upcoming appointment for 10/1. Per los

## 2017-02-24 ENCOUNTER — Ambulatory Visit (HOSPITAL_BASED_OUTPATIENT_CLINIC_OR_DEPARTMENT_OTHER): Payer: Medicare Other

## 2017-02-24 ENCOUNTER — Encounter: Payer: Self-pay | Admitting: Internal Medicine

## 2017-02-24 ENCOUNTER — Other Ambulatory Visit (HOSPITAL_BASED_OUTPATIENT_CLINIC_OR_DEPARTMENT_OTHER): Payer: Medicare Other

## 2017-02-24 ENCOUNTER — Ambulatory Visit (HOSPITAL_BASED_OUTPATIENT_CLINIC_OR_DEPARTMENT_OTHER): Payer: Medicare Other | Admitting: Internal Medicine

## 2017-02-24 VITALS — BP 128/73 | HR 73 | Temp 97.8°F | Resp 18 | Wt 161.0 lb

## 2017-02-24 DIAGNOSIS — Z5111 Encounter for antineoplastic chemotherapy: Secondary | ICD-10-CM

## 2017-02-24 DIAGNOSIS — T451X5A Adverse effect of antineoplastic and immunosuppressive drugs, initial encounter: Secondary | ICD-10-CM

## 2017-02-24 DIAGNOSIS — D6481 Anemia due to antineoplastic chemotherapy: Secondary | ICD-10-CM | POA: Diagnosis not present

## 2017-02-24 DIAGNOSIS — C7951 Secondary malignant neoplasm of bone: Secondary | ICD-10-CM

## 2017-02-24 DIAGNOSIS — C3431 Malignant neoplasm of lower lobe, right bronchus or lung: Secondary | ICD-10-CM | POA: Diagnosis not present

## 2017-02-24 DIAGNOSIS — G893 Neoplasm related pain (acute) (chronic): Secondary | ICD-10-CM | POA: Diagnosis not present

## 2017-02-24 DIAGNOSIS — C3491 Malignant neoplasm of unspecified part of right bronchus or lung: Secondary | ICD-10-CM

## 2017-02-24 DIAGNOSIS — Z95828 Presence of other vascular implants and grafts: Secondary | ICD-10-CM

## 2017-02-24 HISTORY — DX: Secondary malignant neoplasm of bone: C79.51

## 2017-02-24 LAB — CBC WITH DIFFERENTIAL/PLATELET
BASO%: 0.2 % (ref 0.0–2.0)
Basophils Absolute: 0 10*3/uL (ref 0.0–0.1)
EOS%: 0.1 % (ref 0.0–7.0)
Eosinophils Absolute: 0 10*3/uL (ref 0.0–0.5)
HCT: 32.7 % — ABNORMAL LOW (ref 34.8–46.6)
HGB: 11.1 g/dL — ABNORMAL LOW (ref 11.6–15.9)
LYMPH%: 11.5 % — ABNORMAL LOW (ref 14.0–49.7)
MCH: 33.5 pg (ref 25.1–34.0)
MCHC: 34.1 g/dL (ref 31.5–36.0)
MCV: 98.4 fL (ref 79.5–101.0)
MONO#: 0.8 10*3/uL (ref 0.1–0.9)
MONO%: 9.7 % (ref 0.0–14.0)
NEUT#: 6.2 10*3/uL (ref 1.5–6.5)
NEUT%: 78.5 % — ABNORMAL HIGH (ref 38.4–76.8)
Platelets: 202 10*3/uL (ref 145–400)
RBC: 3.32 10*6/uL — ABNORMAL LOW (ref 3.70–5.45)
RDW: 14.6 % — ABNORMAL HIGH (ref 11.2–14.5)
WBC: 7.9 10*3/uL (ref 3.9–10.3)
lymph#: 0.9 10*3/uL (ref 0.9–3.3)

## 2017-02-24 LAB — COMPREHENSIVE METABOLIC PANEL
ALT: 22 U/L (ref 0–55)
AST: 31 U/L (ref 5–34)
Albumin: 3.6 g/dL (ref 3.5–5.0)
Alkaline Phosphatase: 62 U/L (ref 40–150)
Anion Gap: 8 mEq/L (ref 3–11)
BUN: 19.5 mg/dL (ref 7.0–26.0)
CO2: 27 mEq/L (ref 22–29)
Calcium: 9.9 mg/dL (ref 8.4–10.4)
Chloride: 105 mEq/L (ref 98–109)
Creatinine: 1.1 mg/dL (ref 0.6–1.1)
EGFR: 52 mL/min/{1.73_m2} — ABNORMAL LOW (ref >90)
Glucose: 107 mg/dl (ref 70–140)
Potassium: 4.1 mEq/L (ref 3.5–5.1)
Sodium: 140 mEq/L (ref 136–145)
Total Bilirubin: 0.75 mg/dL (ref 0.20–1.20)
Total Protein: 7.2 g/dL (ref 6.4–8.3)

## 2017-02-24 MED ORDER — LIDOCAINE-PRILOCAINE 2.5-2.5 % EX CREA: 1.0000 "application " | TOPICAL_CREAM | CUTANEOUS | 0 refills | Status: DC | PRN

## 2017-02-24 MED ORDER — DEXAMETHASONE 4 MG PO TABS: 4.0000 mg | ORAL_TABLET | Freq: Two times a day (BID) | ORAL | 0 refills | Status: DC

## 2017-02-24 MED ORDER — PROCHLORPERAZINE MALEATE 10 MG PO TABS
10.0000 mg | ORAL_TABLET | Freq: Once | ORAL | Status: AC
  Administered 2017-02-24: 10 mg via ORAL

## 2017-02-24 MED ORDER — PROCHLORPERAZINE MALEATE 10 MG PO TABS
ORAL_TABLET | ORAL | Status: AC
  Filled 2017-02-24: qty 1

## 2017-02-24 MED ORDER — SODIUM CHLORIDE 0.9 % IV SOLN
500.0000 mg/m2 | Freq: Once | INTRAVENOUS | Status: AC
  Administered 2017-02-24: 950 mg via INTRAVENOUS
  Filled 2017-02-24: qty 38

## 2017-02-24 MED ORDER — SODIUM CHLORIDE 0.9% FLUSH
10.0000 mL | INTRAVENOUS | Status: DC | PRN
  Administered 2017-02-24: 10 mL
  Filled 2017-02-24: qty 10

## 2017-02-24 MED ORDER — SODIUM CHLORIDE 0.9 % IV SOLN
Freq: Once | INTRAVENOUS | Status: AC
  Administered 2017-02-24: 13:00:00 via INTRAVENOUS

## 2017-02-24 MED ORDER — HEPARIN SOD (PORK) LOCK FLUSH 100 UNIT/ML IV SOLN
500.0000 [IU] | Freq: Once | INTRAVENOUS | Status: AC | PRN
  Administered 2017-02-24: 500 [IU]
  Filled 2017-02-24: qty 5

## 2017-02-24 NOTE — Progress Notes (Signed)
[place] [phone]   Fax:([phone]) [place], MD [place] [address] [place] [address]  DIAGNOSIS: Stage IV (T1b, N2, M1b) non-small cell lung cancer, adenocarcinoma diagnosed in March 2017 and presented with right lower lobe lung nodule in addition to mediastinal lymphadenopathy and metastatic bone lesions.  Molecular studies: PDL 1 TPS  <1%.   [facility] One Studies: Positive for ERBB2 [id number]. Negative for EGFR, KRAS, ALK, BRAF, MET, RET and ROS1.  PRIOR THERAPY: Induction systemic chemotherapy with carboplatin for AUC of 5 and Alimta 500 MG/M2 every 3 weeks is status post 6 cycles at the [facility], last dose was given 03/05/2016 with stable disease.  CURRENT THERAPY::  1)  Maintenance systemic chemotherapy with Alimta 500 MG/M2 every 3 weeks status post 16 cycles. First dose was given 03/25/2016.  2) Zometa 4 mg IV every 12 week for metastatic bone disease.  INTERVAL HIST Valerie Long 71 y.o. female returns to the clinic  Today for follow-up visit accompanied by her sister. The patient continues to tolerate her treatmentwith maintenance Alimta fairly well.  She denied having any complaints except for persistent low back pain. She denied having any chest pain, shortness of breath, cough or hemoptysis. She denied having any fever or chills. She has no nausea, vomiting, diarrhea or constipation. She is here today for evaluation before starting cycle #17.    MEDICAL HISTORY: Past Medical History:  Diagnosis Date  . Adenocarcinoma of right lung, stage 4 ([place]) 2017  . Adenocarcinoma of right lung, stage 4 ([place])   . Anemia   . Arthritis   . Encounter for antineoplastic chemotherapy 03/17/2016  . Hypercholesterolemia   . Hypertension 06/18/2016  . Osteopenia   . 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 Pneumonia   . Shortness of breath dyspnea     ALLERGIES:  has No  Known Allergies.  MEDICATIONS:  Current Outpatient Prescriptions  Medication Sig Dispense Refill  . acetaminophen (TYLENOL) 500 MG tablet Take 1,000 mg by mouth every 6 (six) hours as needed for moderate pain or headache.    . Calcium Carb-Cholecalciferol (CALCIUM 600/VITAMIN D3 PO) Take by mouth.    . Cholecalciferol (VITAMIN D3 PO) Take 1 tablet by mouth 2 (two) times daily.     . Cyanocobalamin (B-12 PO) Take 2 tablets by mouth daily.    Marland Kitchen dexamethasone (DECADRON) 4 MG tablet Take 4 tablets (16 mg total) by mouth 2 (two) times daily. Take 61m by mouth twice daily the day before, of, and after chemo (Patient taking differently: Take 4 mg by mouth 2 (two) times daily. Take 444mby mouth twice daily the day before, of, and after chemo) 30 tablet 2  . Fish Oil-Cholecalciferol (OMEGA-3 + VITAMIN D3 PO) Take 1 tablet by mouth daily.    . folic acid (FOLVITE) 1 MG tablet Take 1 tablet (1 mg total) by mouth daily. 90 tablet 0  . lidocaine-prilocaine (EMLA) cream Apply 1 application topically as needed. Apply 1-2 tsp over port site 1-2 hours prior to chemo. 30 g 0  . ondansetron (ZOFRAN) 8 MG tablet Take 8 mg by mouth every 8 (eight) hours as needed.    . Marland KitchenRESCRIPTION MEDICATION Inject 1 Dose as directed every 21 ( twenty-one) days. Chemo     No current facility-administered medications for this visit.     SURGICAL HISTORY:  Past Surgical History:  Procedure Laterality Date  . CESAREAN SECTION     myomectomy  . COLONOSCOPY W/ POLYPECTOMY    . IR GENERIC HISTORICAL  08/17/2016   IR US GUIDE VASC ACCESS RIGHT 08/17/2016 [name], MD [facility]  . IR GENERIC HISTORICAL  08/17/2016   IR FLUORO GUIDE PORT INSERTION RIGHT 08/17/2016 [name], MD [facility]  . MEDIASTINOSCOPY N/A 09/25/2015   Procedure: MEDIASTINOSCOPY;  Surgeon: Melrose Nakayama, MD;  Location: [facility];  Service: Thoracic;  Laterality: N/A;  . VIDEO BRONCHOSCOPY Bilateral 08/19/2015   Procedure: VIDEO  BRONCHOSCOPY WITH FLUORO;  Surgeon: Rigoberto Noel, MD;  Location: [facility];  Service: Cardiopulmonary;  Laterality: Bilateral;  . VIDEO BRONCHOSCOPY N/A 09/08/2015   Procedure: VIDEO BRONCHOSCOPY WITH FLUORO;  Surgeon: Rigoberto Noel, MD;  Location: [facility];  Service: Thoracic;  Laterality: N/A;  . VIDEO BRONCHOSCOPY WITH ENDOBRONCHIAL ULTRASOUND Right 09/08/2015   Procedure: ATTEMPTED VIDEO BRONCHOSCOPY ENDOBRONCHIAL ULTRASOUND  ;  Surgeon: Rigoberto Noel, MD;  Location: [facility];  Service: Thoracic;  Laterality: Right;    REVIEW OF SYSTEMS:  A comprehensive review of systems was negative except for: Musculoskeletal: positive for back pain   PHYSICAL EXAMINATION: General appearance: alert, cooperative and no distress Head: Normocephalic, without obvious abnormality, atraumatic Neck: no adenopathy, no JVD, supple, symmetrical, trachea midline and thyroid not enlarged, symmetric, no tenderness/mass/nodules Lymph nodes: Cervical, supraclavicular, and axillary nodes normal. Resp: clear to auscultation bilaterally Back: symmetric, no curvature. ROM normal. No CVA tenderness. Cardio: regular rate and rhythm, S1, S2 normal, no murmur, click, rub or gallop GI: soft, non-tender; bowel sounds normal; no masses,  no organomegaly Extremities: extremities normal, atraumatic, no cyanosis or edema  ECOG PERFORMANCE STATUS: 0 - Asymptomatic  Blood pressure 128/73, pulse 73, temperature 97.8 F (36.6 C), temperature source Oral, resp. rate 18, weight 161 lb (73 kg), last menstrual period 06/09/1998, SpO2 100 %.  LABORATORY DATA: Lab Results  Component Value Date   WBC 7.9 02/24/2017   HGB 11.1 (L) 02/24/2017   HCT 32.7 (L) 02/24/2017   MCV 98.4 02/24/2017   PLT 202 02/24/2017      Chemistry      Component Value Date/Time   NA 137 02/03/2017 1052   K 4.3 02/03/2017 1052   CL 107 09/25/2015 0805   CO2 29 02/03/2017 1052   BUN 21.4 02/03/2017 1052   CREATININE 1.2 (H) 02/03/2017 1052   GLU 170  01/23/2016      Component Value Date/Time   CALCIUM 10.1 02/03/2017 1052   ALKPHOS 65 02/03/2017 1052   AST 33 02/03/2017 1052   ALT 22 02/03/2017 1052   BILITOT 0.81 02/03/2017 1052       RADIOGRAPHIC STUDIES: Ct Chest W Contrast  Result Date: 02/02/2017 CLINICAL DATA:  Stage IV right lower lobe lung adenocarcinoma diagnosed March 2017 status post induction systemic chemotherapy with ongoing maintenance chemotherapy. Restaging. EXAM: CT CHEST, ABDOMEN, AND PELVIS WITH CONTRAST TECHNIQUE: Multidetector CT imaging of the chest, abdomen and pelvis was performed following the standard protocol during bolus administration of intravenous contrast. CONTRAST:  123m ISOVUE-300 IOPAMIDOL (ISOVUE-300) INJECTION 61% COMPARISON:  12/01/2016 CT chest, abdomen and pelvis. FINDINGS: CT CHEST FINDINGS Cardiovascular: Normal heart size. No significant pericardial fluid/thickening. Right internal jugular MediPort terminates at the cavoatrial junction. Mildly atherosclerotic nonaneurysmal thoracic aorta. Normal caliber pulmonary arteries. No central pulmonary emboli. Mediastinum/Nodes: No discrete thyroid nodules. Unremarkable esophagus. No pathologically enlarged axillary, mediastinal or hilar lymph nodes. Lungs/Pleura: No pneumothorax. No pleural effusion. Anterior right lower  lobe 2.3 x 1.3 cm pulmonary nodule (series 6/image 78), previously 2.3 x 1.4 cm using similar measurement technique, stable. Stable parenchymal band in the anterior right lower lobe peripheral to the dominant pulmonary nodule. Several (at least 10) scattered small pulmonary nodules throughout both lungs measuring up to 5 mm in the right upper lobe (series 6/ image 39) are all stable. No new significant pulmonary nodules. Mild patchy subpleural reticulation and ground-glass attenuation in the left lower lobe with associated small parenchymal bands, not appreciably changed. Musculoskeletal: Innumerable small sclerotic lesions scattered throughout  the thoracic spine, sternum, bilateral ribs and bilateral shoulder girdles have not appreciably changed in size or number. No appreciable new focal osseous lesions. Mild thoracic spondylosis. CT ABDOMEN PELVIS FINDINGS Hepatobiliary: Normal liver size. Two scattered left liver lobe subcentimeter hypodense lesions are too small to characterize and stable since at least 09/28/2016. No new liver lesions. Cholelithiasis. No biliary ductal dilatation. Pancreas: Normal, with no mass or duct dilation. Spleen: Normal size. No mass. Adrenals/Urinary Tract: Normal adrenals. 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 Orthotopic right kidney. Stable mild pelvocaliectasis in the left renal collecting system. No right hydronephrosis. No renal masses. Normal bladder. Stomach/Bowel: Grossly normal stomach. Stable findings of congenital bowel malrotation with the small bowel loops entirely in the right abdomen and with the colon predominantly in the mid and left abdomen. Normal caliber small bowel loops with no small bowel wall thickening. Normal appendix. No large bowel wall thickening or pericolonic fat stranding. Vascular/Lymphatic: Nonaneurysmal abdominal aorta. Patent portal, splenic, hepatic and renal veins. Stable top-normal size 0.9 cm left external iliac node (series 2/ image 103). No pathologically enlarged lymph nodes in the abdomen or pelvis. Reproductive: No adnexal masses. Stable elongated thin midline uterus. Other: No pneumoperitoneum, ascites or focal fluid collection. Musculoskeletal: Innumerable small sclerotic osseous lesions scattered throughout the lumbar spine, sacrum and bilateral pelvic girdle, not appreciably changed in size or number. No appreciable new focal osseous lesions. Moderate lumbar spondylosis. IMPRESSION: 1. No new or progressive metastatic disease in the chest, abdomen or pelvis. 2. Innumerable small sclerotic osseous metastases throughout the axial skeleton appear stable. 3. Primary  right lower lobe lung neoplasm is stable. Several scattered subcentimeter pulmonary nodules are stable . 4. No pathologically enlarged lymph nodes. 5. Several chronic findings as above. Electronically Signed   By: Ilona Sorrel M.D.   On: 02/02/2017 09:17   Ct Abdomen Pelvis W Contrast  Result Date: 02/02/2017 CLINICAL DATA:  Stage IV right lower lobe lung adenocarcinoma diagnosed March 2017 status post induction systemic chemotherapy with ongoing maintenance chemotherapy. Restaging. EXAM: CT CHEST, ABDOMEN, AND PELVIS WITH CONTRAST TECHNIQUE: Multidetector CT imaging of the chest, abdomen and pelvis was performed following the standard protocol during bolus administration of intravenous contrast. CONTRAST:  121m ISOVUE-300 IOPAMIDOL (ISOVUE-300) INJECTION 61% COMPARISON:  12/01/2016 CT chest, abdomen and pelvis. FINDINGS: CT CHEST FINDINGS Cardiovascular: Normal heart size. No significant pericardial fluid/thickening. Right internal jugular MediPort terminates at the cavoatrial junction. Mildly atherosclerotic nonaneurysmal thoracic aorta. Normal caliber pulmonary arteries. No central pulmonary emboli. Mediastinum/Nodes: No discrete thyroid nodules. Unremarkable esophagus. No pathologically enlarged axillary, mediastinal or hilar lymph nodes. Lungs/Pleura: No pneumothorax. No pleural effusion. Anterior right lower lobe 2.3 x 1.3 cm pulmonary nodule (series 6/image 78), previously 2.3 x 1.4 cm using similar measurement technique, stable. Stable parenchymal band in the anterior right lower lobe peripheral to the dominant pulmonary nodule. Several (at least 10) scattered small pulmonary nodules throughout both lungs measuring up to 5 mm in  the right upper lobe (series 6/ image 39) are all stable. No new significant pulmonary nodules. Mild patchy subpleural reticulation and ground-glass attenuation in the left lower lobe with associated small parenchymal bands, not appreciably changed. Musculoskeletal: Innumerable  small sclerotic lesions scattered throughout the thoracic spine, sternum, bilateral ribs and bilateral shoulder girdles have not appreciably changed in size or number. No appreciable new focal osseous lesions. Mild thoracic spondylosis. CT ABDOMEN PELVIS FINDINGS Hepatobiliary: Normal liver size. Two scattered left liver lobe subcentimeter hypodense lesions are too small to characterize and stable since at least 09/28/2016. No new liver lesions. Cholelithiasis. No biliary ductal dilatation. Pancreas: Normal, with no mass or duct dilation. Spleen: Normal size. No mass. Adrenals/Urinary Tract: Normal adrenals. 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 Orthotopic right kidney. Stable mild pelvocaliectasis in the left renal collecting system. No right hydronephrosis. No renal masses. Normal bladder. Stomach/Bowel: Grossly normal stomach. Stable findings of congenital bowel malrotation with the small bowel loops entirely in the right abdomen and with the colon predominantly in the mid and left abdomen. Normal caliber small bowel loops with no small bowel wall thickening. Normal appendix. No large bowel wall thickening or pericolonic fat stranding. Vascular/Lymphatic: Nonaneurysmal abdominal aorta. Patent portal, splenic, hepatic and renal veins. Stable top-normal size 0.9 cm left external iliac node (series 2/ image 103). No pathologically enlarged lymph nodes in the abdomen or pelvis. Reproductive: No adnexal masses. Stable elongated thin midline uterus. Other: No pneumoperitoneum, ascites or focal fluid collection. Musculoskeletal: Innumerable small sclerotic osseous lesions scattered throughout the lumbar spine, sacrum and bilateral pelvic girdle, not appreciably changed in size or number. No appreciable new focal osseous lesions. Moderate lumbar spondylosis. IMPRESSION: 1. No new or progressive metastatic disease in the chest, abdomen or pelvis. 2. Innumerable small sclerotic osseous metastases throughout  the axial skeleton appear stable. 3. Primary right lower lobe lung neoplasm is stable. Several scattered subcentimeter pulmonary nodules are stable . 4. No pathologically enlarged lymph nodes. 5. Several chronic findings as above. Electronically Signed   By: Ilona Sorrel M.D.   On: 02/02/2017 09:17    ASSESSMENT AND PLAN:  This is a very pleasant 71 years old Hispanic female with metastatic non-small cell lung cancer, adenocarcinoma status post induction systemic chemotherapy with carboplatin and Alimta and she is currently on maintenance treatment with single agent Alimta status post 16 cycles. The patient continues to to the treatment fairly well. I recommended for her to proceed with cycle #17 today as scheduled.  For the low back pain secondary to bone metastasis, I will refer the patient to radiation oncology for evaluation and consideration of palliative radiotherapy to this area.  She would come back for follow-up visit in 3 weeks for evaluation before the next cycle of her treatment.  She was advised to call immediately if she has any concerning symptoms in the interval. The patient voices understanding of current disease status and treatment options and is in agreement with the current care plan. All questions were answered. The patient knows to call the clinic with any problems, questions or concerns. We can certainly see the patient much sooner if necessary.  Disclaimer: This note was dictated with voice recognition software. Similar sounding words can inadvertently be transcribed and may not be corrected upon review.

## 2017-02-24 NOTE — Patient Instructions (Signed)
[facility] Discharge Instructions for Patients Receiving Chemotherapy  Today you received the following chemotherapy agents ALIMTA To help prevent nausea and vomiting after your treatment, we encourage you to take your nausea medication   If you develop nausea and vomiting that is not controlled by your nausea medication, call the clinic.   BELOW ARE SYMPTOMS THAT SHOULD BE REPORTED IMMEDIATELY:  *FEVER GREATER THAN 100.5 F  *CHILLS WITH OR WITHOUT FEVER  NAUSEA AND VOMITING THAT IS NOT CONTROLLED WITH YOUR NAUSEA MEDICATION  *UNUSUAL SHORTNESS OF BREATH  *UNUSUAL BRUISING OR BLEEDING  TENDERNESS IN MOUTH AND THROAT WITH OR WITHOUT PRESENCE OF ULCERS  *URINARY PROBLEMS  *BOWEL PROBLEMS  UNUSUAL RASH Items with * indicate a potential emergency and should be followed up as soon as possible.  Feel free to call the clinic you have any questions or concerns. The clinic phone number is [phone].  Please show the [place] at check-in to the Emergency Department and triage nurse.

## 2017-02-25 ENCOUNTER — Encounter: Payer: Self-pay | Admitting: Radiation Oncology

## 2017-03-07 NOTE — Progress Notes (Addendum)
Histology and Location of Primary Cancer: Non small cell lung cancer with bone metastases  Past/Anticipated chemotherapy by medical oncology, if any: Dr. Julien Nordmann 02/24/17 appt: PRIOR THERAPY: Induction systemic chemotherapy with carboplatin for AUC of 5 and Alimta 500 MG/M2 every 3 weeks is status post 6 cycles at the [facility], last dose was given 03/05/2016 with stable disease.  CURRENT THERAPY::  1)  Maintenance systemic chemotherapy with Alimta 500 MG/M2 every 3 weeks status post 16 cycles. First dose was given 03/25/2016.  2) Zometa 4 mg IV every 12 week for metastatic bone disease. Referral eval and consideration for   palliative radiation bone mets lower back referral for possible palliative radiation    Musculoskeletal: Innumerable small sclerotic osseous lesions scattered throughout the lumbar spine, sacrum and bilateral pelvic girdle, not appreciably changed in size or number. No appreciable new focal osseous lesions. Moderate lumbar spondylosis.  IMPRESSION: CT Chest 02/01/17: 1. No new or progressive metastatic disease in the chest, abdomen or pelvis. 2. Innumerable small sclerotic osseous metastases throughout the axial skeleton appear stable. 3. Primary right lower lobe lung neoplasm is stable. Several scattered subcentimeter pulmonary nodules are stable . 4. No pathologically enlarged lymph nodes. 5. Several chronic findings as above.   If Spine Met(s), symptoms, if any, include:  Bowel/Bladder retention or incontinence (please describe):The patient denies any issues with her bowel or bladder.  Numbness or weakness in extremities (please describe):The patient states that she has numbness in her hands when she is sleeping.She also states that she has tingling in both of her legs.  Current Decadron regimen, if applicable: Patient states that she takes one pill twice a day  the day before chemo and one pill the day of chemo and one pill twice a day the day  after chemo.  Ambulatory status? Ambulates  SAFETY ISSUES:  Prior radiation? NO  Pacemaker/ICD? No   Is the patient on methotrexate? No  Current Complaints / other details:    Vitals:   03/15/17 1219  BP: 132/85  Pulse: 80  Resp: 20  Temp: 98.1 F (36.7 C)  TempSrc: Oral  SpO2: 100%  Weight: 160 lb 6 oz (72.7 kg)   Wt Readings from Last 3 Encounters:  03/15/17 160 lb 6 oz (72.7 kg)  02/24/17 161 lb (73 kg)  02/03/17 161 lb 9.6 oz (73.3 kg)

## 2017-03-14 NOTE — Progress Notes (Signed)
Radiation Oncology         [phone] ________________________________  Name: Valerie Long        MRN: [id number]  Date of Service: 03/15/2017 DOB: 1946-05-10  CC:Paz, Alda Berthold, MD  Curt Bears, MD     REFERRING PHYSICIAN: Curt Bears, MD   DIAGNOSIS: The primary encounter diagnosis was Adenocarcinoma of right lung, stage 4 ([place]). A diagnosis of Bone metastases ([place]) was also pertinent to this visit.   HISTORY OF PRESENT ILLNESS: Valerie Long is a 71 y.o. female seen at the request of Dr. Julien Nordmann for a diagnosis of Stage IV [id number] NSCLC, adenocarcinoma of the right lung who presented in 2017 with bony metastasis to the pelvic girdle. She was diagnosed with tissue biopsy after several attempts on 09/25/15. She sought care with Dr. Julien Nordmann as well as a second opinion at [facility] and has received single agent Alimta since. She is currently in the midst of cycle #17. She was found on her most recent CT on 02/01/17 to have disease not only in the pelvic girdle, but also within the lumbar spine. She has had discomfort off and on since she last discussed this with Dr. Julien Nordmann and comes today to discuss options of radiotherapy.    PREVIOUS RADIATION THERAPY: No   PAST MEDICAL HISTORY:  Past Medical History:  Diagnosis Date  . Adenocarcinoma of right lung, stage 4 ([place]) 2017  . Adenocarcinoma of right lung, stage 4 ([place])   . Anemia   . Arthritis   . Bone metastases ([place]) 02/24/2017  . Encounter for antineoplastic chemotherapy 03/17/2016  . Hypercholesterolemia   . Hypertension 06/18/2016  . Osteopenia   . 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 Pneumonia   . Shortness of breath dyspnea        PAST SURGICAL HISTORY: Past Surgical History:  Procedure Laterality Date  . CESAREAN SECTION     myomectomy  . COLONOSCOPY W/ POLYPECTOMY    . IR GENERIC HISTORICAL  08/17/2016   IR US GUIDE VASC ACCESS RIGHT 08/17/2016 [name], MD [facility]  . IR  GENERIC HISTORICAL  08/17/2016   IR FLUORO GUIDE PORT INSERTION RIGHT 08/17/2016 [name], MD [facility]  . MEDIASTINOSCOPY N/A 09/25/2015   Procedure: MEDIASTINOSCOPY;  Surgeon: Melrose Nakayama, MD;  Location: [facility];  Service: Thoracic;  Laterality: N/A;  . VIDEO BRONCHOSCOPY Bilateral 08/19/2015   Procedure: VIDEO BRONCHOSCOPY WITH FLUORO;  Surgeon: Rigoberto Noel, MD;  Location: [facility];  Service: Cardiopulmonary;  Laterality: Bilateral;  . VIDEO BRONCHOSCOPY N/A 09/08/2015   Procedure: VIDEO BRONCHOSCOPY WITH FLUORO;  Surgeon: Rigoberto Noel, MD;  Location: [facility] OR;  Service: Thoracic;  Laterality: N/A;  . VIDEO BRONCHOSCOPY WITH ENDOBRONCHIAL ULTRASOUND Right 09/08/2015   Procedure: ATTEMPTED VIDEO BRONCHOSCOPY ENDOBRONCHIAL ULTRASOUND  ;  Surgeon: Rigoberto Noel, MD;  Location: [facility];  Service: Thoracic;  Laterality: Right;     FAMILY HISTORY:  Family History  Problem Relation Age of Onset  . Hypertension Mother        F and M  . CAD Father   . Diabetes Other        auncle-aunts   . Cancer Other        UTERINE   . Breast cancer Sister   . Stroke Neg Hx   . Colon cancer Neg Hx      SOCIAL HISTORY:  reports that she has never smoked. She has never used smokeless tobacco. She  reports that she drinks alcohol. She reports that she does not use drugs. The patient is separated. 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 She is from the [place] and lives in [place] near family.   ALLERGIES: Patient has no known allergies.   MEDICATIONS:  Current Outpatient Prescriptions  Medication Sig Dispense Refill  . acetaminophen (TYLENOL) 500 MG tablet Take 1,000 mg by mouth every 6 (six) hours as needed for moderate pain or headache.    . Calcium Carb-Cholecalciferol (CALCIUM 600/VITAMIN D3 PO) Take by mouth.    . Cholecalciferol (VITAMIN D3 PO) Take 1 tablet by mouth 2 (two) times daily.     . Cyanocobalamin (B-12 PO) Take 2 tablets by mouth  daily.    Marland Kitchen dexamethasone (DECADRON) 4 MG tablet Take 1 tablet (4 mg total) by mouth 2 (two) times daily. Take 4mg  by mouth twice daily the day before, of, and after chemo 40 tablet 0  . Fish Oil-Cholecalciferol (OMEGA-3 + VITAMIN D3 PO) Take 1 tablet by mouth daily.    . folic acid (FOLVITE) 1 MG tablet TAKE 1 TABLET BY MOUTH EVERY DAY 30 tablet 0  . lidocaine-prilocaine (EMLA) cream Apply 1 application topically as needed. Apply 1-2 tsp over port site 1-2 hours prior to chemo. 30 g 0  . ondansetron (ZOFRAN) 8 MG tablet Take 8 mg by mouth every 8 (eight) hours as needed.    Marland Kitchen PRESCRIPTION MEDICATION Inject 1 Dose as directed every 21 ( twenty-one) days. Chemo     No current facility-administered medications for this encounter.      REVIEW OF SYSTEMS: On review of systems, the patient reports that she is doing well overall. She tires easily since chemotherapy. She denies any chest pain, shortness of breath, cough, fevers, chills, night sweats, unintended weight changes. She reports soreness and fatigue in her back and describes shooting discomfort down the left thigh. She denies any bowel or bladder disturbances, and denies abdominal pain, nausea or vomiting. She reports that she finds relief with tylenol and does not take any other pain medication. She denies any new musculoskeletal or joint aches or pains. A complete review of systems is obtained and is otherwise negative.     PHYSICAL EXAM:  Wt Readings from Last 3 Encounters:  03/15/17 160 lb 6 oz (72.7 kg)  02/24/17 161 lb (73 kg)  02/03/17 161 lb 9.6 oz (73.3 kg)   Temp Readings from Last 3 Encounters:  03/15/17 98.1 F (36.7 C) (Oral)  02/24/17 97.8 F (36.6 C) (Oral)  02/03/17 98 F (36.7 C) (Oral)   BP Readings from Last 3 Encounters:  03/15/17 132/85  02/24/17 128/73  02/03/17 (!) 142/83   Pulse Readings from Last 3 Encounters:  03/15/17 80  02/24/17 73  02/03/17 71   Pain Assessment Pain Score: 4  Pain Loc:  Back/10  In general this is a well appearing [place] female in no acute distress. She is alert and oriented x4 and appropriate throughout the examination. HEENT reveals that the patient is normocephalic, atraumatic. EOMs are intact. PERRLA. Skin is intact without any evidence of gross lesions. Cardiovascular exam reveals a regular rate and rhythm, no clicks rubs or murmurs are auscultated. Chest is clear to auscultation bilaterally.   ECOG = 1  0 - Asymptomatic (Fully active, able to carry on all predisease activities without restriction)  1 - Symptomatic but completely ambulatory (Restricted in physically strenuous activity but ambulatory and able to carry out work of a  light or sedentary nature. For example, light housework, office work)  2 - Symptomatic, <50% in bed during the day (Ambulatory and capable of all self care but unable to carry out any work activities. Up and about more than 50% of waking hours)  3 - Symptomatic, >50% in bed, but not bedbound (Capable of only limited self-care, confined to bed or chair 50% or more of waking hours)  4 - Bedbound (Completely disabled. Cannot carry on any self-care. Totally confined to bed or chair)  5 - Death   Eustace Pen MM, Creech RH, Tormey DC, et al. [phone]). "Toxicity and response criteria of the [facility]". [place] Oncol. 5 (6): 649-55    LABORATORY DATA:  Lab Results  Component Value Date   WBC 7.9 02/24/2017   HGB 11.1 (L) 02/24/2017   HCT 32.7 (L) 02/24/2017   MCV 98.4 02/24/2017   PLT 202 02/24/2017   Lab Results  Component Value Date   NA 140 02/24/2017   K 4.1 02/24/2017   CL 107 09/25/2015   CO2 27 02/24/2017   Lab Results  Component Value Date   ALT 22 02/24/2017   AST 31 02/24/2017   ALKPHOS 62 02/24/2017   BILITOT 0.75 02/24/2017      RADIOGRAPHY: No results found.     IMPRESSION/PLAN: 1. Stage IV, T1bN2M1b, NSCLC, adenocarcinoma of the right lung with metastatic disease to the  lumbar spine and pelvic girdle. Dr. Lisbeth Renshaw reviewed the options for treatment while she undergoes chemotherapy treatment. She is interested in holding off on treatment for now, and would like to get her next CT scan prior to making a final decision for radiation treatment.  We discussed the risks, benefits, short, and long term effects of radiotherapy. Dr. Lisbeth Renshaw discusses the delivery and logistics of radiotherapy and anticipates a course of 3 weeks. She will contact us if she has progressive pain, and or neurologic symptoms or if she decides to move forward prior to additional imaging. She will also continue her Alimta and Zometa per Dr. Julien Nordmann.  In a visit lasting 45 minutes, greater than 50% of the time was spent face to face discussing her waxing and waning symptoms, and coordinating the patient's care.   The above documentation reflects my direct findings during this shared patient visit. Please see the separate note by Dr. Lisbeth Renshaw on this date for the remainder of the patient's plan of care.    Carola Rhine, PAC

## 2017-03-15 ENCOUNTER — Other Ambulatory Visit: Payer: Self-pay | Admitting: Internal Medicine

## 2017-03-15 ENCOUNTER — Ambulatory Visit
Admission: RE | Admit: 2017-03-15 | Discharge: 2017-03-15 | Disposition: A | Discharge status: Home / Self Care | Payer: Medicare Other | Source: Ambulatory Visit | Attending: Radiation Oncology | Admitting: Radiation Oncology

## 2017-03-15 ENCOUNTER — Encounter: Payer: Self-pay | Admitting: Radiation Oncology

## 2017-03-15 ENCOUNTER — Telehealth: Payer: Self-pay

## 2017-03-15 ENCOUNTER — Ambulatory Visit: Admission: RE | Admit: 2017-03-15 | Payer: Medicare Other | Source: Ambulatory Visit | Admitting: Radiation Oncology

## 2017-03-15 VITALS — BP 132/85 | HR 80 | Temp 98.1°F | Resp 20 | Wt 160.4 lb

## 2017-03-15 DIAGNOSIS — I10 Essential (primary) hypertension: Secondary | ICD-10-CM | POA: Diagnosis not present

## 2017-03-15 DIAGNOSIS — Z8249 Family history of ischemic heart disease and other diseases of the circulatory system: Secondary | ICD-10-CM | POA: Insufficient documentation

## 2017-03-15 DIAGNOSIS — Z510 Encounter for antineoplastic radiation therapy: Secondary | ICD-10-CM | POA: Insufficient documentation

## 2017-03-15 DIAGNOSIS — C3490 Malignant neoplasm of unspecified part of unspecified bronchus or lung: Secondary | ICD-10-CM | POA: Diagnosis present

## 2017-03-15 DIAGNOSIS — C3491 Malignant neoplasm of unspecified part of right bronchus or lung: Secondary | ICD-10-CM | POA: Insufficient documentation

## 2017-03-15 DIAGNOSIS — C7801 Secondary malignant neoplasm of right lung: Secondary | ICD-10-CM | POA: Diagnosis not present

## 2017-03-15 DIAGNOSIS — C7951 Secondary malignant neoplasm of bone: Secondary | ICD-10-CM | POA: Diagnosis not present

## 2017-03-15 DIAGNOSIS — E7800 Pure hypercholesterolemia, unspecified: Secondary | ICD-10-CM | POA: Diagnosis not present

## 2017-03-15 DIAGNOSIS — M8580 Other specified disorders of bone density and structure, unspecified site: Secondary | ICD-10-CM | POA: Insufficient documentation

## 2017-03-15 NOTE — Telephone Encounter (Signed)
Pt called asking if it was OK to get her eyes dilated at optometrist visit on Wed, and receive alimta on Thursday, asking about interaction. S/w Arbie Cookey pharmD and told pt is was OK to do this.

## 2017-03-16 DIAGNOSIS — H04123 Dry eye syndrome of bilateral lacrimal glands: Secondary | ICD-10-CM | POA: Diagnosis not present

## 2017-03-16 DIAGNOSIS — H25813 Combined forms of age-related cataract, bilateral: Secondary | ICD-10-CM | POA: Diagnosis not present

## 2017-03-17 ENCOUNTER — Telehealth: Payer: Self-pay

## 2017-03-17 ENCOUNTER — Other Ambulatory Visit (HOSPITAL_BASED_OUTPATIENT_CLINIC_OR_DEPARTMENT_OTHER): Payer: Medicare Other

## 2017-03-17 ENCOUNTER — Ambulatory Visit (HOSPITAL_BASED_OUTPATIENT_CLINIC_OR_DEPARTMENT_OTHER): Payer: Medicare Other | Admitting: Internal Medicine

## 2017-03-17 ENCOUNTER — Ambulatory Visit (HOSPITAL_BASED_OUTPATIENT_CLINIC_OR_DEPARTMENT_OTHER): Payer: Medicare Other

## 2017-03-17 ENCOUNTER — Encounter: Payer: Self-pay | Admitting: Internal Medicine

## 2017-03-17 VITALS — BP 143/81 | HR 90 | Temp 97.6°F | Resp 18 | Ht 69.0 in | Wt 161.3 lb

## 2017-03-17 DIAGNOSIS — C3491 Malignant neoplasm of unspecified part of right bronchus or lung: Secondary | ICD-10-CM

## 2017-03-17 DIAGNOSIS — Z5111 Encounter for antineoplastic chemotherapy: Secondary | ICD-10-CM

## 2017-03-17 DIAGNOSIS — T451X5A Adverse effect of antineoplastic and immunosuppressive drugs, initial encounter: Secondary | ICD-10-CM

## 2017-03-17 DIAGNOSIS — C3490 Malignant neoplasm of unspecified part of unspecified bronchus or lung: Secondary | ICD-10-CM

## 2017-03-17 DIAGNOSIS — D6481 Anemia due to antineoplastic chemotherapy: Secondary | ICD-10-CM

## 2017-03-17 DIAGNOSIS — G893 Neoplasm related pain (acute) (chronic): Secondary | ICD-10-CM | POA: Diagnosis not present

## 2017-03-17 DIAGNOSIS — C3431 Malignant neoplasm of lower lobe, right bronchus or lung: Secondary | ICD-10-CM

## 2017-03-17 DIAGNOSIS — C7951 Secondary malignant neoplasm of bone: Secondary | ICD-10-CM

## 2017-03-17 LAB — CBC WITH DIFFERENTIAL/PLATELET
BASO%: 0.1 % (ref 0.0–2.0)
Basophils Absolute: 0 10*3/uL (ref 0.0–0.1)
EOS%: 0 % (ref 0.0–7.0)
Eosinophils Absolute: 0 10*3/uL (ref 0.0–0.5)
HCT: 33.7 % — ABNORMAL LOW (ref 34.8–46.6)
HGB: 11.4 g/dL — ABNORMAL LOW (ref 11.6–15.9)
LYMPH%: 11.1 % — ABNORMAL LOW (ref 14.0–49.7)
MCH: 33.7 pg (ref 25.1–34.0)
MCHC: 33.9 g/dL (ref 31.5–36.0)
MCV: 99.4 fL (ref 79.5–101.0)
MONO#: 0.3 10*3/uL (ref 0.1–0.9)
MONO%: 6.5 % (ref 0.0–14.0)
NEUT#: 4.2 10*3/uL (ref 1.5–6.5)
NEUT%: 82.3 % — ABNORMAL HIGH (ref 38.4–76.8)
Platelets: 246 10*3/uL (ref 145–400)
RBC: 3.39 10*6/uL — ABNORMAL LOW (ref 3.70–5.45)
RDW: 14.5 % (ref 11.2–14.5)
WBC: 5.1 10*3/uL (ref 3.9–10.3)
lymph#: 0.6 10*3/uL — ABNORMAL LOW (ref 0.9–3.3)

## 2017-03-17 LAB — COMPREHENSIVE METABOLIC PANEL
ALT: 23 U/L (ref 0–55)
AST: 29 U/L (ref 5–34)
Albumin: 3.7 g/dL (ref 3.5–5.0)
Alkaline Phosphatase: 66 U/L (ref 40–150)
Anion Gap: 10 mEq/L (ref 3–11)
BUN: 21.9 mg/dL (ref 7.0–26.0)
CO2: 25 mEq/L (ref 22–29)
Calcium: 9.8 mg/dL (ref 8.4–10.4)
Chloride: 103 mEq/L (ref 98–109)
Creatinine: 1.2 mg/dL — ABNORMAL HIGH (ref 0.6–1.1)
EGFR: 45 mL/min/{1.73_m2} — ABNORMAL LOW (ref >60)
Glucose: 134 mg/dl (ref 70–140)
Potassium: 4.3 mEq/L (ref 3.5–5.1)
Sodium: 138 mEq/L (ref 136–145)
Total Bilirubin: 0.68 mg/dL (ref 0.20–1.20)
Total Protein: 7.2 g/dL (ref 6.4–8.3)

## 2017-03-17 MED ORDER — SODIUM CHLORIDE 0.9 % IV SOLN
Freq: Once | INTRAVENOUS | Status: AC
  Administered 2017-03-17: 12:00:00 via INTRAVENOUS

## 2017-03-17 MED ORDER — PROCHLORPERAZINE MALEATE 10 MG PO TABS
10.0000 mg | ORAL_TABLET | Freq: Once | ORAL | Status: AC
  Administered 2017-03-17: 10 mg via ORAL

## 2017-03-17 MED ORDER — HEPARIN SOD (PORK) LOCK FLUSH 100 UNIT/ML IV SOLN
500.0000 [IU] | Freq: Once | INTRAVENOUS | Status: AC | PRN
  Administered 2017-03-17: 500 [IU]
  Filled 2017-03-17: qty 5

## 2017-03-17 MED ORDER — SODIUM CHLORIDE 0.9 % IV SOLN
530.0000 mg/m2 | Freq: Once | INTRAVENOUS | Status: AC
  Administered 2017-03-17: 1000 mg via INTRAVENOUS
  Filled 2017-03-17: qty 40

## 2017-03-17 MED ORDER — SODIUM CHLORIDE 0.9% FLUSH
10.0000 mL | INTRAVENOUS | Status: DC | PRN
  Administered 2017-03-17: 10 mL
  Filled 2017-03-17: qty 10

## 2017-03-17 MED ORDER — PROCHLORPERAZINE MALEATE 10 MG PO TABS
ORAL_TABLET | ORAL | Status: AC
Start: 2017-03-17 — End: 2017-03-17
  Filled 2017-03-17: qty 1

## 2017-03-17 NOTE — Patient Instructions (Signed)
[facility] Discharge Instructions for Patients Receiving Chemotherapy  Today you received the following chemotherapy agents: ALIMTA To help prevent nausea and vomiting after your treatment, we encourage you to take your nausea medication   If you develop nausea and vomiting that is not controlled by your nausea medication, call the clinic.   BELOW ARE SYMPTOMS THAT SHOULD BE REPORTED IMMEDIATELY:  *FEVER GREATER THAN 100.5 F  *CHILLS WITH OR WITHOUT FEVER  NAUSEA AND VOMITING THAT IS NOT CONTROLLED WITH YOUR NAUSEA MEDICATION  *UNUSUAL SHORTNESS OF BREATH  *UNUSUAL BRUISING OR BLEEDING  TENDERNESS IN MOUTH AND THROAT WITH OR WITHOUT PRESENCE OF ULCERS  *URINARY PROBLEMS  *BOWEL PROBLEMS  UNUSUAL RASH Items with * indicate a potential emergency and should be followed up as soon as possible.  Feel free to call the clinic you have any questions or concerns. The clinic phone number is [phone].  Please show the [facility] at check-in to the Emergency Department and triage nurse.

## 2017-03-17 NOTE — Progress Notes (Signed)
[facility] [phone]   Fax:(336) [place], MD [address] [place] [address]  DIAGNOSIS: Stage IV (T1b, N2, M1b) non-small cell lung cancer, adenocarcinoma diagnosed in March 2017 and presented with right lower lobe lung nodule in addition to mediastinal lymphadenopathy and metastatic bone lesions.  Molecular studies: PDL 1 TPS  <1%.   Foundation One Studies: Positive for ERBB2 [id number]. Negative for EGFR, KRAS, ALK, BRAF, MET, RET and ROS1.  PRIOR THERAPY: Induction systemic chemotherapy with carboplatin for AUC of 5 and Alimta 500 MG/M2 every 3 weeks is status post 6 cycles at the [facility], last dose was given 03/05/2016 with stable disease.  CURRENT THERAPY::  1)  Maintenance systemic chemotherapy with Alimta 500 MG/M2 every 3 weeks status post 17 cycles. First dose was given 03/25/2016.  2) Zometa 4 mg IV every 12 week for metastatic bone disease.  INTERVAL HIST Valerie Long 71 y.o. female returns to the clinic today for follow-up visit accompanied by her sister. The patient is feeling fine today with no specific complaints. Her back pain is better. She was seen by radiation oncology recently and she decided to hold on radiotherapy for now until the next scan. She denied having any chest pain, shortness of breath, cough or hemoptysis. She denied having any fever or chills. She has no nausea, vomiting, diarrhea or constipation. She is here today for evaluation before starting cycle #18.   MEDICAL HISTORY: Past Medical History:  Diagnosis Date  . Adenocarcinoma of right lung, stage 4 ([place]) 2017  . Adenocarcinoma of right lung, stage 4 ([place])   . Anemia   . Arthritis   . Bone metastases ([place]) 02/24/2017  . Encounter for antineoplastic chemotherapy 03/17/2016  . Hypercholesterolemia   . Hypertension 06/18/2016  . Osteopenia   . Pelvic kidney    Left. On CT in [place]  . Pneumonia   . Shortness of breath dyspnea     ALLERGIES:  has No Known Allergies.  MEDICATIONS:  Current Outpatient Prescriptions  Medication Sig Dispense Refill  . acetaminophen (TYLENOL) 500 MG tablet Take 1,000 mg by mouth every 6 (six) hours as needed for moderate pain or headache.    . Calcium Carb-Cholecalciferol (CALCIUM 600/VITAMIN D3 PO) Take by mouth.    . Cholecalciferol (VITAMIN D3 PO) Take 1 tablet by mouth 2 (two) times daily.     . Cyanocobalamin (B-12 PO) Take 2 tablets by mouth daily.    Marland Kitchen dexamethasone (DECADRON) 4 MG tablet Take 1 tablet (4 mg total) by mouth 2 (two) times daily. Take 72m by mouth twice daily the day before, of, and after chemo 40 tablet 0  . Fish Oil-Cholecalciferol (OMEGA-3 + VITAMIN D3 PO) Take 1 tablet by mouth daily.    . folic acid (FOLVITE) 1 MG tablet TAKE 1 TABLET BY MOUTH EVERY DAY 30 tablet 0  . lidocaine-prilocaine (EMLA) cream Apply 1 application topically as needed. Apply 1-2 tsp over port site 1-2 hours prior to chemo. 30 g 0  . ondansetron (ZOFRAN) 8 MG tablet Take 8 mg by mouth every 8 (eight) hours as needed.    .Marland KitchenPRESCRIPTION MEDICATION Inject 1 Dose as directed every 21 ( twenty-one) days. Chemo     No current facility-administered medications for this visit.     SURGICAL HISTORY:  Past Surgical History:  Procedure Laterality Date  . CESAREAN SECTION  myomectomy  . COLONOSCOPY W/ POLYPECTOMY    . IR GENERIC HISTORICAL  08/17/2016   IR US GUIDE VASC ACCESS RIGHT 08/17/2016 [name], MD [facility]  . IR GENERIC HISTORICAL  08/17/2016   IR FLUORO GUIDE PORT INSERTION RIGHT 08/17/2016 [name], MD [facility]  . MEDIASTINOSCOPY N/A 09/25/2015   Procedure: MEDIASTINOSCOPY;  Surgeon: Melrose Nakayama, MD;  Location: [facility];  Service: Thoracic;  Laterality: N/A;  . VIDEO BRONCHOSCOPY Bilateral 08/19/2015   Procedure: VIDEO BRONCHOSCOPY WITH FLUORO;  Surgeon: Rigoberto Noel, MD;  Location: [facility];  Service: Cardiopulmonary;  Laterality: Bilateral;  . VIDEO BRONCHOSCOPY N/A 09/08/2015   Procedure: VIDEO BRONCHOSCOPY WITH FLUORO;  Surgeon: Rigoberto Noel, MD;  Location: [facility];  Service: Thoracic;  Laterality: N/A;  . VIDEO BRONCHOSCOPY WITH ENDOBRONCHIAL ULTRASOUND Right 09/08/2015   Procedure: ATTEMPTED VIDEO BRONCHOSCOPY ENDOBRONCHIAL ULTRASOUND  ;  Surgeon: Rigoberto Noel, MD;  Location: [facility];  Service: Thoracic;  Laterality: Right;    REVIEW OF SYSTEMS:  A comprehensive review of systems was negative except for: Musculoskeletal: positive for back pain   PHYSICAL EXAMINATION: General appearance: alert, cooperative and no distress Head: Normocephalic, without obvious abnormality, atraumatic Neck: no adenopathy, no JVD, supple, symmetrical, trachea midline and thyroid not enlarged, symmetric, no tenderness/mass/nodules Lymph nodes: Cervical, supraclavicular, and axillary nodes normal. Resp: clear to auscultation bilaterally Back: symmetric, no curvature. ROM normal. No CVA tenderness. Cardio: regular rate and rhythm, S1, S2 normal, no murmur, click, rub or gallop GI: soft, non-tender; bowel sounds normal; no masses,  no organomegaly Extremities: extremities normal, atraumatic, no cyanosis or edema  ECOG PERFORMANCE STATUS: 0 - Asymptomatic  Blood pressure (!) 143/81, pulse 90, temperature 97.6 F (36.4 C), temperature source Oral, resp. rate 18, height _0  (1.753 m), weight 161 lb 4.8 oz (73.2 kg), last menstrual period 06/09/1998, SpO2 100 %.  LABORATORY DATA: Lab Results  Component Value Date   WBC 5.1 03/17/2017   HGB 11.4 (L) 03/17/2017   HCT 33.7 (L) 03/17/2017   MCV 99.4 03/17/2017   PLT 246 03/17/2017      Chemistry      Component Value Date/Time   NA 140 02/24/2017 1110   K 4.1 02/24/2017 1110   CL 107 09/25/2015 0805   CO2 27 02/24/2017 1110   BUN 19.5 02/24/2017 1110   CREATININE 1.1 02/24/2017 1110   GLU 170 01/23/2016      Component Value  Date/Time   CALCIUM 9.9 02/24/2017 1110   ALKPHOS 62 02/24/2017 1110   AST 31 02/24/2017 1110   ALT 22 02/24/2017 1110   BILITOT 0.75 02/24/2017 1110       RADIOGRAPHIC STUDIES: No results found.  ASSESSMENT AND PLAN:  This is a very pleasant 72 years old Hispanic female with metastatic non-small cell lung cancer, adenocarcinoma status post induction systemic chemotherapy with carboplatin and Alimta and she is currently on maintenance treatment with single agent Alimta status post 17 cycles. The patient tolerated the last cycle of her treatment well. I recommended for her to proceed with cycle #18 today as scheduled. I will see her back for follow-up visit in 3 weeks for evaluation and repeat CT scan of the chest, abdomen and pelvis for restaging of her disease. The patient was advised to call immediately if she has any concerning symptoms in the interval. The patient voices understanding of current disease status and treatment options and is in agreement with the current care plan. All questions were answered. The  patient knows to call the clinic with any problems, questions or concerns. We can certainly see the patient much sooner if necessary.  Disclaimer: This note was dictated with voice recognition software. Similar sounding words can inadvertently be transcribed and may not be corrected upon review.       

## 2017-03-17 NOTE — Telephone Encounter (Signed)
Printed avs and calender for upcoming appointment. Per 10/11 los 

## 2017-03-29 ENCOUNTER — Ambulatory Visit (HOSPITAL_COMMUNITY)
Admission: RE | Admit: 2017-03-29 | Discharge: 2017-03-29 | Disposition: A | Discharge status: Home / Self Care | Payer: Medicare Other | Source: Ambulatory Visit | Attending: Internal Medicine | Admitting: Internal Medicine

## 2017-03-29 DIAGNOSIS — K8020 Calculus of gallbladder without cholecystitis without obstruction: Secondary | ICD-10-CM | POA: Insufficient documentation

## 2017-03-29 DIAGNOSIS — I2510 Atherosclerotic heart disease of native coronary artery without angina pectoris: Secondary | ICD-10-CM | POA: Diagnosis not present

## 2017-03-29 DIAGNOSIS — T451X5A Adverse effect of antineoplastic and immunosuppressive drugs, initial encounter: Secondary | ICD-10-CM

## 2017-03-29 DIAGNOSIS — C7951 Secondary malignant neoplasm of bone: Secondary | ICD-10-CM | POA: Insufficient documentation

## 2017-03-29 DIAGNOSIS — Z5111 Encounter for antineoplastic chemotherapy: Secondary | ICD-10-CM

## 2017-03-29 DIAGNOSIS — Q433 Congenital malformations of intestinal fixation: Secondary | ICD-10-CM | POA: Diagnosis not present

## 2017-03-29 DIAGNOSIS — D6481 Anemia due to antineoplastic chemotherapy: Secondary | ICD-10-CM | POA: Diagnosis not present

## 2017-03-29 DIAGNOSIS — I700 Atherosclerosis of aorta: Secondary | ICD-10-CM | POA: Diagnosis not present

## 2017-03-29 DIAGNOSIS — C3490 Malignant neoplasm of unspecified part of unspecified bronchus or lung: Secondary | ICD-10-CM | POA: Diagnosis not present

## 2017-03-29 DIAGNOSIS — C3491 Malignant neoplasm of unspecified part of right bronchus or lung: Secondary | ICD-10-CM | POA: Diagnosis not present

## 2017-03-29 MED ORDER — IOPAMIDOL (ISOVUE-300) INJECTION 61%
INTRAVENOUS | Status: AC
  Filled 2017-03-29: qty 100

## 2017-03-29 MED ORDER — IOPAMIDOL (ISOVUE-300) INJECTION 61%
100.0000 mL | Freq: Once | INTRAVENOUS | Status: AC | PRN
  Administered 2017-03-29: 80 mL via INTRAVENOUS

## 2017-03-31 ENCOUNTER — Telehealth: Payer: Self-pay | Admitting: *Deleted

## 2017-03-31 NOTE — Telephone Encounter (Signed)
"  Calling to speak with Dr. Worthy Flank nurse.  Cannot leave a message on voicemail because I have to work with radiation.  I had a CT scan March 29, 2017 and need these results."  Next scheduled F/U on 04-07-2017, Dr. Julien Nordmann will provide results with this visit.   "Thanks for the information.  I do not ned anything else.  Will you transfer me to radiation to schedule?"

## 2017-04-04 ENCOUNTER — Ambulatory Visit (HOSPITAL_COMMUNITY): Payer: Medicare Other

## 2017-04-06 ENCOUNTER — Ambulatory Visit
Admission: RE | Admit: 2017-04-06 | Discharge: 2017-04-06 | Disposition: A | Discharge status: Home / Self Care | Payer: Medicare Other | Source: Ambulatory Visit | Attending: Radiation Oncology | Admitting: Radiation Oncology

## 2017-04-06 DIAGNOSIS — Z510 Encounter for antineoplastic radiation therapy: Secondary | ICD-10-CM | POA: Diagnosis not present

## 2017-04-06 DIAGNOSIS — C7801 Secondary malignant neoplasm of right lung: Secondary | ICD-10-CM | POA: Diagnosis not present

## 2017-04-06 DIAGNOSIS — C7951 Secondary malignant neoplasm of bone: Secondary | ICD-10-CM | POA: Diagnosis not present

## 2017-04-06 DIAGNOSIS — C3491 Malignant neoplasm of unspecified part of right bronchus or lung: Secondary | ICD-10-CM | POA: Diagnosis not present

## 2017-04-06 DIAGNOSIS — E7800 Pure hypercholesterolemia, unspecified: Secondary | ICD-10-CM | POA: Diagnosis not present

## 2017-04-06 DIAGNOSIS — I10 Essential (primary) hypertension: Secondary | ICD-10-CM | POA: Diagnosis not present

## 2017-04-07 ENCOUNTER — Ambulatory Visit (HOSPITAL_BASED_OUTPATIENT_CLINIC_OR_DEPARTMENT_OTHER): Payer: Medicare Other

## 2017-04-07 ENCOUNTER — Encounter: Payer: Self-pay | Admitting: *Deleted

## 2017-04-07 ENCOUNTER — Encounter: Payer: Self-pay | Admitting: Internal Medicine

## 2017-04-07 ENCOUNTER — Other Ambulatory Visit: Payer: Medicare Other

## 2017-04-07 ENCOUNTER — Ambulatory Visit: Payer: Medicare Other | Admitting: Internal Medicine

## 2017-04-07 ENCOUNTER — Ambulatory Visit (HOSPITAL_BASED_OUTPATIENT_CLINIC_OR_DEPARTMENT_OTHER): Payer: Medicare Other | Admitting: Internal Medicine

## 2017-04-07 ENCOUNTER — Other Ambulatory Visit (HOSPITAL_BASED_OUTPATIENT_CLINIC_OR_DEPARTMENT_OTHER): Payer: Medicare Other

## 2017-04-07 ENCOUNTER — Telehealth: Payer: Self-pay | Admitting: Internal Medicine

## 2017-04-07 VITALS — BP 148/88 | HR 90 | Temp 98.0°F | Resp 20 | Ht 69.0 in | Wt 160.4 lb

## 2017-04-07 DIAGNOSIS — Z5111 Encounter for antineoplastic chemotherapy: Secondary | ICD-10-CM

## 2017-04-07 DIAGNOSIS — C3431 Malignant neoplasm of lower lobe, right bronchus or lung: Secondary | ICD-10-CM

## 2017-04-07 DIAGNOSIS — D6481 Anemia due to antineoplastic chemotherapy: Secondary | ICD-10-CM

## 2017-04-07 DIAGNOSIS — C3491 Malignant neoplasm of unspecified part of right bronchus or lung: Secondary | ICD-10-CM

## 2017-04-07 DIAGNOSIS — C7951 Secondary malignant neoplasm of bone: Secondary | ICD-10-CM

## 2017-04-07 DIAGNOSIS — G893 Neoplasm related pain (acute) (chronic): Secondary | ICD-10-CM | POA: Diagnosis not present

## 2017-04-07 DIAGNOSIS — T451X5A Adverse effect of antineoplastic and immunosuppressive drugs, initial encounter: Secondary | ICD-10-CM

## 2017-04-07 LAB — COMPREHENSIVE METABOLIC PANEL
ALT: 22 U/L (ref 0–55)
AST: 31 U/L (ref 5–34)
Albumin: 3.8 g/dL (ref 3.5–5.0)
Alkaline Phosphatase: 73 U/L (ref 40–150)
Anion Gap: 9 mEq/L (ref 3–11)
BUN: 17.8 mg/dL (ref 7.0–26.0)
CO2: 25 mEq/L (ref 22–29)
Calcium: 9.6 mg/dL (ref 8.4–10.4)
Chloride: 104 mEq/L (ref 98–109)
Creatinine: 1.2 mg/dL — ABNORMAL HIGH (ref 0.6–1.1)
EGFR: 47 mL/min/{1.73_m2} — ABNORMAL LOW (ref >60)
Glucose: 113 mg/dl (ref 70–140)
Potassium: 4.7 mEq/L (ref 3.5–5.1)
Sodium: 138 mEq/L (ref 136–145)
Total Bilirubin: 0.72 mg/dL (ref 0.20–1.20)
Total Protein: 7.5 g/dL (ref 6.4–8.3)

## 2017-04-07 LAB — CBC WITH DIFFERENTIAL/PLATELET
BASO%: 0.2 % (ref 0.0–2.0)
Basophils Absolute: 0 10*3/uL (ref 0.0–0.1)
EOS%: 0 % (ref 0.0–7.0)
Eosinophils Absolute: 0 10*3/uL (ref 0.0–0.5)
HCT: 34.3 % — ABNORMAL LOW (ref 34.8–46.6)
HGB: 11.7 g/dL (ref 11.6–15.9)
LYMPH%: 11.2 % — ABNORMAL LOW (ref 14.0–49.7)
MCH: 33.6 pg (ref 25.1–34.0)
MCHC: 34.1 g/dL (ref 31.5–36.0)
MCV: 98.6 fL (ref 79.5–101.0)
MONO#: 0.5 10*3/uL (ref 0.1–0.9)
MONO%: 7.7 % (ref 0.0–14.0)
NEUT#: 5 10*3/uL (ref 1.5–6.5)
NEUT%: 80.9 % — ABNORMAL HIGH (ref 38.4–76.8)
Platelets: 253 10*3/uL (ref 145–400)
RBC: 3.48 10*6/uL — ABNORMAL LOW (ref 3.70–5.45)
RDW: 14 % (ref 11.2–14.5)
WBC: 6.1 10*3/uL (ref 3.9–10.3)
lymph#: 0.7 10*3/uL — ABNORMAL LOW (ref 0.9–3.3)

## 2017-04-07 MED ORDER — SODIUM CHLORIDE 0.9% FLUSH
10.0000 mL | INTRAVENOUS | Status: DC | PRN
  Administered 2017-04-07: 10 mL
  Filled 2017-04-07: qty 10

## 2017-04-07 MED ORDER — CYANOCOBALAMIN 1000 MCG/ML IJ SOLN
INTRAMUSCULAR | Status: AC
  Filled 2017-04-07: qty 1

## 2017-04-07 MED ORDER — ZOLEDRONIC ACID 4 MG/100ML IV SOLN
4.0000 mg | Freq: Once | INTRAVENOUS | Status: AC
  Administered 2017-04-07: 4 mg via INTRAVENOUS
  Filled 2017-04-07: qty 100

## 2017-04-07 MED ORDER — PROCHLORPERAZINE MALEATE 10 MG PO TABS
ORAL_TABLET | ORAL | Status: AC
  Filled 2017-04-07: qty 1

## 2017-04-07 MED ORDER — CYANOCOBALAMIN 1000 MCG/ML IJ SOLN
1000.0000 ug | Freq: Once | INTRAMUSCULAR | Status: AC
  Administered 2017-04-07: 1000 ug via INTRAMUSCULAR

## 2017-04-07 MED ORDER — HEPARIN SOD (PORK) LOCK FLUSH 100 UNIT/ML IV SOLN
500.0000 [IU] | Freq: Once | INTRAVENOUS | Status: AC | PRN
  Administered 2017-04-07: 500 [IU]
  Filled 2017-04-07: qty 5

## 2017-04-07 MED ORDER — SODIUM CHLORIDE 0.9 % IV SOLN
Freq: Once | INTRAVENOUS | Status: AC
  Administered 2017-04-07: 11:00:00 via INTRAVENOUS

## 2017-04-07 MED ORDER — SODIUM CHLORIDE 0.9 % IV SOLN
525.0000 mg/m2 | Freq: Once | INTRAVENOUS | Status: AC
  Administered 2017-04-07: 1000 mg via INTRAVENOUS
  Filled 2017-04-07: qty 40

## 2017-04-07 MED ORDER — PROCHLORPERAZINE MALEATE 10 MG PO TABS
10.0000 mg | ORAL_TABLET | Freq: Once | ORAL | Status: AC
  Administered 2017-04-07: 10 mg via ORAL

## 2017-04-07 NOTE — Patient Instructions (Signed)
[facility] Discharge Instructions for Patients Receiving Chemotherapy  Today you received the following chemotherapy agents Alimta  To help prevent nausea and vomiting after your treatment, we encourage you to take your nausea medication as directed   If you develop nausea and vomiting that is not controlled by your nausea medication, call the clinic.   BELOW ARE SYMPTOMS THAT SHOULD BE REPORTED IMMEDIATELY:  *FEVER GREATER THAN 100.5 F  *CHILLS WITH OR WITHOUT FEVER  NAUSEA AND VOMITING THAT IS NOT CONTROLLED WITH YOUR NAUSEA MEDICATION  *UNUSUAL SHORTNESS OF BREATH  *UNUSUAL BRUISING OR BLEEDING  TENDERNESS IN MOUTH AND THROAT WITH OR WITHOUT PRESENCE OF ULCERS  *URINARY PROBLEMS  *BOWEL PROBLEMS  UNUSUAL RASH Items with * indicate a potential emergency and should be followed up as soon as possible.  Feel free to call the clinic should you have any questions or concerns. The clinic phone number is [phone].  Please show the [facility] at check-in to the Emergency Department and triage nurse.

## 2017-04-07 NOTE — Progress Notes (Signed)
[name] [place], MD [address] [address]  DIAGNOSIS: Stage IV (T1b, N2, M1b) non-small cell lung cancer, adenocarcinoma diagnosed in March 2017 and presented with right lower lobe lung nodule in addition to mediastinal lymphadenopathy and metastatic bone lesions.  Molecular studies: PDL 1 TPS  <1%.   [facility] One Studies: Positive for ERBB2 [id number]. Negative for EGFR, KRAS, ALK, BRAF, MET, RET and ROS1.  PRIOR THERAPY: Induction systemic chemotherapy with carboplatin for AUC of 5 and Alimta 500 MG/M2 every 3 weeks is status post 6 cycles at the [facility], last dose was given 03/05/2016 with stable disease.  CURRENT THERAPY::  1)  Maintenance systemic chemotherapy with Alimta 500 MG/M2 every 3 weeks status post 18 cycles. First dose was given 03/25/2016.  2) Zometa 4 mg IV every 12 week for metastatic bone disease.  INTERVAL HIST Valerie Long 71 y.o. female returns to the clinic today for follow-up visit accompanied by her sister.  The patient is feeling fine today with no specific complaints except for the low back pain.  She was seen by radiation oncology and expected to start a course of palliative radiotherapy to the painful metastatic bone lesion soon.  She denied having any chest pain, shortness breath, cough or hemoptysis.  She denied having any weight loss or night sweats.  She has no nausea, vomiting, diarrhea or constipation.  She continues to tolerate her treatment with maintenance Alimta fairly well.  The patient is here today for evaluation after repeating CT scan of the chest, abdomen and pelvis for restaging of her disease.  MEDICAL HISTORY: Past Medical History:  Diagnosis Date  . Adenocarcinoma of right lung, stage 4 ([place]) 2017  . Adenocarcinoma of right lung, stage 4 ([place])   . Anemia   . Arthritis   . Bone metastases  ([place]) 02/24/2017  . Encounter for antineoplastic chemotherapy 03/17/2016  . Hypercholesterolemia   . Hypertension 06/18/2016  . Osteopenia   . 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 Pneumonia   . Shortness of breath dyspnea     ALLERGIES:  has No Known Allergies.  MEDICATIONS:  Current Outpatient Prescriptions  Medication Sig Dispense Refill  . acetaminophen (TYLENOL) 500 MG tablet Take 1,000 mg by mouth every 6 (six) hours as needed for moderate pain or headache.    . Calcium Carb-Cholecalciferol (CALCIUM 600/VITAMIN D3 PO) Take by mouth.    . Cholecalciferol (VITAMIN D3 PO) Take 1 tablet by mouth 2 (two) times daily.     . Cyanocobalamin (B-12 PO) Take 2 tablets by mouth daily.    Marland Kitchen dexamethasone (DECADRON) 4 MG tablet Take 1 tablet (4 mg total) by mouth 2 (two) times daily. Take 45m by mouth twice daily the day before, of, and after chemo 40 tablet 0  . Fish Oil-Cholecalciferol (OMEGA-3 + VITAMIN D3 PO) Take 1 tablet by mouth daily.    . folic acid (FOLVITE) 1 MG tablet TAKE 1 TABLET BY MOUTH EVERY DAY 30 tablet 0  . lidocaine-prilocaine (EMLA) cream Apply 1 application topically as needed. Apply 1-2 tsp over port site 1-2 hours prior to chemo. 30 g 0  . ondansetron (ZOFRAN) 8 MG tablet Take 8 mg by mouth every 8 (eight) hours as needed.    .Marland KitchenPRESCRIPTION MEDICATION Inject 1 Dose as directed every 21 ( twenty-one) days. Chemo  No current facility-administered medications for this visit.     SURGICAL HISTORY:  Past Surgical History:  Procedure Laterality Date  . CESAREAN SECTION     myomectomy  . COLONOSCOPY W/ POLYPECTOMY    . IR GENERIC HISTORICAL  08/17/2016   IR US GUIDE VASC ACCESS RIGHT 08/17/2016 [name], MD [facility]  . IR GENERIC HISTORICAL  08/17/2016   IR FLUORO GUIDE PORT INSERTION RIGHT 08/17/2016 [name], MD [facility]  . MEDIASTINOSCOPY N/A 09/25/2015   Procedure: MEDIASTINOSCOPY;  Surgeon: Melrose Nakayama, MD;   Location: [place] [facility];  Service: Thoracic;  Laterality: N/A;  . VIDEO BRONCHOSCOPY Bilateral 08/19/2015   Procedure: VIDEO BRONCHOSCOPY WITH FLUORO;  Surgeon: Rigoberto Noel, MD;  Location: [facility];  Service: Cardiopulmonary;  Laterality: Bilateral;  . VIDEO BRONCHOSCOPY N/A 09/08/2015   Procedure: VIDEO BRONCHOSCOPY WITH FLUORO;  Surgeon: Rigoberto Noel, MD;  Location: [facility];  Service: Thoracic;  Laterality: N/A;  . VIDEO BRONCHOSCOPY WITH ENDOBRONCHIAL ULTRASOUND Right 09/08/2015   Procedure: ATTEMPTED VIDEO BRONCHOSCOPY ENDOBRONCHIAL ULTRASOUND  ;  Surgeon: Rigoberto Noel, MD;  Location: [facility];  Service: Thoracic;  Laterality: Right;    REVIEW OF SYSTEMS:  Constitutional: positive for fatigue Eyes: negative Ears, nose, mouth, throat, and face: negative Respiratory: negative Cardiovascular: negative Gastrointestinal: negative Genitourinary:negative Integument/breast: negative Hematologic/lymphatic: negative Musculoskeletal:positive for back pain Neurological: negative Behavioral/Psych: negative Endocrine: negative Allergic/Immunologic: negative   PHYSICAL EXAMINATION: General appearance: alert, cooperative, fatigued and no distress Head: Normocephalic, without obvious abnormality, atraumatic Neck: no adenopathy, no JVD, supple, symmetrical, trachea midline and thyroid not enlarged, symmetric, no tenderness/mass/nodules Lymph nodes: Cervical, supraclavicular, and axillary nodes normal. Resp: clear to auscultation bilaterally Back: symmetric, no curvature. ROM normal. No CVA tenderness. Cardio: regular rate and rhythm, S1, S2 normal, no murmur, click, rub or gallop GI: soft, non-tender; bowel sounds normal; no masses,  no organomegaly Extremities: extremities normal, atraumatic, no cyanosis or edema Neurologic: Alert and oriented X 3, normal strength and tone. Normal symmetric reflexes. Normal coordination and gait  ECOG PERFORMANCE STATUS: 1 - Symptomatic but completely ambulatory  Blood  pressure (!) 148/88, pulse 90, temperature 98 F (36.7 C), resp. rate 20, height 5' 9" (1.753 m), weight 160 lb 6.4 oz (72.8 kg), last menstrual period 06/09/1998, SpO2 100 %.  LABORATORY DATA: Lab Results  Component Value Date   WBC 6.1 04/07/2017   HGB 11.7 04/07/2017   HCT 34.3 (L) 04/07/2017   MCV 98.6 04/07/2017   PLT 253 04/07/2017      Chemistry      Component Value Date/Time   NA 138 04/07/2017 0817   K 4.7 04/07/2017 0817   CL 107 09/25/2015 0805   CO2 25 04/07/2017 0817   BUN 17.8 04/07/2017 0817   CREATININE 1.2 (H) 04/07/2017 0817   GLU 170 01/23/2016      Component Value Date/Time   CALCIUM 9.6 04/07/2017 0817   ALKPHOS 73 04/07/2017 0817   AST 31 04/07/2017 0817   ALT 22 04/07/2017 0817   BILITOT 0.72 04/07/2017 0817       RADIOGRAPHIC STUDIES: Ct Chest W Contrast  Result Date: 03/29/2017 CLINICAL DATA:  71 year old female with history lung cancer with metastatic disease to the bones diagnosed in March 2017, undergoing ongoing chemotherapy. Low back pain and bilateral buttock pain for the past several months, progressively worsening. EXAM: CT CHEST, ABDOMEN, AND PELVIS WITH CONTRAST TECHNIQUE: Multidetector CT imaging of the chest, abdomen and pelvis was performed following the standard protocol during bolus administration  of intravenous contrast. CONTRAST:  9m ISOVUE-300 IOPAMIDOL (ISOVUE-300) INJECTION 61% COMPARISON:  CT of the chest, abdomen and pelvis 02/01/2017. FINDINGS: CT CHEST FINDINGS Cardiovascular: Heart size is normal. There is no significant pericardial fluid, thickening or pericardial calcification. There is aortic atherosclerosis, as well as atherosclerosis of the great vessels of the mediastinum and the coronary arteries, including calcified atherosclerotic plaque in the left anterior descending and left circumflex coronary arteries. Single-lumen porta cath with tip to in the distal superior vena cava. Mediastinum/Nodes: No pathologically  enlarged mediastinal are hilar lymph nodes. Esophagus is unremarkable in appearance. No axillary lymphadenopathy. Lungs/Pleura: Again noted is a spiculated right lower lobe mass which currently measures 3.0 x 1.8 x 2.0 cm (axial image 80 of series 6 and coronal image 78 of series 4), which appears similar to slightly larger than the prior examination. Some mild postobstructive changes are noted in the more distal aspect of the right lower lobe anteriorly. There is also some mild scarring and chronic volume loss in the medial aspect of the right middle lobe. Multiple other tiny pulmonary nodules are noted throughout the lungs bilaterally, stable in size, number and distribution compared to prior examinations measuring up to 7 mm in the superior segment of the right lower lobe. No acute consolidative airspace disease. No pleural effusions. Mild septal thickening in the periphery of the left lung base. Musculoskeletal: Multiple predominantly sclerotic lesions are again noted throughout all aspects of the visualized axial and appendicular skeleton, compatible with widespread metastatic disease to the bones. Lesions are generally stable in size and number compared to the prior study. CT ABDOMEN PELVIS FINDINGS Hepatobiliary: Tiny subcentimeter low-attenuation lesions in segments 4A and 2 of the liver are too small to definitively characterize, but are similar to prior studies and statistically likely to represent tiny cysts. No other suspicious hepatic lesions. No intra or extrahepatic biliary ductal dilatation ends. Tiny calcified gallstones lying dependently in the gallbladder. No findings to suggest an acute cholecystitis at this time. Pancreas: No pancreatic mass. No pancreatic ductal dilatation. No pancreatic or peripancreatic fluid or inflammatory changes. Spleen: Unremarkable. Adrenals/Urinary Tract: Left-sided pelvic kidney (Normal anatomical variant) is again noted. Right kidney and bilateral adrenal glands are  normal in appearance. No hydroureteronephrosis. Urinary bladder is normal in appearance. Stomach/Bowel: Normal appearance of the stomach. Malrotation of the bowel again noted. No pathologic dilatation of small bowel or colon. Normal appendix. Vascular/Lymphatic: Aortic atherosclerosis, without evidence of aneurysm or dissection noted in the abdominal or pelvic vasculature. No lymphadenopathy noted in the abdomen or pelvis. Reproductive: Uterus and ovaries are unremarkable in appearance. Other: No significant volume of ascites.  No pneumoperitoneum. Musculoskeletal: Again noted are multiple sclerotic lesions throughout the visualized axial and appendicular skeleton, similar to the prior study. IMPRESSION: 1. Previously noted right lower lobe mass appears similar to slightly larger than the prior examination, currently measuring 3.0 x 1.8 x 2.0 cm. Other previously noted tiny pulmonary nodules are stable compared to prior examinations. Widespread metastatic disease to the bones also appears unchanged compared to prior examinations. 2. No new progressive metastatic disease noted elsewhere in the abdomen or pelvis. 3. Aortic atherosclerosis, in addition to two vessel coronary artery disease. Please note that although the presence of coronary artery calcium documents the presence of coronary artery disease, the severity of this disease and any potential stenosis cannot be assessed on this non-gated CT examination. Assessment for potential risk factor modification, dietary therapy or pharmacologic therapy may be warranted, if clinically indicated. 4. Cholelithiasis without evidence  of acute cholecystitis at this time. 5. Congenital bowel malrotation redemonstrated. 6. Additional incidental findings, as above. Electronically Signed   By: Vinnie Langton M.D.   On: 03/29/2017 16:50   Ct Abdomen Pelvis W Contrast  Result Date: 03/29/2017 CLINICAL DATA:  71 year old female with history lung cancer with metastatic  disease to the bones diagnosed in March 2017, undergoing ongoing chemotherapy. Low back pain and bilateral buttock pain for the past several months, progressively worsening. EXAM: CT CHEST, ABDOMEN, AND PELVIS WITH CONTRAST TECHNIQUE: Multidetector CT imaging of the chest, abdomen and pelvis was performed following the standard protocol during bolus administration of intravenous contrast. CONTRAST:  78m ISOVUE-300 IOPAMIDOL (ISOVUE-300) INJECTION 61% COMPARISON:  CT of the chest, abdomen and pelvis 02/01/2017. FINDINGS: CT CHEST FINDINGS Cardiovascular: Heart size is normal. There is no significant pericardial fluid, thickening or pericardial calcification. There is aortic atherosclerosis, as well as atherosclerosis of the great vessels of the mediastinum and the coronary arteries, including calcified atherosclerotic plaque in the left anterior descending and left circumflex coronary arteries. Single-lumen porta cath with tip to in the distal superior vena cava. Mediastinum/Nodes: No pathologically enlarged mediastinal are hilar lymph nodes. Esophagus is unremarkable in appearance. No axillary lymphadenopathy. Lungs/Pleura: Again noted is a spiculated right lower lobe mass which currently measures 3.0 x 1.8 x 2.0 cm (axial image 80 of series 6 and coronal image 78 of series 4), which appears similar to slightly larger than the prior examination. Some mild postobstructive changes are noted in the more distal aspect of the right lower lobe anteriorly. There is also some mild scarring and chronic volume loss in the medial aspect of the right middle lobe. Multiple other tiny pulmonary nodules are noted throughout the lungs bilaterally, stable in size, number and distribution compared to prior examinations measuring up to 7 mm in the superior segment of the right lower lobe. No acute consolidative airspace disease. No pleural effusions. Mild septal thickening in the periphery of the left lung base. Musculoskeletal:  Multiple predominantly sclerotic lesions are again noted throughout all aspects of the visualized axial and appendicular skeleton, compatible with widespread metastatic disease to the bones. Lesions are generally stable in size and number compared to the prior study. CT ABDOMEN PELVIS FINDINGS Hepatobiliary: Tiny subcentimeter low-attenuation lesions in segments 4A and 2 of the liver are too small to definitively characterize, but are similar to prior studies and statistically likely to represent tiny cysts. No other suspicious hepatic lesions. No intra or extrahepatic biliary ductal dilatation ends. Tiny calcified gallstones lying dependently in the gallbladder. No findings to suggest an acute cholecystitis at this time. Pancreas: No pancreatic mass. No pancreatic ductal dilatation. No pancreatic or peripancreatic fluid or inflammatory changes. Spleen: Unremarkable. Adrenals/Urinary Tract: Left-sided pelvic kidney (Normal anatomical variant) is again noted. Right kidney and bilateral adrenal glands are normal in appearance. No hydroureteronephrosis. Urinary bladder is normal in appearance. Stomach/Bowel: Normal appearance of the stomach. Malrotation of the bowel again noted. No pathologic dilatation of small bowel or colon. Normal appendix. Vascular/Lymphatic: Aortic atherosclerosis, without evidence of aneurysm or dissection noted in the abdominal or pelvic vasculature. No lymphadenopathy noted in the abdomen or pelvis. Reproductive: Uterus and ovaries are unremarkable in appearance. Other: No significant volume of ascites.  No pneumoperitoneum. Musculoskeletal: Again noted are multiple sclerotic lesions throughout the visualized axial and appendicular skeleton, similar to the prior study. IMPRESSION: 1. Previously noted right lower lobe mass appears similar to slightly larger than the prior examination, currently measuring 3.0 x 1.8 x 2.0  cm. Other previously noted tiny pulmonary nodules are stable compared to  prior examinations. Widespread metastatic disease to the bones also appears unchanged compared to prior examinations. 2. No new progressive metastatic disease noted elsewhere in the abdomen or pelvis. 3. Aortic atherosclerosis, in addition to two vessel coronary artery disease. Please note that although the presence of coronary artery calcium documents the presence of coronary artery disease, the severity of this disease and any potential stenosis cannot be assessed on this non-gated CT examination. Assessment for potential risk factor modification, dietary therapy or pharmacologic therapy may be warranted, if clinically indicated. 4. Cholelithiasis without evidence of acute cholecystitis at this time. 5. Congenital bowel malrotation redemonstrated. 6. Additional incidental findings, as above. Electronically Signed   By: Vinnie Langton M.D.   On: 03/29/2017 16:50    ASSESSMENT AND PLAN:  This is a very pleasant 71 years old Hispanic female with metastatic non-small cell lung cancer, adenocarcinoma status post induction systemic chemotherapy with carboplatin and Alimta and she is currently on maintenance treatment with single agent Alimta status post 18 cycles. The patient has been tolerating her treatment fairly well with no significant adverse effects. She had repeat CT scan of the chest, abdomen and pelvis performed recently.  I personally and independently reviewed the scan images and discussed the results and showed the images to the patient and her sister. I explained to the patient that her disease is very stable except for a slightly enlarging right lower lobe lung mass.  I also showed her the osseous metastatic disease involving the spines as well as the pelvic area. I recommended for the patient to continue her current treatment with maintenance Alimta and she would proceed with cycle #19 today. For the painful metastatic bone disease and the hip area, she will undergo palliative radiotherapy  very soon. I will see her back for follow-up visit in 3 weeks for evaluation before starting cycle #20. The patient was advised to call immediately if she has any concerning symptoms in the interval. The patient voices understanding of current disease status and treatment options and is in agreement with the current care plan. All questions were answered. The patient knows to call the clinic with any problems, questions or concerns. We can certainly see the patient much sooner if necessary.  Disclaimer: This note was dictated with voice recognition software. Similar sounding words can inadvertently be transcribed and may not be corrected upon review.

## 2017-04-07 NOTE — Telephone Encounter (Signed)
Patient already on schedule for lab/flush/fu/tx q3w thru January

## 2017-04-14 ENCOUNTER — Ambulatory Visit
Admission: RE | Admit: 2017-04-14 | Discharge: 2017-04-14 | Disposition: A | Discharge status: Home / Self Care | Payer: Medicare Other | Source: Ambulatory Visit | Attending: Radiation Oncology | Admitting: Radiation Oncology

## 2017-04-14 DIAGNOSIS — E7800 Pure hypercholesterolemia, unspecified: Secondary | ICD-10-CM | POA: Diagnosis not present

## 2017-04-14 DIAGNOSIS — C3491 Malignant neoplasm of unspecified part of right bronchus or lung: Secondary | ICD-10-CM | POA: Diagnosis not present

## 2017-04-14 DIAGNOSIS — C7801 Secondary malignant neoplasm of right lung: Secondary | ICD-10-CM | POA: Diagnosis not present

## 2017-04-14 DIAGNOSIS — Z510 Encounter for antineoplastic radiation therapy: Secondary | ICD-10-CM | POA: Diagnosis not present

## 2017-04-14 DIAGNOSIS — C7951 Secondary malignant neoplasm of bone: Secondary | ICD-10-CM | POA: Diagnosis not present

## 2017-04-14 DIAGNOSIS — I10 Essential (primary) hypertension: Secondary | ICD-10-CM | POA: Diagnosis not present

## 2017-04-15 ENCOUNTER — Ambulatory Visit
Admission: RE | Admit: 2017-04-15 | Discharge: 2017-04-15 | Disposition: A | Discharge status: Home / Self Care | Payer: Medicare Other | Source: Ambulatory Visit | Attending: Radiation Oncology | Admitting: Radiation Oncology

## 2017-04-15 DIAGNOSIS — C3491 Malignant neoplasm of unspecified part of right bronchus or lung: Secondary | ICD-10-CM | POA: Diagnosis not present

## 2017-04-15 DIAGNOSIS — Z510 Encounter for antineoplastic radiation therapy: Secondary | ICD-10-CM | POA: Diagnosis not present

## 2017-04-15 DIAGNOSIS — C7951 Secondary malignant neoplasm of bone: Secondary | ICD-10-CM | POA: Diagnosis not present

## 2017-04-15 DIAGNOSIS — I10 Essential (primary) hypertension: Secondary | ICD-10-CM | POA: Diagnosis not present

## 2017-04-15 DIAGNOSIS — C7801 Secondary malignant neoplasm of right lung: Secondary | ICD-10-CM | POA: Diagnosis not present

## 2017-04-15 DIAGNOSIS — E7800 Pure hypercholesterolemia, unspecified: Secondary | ICD-10-CM | POA: Diagnosis not present

## 2017-04-18 ENCOUNTER — Ambulatory Visit
Admission: RE | Admit: 2017-04-18 | Discharge: 2017-04-18 | Disposition: A | Discharge status: Home / Self Care | Payer: Medicare Other | Source: Ambulatory Visit | Attending: Radiation Oncology | Admitting: Radiation Oncology

## 2017-04-18 DIAGNOSIS — Z510 Encounter for antineoplastic radiation therapy: Secondary | ICD-10-CM | POA: Diagnosis not present

## 2017-04-18 DIAGNOSIS — C7951 Secondary malignant neoplasm of bone: Secondary | ICD-10-CM | POA: Diagnosis not present

## 2017-04-18 DIAGNOSIS — E7800 Pure hypercholesterolemia, unspecified: Secondary | ICD-10-CM | POA: Diagnosis not present

## 2017-04-18 DIAGNOSIS — C3491 Malignant neoplasm of unspecified part of right bronchus or lung: Secondary | ICD-10-CM | POA: Diagnosis not present

## 2017-04-18 DIAGNOSIS — C7801 Secondary malignant neoplasm of right lung: Secondary | ICD-10-CM | POA: Diagnosis not present

## 2017-04-18 DIAGNOSIS — I10 Essential (primary) hypertension: Secondary | ICD-10-CM | POA: Diagnosis not present

## 2017-04-19 ENCOUNTER — Ambulatory Visit
Admission: RE | Admit: 2017-04-19 | Discharge: 2017-04-19 | Disposition: A | Discharge status: Home / Self Care | Payer: Medicare Other | Source: Ambulatory Visit | Attending: Radiation Oncology | Admitting: Radiation Oncology

## 2017-04-19 DIAGNOSIS — C7801 Secondary malignant neoplasm of right lung: Secondary | ICD-10-CM | POA: Diagnosis not present

## 2017-04-19 DIAGNOSIS — Z510 Encounter for antineoplastic radiation therapy: Secondary | ICD-10-CM | POA: Diagnosis not present

## 2017-04-19 DIAGNOSIS — C7951 Secondary malignant neoplasm of bone: Secondary | ICD-10-CM | POA: Diagnosis not present

## 2017-04-19 DIAGNOSIS — C3491 Malignant neoplasm of unspecified part of right bronchus or lung: Secondary | ICD-10-CM | POA: Diagnosis not present

## 2017-04-19 DIAGNOSIS — E7800 Pure hypercholesterolemia, unspecified: Secondary | ICD-10-CM | POA: Diagnosis not present

## 2017-04-19 DIAGNOSIS — I10 Essential (primary) hypertension: Secondary | ICD-10-CM | POA: Diagnosis not present

## 2017-04-20 ENCOUNTER — Telehealth: Payer: Self-pay

## 2017-04-20 ENCOUNTER — Ambulatory Visit
Admission: RE | Admit: 2017-04-20 | Discharge: 2017-04-20 | Disposition: A | Discharge status: Home / Self Care | Payer: Medicare Other | Source: Ambulatory Visit | Attending: Radiation Oncology | Admitting: Radiation Oncology

## 2017-04-20 DIAGNOSIS — C7801 Secondary malignant neoplasm of right lung: Secondary | ICD-10-CM | POA: Diagnosis not present

## 2017-04-20 DIAGNOSIS — E7800 Pure hypercholesterolemia, unspecified: Secondary | ICD-10-CM | POA: Diagnosis not present

## 2017-04-20 DIAGNOSIS — C3491 Malignant neoplasm of unspecified part of right bronchus or lung: Secondary | ICD-10-CM | POA: Diagnosis not present

## 2017-04-20 DIAGNOSIS — Z510 Encounter for antineoplastic radiation therapy: Secondary | ICD-10-CM | POA: Diagnosis not present

## 2017-04-20 DIAGNOSIS — C7951 Secondary malignant neoplasm of bone: Secondary | ICD-10-CM | POA: Diagnosis not present

## 2017-04-20 DIAGNOSIS — I10 Essential (primary) hypertension: Secondary | ICD-10-CM | POA: Diagnosis not present

## 2017-04-20 NOTE — Progress Notes (Signed)
  Radiation Oncology         [phone] ________________________________  Name: Jalisha Enneking MRN: [id number]  Date: 04/06/2017  DOB: 1945/10/04  SIMULATION AND TREATMENT PLANNING NOTE  DIAGNOSIS:     ICD-10-CM   1. Bone metastases ([place]) C79.51      Site: Lumbar spine  NARRATIVE:  The patient was brought to the [facility] [facility].  Identity was confirmed.  All relevant records and images related to the planned course of therapy were reviewed.   Written consent to proceed with treatment was confirmed which was freely given after reviewing the details related to the planned course of therapy had been reviewed with the patient.  Then, the patient was set-up in a stable reproducible  supine position for radiation therapy.  CT images were obtained.  Surface markings were placed.    Medically necessary complex treatment device(s) for immobilization: Customized Vac-Lok bag.   The CT images were loaded into the planning software.  Then the target and avoidance structures were contoured.  Treatment planning then occurred.  The radiation prescription was entered and confirmed.  A total of 2 complex treatment devices were fabricated which relate to the designed radiation treatment fields. Each of these customized fields/ complex treatment devices will be used on a daily basis during the radiation course. I have requested : Isodose Plan.   PLAN:  The patient will receive 30 Gy in 10 fractions.  ________________________________   Jodelle Gross, MD, PhD

## 2017-04-20 NOTE — Telephone Encounter (Signed)
Called and left a detailed message concerning upcoming appointment change made for provider on 11/23. Patient will be seen by Valerie Long on that day. Per 11/14 sch message

## 2017-04-20 NOTE — Telephone Encounter (Signed)
Appointment scheduled with Erasmo Downer as the provider for 11/23. Per 11/14 change from lisa as a error

## 2017-04-21 ENCOUNTER — Ambulatory Visit
Admission: RE | Admit: 2017-04-21 | Discharge: 2017-04-21 | Disposition: A | Discharge status: Home / Self Care | Payer: Medicare Other | Source: Ambulatory Visit | Attending: Radiation Oncology | Admitting: Radiation Oncology

## 2017-04-21 DIAGNOSIS — E7800 Pure hypercholesterolemia, unspecified: Secondary | ICD-10-CM | POA: Diagnosis not present

## 2017-04-21 DIAGNOSIS — C7951 Secondary malignant neoplasm of bone: Secondary | ICD-10-CM | POA: Diagnosis not present

## 2017-04-21 DIAGNOSIS — Z510 Encounter for antineoplastic radiation therapy: Secondary | ICD-10-CM | POA: Diagnosis not present

## 2017-04-21 DIAGNOSIS — C3491 Malignant neoplasm of unspecified part of right bronchus or lung: Secondary | ICD-10-CM | POA: Diagnosis not present

## 2017-04-21 DIAGNOSIS — C7801 Secondary malignant neoplasm of right lung: Secondary | ICD-10-CM | POA: Diagnosis not present

## 2017-04-21 DIAGNOSIS — I10 Essential (primary) hypertension: Secondary | ICD-10-CM | POA: Diagnosis not present

## 2017-04-22 ENCOUNTER — Ambulatory Visit
Admission: RE | Admit: 2017-04-22 | Discharge: 2017-04-22 | Disposition: A | Discharge status: Home / Self Care | Payer: Medicare Other | Source: Ambulatory Visit | Attending: Radiation Oncology | Admitting: Radiation Oncology

## 2017-04-22 DIAGNOSIS — C7951 Secondary malignant neoplasm of bone: Secondary | ICD-10-CM | POA: Diagnosis not present

## 2017-04-22 DIAGNOSIS — C3491 Malignant neoplasm of unspecified part of right bronchus or lung: Secondary | ICD-10-CM | POA: Diagnosis not present

## 2017-04-22 DIAGNOSIS — Z510 Encounter for antineoplastic radiation therapy: Secondary | ICD-10-CM | POA: Diagnosis not present

## 2017-04-22 DIAGNOSIS — E7800 Pure hypercholesterolemia, unspecified: Secondary | ICD-10-CM | POA: Diagnosis not present

## 2017-04-22 DIAGNOSIS — I10 Essential (primary) hypertension: Secondary | ICD-10-CM | POA: Diagnosis not present

## 2017-04-22 DIAGNOSIS — C7801 Secondary malignant neoplasm of right lung: Secondary | ICD-10-CM | POA: Diagnosis not present

## 2017-04-24 ENCOUNTER — Ambulatory Visit
Admission: RE | Admit: 2017-04-24 | Discharge: 2017-04-24 | Disposition: A | Discharge status: Home / Self Care | Payer: Medicare Other | Source: Ambulatory Visit | Attending: Radiation Oncology | Admitting: Radiation Oncology

## 2017-04-24 DIAGNOSIS — C7801 Secondary malignant neoplasm of right lung: Secondary | ICD-10-CM | POA: Diagnosis not present

## 2017-04-24 DIAGNOSIS — I10 Essential (primary) hypertension: Secondary | ICD-10-CM | POA: Diagnosis not present

## 2017-04-24 DIAGNOSIS — C7951 Secondary malignant neoplasm of bone: Secondary | ICD-10-CM | POA: Diagnosis not present

## 2017-04-24 DIAGNOSIS — E7800 Pure hypercholesterolemia, unspecified: Secondary | ICD-10-CM | POA: Diagnosis not present

## 2017-04-24 DIAGNOSIS — C3491 Malignant neoplasm of unspecified part of right bronchus or lung: Secondary | ICD-10-CM | POA: Diagnosis not present

## 2017-04-24 DIAGNOSIS — Z510 Encounter for antineoplastic radiation therapy: Secondary | ICD-10-CM | POA: Diagnosis not present

## 2017-04-25 ENCOUNTER — Ambulatory Visit: Payer: Medicare Other

## 2017-04-25 ENCOUNTER — Ambulatory Visit
Admission: RE | Admit: 2017-04-25 | Discharge: 2017-04-25 | Disposition: A | Discharge status: Home / Self Care | Payer: Medicare Other | Source: Ambulatory Visit | Attending: Radiation Oncology | Admitting: Radiation Oncology

## 2017-04-25 DIAGNOSIS — C7951 Secondary malignant neoplasm of bone: Secondary | ICD-10-CM | POA: Diagnosis not present

## 2017-04-25 DIAGNOSIS — E7800 Pure hypercholesterolemia, unspecified: Secondary | ICD-10-CM | POA: Diagnosis not present

## 2017-04-25 DIAGNOSIS — I10 Essential (primary) hypertension: Secondary | ICD-10-CM | POA: Diagnosis not present

## 2017-04-25 DIAGNOSIS — C7801 Secondary malignant neoplasm of right lung: Secondary | ICD-10-CM | POA: Diagnosis not present

## 2017-04-25 DIAGNOSIS — Z510 Encounter for antineoplastic radiation therapy: Secondary | ICD-10-CM | POA: Diagnosis not present

## 2017-04-25 DIAGNOSIS — C3491 Malignant neoplasm of unspecified part of right bronchus or lung: Secondary | ICD-10-CM | POA: Diagnosis not present

## 2017-04-26 ENCOUNTER — Ambulatory Visit: Payer: Medicare Other

## 2017-04-26 ENCOUNTER — Encounter: Payer: Self-pay | Admitting: Radiation Oncology

## 2017-04-26 ENCOUNTER — Ambulatory Visit
Admission: RE | Admit: 2017-04-26 | Discharge: 2017-04-26 | Disposition: A | Discharge status: Home / Self Care | Payer: Medicare Other | Source: Ambulatory Visit | Attending: Radiation Oncology | Admitting: Radiation Oncology

## 2017-04-26 DIAGNOSIS — E7800 Pure hypercholesterolemia, unspecified: Secondary | ICD-10-CM | POA: Diagnosis not present

## 2017-04-26 DIAGNOSIS — C7951 Secondary malignant neoplasm of bone: Secondary | ICD-10-CM | POA: Diagnosis not present

## 2017-04-26 DIAGNOSIS — I10 Essential (primary) hypertension: Secondary | ICD-10-CM | POA: Diagnosis not present

## 2017-04-26 DIAGNOSIS — C7801 Secondary malignant neoplasm of right lung: Secondary | ICD-10-CM | POA: Diagnosis not present

## 2017-04-26 DIAGNOSIS — Z510 Encounter for antineoplastic radiation therapy: Secondary | ICD-10-CM | POA: Diagnosis not present

## 2017-04-26 DIAGNOSIS — C3491 Malignant neoplasm of unspecified part of right bronchus or lung: Secondary | ICD-10-CM | POA: Diagnosis not present

## 2017-04-27 ENCOUNTER — Ambulatory Visit: Payer: Medicare Other

## 2017-04-29 ENCOUNTER — Ambulatory Visit (HOSPITAL_BASED_OUTPATIENT_CLINIC_OR_DEPARTMENT_OTHER): Payer: Medicare Other | Admitting: Oncology

## 2017-04-29 ENCOUNTER — Other Ambulatory Visit: Payer: Medicare Other

## 2017-04-29 ENCOUNTER — Ambulatory Visit (HOSPITAL_BASED_OUTPATIENT_CLINIC_OR_DEPARTMENT_OTHER): Payer: Medicare Other

## 2017-04-29 ENCOUNTER — Ambulatory Visit: Payer: Medicare Other

## 2017-04-29 ENCOUNTER — Encounter: Payer: Self-pay | Admitting: Oncology

## 2017-04-29 ENCOUNTER — Ambulatory Visit: Payer: Medicare Other | Admitting: Oncology

## 2017-04-29 ENCOUNTER — Other Ambulatory Visit: Payer: Self-pay | Admitting: Oncology

## 2017-04-29 ENCOUNTER — Other Ambulatory Visit (HOSPITAL_BASED_OUTPATIENT_CLINIC_OR_DEPARTMENT_OTHER): Payer: Medicare Other

## 2017-04-29 ENCOUNTER — Ambulatory Visit: Payer: Medicare Other | Admitting: Internal Medicine

## 2017-04-29 DIAGNOSIS — G893 Neoplasm related pain (acute) (chronic): Secondary | ICD-10-CM | POA: Diagnosis not present

## 2017-04-29 DIAGNOSIS — C7951 Secondary malignant neoplasm of bone: Secondary | ICD-10-CM | POA: Diagnosis not present

## 2017-04-29 DIAGNOSIS — C3491 Malignant neoplasm of unspecified part of right bronchus or lung: Secondary | ICD-10-CM

## 2017-04-29 DIAGNOSIS — Z5111 Encounter for antineoplastic chemotherapy: Secondary | ICD-10-CM | POA: Diagnosis not present

## 2017-04-29 DIAGNOSIS — C3431 Malignant neoplasm of lower lobe, right bronchus or lung: Secondary | ICD-10-CM

## 2017-04-29 DIAGNOSIS — D6481 Anemia due to antineoplastic chemotherapy: Secondary | ICD-10-CM | POA: Diagnosis not present

## 2017-04-29 DIAGNOSIS — C3490 Malignant neoplasm of unspecified part of unspecified bronchus or lung: Secondary | ICD-10-CM

## 2017-04-29 DIAGNOSIS — Z95828 Presence of other vascular implants and grafts: Secondary | ICD-10-CM

## 2017-04-29 DIAGNOSIS — T451X5A Adverse effect of antineoplastic and immunosuppressive drugs, initial encounter: Secondary | ICD-10-CM

## 2017-04-29 LAB — CBC WITH DIFFERENTIAL/PLATELET
BASO%: 0.3 % (ref 0.0–2.0)
Basophils Absolute: 0 10*3/uL (ref 0.0–0.1)
EOS%: 1.6 % (ref 0.0–7.0)
Eosinophils Absolute: 0.1 10*3/uL (ref 0.0–0.5)
HCT: 29.8 % — ABNORMAL LOW (ref 34.8–46.6)
HGB: 9.8 g/dL — ABNORMAL LOW (ref 11.6–15.9)
LYMPH%: 15 % (ref 14.0–49.7)
MCH: 33.1 pg (ref 25.1–34.0)
MCHC: 32.9 g/dL (ref 31.5–36.0)
MCV: 100.7 fL (ref 79.5–101.0)
MONO#: 0.7 10*3/uL (ref 0.1–0.9)
MONO%: 19.1 % — ABNORMAL HIGH (ref 0.0–14.0)
NEUT#: 2.4 10*3/uL (ref 1.5–6.5)
NEUT%: 64 % (ref 38.4–76.8)
Platelets: 176 10*3/uL (ref 145–400)
RBC: 2.96 10*6/uL — ABNORMAL LOW (ref 3.70–5.45)
RDW: 14.3 % (ref 11.2–14.5)
WBC: 3.7 10*3/uL — ABNORMAL LOW (ref 3.9–10.3)
lymph#: 0.6 10*3/uL — ABNORMAL LOW (ref 0.9–3.3)

## 2017-04-29 LAB — COMPREHENSIVE METABOLIC PANEL
ALT: 14 U/L (ref 0–55)
AST: 25 U/L (ref 5–34)
Albumin: 3.5 g/dL (ref 3.5–5.0)
Alkaline Phosphatase: 49 U/L (ref 40–150)
Anion Gap: 8 mEq/L (ref 3–11)
BUN: 16.6 mg/dL (ref 7.0–26.0)
CO2: 23 mEq/L (ref 22–29)
Calcium: 9.2 mg/dL (ref 8.4–10.4)
Chloride: 106 mEq/L (ref 98–109)
Creatinine: 1 mg/dL (ref 0.6–1.1)
EGFR: 54 mL/min/{1.73_m2} — ABNORMAL LOW (ref >60)
Glucose: 82 mg/dl (ref 70–140)
Potassium: 3.8 mEq/L (ref 3.5–5.1)
Sodium: 138 mEq/L (ref 136–145)
Total Bilirubin: 0.72 mg/dL (ref 0.20–1.20)
Total Protein: 6.7 g/dL (ref 6.4–8.3)

## 2017-04-29 LAB — RESEARCH LABS

## 2017-04-29 MED ORDER — DEXAMETHASONE 4 MG PO TABS
4.0000 mg | ORAL_TABLET | Freq: Once | ORAL | Status: AC
  Administered 2017-04-29: 4 mg via ORAL

## 2017-04-29 MED ORDER — DEXAMETHASONE 4 MG PO TABS: ORAL_TABLET | ORAL | 1 refills | Status: DC

## 2017-04-29 MED ORDER — SODIUM CHLORIDE 0.9 % IV SOLN
Freq: Once | INTRAVENOUS | Status: AC
  Administered 2017-04-29: 12:00:00 via INTRAVENOUS

## 2017-04-29 MED ORDER — SODIUM CHLORIDE 0.9% FLUSH
10.0000 mL | INTRAVENOUS | Status: DC | PRN
  Administered 2017-04-29: 10 mL
  Filled 2017-04-29: qty 10

## 2017-04-29 MED ORDER — PROCHLORPERAZINE MALEATE 10 MG PO TABS
10.0000 mg | ORAL_TABLET | Freq: Once | ORAL | Status: AC
  Administered 2017-04-29: 10 mg via ORAL

## 2017-04-29 MED ORDER — DEXAMETHASONE 4 MG PO TABS
ORAL_TABLET | ORAL | Status: AC
  Filled 2017-04-29: qty 1

## 2017-04-29 MED ORDER — SODIUM CHLORIDE 0.9 % IV SOLN
525.0000 mg/m2 | Freq: Once | INTRAVENOUS | Status: AC
  Administered 2017-04-29: 1000 mg via INTRAVENOUS
  Filled 2017-04-29: qty 40

## 2017-04-29 MED ORDER — SODIUM CHLORIDE 0.9% FLUSH
10.0000 mL | INTRAVENOUS | Status: DC | PRN
  Administered 2017-04-29: 10 mL via INTRAVENOUS
  Filled 2017-04-29: qty 10

## 2017-04-29 MED ORDER — PROCHLORPERAZINE MALEATE 10 MG PO TABS
ORAL_TABLET | ORAL | Status: AC
  Filled 2017-04-29: qty 1

## 2017-04-29 MED ORDER — HEPARIN SOD (PORK) LOCK FLUSH 100 UNIT/ML IV SOLN
500.0000 [IU] | Freq: Once | INTRAVENOUS | Status: AC | PRN
Start: 2017-04-29 — End: 2017-04-29
  Administered 2017-04-29: 500 [IU]
  Filled 2017-04-29: qty 5

## 2017-04-29 MED ORDER — FOLIC ACID 1 MG PO TABS: 1.0000 mg | ORAL_TABLET | Freq: Every day | ORAL | 1 refills | Status: DC

## 2017-04-29 NOTE — Progress Notes (Signed)
[place], MD [address] [place] [address]  DIAGNOSIS: Stage IV (T1b, N2, M1b) non-small cell lung cancer, adenocarcinoma diagnosed in March 2017 and presented with right lower lobe lung nodule in addition to mediastinal lymphadenopathy and metastatic bone lesions.  Molecular studies:PDL 1 TPS <1%.   Foundation One Studies: Positive for ERBB2 [id number]. Negative for EGFR, KRAS, ALK, BRAF, MET, RET and ROS1.  PRIOR THERAPY: Induction systemic chemotherapy with carboplatin for AUC of 5 and Alimta 500 MG/M2 every 3 weeks is status post 6 cycles at the [facility], last dose was given 03/05/2016 with stable disease.  CURRENT THERAPY: 1)  Maintenance systemic chemotherapy with Alimta 500 MG/M2 every 3 weeks status post 18 cycles. First dose was given 03/25/2016.  2) Zometa 4 mg IV every 12 week for metastatic bone disease.  INTERVAL HISTORY: Valerie Long 71 y.o. female returns for a routine follow-up visit accompanied by her sister. The patient is feeling fine today with no specific complaints except for the low back pain which is improving since radiation.    She completed radiation to a painful metastatic bone lesion earlier this week.    Denies fevers and chills.  She denied having any chest pain, shortness breath, cough or hemoptysis.  She denied having any weight loss or night sweats.  She has no nausea, vomiting, diarrhea or constipation.  She continues to tolerate her treatment with maintenance Alimta fairly well.  The patient is here for evaluation prior to cycle #20 of her dense Alimta.  MEDICAL HISTORY: Past Medical History:  Diagnosis Date  . Adenocarcinoma of right lung, stage 4 ([place]) 2017  . Adenocarcinoma of right lung, stage 4 ([place])   . Anemia   . Arthritis   . Bone metastases ([place]) 02/24/2017  . Encounter for antineoplastic chemotherapy 03/17/2016  . Hypercholesterolemia   .  Hypertension 06/18/2016  . Osteopenia   . 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 Pneumonia   . Shortness of breath dyspnea     ALLERGIES:  has No Known Allergies.  MEDICATIONS:  Current Outpatient Medications  Medication Sig Dispense Refill  . acetaminophen (TYLENOL) 500 MG tablet Take 1,000 mg by mouth every 6 (six) hours as needed for moderate pain or headache.    . Calcium Carb-Cholecalciferol (CALCIUM 600/VITAMIN D3 PO) Take by mouth.    . Cholecalciferol (VITAMIN D3 PO) Take 1 tablet by mouth 2 (two) times daily.     . Cyanocobalamin (B-12 PO) Take 2 tablets by mouth daily.    Marland Kitchen dexamethasone (DECADRON) 4 MG tablet Take 75m by mouth twice daily the day before, of, and after chemo 40 tablet 1  . Fish Oil-Cholecalciferol (OMEGA-3 + VITAMIN D3 PO) Take 1 tablet by mouth daily.    . folic acid (FOLVITE) 1 MG tablet Take 1 tablet (1 mg total) by mouth daily. 90 tablet 1  . lidocaine-prilocaine (EMLA) cream Apply 1 application topically as needed. Apply 1-2 tsp over port site 1-2 hours prior to chemo. 30 g 0  . ondansetron (ZOFRAN) 8 MG tablet Take 8 mg by mouth every 8 (eight) hours as needed.    .Marland KitchenPRESCRIPTION MEDICATION Inject 1 Dose as directed every 21 ( twenty-one) days. Chemo     No current facility-administered medications for this visit.    Facility-Administered Medications Ordered in Other Visits  Medication Dose Route Frequency Provider Last Rate Last Dose  .  heparin lock flush 100 unit/mL  500 Units Intracatheter Once PRN Curt Bears, MD      . PEMEtrexed (ALIMTA) 1,000 mg in sodium chloride 0.9 % 100 mL chemo infusion  525 mg/m2 (Treatment Plan Recorded) Intravenous Once Curt Bears, MD      . sodium chloride flush (NS) 0.9 % injection 10 mL  10 mL Intracatheter PRN Curt Bears, MD        SURGICAL HISTORY:  Past Surgical History:  Procedure Laterality Date  . CESAREAN SECTION     myomectomy  . COLONOSCOPY W/ POLYPECTOMY    . IR GENERIC  HISTORICAL  08/17/2016   IR US GUIDE VASC ACCESS RIGHT 08/17/2016 [name], MD [facility]  . IR GENERIC HISTORICAL  08/17/2016   IR FLUORO GUIDE PORT INSERTION RIGHT 08/17/2016 [name], MD [facility]  . MEDIASTINOSCOPY N/A 09/25/2015   Procedure: MEDIASTINOSCOPY;  Surgeon: Melrose Nakayama, MD;  Location: [place];  Service: Thoracic;  Laterality: N/A;  . VIDEO BRONCHOSCOPY Bilateral 08/19/2015   Procedure: VIDEO BRONCHOSCOPY WITH FLUORO;  Surgeon: Rigoberto Noel, MD;  Location: [place];  Service: Cardiopulmonary;  Laterality: Bilateral;  . VIDEO BRONCHOSCOPY N/A 09/08/2015   Procedure: VIDEO BRONCHOSCOPY WITH FLUORO;  Surgeon: Rigoberto Noel, MD;  Location: [place];  Service: Thoracic;  Laterality: N/A;  . VIDEO BRONCHOSCOPY WITH ENDOBRONCHIAL ULTRASOUND Right 09/08/2015   Procedure: ATTEMPTED VIDEO BRONCHOSCOPY ENDOBRONCHIAL ULTRASOUND  ;  Surgeon: Rigoberto Noel, MD;  Location: [place];  Service: Thoracic;  Laterality: Right;    REVIEW OF SYSTEMS:   Review of Systems  Constitutional: Negative for appetite change, chills, fatigue, fever and unexpected weight change.  HENT:   Negative for mouth sores, nosebleeds, sore throat and trouble swallowing.   Eyes: Negative for eye problems and icterus.  Respiratory: Negative for cough, hemoptysis, shortness of breath and wheezing.   Cardiovascular: Negative for chest pain and leg swelling.  Gastrointestinal: Negative for abdominal pain, constipation, diarrhea, nausea and vomiting.  Genitourinary: Negative for bladder incontinence, difficulty urinating, dysuria, frequency and hematuria.   Musculoskeletal: Negative gait problem, neck pain and neck stiffness. Positive for back pain which is improving. Skin: Negative for itching and rash.  Neurological: Negative for dizziness, extremity weakness, gait problem, headaches, light-headedness and seizures.  Hematological: Negative for adenopathy. Does not bruise/bleed easily.   Psychiatric/Behavioral: Negative for confusion, depression and sleep disturbance. The patient is not nervous/anxious.     PHYSICAL EXAMINATION:  Blood pressure (!) 145/75, pulse 76, temperature 97.9 F (36.6 C), temperature source Oral, resp. rate 16, height _0  (1.753 m), weight 162 lb (73.5 kg), last menstrual period 06/09/1998, SpO2 100 %.  ECOG PERFORMANCE STATUS: 1 - Symptomatic but completely ambulatory  Physical Exam  Constitutional: Oriented to person, place, and time and well-developed, well-nourished, and in no distress. No distress.  HENT:  Head: Normocephalic and atraumatic.  Mouth/Throat: Oropharynx is clear and moist. No oropharyngeal exudate.  Eyes: Conjunctivae are normal. Right eye exhibits no discharge. Left eye exhibits no discharge. No scleral icterus.  Neck: Normal range of motion. Neck supple.  Cardiovascular: Normal rate, regular rhythm, normal heart sounds and intact distal pulses.   Pulmonary/Chest: Effort normal and breath sounds normal. No respiratory distress. No wheezes. No rales.  Abdominal: Soft. Bowel sounds are normal. Exhibits no distension and no mass. There is no tenderness.  Musculoskeletal: Normal range of motion. Exhibits no edema.  Lymphadenopathy:    No cervical adenopathy.  Neurological: Alert and oriented to person, place, and time.  Exhibits normal muscle tone. Gait normal. Coordination normal.  Skin: Skin is warm and dry. No rash noted. Not diaphoretic. No erythema. No pallor.  Psychiatric: Mood, memory and judgment normal.  Vitals reviewed.  LABORATORY DATA: Lab Results  Component Value Date   WBC 3.7 (L) 04/29/2017   HGB 9.8 (L) 04/29/2017   HCT 29.8 (L) 04/29/2017   MCV 100.7 04/29/2017   PLT 176 04/29/2017      Chemistry      Component Value Date/Time   NA 138 04/29/2017 1034   K 3.8 04/29/2017 1034   CL 107 09/25/2015 0805   CO2 23 04/29/2017 1034   BUN 16.6 04/29/2017 1034   CREATININE 1.0 04/29/2017 1034   GLU 170  01/23/2016      Component Value Date/Time   CALCIUM 9.2 04/29/2017 1034   ALKPHOS 49 04/29/2017 1034   AST 25 04/29/2017 1034   ALT 14 04/29/2017 1034   BILITOT 0.72 04/29/2017 1034       RADIOGRAPHIC STUDIES:  No results found.   ASSESSMENT/PLAN:  Adenocarcinoma of right lung, stage 4 ([facility]) This is a very pleasant 71 year old Hispanic female with metastatic non-small cell lung cancer, adenocarcinoma status post induction systemic chemotherapy with carboplatin and Alimta and she is currently on maintenance treatment with single agent Alimta status post 19 cycles. The patient has been tolerating her treatment fairly well with no significant adverse effects.  Labs were reviewed with the patient and her sister.  The patient's anemia has worsened slightly likely due to her recent radiation.  Patient was instructed to call us if she develops any bleeding or if she becomes more fatigued or short of breath.  We can consider rechecking a CBC on her and transfuse if her hemoglobin drops to less than 8.0.  I recommended for the patient to continue her current treatment with maintenance Alimta and she would proceed with cycle #20 today.  The patient will have a follow-up visit in 3 weeks for evaluation prior to cycle #21 of her maintenance Alimta.    The patient was advised to call immediately if she has any concerning symptoms in the interval. The patient voices understanding of current disease status and treatment options and is in agreement with the current care plan. All questions were answered. The patient knows to call the clinic with any problems, questions or concerns. We can certainly see the patient much sooner if necessary.  No orders of the defined types were placed in this encounter.   Mikey Bussing, DNP, AGPCNP-BC, AOCNP 04/29/17

## 2017-04-29 NOTE — Assessment & Plan Note (Signed)
This is a very pleasant 72 year old Hispanic female with metastatic non-small cell lung cancer, adenocarcinoma status post induction systemic chemotherapy with carboplatin and Alimta and she is currently on maintenance treatment with single agent Alimta status post 19 cycles. The patient has been tolerating her treatment fairly well with no significant adverse effects.  Labs were reviewed with the patient and her sister.  The patient's anemia has worsened slightly likely due to her recent radiation.  Patient was instructed to call us if she develops any bleeding or if she becomes more fatigued or short of breath.  We can consider rechecking a CBC on her and transfuse if her hemoglobin drops to less than 8.0.  I recommended for the patient to continue her current treatment with maintenance Alimta and she would proceed with cycle #20 today.  The patient will have a follow-up visit in 3 weeks for evaluation prior to cycle #21 of her maintenance Alimta.    The patient was advised to call immediately if she has any concerning symptoms in the interval. The patient voices understanding of current disease status and treatment options and is in agreement with the current care plan. All questions were answered. The patient knows to call the clinic with any problems, questions or concerns. We can certainly see the patient much sooner if necessary.

## 2017-04-29 NOTE — Patient Instructions (Signed)
[facility] Discharge Instructions for Patients Receiving Chemotherapy  Today you received the following chemotherapy agents Alimta.   To help prevent nausea and vomiting after your treatment, we encourage you to take your nausea medication as prescribed.   If you develop nausea and vomiting that is not controlled by your nausea medication, call the clinic.   BELOW ARE SYMPTOMS THAT SHOULD BE REPORTED IMMEDIATELY:  *FEVER GREATER THAN 100.5 F  *CHILLS WITH OR WITHOUT FEVER  NAUSEA AND VOMITING THAT IS NOT CONTROLLED WITH YOUR NAUSEA MEDICATION  *UNUSUAL SHORTNESS OF BREATH  *UNUSUAL BRUISING OR BLEEDING  TENDERNESS IN MOUTH AND THROAT WITH OR WITHOUT PRESENCE OF ULCERS  *URINARY PROBLEMS  *BOWEL PROBLEMS  UNUSUAL RASH Items with * indicate a potential emergency and should be followed up as soon as possible.  Feel free to call the clinic should you have any questions or concerns. The clinic phone number is [phone].  Please show the [place] [facility] at check-in to the Emergency Department and triage nurse.

## 2017-05-02 NOTE — Progress Notes (Signed)
  Radiation Oncology         [phone] ________________________________  Name: Valerie Long MRN: [id number]  Date: 04/26/2017  DOB: May 07, 1946  End of Treatment Note  Diagnosis:   72 y.o. female with Stage IV, T1bN2M1b, NSCLC, adenocarcinoma of the right lung with metastatic disease to the lumbar spine and pelvic girdle  Indication for treatment::  Palliative       Radiation treatment dates:   04/14/2017 - 04/26/2017  Site/dose:   The lumbar spine was treated to 30 Gy in 10 fractions of 3 Gy.  Narrative: The patient tolerated radiation treatment relatively well.   She reported mild fatigue towards the end of treatment. Her pain did improve over the course of treatment, but she continued to have persistent tingling in both legs, L>R. She reported mild dysuria but denied any hematuria or diarrhea. Her skin remained intact, and she denied having any skin irritation.  Plan: The patient has completed radiation treatment. The patient will return to radiation oncology clinic for routine followup in one month. I advised the patient to call or return sooner if they have any questions or concerns related to their recovery or treatment. ________________________________  Jodelle Gross, MD, PhD  This document serves as a record of services personally performed by Kyung Rudd, MD. It was created on his behalf by Rae Lips, a trained medical scribe. The creation of this record is based on the scribe's personal observations and the provider's statements to them. This document has been checked and approved by the attending provider.

## 2017-05-19 ENCOUNTER — Ambulatory Visit (HOSPITAL_BASED_OUTPATIENT_CLINIC_OR_DEPARTMENT_OTHER): Payer: Medicare Other

## 2017-05-19 ENCOUNTER — Ambulatory Visit (HOSPITAL_BASED_OUTPATIENT_CLINIC_OR_DEPARTMENT_OTHER): Payer: Medicare Other | Admitting: Internal Medicine

## 2017-05-19 ENCOUNTER — Ambulatory Visit: Payer: Medicare Other

## 2017-05-19 ENCOUNTER — Other Ambulatory Visit (HOSPITAL_BASED_OUTPATIENT_CLINIC_OR_DEPARTMENT_OTHER): Payer: Medicare Other

## 2017-05-19 ENCOUNTER — Telehealth: Payer: Self-pay | Admitting: Internal Medicine

## 2017-05-19 ENCOUNTER — Encounter: Payer: Self-pay | Admitting: Internal Medicine

## 2017-05-19 DIAGNOSIS — Z923 Personal history of irradiation: Secondary | ICD-10-CM

## 2017-05-19 DIAGNOSIS — Z5111 Encounter for antineoplastic chemotherapy: Secondary | ICD-10-CM | POA: Diagnosis not present

## 2017-05-19 DIAGNOSIS — C3491 Malignant neoplasm of unspecified part of right bronchus or lung: Secondary | ICD-10-CM

## 2017-05-19 DIAGNOSIS — G893 Neoplasm related pain (acute) (chronic): Secondary | ICD-10-CM

## 2017-05-19 DIAGNOSIS — C7951 Secondary malignant neoplasm of bone: Secondary | ICD-10-CM

## 2017-05-19 DIAGNOSIS — C3490 Malignant neoplasm of unspecified part of unspecified bronchus or lung: Secondary | ICD-10-CM

## 2017-05-19 DIAGNOSIS — C3431 Malignant neoplasm of lower lobe, right bronchus or lung: Secondary | ICD-10-CM

## 2017-05-19 LAB — CBC WITH DIFFERENTIAL/PLATELET
BASO%: 0 % (ref 0.0–2.0)
Basophils Absolute: 0 10*3/uL (ref 0.0–0.1)
EOS%: 0 % (ref 0.0–7.0)
Eosinophils Absolute: 0 10*3/uL (ref 0.0–0.5)
HCT: 29.7 % — ABNORMAL LOW (ref 34.8–46.6)
HGB: 9.8 g/dL — ABNORMAL LOW (ref 11.6–15.9)
LYMPH%: 9.9 % — ABNORMAL LOW (ref 14.0–49.7)
MCH: 33.1 pg (ref 25.1–34.0)
MCHC: 33 g/dL (ref 31.5–36.0)
MCV: 100.3 fL (ref 79.5–101.0)
MONO#: 0.4 10*3/uL (ref 0.1–0.9)
MONO%: 8.5 % (ref 0.0–14.0)
NEUT#: 3.5 10*3/uL (ref 1.5–6.5)
NEUT%: 81.6 % — ABNORMAL HIGH (ref 38.4–76.8)
Platelets: 218 10*3/uL (ref 145–400)
RBC: 2.96 10*6/uL — ABNORMAL LOW (ref 3.70–5.45)
RDW: 15 % — ABNORMAL HIGH (ref 11.2–14.5)
WBC: 4.3 10*3/uL (ref 3.9–10.3)
lymph#: 0.4 10*3/uL — ABNORMAL LOW (ref 0.9–3.3)

## 2017-05-19 LAB — COMPREHENSIVE METABOLIC PANEL
ALT: 20 U/L (ref 0–55)
AST: 29 U/L (ref 5–34)
Albumin: 3.6 g/dL (ref 3.5–5.0)
Alkaline Phosphatase: 57 U/L (ref 40–150)
Anion Gap: 10 mEq/L (ref 3–11)
BUN: 17.1 mg/dL (ref 7.0–26.0)
CO2: 21 mEq/L — ABNORMAL LOW (ref 22–29)
Calcium: 9.5 mg/dL (ref 8.4–10.4)
Chloride: 106 mEq/L (ref 98–109)
Creatinine: 1 mg/dL (ref 0.6–1.1)
EGFR: 56 mL/min/{1.73_m2} — ABNORMAL LOW (ref >60)
Glucose: 126 mg/dl (ref 70–140)
Potassium: 3.7 mEq/L (ref 3.5–5.1)
Sodium: 137 mEq/L (ref 136–145)
Total Bilirubin: 0.59 mg/dL (ref 0.20–1.20)
Total Protein: 6.9 g/dL (ref 6.4–8.3)

## 2017-05-19 MED ORDER — HEPARIN SOD (PORK) LOCK FLUSH 100 UNIT/ML IV SOLN
500.0000 [IU] | Freq: Once | INTRAVENOUS | Status: AC | PRN
  Administered 2017-05-19: 500 [IU]
  Filled 2017-05-19: qty 5

## 2017-05-19 MED ORDER — PROCHLORPERAZINE MALEATE 10 MG PO TABS
10.0000 mg | ORAL_TABLET | Freq: Once | ORAL | Status: AC
  Administered 2017-05-19: 10 mg via ORAL

## 2017-05-19 MED ORDER — SODIUM CHLORIDE 0.9% FLUSH
10.0000 mL | INTRAVENOUS | Status: DC | PRN
  Administered 2017-05-19: 10 mL
  Filled 2017-05-19: qty 10

## 2017-05-19 MED ORDER — PROCHLORPERAZINE MALEATE 10 MG PO TABS
ORAL_TABLET | ORAL | Status: AC
  Filled 2017-05-19: qty 1

## 2017-05-19 MED ORDER — SODIUM CHLORIDE 0.9 % IV SOLN
525.0000 mg/m2 | Freq: Once | INTRAVENOUS | Status: AC
  Administered 2017-05-19: 1000 mg via INTRAVENOUS
  Filled 2017-05-19: qty 40

## 2017-05-19 MED ORDER — SODIUM CHLORIDE 0.9 % IV SOLN
Freq: Once | INTRAVENOUS | Status: AC
Start: 2017-05-19 — End: 2017-05-19
  Administered 2017-05-19: 09:00:00 via INTRAVENOUS

## 2017-05-19 NOTE — Patient Instructions (Signed)
[facility] Discharge Instructions for Patients Receiving Chemotherapy  Today you received the following chemotherapy agents Alimta  To help prevent nausea and vomiting after your treatment, we encourage you to take your nausea medication as directed   If you develop nausea and vomiting that is not controlled by your nausea medication, call the clinic.   BELOW ARE SYMPTOMS THAT SHOULD BE REPORTED IMMEDIATELY:  *FEVER GREATER THAN 100.5 F  *CHILLS WITH OR WITHOUT FEVER  NAUSEA AND VOMITING THAT IS NOT CONTROLLED WITH YOUR NAUSEA MEDICATION  *UNUSUAL SHORTNESS OF BREATH  *UNUSUAL BRUISING OR BLEEDING  TENDERNESS IN MOUTH AND THROAT WITH OR WITHOUT PRESENCE OF ULCERS  *URINARY PROBLEMS  *BOWEL PROBLEMS  UNUSUAL RASH Items with * indicate a potential emergency and should be followed up as soon as possible.  Feel free to call the clinic should you have any questions or concerns. The clinic phone number is [phone].  Please show the [facility] at check-in to the Emergency Department and triage nurse.

## 2017-05-19 NOTE — Progress Notes (Signed)
[facility] [phone]   Fax:([phone]) [place], MD [address] [place] [address]  DIAGNOSIS: Stage IV (T1b, N2, M1b) non-small cell lung cancer, adenocarcinoma diagnosed in March 2017 and presented with right lower lobe lung nodule in addition to mediastinal lymphadenopathy and metastatic bone lesions.  Molecular studies: PDL 1 TPS  <1%.   Foundation One Studies: Positive for ERBB2 [id number]. Negative for EGFR, KRAS, ALK, BRAF, MET, RET and ROS1.  PRIOR THERAPY:  1) Induction systemic chemotherapy with carboplatin for AUC of 5 and Alimta 500 MG/M2 every 3 weeks is status post 6 cycles at the [facility], last dose was given 03/05/2016 with stable disease. 2) palliative radiation to the metastatic bone disease in the lower back and pelvic area.  CURRENT THERAPY::  1)  Maintenance systemic chemotherapy with Alimta 500 MG/M2 every 3 weeks status post 20 cycles. First dose was given 03/25/2016.  2) Zometa 4 mg IV every 12 week for metastatic bone disease.  INTERVAL HIST Valerie Long 71 y.o. female returns to the clinic today for follow-up visit accompanied by her sister.  The patient is feeling fine today with no specific complaints except for mild chronic back pain.  She completed a course of palliative radiotherapy to the metastatic bone disease.  She denied having any current chest pain, shortness of breath, cough or hemoptysis.  She denied having any nausea, vomiting, diarrhea or constipation.  She denied having any fever or chills.  She is here today for evaluation before starting cycle #21.  MEDICAL HISTORY: Past Medical History:  Diagnosis Date  . Adenocarcinoma of right lung, stage 4 ([place]) 2017  . Adenocarcinoma of right lung, stage 4 ([place])   . Anemia   . Arthritis   . Bone metastases ([place]) 02/24/2017  . Encounter for antineoplastic chemotherapy 03/17/2016  .  Hypercholesterolemia   . Hypertension 06/18/2016  . Osteopenia   . 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 Pneumonia   . Shortness of breath dyspnea     ALLERGIES:  has No Known Allergies.  MEDICATIONS:  Current Outpatient Medications  Medication Sig Dispense Refill  . acetaminophen (TYLENOL) 500 MG tablet Take 1,000 mg by mouth every 6 (six) hours as needed for moderate pain or headache.    . Calcium Carb-Cholecalciferol (CALCIUM 600/VITAMIN D3 PO) Take by mouth.    . Cholecalciferol (VITAMIN D3 PO) Take 1 tablet by mouth 2 (two) times daily.     . Cyanocobalamin (B-12 PO) Take 2 tablets by mouth daily.    Marland Kitchen dexamethasone (DECADRON) 4 MG tablet Take 19m by mouth twice daily the day before, of, and after chemo 40 tablet 1  . Fish Oil-Cholecalciferol (OMEGA-3 + VITAMIN D3 PO) Take 1 tablet by mouth daily.    . folic acid (FOLVITE) 1 MG tablet Take 1 tablet (1 mg total) by mouth daily. 90 tablet 1  . lidocaine-prilocaine (EMLA) cream Apply 1 application topically as needed. Apply 1-2 tsp over port site 1-2 hours prior to chemo. 30 g 0  . ondansetron (ZOFRAN) 8 MG tablet Take 8 mg by mouth every 8 (eight) hours as needed.    .Marland KitchenPRESCRIPTION MEDICATION Inject 1 Dose as directed every 21 ( twenty-one) days. Chemo     No current facility-administered medications for this visit.     SURGICAL HISTORY:  Past Surgical History:  Procedure Laterality Date  .  CESAREAN SECTION     myomectomy  . COLONOSCOPY W/ POLYPECTOMY    . IR GENERIC HISTORICAL  08/17/2016   IR US GUIDE VASC ACCESS RIGHT 08/17/2016 [name], MD [facility]  . IR GENERIC HISTORICAL  08/17/2016   IR FLUORO GUIDE PORT INSERTION RIGHT 08/17/2016 [name], MD [facility]  . MEDIASTINOSCOPY N/A 09/25/2015   Procedure: MEDIASTINOSCOPY;  Surgeon: Melrose Nakayama, MD;  Location: [facility];  Service: Thoracic;  Laterality: N/A;  . VIDEO BRONCHOSCOPY Bilateral 08/19/2015   Procedure: VIDEO BRONCHOSCOPY  WITH FLUORO;  Surgeon: Rigoberto Noel, MD;  Location: [facility];  Service: Cardiopulmonary;  Laterality: Bilateral;  . VIDEO BRONCHOSCOPY N/A 09/08/2015   Procedure: VIDEO BRONCHOSCOPY WITH FLUORO;  Surgeon: Rigoberto Noel, MD;  Location: [facility];  Service: Thoracic;  Laterality: N/A;  . VIDEO BRONCHOSCOPY WITH ENDOBRONCHIAL ULTRASOUND Right 09/08/2015   Procedure: ATTEMPTED VIDEO BRONCHOSCOPY ENDOBRONCHIAL ULTRASOUND  ;  Surgeon: Rigoberto Noel, MD;  Location: [facility];  Service: Thoracic;  Laterality: Right;    REVIEW OF SYSTEMS:  A comprehensive review of systems was negative except for: Constitutional: positive for fatigue Musculoskeletal: positive for back pain   PHYSICAL EXAMINATION: General appearance: alert, cooperative, fatigued and no distress Head: Normocephalic, without obvious abnormality, atraumatic Neck: no adenopathy, no JVD, supple, symmetrical, trachea midline and thyroid not enlarged, symmetric, no tenderness/mass/nodules Lymph nodes: Cervical, supraclavicular, and axillary nodes normal. Resp: clear to auscultation bilaterally Back: symmetric, no curvature. ROM normal. No CVA tenderness. Cardio: regular rate and rhythm, S1, S2 normal, no murmur, click, rub or gallop GI: soft, non-tender; bowel sounds normal; no masses,  no organomegaly Extremities: extremities normal, atraumatic, no cyanosis or edema  ECOG PERFORMANCE STATUS: 1 - Symptomatic but completely ambulatory  Blood pressure 126/69, pulse 90, temperature 98.5 F (36.9 C), temperature source Oral, resp. rate 20, weight 160 lb 6.4 oz (72.8 kg), last menstrual period 06/09/1998, SpO2 100 %.  LABORATORY DATA: Lab Results  Component Value Date   WBC 4.3 05/19/2017   HGB 9.8 (L) 05/19/2017   HCT 29.7 (L) 05/19/2017   MCV 100.3 05/19/2017   PLT 218 05/19/2017      Chemistry      Component Value Date/Time   NA 138 04/29/2017 1034   K 3.8 04/29/2017 1034   CL 107 09/25/2015 0805   CO2 23 04/29/2017 1034   BUN 16.6  04/29/2017 1034   CREATININE 1.0 04/29/2017 1034   GLU 170 01/23/2016      Component Value Date/Time   CALCIUM 9.2 04/29/2017 1034   ALKPHOS 49 04/29/2017 1034   AST 25 04/29/2017 1034   ALT 14 04/29/2017 1034   BILITOT 0.72 04/29/2017 1034       RADIOGRAPHIC STUDIES: No results found.  ASSESSMENT AND PLAN:  This is a very pleasant 71 years old Hispanic female with metastatic non-small cell lung cancer, adenocarcinoma status post induction systemic chemotherapy with carboplatin and Alimta and she is currently on maintenance treatment with single agent Alimta status post 20 cycles. The patient tolerated the last cycle of her treatment well with no significant adverse effects. I recommended for her to proceed with cycle #21 today. I will see her back for follow-up visit in 3 weeks for evaluation before starting cycle #22 after repeating CT scan of the chest, abdomen and pelvis for restaging of her disease. She was advised to call immediately if she has any concerning symptoms in the interval. The patient voices understanding of current disease status and treatment options  and is in agreement with the current care plan. All questions were answered. The patient knows to call the clinic with any problems, questions or concerns. We can certainly see the patient much sooner if necessary.  Disclaimer: This note was dictated with voice recognition software. Similar sounding words can inadvertently be transcribed and may not be corrected upon review.

## 2017-05-19 NOTE — Telephone Encounter (Signed)
Gave avs and calendar for January 2019 °

## 2017-05-30 ENCOUNTER — Encounter: Payer: Self-pay | Admitting: Radiation Oncology

## 2017-05-30 ENCOUNTER — Ambulatory Visit
Admission: RE | Admit: 2017-05-30 | Discharge: 2017-05-30 | Disposition: A | Discharge status: Home / Self Care | Payer: Medicare Other | Source: Ambulatory Visit | Attending: Radiation Oncology | Admitting: Radiation Oncology

## 2017-05-30 ENCOUNTER — Other Ambulatory Visit: Payer: Self-pay

## 2017-05-30 VITALS — BP 124/78 | HR 102 | Temp 98.3°F | Resp 18 | Ht 69.0 in | Wt 157.6 lb

## 2017-05-30 DIAGNOSIS — E7800 Pure hypercholesterolemia, unspecified: Secondary | ICD-10-CM | POA: Diagnosis not present

## 2017-05-30 DIAGNOSIS — C7801 Secondary malignant neoplasm of right lung: Secondary | ICD-10-CM | POA: Diagnosis not present

## 2017-05-30 DIAGNOSIS — Z510 Encounter for antineoplastic radiation therapy: Secondary | ICD-10-CM | POA: Diagnosis not present

## 2017-05-30 DIAGNOSIS — C3491 Malignant neoplasm of unspecified part of right bronchus or lung: Secondary | ICD-10-CM | POA: Diagnosis not present

## 2017-05-30 DIAGNOSIS — I10 Essential (primary) hypertension: Secondary | ICD-10-CM | POA: Diagnosis not present

## 2017-05-30 DIAGNOSIS — C7951 Secondary malignant neoplasm of bone: Secondary | ICD-10-CM | POA: Diagnosis not present

## 2017-05-30 NOTE — Progress Notes (Signed)
Radiation Oncology         [phone] ________________________________  Name: Valerie Long MRN: [id number]  Date of Service: 05/30/2017 DOB: 1945-06-18  Post Treatment Note  CC: Colon Branch, MD  Curt Bears, MD  Diagnosis:   72 y.o. female with Stage IV, [phone], NSCLC, adenocarcinoma of the right lungwith metastatic disease to the lumbar spine and pelvic girdle   Interval Since Last Radiation:  5 weeks   04/14/2017 - 04/26/2017: The lumbar spine was treated to 30 Gy in 10 fractions of 3 Gy.    Narrative:  The patient returns today for routine follow-up. She tolerated radiation without difficulty. She continues on Alimta with Dr. Julien Nordmann and is due next week for her next infusion following a restaging scan.                             On review of systems, the patient states she continues to have pain particularly along the right hip and into her low back. She reports this is much more tolerable than it had been prior to treatment, but not resolved. No other complaints are noted.  ALLERGIES:  has No Known Allergies.  Meds: Current Outpatient Medications  Medication Sig Dispense Refill  . acetaminophen (TYLENOL) 500 MG tablet Take 1,000 mg by mouth every 6 (six) hours as needed for moderate pain or headache.    . Calcium Carb-Cholecalciferol (CALCIUM 600/VITAMIN D3 PO) Take by mouth.    . Cholecalciferol (VITAMIN D3 PO) Take 1 tablet by mouth 2 (two) times daily.     . Cyanocobalamin (B-12 PO) Take 2 tablets by mouth daily.    Marland Kitchen dexamethasone (DECADRON) 4 MG tablet Take 4mg  by mouth twice daily the day before, of, and after chemo 40 tablet 1  . Fish Oil-Cholecalciferol (OMEGA-3 + VITAMIN D3 PO) Take 1 tablet by mouth daily.    . folic acid (FOLVITE) 1 MG tablet Take 1 tablet (1 mg total) by mouth daily. 90 tablet 1  . lidocaine-prilocaine (EMLA) cream Apply 1 application topically as needed. Apply 1-2 tsp over port site 1-2 hours prior to chemo. 30 g 0  . ondansetron  (ZOFRAN) 8 MG tablet Take 8 mg by mouth every 8 (eight) hours as needed.    Marland Kitchen PRESCRIPTION MEDICATION Inject 1 Dose as directed every 21 ( twenty-one) days. Chemo     No current facility-administered medications for this encounter.     Physical Findings:  height is 5\' 9"  (1.753 m) and weight is 157 lb 9.6 oz (71.5 kg). Her oral temperature is 98.3 F (36.8 C). Her blood pressure is 124/78 and her pulse is 102 (abnormal). Her respiration is 18 and oxygen saturation is 98%.  Pain Assessment Pain Score: 4  Pain Loc: Buttocks(Bilateral buttock,low spine)/10 In general this is a well appearing Hispanic female in no acute distress. She's alert and oriented x4 and appropriate throughout the examination. Cardiopulmonary assessment is negative for acute distress and she exhibits normal effort.   Lab Findings: Lab Results  Component Value Date   WBC 4.3 05/19/2017   HGB 9.8 (L) 05/19/2017   HCT 29.7 (L) 05/19/2017   MCV 100.3 05/19/2017   PLT 218 05/19/2017     Radiographic Findings: No results found.  Impression/Plan: 1. 71 y.o. female with Stage IV, T1bN2M1b, NSCLC, adenocarcinoma of the right lungwith metastatic disease to the lumbar spine and pelvic girdle. The patient is recovering from the effects of radiotherapy. She continues  to have some pain though this is much improved. She is offered a prescription for pain medication but declines. She will keep [place] informed if she changes her mind.        Carola Rhine, PAC

## 2017-06-02 ENCOUNTER — Telehealth: Payer: Self-pay | Admitting: *Deleted

## 2017-06-02 NOTE — Telephone Encounter (Signed)
Oncology Nurse Navigator Documentation  Oncology Nurse Navigator Flowsheets 06/02/2017  Navigator Location [facility]  Navigator Encounter Type Telephone/I followed up on Valerie Long schedule. Patient needs CT C/A/P. I called patient to update her. I left vm message to call with my name and phone number.   Telephone Outgoing Call  Treatment Phase Treatment  Barriers/Navigation Needs Coordination of Care  Interventions Coordination of Care  Coordination of Care Other  Acuity Level 1  Time Spent with Patient 30

## 2017-06-08 ENCOUNTER — Ambulatory Visit (HOSPITAL_COMMUNITY)
Admission: RE | Admit: 2017-06-08 | Discharge: 2017-06-08 | Disposition: A | Discharge status: Home / Self Care | Payer: Medicare Other | Source: Ambulatory Visit | Attending: Internal Medicine | Admitting: Internal Medicine

## 2017-06-08 ENCOUNTER — Telehealth: Payer: Self-pay | Admitting: Medical Oncology

## 2017-06-08 ENCOUNTER — Encounter (HOSPITAL_COMMUNITY): Payer: Self-pay

## 2017-06-08 DIAGNOSIS — K8020 Calculus of gallbladder without cholecystitis without obstruction: Secondary | ICD-10-CM | POA: Insufficient documentation

## 2017-06-08 DIAGNOSIS — C7951 Secondary malignant neoplasm of bone: Secondary | ICD-10-CM | POA: Diagnosis not present

## 2017-06-08 DIAGNOSIS — Q433 Congenital malformations of intestinal fixation: Secondary | ICD-10-CM | POA: Insufficient documentation

## 2017-06-08 DIAGNOSIS — C3490 Malignant neoplasm of unspecified part of unspecified bronchus or lung: Secondary | ICD-10-CM

## 2017-06-08 DIAGNOSIS — C3431 Malignant neoplasm of lower lobe, right bronchus or lung: Secondary | ICD-10-CM | POA: Diagnosis not present

## 2017-06-08 MED ORDER — IOPAMIDOL (ISOVUE-300) INJECTION 61%
INTRAVENOUS | Status: AC
  Filled 2017-06-08: qty 100

## 2017-06-08 MED ORDER — IOPAMIDOL (ISOVUE-300) INJECTION 61%
100.0000 mL | Freq: Once | INTRAVENOUS | Status: AC | PRN
  Administered 2017-06-08: 100 mL via INTRAVENOUS

## 2017-06-08 NOTE — Telephone Encounter (Signed)
Pt reports she does not have scan appt and is scheduled to see [facility] tomorrow. I called CS to get appt. Manuela Schwartz will call pt.

## 2017-06-09 ENCOUNTER — Ambulatory Visit (HOSPITAL_BASED_OUTPATIENT_CLINIC_OR_DEPARTMENT_OTHER): Payer: Medicare Other

## 2017-06-09 ENCOUNTER — Other Ambulatory Visit (HOSPITAL_BASED_OUTPATIENT_CLINIC_OR_DEPARTMENT_OTHER): Payer: Medicare Other

## 2017-06-09 ENCOUNTER — Ambulatory Visit: Payer: Medicare Other

## 2017-06-09 ENCOUNTER — Encounter: Payer: Self-pay | Admitting: Internal Medicine

## 2017-06-09 ENCOUNTER — Ambulatory Visit (HOSPITAL_BASED_OUTPATIENT_CLINIC_OR_DEPARTMENT_OTHER): Payer: Medicare Other | Admitting: Internal Medicine

## 2017-06-09 VITALS — BP 143/81 | HR 101 | Temp 97.6°F | Resp 18 | Ht 69.0 in | Wt 158.6 lb

## 2017-06-09 DIAGNOSIS — G893 Neoplasm related pain (acute) (chronic): Secondary | ICD-10-CM | POA: Diagnosis not present

## 2017-06-09 DIAGNOSIS — C3491 Malignant neoplasm of unspecified part of right bronchus or lung: Secondary | ICD-10-CM

## 2017-06-09 DIAGNOSIS — Z5111 Encounter for antineoplastic chemotherapy: Secondary | ICD-10-CM

## 2017-06-09 DIAGNOSIS — C7951 Secondary malignant neoplasm of bone: Secondary | ICD-10-CM

## 2017-06-09 DIAGNOSIS — C3431 Malignant neoplasm of lower lobe, right bronchus or lung: Secondary | ICD-10-CM | POA: Diagnosis not present

## 2017-06-09 DIAGNOSIS — I10 Essential (primary) hypertension: Secondary | ICD-10-CM

## 2017-06-09 LAB — CBC WITH DIFFERENTIAL/PLATELET
BASO%: 0.4 % (ref 0.0–2.0)
Basophils Absolute: 0 10*3/uL (ref 0.0–0.1)
EOS%: 0 % (ref 0.0–7.0)
Eosinophils Absolute: 0 10*3/uL (ref 0.0–0.5)
HCT: 32.1 % — ABNORMAL LOW (ref 34.8–46.6)
HGB: 10.8 g/dL — ABNORMAL LOW (ref 11.6–15.9)
LYMPH%: 9.1 % — ABNORMAL LOW (ref 14.0–49.7)
MCH: 33.6 pg (ref 25.1–34.0)
MCHC: 33.8 g/dL (ref 31.5–36.0)
MCV: 99.3 fL (ref 79.5–101.0)
MONO#: 0.1 10*3/uL (ref 0.1–0.9)
MONO%: 3.7 % (ref 0.0–14.0)
NEUT#: 2.5 10*3/uL (ref 1.5–6.5)
NEUT%: 86.8 % — ABNORMAL HIGH (ref 38.4–76.8)
Platelets: 250 10*3/uL (ref 145–400)
RBC: 3.23 10*6/uL — ABNORMAL LOW (ref 3.70–5.45)
RDW: 15.5 % — ABNORMAL HIGH (ref 11.2–14.5)
WBC: 2.8 10*3/uL — ABNORMAL LOW (ref 3.9–10.3)
lymph#: 0.3 10*3/uL — ABNORMAL LOW (ref 0.9–3.3)

## 2017-06-09 LAB — COMPREHENSIVE METABOLIC PANEL
ALT: 23 U/L (ref 0–55)
AST: 35 U/L — ABNORMAL HIGH (ref 5–34)
Albumin: 3.7 g/dL (ref 3.5–5.0)
Alkaline Phosphatase: 63 U/L (ref 40–150)
Anion Gap: 9 mEq/L (ref 3–11)
BUN: 19 mg/dL (ref 7.0–26.0)
CO2: 25 mEq/L (ref 22–29)
Calcium: 9.9 mg/dL (ref 8.4–10.4)
Chloride: 103 mEq/L (ref 98–109)
Creatinine: 1.2 mg/dL — ABNORMAL HIGH (ref 0.6–1.1)
EGFR: 48 mL/min/{1.73_m2} — ABNORMAL LOW (ref >60)
Glucose: 144 mg/dl — ABNORMAL HIGH (ref 70–140)
Potassium: 3.9 mEq/L (ref 3.5–5.1)
Sodium: 137 mEq/L (ref 136–145)
Total Bilirubin: 0.65 mg/dL (ref 0.20–1.20)
Total Protein: 7.2 g/dL (ref 6.4–8.3)

## 2017-06-09 MED ORDER — SODIUM CHLORIDE 0.9% FLUSH
3.0000 mL | Freq: Once | INTRAVENOUS | Status: AC | PRN
  Administered 2017-06-09: 10:00:00 via INTRAVENOUS
  Filled 2017-06-09: qty 10

## 2017-06-09 MED ORDER — PROCHLORPERAZINE MALEATE 10 MG PO TABS
ORAL_TABLET | ORAL | Status: AC
  Filled 2017-06-09: qty 1

## 2017-06-09 MED ORDER — SODIUM CHLORIDE 0.9 % IV SOLN
1000.0000 mg | Freq: Once | INTRAVENOUS | Status: AC
  Administered 2017-06-09: 1000 mg via INTRAVENOUS
  Filled 2017-06-09: qty 40

## 2017-06-09 MED ORDER — CYANOCOBALAMIN 1000 MCG/ML IJ SOLN
INTRAMUSCULAR | Status: AC
  Filled 2017-06-09: qty 1

## 2017-06-09 MED ORDER — SODIUM CHLORIDE 0.9% FLUSH
10.0000 mL | INTRAVENOUS | Status: DC | PRN
  Administered 2017-06-09: 10 mL
  Filled 2017-06-09: qty 10

## 2017-06-09 MED ORDER — HEPARIN SOD (PORK) LOCK FLUSH 100 UNIT/ML IV SOLN
500.0000 [IU] | Freq: Once | INTRAVENOUS | Status: AC | PRN
  Administered 2017-06-09: 500 [IU]
  Filled 2017-06-09: qty 5

## 2017-06-09 MED ORDER — CYANOCOBALAMIN 1000 MCG/ML IJ SOLN
1000.0000 ug | Freq: Once | INTRAMUSCULAR | Status: AC
  Administered 2017-06-09: 1000 ug via INTRAMUSCULAR

## 2017-06-09 MED ORDER — PROCHLORPERAZINE MALEATE 10 MG PO TABS
10.0000 mg | ORAL_TABLET | Freq: Once | ORAL | Status: AC
  Administered 2017-06-09: 10 mg via ORAL

## 2017-06-09 MED ORDER — SODIUM CHLORIDE 0.9 % IV SOLN
Freq: Once | INTRAVENOUS | Status: AC
  Administered 2017-06-09: 11:00:00 via INTRAVENOUS

## 2017-06-09 NOTE — Progress Notes (Signed)
[name]:([phone]) [phone]   Fax:(336) [place], MD [address] [place] [address]  DIAGNOSIS: Stage IV (T1b, N2, M1b) non-small cell lung cancer, adenocarcinoma diagnosed in March 2017 and presented with right lower lobe lung nodule in addition to mediastinal lymphadenopathy and metastatic bone lesions.  Molecular studies: PDL 1 TPS  <1%.   Foundation One Studies: Positive for ERBB2 [id number]. Negative for EGFR, KRAS, ALK, BRAF, MET, RET and ROS1.  PRIOR THERAPY:  1) Induction systemic chemotherapy with carboplatin for AUC of 5 and Alimta 500 MG/M2 every 3 weeks is status post 6 cycles at the [facility], last dose was given 03/05/2016 with stable disease. 2) palliative radiation to the metastatic bone disease in the lower back and pelvic area.  CURRENT THERAPY::  1)  Maintenance systemic chemotherapy with Alimta 500 MG/M2 every 3 weeks status post 21 cycles. First dose was given 03/25/2016.  2) Zometa 4 mg IV every 12 week for metastatic bone disease.  INTERVAL HIST Valerie Long 72 y.o. female returns to the clinic today for follow-up visit accompanied by her sister.  The patient is feeling fine today with no specific complaints except for the persistent low back pain.  She currently takes Tylenol on as-needed basis.  She denied having any current chest pain, shortness breath, cough or hemoptysis.  She denied having any weight loss or night sweats.  She has no nausea, vomiting, diarrhea or constipation.  She continues to tolerate her treatment with maintenance Alimta fairly well.  She had repeat CT scan of the chest, abdomen and pelvis performed recently and she is here for evaluation and discussion of her scan results.  MEDICAL HISTORY: Past Medical History:  Diagnosis Date  . Adenocarcinoma of right lung, stage 4 ([place]) 2017  . Adenocarcinoma of right lung, stage 4 ([place])   . Anemia    . Arthritis   . Bone metastases ([place]) 02/24/2017  . Encounter for antineoplastic chemotherapy 03/17/2016  . Hypercholesterolemia   . Hypertension 06/18/2016  . Osteopenia   . 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 Pneumonia   . Shortness of breath dyspnea     ALLERGIES:  has No Known Allergies.  MEDICATIONS:  Current Outpatient Medications  Medication Sig Dispense Refill  . acetaminophen (TYLENOL) 500 MG tablet Take 1,000 mg by mouth every 6 (six) hours as needed for moderate pain or headache.    . Calcium Carb-Cholecalciferol (CALCIUM 600/VITAMIN D3 PO) Take by mouth.    . Cholecalciferol (VITAMIN D3 PO) Take 1 tablet by mouth 2 (two) times daily.     . Cyanocobalamin (B-12 PO) Take 2 tablets by mouth daily.    Marland Kitchen dexamethasone (DECADRON) 4 MG tablet Take 69m by mouth twice daily the day before, of, and after chemo 40 tablet 1  . Fish Oil-Cholecalciferol (OMEGA-3 + VITAMIN D3 PO) Take 1 tablet by mouth daily.    . folic acid (FOLVITE) 1 MG tablet Take 1 tablet (1 mg total) by mouth daily. 90 tablet 1  . lidocaine-prilocaine (EMLA) cream Apply 1 application topically as needed. Apply 1-2 tsp over port site 1-2 hours prior to chemo. 30 g 0  . ondansetron (ZOFRAN) 8 MG tablet Take 8 mg by mouth every 8 (eight) hours as needed.    .Marland KitchenPRESCRIPTION MEDICATION Inject 1 Dose as directed every 21 ( twenty-one) days. Chemo  No current facility-administered medications for this visit.     SURGICAL HISTORY:  Past Surgical History:  Procedure Laterality Date  . CESAREAN SECTION     myomectomy  . COLONOSCOPY W/ POLYPECTOMY    . IR GENERIC HISTORICAL  08/17/2016   IR US GUIDE VASC ACCESS RIGHT 08/17/2016 [name], MD [facility]  . IR GENERIC HISTORICAL  08/17/2016   IR FLUORO GUIDE PORT INSERTION RIGHT 08/17/2016 [name], MD [facility]  . MEDIASTINOSCOPY N/A 09/25/2015   Procedure: MEDIASTINOSCOPY;  Surgeon: Melrose Nakayama, MD;  Location: [facility];   Service: Thoracic;  Laterality: N/A;  . VIDEO BRONCHOSCOPY Bilateral 08/19/2015   Procedure: VIDEO BRONCHOSCOPY WITH FLUORO;  Surgeon: Rigoberto Noel, MD;  Location: [facility];  Service: Cardiopulmonary;  Laterality: Bilateral;  . VIDEO BRONCHOSCOPY N/A 09/08/2015   Procedure: VIDEO BRONCHOSCOPY WITH FLUORO;  Surgeon: Rigoberto Noel, MD;  Location: [facility];  Service: Thoracic;  Laterality: N/A;  . VIDEO BRONCHOSCOPY WITH ENDOBRONCHIAL ULTRASOUND Right 09/08/2015   Procedure: ATTEMPTED VIDEO BRONCHOSCOPY ENDOBRONCHIAL ULTRASOUND  ;  Surgeon: Rigoberto Noel, MD;  Location: [facility];  Service: Thoracic;  Laterality: Right;    REVIEW OF SYSTEMS:  Constitutional: negative Eyes: negative Ears, nose, mouth, throat, and face: negative Respiratory: negative Cardiovascular: negative Gastrointestinal: negative Genitourinary:negative Integument/breast: negative Hematologic/lymphatic: negative Musculoskeletal:positive for back pain Neurological: negative Behavioral/Psych: negative Endocrine: negative Allergic/Immunologic: negative   PHYSICAL EXAMINATION: General appearance: alert, cooperative and no distress Head: Normocephalic, without obvious abnormality, atraumatic Neck: no adenopathy, no JVD, supple, symmetrical, trachea midline and thyroid not enlarged, symmetric, no tenderness/mass/nodules Lymph nodes: Cervical, supraclavicular, and axillary nodes normal. Resp: clear to auscultation bilaterally Back: symmetric, no curvature. ROM normal. No CVA tenderness. Cardio: regular rate and rhythm, S1, S2 normal, no murmur, click, rub or gallop GI: soft, non-tender; bowel sounds normal; no masses,  no organomegaly Extremities: extremities normal, atraumatic, no cyanosis or edema Neurologic: Alert and oriented X 3, normal strength and tone. Normal symmetric reflexes. Normal coordination and gait  ECOG PERFORMANCE STATUS: 1 - Symptomatic but completely ambulatory  Blood pressure (!) 143/81, pulse (!) 101,  temperature 97.6 F (36.4 C), temperature source Oral, resp. rate 18, height _0  (1.753 m), weight 158 lb 9.6 oz (71.9 kg), last menstrual period 06/09/1998, SpO2 100 %.  LABORATORY DATA: Lab Results  Component Value Date   WBC 2.8 (L) 06/09/2017   HGB 10.8 (L) 06/09/2017   HCT 32.1 (L) 06/09/2017   MCV 99.3 06/09/2017   PLT 250 06/09/2017      Chemistry      Component Value Date/Time   NA 137 06/09/2017 0901   K 3.9 06/09/2017 0901   CL 107 09/25/2015 0805   CO2 25 06/09/2017 0901   BUN 19.0 06/09/2017 0901   CREATININE 1.2 (H) 06/09/2017 0901   GLU 170 01/23/2016      Component Value Date/Time   CALCIUM 9.9 06/09/2017 0901   ALKPHOS 63 06/09/2017 0901   AST 35 (H) 06/09/2017 0901   ALT 23 06/09/2017 0901   BILITOT 0.65 06/09/2017 0901       RADIOGRAPHIC STUDIES: Ct Chest W Contrast  Result Date: 06/08/2017 CLINICAL DATA:  Lung cancer, non-small-cell. Adenocarcinoma diagnosed 3/17 in the right lower lobe. Bone and nodal metastasis. Chemotherapy ongoing. Radiation therapy completed in November. EXAM: CT CHEST, ABDOMEN, AND PELVIS WITH CONTRAST TECHNIQUE: Multidetector CT imaging of the chest, abdomen and pelvis was performed following the standard protocol during bolus administration of intravenous contrast. CONTRAST:  12m ISOVUE-300  IOPAMIDOL (ISOVUE-300) INJECTION 61% COMPARISON:  03/29/2017 FINDINGS: CT CHEST FINDINGS Cardiovascular: Aortic atherosclerosis. Tortuous thoracic aorta. Mild cardiomegaly, accentuated by a pectus excavatum deformity. No pericardial effusion. No central pulmonary embolism, on this non-dedicated study. Right Port-A-Cath which terminates at the low SVC. Mediastinum/Nodes: No supraclavicular adenopathy. No mediastinal or hilar adenopathy. Lungs/Pleura: No pleural fluid. Right lower lobe pulmonary nodule measures 2.0 x 1.3 cm on image 81/series 5 versus 2.4 x 1.9 cm on the prior exam (when remeasured). 1.6 cm craniocaudal today on coronal image 60  versus similar to on the prior (when remeasured). Adjacent more anterior right lower lobe volume loss is similar. Smaller bilateral pulmonary nodules are again identified. Index right upper lobe 6 mm nodule on image 44/series 5, similar. A left upper lobe 5 mm nodule on image 66/series 5 is also not significantly changed. Musculoskeletal: Similar distribution and size of multifocal sclerotic osseous metastasis. CT ABDOMEN PELVIS FINDINGS Hepatobiliary: A too small to characterize left hepatic lobe lesion is unchanged. Multiple gallstones on the order of 4 mm, without acute cholecystitis or biliary duct dilatation. Pancreas: Normal, without mass or ductal dilatation. Spleen: Normal in size, without focal abnormality. Adrenals/Urinary Tract: Normal adrenal glands. Pelvic left kidney. Normal right kidney, without hydronephrosis. The bladder is thick walled, including on image 99/series 2. Stomach/Bowel: Normal stomach, without wall thickening. Small bowel malrotation, with the colon all positioned in the left side of the abdomen and small bowel Positioned in the right sided abdomen. Ileocecal valve in the central pelvis, with the appendix positioned within the right central abdomen.Normal small bowel caliber. Vascular/Lymphatic: Aortic atherosclerosis. No abdominopelvic adenopathy. Reproductive: Normal uterus and adnexa. Other: No significant free fluid. No evidence of omental or peritoneal disease. Musculoskeletal: Multifocal sclerotic metastasis are similar in size and distribution. Convex left thoracolumbar spine curvature. IMPRESSION: CT CHEST IMPRESSION 1. Decreased size of right lower lobe primary. Pulmonary and osseous metastasis are similar. 2. No new or progressive disease within the chest. CT ABDOMEN AND PELVIS IMPRESSION 1. No soft tissue metastasis within the abdomen or pelvis. Similar osseous metastasis. 2. Cholelithiasis. 3. Bladder wall thickening. Consider correlation with urinalysis to exclude  cystitis. 4. Small bowel malrotation. Electronically Signed   By: Abigail Miyamoto M.D.   On: 06/08/2017 14:45   Ct Abdomen Pelvis W Contrast  Result Date: 06/08/2017 CLINICAL DATA:  Lung cancer, non-small-cell. Adenocarcinoma diagnosed 3/17 in the right lower lobe. Bone and nodal metastasis. Chemotherapy ongoing. Radiation therapy completed in November. EXAM: CT CHEST, ABDOMEN, AND PELVIS WITH CONTRAST TECHNIQUE: Multidetector CT imaging of the chest, abdomen and pelvis was performed following the standard protocol during bolus administration of intravenous contrast. CONTRAST:  156m ISOVUE-300 IOPAMIDOL (ISOVUE-300) INJECTION 61% COMPARISON:  03/29/2017 FINDINGS: CT CHEST FINDINGS Cardiovascular: Aortic atherosclerosis. Tortuous thoracic aorta. Mild cardiomegaly, accentuated by a pectus excavatum deformity. No pericardial effusion. No central pulmonary embolism, on this non-dedicated study. Right Port-A-Cath which terminates at the low SVC. Mediastinum/Nodes: No supraclavicular adenopathy. No mediastinal or hilar adenopathy. Lungs/Pleura: No pleural fluid. Right lower lobe pulmonary nodule measures 2.0 x 1.3 cm on image 81/series 5 versus 2.4 x 1.9 cm on the prior exam (when remeasured). 1.6 cm craniocaudal today on coronal image 60 versus similar to on the prior (when remeasured). Adjacent more anterior right lower lobe volume loss is similar. Smaller bilateral pulmonary nodules are again identified. Index right upper lobe 6 mm nodule on image 44/series 5, similar. A left upper lobe 5 mm nodule on image 66/series 5 is also not significantly changed. Musculoskeletal: Similar  distribution and size of multifocal sclerotic osseous metastasis. CT ABDOMEN PELVIS FINDINGS Hepatobiliary: A too small to characterize left hepatic lobe lesion is unchanged. Multiple gallstones on the order of 4 mm, without acute cholecystitis or biliary duct dilatation. Pancreas: Normal, without mass or ductal dilatation. Spleen: Normal in  size, without focal abnormality. Adrenals/Urinary Tract: Normal adrenal glands. Pelvic left kidney. Normal right kidney, without hydronephrosis. The bladder is thick walled, including on image 99/series 2. Stomach/Bowel: Normal stomach, without wall thickening. Small bowel malrotation, with the colon all positioned in the left side of the abdomen and small bowel Positioned in the right sided abdomen. Ileocecal valve in the central pelvis, with the appendix positioned within the right central abdomen.Normal small bowel caliber. Vascular/Lymphatic: Aortic atherosclerosis. No abdominopelvic adenopathy. Reproductive: Normal uterus and adnexa. Other: No significant free fluid. No evidence of omental or peritoneal disease. Musculoskeletal: Multifocal sclerotic metastasis are similar in size and distribution. Convex left thoracolumbar spine curvature. IMPRESSION: CT CHEST IMPRESSION 1. Decreased size of right lower lobe primary. Pulmonary and osseous metastasis are similar. 2. No new or progressive disease within the chest. CT ABDOMEN AND PELVIS IMPRESSION 1. No soft tissue metastasis within the abdomen or pelvis. Similar osseous metastasis. 2. Cholelithiasis. 3. Bladder wall thickening. Consider correlation with urinalysis to exclude cystitis. 4. Small bowel malrotation. Electronically Signed   By: Abigail Miyamoto M.D.   On: 06/08/2017 14:45    ASSESSMENT AND PLAN:  This is a very pleasant 72 years old Hispanic female with metastatic non-small cell lung cancer, adenocarcinoma status post induction systemic chemotherapy with carboplatin and Alimta and she is currently on maintenance treatment with single agent Alimta status post 21 cycles. The patient continues to tolerate her maintenance treatment fairly well with no concerning complaints. She had repeat CT scan of the chest, abdomen and pelvis performed recently that showed decrease in the size of the right lower lobe primary tumor with no other significant evidence  for disease progression. I discussed the scan results with the patient and her sister today.  I recommended for her to continue her current maintenance treatment with Alimta. The patient also received a call from [facility] regarding eligibility for a clinical trial there and she would like to go on visit for discussion. I encouraged the patient to go to this meeting and to see if there is any other better options available. She will come back for follow-up visit in 3 weeks for evaluation before the next cycle of her treatment. The patient was advised to call immediately if she has any concerning symptoms in the interval. The patient voices understanding of current disease status and treatment options and is in agreement with the current care plan. All questions were answered. The patient knows to call the clinic with any problems, questions or concerns. We can certainly see the patient much sooner if necessary.  Disclaimer: This note was dictated with voice recognition software. Similar sounding words can inadvertently be transcribed and may not be corrected upon review.

## 2017-06-29 DIAGNOSIS — H04123 Dry eye syndrome of bilateral lacrimal glands: Secondary | ICD-10-CM | POA: Diagnosis not present

## 2017-06-29 DIAGNOSIS — H25812 Combined forms of age-related cataract, left eye: Secondary | ICD-10-CM | POA: Diagnosis not present

## 2017-06-29 DIAGNOSIS — H25813 Combined forms of age-related cataract, bilateral: Secondary | ICD-10-CM | POA: Diagnosis not present

## 2017-06-30 ENCOUNTER — Inpatient Hospital Stay: Payer: Medicare Other

## 2017-06-30 ENCOUNTER — Telehealth: Payer: Self-pay | Admitting: Internal Medicine

## 2017-06-30 ENCOUNTER — Inpatient Hospital Stay: Payer: Medicare Other | Attending: Internal Medicine

## 2017-06-30 ENCOUNTER — Encounter: Payer: Self-pay | Admitting: Oncology

## 2017-06-30 ENCOUNTER — Inpatient Hospital Stay (HOSPITAL_BASED_OUTPATIENT_CLINIC_OR_DEPARTMENT_OTHER): Payer: Medicare Other | Admitting: Oncology

## 2017-06-30 VITALS — BP 157/90 | HR 80 | Temp 97.8°F | Resp 18 | Ht 69.0 in | Wt 158.3 lb

## 2017-06-30 DIAGNOSIS — C3491 Malignant neoplasm of unspecified part of right bronchus or lung: Secondary | ICD-10-CM

## 2017-06-30 DIAGNOSIS — M545 Low back pain: Secondary | ICD-10-CM

## 2017-06-30 DIAGNOSIS — Z5112 Encounter for antineoplastic immunotherapy: Secondary | ICD-10-CM | POA: Insufficient documentation

## 2017-06-30 DIAGNOSIS — C3431 Malignant neoplasm of lower lobe, right bronchus or lung: Secondary | ICD-10-CM | POA: Insufficient documentation

## 2017-06-30 DIAGNOSIS — C7951 Secondary malignant neoplasm of bone: Secondary | ICD-10-CM

## 2017-06-30 DIAGNOSIS — Z5111 Encounter for antineoplastic chemotherapy: Secondary | ICD-10-CM

## 2017-06-30 LAB — CBC WITH DIFFERENTIAL/PLATELET
Basophils Absolute: 0 10*3/uL (ref 0.0–0.1)
Basophils Relative: 0 %
Eosinophils Absolute: 0 10*3/uL (ref 0.0–0.5)
Eosinophils Relative: 0 %
HCT: 31.3 % — ABNORMAL LOW (ref 34.8–46.6)
Hemoglobin: 10.3 g/dL — ABNORMAL LOW (ref 11.6–15.9)
Lymphocytes Relative: 10 %
Lymphs Abs: 0.5 10*3/uL — ABNORMAL LOW (ref 0.9–3.3)
MCH: 33 pg (ref 25.1–34.0)
MCHC: 32.9 g/dL (ref 31.5–36.0)
MCV: 100.3 fL (ref 79.5–101.0)
Monocytes Absolute: 0.4 10*3/uL (ref 0.1–0.9)
Monocytes Relative: 8 %
Neutro Abs: 4 10*3/uL (ref 1.5–6.5)
Neutrophils Relative %: 82 %
Platelets: 233 10*3/uL (ref 145–400)
RBC: 3.12 MIL/uL — ABNORMAL LOW (ref 3.70–5.45)
RDW: 14.5 % (ref 11.2–16.1)
WBC: 4.9 10*3/uL (ref 3.9–10.3)

## 2017-06-30 LAB — COMPREHENSIVE METABOLIC PANEL
ALT: 15 U/L (ref 0–55)
AST: 27 U/L (ref 5–34)
Albumin: 3.6 g/dL (ref 3.5–5.0)
Alkaline Phosphatase: 65 U/L (ref 40–150)
Anion gap: 9 (ref 3–11)
BUN: 15 mg/dL (ref 7–26)
CO2: 24 mmol/L (ref 22–29)
Calcium: 9.3 mg/dL (ref 8.4–10.4)
Chloride: 106 mmol/L (ref 98–109)
Creatinine, Ser: 1.06 mg/dL (ref 0.60–1.10)
GFR calc Af Amer: >60 mL/min (ref >60)
GFR calc non Af Amer: 52 mL/min — ABNORMAL LOW (ref >60)
Glucose, Bld: 158 mg/dL — ABNORMAL HIGH (ref 70–140)
Potassium: 3.9 mmol/L (ref 3.3–4.7)
Sodium: 139 mmol/L (ref 136–145)
Total Bilirubin: 0.6 mg/dL (ref 0.2–1.2)
Total Protein: 6.9 g/dL (ref 6.4–8.3)

## 2017-06-30 MED ORDER — SODIUM CHLORIDE 0.9 % IV SOLN
Freq: Once | INTRAVENOUS | Status: AC
  Administered 2017-06-30: 10:00:00 via INTRAVENOUS

## 2017-06-30 MED ORDER — SODIUM CHLORIDE 0.9 % IV SOLN
1000.0000 mg | Freq: Once | INTRAVENOUS | Status: AC
  Administered 2017-06-30: 1000 mg via INTRAVENOUS
  Filled 2017-06-30: qty 40

## 2017-06-30 MED ORDER — ZOLEDRONIC ACID 4 MG/100ML IV SOLN
4.0000 mg | Freq: Once | INTRAVENOUS | Status: AC
  Administered 2017-06-30: 4 mg via INTRAVENOUS
  Filled 2017-06-30: qty 100

## 2017-06-30 MED ORDER — PROCHLORPERAZINE MALEATE 10 MG PO TABS
ORAL_TABLET | ORAL | Status: AC
  Filled 2017-06-30: qty 1

## 2017-06-30 MED ORDER — HEPARIN SOD (PORK) LOCK FLUSH 100 UNIT/ML IV SOLN
500.0000 [IU] | Freq: Once | INTRAVENOUS | Status: AC | PRN
  Administered 2017-06-30: 500 [IU]
  Filled 2017-06-30: qty 5

## 2017-06-30 MED ORDER — SODIUM CHLORIDE 0.9% FLUSH
10.0000 mL | INTRAVENOUS | Status: DC | PRN
  Administered 2017-06-30: 10 mL
  Filled 2017-06-30: qty 10

## 2017-06-30 MED ORDER — SODIUM CHLORIDE 0.9% FLUSH
10.0000 mL | INTRAVENOUS | Status: DC | PRN
  Administered 2017-06-30: 08:00:00
  Filled 2017-06-30: qty 10

## 2017-06-30 MED ORDER — PROCHLORPERAZINE MALEATE 10 MG PO TABS
10.0000 mg | ORAL_TABLET | Freq: Once | ORAL | Status: AC
  Administered 2017-06-30: 10 mg via ORAL

## 2017-06-30 NOTE — Assessment & Plan Note (Signed)
This is a very pleasant 72 year old Hispanic female with metastatic non-small cell lung cancer, adenocarcinoma status post induction systemic chemotherapy with carboplatin and Alimta and she is currently on maintenance treatment with single agent Alimta status post 22 cycles. The patient continues to tolerate her maintenance treatment fairly well with no concerning complaints. Recommend for her to proceed with cycle 23 of her treatment today as scheduled.  She will come back for follow-up visit in 3 weeks for evaluation before the next cycle of her treatment.  The patient was advised to call immediately if she has any concerning symptoms in the interval. The patient voices understanding of current disease status and treatment options and is in agreement with the current care plan. All questions were answered. The patient knows to call the clinic with any problems, questions or concerns. We can certainly see the patient much sooner if necessary.

## 2017-06-30 NOTE — Progress Notes (Signed)
[place], MD Valerie Long [address] [place] [address]  DIAGNOSIS: Stage IV (T1b, N2, M1b) non-small cell lung cancer, adenocarcinoma diagnosed in March 2017 and presented with right lower lobe lung nodule in addition to mediastinal lymphadenopathy and metastatic bone lesions.  Molecular studies:PDL 1 TPS <1%.   [facility] Studies: Positive for ERBB2 [id number]. Negative for EGFR, KRAS, ALK, BRAF, MET, RET and ROS1.  PRIOR THERAPY: 1) Induction systemic chemotherapy with carboplatin for AUC of 5 and Alimta 500 MG/M2 every 3 weeks is status post 6 cycles at the [facility], last dose was given 03/05/2016 with stable disease. 2) palliative radiation to the metastatic bone disease in the lower back and pelvic area.  CURRENT THERAPY: 1)  Maintenance systemic chemotherapy with Alimta 500 MG/M2 every 3 weeks status post 22 cycles. First dose was given 03/25/2016.  2) Zometa 4 mg IV every 12 week for metastatic bone disease.  INTERVAL HISTORY: Valerie Long 72 y.o. female returns for routine follow-up visit by herself.  The patient is feeling fine today has no specific complaints except for persistent lower back pain.  She uses Tylenol as needed which is effective.  Denies fevers and chills.  Denies chest pain, shortness breath, cough, hemoptysis.  Denies nausea, vomiting, constipation, diarrhea.  Denies any recent weight loss or night sweats.  The patient is here for evaluation prior to cycle #23 of her maintenance Alimta.  MEDICAL HISTORY: Past Medical History:  Diagnosis Date  . Adenocarcinoma of right lung, stage 4 ([facility]) 2017  . Adenocarcinoma of right lung, stage 4 ([facility])   . Anemia   . Arthritis   . Bone metastases ([facility]) 02/24/2017  . Encounter for antineoplastic chemotherapy 03/17/2016  . Hypercholesterolemia   . Hypertension 06/18/2016  . Osteopenia   . Pelvic kidney    Left. On CT in [place]  . Pneumonia   . Shortness of breath dyspnea     ALLERGIES:  has No Known Allergies.  MEDICATIONS:  Current Outpatient Medications  Medication Sig Dispense Refill  . acetaminophen (TYLENOL) 500 MG tablet Take 1,000 mg by mouth every 6 (six) hours as needed for moderate pain or headache.    . Calcium Carb-Cholecalciferol (CALCIUM 600/VITAMIN D3 PO) Take by mouth.    . dexamethasone (DECADRON) 4 MG tablet Take 95m by mouth twice daily the day before, of, and after chemo 40 tablet 1  . Fish Oil-Cholecalciferol (OMEGA-3 + VITAMIN D3 PO) Take 1 tablet by mouth daily.    . folic acid (FOLVITE) 1 MG tablet Take 1 tablet (1 mg total) by mouth daily. 90 tablet 1  . lidocaine-prilocaine (EMLA) cream Apply 1 application topically as needed. Apply 1-2 tsp over port site 1-2 hours prior to chemo. 30 g 0  . PRESCRIPTION MEDICATION Inject 1 Dose as directed every 21 ( twenty-one) days. Chemo    . Cyanocobalamin (B-12 PO) Take 2 tablets by mouth daily.    . ondansetron (ZOFRAN) 8 MG tablet Take 8 mg by mouth every 8 (eight) hours as needed.     No current facility-administered medications for this visit.     SURGICAL HISTORY:  Past Surgical History:  Procedure Laterality Date  . CESAREAN SECTION     myomectomy  . COLONOSCOPY W/ POLYPECTOMY    . IR GENERIC HISTORICAL  08/17/2016   IR [place] ACC[place] RIGHT 08/17/2016 HJacqulynn Cadet MD [facility]  . IR GENERIC HISTORICAL  08/17/2016   IR FLUORO GUIDE PORT INSERTION RIGHT 08/17/2016 [name], MD [facility]  . MEDIASTINOSCOPY N/A 09/25/2015   Procedure: MEDIASTINOSCOPY;  Surgeon: Melrose Nakayama, MD;  Location: [facility];  Service: Thoracic;  Laterality: N/A;  . VIDEO BRONCHOSCOPY Bilateral 08/19/2015   Procedure: VIDEO BRONCHOSCOPY WITH FLUORO;  Surgeon: Rigoberto Noel, MD;  Location: [facility];  Service: Cardiopulmonary;  Laterality: Bilateral;  . VIDEO BRONCHOSCOPY N/A 09/08/2015   Procedure: VIDEO BRONCHOSCOPY WITH  FLUORO;  Surgeon: Rigoberto Noel, MD;  Location: [facility];  Service: Thoracic;  Laterality: N/A;  . VIDEO BRONCHOSCOPY WITH ENDOBRONCHIAL ULTRASOUND Right 09/08/2015   Procedure: ATTEMPTED VIDEO BRONCHOSCOPY ENDOBRONCHIAL ULTRASOUND  ;  Surgeon: Rigoberto Noel, MD;  Location: [facility];  Service: Thoracic;  Laterality: Right;    REVIEW OF SYSTEMS:   Review of Systems  Constitutional: Negative for appetite change, chills, fatigue, fever and unexpected weight change.  HENT:   Negative for mouth sores, nosebleeds, sore throat and trouble swallowing.   Eyes: Negative for eye problems and icterus.  Respiratory: Negative for cough, hemoptysis, shortness of breath and wheezing.   Cardiovascular: Negative for chest pain and leg swelling.  Gastrointestinal: Negative for abdominal pain, constipation, diarrhea, nausea and vomiting.  Genitourinary: Negative for bladder incontinence, difficulty urinating, dysuria, frequency and hematuria.   Musculoskeletal: Negative for gait problem, neck pain and neck stiffness. Positive for persistent lower back pain which is unchanged. Skin: Negative for itching and rash.  Neurological: Negative for dizziness, extremity weakness, gait problem, headaches, light-headedness and seizures.  Hematological: Negative for adenopathy. Does not bruise/bleed easily.  Psychiatric/Behavioral: Negative for confusion, depression and sleep disturbance. The patient is not nervous/anxious.     PHYSICAL EXAMINATION:  Blood pressure (!) 157/90, pulse 80, temperature 97.8 F (36.6 C), temperature source Oral, resp. rate 18, height '5\' 9"'  (1.753 m), weight 158 lb 4.8 oz (71.8 kg), last menstrual period 06/09/1998, SpO2 100 %.  ECOG PERFORMANCE STATUS: 1 - Symptomatic but completely ambulatory  Physical Exam  Constitutional: Oriented to person, place, and time and well-developed, well-nourished, and in no distress. No distress.  HENT:  Head: Normocephalic and atraumatic.  Mouth/Throat: Oropharynx  is clear and moist. No oropharyngeal exudate.  Eyes: Conjunctivae are normal. Right eye exhibits no discharge. Left eye exhibits no discharge. No scleral icterus.  Neck: Normal range of motion. Neck supple.  Cardiovascular: Normal rate, regular rhythm, normal heart sounds and intact distal pulses.   Pulmonary/Chest: Effort normal and breath sounds normal. No respiratory distress. No wheezes. No rales.  Abdominal: Soft. Bowel sounds are normal. Exhibits no distension and no mass. There is no tenderness.  Musculoskeletal: Normal range of motion. Exhibits no edema.  Lymphadenopathy:    No cervical adenopathy.  Neurological: Alert and oriented to person, place, and time. Exhibits normal muscle tone. Gait normal. Coordination normal.  Skin: Skin is warm and dry. No rash noted. Not diaphoretic. No erythema. No pallor.  Psychiatric: Mood, memory and judgment normal.  Vitals reviewed.  LABORATORY DATA: Lab Results  Component Value Date   WBC 4.9 06/30/2017   HGB 10.3 (L) 06/30/2017   HCT 31.3 (L) 06/30/2017   MCV 100.3 06/30/2017   PLT 233 06/30/2017      Chemistry      Component Value Date/Time   NA 139 06/30/2017 0804   NA 137 06/09/2017 0901   K 3.9 06/30/2017 0804   K 3.9 06/09/2017 0901   CL 106 06/30/2017 0804   CO2 24 06/30/2017 0804  CO2 25 06/09/2017 0901   BUN 15 06/30/2017 0804   BUN 19.0 06/09/2017 0901   CREATININE 1.06 06/30/2017 0804   CREATININE 1.2 (H) 06/09/2017 0901   GLU 170 01/23/2016      Component Value Date/Time   CALCIUM 9.3 06/30/2017 0804   CALCIUM 9.9 06/09/2017 0901   ALKPHOS 65 06/30/2017 0804   ALKPHOS 63 06/09/2017 0901   AST 27 06/30/2017 0804   AST 35 (H) 06/09/2017 0901   ALT 15 06/30/2017 0804   ALT 23 06/09/2017 0901   BILITOT 0.6 06/30/2017 0804   BILITOT 0.65 06/09/2017 0901       RADIOGRAPHIC STUDIES:  Ct Chest W Contrast  Result Date: 06/08/2017 CLINICAL DATA:  Lung cancer, non-small-cell. Adenocarcinoma diagnosed 3/17 in the  right lower lobe. Bone and nodal metastasis. Chemotherapy ongoing. Radiation therapy completed in November. EXAM: CT CHEST, ABDOMEN, AND PELVIS WITH CONTRAST TECHNIQUE: Multidetector CT imaging of the chest, abdomen and pelvis was performed following the standard protocol during bolus administration of intravenous contrast. CONTRAST:  125m ISOVUE-300 IOPAMIDOL (ISOVUE-300) INJECTION 61% COMPARISON:  03/29/2017 FINDINGS: CT CHEST FINDINGS Cardiovascular: Aortic atherosclerosis. Tortuous thoracic aorta. Mild cardiomegaly, accentuated by a pectus excavatum deformity. No pericardial effusion. No central pulmonary embolism, on this non-dedicated study. Right Port-A-Cath which terminates at the low SVC. Mediastinum/Nodes: No supraclavicular adenopathy. No mediastinal or hilar adenopathy. Lungs/Pleura: No pleural fluid. Right lower lobe pulmonary nodule measures 2.0 x 1.3 cm on image 81/series 5 versus 2.4 x 1.9 cm on the prior exam (when remeasured). 1.6 cm craniocaudal today on coronal image 60 versus similar to on the prior (when remeasured). Adjacent more anterior right lower lobe volume loss is similar. Smaller bilateral pulmonary nodules are again identified. Index right upper lobe 6 mm nodule on image 44/series 5, similar. A left upper lobe 5 mm nodule on image 66/series 5 is also not significantly changed. Musculoskeletal: Similar distribution and size of multifocal sclerotic osseous metastasis. CT ABDOMEN PELVIS FINDINGS Hepatobiliary: A too small to characterize left hepatic lobe lesion is unchanged. Multiple gallstones on the order of 4 mm, without acute cholecystitis or biliary duct dilatation. Pancreas: Normal, without mass or ductal dilatation. Spleen: Normal in size, without focal abnormality. Adrenals/Urinary Tract: Normal adrenal glands. Pelvic left kidney. Normal right kidney, without hydronephrosis. The bladder is thick walled, including on image 99/series 2. Stomach/Bowel: Normal stomach, without wall  thickening. Small bowel malrotation, with the colon all positioned in the left side of the abdomen and small bowel Positioned in the right sided abdomen. Ileocecal valve in the central pelvis, with the appendix positioned within the right central abdomen.Normal small bowel caliber. Vascular/Lymphatic: Aortic atherosclerosis. No abdominopelvic adenopathy. Reproductive: Normal uterus and adnexa. Other: No significant free fluid. No evidence of omental or peritoneal disease. Musculoskeletal: Multifocal sclerotic metastasis are similar in size and distribution. Convex left thoracolumbar spine curvature. IMPRESSION: CT CHEST IMPRESSION 1. Decreased size of right lower lobe primary. Pulmonary and osseous metastasis are similar. 2. No new or progressive disease within the chest. CT ABDOMEN AND PELVIS IMPRESSION 1. No soft tissue metastasis within the abdomen or pelvis. Similar osseous metastasis. 2. Cholelithiasis. 3. Bladder wall thickening. Consider correlation with urinalysis to exclude cystitis. 4. Small bowel malrotation. Electronically Signed   By: KAbigail MiyamotoM.D.   On: 06/08/2017 14:45   Ct Abdomen Pelvis W Contrast  Result Date: 06/08/2017 CLINICAL DATA:  Lung cancer, non-small-cell. Adenocarcinoma diagnosed 3/17 in the right lower lobe. Bone and nodal metastasis. Chemotherapy ongoing. Radiation therapy completed in November.  EXAM: CT CHEST, ABDOMEN, AND PELVIS WITH CONTRAST TECHNIQUE: Multidetector CT imaging of the chest, abdomen and pelvis was performed following the standard protocol during bolus administration of intravenous contrast. CONTRAST:  167m ISOVUE-300 IOPAMIDOL (ISOVUE-300) INJECTION 61% COMPARISON:  03/29/2017 FINDINGS: CT CHEST FINDINGS Cardiovascular: Aortic atherosclerosis. Tortuous thoracic aorta. Mild cardiomegaly, accentuated by a pectus excavatum deformity. No pericardial effusion. No central pulmonary embolism, on this non-dedicated study. Right Port-A-Cath which terminates at the low  SVC. Mediastinum/Nodes: No supraclavicular adenopathy. No mediastinal or hilar adenopathy. Lungs/Pleura: No pleural fluid. Right lower lobe pulmonary nodule measures 2.0 x 1.3 cm on image 81/series 5 versus 2.4 x 1.9 cm on the prior exam (when remeasured). 1.6 cm craniocaudal today on coronal image 60 versus similar to on the prior (when remeasured). Adjacent more anterior right lower lobe volume loss is similar. Smaller bilateral pulmonary nodules are again identified. Index right upper lobe 6 mm nodule on image 44/series 5, similar. A left upper lobe 5 mm nodule on image 66/series 5 is also not significantly changed. Musculoskeletal: Similar distribution and size of multifocal sclerotic osseous metastasis. CT ABDOMEN PELVIS FINDINGS Hepatobiliary: A too small to characterize left hepatic lobe lesion is unchanged. Multiple gallstones on the order of 4 mm, without acute cholecystitis or biliary duct dilatation. Pancreas: Normal, without mass or ductal dilatation. Spleen: Normal in size, without focal abnormality. Adrenals/Urinary Tract: Normal adrenal glands. Pelvic left kidney. Normal right kidney, without hydronephrosis. The bladder is thick walled, including on image 99/series 2. Stomach/Bowel: Normal stomach, without wall thickening. Small bowel malrotation, with the colon all positioned in the left side of the abdomen and small bowel Positioned in the right sided abdomen. Ileocecal valve in the central pelvis, with the appendix positioned within the right central abdomen.Normal small bowel caliber. Vascular/Lymphatic: Aortic atherosclerosis. No abdominopelvic adenopathy. Reproductive: Normal uterus and adnexa. Other: No significant free fluid. No evidence of omental or peritoneal disease. Musculoskeletal: Multifocal sclerotic metastasis are similar in size and distribution. Convex left thoracolumbar spine curvature. IMPRESSION: CT CHEST IMPRESSION 1. Decreased size of right lower lobe primary. Pulmonary and  osseous metastasis are similar. 2. No new or progressive disease within the chest. CT ABDOMEN AND PELVIS IMPRESSION 1. No soft tissue metastasis within the abdomen or pelvis. Similar osseous metastasis. 2. Cholelithiasis. 3. Bladder wall thickening. Consider correlation with urinalysis to exclude cystitis. 4. Small bowel malrotation. Electronically Signed   By: KAbigail MiyamotoM.D.   On: 06/08/2017 14:45     ASSESSMENT/PLAN:  Adenocarcinoma of right lung, stage 4 ([facility]) This is a very pleasant 72year old Hispanic female with metastatic non-small cell lung cancer, adenocarcinoma status post induction systemic chemotherapy with carboplatin and Alimta and she is currently on maintenance treatment with single agent Alimta status post 22 cycles. The patient continues to tolerate her maintenance treatment fairly well with no concerning complaints. Recommend for her to proceed with cycle 23 of her treatment today as scheduled.  She will come back for follow-up visit in 3 weeks for evaluation before the next cycle of her treatment.  The patient was advised to call immediately if she has any concerning symptoms in the interval. The patient voices understanding of current disease status and treatment options and is in agreement with the current care plan. All questions were answered. The patient knows to call the clinic with any problems, questions or concerns. We can certainly see the patient much sooner if necessary.  No orders of the defined types were placed in this encounter.   KErasmo Downer  Shadiamond Koska, DNP, AGPCNP-BC, AOCNP 06/30/17

## 2017-06-30 NOTE — Patient Instructions (Signed)
Valerie Long Discharge Instructions for Patients Receiving Chemotherapy  Today you received the following chemotherapy agents Alimta  To help prevent nausea and vomiting after your treatment, we encourage you to take your nausea medication as directed  If you develop nausea and vomiting that is not controlled by your nausea medication, call the clinic.   BELOW ARE SYMPTOMS THAT SHOULD BE REPORTED IMMEDIATELY:  *FEVER GREATER THAN 100.5 F  *CHILLS WITH OR WITHOUT FEVER  NAUSEA AND VOMITING THAT IS NOT CONTROLLED WITH YOUR NAUSEA MEDICATION  *UNUSUAL SHORTNESS OF BREATH  *UNUSUAL BRUISING OR BLEEDING  TENDERNESS IN MOUTH AND THROAT WITH OR WITHOUT PRESENCE OF ULCERS  *URINARY PROBLEMS  *BOWEL PROBLEMS  UNUSUAL RASH Items with * indicate a potential emergency and should be followed up as soon as possible.  Feel free to call the clinic should you have any questions or concerns. The clinic phone number is [phone].  Please show the [facility] at check-in to the Emergency Department and triage nurse.  Zoledronic Acid injection (Hypercalcemia, Oncology) What is this medicine? ZOLEDRONIC ACID (ZOE le dron ik AS id) lowers the amount of calcium loss from bone. It is used to treat too much calcium in your blood from cancer. It is also used to prevent complications of cancer that has spread to the bone. This medicine may be used for other purposes; ask your health care provider or pharmacist if you have questions. COMMON BRAND NAME(S): Zometa What should I tell my health care provider before I take this medicine? They need to know if you have any of these conditions: -aspirin-sensitive asthma -cancer, especially if you are receiving medicines used to treat cancer -dental disease or wear dentures -infection -kidney disease -receiving corticosteroids like dexamethasone or prednisone -an unusual or allergic reaction to zoledronic acid, other medicines, foods,  dyes, or preservatives -pregnant or trying to get pregnant -breast-feeding How should I use this medicine? This medicine is for infusion into a vein. It is given by a health care professional in a hospital or clinic setting. Talk to your pediatrician regarding the use of this medicine in children. Special care may be needed. Overdosage: If you think you have taken too much of this medicine contact a poison control center or emergency room at once. NOTE: This medicine is only for you. Do not share this medicine with others. What if I miss a dose? It is important not to miss your dose. Call your doctor or health care professional if you are unable to keep an appointment. What may interact with this medicine? -certain antibiotics given by injection -NSAIDs, medicines for pain and inflammation, like ibuprofen or naproxen -some diuretics like bumetanide, furosemide -teriparatide -thalidomide This list may not describe all possible interactions. Give your health care provider a list of all the medicines, herbs, non-prescription drugs, or dietary supplements you use. Also tell them if you smoke, drink alcohol, or use illegal drugs. Some items may interact with your medicine. What should I watch for while using this medicine? Visit your doctor or health care professional for regular checkups. It may be some time before you see the benefit from this medicine. Do not stop taking your medicine unless your doctor tells you to. Your doctor may order blood tests or other tests to see how you are doing. Women should inform their doctor if they wish to become pregnant or think they might be pregnant. There is a potential for serious side effects to an unborn child. Talk to your  health care professional or pharmacist for more information. You should make sure that you get enough calcium and vitamin D while you are taking this medicine. Discuss the foods you eat and the vitamins you take with your health care  professional. Some people who take this medicine have severe bone, joint, and/or muscle pain. This medicine may also increase your risk for jaw problems or a broken thigh bone. Tell your doctor right away if you have severe pain in your jaw, bones, joints, or muscles. Tell your doctor if you have any pain that does not go away or that gets worse. Tell your dentist and dental surgeon that you are taking this medicine. You should not have major dental surgery while on this medicine. See your dentist to have a dental exam and fix any dental problems before starting this medicine. Take good care of your teeth while on this medicine. Make sure you see your dentist for regular follow-up appointments. What side effects may I notice from receiving this medicine? Side effects that you should report to your doctor or health care professional as soon as possible: -allergic reactions like skin rash, itching or hives, swelling of the face, lips, or tongue -anxiety, confusion, or depression -breathing problems -changes in vision -eye pain -feeling faint or lightheaded, falls -jaw pain, especially after dental work -mouth sores -muscle cramps, stiffness, or weakness -redness, blistering, peeling or loosening of the skin, including inside the mouth -trouble passing urine or change in the amount of urine Side effects that usually do not require medical attention (report to your doctor or health care professional if they continue or are bothersome): -bone, joint, or muscle pain -constipation -diarrhea -fever -hair loss -irritation at site where injected -loss of appetite -nausea, vomiting -stomach upset -trouble sleeping -trouble swallowing -weak or tired This list may not describe all possible side effects. Call your doctor for medical advice about side effects. You may report side effects to FDA at 1-800-FDA-1088. Where should I keep my medicine? This drug is given in a hospital or clinic and will not  be stored at home. NOTE: This sheet is a summary. It may not cover all possible information. If you have questions about this medicine, talk to your doctor, pharmacist, or health care provider.  2018 Elsevier/Gold Standard (2013-10-20 14:19:39)

## 2017-06-30 NOTE — Telephone Encounter (Signed)
Appointments scheduled per 1/24 los patient will get copy of AVS from Infusion

## 2017-07-13 DIAGNOSIS — H2512 Age-related nuclear cataract, left eye: Secondary | ICD-10-CM | POA: Diagnosis not present

## 2017-07-13 DIAGNOSIS — H04123 Dry eye syndrome of bilateral lacrimal glands: Secondary | ICD-10-CM | POA: Diagnosis not present

## 2017-07-13 DIAGNOSIS — H25813 Combined forms of age-related cataract, bilateral: Secondary | ICD-10-CM | POA: Diagnosis not present

## 2017-07-21 ENCOUNTER — Telehealth: Payer: Self-pay

## 2017-07-21 ENCOUNTER — Ambulatory Visit: Payer: Medicare Other

## 2017-07-21 ENCOUNTER — Other Ambulatory Visit: Payer: Self-pay

## 2017-07-21 ENCOUNTER — Inpatient Hospital Stay: Payer: Medicare Other | Attending: Internal Medicine | Admitting: Oncology

## 2017-07-21 ENCOUNTER — Inpatient Hospital Stay: Payer: Medicare Other

## 2017-07-21 ENCOUNTER — Encounter: Payer: Self-pay | Admitting: Oncology

## 2017-07-21 VITALS — BP 157/86 | HR 76 | Temp 97.8°F | Resp 20 | Ht 69.0 in | Wt 157.8 lb

## 2017-07-21 DIAGNOSIS — Z5111 Encounter for antineoplastic chemotherapy: Secondary | ICD-10-CM

## 2017-07-21 DIAGNOSIS — C3491 Malignant neoplasm of unspecified part of right bronchus or lung: Secondary | ICD-10-CM

## 2017-07-21 DIAGNOSIS — C3431 Malignant neoplasm of lower lobe, right bronchus or lung: Secondary | ICD-10-CM | POA: Diagnosis not present

## 2017-07-21 DIAGNOSIS — C3490 Malignant neoplasm of unspecified part of unspecified bronchus or lung: Secondary | ICD-10-CM

## 2017-07-21 DIAGNOSIS — M545 Low back pain: Secondary | ICD-10-CM

## 2017-07-21 DIAGNOSIS — C7951 Secondary malignant neoplasm of bone: Secondary | ICD-10-CM

## 2017-07-21 DIAGNOSIS — Z79899 Other long term (current) drug therapy: Secondary | ICD-10-CM | POA: Diagnosis not present

## 2017-07-21 DIAGNOSIS — Z5112 Encounter for antineoplastic immunotherapy: Secondary | ICD-10-CM | POA: Diagnosis present

## 2017-07-21 LAB — COMPREHENSIVE METABOLIC PANEL
ALT: 16 U/L (ref 0–55)
AST: 33 U/L (ref 5–34)
Albumin: 3.7 g/dL (ref 3.5–5.0)
Alkaline Phosphatase: 61 U/L (ref 40–150)
Anion gap: 9 (ref 3–11)
BUN: 20 mg/dL (ref 7–26)
CO2: 23 mmol/L (ref 22–29)
Calcium: 9.4 mg/dL (ref 8.4–10.4)
Chloride: 103 mmol/L (ref 98–109)
Creatinine, Ser: 1.03 mg/dL (ref 0.60–1.10)
GFR calc Af Amer: >60 mL/min (ref >60)
GFR calc non Af Amer: 54 mL/min — ABNORMAL LOW (ref >60)
Glucose, Bld: 97 mg/dL (ref 70–140)
Potassium: 4.1 mmol/L (ref 3.5–5.1)
Sodium: 135 mmol/L — ABNORMAL LOW (ref 136–145)
Total Bilirubin: 0.6 mg/dL (ref 0.2–1.2)
Total Protein: 7 g/dL (ref 6.4–8.3)

## 2017-07-21 LAB — CBC WITH DIFFERENTIAL/PLATELET
Basophils Absolute: 0 10*3/uL (ref 0.0–0.1)
Basophils Relative: 0 %
Eosinophils Absolute: 0 10*3/uL (ref 0.0–0.5)
Eosinophils Relative: 0 %
HCT: 30.7 % — ABNORMAL LOW (ref 34.8–46.6)
Hemoglobin: 10.2 g/dL — ABNORMAL LOW (ref 11.6–15.9)
Lymphocytes Relative: 7 %
Lymphs Abs: 0.4 10*3/uL — ABNORMAL LOW (ref 0.9–3.3)
MCH: 33.3 pg (ref 25.1–34.0)
MCHC: 33.2 g/dL (ref 31.5–36.0)
MCV: 100.3 fL (ref 79.5–101.0)
Monocytes Absolute: 0.3 10*3/uL (ref 0.1–0.9)
Monocytes Relative: 4 %
Neutro Abs: 5.5 10*3/uL (ref 1.5–6.5)
Neutrophils Relative %: 89 %
Platelets: 254 10*3/uL (ref 145–400)
RBC: 3.06 MIL/uL — ABNORMAL LOW (ref 3.70–5.45)
RDW: 14.2 % (ref 11.2–14.5)
WBC: 6.3 10*3/uL (ref 3.9–10.3)

## 2017-07-21 MED ORDER — SODIUM CHLORIDE 0.9 % IV SOLN
1000.0000 mg | Freq: Once | INTRAVENOUS | Status: AC
  Administered 2017-07-21: 1000 mg via INTRAVENOUS
  Filled 2017-07-21: qty 40

## 2017-07-21 MED ORDER — SODIUM CHLORIDE 0.9 % IV SOLN
Freq: Once | INTRAVENOUS | Status: AC
  Administered 2017-07-21: 14:00:00 via INTRAVENOUS

## 2017-07-21 MED ORDER — PROCHLORPERAZINE MALEATE 10 MG PO TABS
10.0000 mg | ORAL_TABLET | Freq: Once | ORAL | Status: AC
  Administered 2017-07-21: 10 mg via ORAL

## 2017-07-21 MED ORDER — SODIUM CHLORIDE 0.9% FLUSH
10.0000 mL | INTRAVENOUS | Status: DC | PRN
  Administered 2017-07-21: 10 mL
  Filled 2017-07-21: qty 10

## 2017-07-21 MED ORDER — HEPARIN SOD (PORK) LOCK FLUSH 100 UNIT/ML IV SOLN
500.0000 [IU] | Freq: Once | INTRAVENOUS | Status: AC | PRN
  Administered 2017-07-21: 500 [IU]
  Filled 2017-07-21: qty 5

## 2017-07-21 MED ORDER — PROCHLORPERAZINE MALEATE 10 MG PO TABS
ORAL_TABLET | ORAL | Status: AC
  Filled 2017-07-21: qty 1

## 2017-07-21 MED ORDER — FOLIC ACID 1 MG PO TABS: 1.0000 mg | ORAL_TABLET | Freq: Every day | ORAL | 1 refills | Status: DC

## 2017-07-21 NOTE — Progress Notes (Signed)
Doffing, MD [address] [place] [address]  DIAGNOSIS: Stage IV (T1b, N2, M1b) non-small cell lung cancer, adenocarcinoma diagnosed in March 2017 and presented with right lower lobe lung nodule in addition to mediastinal lymphadenopathy and metastatic bone lesions.  Molecular studies:PDL 1 TPS <1%.   [facility] Studies: Positive for ERBB2 [id number]. Negative for EGFR, KRAS, ALK, BRAF, MET, RET and ROS1.  PRIOR THERAPY: 1) Induction systemic chemotherapy with carboplatin for AUC of 5 and Alimta 500 MG/M2 every 3 weeks is status post 6 cycles at the [facility], last dose was given 03/05/2016 with stable disease. 2) palliative radiation to the metastatic bone disease in the lower back and pelvic area.  CURRENT THERAPY: 1) Maintenance systemic chemotherapy with Alimta 500 MG/M2 every 3 weeks status post 23cycles. First dose was given 03/25/2016.  2) Zometa 4 mg IV every 12 week for metastatic bone disease.  INTERVAL HISTORY: Valerie Long 72 y.o. female returns for routine follow-up visit. The patient is feeling fine today has no specific complaints except for persistent lower back pain.  She uses Tylenol as needed which is effective.  Denies fevers and chills.  Denies chest pain, shortness breath, cough, hemoptysis.  Denies nausea, vomiting, constipation, diarrhea.  Denies any recent weight loss or night sweats.  The patient is here for evaluation prior to cycle #24 of her maintenance Alimta.  MEDICAL HISTORY: Past Medical History:  Diagnosis Date  . Adenocarcinoma of right lung, stage 4 ([place] [facility]) 2017  . Adenocarcinoma of right lung, stage 4 ([place])   . Anemia   . Arthritis   . Bone metastases ([place]) 02/24/2017  . Encounter for antineoplastic chemotherapy 03/17/2016  . Hypercholesterolemia   . Hypertension 06/18/2016  . Osteopenia   . 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  Pneumonia   . Shortness of breath dyspnea     ALLERGIES:  has No Known Allergies.  MEDICATIONS:  Current Outpatient Medications  Medication Sig Dispense Refill  . acetaminophen (TYLENOL) 500 MG tablet Take 1,000 mg by mouth every 6 (six) hours as needed for moderate pain or headache.    . Calcium Carb-Cholecalciferol (CALCIUM 600/VITAMIN D3 PO) Take by mouth.    . Cyanocobalamin (B-12 PO) Take 2 tablets by mouth daily.    Marland Kitchen dexamethasone (DECADRON) 4 MG tablet Take 32m by mouth twice daily the day before, of, and after chemo 40 tablet 1  . Fish Oil-Cholecalciferol (OMEGA-3 + VITAMIN D3 PO) Take 1 tablet by mouth daily.    . folic acid (FOLVITE) 1 MG tablet Take 1 tablet (1 mg total) by mouth daily. 90 tablet 1  . ketorolac (ACULAR) 0.5 % ophthalmic solution Place 1 drop into both eyes 4 (four) times daily.  0  . lidocaine-prilocaine (EMLA) cream Apply 1 application topically as needed. Apply 1-2 tsp over port site 1-2 hours prior to chemo. 30 g 0  . ofloxacin (OCUFLOX) 0.3 % ophthalmic solution Place 1 drop into both eyes 4 (four) times daily.  0  . ondansetron (ZOFRAN) 8 MG tablet Take 8 mg by mouth every 8 (eight) hours as needed.    . prednisoLONE acetate (PRED FORTE) 1 % ophthalmic suspension Place 1 drop into both eyes 4 (four) times daily.  0  . PRESCRIPTION MEDICATION Inject 1 Dose as directed every 21 ( twenty-one) days. Chemo     No current facility-administered medications for this visit.  Facility-Administered Medications Ordered in Other Visits  Medication Dose Route Frequency Provider Last Rate Last Dose  . heparin lock flush 100 unit/mL  500 Units Intracatheter Once PRN Curt Bears, MD      . PEMEtrexed (ALIMTA) 1,000 mg in sodium chloride 0.9 % 100 mL chemo infusion  1,000 mg Intravenous Once Curt Bears, MD      . sodium chloride flush (NS) 0.9 % injection 10 mL  10 mL Intracatheter PRN Curt Bears, MD        SURGICAL HISTORY:  Past Surgical History:   Procedure Laterality Date  . CESAREAN SECTION     myomectomy  . COLONOSCOPY W/ POLYPECTOMY    . IR GENERIC HISTORICAL  08/17/2016   IR US GUIDE VASC ACCESS RIGHT 08/17/2016 [name], MD [facility]  . IR GENERIC HISTORICAL  08/17/2016   IR FLUORO GUIDE PORT INSERTION RIGHT 08/17/2016 [name], MD [facility]  . MEDIASTINOSCOPY N/A 09/25/2015   Procedure: MEDIASTINOSCOPY;  Surgeon: Melrose Nakayama, MD;  Location: [facility];  Service: Thoracic;  Laterality: N/A;  . VIDEO BRONCHOSCOPY Bilateral 08/19/2015   Procedure: VIDEO BRONCHOSCOPY WITH FLUORO;  Surgeon: Rigoberto Noel, MD;  Location: [facility];  Service: Cardiopulmonary;  Laterality: Bilateral;  . VIDEO BRONCHOSCOPY N/A 09/08/2015   Procedure: VIDEO BRONCHOSCOPY WITH FLUORO;  Surgeon: Rigoberto Noel, MD;  Location: [facility];  Service: Thoracic;  Laterality: N/A;  . VIDEO BRONCHOSCOPY WITH ENDOBRONCHIAL ULTRASOUND Right 09/08/2015   Procedure: ATTEMPTED VIDEO BRONCHOSCOPY ENDOBRONCHIAL ULTRASOUND  ;  Surgeon: Rigoberto Noel, MD;  Location: [facility];  Service: Thoracic;  Laterality: Right;    REVIEW OF SYSTEMS:   Review of Systems  Constitutional: Negative for appetite change, chills, fatigue, fever and unexpected weight change.  HENT:   Negative for mouth sores, nosebleeds, sore throat and trouble swallowing.   Eyes: Negative for eye problems and icterus.  Respiratory: Negative for cough, hemoptysis, shortness of breath and wheezing.   Cardiovascular: Negative for chest pain and leg swelling.  Gastrointestinal: Negative for abdominal pain, constipation, diarrhea, nausea and vomiting.  Genitourinary: Negative for bladder incontinence, difficulty urinating, dysuria, frequency and hematuria.   Musculoskeletal: Negative for gait problem, neck pain and neck stiffness. Positive for persistent back pain. Skin: Negative for itching and rash.  Neurological: Negative for dizziness, extremity weakness, gait problem, headaches,  light-headedness and seizures.  Hematological: Negative for adenopathy. Does not bruise/bleed easily.  Psychiatric/Behavioral: Negative for confusion, depression and sleep disturbance. The patient is not nervous/anxious.     PHYSICAL EXAMINATION:  Blood pressure (!) 157/86, pulse 76, temperature 97.8 F (36.6 C), temperature source Oral, resp. rate 20, height 5' 9" (1.753 m), weight 157 lb 12.8 oz (71.6 kg), last menstrual period 06/09/1998, SpO2 100 %.  ECOG PERFORMANCE STATUS: 1 - Symptomatic but completely ambulatory  Physical Exam  Constitutional: Oriented to person, place, and time and well-developed, well-nourished, and in no distress. No distress.  HENT:  Head: Normocephalic and atraumatic.  Mouth/Throat: Oropharynx is clear and moist. No oropharyngeal exudate.  Eyes: Conjunctivae are normal. Right eye exhibits no discharge. Left eye exhibits no discharge. No scleral icterus.  Neck: Normal range of motion. Neck supple.  Cardiovascular: Normal rate, regular rhythm, normal heart sounds and intact distal pulses.   Pulmonary/Chest: Effort normal and breath sounds normal. No respiratory distress. No wheezes. No rales.  Abdominal: Soft. Bowel sounds are normal. Exhibits no distension and no mass. There is no tenderness.  Musculoskeletal: Normal range of motion. Exhibits no edema.  Lymphadenopathy:  No cervical adenopathy.  Neurological: Alert and oriented to person, place, and time. Exhibits normal muscle tone. Gait normal. Coordination normal.  Skin: Skin is warm and dry. No rash noted. Not diaphoretic. No erythema. No pallor.  Psychiatric: Mood, memory and judgment normal.  Vitals reviewed.  LABORATORY DATA: Lab Results  Component Value Date   WBC 6.3 07/21/2017   HGB 10.2 (L) 07/21/2017   HCT 30.7 (L) 07/21/2017   MCV 100.3 07/21/2017   PLT 254 07/21/2017      Chemistry      Component Value Date/Time   NA 135 (L) 07/21/2017 1216   NA 137 06/09/2017 0901   K 4.1  07/21/2017 1216   K 3.9 06/09/2017 0901   CL 103 07/21/2017 1216   CO2 23 07/21/2017 1216   CO2 25 06/09/2017 0901   BUN 20 07/21/2017 1216   BUN 19.0 06/09/2017 0901   CREATININE 1.03 07/21/2017 1216   CREATININE 1.2 (H) 06/09/2017 0901   GLU 170 01/23/2016      Component Value Date/Time   CALCIUM 9.4 07/21/2017 1216   CALCIUM 9.9 06/09/2017 0901   ALKPHOS 61 07/21/2017 1216   ALKPHOS 63 06/09/2017 0901   AST 33 07/21/2017 1216   AST 35 (H) 06/09/2017 0901   ALT 16 07/21/2017 1216   ALT 23 06/09/2017 0901   BILITOT 0.6 07/21/2017 1216   BILITOT 0.65 06/09/2017 0901       RADIOGRAPHIC STUDIES:  No results found.   ASSESSMENT/PLAN:  Adenocarcinoma of right lung, stage 4 ([facility]) This is a very pleasant 72 year old Hispanic female with metastatic non-small cell lung cancer, adenocarcinoma status post induction systemic chemotherapy with carboplatin and Alimta and she is currently on maintenance treatment with single agent Alimta status post 23cycles. The patient continues to tolerate her maintenance treatment fairly well with no concerning complaints. Recommend for her to proceed with cycle 24 of her treatment today as scheduled. The patient will have CT scans of the chest, abdomen, pelvis prior to her next visit for restaging.  She will come back for follow-up visit in 3 weeks for evaluation before the next cycle of her treatment and to review her restaging CT scan results.  The patient was advised to call immediately if she has any concerning symptoms in the interval. The patient voices understanding of current disease status and treatment options and is in agreement with the current care plan. All questions were answered. The patient knows to call the clinic with any problems, questions or concerns. We can certainly see the patient much sooner if necessary.  Orders Placed This Encounter  Procedures  . CT ABDOMEN PELVIS W CONTRAST    Standing Status:   Future     Standing Expiration Date:   07/21/2018    Order Specific Question:   If indicated for the ordered procedure, I authorize the administration of contrast media per Radiology protocol    Answer:   Yes    Order Specific Question:   Preferred imaging location?    Answer:   [facility]    Order Specific Question:   Radiology Contrast Protocol - do NOT remove file path    Answer:   _0 [email or web address]    Order Specific Question:   Reason for Exam additional comments    Answer:   Metastatic lung cancer. Restaging.  . CT CHEST W CONTRAST    Standing Status:   Future    Standing Expiration Date:   07/21/2018    Order Specific Question:  If indicated for the ordered procedure, I authorize the administration of contrast media per Radiology protocol    Answer:   Yes    Order Specific Question:   Preferred imaging location?    Answer:   [facility]    Order Specific Question:   Radiology Contrast Protocol - do NOT remove file path    Answer:   _0 [email or web address]    Order Specific Question:   Reason for Exam additional comments    Answer:   Metastatic lung cancer. Restaging.    Mikey Bussing, DNP, AGPCNP-BC, AOCNP 07/21/17

## 2017-07-21 NOTE — Patient Instructions (Signed)
[facility] Discharge Instructions for Patients Receiving Chemotherapy  Today you received the following chemotherapy agents Alimta  To help prevent nausea and vomiting after your treatment, we encourage you to take your nausea medication as directed  If you develop nausea and vomiting that is not controlled by your nausea medication, call the clinic.   BELOW ARE SYMPTOMS THAT SHOULD BE REPORTED IMMEDIATELY:  *FEVER GREATER THAN 100.5 F  *CHILLS WITH OR WITHOUT FEVER  NAUSEA AND VOMITING THAT IS NOT CONTROLLED WITH YOUR NAUSEA MEDICATION  *UNUSUAL SHORTNESS OF BREATH  *UNUSUAL BRUISING OR BLEEDING  TENDERNESS IN MOUTH AND THROAT WITH OR WITHOUT PRESENCE OF ULCERS  *URINARY PROBLEMS  *BOWEL PROBLEMS  UNUSUAL RASH Items with * indicate a potential emergency and should be followed up as soon as possible.  Feel free to call the clinic should you have any questions or concerns. The clinic phone number is [phone].  Please show the [facility] at check-in to the Emergency Department and triage nurse.

## 2017-07-21 NOTE — Telephone Encounter (Signed)
Patient declined avs and calender of upcoming appointments that was already scheduled by [place]. Per 2/14 los

## 2017-07-21 NOTE — Patient Instructions (Signed)

## 2017-07-21 NOTE — Assessment & Plan Note (Signed)
This is a very pleasant 72 year old Hispanic female with metastatic non-small cell lung cancer, adenocarcinoma status post induction systemic chemotherapy with carboplatin and Alimta and she is currently on maintenance treatment with single agent Alimta status post 23cycles. The patient continues to tolerate her maintenance treatment fairly well with no concerning complaints. Recommend for her to proceed with cycle 24 of her treatment today as scheduled. The patient will have CT scans of the chest, abdomen, pelvis prior to her next visit for restaging.  She will come back for follow-up visit in 3 weeks for evaluation before the next cycle of her treatment and to review her restaging CT scan results.  The patient was advised to call immediately if she has any concerning symptoms in the interval. The patient voices understanding of current disease status and treatment options and is in agreement with the current care plan. All questions were answered. The patient knows to call the clinic with any problems, questions or concerns. We can certainly see the patient much sooner if necessary.

## 2017-08-04 DIAGNOSIS — Z79899 Other long term (current) drug therapy: Secondary | ICD-10-CM | POA: Diagnosis not present

## 2017-08-04 DIAGNOSIS — M130 Polyarthritis, unspecified: Secondary | ICD-10-CM | POA: Diagnosis not present

## 2017-08-04 DIAGNOSIS — E349 Endocrine disorder, unspecified: Secondary | ICD-10-CM | POA: Diagnosis not present

## 2017-08-05 ENCOUNTER — Telehealth: Payer: Self-pay | Admitting: *Deleted

## 2017-08-05 NOTE — Telephone Encounter (Signed)
Oncology Nurse Navigator Documentation  Oncology Nurse Navigator Flowsheets 08/05/2017  Navigator Location [facility]  Navigator Encounter Type Telephone/I checked on Valerie Long schedule. She needs CT scans before she is seen next week. A family member picked up the phone and told me to call back later.   Treatment Phase Treatment  Barriers/Navigation Needs Coordination of Care  Interventions Coordination of Care  Coordination of Care Other  Acuity Level 1  Time Spent with Patient 15

## 2017-08-06 DIAGNOSIS — J101 Influenza due to other identified influenza virus with other respiratory manifestations: Secondary | ICD-10-CM | POA: Diagnosis not present

## 2017-08-06 DIAGNOSIS — C3490 Malignant neoplasm of unspecified part of unspecified bronchus or lung: Secondary | ICD-10-CM | POA: Diagnosis not present

## 2017-08-06 DIAGNOSIS — J1000 Influenza due to other identified influenza virus with unspecified type of pneumonia: Secondary | ICD-10-CM | POA: Diagnosis not present

## 2017-08-06 DIAGNOSIS — R05 Cough: Secondary | ICD-10-CM | POA: Diagnosis not present

## 2017-08-06 DIAGNOSIS — J189 Pneumonia, unspecified organism: Secondary | ICD-10-CM | POA: Diagnosis not present

## 2017-08-08 ENCOUNTER — Telehealth: Payer: Self-pay | Admitting: *Deleted

## 2017-08-08 ENCOUNTER — Telehealth: Payer: Self-pay | Admitting: Medical Oncology

## 2017-08-08 ENCOUNTER — Telehealth: Payer: Self-pay | Admitting: Internal Medicine

## 2017-08-08 NOTE — Telephone Encounter (Signed)
Per 3/4 sch message - cancelled and r./s appt to next week - left message with appt date and time.

## 2017-08-08 NOTE — Telephone Encounter (Signed)
Oncology Nurse Navigator Documentation  Oncology Nurse Navigator Flowsheets 08/08/2017  Navigator Location [facility]  Navigator Encounter Type Telephone/I called to update patient on need for CT scan. I was unable to reach but did leave a vm message for her to call me with my name and phone number.  Telephone Outgoing Call  Treatment Phase Treatment  Barriers/Navigation Needs Coordination of Care  Interventions Coordination of Care  Coordination of Care Other  Acuity Level 1  Time Spent with Patient 15

## 2017-08-08 NOTE — Telephone Encounter (Signed)
In [place] -was positive for flu and pneumonia . On antibiotic and tamiflu-for 2 days. Has appt Thursday -Does she need to keep it? Per [facility] cancel appt this week . Schedule message sent to r/s for next week.

## 2017-08-11 ENCOUNTER — Other Ambulatory Visit: Payer: Medicare Other

## 2017-08-11 ENCOUNTER — Ambulatory Visit: Payer: Medicare Other | Admitting: Oncology

## 2017-08-11 ENCOUNTER — Ambulatory Visit: Payer: Medicare Other

## 2017-08-12 ENCOUNTER — Encounter (HOSPITAL_COMMUNITY): Payer: Self-pay

## 2017-08-12 ENCOUNTER — Other Ambulatory Visit: Payer: Self-pay

## 2017-08-12 ENCOUNTER — Emergency Department (HOSPITAL_COMMUNITY): Payer: Medicare Other

## 2017-08-12 ENCOUNTER — Emergency Department (HOSPITAL_COMMUNITY)
Admission: EM | Admit: 2017-08-12 | Discharge: 2017-08-12 | Disposition: A | Discharge status: Home / Self Care | Payer: Medicare Other | Attending: Emergency Medicine | Admitting: Emergency Medicine

## 2017-08-12 ENCOUNTER — Ambulatory Visit: Payer: Medicare Other | Admitting: Internal Medicine

## 2017-08-12 DIAGNOSIS — J9811 Atelectasis: Secondary | ICD-10-CM | POA: Diagnosis not present

## 2017-08-12 DIAGNOSIS — I10 Essential (primary) hypertension: Secondary | ICD-10-CM | POA: Insufficient documentation

## 2017-08-12 DIAGNOSIS — C3491 Malignant neoplasm of unspecified part of right bronchus or lung: Secondary | ICD-10-CM | POA: Diagnosis not present

## 2017-08-12 DIAGNOSIS — Z79899 Other long term (current) drug therapy: Secondary | ICD-10-CM | POA: Insufficient documentation

## 2017-08-12 DIAGNOSIS — M549 Dorsalgia, unspecified: Secondary | ICD-10-CM | POA: Insufficient documentation

## 2017-08-12 DIAGNOSIS — D709 Neutropenia, unspecified: Secondary | ICD-10-CM | POA: Diagnosis not present

## 2017-08-12 DIAGNOSIS — R51 Headache: Secondary | ICD-10-CM | POA: Diagnosis not present

## 2017-08-12 DIAGNOSIS — R52 Pain, unspecified: Secondary | ICD-10-CM

## 2017-08-12 DIAGNOSIS — J111 Influenza due to unidentified influenza virus with other respiratory manifestations: Secondary | ICD-10-CM | POA: Diagnosis not present

## 2017-08-12 DIAGNOSIS — M546 Pain in thoracic spine: Secondary | ICD-10-CM | POA: Diagnosis not present

## 2017-08-12 LAB — BASIC METABOLIC PANEL
Anion gap: 6 (ref 5–15)
BUN: 10 mg/dL (ref 6–20)
CO2: 27 mmol/L (ref 22–32)
Calcium: 9.2 mg/dL (ref 8.9–10.3)
Chloride: 108 mmol/L (ref 101–111)
Creatinine, Ser: 1.1 mg/dL — ABNORMAL HIGH (ref 0.44–1.00)
GFR calc Af Amer: 58 mL/min — ABNORMAL LOW (ref >60)
GFR calc non Af Amer: 50 mL/min — ABNORMAL LOW (ref >60)
Glucose, Bld: 102 mg/dL — ABNORMAL HIGH (ref 65–99)
Potassium: 4.6 mmol/L (ref 3.5–5.1)
Sodium: 141 mmol/L (ref 135–145)

## 2017-08-12 LAB — I-STAT CHEM 8, ED
BUN: 8 mg/dL (ref 6–20)
Calcium, Ion: 1.18 mmol/L (ref 1.15–1.40)
Chloride: 105 mmol/L (ref 101–111)
Creatinine, Ser: 1.1 mg/dL — ABNORMAL HIGH (ref 0.44–1.00)
Glucose, Bld: 96 mg/dL (ref 65–99)
HCT: 31 % — ABNORMAL LOW (ref 36.0–46.0)
Hemoglobin: 10.5 g/dL — ABNORMAL LOW (ref 12.0–15.0)
Potassium: 4.5 mmol/L (ref 3.5–5.1)
Sodium: 141 mmol/L (ref 135–145)
TCO2: 25 mmol/L (ref 22–32)

## 2017-08-12 LAB — CBC WITH DIFFERENTIAL/PLATELET
Basophils Absolute: 0 10*3/uL (ref 0.0–0.1)
Basophils Relative: 0 %
Eosinophils Absolute: 0 10*3/uL (ref 0.0–0.7)
Eosinophils Relative: 1 %
HCT: 31.5 % — ABNORMAL LOW (ref 36.0–46.0)
Hemoglobin: 10.4 g/dL — ABNORMAL LOW (ref 12.0–15.0)
Lymphocytes Relative: 29 %
Lymphs Abs: 0.8 10*3/uL (ref 0.7–4.0)
MCH: 33.2 pg (ref 26.0–34.0)
MCHC: 33 g/dL (ref 30.0–36.0)
MCV: 100.6 fL — ABNORMAL HIGH (ref 78.0–100.0)
Monocytes Absolute: 0.5 10*3/uL (ref 0.1–1.0)
Monocytes Relative: 18 %
Neutro Abs: 1.5 10*3/uL — ABNORMAL LOW (ref 1.7–7.7)
Neutrophils Relative %: 52 %
Platelets: 272 10*3/uL (ref 150–400)
RBC: 3.13 MIL/uL — ABNORMAL LOW (ref 3.87–5.11)
RDW: 14.4 % (ref 11.5–15.5)
WBC: 2.9 10*3/uL — ABNORMAL LOW (ref 4.0–10.5)

## 2017-08-12 LAB — URINALYSIS, ROUTINE W REFLEX MICROSCOPIC
Bilirubin Urine: NEGATIVE
Glucose, UA: NEGATIVE mg/dL
Hgb urine dipstick: NEGATIVE
Ketones, ur: NEGATIVE mg/dL
Leukocytes, UA: NEGATIVE
Nitrite: NEGATIVE
Protein, ur: NEGATIVE mg/dL
Specific Gravity, Urine: 1.017 (ref 1.005–1.030)
pH: 7 (ref 5.0–8.0)

## 2017-08-12 MED ORDER — IOPAMIDOL (ISOVUE-370) INJECTION 76%
INTRAVENOUS | Status: AC
  Administered 2017-08-12: 100 mL via INTRAVENOUS
  Filled 2017-08-12: qty 100

## 2017-08-12 MED ORDER — FENTANYL CITRATE (PF) 100 MCG/2ML IJ SOLN
50.0000 ug | Freq: Once | INTRAMUSCULAR | Status: AC
Start: 2017-08-12 — End: 2017-08-12
  Administered 2017-08-12: 50 ug via INTRAVENOUS
  Filled 2017-08-12: qty 2

## 2017-08-12 MED ORDER — BACLOFEN 10 MG PO TABS: 10.0000 mg | ORAL_TABLET | Freq: Three times a day (TID) | ORAL | 0 refills | Status: DC

## 2017-08-12 MED ORDER — SODIUM CHLORIDE 0.9 % IV BOLUS (SEPSIS)
500.0000 mL | Freq: Once | INTRAVENOUS | Status: AC
  Administered 2017-08-12: 500 mL via INTRAVENOUS

## 2017-08-12 MED ORDER — MELOXICAM 15 MG PO TABS: 15.0000 mg | ORAL_TABLET | Freq: Every day | ORAL | 0 refills | Status: DC

## 2017-08-12 MED ORDER — ONDANSETRON HCL 4 MG/2ML IJ SOLN
4.0000 mg | Freq: Once | INTRAMUSCULAR | Status: AC
  Administered 2017-08-12: 4 mg via INTRAVENOUS
  Filled 2017-08-12: qty 2

## 2017-08-12 MED ORDER — SODIUM CHLORIDE 0.9 % IJ SOLN
INTRAMUSCULAR | Status: AC
  Administered 2017-08-12: 19:00:00
  Filled 2017-08-12: qty 50

## 2017-08-12 NOTE — ED Provider Notes (Signed)
[facility] DEPT Provider Note   CSN: [id number] Arrival date & time: 08/12/17  1407     History   Chief Complaint Chief Complaint  Patient presents with  . Back Pain  . Cough  . chemo patient    HPI Valerie Long is a 72 y.o. female with a past medical history of stage IV lung cancer with known bone metastases who presents the emergency department with chief complaint of back pain.  Patient states that she had onset of pain over the past 3 days.  Patient traveled back from [place] in a car all day yesterday.  She complains of pain along the left side of her spine which is worse with deep breathing twisting or moving.  She denies hemoptysis but does have a productive cough.  She is diagnosed with influenza 2 weeks ago and is finished a course of Tamiflu and is currently on moxifloxacin.  She denies fevers or chills.  She denies unilateral leg swelling or pain.  HPI  Past Medical History:  Diagnosis Date  . Adenocarcinoma of right lung, stage 4 ([facility]) 2017  . Adenocarcinoma of right lung, stage 4 ([place])   . Anemia   . Arthritis   . Bone metastases ([facility]) 02/24/2017  . Encounter for antineoplastic chemotherapy 03/17/2016  . Hypercholesterolemia   . Hypertension 06/18/2016  . Osteopenia   . 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 Pneumonia   . Shortness of breath dyspnea     Patient Active Problem List   Diagnosis Date Noted  . Bone metastases ([facility]) 02/24/2017  . 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 Hypertension 06/18/2016  . Encounter for antineoplastic chemotherapy 03/17/2016  . Adenocarcinoma of right lung, stage 4 ([facility])   . Lung mass 07/08/2015  . PCP NOTES >>> 03/22/2015  . CTS (carpal tunnel syndrome) 12/06/2013  . Annual physical exam 08/29/2012  . Pain in joint, shoulder region 08/29/2012  . Vaginal atrophy 06/17/2011  . Osteopenia   . Hypercholesterolemia     Past Surgical History:  Procedure  Laterality Date  . CESAREAN SECTION     myomectomy  . COLONOSCOPY W/ POLYPECTOMY    . IR GENERIC HISTORICAL  08/17/2016   IR US GUIDE VASC ACCESS RIGHT 08/17/2016 [name], MD [facility]  . IR GENERIC HISTORICAL  08/17/2016   IR FLUORO GUIDE PORT INSERTION RIGHT 08/17/2016 [name], MD [facility]  . MEDIASTINOSCOPY N/A 09/25/2015   Procedure: MEDIASTINOSCOPY;  Surgeon: Melrose Nakayama, MD;  Location: [facility];  Service: Thoracic;  Laterality: N/A;  . VIDEO BRONCHOSCOPY Bilateral 08/19/2015   Procedure: VIDEO BRONCHOSCOPY WITH FLUORO;  Surgeon: Rigoberto Noel, MD;  Location: [facility];  Service: Cardiopulmonary;  Laterality: Bilateral;  . VIDEO BRONCHOSCOPY N/A 09/08/2015   Procedure: VIDEO BRONCHOSCOPY WITH FLUORO;  Surgeon: Rigoberto Noel, MD;  Location: [facility];  Service: Thoracic;  Laterality: N/A;  . VIDEO BRONCHOSCOPY WITH ENDOBRONCHIAL ULTRASOUND Right 09/08/2015   Procedure: ATTEMPTED VIDEO BRONCHOSCOPY ENDOBRONCHIAL ULTRASOUND  ;  Surgeon: Rigoberto Noel, MD;  Location[facility];  Service: Thoracic;  Laterality: Right;    OB History    Gravida Para Term Preterm AB Living   1 0     1 0   SAB TAB Ectopic Multiple Live Births   1               Home Medications    Prior to Admission medications   Medication Sig Start  Date End Date Taking? Authorizing Provider  acetaminophen (TYLENOL) 500 MG tablet Take 1,000 mg by mouth every 6 (six) hours as needed for moderate pain or headache.   Yes [provider]  Calcium Carb-Cholecalciferol (CALCIUM 600/VITAMIN D3 PO) Take by mouth.   Yes [provider]  Cyanocobalamin (B-12 PO) Take 2 tablets by mouth daily.   Yes [provider]  dexamethasone (DECADRON) 4 MG tablet Take 4mg  by mouth twice daily the day before, of, and after chemo 04/29/17  Yes Curcio, Roselie Awkward, NP  Fish Oil-Cholecalciferol (OMEGA-3 + VITAMIN D3 PO) Take 1 tablet by mouth daily.   Yes [provider]  folic acid (FOLVITE)  1 MG tablet Take 1 tablet (1 mg total) by mouth daily. 07/21/17  Yes Curcio, Roselie Awkward, NP  ketorolac (ACULAR) 0.5 % ophthalmic solution Place 1 drop into both eyes 4 (four) times daily. 07/06/17  Yes [provider]  lidocaine-prilocaine (EMLA) cream Apply 1 application topically as needed. Apply 1-2 tsp over port site 1-2 hours prior to chemo. 02/24/17  Yes Curt Bears, MD  moxifloxacin (AVELOX) 400 MG tablet Take 400 mg by mouth daily. 08/07/17  Yes [provider]  ofloxacin (OCUFLOX) 0.3 % ophthalmic solution Place 1 drop into both eyes 4 (four) times daily. 07/06/17  Yes [provider]  oseltamivir (TAMIFLU) 30 MG capsule Take 30 mg by mouth daily. 08/07/17  Yes [provider]  prednisoLONE acetate (PRED FORTE) 1 % ophthalmic suspension Place 1 drop into both eyes 4 (four) times daily. 07/06/17  Yes [provider]  PRESCRIPTION MEDICATION Inject 1 Dose as directed every 21 ( twenty-one) days. Chemo   Yes [provider]  ondansetron (ZOFRAN) 8 MG tablet Take 8 mg by mouth every 8 (eight) hours as needed. 11/14/15   [provider]    Family History Family History  Problem Relation Age of Onset  . Hypertension Mother        F and M  . CAD Father   . Diabetes Other        auncle-aunts   . Cancer Other        UTERINE   . Breast cancer Sister   . Stroke Neg Hx   . Colon cancer Neg Hx     Social History Social History   Tobacco Use  . Smoking status: Never Smoker  . Smokeless tobacco: Never Used  Substance Use Topics  . Alcohol use: Yes    Alcohol/week: 0.0 oz    Comment: SOCIAL  . Drug use: No     Allergies   Patient has no known allergies.   Review of Systems Review of Systems  Ten systems reviewed and are negative for acute change, except as noted in the HPI.   Physical Exam Updated Vital Signs BP (!) 132/92 (BP Location: Left Arm)   Pulse 92   Temp 97.8 F (36.6 C) (Oral)   Resp 17   Ht 5\' 4"   (1.626 m)   Wt 69.4 kg (153 lb)   LMP 06/09/1998   SpO2 100%   BMI 26.26 kg/m   Physical Exam  Constitutional: She is oriented to person, place, and time. She appears well-developed and well-nourished. No distress.  HENT:  Head: Normocephalic and atraumatic.  Eyes: Conjunctivae are normal. No scleral icterus.  Neck: Normal range of motion.  Cardiovascular: Normal rate, regular rhythm and normal heart sounds. Exam reveals no gallop and no friction rub.  No murmur heard. Pulmonary/Chest: Effort normal and  breath sounds normal. No respiratory distress.  Abdominal: Soft. Bowel sounds are normal. She exhibits no distension and no mass. There is no tenderness. There is no guarding.  Musculoskeletal:       Arms: No midline spinal tenderness, tender to palpation along the left erector Spinnae region of the lower thoracic spine.  Pain is worse with election extension and right lateral rotation.  Neurological: She is alert and oriented to person, place, and time.  Skin: Skin is warm and dry. She is not diaphoretic.  Psychiatric: Her behavior is normal.  Nursing note and vitals reviewed.    ED Treatments / Results  Labs (all labs ordered are listed, but only abnormal results are displayed) Labs Reviewed  CBC WITH DIFFERENTIAL/PLATELET - Abnormal; Notable for the following components:      Result Value   WBC 2.9 (*)    RBC 3.13 (*)    Hemoglobin 10.4 (*)    HCT 31.5 (*)    MCV 100.6 (*)    Neutro Abs 1.5 (*)    All other components within normal limits  I-STAT CHEM 8, ED - Abnormal; Notable for the following components:   Creatinine, Ser 1.10 (*)    Hemoglobin 10.5 (*)    HCT 31.0 (*)    All other components within normal limits  BASIC METABOLIC PANEL    EKG  EKG Interpretation None       Radiology Dg Chest 2 View  Result Date: 08/12/2017 CLINICAL DATA:  Recent fluid pneumonia with lightheadedness and dizzy spells. Chemotherapy patient. EXAM: CHEST - 2 VIEW COMPARISON:   06/08/2017 FINDINGS: The heart size and mediastinal contours are within normal limits. Right port catheter tip is seen in the distal SVC. Streaky parenchymal opacities are seen at the lung bases, slightly more confluent in the area of known primary within the right lower lobe. No effusion or pneumothorax. No acute osseous appearing abnormality. Levoscoliosis of the upper lumbar spine. IMPRESSION: Streaky opacities are seen in both lower lobes similar to prior recent CT and compatible with changes of atelectasis and/or scarring. Slightly more confluent opacity noted however the right lower lobe in the area of known pulmonary primary. Electronically Signed   By: Ashley Royalty M.D.   On: 08/12/2017 15:21    Procedures Procedures (including critical care time)  Medications Ordered in ED Medications  sodium chloride 0.9 % bolus 500 mL (500 mLs Intravenous New Bag/Given 08/12/17 1600)     Initial Impression / Assessment and Plan / ED Course  I have reviewed the triage vital signs and the nursing notes.  Pertinent labs & imaging results that were available during my care of the patient were reviewed by me and considered in my medical decision making (see chart for details).  Clinical Course as of Aug 12 1720  Fri Aug 12, 2017  1722 WBC: (!) 2.9 [AH]    Clinical Course User Index [AH] Margarita Mail, PA-C   Patient workup negative for any acute abnormality.  Patient appears to have musculoskeletal back pain.  Her CT scan is unchanged from the past 2 months.  No evidence of pathologic fracture.  No evidence of pulmonary embolus.  Patient given pain medication, anti-inflammatories and muscle relaxer.  She appears appropriate for discharge at this time   Final Clinical Impressions(s) / ED Diagnoses   Final diagnoses:  Mid back pain on left side  Neutropenia, unspecified type [facility])    ED Discharge Orders        Ordered  meloxicam (MOBIC) 15 MG tablet  Daily     08/12/17 1945    baclofen  (LIORESAL) 10 MG tablet  3 times daily     08/12/17 1945       Margarita Mail, PA-C 08/13/17 0017    Davonna Belling, MD 08/13/17 0030

## 2017-08-12 NOTE — Discharge Instructions (Signed)
SEEK IMMEDIATE MEDICAL ATTENTION IF: New numbness, tingling, weakness, or problem with the use of your arms or legs.  Severe back pain not relieved with medications.  Change in bowel or bladder control.  Increasing pain in any areas of the body (such as chest or abdominal pain).  Shortness of breath, dizziness or fainting.  Nausea (feeling sick to your stomach), vomiting, fever, or sweats.  

## 2017-08-12 NOTE — ED Triage Notes (Signed)
Patient c/o left mid and lower back pain x 3 days. Patient states states she traveled from [place] in a car yesterday. Patient states she was diagnosed with the flu on 08/06/17 and was given Tamiflu and an antibiotic. Patient states she continues to have a productive cough with  Cream-colored Sputum.

## 2017-08-12 NOTE — ED Notes (Signed)
Patient transported to CT 

## 2017-08-12 NOTE — ED Notes (Signed)
Patient transported to X-ray 

## 2017-08-17 ENCOUNTER — Encounter: Payer: Self-pay | Admitting: Internal Medicine

## 2017-08-17 ENCOUNTER — Ambulatory Visit (INDEPENDENT_AMBULATORY_CARE_PROVIDER_SITE_OTHER): Payer: Medicare Other | Admitting: Internal Medicine

## 2017-08-17 VITALS — BP 126/72 | HR 88 | Temp 97.6°F | Resp 14 | Ht 69.0 in | Wt 155.2 lb

## 2017-08-17 DIAGNOSIS — J111 Influenza due to unidentified influenza virus with other respiratory manifestations: Secondary | ICD-10-CM | POA: Diagnosis not present

## 2017-08-17 DIAGNOSIS — J189 Pneumonia, unspecified organism: Secondary | ICD-10-CM | POA: Diagnosis not present

## 2017-08-17 DIAGNOSIS — D72819 Decreased white blood cell count, unspecified: Secondary | ICD-10-CM | POA: Diagnosis not present

## 2017-08-17 NOTE — Progress Notes (Signed)
Subjective:    Patient ID: Valerie Long, female    DOB: 12/30/1945, 72 y.o.   MRN: [id number]  DOS:  08/17/2017 Type of visit - description :  Interval history: Patient was admitted to a hospital in [place], diagnosed with influenza, pneumonia, prescribed Tamiflu and Avelox. As far as influenza, she is improved. She did have bilateral back pain since a diagnosis of influenza, then the pain concentrated on the L  side. She drove back from [place] and shortly after had to go to the ER on 08/12/2017 due to severe right-sided back pain. ER note reviewed: Chest x-ray: Changes consistent with atelectasis or scarring however slightly more confluent opacities noted at the RLL Creatinine 1.1, UA negative, WBC 2.9, hemoglobin 10.4 at baseline CT angiogram: No PE, evidence of treated lung cancer. CT thoracic spine: No acute, evidence of metastatic lung cancer noted.  Review of Systems   Currently with no fever chills. Back pain much improved, still taking meloxicam and baclofen. She has mild cough with yellow sputum production.  No hemoptysis Very mild chest congestion No nausea, vomiting, diarrhea.  No change in the color of the stools.  Past Medical History:  Diagnosis Date  . Adenocarcinoma of right lung, stage 4 ([facility]) 2017  . Anemia   . Arthritis   . Bone metastases ([facility]) 02/24/2017  . Encounter for antineoplastic chemotherapy 03/17/2016  . Hypercholesterolemia   . Hypertension 06/18/2016  . Osteopenia   . 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 Pneumonia   . Shortness of breath dyspnea     Past Surgical History:  Procedure Laterality Date  . CESAREAN SECTION     myomectomy  . COLONOSCOPY W/ POLYPECTOMY    . IR GENERIC HISTORICAL  08/17/2016   IR US GUIDE VASC ACCESS RIGHT 08/17/2016 [name], MD [facility]  . IR GENERIC HISTORICAL  08/17/2016   IR FLUORO GUIDE PORT INSERTION RIGHT 08/17/2016 [name], MD [facility]  . MEDIASTINOSCOPY  N/A 09/25/2015   Procedure: MEDIASTINOSCOPY;  Surgeon: Melrose Nakayama, MD;  Location: [facility];  Service: Thoracic;  Laterality: N/A;  . VIDEO BRONCHOSCOPY Bilateral 08/19/2015   Procedure: VIDEO BRONCHOSCOPY WITH FLUORO;  Surgeon: Rigoberto Noel, MD;  Location: [facility];  Service: Cardiopulmonary;  Laterality: Bilateral;  . VIDEO BRONCHOSCOPY N/A 09/08/2015   Procedure: VIDEO BRONCHOSCOPY WITH FLUORO;  Surgeon: Rigoberto Noel, MD;  Location: [facility];  Service: Thoracic;  Laterality: N/A;  . VIDEO BRONCHOSCOPY WITH ENDOBRONCHIAL ULTRASOUND Right 09/08/2015   Procedure: ATTEMPTED VIDEO BRONCHOSCOPY ENDOBRONCHIAL ULTRASOUND  ;  Surgeon: Rigoberto Noel, MD;  Location: [facility];  Service: Thoracic;  Laterality: Right;    Social History   Socioeconomic History  . Marital status: Legally Separated    Spouse name: Not on file  . Number of children: 1  . Years of education: Not on file  . Highest education level: Not on file  Social Needs  . Financial resource strain: Not on file  . Food insecurity - worry: Not on file  . Food insecurity - inability: Not on file  . Transportation needs - medical: Not on file  . Transportation needs - non-medical: Not on file  Occupational History  . Occupation: retired     Fish farm manager: SELF EMPLOYED  Tobacco Use  . Smoking status: Never Smoker  . Smokeless tobacco: Never Used  Substance and Sexual Activity  . Alcohol use: Yes    Alcohol/week: 0.0 oz  Comment: SOCIAL  . Drug use: No  . Sexual activity: No  Other Topics Concern  . Not on file  Social History Narrative   From [place], moved from [place]  to [place] 2008   Lives w/ her mother and her son       Allergies as of 08/17/2017   No Known Allergies     Medication List        Accurate as of 08/17/17 11:59 PM. Always use your most recent med list.          acetaminophen 500 MG tablet Commonly known as:  TYLENOL Take 1,000 mg by mouth every 6 (six) hours as needed for moderate pain or  headache.   B-12 PO Take 2 tablets by mouth daily.   baclofen 10 MG tablet Commonly known as:  LIORESAL Take 1 tablet (10 mg total) by mouth 3 (three) times daily.   CALCIUM 600/VITAMIN D3 PO Take by mouth.   dexamethasone 4 MG tablet Commonly known as:  DECADRON Take 4mg  by mouth twice daily the day before, of, and after chemo   folic acid 1 MG tablet Commonly known as:  FOLVITE Take 1 tablet (1 mg total) by mouth daily.   ketorolac 0.5 % ophthalmic solution Commonly known as:  ACULAR Place 1 drop into both eyes 4 (four) times daily.   lidocaine-prilocaine cream Commonly known as:  EMLA Apply 1 application topically as needed. Apply 1-2 tsp over port site 1-2 hours prior to chemo.   meloxicam 15 MG tablet Commonly known as:  MOBIC Take 1 tablet (15 mg total) by mouth daily.   ofloxacin 0.3 % ophthalmic solution Commonly known as:  OCUFLOX Place 1 drop into both eyes 4 (four) times daily.   OMEGA-3 + VITAMIN D3 PO Take 1 tablet by mouth daily.   ondansetron 8 MG tablet Commonly known as:  ZOFRAN Take 8 mg by mouth every 8 (eight) hours as needed.   prednisoLONE acetate 1 % ophthalmic suspension Commonly known as:  PRED FORTE Place 1 drop into both eyes 4 (four) times daily.   PRESCRIPTION MEDICATION Inject 1 Dose as directed every 21 ( twenty-one) days. Chemo          Objective:   Physical Exam  Musculoskeletal:       Arms:  BP 126/72 (BP Location: Left Arm, Patient Position: Sitting, Cuff Size: Small)   Pulse 88   Temp 97.6 F (36.4 C) (Oral)   Resp 14   Ht 5\' 9"  (1.753 m)   Wt 155 lb 4 oz (70.4 kg)   LMP 06/09/1998   SpO2 96%   BMI 22.93 kg/m  General:   Well developed, well nourished . NAD.  HEENT:  Normocephalic . Face symmetric, atraumatic Lungs:  CTA B Normal respiratory effort, no intercostal retractions, no accessory muscle use. Heart: RRR,  no murmur.  no pretibial edema bilaterally  Abdomen:  Not distended, soft, non-tender. No  rebound or rigidity.   Skin: Not pale. Not jaundice Neurologic:  alert & oriented X3.  Speech normal, gait appropriate for age and unassisted Psych--  Cognition and judgment appear intact.  Cooperative with normal attention span and concentration.  Behavior appropriate. No anxious or depressed appearing.     Assessment & Plan:  Assessment  Hyperlipidemia Osteopenia Left pelvic kidney -- by CT done at [place]  Abnormal CT chest, very suspicious for  Lung Ca, scheduled mediastinoscopy 09/25/2015 Non-small cell lung cancer-Stage IV (T1b, N2, M1b) . Dx  March  2017  PLAN: Influenza, pneumonia: Dx and treated in [place], status post Tamiflu and Avelox.  Clinically resolved. Thoracic back pain: Workup at the ER showed no acute problems, she is currently on meloxicam and baclofen with much improvement.  Probably was a MSK issue.  Rec to change meloxicam and baclofen PRN  GI precautions discussed. Leukopenia: WBC is at the ER show a very low white cell count, patient is a schedule for chemotherapy tomorrow, according to the patient the will do blood work ahead of time thus will not check her blood today. RTC 4 months, CPX

## 2017-08-17 NOTE — Patient Instructions (Addendum)
   GO TO THE FRONT DESK Schedule your next appointment for a  Physical in 4 months   Take meloxicam with food if needed  Take baclofen (a muscle relaxant)  if needed, take at night, watch for drowsiness

## 2017-08-17 NOTE — Progress Notes (Signed)
Pre visit review using our clinic review tool, if applicable. No additional management support is needed unless otherwise documented below in the visit note. 

## 2017-08-18 ENCOUNTER — Inpatient Hospital Stay (HOSPITAL_BASED_OUTPATIENT_CLINIC_OR_DEPARTMENT_OTHER): Payer: Medicare Other | Admitting: Oncology

## 2017-08-18 ENCOUNTER — Inpatient Hospital Stay: Payer: Medicare Other | Attending: Internal Medicine

## 2017-08-18 ENCOUNTER — Inpatient Hospital Stay: Payer: Medicare Other

## 2017-08-18 ENCOUNTER — Telehealth: Payer: Self-pay

## 2017-08-18 ENCOUNTER — Encounter: Payer: Self-pay | Admitting: Oncology

## 2017-08-18 VITALS — BP 137/78 | HR 88 | Temp 98.5°F | Resp 20 | Ht 69.0 in | Wt 156.1 lb

## 2017-08-18 DIAGNOSIS — C3491 Malignant neoplasm of unspecified part of right bronchus or lung: Secondary | ICD-10-CM

## 2017-08-18 DIAGNOSIS — Z5111 Encounter for antineoplastic chemotherapy: Secondary | ICD-10-CM | POA: Insufficient documentation

## 2017-08-18 DIAGNOSIS — C3431 Malignant neoplasm of lower lobe, right bronchus or lung: Secondary | ICD-10-CM | POA: Diagnosis not present

## 2017-08-18 DIAGNOSIS — C7951 Secondary malignant neoplasm of bone: Secondary | ICD-10-CM | POA: Diagnosis not present

## 2017-08-18 LAB — COMPREHENSIVE METABOLIC PANEL
ALT: 20 U/L (ref 0–55)
AST: 32 U/L (ref 5–34)
Albumin: 3.4 g/dL — ABNORMAL LOW (ref 3.5–5.0)
Alkaline Phosphatase: 61 U/L (ref 40–150)
Anion gap: 8 (ref 3–11)
BUN: 19 mg/dL (ref 7–26)
CO2: 25 mmol/L (ref 22–29)
Calcium: 10 mg/dL (ref 8.4–10.4)
Chloride: 106 mmol/L (ref 98–109)
Creatinine, Ser: 1.13 mg/dL — ABNORMAL HIGH (ref 0.60–1.10)
GFR calc Af Amer: 56 mL/min — ABNORMAL LOW (ref >60)
GFR calc non Af Amer: 48 mL/min — ABNORMAL LOW (ref >60)
Glucose, Bld: 164 mg/dL — ABNORMAL HIGH (ref 70–140)
Potassium: 3.7 mmol/L (ref 3.5–5.1)
Sodium: 139 mmol/L (ref 136–145)
Total Bilirubin: 0.6 mg/dL (ref 0.2–1.2)
Total Protein: 7 g/dL (ref 6.4–8.3)

## 2017-08-18 LAB — CBC WITH DIFFERENTIAL/PLATELET
Basophils Absolute: 0 10*3/uL (ref 0.0–0.1)
Basophils Relative: 0 %
Eosinophils Absolute: 0 10*3/uL (ref 0.0–0.5)
Eosinophils Relative: 0 %
HCT: 30.9 % — ABNORMAL LOW (ref 34.8–46.6)
Hemoglobin: 10.2 g/dL — ABNORMAL LOW (ref 11.6–15.9)
Lymphocytes Relative: 9 %
Lymphs Abs: 0.5 10*3/uL — ABNORMAL LOW (ref 0.9–3.3)
MCH: 32.8 pg (ref 25.1–34.0)
MCHC: 33 g/dL (ref 31.5–36.0)
MCV: 99.4 fL (ref 79.5–101.0)
Monocytes Absolute: 0.4 10*3/uL (ref 0.1–0.9)
Monocytes Relative: 6 %
Neutro Abs: 5 10*3/uL (ref 1.5–6.5)
Neutrophils Relative %: 85 %
Platelets: 228 10*3/uL (ref 145–400)
RBC: 3.11 MIL/uL — ABNORMAL LOW (ref 3.70–5.45)
RDW: 14.7 % — ABNORMAL HIGH (ref 11.2–14.5)
WBC: 5.9 10*3/uL (ref 3.9–10.3)

## 2017-08-18 IMAGING — MR MR HEAD WO/W CM
9 of 13 series · 35 of 48 positions shown · IV contrast (Yes)
Comparison: None.

CLINICAL DATA: 70-year-old female with stage IV lung cancer.
Staging exam. Initial encounter.

EXAM:
MRI HEAD WITHOUT AND WITH CONTRAST
TECHNIQUE: Multiplanar, multiecho pulse sequences of the brain and surrounding
structures were obtained without and with intravenous contrast.
CONTRAST:  14mL MULTIHANCE GADOBENATE DIMEGLUMINE 529 MG/ML IV SOLN

[Series 4: DWI · axial · 3.0mm · 1.09mm/px · z∈[-30,+116]mm · 9 of 100 slices shown (1 of 4)]
[im 1/100]
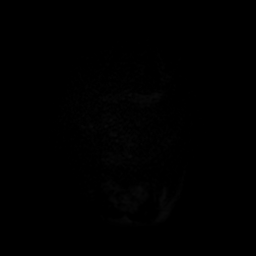
[im 13/100]
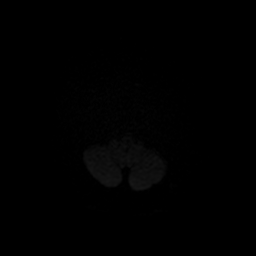
[im 25/100]
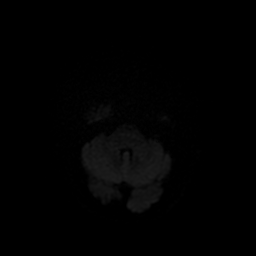
[im 38/100]
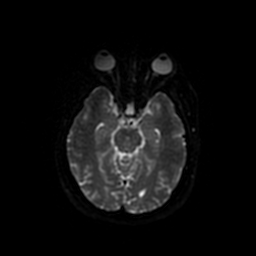
[im 50/100]
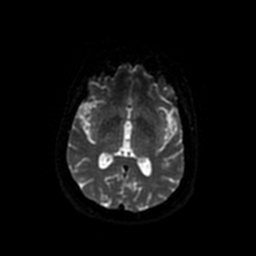
[im 62/100]
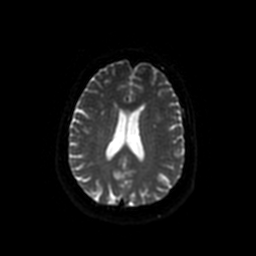
[im 75/100]
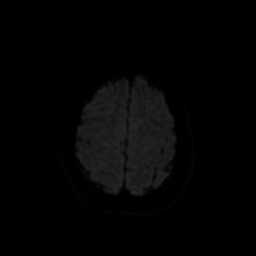
[im 87/100]
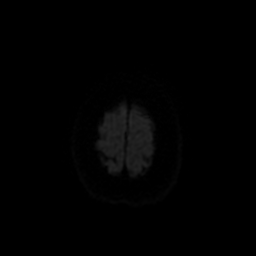
[im 100/100]
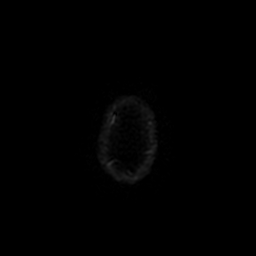

[Series 5: DWI · coronal · 5.0mm · 1.09mm/px · 7 of 76 slices shown (2 of 4)]
[im 1/76]
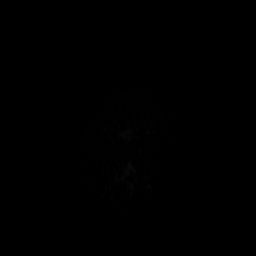
[im 13/76]
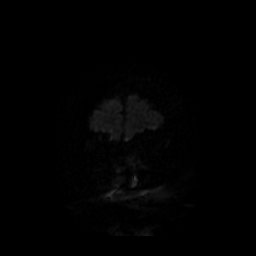
[im 26/76]
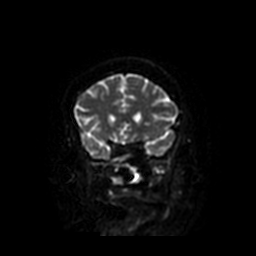
[im 38/76]
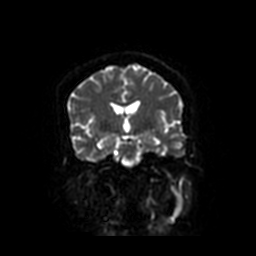
[im 51/76]
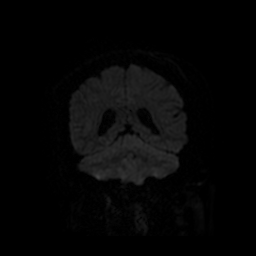
[im 63/76]
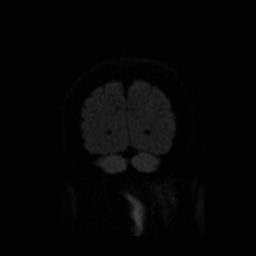
[im 76/76]
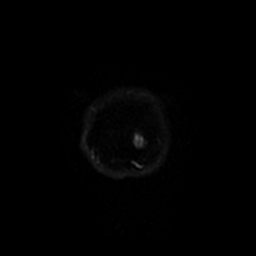

[Series 6: T2 · axial · 5.0mm · 0.43mm/px · z∈[-26,+121]mm · 2 of 24 slices shown]
[im 1/24]
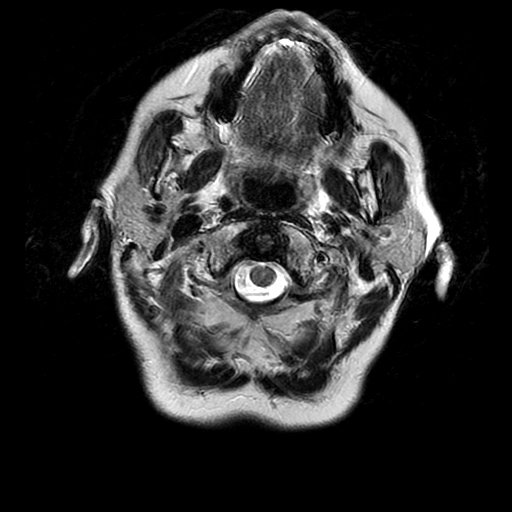
[im 24/24]
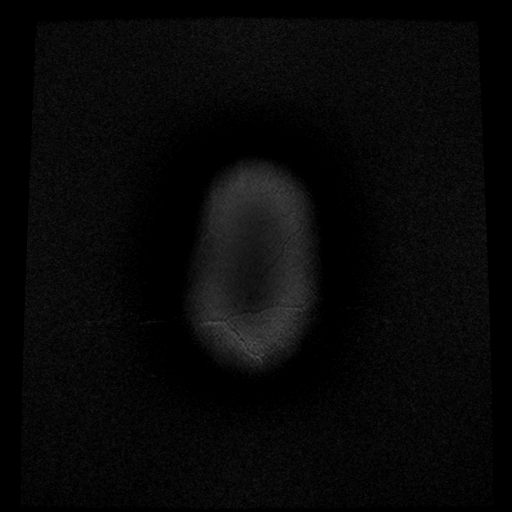

[Series 7: FLAIR · axial · 5.0mm · 0.43mm/px · z∈[-32,+127]mm · 2 of 24 slices shown]
[im 1/24]
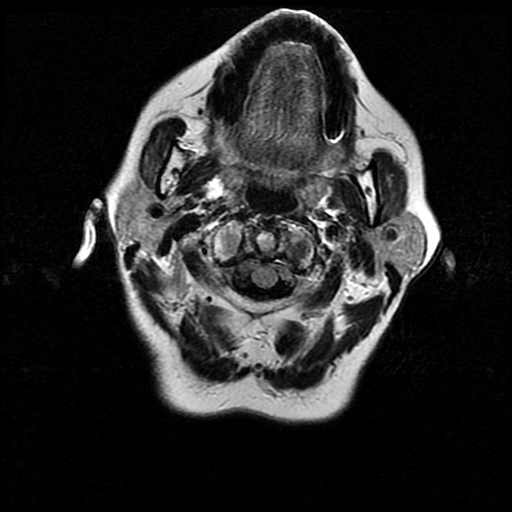
[im 24/24]
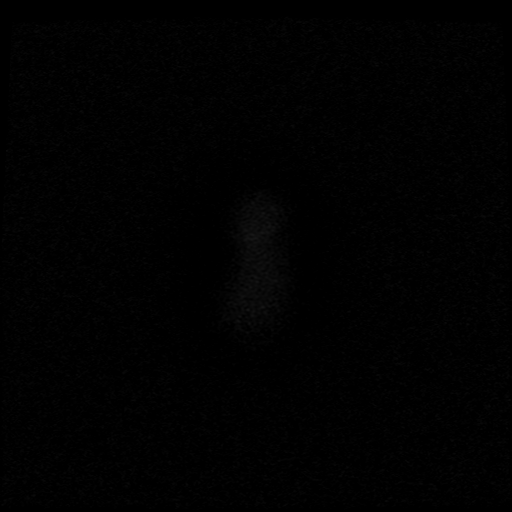

[Series 10: T2 post-contrast · coronal · 5.0mm · 0.45mm/px · 3 of 29 slices shown]
[im 1/29]
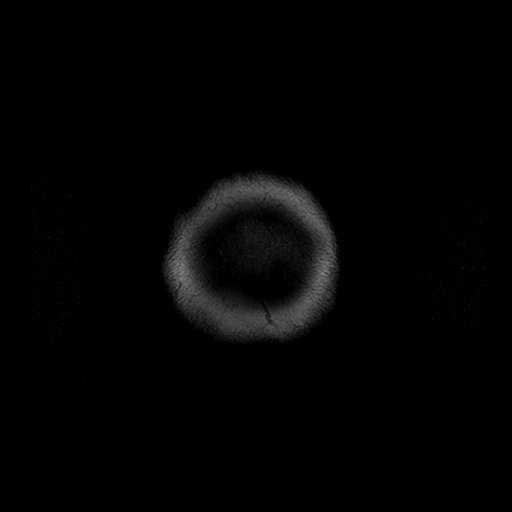
[im 15/29]
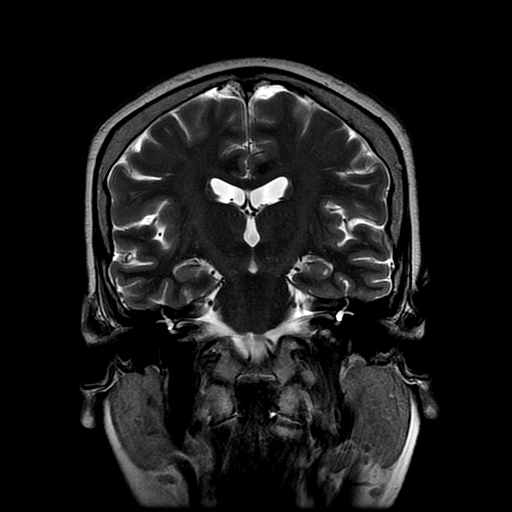
[im 29/29]
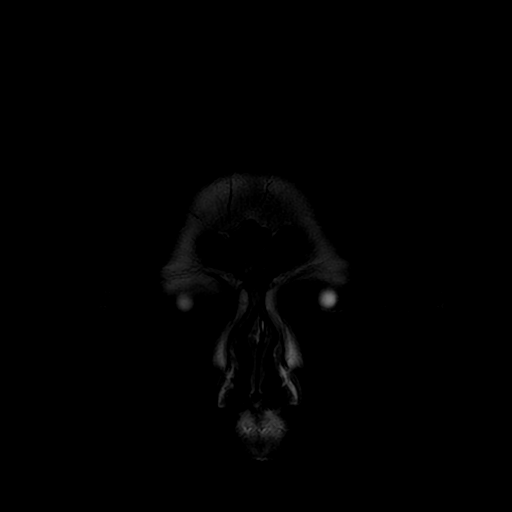

[Series 12: T1 post-contrast · coronal · 5.0mm · 0.45mm/px · 3 of 29 slices shown (1 of 2)]
[im 1/29]
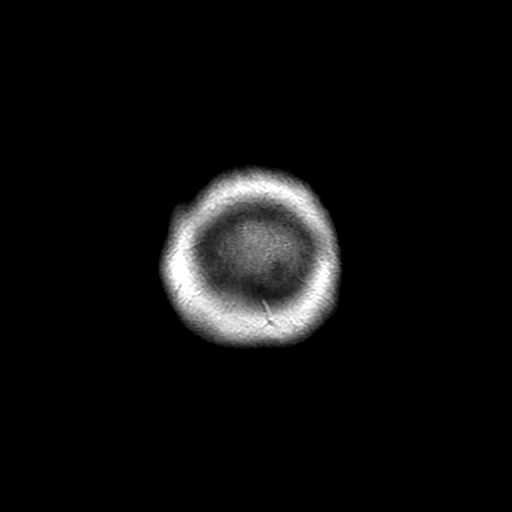
[im 15/29]
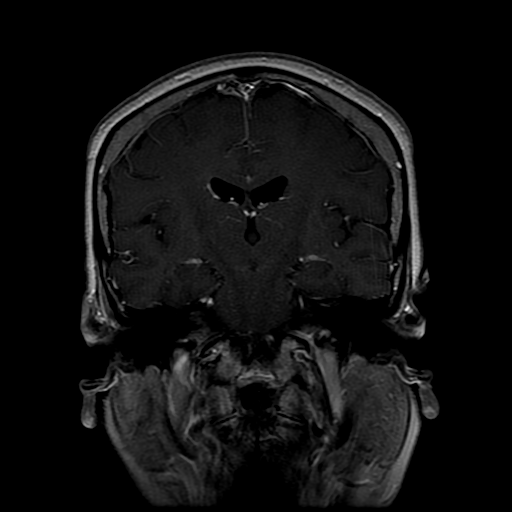
[im 29/29]
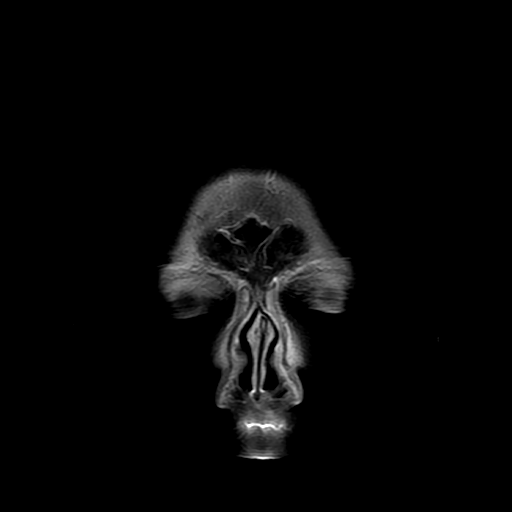

[Series 13: T1 post-contrast · sagittal · 5.0mm · 0.47mm/px · 2 of 24 slices shown (2 of 2)]
[im 1/24]
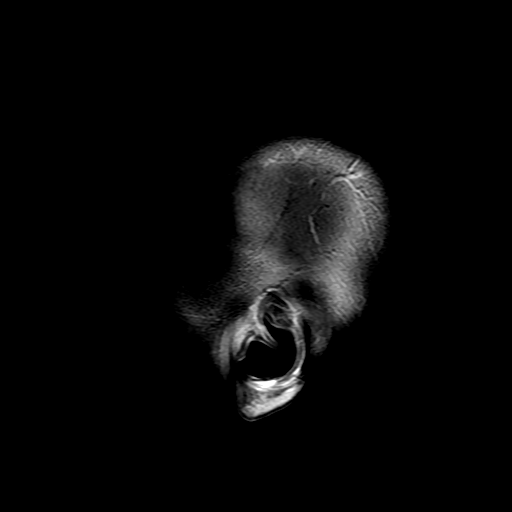
[im 24/24]
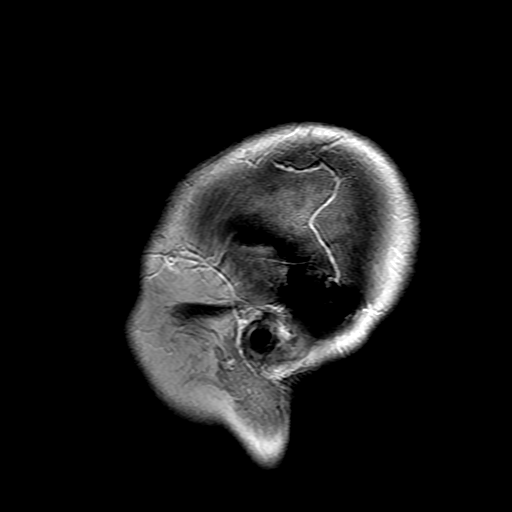

[Series 400: DWI · axial · 3.0mm · 1.09mm/px · z∈[-30,+116]mm · 4 of 50 slices shown (3 of 4)]
[im 1/50]
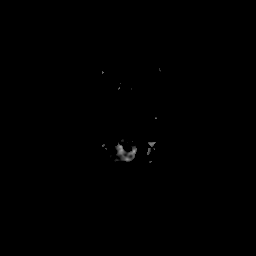
[im 17/50]
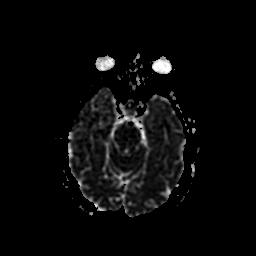
[im 33/50]
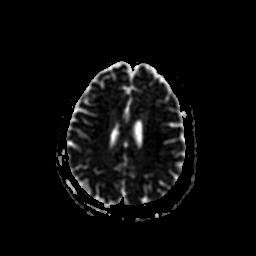
[im 50/50]
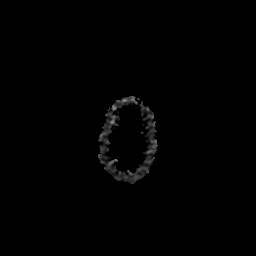

[Series 500: DWI · coronal · 5.0mm · 1.09mm/px · 3 of 38 slices shown (4 of 4)]
[im 1/38]
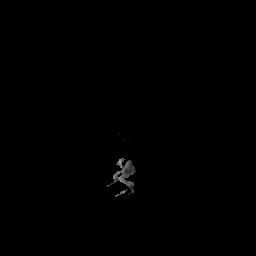
[im 19/38]
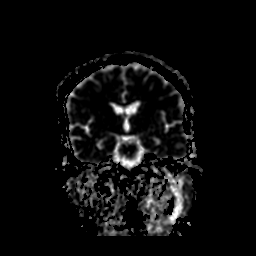
[im 38/38]
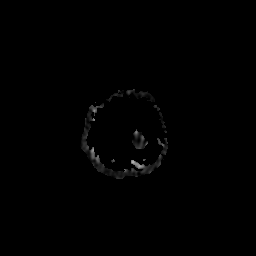

[35 of 48 positions shown; findings below may reference images not displayed]

FINDINGS: No acute infarct or intracranial hemorrhage.

No intracranial mass or abnormal enhancement.

Several punctate/patchy white matter changes most notable frontal
lobes which probably reflects result of small vessel disease.

No age advanced atrophy or hydrocephalus.

Major intracranial vascular structures are patent with left
vertebral artery dominant in size.

Paranasal sinus mucosal thickening most notable right sphenoid
sinus.

No orbital abnormality noted.

Slightly small pituitary gland.

Cerebellar tonsils slightly low lying but within the range of normal
limits.
IMPRESSION: No evidence of intracranial metastatic disease.

Mild small vessel disease changes.

Paranasal sinus mucosal thickening most notable right sphenoid
sinus.

## 2017-08-18 MED ORDER — SODIUM CHLORIDE 0.9 % IV SOLN
525.0000 mg/m2 | Freq: Once | INTRAVENOUS | Status: AC
  Administered 2017-08-18: 1000 mg via INTRAVENOUS
  Filled 2017-08-18: qty 40

## 2017-08-18 MED ORDER — PROCHLORPERAZINE MALEATE 10 MG PO TABS
ORAL_TABLET | ORAL | Status: AC
  Filled 2017-08-18: qty 1

## 2017-08-18 MED ORDER — CYANOCOBALAMIN 1000 MCG/ML IJ SOLN
1000.0000 ug | Freq: Once | INTRAMUSCULAR | Status: AC
  Administered 2017-08-18: 1000 ug via INTRAMUSCULAR

## 2017-08-18 MED ORDER — SODIUM CHLORIDE 0.9 % IV SOLN
Freq: Once | INTRAVENOUS | Status: AC
  Administered 2017-08-18: 12:00:00 via INTRAVENOUS

## 2017-08-18 MED ORDER — PROCHLORPERAZINE MALEATE 10 MG PO TABS
10.0000 mg | ORAL_TABLET | Freq: Once | ORAL | Status: AC
  Administered 2017-08-18: 10 mg via ORAL

## 2017-08-18 MED ORDER — CYANOCOBALAMIN 1000 MCG/ML IJ SOLN
INTRAMUSCULAR | Status: AC
  Filled 2017-08-18: qty 1

## 2017-08-18 MED ORDER — SODIUM CHLORIDE 0.9% FLUSH
10.0000 mL | INTRAVENOUS | Status: DC | PRN
  Administered 2017-08-18: 10 mL
  Filled 2017-08-18: qty 10

## 2017-08-18 MED ORDER — HEPARIN SOD (PORK) LOCK FLUSH 100 UNIT/ML IV SOLN
500.0000 [IU] | Freq: Once | INTRAVENOUS | Status: AC | PRN
  Administered 2017-08-18: 500 [IU]
  Filled 2017-08-18: qty 5

## 2017-08-18 NOTE — Assessment & Plan Note (Signed)
Influenza, pneumonia: Dx and treated in [place], status post Tamiflu and Avelox.  Clinically resolved. Thoracic back pain: Workup at the ER showed no acute problems, she is currently on meloxicam and baclofen with much improvement.  Probably was a MSK issue.  Rec to change meloxicam and baclofen PRN  GI precautions discussed. Leukopenia: WBC is at the ER show a very low white cell count, patient is a schedule for chemotherapy tomorrow, according to the patient the will do blood work ahead of time thus will not check her blood today. RTC 4 months, CPX

## 2017-08-18 NOTE — Progress Notes (Signed)
[place], MD [address] [place] [address]  DIAGNOSIS: Stage IV (T1b, N2, M1b) non-small cell lung cancer, adenocarcinoma diagnosed in March 2017 and presented with right lower lobe lung nodule in addition to mediastinal lymphadenopathy and metastatic bone lesions.  Molecular studies:PDL 1 TPS <1%.   [facility] Studies: Positive for ERBB2 [id number]. Negative for EGFR, KRAS, ALK, BRAF, MET, RET and ROS1.  PRIOR THERAPY: 1) Induction systemic chemotherapy with carboplatin for AUC of 5 and Alimta 500 MG/M2 every 3 weeks is status post 6 cycles at [facility], last dose was given 03/05/2016 with stable disease. 2) palliative radiation to [facility] metastatic bone disease in [facility] lower back and pelvic area.  CURRENT THERAPY: 1) Maintenance systemic chemotherapy with Alimta 500 MG/M2 every 3 weeks status post 24cycles. First dose was given 03/25/2016.  2) Zometa 4 mg IV every 12 week for metastatic bone disease.  INTERVAL HISTORY: [facility] Long 72 y.o. Long returns for routine follow-up visit.  [facility] patient was hospitalized while she was visiting [place] for influenza and pneumonia.  [facility] patient was also seen in [facility] emergency room for back pain.  Imaging did not show any concerning findings.  She was started on meloxicam and a muscle relaxer with improvement of her symptoms.  She is now taking these medications on a as needed basis only.  She completed a course of Tamiflu and Avelox and is feeling much better.  [facility] patient denies fevers and chills.  Denies chest pain, shortness breath, cough, hemoptysis.  Denies nausea, vomiting, constipation, diarrhea.  [facility] patient had a restaging CT scan of [facility] chest and is here to discuss [facility] results.  MEDICAL HISTORY: Past Medical History:  Diagnosis Date  . Adenocarcinoma of right lung, stage 4 ([facility]) 2017  . Anemia   . Arthritis   . Bone metastases ([facility])  02/24/2017  . Encounter for antineoplastic chemotherapy 03/17/2016  . Hypercholesterolemia   . Hypertension 06/18/2016  . Osteopenia   . 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 Pneumonia   . Shortness of breath dyspnea     ALLERGIES:  has No Known Allergies.  MEDICATIONS:  Current Outpatient Medications  Medication Sig Dispense Refill  . acetaminophen (TYLENOL) 500 MG tablet Take 1,000 mg by mouth every 6 (six) hours as needed for moderate pain or headache.    . Calcium Carb-Cholecalciferol (CALCIUM 600/VITAMIN D3 PO) Take by mouth.    . Cyanocobalamin (B-12 PO) Take 2 tablets by mouth daily.    Marland Kitchen dexamethasone (DECADRON) 4 MG tablet Take 76m by mouth twice daily [facility] day before, of, and after chemo 40 tablet 1  . Fish Oil-Cholecalciferol (OMEGA-3 + VITAMIN D3 PO) Take 1 tablet by mouth daily.    . folic acid (FOLVITE) 1 MG tablet Take 1 tablet (1 mg total) by mouth daily. 90 tablet 1  . ketorolac (ACULAR) 0.5 % ophthalmic solution Place 1 drop into both eyes 4 (four) times daily.  0  . lidocaine-prilocaine (EMLA) cream Apply 1 application topically as needed. Apply 1-2 tsp over port site 1-2 hours prior to chemo. 30 g 0  . ofloxacin (OCUFLOX) 0.3 % ophthalmic solution Place 1 drop into both eyes 4 (four) times daily.  0  . ondansetron (ZOFRAN) 8 MG tablet Take 8 mg by mouth every 8 (eight) hours as needed.    . prednisoLONE acetate (PRED FORTE) 1 % ophthalmic  suspension Place 1 drop into both eyes 4 (four) times daily.  0  . PRESCRIPTION MEDICATION Inject 1 Dose as directed every 21 ( twenty-one) days. Chemo    . baclofen (LIORESAL) 10 MG tablet Take 1 tablet (10 mg total) by mouth 3 (three) times daily. (Patient not taking: Reported on 08/18/2017) 30 each 0  . meloxicam (MOBIC) 15 MG tablet Take 1 tablet (15 mg total) by mouth daily. (Patient not taking: Reported on 08/18/2017) 30 tablet 0   No current facility-administered medications for this visit.     Facility-Administered Medications Ordered in Other Visits  Medication Dose Route Frequency Provider Last Rate Last Dose  . sodium chloride flush (NS) 0.9 % injection 10 mL  10 mL Intracatheter PRN Curt Bears, MD   10 mL at 08/18/17 1307    SURGICAL HISTORY:  Past Surgical History:  Procedure Laterality Date  . CESAREAN SECTION     myomectomy  . COLONOSCOPY W/ POLYPECTOMY    . IR GENERIC HISTORICAL  08/17/2016   IR US GUIDE VASC ACCESS RIGHT 08/17/2016 [name], MD [facility]  . IR GENERIC HISTORICAL  08/17/2016   IR FLUORO GUIDE PORT INSERTION RIGHT 08/17/2016 [name], MD [facility]  . MEDIASTINOSCOPY N/A 09/25/2015   Procedure: MEDIASTINOSCOPY;  Surgeon: Melrose Nakayama, MD;  Location: [facility];  Service: Thoracic;  Laterality: N/A;  . VIDEO BRONCHOSCOPY Bilateral 08/19/2015   Procedure: VIDEO BRONCHOSCOPY WITH FLUORO;  Surgeon: Rigoberto Noel, MD;  Location: [facility];  Service: Cardiopulmonary;  Laterality: Bilateral;  . VIDEO BRONCHOSCOPY N/A 09/08/2015   Procedure: VIDEO BRONCHOSCOPY WITH FLUORO;  Surgeon: Rigoberto Noel, MD;  Location: [facility];  Service: Thoracic;  Laterality: N/A;  . VIDEO BRONCHOSCOPY WITH ENDOBRONCHIAL ULTRASOUND Right 09/08/2015   Procedure: ATTEMPTED VIDEO BRONCHOSCOPY ENDOBRONCHIAL ULTRASOUND  ;  Surgeon: Rigoberto Noel, MD;  Location: [facility];  Service: Thoracic;  Laterality: Right;    REVIEW OF SYSTEMS:   Review of Systems  Constitutional: Negative for appetite change, chills, fatigue, fever and unexpected weight change.  HENT:   Negative for mouth sores, nosebleeds, sore throat and trouble swallowing.   Eyes: Negative for eye problems and icterus.  Respiratory: Negative for cough, hemoptysis, shortness of breath and wheezing.   Cardiovascular: Negative for chest pain and leg swelling.  Gastrointestinal: Negative for abdominal pain, constipation, diarrhea, nausea and vomiting.  Genitourinary: Negative for bladder incontinence,  difficulty urinating, dysuria, frequency and hematuria.   Musculoskeletal: Negative for back pain, gait problem, neck pain and neck stiffness.  Skin: Negative for itching and rash.  Neurological: Negative for dizziness, extremity weakness, gait problem, headaches, light-headedness and seizures.  Hematological: Negative for adenopathy. Does not bruise/bleed easily.  Psychiatric/Behavioral: Negative for confusion, depression and sleep disturbance. [facility] patient is not nervous/anxious.     PHYSICAL EXAMINATION:  Blood pressure 137/78, pulse 88, temperature 98.5 F (36.9 C), temperature source Oral, resp. rate 20, height '5\' 9"'  (1.753 m), weight 156 lb 1.6 oz (70.8 kg), last menstrual period 06/09/1998, SpO2 100 %.  ECOG PERFORMANCE STATUS: 1 - Symptomatic but completely ambulatory  Physical Exam  Constitutional: Oriented to person, place, and time and well-developed, well-nourished, and in no distress. No distress.  HENT:  Head: Normocephalic and atraumatic.  Mouth/Throat: Oropharynx is clear and moist. No oropharyngeal exudate.  Eyes: Conjunctivae are normal. Right eye exhibits no discharge. Left eye exhibits no discharge. No scleral icterus.  Neck: Normal range of motion. Neck supple.  Cardiovascular: Normal rate, regular rhythm, normal heart sounds  and intact distal pulses.   Pulmonary/Chest: Effort normal and breath sounds normal. No respiratory distress. No wheezes. No rales.  Abdominal: Soft. Bowel sounds are normal. Exhibits no distension and no mass. There is no tenderness.  Musculoskeletal: Normal range of motion. Exhibits no edema.  Lymphadenopathy:    No cervical adenopathy.  Neurological: Alert and oriented to person, place, and time. Exhibits normal muscle tone. Gait normal. Coordination normal.  Skin: Skin is warm and dry. No rash noted. Not diaphoretic. No erythema. No pallor.  Psychiatric: Mood, memory and judgment normal.  Vitals reviewed.  LABORATORY DATA: Lab Results   Component Value Date   WBC 5.9 08/18/2017   HGB 10.2 (L) 08/18/2017   HCT 30.9 (L) 08/18/2017   MCV 99.4 08/18/2017   PLT 228 08/18/2017      Chemistry      Component Value Date/Time   NA 139 08/18/2017 1000   NA 137 06/09/2017 0901   K 3.7 08/18/2017 1000   K 3.9 06/09/2017 0901   CL 106 08/18/2017 1000   CO2 25 08/18/2017 1000   CO2 25 06/09/2017 0901   BUN 19 08/18/2017 1000   BUN 19.0 06/09/2017 0901   CREATININE 1.13 (H) 08/18/2017 1000   CREATININE 1.2 (H) 06/09/2017 0901   GLU 170 01/23/2016      Component Value Date/Time   CALCIUM 10.0 08/18/2017 1000   CALCIUM 9.9 06/09/2017 0901   ALKPHOS 61 08/18/2017 1000   ALKPHOS 63 06/09/2017 0901   AST 32 08/18/2017 1000   AST 35 (H) 06/09/2017 0901   ALT 20 08/18/2017 1000   ALT 23 06/09/2017 0901   BILITOT 0.6 08/18/2017 1000   BILITOT 0.65 06/09/2017 0901       RADIOGRAPHIC STUDIES:  Dg Chest 2 View  Result Date: 08/12/2017 CLINICAL DATA:  Recent fluid pneumonia with lightheadedness and dizzy spells. Chemotherapy patient. EXAM: CHEST - 2 VIEW COMPARISON:  06/08/2017 FINDINGS: [facility]erie heart size and mediastinal contours are within normal limits. Right port catheter tip is seen in [facility] distal SVC. Streaky parenchymal opacities are seen at [facility] lung bases, slightly more confluent in [facility]erie area of known primary within Valerie right lower lobe. No effusion or pneumothorax. No acute osseous appearing abnormality. Levoscoliosis of [facility]erie upper lumbar spine. IMPRESSION: Streaky opacities are seen in both lower lobes similar to prior recent CT and compatible with changes of atelectasis and/or scarring. Slightly more confluent opacity noted however [facility]erie right lower lobe in [facility]erie area of known pulmonary primary. Electronically Signed   By: Ashley Royalty M.D.   On: 08/12/2017 15:21   Ct Angio Chest Pe W And/or Wo Contrast  Result Date: 08/12/2017 CLINICAL DATA:  Pulmonary embolism, high pretest probability. Diagnosed with flu 08/06/2017 EXAM: CT  ANGIOGRAPHY CHEST WITH CONTRAST TECHNIQUE: Multidetector CT imaging of [facility]erie chest was performed using [facility] standard protocol during bolus administration of intravenous contrast. Multiplanar CT image reconstructions and MIPs were obtained to evaluate Valerie vascular anatomy. CONTRAST:  143m ISOVUE-370 IOPAMIDOL (ISOVUE-370) INJECTION 76% COMPARISON:  06/08/2017 FINDINGS: Cardiovascular: Satisfactory opacification of Valerie pulmonary arteries to [facility]erie segmental level. When allowing for motion at Valerie bases there is no evidence of pulmonary embolism. Mild cardiomegaly. No pericardial effusion. Porta catheter from [facility] right with tip at [facility] SVC. Mediastinum/Nodes: Negative for adenopathy. Lungs/Pleura: History of treated right lower lobe lung cancer with spiculated and bandlike density in Valerie right lower lobe where there is motion artifact limiting comparison with prior. Bilateral pulmonary nodules with Valerie largest marked on series 11.  No convincing change in nodule size. Upper Abdomen: No acute finding. Cholelithiasis without findings of cholecystitis. Ptotic left kidney. Musculoskeletal: Sclerotic osseous metastases. Review of [facility]erie MIP images confirms Valerie above findings. IMPRESSION: 1. Motion degraded exam that is negative for pulmonary embolism. 2. Treated metastatic lung cancer without noted change since 06/08/2017 staging scan. Electronically Signed   By: Monte Fantasia M.D.   On: 08/12/2017 17:46   Ct T-spine No Charge  Result Date: 08/12/2017 CLINICAL DATA:  Mid lower back pain.  Cough. EXAM: CT THORACIC SPINE WITHOUT CONTRAST TECHNIQUE: Multidetector CT images of [facility]erie thoracic were obtained using [facility] standard protocol without intravenous contrast. COMPARISON:  Chest CT dated 06/08/2017 FINDINGS: Alignment: Normal. Vertebrae: There are numerous blastic metastases throughout Valerie thoracic spine. No lytic lesions. No fractures. Paraspinal and other soft tissues: Bilateral pulmonary nodules. Disc levels: T7-8: Degenerative  changes of Valerie vertebral endplates. No disc protrusion. T8-9: There is a small calcified disc protrusion into Valerie left lateral recess. There is no visible tumor in Valerie spinal canal. No spinal or foraminal stenosis or significant facet arthritis. Slight right facet arthritis at T4-5. IMPRESSION: 1. No acute abnormality of [facility] thoracic spine. 2. Numerous blastic metastases in [facility] thoracic spine, unchanged. 3. No visible tumor in Valerie spinal canal. 4. Pulmonary metastases. 5. Small calcified disc protrusion at T8-9 to Valerie left of midline. Electronically Signed   By: Lorriane Shire M.D.   On: 08/12/2017 17:44     ASSESSMENT/PLAN:  Adenocarcinoma of right lung, stage 4 ([facility]) This is a very pleasant 72 year old Hispanic Long with metastatic non-small cell lung cancer, adenocarcinoma status post induction systemic chemotherapy with carboplatin and Alimta and she is currently on maintenance treatment with single agent Alimta status post 24cycles. [facility] patient continues to tolerate her maintenance treatment fairly well with no concerning complaints. She was recently treated for influenza and pneumonia and has now recovered. She had a restaging CT scan of [facility] chest and is here to discuss [facility]erie results.  Patient was seen with Dr. Julien Nordmann.  CT scan of Valerie chest does not show any evidence of disease progression.  Recommend that she continue on maintenance Alimta.  She will proceed with cycle #25 as scheduled today.  She will continue to receive Zometa every 12 weeks.  [facility] patient will follow-up in 3 weeks for evaluation prior to cycle #26 of her treatment.  [facility] patient was advised to call immediately if she has any concerning symptoms in [facility]erie interval. [facility] patient voices understanding of current disease status and treatment options and is in agreement with [facility] current care plan. All questions were answered. [facility] patient knows to call [facility] clinic with any problems, questions or concerns. We can certainly see [facility]  patient much sooner if necessary.  No orders of [facility] defined types were placed in this encounter.  Mikey Bussing, DNP, AGPCNP-BC, AOCNP 08/18/17  ADDENDUM: Hematology/Oncology Attending: I had a face-to-face encounter with [facility] patient.  I recommended her care plan.  This is a very pleasant 72 years old Hispanic Long with metastatic non-small cell lung cancer, adenocarcinoma status post induction systemic chemotherapy with carboplatin and Alimta.  [facility] patient is currently undergoing maintenance treatment with single agent Alimta 500 mg/M2 every 3 weeks status post 24 cycles.  She continues to tolerate this treatment fairly well with no concerning complaints. She was treated recently for flu symptoms and bronchitis during [facility] trip to [place].  Her treatment was delayed by 1 week.  She had a repeat CT  scan of [facility]erie chest, abdomen and pelvis performed recently.  I personally and independently reviewed [facility]erie scans and discussed [facility]erie results with [facility] patient and her sister. Had a scan showed no concerning findings for disease progression.  I recommended for her to continue her current treatment with maintenance Alimta and she will proceed with cycle #25 today. [facility] patient was advised to call immediately if she has any concerning symptoms in [facility] interval.  Disclaimer: This note was dictated with voice recognition software. Similar sounding words can inadvertently be transcribed and may be missed upon review. Eilleen Kempf, MD 08/19/17

## 2017-08-18 NOTE — Telephone Encounter (Signed)
Printed avs and calender of upcoming appointment. Per 3/14 los

## 2017-08-18 NOTE — Assessment & Plan Note (Signed)
This is a very pleasant 72 year old Hispanic female with metastatic non-small cell lung cancer, adenocarcinoma status post induction systemic chemotherapy with carboplatin and Alimta and she is currently on maintenance treatment with single agent Alimta status post 24cycles. The patient continues to tolerate her maintenance treatment fairly well with no concerning complaints. She was recently treated for influenza and pneumonia and has now recovered. She had a restaging CT scan of the chest and is here to discuss the results.  Patient was seen with Dr. Julien Nordmann.  CT scan of the chest does not show any evidence of disease progression.  Recommend that she continue on maintenance Alimta.  She will proceed with cycle #25 as scheduled today.  She will continue to receive Zometa every 12 weeks.  The patient will follow-up in 3 weeks for evaluation prior to cycle #26 of her treatment.  The patient was advised to call immediately if she has any concerning symptoms in the interval. The patient voices understanding of current disease status and treatment options and is in agreement with the current care plan. All questions were answered. The patient knows to call the clinic with any problems, questions or concerns. We can certainly see the patient much sooner if necessary.

## 2017-08-18 NOTE — Patient Instructions (Signed)
[facility] Discharge Instructions for Patients Receiving Chemotherapy  Today you received the following chemotherapy agents Alimta  To help prevent nausea and vomiting after your treatment, we encourage you to take your nausea medication as directed  If you develop nausea and vomiting that is not controlled by your nausea medication, call the clinic.   BELOW ARE SYMPTOMS THAT SHOULD BE REPORTED IMMEDIATELY:  *FEVER GREATER THAN 100.5 F  *CHILLS WITH OR WITHOUT FEVER  NAUSEA AND VOMITING THAT IS NOT CONTROLLED WITH YOUR NAUSEA MEDICATION  *UNUSUAL SHORTNESS OF BREATH  *UNUSUAL BRUISING OR BLEEDING  TENDERNESS IN MOUTH AND THROAT WITH OR WITHOUT PRESENCE OF ULCERS  *URINARY PROBLEMS  *BOWEL PROBLEMS  UNUSUAL RASH Items with * indicate a potential emergency and should be followed up as soon as possible.  Feel free to call the clinic should you have any questions or concerns. The clinic phone number is [phone].  Please show the [facility] at check-in to the Emergency Department and triage nurse.

## 2017-08-25 DIAGNOSIS — H2511 Age-related nuclear cataract, right eye: Secondary | ICD-10-CM | POA: Diagnosis not present

## 2017-08-25 DIAGNOSIS — Z85118 Personal history of other malignant neoplasm of bronchus and lung: Secondary | ICD-10-CM | POA: Diagnosis not present

## 2017-08-25 DIAGNOSIS — Z85830 Personal history of malignant neoplasm of bone: Secondary | ICD-10-CM | POA: Diagnosis not present

## 2017-08-25 DIAGNOSIS — Z9842 Cataract extraction status, left eye: Secondary | ICD-10-CM | POA: Diagnosis not present

## 2017-08-25 DIAGNOSIS — Z961 Presence of intraocular lens: Secondary | ICD-10-CM | POA: Diagnosis not present

## 2017-09-01 ENCOUNTER — Other Ambulatory Visit: Payer: Medicare Other

## 2017-09-01 ENCOUNTER — Ambulatory Visit: Payer: Medicare Other

## 2017-09-01 ENCOUNTER — Ambulatory Visit: Payer: Medicare Other | Admitting: Oncology

## 2017-09-06 NOTE — Telephone Encounter (Signed)
Open in error to sign encounter

## 2017-09-08 ENCOUNTER — Inpatient Hospital Stay: Payer: Medicare Other

## 2017-09-08 ENCOUNTER — Inpatient Hospital Stay (HOSPITAL_BASED_OUTPATIENT_CLINIC_OR_DEPARTMENT_OTHER): Payer: Medicare Other | Admitting: Oncology

## 2017-09-08 ENCOUNTER — Telehealth: Payer: Self-pay | Admitting: Oncology

## 2017-09-08 ENCOUNTER — Encounter: Payer: Self-pay | Admitting: Oncology

## 2017-09-08 ENCOUNTER — Inpatient Hospital Stay: Payer: Medicare Other | Attending: Internal Medicine

## 2017-09-08 VITALS — BP 151/75 | HR 84 | Temp 97.7°F | Resp 17 | Ht 69.0 in | Wt 154.5 lb

## 2017-09-08 DIAGNOSIS — C3491 Malignant neoplasm of unspecified part of right bronchus or lung: Secondary | ICD-10-CM

## 2017-09-08 DIAGNOSIS — C3431 Malignant neoplasm of lower lobe, right bronchus or lung: Secondary | ICD-10-CM

## 2017-09-08 DIAGNOSIS — C7951 Secondary malignant neoplasm of bone: Secondary | ICD-10-CM | POA: Insufficient documentation

## 2017-09-08 DIAGNOSIS — Z5111 Encounter for antineoplastic chemotherapy: Secondary | ICD-10-CM | POA: Diagnosis not present

## 2017-09-08 LAB — CBC WITH DIFFERENTIAL/PLATELET
Basophils Absolute: 0 10*3/uL (ref 0.0–0.1)
Basophils Relative: 1 %
Eosinophils Absolute: 0 10*3/uL (ref 0.0–0.5)
Eosinophils Relative: 0 %
HCT: 28.7 % — ABNORMAL LOW (ref 34.8–46.6)
Hemoglobin: 9.9 g/dL — ABNORMAL LOW (ref 11.6–15.9)
Lymphocytes Relative: 10 %
Lymphs Abs: 0.4 10*3/uL — ABNORMAL LOW (ref 0.9–3.3)
MCH: 33.7 pg (ref 25.1–34.0)
MCHC: 34.5 g/dL (ref 31.5–36.0)
MCV: 97.6 fL (ref 79.5–101.0)
Monocytes Absolute: 0.4 10*3/uL (ref 0.1–0.9)
Monocytes Relative: 9 %
Neutro Abs: 3.4 10*3/uL (ref 1.5–6.5)
Neutrophils Relative %: 80 %
Platelets: 300 10*3/uL (ref 145–400)
RBC: 2.94 MIL/uL — ABNORMAL LOW (ref 3.70–5.45)
RDW: 15.3 % — ABNORMAL HIGH (ref 11.2–14.5)
WBC: 4.2 10*3/uL (ref 3.9–10.3)

## 2017-09-08 LAB — COMPREHENSIVE METABOLIC PANEL
ALT: 20 U/L (ref 0–55)
AST: 32 U/L (ref 5–34)
Albumin: 3.5 g/dL (ref 3.5–5.0)
Alkaline Phosphatase: 60 U/L (ref 40–150)
Anion gap: 9 (ref 3–11)
BUN: 19 mg/dL (ref 7–26)
CO2: 22 mmol/L (ref 22–29)
Calcium: 9.6 mg/dL (ref 8.4–10.4)
Chloride: 107 mmol/L (ref 98–109)
Creatinine, Ser: 1.03 mg/dL (ref 0.60–1.10)
GFR calc Af Amer: >60 mL/min (ref >60)
GFR calc non Af Amer: 54 mL/min — ABNORMAL LOW (ref >60)
Glucose, Bld: 131 mg/dL (ref 70–140)
Potassium: 3.5 mmol/L (ref 3.5–5.1)
Sodium: 138 mmol/L (ref 136–145)
Total Bilirubin: 0.6 mg/dL (ref 0.2–1.2)
Total Protein: 6.8 g/dL (ref 6.4–8.3)

## 2017-09-08 MED ORDER — SODIUM CHLORIDE 0.9 % IV SOLN
525.0000 mg/m2 | Freq: Once | INTRAVENOUS | Status: AC
  Administered 2017-09-08: 1000 mg via INTRAVENOUS
  Filled 2017-09-08: qty 40

## 2017-09-08 MED ORDER — SODIUM CHLORIDE 0.9 % IV SOLN
Freq: Once | INTRAVENOUS | Status: AC
  Administered 2017-09-08: 10:00:00 via INTRAVENOUS

## 2017-09-08 MED ORDER — PROCHLORPERAZINE MALEATE 10 MG PO TABS
ORAL_TABLET | ORAL | Status: AC
  Filled 2017-09-08: qty 1

## 2017-09-08 MED ORDER — PROCHLORPERAZINE MALEATE 10 MG PO TABS
10.0000 mg | ORAL_TABLET | Freq: Once | ORAL | Status: AC
  Administered 2017-09-08: 10 mg via ORAL

## 2017-09-08 MED ORDER — SODIUM CHLORIDE 0.9% FLUSH
10.0000 mL | INTRAVENOUS | Status: DC | PRN
  Administered 2017-09-08 (×2): 10 mL
  Filled 2017-09-08: qty 10

## 2017-09-08 MED ORDER — SODIUM CHLORIDE 0.9% FLUSH
3.0000 mL | INTRAVENOUS | Status: DC | PRN
  Filled 2017-09-08: qty 10

## 2017-09-08 MED ORDER — HEPARIN SOD (PORK) LOCK FLUSH 100 UNIT/ML IV SOLN
500.0000 [IU] | Freq: Once | INTRAVENOUS | Status: AC | PRN
  Administered 2017-09-08: 500 [IU]
  Filled 2017-09-08: qty 5

## 2017-09-08 NOTE — Progress Notes (Signed)
[place], MD [address] [place] [address]  DIAGNOSIS: Stage IV (T1b, N2, M1b) non-small cell lung cancer, adenocarcinoma diagnosed in March 2017 and presented with right lower lobe lung nodule in addition to mediastinal lymphadenopathy and metastatic bone lesions.  Molecular studies:PDL 1 TPS <1%.   [employer] Studies: Positive for ERBB2 [id number]. Negative for EGFR, KRAS, ALK, BRAF, MET, RET and ROS1.  PRIOR THERAPY: 1) Induction systemic chemotherapy with carboplatin for AUC of 5 and Alimta 500 MG/M2 every 3 weeks is status post 6 cycles at the [facility], last dose was given 03/05/2016 with stable disease. 2) palliative radiation to the metastatic bone disease in the lower back and pelvic area.  CURRENT THERAPY: 1) Maintenance systemic chemotherapy with Alimta 500 MG/M2 every 3 weeks status post 24cycles. First dose was given 03/25/2016.  2) Zometa 4 mg IV every 12 week for metastatic bone disease.  INTERVAL HISTORY: Valerie Long 72 y.o. female returns for routine follow-up visit.  The patient is feeling fine today with no specific complaints.  The patient denies fevers and chills.  Denies chest pain, shortness breath, cough, hemoptysis.  Denies nausea, vomiting, constipation, diarrhea.  The patient continues to tolerate her treatment with Alimta fairly well.  The patient is here for evaluation prior to cycle #26 of her treatment.  MEDICAL HISTORY: Past Medical History:  Diagnosis Date  . Adenocarcinoma of right lung, stage 4 ([place]) 2017  . Anemia   . Arthritis   . Bone metastases ([place]) 02/24/2017  . Encounter for antineoplastic chemotherapy 03/17/2016  . Hypercholesterolemia   . Hypertension 06/18/2016  . Osteopenia   . 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 Pneumonia   . Shortness of breath dyspnea     ALLERGIES:  has No Known Allergies.  MEDICATIONS:   Current Outpatient Medications  Medication Sig Dispense Refill  . acetaminophen (TYLENOL) 500 MG tablet Take 1,000 mg by mouth every 6 (six) hours as needed for moderate pain or headache.    . baclofen (LIORESAL) 10 MG tablet Take 1 tablet (10 mg total) by mouth 3 (three) times daily. 30 each 0  . Calcium Carb-Cholecalciferol (CALCIUM 600/VITAMIN D3 PO) Take by mouth.    . Cyanocobalamin (B-12 PO) Take 2 tablets by mouth daily.    Marland Kitchen dexamethasone (DECADRON) 4 MG tablet Take 54m by mouth twice daily the day before, of, and after chemo 40 tablet 1  . Fish Oil-Cholecalciferol (OMEGA-3 + VITAMIN D3 PO) Take 1 tablet by mouth daily.    . folic acid (FOLVITE) 1 MG tablet Take 1 tablet (1 mg total) by mouth daily. 90 tablet 1  . ketorolac (ACULAR) 0.5 % ophthalmic solution Place 1 drop into both eyes 4 (four) times daily.  0  . lidocaine-prilocaine (EMLA) cream Apply 1 application topically as needed. Apply 1-2 tsp over port site 1-2 hours prior to chemo. 30 g 0  . ofloxacin (OCUFLOX) 0.3 % ophthalmic solution Place 1 drop into both eyes 4 (four) times daily.  0  . prednisoLONE acetate (PRED FORTE) 1 % ophthalmic suspension Place 1 drop into both eyes 4 (four) times daily.  0  . PRESCRIPTION MEDICATION Inject 1 Dose as directed every 21 ( twenty-one) days. Chemo    . meloxicam (MOBIC) 15 MG tablet Take 1 tablet (15 mg total) by mouth daily. (Patient not taking: Reported on 08/18/2017) 30 tablet 0  .  ondansetron (ZOFRAN) 8 MG tablet Take 8 mg by mouth every 8 (eight) hours as needed.     No current facility-administered medications for this visit.    Facility-Administered Medications Ordered in Other Visits  Medication Dose Route Frequency Provider Last Rate Last Dose  . sodium chloride flush (NS) 0.9 % injection 10 mL  10 mL Intracatheter PRN Curt Bears, MD   10 mL at 09/08/17 [id number]    SURGICAL HISTORY:  Past Surgical History:  Procedure Laterality Date  . CESAREAN SECTION     myomectomy   . COLONOSCOPY W/ POLYPECTOMY    . IR GENERIC HISTORICAL  08/17/2016   IR US GUIDE VASC ACCESS RIGHT 08/17/2016 [name], MD [facility]  . IR GENERIC HISTORICAL  08/17/2016   IR FLUORO GUIDE PORT INSERTION RIGHT 08/17/2016 [name], MD [facility]  . MEDIASTINOSCOPY N/A 09/25/2015   Procedure: MEDIASTINOSCOPY;  Surgeon: Melrose Nakayama, MD;  Location: [facility];  Service: Thoracic;  Laterality: N/A;  . VIDEO BRONCHOSCOPY Bilateral 08/19/2015   Procedure: VIDEO BRONCHOSCOPY WITH FLUORO;  Surgeon: Rigoberto Noel, MD;  Location: [facility];  Service: Cardiopulmonary;  Laterality: Bilateral;  . VIDEO BRONCHOSCOPY N/A 09/08/2015   Procedure: VIDEO BRONCHOSCOPY WITH FLUORO;  Surgeon: Rigoberto Noel, MD;  Location: [facility];  Service: Thoracic;  Laterality: N/A;  . VIDEO BRONCHOSCOPY WITH ENDOBRONCHIAL ULTRASOUND Right 09/08/2015   Procedure: ATTEMPTED VIDEO BRONCHOSCOPY ENDOBRONCHIAL ULTRASOUND  ;  Surgeon: Rigoberto Noel, MD;  Location: [facility];  Service: Thoracic;  Laterality: Right;    REVIEW OF SYSTEMS:   Review of Systems  Constitutional: Negative for appetite change, chills, fatigue, fever and unexpected weight change.  HENT:   Negative for mouth sores, nosebleeds, sore throat and trouble swallowing.   Eyes: Negative for eye problems and icterus.  Respiratory: Negative for cough, hemoptysis, shortness of breath and wheezing.   Cardiovascular: Negative for chest pain and leg swelling.  Gastrointestinal: Negative for abdominal pain, constipation, diarrhea, nausea and vomiting.  Genitourinary: Negative for bladder incontinence, difficulty urinating, dysuria, frequency and hematuria.   Musculoskeletal: Negative for back pain, gait problem, neck pain and neck stiffness.  Skin: Negative for itching and rash.  Neurological: Negative for dizziness, extremity weakness, gait problem, headaches, light-headedness and seizures.  Hematological: Negative for adenopathy. Does not bruise/bleed  easily.  Psychiatric/Behavioral: Negative for confusion, depression and sleep disturbance. The patient is not nervous/anxious.     PHYSICAL EXAMINATION:  Blood pressure (!) 151/75, pulse 84, temperature 97.7 F (36.5 C), temperature source Oral, resp. rate 17, height '5\' 9"'  (1.753 m), weight 154 lb 8 oz (70.1 kg), last menstrual period 06/09/1998, SpO2 100 %.  ECOG PERFORMANCE STATUS: 1 - Symptomatic but completely ambulatory  Physical Exam  Constitutional: Oriented to person, place, and time and well-developed, well-nourished, and in no distress. No distress.  HENT:  Head: Normocephalic and atraumatic.  Mouth/Throat: Oropharynx is clear and moist. No oropharyngeal exudate.  Eyes: Conjunctivae are normal. Right eye exhibits no discharge. Left eye exhibits no discharge. No scleral icterus.  Neck: Normal range of motion. Neck supple.  Cardiovascular: Normal rate, regular rhythm, normal heart sounds and intact distal pulses.   Pulmonary/Chest: Effort normal and breath sounds normal. No respiratory distress. No wheezes. No rales.  Abdominal: Soft. Bowel sounds are normal. Exhibits no distension and no mass. There is no tenderness.  Musculoskeletal: Normal range of motion. Exhibits no edema.  Lymphadenopathy:    No cervical adenopathy.  Neurological: Alert and oriented to person, place, and time.  Exhibits normal muscle tone. Gait normal. Coordination normal.  Skin: Skin is warm and dry. No rash noted. Not diaphoretic. No erythema. No pallor.  Psychiatric: Mood, memory and judgment normal.  Vitals reviewed.  LABORATORY DATA: Lab Results  Component Value Date   WBC 4.2 09/08/2017   HGB 9.9 (L) 09/08/2017   HCT 28.7 (L) 09/08/2017   MCV 97.6 09/08/2017   PLT 300 09/08/2017      Chemistry      Component Value Date/Time   NA 138 09/08/2017 0754   NA 137 06/09/2017 0901   K 3.5 09/08/2017 0754   K 3.9 06/09/2017 0901   CL 107 09/08/2017 0754   CO2 22 09/08/2017 0754   CO2 25  06/09/2017 0901   BUN 19 09/08/2017 0754   BUN 19.0 06/09/2017 0901   CREATININE 1.03 09/08/2017 0754   CREATININE 1.2 (H) 06/09/2017 0901   GLU 170 01/23/2016      Component Value Date/Time   CALCIUM 9.6 09/08/2017 0754   CALCIUM 9.9 06/09/2017 0901   ALKPHOS 60 09/08/2017 0754   ALKPHOS 63 06/09/2017 0901   AST 32 09/08/2017 0754   AST 35 (H) 06/09/2017 0901   ALT 20 09/08/2017 0754   ALT 23 06/09/2017 0901   BILITOT 0.6 09/08/2017 0754   BILITOT 0.65 06/09/2017 0901       RADIOGRAPHIC STUDIES:  Dg Chest 2 View  Result Date: 08/12/2017 CLINICAL DATA:  Recent fluid pneumonia with lightheadedness and dizzy spells. Chemotherapy patient. EXAM: CHEST - 2 VIEW COMPARISON:  06/08/2017 FINDINGS: The heart size and mediastinal contours are within normal limits. Right port catheter tip is seen in the distal SVC. Streaky parenchymal opacities are seen at the lung bases, slightly more confluent in the area of known primary within the right lower lobe. No effusion or pneumothorax. No acute osseous appearing abnormality. Levoscoliosis of the upper lumbar spine. IMPRESSION: Streaky opacities are seen in both lower lobes similar to prior recent CT and compatible with changes of atelectasis and/or scarring. Slightly more confluent opacity noted however the right lower lobe in the area of known pulmonary primary. Electronically Signed   By: Ashley Royalty M.D.   On: 08/12/2017 15:21   Ct Angio Chest Pe W And/or Wo Contrast  Result Date: 08/12/2017 CLINICAL DATA:  Pulmonary embolism, high pretest probability. Diagnosed with flu 08/06/2017 EXAM: CT ANGIOGRAPHY CHEST WITH CONTRAST TECHNIQUE: Multidetector CT imaging of the chest was performed using the standard protocol during bolus administration of intravenous contrast. Multiplanar CT image reconstructions and MIPs were obtained to evaluate the vascular anatomy. CONTRAST:  126m ISOVUE-370 IOPAMIDOL (ISOVUE-370) INJECTION 76% COMPARISON:  06/08/2017  FINDINGS: Cardiovascular: Satisfactory opacification of the pulmonary arteries to the segmental level. When allowing for motion at the bases there is no evidence of pulmonary embolism. Mild cardiomegaly. No pericardial effusion. Porta catheter from the right with tip at the SVC. Mediastinum/Nodes: Negative for adenopathy. Lungs/Pleura: History of treated right lower lobe lung cancer with spiculated and bandlike density in the right lower lobe where there is motion artifact limiting comparison with prior. Bilateral pulmonary nodules with the largest marked on series 11. No convincing change in nodule size. Upper Abdomen: No acute finding. Cholelithiasis without findings of cholecystitis. Ptotic left kidney. Musculoskeletal: Sclerotic osseous metastases. Review of the MIP images confirms the above findings. IMPRESSION: 1. Motion degraded exam that is negative for pulmonary embolism. 2. Treated metastatic lung cancer without noted change since 06/08/2017 staging scan. Electronically Signed   By: JNeva SeatD.  On: 08/12/2017 17:46   Ct T-spine No Charge  Result Date: 08/12/2017 CLINICAL DATA:  Mid lower back pain.  Cough. EXAM: CT THORACIC SPINE WITHOUT CONTRAST TECHNIQUE: Multidetector CT images of the thoracic were obtained using the standard protocol without intravenous contrast. COMPARISON:  Chest CT dated 06/08/2017 FINDINGS: Alignment: Normal. Vertebrae: There are numerous blastic metastases throughout the thoracic spine. No lytic lesions. No fractures. Paraspinal and other soft tissues: Bilateral pulmonary nodules. Disc levels: T7-8: Degenerative changes of the vertebral endplates. No disc protrusion. T8-9: There is a small calcified disc protrusion into the left lateral recess. There is no visible tumor in the spinal canal. No spinal or foraminal stenosis or significant facet arthritis. Slight right facet arthritis at T4-5. IMPRESSION: 1. No acute abnormality of the thoracic spine. 2. Numerous  blastic metastases in the thoracic spine, unchanged. 3. No visible tumor in the spinal canal. 4. Pulmonary metastases. 5. Small calcified disc protrusion at T8-9 to the left of midline. Electronically Signed   By: Lorriane Shire M.D.   On: 08/12/2017 17:44     ASSESSMENT/PLAN:  Adenocarcinoma of right lung, stage 4 ([facility]) This is a very pleasant 72 year old Hispanic female with metastatic non-small cell lung cancer, adenocarcinoma status post induction systemic chemotherapy with carboplatin and Alimta and she is currently on maintenance treatment with single agent Alimta status post 25cycles. The patient continues to tolerate her maintenance treatment fairly well with no concerning complaints. Recommend that she proceed with cycle #26 of her maintenance Alimta as scheduled today. She will continue to receive Zometa every 12 weeks.  Next dose is due on 09/29/2017.  The patient will follow-up in 3 weeks for evaluation prior to cycle #27 of her treatment.  The patient was advised to call immediately if she has any concerning symptoms in the interval. The patient voices understanding of current disease status and treatment options and is in agreement with the current care plan. All questions were answered. The patient knows to call the clinic with any problems, questions or concerns. We can certainly see the patient much sooner if necessary.    No orders of the defined types were placed in this encounter.  Mikey Bussing, DNP, AGPCNP-BC, AOCNP 09/08/17

## 2017-09-08 NOTE — Assessment & Plan Note (Addendum)
This is a very pleasant 72 year old Hispanic female with metastatic non-small cell lung cancer, adenocarcinoma status post induction systemic chemotherapy with carboplatin and Alimta and she is currently on maintenance treatment with single agent Alimta status post 25cycles. The patient continues to tolerate her maintenance treatment fairly well with no concerning complaints. Recommend that she proceed with cycle #26 of her maintenance Alimta as scheduled today. She will continue to receive Zometa every 12 weeks.  Next dose is due on 09/29/2017.  The patient will follow-up in 3 weeks for evaluation prior to cycle #27 of her treatment.  The patient was advised to call immediately if she has any concerning symptoms in the interval. The patient voices understanding of current disease status and treatment options and is in agreement with the current care plan. All questions were answered. The patient knows to call the clinic with any problems, questions or concerns. We can certainly see the patient much sooner if necessary.

## 2017-09-08 NOTE — Telephone Encounter (Signed)
Scheduled appt per 4/4 los - patient to get an updated schedule in the treatment area.

## 2017-09-08 NOTE — Patient Instructions (Signed)
Implanted Port Home Guide An implanted port is a type of central line that is placed under the skin. Central lines are used to provide IV access when treatment or nutrition needs to be given through a person's veins. Implanted ports are used for long-term IV access. An implanted port may be placed because:  You need IV medicine that would be irritating to the small veins in your hands or arms.  You need long-term IV medicines, such as antibiotics.  You need IV nutrition for a long period.  You need frequent blood draws for lab tests.  You need dialysis.  Implanted ports are usually placed in the chest area, but they can also be placed in the upper arm, the abdomen, or the leg. An implanted port has two main parts:  Reservoir. The reservoir is round and will appear as a small, raised area under your skin. The reservoir is the part where a needle is inserted to give medicines or draw blood.  Catheter. The catheter is a thin, flexible tube that extends from the reservoir. The catheter is placed into a large vein. Medicine that is inserted into the reservoir goes into the catheter and then into the vein.  How will I care for my incision site? Do not get the incision site wet. Bathe or shower as directed by your health care provider. How is my port accessed? Special steps must be taken to access the port:  Before the port is accessed, a numbing cream can be placed on the skin. This helps numb the skin over the port site.  Your health care provider uses a sterile technique to access the port. ? Your health care provider must put on a mask and sterile gloves. ? The skin over your port is cleaned carefully with an antiseptic and allowed to dry. ? The port is gently pinched between sterile gloves, and a needle is inserted into the port.  Only "non-coring" port needles should be used to access the port. Once the port is accessed, a blood return should be checked. This helps ensure that the port  is in the vein and is not clogged.  If your port needs to remain accessed for a constant infusion, a clear (transparent) bandage will be placed over the needle site. The bandage and needle will need to be changed every week, or as directed by your health care provider.  Keep the bandage covering the needle clean and dry. Do not get it wet. Follow your health care provider's instructions on how to take a shower or bath while the port is accessed.  If your port does not need to stay accessed, no bandage is needed over the port.  What is flushing? Flushing helps keep the port from getting clogged. Follow your health care provider's instructions on how and when to flush the port. Ports are usually flushed with saline solution or a medicine called heparin. The need for flushing will depend on how the port is used.  If the port is used for intermittent medicines or blood draws, the port will need to be flushed: ? After medicines have been given. ? After blood has been drawn. ? As part of routine maintenance.  If a constant infusion is running, the port may not need to be flushed.  How long will my port stay implanted? The port can stay in for as long as your health care provider thinks it is needed. When it is time for the port to come out, surgery will be   done to remove it. The procedure is similar to the one performed when the port was put in. When should I seek immediate medical care? When you have an implanted port, you should seek immediate medical care if:  You notice a bad smell coming from the incision site.  You have swelling, redness, or drainage at the incision site.  You have more swelling or pain at the port site or the surrounding area.  You have a fever that is not controlled with medicine.  This information is not intended to replace advice given to you by your health care provider. Make sure you discuss any questions you have with your health care provider. Document  Released: 05/24/2005 Document Revised: 10/30/2015 Document Reviewed: 01/29/2013 Elsevier Interactive Patient Education  2017 [employer]  

## 2017-09-08 NOTE — Patient Instructions (Signed)
[facility] Discharge Instructions for Patients Receiving Chemotherapy  Today you received the following chemotherapy agents Alimta  To help prevent nausea and vomiting after your treatment, we encourage you to take your nausea medication as directed  If you develop nausea and vomiting that is not controlled by your nausea medication, call the clinic.   BELOW ARE SYMPTOMS THAT SHOULD BE REPORTED IMMEDIATELY:  *FEVER GREATER THAN 100.5 F  *CHILLS WITH OR WITHOUT FEVER  NAUSEA AND VOMITING THAT IS NOT CONTROLLED WITH YOUR NAUSEA MEDICATION  *UNUSUAL SHORTNESS OF BREATH  *UNUSUAL BRUISING OR BLEEDING  TENDERNESS IN MOUTH AND THROAT WITH OR WITHOUT PRESENCE OF ULCERS  *URINARY PROBLEMS  *BOWEL PROBLEMS  UNUSUAL RASH Items with * indicate a potential emergency and should be followed up as soon as possible.  Feel free to call the clinic should you have any questions or concerns. The clinic phone number is [phone].  Please show the [facility] at check-in to the Emergency Department and triage nurse.

## 2017-09-22 ENCOUNTER — Other Ambulatory Visit: Payer: Medicare Other

## 2017-09-22 ENCOUNTER — Ambulatory Visit: Payer: Medicare Other

## 2017-09-22 ENCOUNTER — Ambulatory Visit: Payer: Medicare Other | Admitting: Oncology

## 2017-09-28 NOTE — Progress Notes (Signed)
[facility], MD [address] [place] [address]  DIAGNOSIS: Stage IV (T1b, N2, M1b) non-small cell lung cancer, adenocarcinoma diagnosed in March 2017 and presented with right lower lobe lung nodule in addition to mediastinal lymphadenopathy and metastatic bone lesions.  Molecular studies:PDL 1 TPS <1%.   Foundation One Studies: Positive for ERBB2 [id number]. Negative for EGFR, KRAS, ALK, BRAF, MET, RET and ROS1.  PRIOR THERAPY: 1) Induction systemic chemotherapy with carboplatin for AUC of 5 and Alimta 500 MG/M2 every 3 weeks is status post 6 cycles at the [facility], last dose was given 03/05/2016 with stable disease. 2) palliative radiation to the metastatic bone disease in the lower back and pelvic area.  CURRENT THERAPY: 1) Maintenance systemic chemotherapy with Alimta 500 MG/M2 every 3 weeks status post 26cycles. First dose was given 03/25/2016.  2) Zometa 4 mg IV every 12 week for metastatic bone disease.   INTERVAL HISTORY: Valerie Long 72 y.o. female returns for routine follow-up visit by herself.  The patient is feeling fine and has no specific complaints.  She has noticed a mole to her left inner thigh that she would like to have looked at.  She thinks it has been there a while but has noticed it more recently and thinks that it may look different.  She denies fevers and chills.  Denies chest pain, shortness breath, cough, hemoptysis.  Denies nausea, vomiting, constipation, diarrhea.  Patient continues to tolerate her treatment with Alimta fairly well.  The patient is here for evaluation prior to cycle #27 of her treatment.  MEDICAL HISTORY: Past Medical History:  Diagnosis Date  . Adenocarcinoma of right lung, stage 4 ([place]) 2017  . Anemia   . Arthritis   . Bone metastases ([place]) 02/24/2017  . Encounter for antineoplastic chemotherapy 03/17/2016  . Hypercholesterolemia   .  Hypertension 06/18/2016  . Osteopenia   . 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 Pneumonia   . Shortness of breath dyspnea     ALLERGIES:  has No Known Allergies.  MEDICATIONS:  Current Outpatient Medications  Medication Sig Dispense Refill  . acetaminophen (TYLENOL) 500 MG tablet Take 1,000 mg by mouth every 6 (six) hours as needed for moderate pain or headache.    . Calcium Carb-Cholecalciferol (CALCIUM 600/VITAMIN D3 PO) Take by mouth.    . dexamethasone (DECADRON) 4 MG tablet Take 63m by mouth twice daily the day before, of, and after chemo 40 tablet 1  . Fish Oil-Cholecalciferol (OMEGA-3 + VITAMIN D3 PO) Take 1 tablet by mouth daily.    . folic acid (FOLVITE) 1 MG tablet Take 1 tablet (1 mg total) by mouth daily. 90 tablet 1  . ketorolac (ACULAR) 0.5 % ophthalmic solution Place 1 drop into both eyes 4 (four) times daily.  0  . lidocaine-prilocaine (EMLA) cream Apply 1 application topically as needed. Apply 1-2 tsp over port site 1-2 hours prior to chemo. 30 g 0  . ofloxacin (OCUFLOX) 0.3 % ophthalmic solution Place 1 drop into both eyes 4 (four) times daily.  0  . prednisoLONE acetate (PRED FORTE) 1 % ophthalmic suspension Place 1 drop into both eyes 4 (four) times daily.  0  . PRESCRIPTION MEDICATION Inject 1 Dose as directed every 21 ( twenty-one) days. Chemo    . baclofen (LIORESAL) 10 MG tablet Take 1 tablet (10 mg total) by mouth 3 (three) times daily. (Patient  not taking: Reported on 09/29/2017) 30 each 0  . Cyanocobalamin (B-12 PO) Take 2 tablets by mouth daily.    . meloxicam (MOBIC) 15 MG tablet Take 1 tablet (15 mg total) by mouth daily. (Patient not taking: Reported on 08/18/2017) 30 tablet 0  . ondansetron (ZOFRAN) 8 MG tablet Take 8 mg by mouth every 8 (eight) hours as needed.     No current facility-administered medications for this visit.     SURGICAL HISTORY:  Past Surgical History:  Procedure Laterality Date  . CESAREAN SECTION     myomectomy  .  COLONOSCOPY W/ POLYPECTOMY    . IR GENERIC HISTORICAL  08/17/2016   IR US GUIDE VASC ACCESS RIGHT 08/17/2016 [name], MD [facility]  . IR GENERIC HISTORICAL  08/17/2016   IR FLUORO GUIDE PORT INSERTION RIGHT 08/17/2016 [name], MD [facility]  . MEDIASTINOSCOPY N/A 09/25/2015   Procedure: MEDIASTINOSCOPY;  Surgeon: Melrose Nakayama, MD;  Location: [facility];  Service: Thoracic;  Laterality: N/A;  . VIDEO BRONCHOSCOPY Bilateral 08/19/2015   Procedure: VIDEO BRONCHOSCOPY WITH FLUORO;  Surgeon: Rigoberto Noel, MD;  Location: [facility];  Service: Cardiopulmonary;  Laterality: Bilateral;  . VIDEO BRONCHOSCOPY N/A 09/08/2015   Procedure: VIDEO BRONCHOSCOPY WITH FLUORO;  Surgeon: Rigoberto Noel, MD;  Location: [facility];  Service: Thoracic;  Laterality: N/A;  . VIDEO BRONCHOSCOPY WITH ENDOBRONCHIAL ULTRASOUND Right 09/08/2015   Procedure: ATTEMPTED VIDEO BRONCHOSCOPY ENDOBRONCHIAL ULTRASOUND  ;  Surgeon: Rigoberto Noel, MD;  Location: [facility];  Service: Thoracic;  Laterality: Right;    REVIEW OF SYSTEMS:   Review of Systems  Constitutional: Negative for appetite change, chills, fatigue, fever and unexpected weight change.  HENT:   Negative for mouth sores, nosebleeds, sore throat and trouble swallowing.   Eyes: Negative for eye problems and icterus.  Respiratory: Negative for cough, hemoptysis, shortness of breath and wheezing.   Cardiovascular: Negative for chest pain and leg swelling.  Gastrointestinal: Negative for abdominal pain, constipation, diarrhea, nausea and vomiting.  Genitourinary: Negative for bladder incontinence, difficulty urinating, dysuria, frequency and hematuria.   Musculoskeletal: Negative for back pain, gait problem, neck pain and neck stiffness.  Skin: Negative for itching and rash. Notes a mole to her left inner thigh. Neurological: Negative for dizziness, extremity weakness, gait problem, headaches, light-headedness and seizures.  Hematological: Negative for  adenopathy. Does not bruise/bleed easily.  Psychiatric/Behavioral: Negative for confusion, depression and sleep disturbance. The patient is not nervous/anxious.     PHYSICAL EXAMINATION:  Blood pressure (!) 164/79, pulse 86, temperature 97.9 F (36.6 C), temperature source Oral, resp. rate 18, height _0  (1.753 m), weight 156 lb 12.8 oz (71.1 kg), last menstrual period 06/09/1998, SpO2 100 %.  ECOG PERFORMANCE STATUS: 1 - Symptomatic but completely ambulatory  Physical Exam  Constitutional: Oriented to person, place, and time and well-developed, well-nourished, and in no distress. No distress.  HENT:  Head: Normocephalic and atraumatic.  Mouth/Throat: Oropharynx is clear and moist. No oropharyngeal exudate.  Eyes: Conjunctivae are normal. Right eye exhibits no discharge. Left eye exhibits no discharge. No scleral icterus.  Neck: Normal range of motion. Neck supple.  Cardiovascular: Normal rate, regular rhythm, normal heart sounds and intact distal pulses.   Pulmonary/Chest: Effort normal and breath sounds normal. No respiratory distress. No wheezes. No rales.  Abdominal: Soft. Bowel sounds are normal. Exhibits no distension and no mass. There is no tenderness.  Musculoskeletal: Normal range of motion. Exhibits no edema.  Lymphadenopathy:    No cervical  adenopathy.  Neurological: Alert and oriented to person, place, and time. Exhibits normal muscle tone. Gait normal. Coordination normal.  Skin: Skin is warm and dry. No rash noted. Not diaphoretic. No erythema. No pallor. Small seborrheic keratosis noted to the left inner thigh.  Several moles noted to her right upper thigh with no concerning findings. Psychiatric: Mood, memory and judgment normal.  Vitals reviewed.  LABORATORY DATA: Lab Results  Component Value Date   WBC 3.8 (L) 09/29/2017   HGB 10.0 (L) 09/29/2017   HCT 30.0 (L) 09/29/2017   MCV 99.3 09/29/2017   PLT 258 09/29/2017      Chemistry      Component Value  Date/Time   NA 137 09/29/2017 0816   NA 137 06/09/2017 0901   K 3.7 09/29/2017 0816   K 3.9 06/09/2017 0901   CL 107 09/29/2017 0816   CO2 22 09/29/2017 0816   CO2 25 06/09/2017 0901   BUN 17 09/29/2017 0816   BUN 19.0 06/09/2017 0901   CREATININE 1.11 (H) 09/29/2017 0816   CREATININE 1.2 (H) 06/09/2017 0901   GLU 170 01/23/2016      Component Value Date/Time   CALCIUM 10.1 09/29/2017 0816   CALCIUM 9.9 06/09/2017 0901   ALKPHOS 62 09/29/2017 0816   ALKPHOS 63 06/09/2017 0901   AST 34 09/29/2017 0816   AST 35 (H) 06/09/2017 0901   ALT 18 09/29/2017 0816   ALT 23 06/09/2017 0901   BILITOT 0.6 09/29/2017 0816   BILITOT 0.65 06/09/2017 0901       RADIOGRAPHIC STUDIES:  No results found.   ASSESSMENT/PLAN:  Adenocarcinoma of right lung, stage 4 (HCC) This is a very pleasant36year old Hispanic female with metastatic non-small cell lung cancer, adenocarcinoma status post induction systemic chemotherapy with carboplatin and Alimta and she is currently on maintenance treatment with single agent Alimta status post 25cycles. The patient continues to tolerate her maintenance treatment fairly well with no concerning complaints. Recommend that she proceed with cycle #27 of her maintenance Alimta as scheduled today. She will continue to receive Zometa every 12 weeks.  Next dose is due today.  The patient will follow-up in 3 weeks for evaluation prior to cycle #28 of her treatment.  The patient was advised to call immediately if she has any concerning symptoms in the interval. The patient voices understanding of current disease status and treatment options and is in agreement with the current care plan. All questions were answered. The patient knows to call the clinic with any problems, questions or concerns. We can certainly see the patient much sooner if necessary.   No orders of the defined types were placed in this encounter.  Mikey Bussing, DNP, AGPCNP-BC,  AOCNP 09/29/17

## 2017-09-28 NOTE — Assessment & Plan Note (Addendum)
This is a very pleasant41year old Hispanic female with metastatic non-small cell lung cancer, adenocarcinoma status post induction systemic chemotherapy with carboplatin and Alimta and she is currently on maintenance treatment with single agent Alimta status post 25cycles. The patient continues to tolerate her maintenance treatment fairly well with no concerning complaints. Recommend that she proceed with cycle #27 of her maintenance Alimta as scheduled today. She will continue to receive Zometa every 12 weeks.  Next dose is due today.  The patient will follow-up in 3 weeks for evaluation prior to cycle #28 of her treatment.  The patient was advised to call immediately if she has any concerning symptoms in the interval. The patient voices understanding of current disease status and treatment options and is in agreement with the current care plan. All questions were answered. The patient knows to call the clinic with any problems, questions or concerns. We can certainly see the patient much sooner if necessary.

## 2017-09-29 ENCOUNTER — Telehealth: Payer: Self-pay | Admitting: Internal Medicine

## 2017-09-29 ENCOUNTER — Inpatient Hospital Stay (HOSPITAL_BASED_OUTPATIENT_CLINIC_OR_DEPARTMENT_OTHER): Payer: Medicare Other | Admitting: Oncology

## 2017-09-29 ENCOUNTER — Inpatient Hospital Stay: Payer: Medicare Other

## 2017-09-29 ENCOUNTER — Other Ambulatory Visit: Payer: Self-pay

## 2017-09-29 ENCOUNTER — Encounter: Payer: Self-pay | Admitting: Oncology

## 2017-09-29 VITALS — BP 164/79 | HR 86 | Temp 97.9°F | Resp 18 | Ht 69.0 in | Wt 156.8 lb

## 2017-09-29 DIAGNOSIS — C3491 Malignant neoplasm of unspecified part of right bronchus or lung: Secondary | ICD-10-CM

## 2017-09-29 DIAGNOSIS — Z5111 Encounter for antineoplastic chemotherapy: Secondary | ICD-10-CM | POA: Diagnosis not present

## 2017-09-29 DIAGNOSIS — C7951 Secondary malignant neoplasm of bone: Secondary | ICD-10-CM

## 2017-09-29 DIAGNOSIS — C3431 Malignant neoplasm of lower lobe, right bronchus or lung: Secondary | ICD-10-CM | POA: Diagnosis not present

## 2017-09-29 LAB — COMPREHENSIVE METABOLIC PANEL
ALT: 18 U/L (ref 0–55)
AST: 34 U/L (ref 5–34)
Albumin: 3.6 g/dL (ref 3.5–5.0)
Alkaline Phosphatase: 62 U/L (ref 40–150)
Anion gap: 8 (ref 3–11)
BUN: 17 mg/dL (ref 7–26)
CO2: 22 mmol/L (ref 22–29)
Calcium: 10.1 mg/dL (ref 8.4–10.4)
Chloride: 107 mmol/L (ref 98–109)
Creatinine, Ser: 1.11 mg/dL — ABNORMAL HIGH (ref 0.60–1.10)
GFR calc Af Amer: 57 mL/min — ABNORMAL LOW (ref >60)
GFR calc non Af Amer: 49 mL/min — ABNORMAL LOW (ref >60)
Glucose, Bld: 118 mg/dL (ref 70–140)
Potassium: 3.7 mmol/L (ref 3.5–5.1)
Sodium: 137 mmol/L (ref 136–145)
Total Bilirubin: 0.6 mg/dL (ref 0.2–1.2)
Total Protein: 6.9 g/dL (ref 6.4–8.3)

## 2017-09-29 LAB — CBC WITH DIFFERENTIAL/PLATELET
Basophils Absolute: 0 10*3/uL (ref 0.0–0.1)
Basophils Relative: 0 %
Eosinophils Absolute: 0 10*3/uL (ref 0.0–0.5)
Eosinophils Relative: 0 %
HCT: 30 % — ABNORMAL LOW (ref 34.8–46.6)
Hemoglobin: 10 g/dL — ABNORMAL LOW (ref 11.6–15.9)
Lymphocytes Relative: 11 %
Lymphs Abs: 0.4 10*3/uL — ABNORMAL LOW (ref 0.9–3.3)
MCH: 33.1 pg (ref 25.1–34.0)
MCHC: 33.3 g/dL (ref 31.5–36.0)
MCV: 99.3 fL (ref 79.5–101.0)
Monocytes Absolute: 0.3 10*3/uL (ref 0.1–0.9)
Monocytes Relative: 7 %
Neutro Abs: 3.2 10*3/uL (ref 1.5–6.5)
Neutrophils Relative %: 82 %
Platelets: 258 10*3/uL (ref 145–400)
RBC: 3.02 MIL/uL — ABNORMAL LOW (ref 3.70–5.45)
RDW: 15.6 % — ABNORMAL HIGH (ref 11.2–14.5)
WBC: 3.8 10*3/uL — ABNORMAL LOW (ref 3.9–10.3)

## 2017-09-29 MED ORDER — ZOLEDRONIC ACID 4 MG/100ML IV SOLN
4.0000 mg | Freq: Once | INTRAVENOUS | Status: AC
  Administered 2017-09-29: 4 mg via INTRAVENOUS
  Filled 2017-09-29: qty 100

## 2017-09-29 MED ORDER — HEPARIN SOD (PORK) LOCK FLUSH 100 UNIT/ML IV SOLN
500.0000 [IU] | Freq: Once | INTRAVENOUS | Status: AC | PRN
  Administered 2017-09-29: 500 [IU]
  Filled 2017-09-29: qty 5

## 2017-09-29 MED ORDER — SODIUM CHLORIDE 0.9 % IV SOLN
Freq: Once | INTRAVENOUS | Status: AC
  Administered 2017-09-29: 10:00:00 via INTRAVENOUS

## 2017-09-29 MED ORDER — PROCHLORPERAZINE MALEATE 10 MG PO TABS
ORAL_TABLET | ORAL | Status: AC
  Filled 2017-09-29: qty 1

## 2017-09-29 MED ORDER — SODIUM CHLORIDE 0.9% FLUSH
10.0000 mL | INTRAVENOUS | Status: DC | PRN
  Administered 2017-09-29: 10 mL
  Filled 2017-09-29: qty 10

## 2017-09-29 MED ORDER — SODIUM CHLORIDE 0.9 % IV SOLN
525.0000 mg/m2 | Freq: Once | INTRAVENOUS | Status: AC
  Administered 2017-09-29: 1000 mg via INTRAVENOUS
  Filled 2017-09-29: qty 40

## 2017-09-29 MED ORDER — PROCHLORPERAZINE MALEATE 10 MG PO TABS
10.0000 mg | ORAL_TABLET | Freq: Once | ORAL | Status: AC
  Administered 2017-09-29: 10 mg via ORAL

## 2017-09-29 MED ORDER — ZOLEDRONIC ACID 4 MG/5ML IV CONC
3.5000 mg | Freq: Once | INTRAVENOUS | Status: DC
  Filled 2017-09-29: qty 4.38

## 2017-09-29 MED ORDER — SODIUM CHLORIDE 0.9% FLUSH
10.0000 mL | INTRAVENOUS | Status: DC | PRN
  Administered 2017-09-29: 10 mL via INTRAVENOUS
  Filled 2017-09-29: qty 10

## 2017-09-29 NOTE — Telephone Encounter (Signed)
Error opening  

## 2017-09-29 NOTE — Telephone Encounter (Signed)
Next 3 cycles already scheduled per 4/25 los.

## 2017-09-29 NOTE — Patient Instructions (Signed)
[facility] Discharge Instructions for Patients Receiving Chemotherapy  Today you received the following chemotherapy agents Alimta  To help prevent nausea and vomiting after your treatment, we encourage you to take your nausea medication as directed  If you develop nausea and vomiting that is not controlled by your nausea medication, call the clinic.   BELOW ARE SYMPTOMS THAT SHOULD BE REPORTED IMMEDIATELY:  *FEVER GREATER THAN 100.5 F  *CHILLS WITH OR WITHOUT FEVER  NAUSEA AND VOMITING THAT IS NOT CONTROLLED WITH YOUR NAUSEA MEDICATION  *UNUSUAL SHORTNESS OF BREATH  *UNUSUAL BRUISING OR BLEEDING  TENDERNESS IN MOUTH AND THROAT WITH OR WITHOUT PRESENCE OF ULCERS  *URINARY PROBLEMS  *BOWEL PROBLEMS  UNUSUAL RASH Items with * indicate a potential emergency and should be followed up as soon as possible.  Feel free to call the clinic should you have any questions or concerns. The clinic phone number is [phone].  Please show the [facility] at check-in to the Emergency Department and triage nurse.

## 2017-10-20 ENCOUNTER — Encounter: Payer: Self-pay | Admitting: Internal Medicine

## 2017-10-20 ENCOUNTER — Telehealth: Payer: Self-pay | Admitting: Internal Medicine

## 2017-10-20 ENCOUNTER — Inpatient Hospital Stay: Payer: Medicare Other

## 2017-10-20 ENCOUNTER — Other Ambulatory Visit: Payer: Self-pay | Admitting: Internal Medicine

## 2017-10-20 ENCOUNTER — Inpatient Hospital Stay: Payer: Medicare Other | Attending: Internal Medicine | Admitting: Internal Medicine

## 2017-10-20 VITALS — BP 150/76 | HR 80 | Temp 97.9°F | Resp 17 | Ht 69.0 in | Wt 154.2 lb

## 2017-10-20 DIAGNOSIS — C3490 Malignant neoplasm of unspecified part of unspecified bronchus or lung: Secondary | ICD-10-CM

## 2017-10-20 DIAGNOSIS — I10 Essential (primary) hypertension: Secondary | ICD-10-CM

## 2017-10-20 DIAGNOSIS — Z5111 Encounter for antineoplastic chemotherapy: Secondary | ICD-10-CM | POA: Diagnosis present

## 2017-10-20 DIAGNOSIS — Z79899 Other long term (current) drug therapy: Secondary | ICD-10-CM | POA: Diagnosis not present

## 2017-10-20 DIAGNOSIS — Z95828 Presence of other vascular implants and grafts: Secondary | ICD-10-CM

## 2017-10-20 DIAGNOSIS — C771 Secondary and unspecified malignant neoplasm of intrathoracic lymph nodes: Secondary | ICD-10-CM | POA: Diagnosis not present

## 2017-10-20 DIAGNOSIS — C3431 Malignant neoplasm of lower lobe, right bronchus or lung: Secondary | ICD-10-CM

## 2017-10-20 DIAGNOSIS — D6481 Anemia due to antineoplastic chemotherapy: Secondary | ICD-10-CM

## 2017-10-20 DIAGNOSIS — M545 Low back pain: Secondary | ICD-10-CM

## 2017-10-20 DIAGNOSIS — T451X5A Adverse effect of antineoplastic and immunosuppressive drugs, initial encounter: Secondary | ICD-10-CM

## 2017-10-20 DIAGNOSIS — C7951 Secondary malignant neoplasm of bone: Secondary | ICD-10-CM | POA: Diagnosis present

## 2017-10-20 DIAGNOSIS — C3491 Malignant neoplasm of unspecified part of right bronchus or lung: Secondary | ICD-10-CM

## 2017-10-20 LAB — CBC WITH DIFFERENTIAL/PLATELET
Basophils Absolute: 0 10*3/uL (ref 0.0–0.1)
Basophils Relative: 0 %
Eosinophils Absolute: 0 10*3/uL (ref 0.0–0.5)
Eosinophils Relative: 0 %
HCT: 29.5 % — ABNORMAL LOW (ref 34.8–46.6)
Hemoglobin: 10 g/dL — ABNORMAL LOW (ref 11.6–15.9)
Lymphocytes Relative: 10 %
Lymphs Abs: 0.5 10*3/uL — ABNORMAL LOW (ref 0.9–3.3)
MCH: 33.9 pg (ref 25.1–34.0)
MCHC: 33.9 g/dL (ref 31.5–36.0)
MCV: 99.8 fL (ref 79.5–101.0)
Monocytes Absolute: 0.3 10*3/uL (ref 0.1–0.9)
Monocytes Relative: 7 %
Neutro Abs: 3.9 10*3/uL (ref 1.5–6.5)
Neutrophils Relative %: 83 %
Platelets: 252 10*3/uL (ref 145–400)
RBC: 2.95 MIL/uL — ABNORMAL LOW (ref 3.70–5.45)
RDW: 15.9 % — ABNORMAL HIGH (ref 11.2–14.5)
WBC: 4.7 10*3/uL (ref 3.9–10.3)

## 2017-10-20 LAB — COMPREHENSIVE METABOLIC PANEL
ALT: 19 U/L (ref 0–55)
AST: 33 U/L (ref 5–34)
Albumin: 3.5 g/dL (ref 3.5–5.0)
Alkaline Phosphatase: 58 U/L (ref 40–150)
Anion gap: 8 (ref 3–11)
BUN: 17 mg/dL (ref 7–26)
CO2: 23 mmol/L (ref 22–29)
Calcium: 9.5 mg/dL (ref 8.4–10.4)
Chloride: 107 mmol/L (ref 98–109)
Creatinine, Ser: 1.08 mg/dL (ref 0.60–1.10)
GFR calc Af Amer: 59 mL/min — ABNORMAL LOW (ref >60)
GFR calc non Af Amer: 51 mL/min — ABNORMAL LOW (ref >60)
Glucose, Bld: 167 mg/dL — ABNORMAL HIGH (ref 70–140)
Potassium: 3.5 mmol/L (ref 3.5–5.1)
Sodium: 138 mmol/L (ref 136–145)
Total Bilirubin: 0.6 mg/dL (ref 0.2–1.2)
Total Protein: 6.6 g/dL (ref 6.4–8.3)

## 2017-10-20 MED ORDER — PROCHLORPERAZINE MALEATE 10 MG PO TABS
ORAL_TABLET | ORAL | Status: AC
  Filled 2017-10-20: qty 1

## 2017-10-20 MED ORDER — CYANOCOBALAMIN 1000 MCG/ML IJ SOLN
1000.0000 ug | Freq: Once | INTRAMUSCULAR | Status: AC
  Administered 2017-10-20: 1000 ug via INTRAMUSCULAR

## 2017-10-20 MED ORDER — PROCHLORPERAZINE MALEATE 10 MG PO TABS
10.0000 mg | ORAL_TABLET | Freq: Once | ORAL | Status: AC
  Administered 2017-10-20: 10 mg via ORAL

## 2017-10-20 MED ORDER — SODIUM CHLORIDE 0.9% FLUSH
10.0000 mL | INTRAVENOUS | Status: DC | PRN
  Administered 2017-10-20: 10 mL via INTRAVENOUS
  Filled 2017-10-20: qty 10

## 2017-10-20 MED ORDER — SODIUM CHLORIDE 0.9 % IV SOLN
1000.0000 mg | Freq: Once | INTRAVENOUS | Status: AC
  Administered 2017-10-20: 1000 mg via INTRAVENOUS
  Filled 2017-10-20: qty 40

## 2017-10-20 MED ORDER — SODIUM CHLORIDE 0.9 % IV SOLN
Freq: Once | INTRAVENOUS | Status: AC
Start: 2017-10-20 — End: 2017-10-20
  Administered 2017-10-20: 09:00:00 via INTRAVENOUS

## 2017-10-20 MED ORDER — SODIUM CHLORIDE 0.9% FLUSH
10.0000 mL | INTRAVENOUS | Status: DC | PRN
  Administered 2017-10-20: 10 mL
  Filled 2017-10-20: qty 10

## 2017-10-20 MED ORDER — CYANOCOBALAMIN 1000 MCG/ML IJ SOLN
INTRAMUSCULAR | Status: AC
  Filled 2017-10-20: qty 1

## 2017-10-20 MED ORDER — FOLIC ACID 1 MG PO TABS: 1.0000 mg | ORAL_TABLET | Freq: Every day | ORAL | 1 refills | Status: DC

## 2017-10-20 MED ORDER — HEPARIN SOD (PORK) LOCK FLUSH 100 UNIT/ML IV SOLN
500.0000 [IU] | Freq: Once | INTRAVENOUS | Status: AC | PRN
  Administered 2017-10-20: 500 [IU]
  Filled 2017-10-20: qty 5

## 2017-10-20 NOTE — Telephone Encounter (Signed)
Scheduled appt per 5/16 los - pt to get an updated schedule.

## 2017-10-20 NOTE — Progress Notes (Signed)
[facility] [phone]   Fax:(336) [place], MD [address] [place] [address]  DIAGNOSIS: Stage IV (T1b, N2, M1b) non-small cell lung cancer, adenocarcinoma diagnosed in March 2017 and presented with right lower lobe lung nodule in addition to mediastinal lymphadenopathy and metastatic bone lesions.  Molecular studies: PDL 1 TPS  <1%.   Foundation One Studies: Positive for ERBB2 [id number]. Negative for EGFR, KRAS, ALK, BRAF, MET, RET and ROS1.  PRIOR THERAPY:  1) Induction systemic chemotherapy with carboplatin for AUC of 5 and Alimta 500 MG/M2 every 3 weeks is status post 6 cycles at the [facility], last dose was given 03/05/2016 with stable disease. 2) palliative radiation to the metastatic bone disease in the lower back and pelvic area.  CURRENT THERAPY::  1)  Maintenance systemic chemotherapy with Alimta 500 MG/M2 every 3 weeks status post 27 cycles. First dose was given 03/25/2016.  2) Zometa 4 mg IV every 12 week for metastatic bone disease.  INTERVAL HIST Valerie Long 72 y.o. female returns to the clinic today for follow-up visit accompanied by her sister.  The patient is feeling fine today with no specific complaints except for low back pain.  She is currently on Mobic.  She denied having any chest pain, shortness breath, cough or hemoptysis.  She denied having any weight loss or night sweats.  She has no nausea, vomiting, diarrhea or constipation.  She has no fever or chills.  She is here today for evaluation before starting cycle #28 of her treatment.  MEDICAL HISTORY: Past Medical History:  Diagnosis Date  . Adenocarcinoma of right lung, stage 4 ([place] [place]) 2017  . Anemia   . Arthritis   . Bone metastases ([facility]) 02/24/2017  . Encounter for antineoplastic chemotherapy 03/17/2016  . Hypercholesterolemia   . Hypertension 06/18/2016  . Osteopenia   . Pelvic kidney    Left.  On [place] in [place]  . Pneumonia   . Shortness of breath dyspnea     ALLERGIES:  has No Known Allergies.  MEDICATIONS:  Current Outpatient Medications  Medication Sig Dispense Refill  . acetaminophen (TYLENOL) 500 MG tablet Take 1,000 mg by mouth every 6 (six) hours as needed for moderate pain or headache.    . baclofen (LIORESAL) 10 MG tablet Take 1 tablet (10 mg total) by mouth 3 (three) times daily. (Patient not taking: Reported on 09/29/2017) 30 each 0  . Calcium Carb-Cholecalciferol (CALCIUM 600/VITAMIN D3 PO) Take by mouth.    . Cyanocobalamin (B-12 PO) Take 2 tablets by mouth daily.    Marland Kitchen dexamethasone (DECADRON) 4 MG tablet Take 28m by mouth twice daily the day before, of, and after chemo 40 tablet 1  . Fish Oil-Cholecalciferol (OMEGA-3 + VITAMIN D3 PO) Take 1 tablet by mouth daily.    . folic acid (FOLVITE) 1 MG tablet Take 1 tablet (1 mg total) by mouth daily. 90 tablet 1  . ketorolac (ACULAR) 0.5 % ophthalmic solution Place 1 drop into both eyes 4 (four) times daily.  0  . lidocaine-prilocaine (EMLA) cream Apply 1 application topically as needed. Apply 1-2 tsp over port site 1-2 hours prior to chemo. 30 g 0  . meloxicam (MOBIC) 15 MG tablet Take 1 tablet (15 mg total) by mouth daily. (Patient not taking: Reported on 08/18/2017) 30 tablet 0  . ofloxacin (OCUFLOX) 0.3 % ophthalmic solution Place 1 drop into both eyes 4 (  four) times daily.  0  . ondansetron (ZOFRAN) 8 MG tablet Take 8 mg by mouth every 8 (eight) hours as needed.    . prednisoLONE acetate (PRED FORTE) 1 % ophthalmic suspension Place 1 drop into both eyes 4 (four) times daily.  0  . PRESCRIPTION MEDICATION Inject 1 Dose as directed every 21 ( twenty-one) days. Chemo     No current facility-administered medications for this visit.     SURGICAL HISTORY:  Past Surgical History:  Procedure Laterality Date  . CESAREAN SECTION     myomectomy  . COLONOSCOPY W/ POLYPECTOMY    . IR GENERIC HISTORICAL  08/17/2016    IR US GUIDE VASC ACCESS RIGHT 08/17/2016 [name], MD [facility]  . IR GENERIC HISTORICAL  08/17/2016   IR FLUORO GUIDE PORT INSERTION RIGHT 08/17/2016 [name], MD [facility]  . MEDIASTINOSCOPY N/A 09/25/2015   Procedure: MEDIASTINOSCOPY;  Surgeon: Melrose Nakayama, MD;  Location: [facility];  Service: Thoracic;  Laterality: N/A;  . VIDEO BRONCHOSCOPY Bilateral 08/19/2015   Procedure: VIDEO BRONCHOSCOPY WITH FLUORO;  Surgeon: Rigoberto Noel, MD;  Location: [facility];  Service: Cardiopulmonary;  Laterality: Bilateral;  . VIDEO BRONCHOSCOPY N/A 09/08/2015   Procedure: VIDEO BRONCHOSCOPY WITH FLUORO;  Surgeon: Rigoberto Noel, MD;  Location: [facility];  Service: Thoracic;  Laterality: N/A;  . VIDEO BRONCHOSCOPY WITH ENDOBRONCHIAL ULTRASOUND Right 09/08/2015   Procedure: ATTEMPTED VIDEO BRONCHOSCOPY ENDOBRONCHIAL ULTRASOUND  ;  Surgeon: Rigoberto Noel, MD;  Location: [facility];  Service: Thoracic;  Laterality: Right;    REVIEW OF SYSTEMS:  A comprehensive review of systems was negative except for: Musculoskeletal: positive for back pain   PHYSICAL EXAMINATION: General appearance: alert, cooperative and no distress Head: Normocephalic, without obvious abnormality, atraumatic Neck: no adenopathy, no JVD, supple, symmetrical, trachea midline and thyroid not enlarged, symmetric, no tenderness/mass/nodules Lymph nodes: Cervical, supraclavicular, and axillary nodes normal. Resp: clear to auscultation bilaterally Back: symmetric, no curvature. ROM normal. No CVA tenderness. Cardio: regular rate and rhythm, S1, S2 normal, no murmur, click, rub or gallop GI: soft, non-tender; bowel sounds normal; no masses,  no organomegaly Extremities: extremities normal, atraumatic, no cyanosis or edema  ECOG PERFORMANCE STATUS: 1 - Symptomatic but completely ambulatory  Blood pressure (!) 150/76, pulse 80, temperature 97.9 F (36.6 C), temperature source Oral, resp. rate 17, height '5\' 9"'  (1.753 m), weight 154  lb 3.2 oz (69.9 kg), last menstrual period 06/09/1998, SpO2 100 %.  LABORATORY DATA: Lab Results  Component Value Date   WBC 4.7 10/20/2017   HGB 10.0 (L) 10/20/2017   HCT 29.5 (L) 10/20/2017   MCV 99.8 10/20/2017   PLT 252 10/20/2017      Chemistry      Component Value Date/Time   NA 137 09/29/2017 0816   NA 137 06/09/2017 0901   K 3.7 09/29/2017 0816   K 3.9 06/09/2017 0901   CL 107 09/29/2017 0816   CO2 22 09/29/2017 0816   CO2 25 06/09/2017 0901   BUN 17 09/29/2017 0816   BUN 19.0 06/09/2017 0901   CREATININE 1.11 (H) 09/29/2017 0816   CREATININE 1.2 (H) 06/09/2017 0901   GLU 170 01/23/2016      Component Value Date/Time   CALCIUM 10.1 09/29/2017 0816   CALCIUM 9.9 06/09/2017 0901   ALKPHOS 62 09/29/2017 0816   ALKPHOS 63 06/09/2017 0901   AST 34 09/29/2017 0816   AST 35 (H) 06/09/2017 0901   ALT 18 09/29/2017 0816   ALT 23 06/09/2017  0901   BILITOT 0.6 09/29/2017 0816   BILITOT 0.65 06/09/2017 0901       RADIOGRAPHIC STUDIES: No results found.  ASSESSMENT AND PLAN:  This is a very pleasant 72 years old Hispanic female with metastatic non-small cell lung cancer, adenocarcinoma status post induction systemic chemotherapy with carboplatin and Alimta and she is currently on maintenance treatment with single agent Alimta status post 27 cycles. The patient continues to tolerate this treatment well with no concerning complaints.  I recommended for her to proceed with cycle #28 today as a scheduled. I will see her back for follow-up visit in 3 weeks for evaluation after repeating CT scan of the chest, abdomen and pelvis for restaging of her disease. She was advised to call immediately if she has any concerning symptoms in the interval. The patient voices understanding of current disease status and treatment options and is in agreement with the current care plan. All questions were answered. The patient knows to call the clinic with any problems, questions or concerns.  We can certainly see the patient much sooner if necessary.  Disclaimer: This note was dictated with voice recognition software. Similar sounding words can inadvertently be transcribed and may not be corrected upon review.

## 2017-10-20 NOTE — Patient Instructions (Signed)
[facility] Discharge Instructions for Patients Receiving Chemotherapy  Today you received the following chemotherapy agents Alimta  To help prevent nausea and vomiting after your treatment, we encourage you to take your nausea medication as directed If you develop nausea and vomiting that is not controlled by your nausea medication, call the clinic.   BELOW ARE SYMPTOMS THAT SHOULD BE REPORTED IMMEDIATELY:  *FEVER GREATER THAN 100.5 F  *CHILLS WITH OR WITHOUT FEVER  NAUSEA AND VOMITING THAT IS NOT CONTROLLED WITH YOUR NAUSEA MEDICATION  *UNUSUAL SHORTNESS OF BREATH  *UNUSUAL BRUISING OR BLEEDING  TENDERNESS IN MOUTH AND THROAT WITH OR WITHOUT PRESENCE OF ULCERS  *URINARY PROBLEMS  *BOWEL PROBLEMS  UNUSUAL RASH Items with * indicate a potential emergency and should be followed up as soon as possible.  Feel free to call the clinic should you have any questions or concerns. The clinic phone number is [phone].  Please show the [facility] at check-in to the Emergency Department and triage nurse.

## 2017-11-08 ENCOUNTER — Encounter (HOSPITAL_COMMUNITY): Payer: Self-pay

## 2017-11-08 ENCOUNTER — Ambulatory Visit (HOSPITAL_COMMUNITY)
Admission: RE | Admit: 2017-11-08 | Discharge: 2017-11-08 | Disposition: A | Discharge status: Home / Self Care | Payer: Medicare Other | Source: Ambulatory Visit | Attending: Internal Medicine | Admitting: Internal Medicine

## 2017-11-08 DIAGNOSIS — K8020 Calculus of gallbladder without cholecystitis without obstruction: Secondary | ICD-10-CM | POA: Diagnosis not present

## 2017-11-08 DIAGNOSIS — C3431 Malignant neoplasm of lower lobe, right bronchus or lung: Secondary | ICD-10-CM | POA: Insufficient documentation

## 2017-11-08 DIAGNOSIS — Q433 Congenital malformations of intestinal fixation: Secondary | ICD-10-CM | POA: Insufficient documentation

## 2017-11-08 DIAGNOSIS — I700 Atherosclerosis of aorta: Secondary | ICD-10-CM | POA: Diagnosis not present

## 2017-11-08 DIAGNOSIS — C7802 Secondary malignant neoplasm of left lung: Secondary | ICD-10-CM | POA: Diagnosis not present

## 2017-11-08 DIAGNOSIS — C7951 Secondary malignant neoplasm of bone: Secondary | ICD-10-CM | POA: Insufficient documentation

## 2017-11-08 DIAGNOSIS — C3490 Malignant neoplasm of unspecified part of unspecified bronchus or lung: Secondary | ICD-10-CM | POA: Diagnosis present

## 2017-11-08 MED ORDER — IOPAMIDOL (ISOVUE-300) INJECTION 61%
INTRAVENOUS | Status: AC
  Filled 2017-11-08: qty 100

## 2017-11-08 MED ORDER — IOPAMIDOL (ISOVUE-300) INJECTION 61%
100.0000 mL | Freq: Once | INTRAVENOUS | Status: AC | PRN
  Administered 2017-11-08: 100 mL via INTRAVENOUS

## 2017-11-10 ENCOUNTER — Inpatient Hospital Stay: Payer: Medicare Other

## 2017-11-10 ENCOUNTER — Encounter: Payer: Self-pay | Admitting: Nurse Practitioner

## 2017-11-10 ENCOUNTER — Inpatient Hospital Stay: Payer: Medicare Other | Attending: Internal Medicine

## 2017-11-10 ENCOUNTER — Inpatient Hospital Stay (HOSPITAL_BASED_OUTPATIENT_CLINIC_OR_DEPARTMENT_OTHER): Payer: Medicare Other | Admitting: Nurse Practitioner

## 2017-11-10 VITALS — BP 133/77 | HR 83 | Temp 98.0°F | Resp 18 | Ht 69.0 in | Wt 155.5 lb

## 2017-11-10 DIAGNOSIS — T451X5A Adverse effect of antineoplastic and immunosuppressive drugs, initial encounter: Secondary | ICD-10-CM

## 2017-11-10 DIAGNOSIS — C3491 Malignant neoplasm of unspecified part of right bronchus or lung: Secondary | ICD-10-CM

## 2017-11-10 DIAGNOSIS — D6481 Anemia due to antineoplastic chemotherapy: Secondary | ICD-10-CM

## 2017-11-10 DIAGNOSIS — Z5111 Encounter for antineoplastic chemotherapy: Secondary | ICD-10-CM | POA: Insufficient documentation

## 2017-11-10 DIAGNOSIS — C3431 Malignant neoplasm of lower lobe, right bronchus or lung: Secondary | ICD-10-CM | POA: Insufficient documentation

## 2017-11-10 DIAGNOSIS — C7951 Secondary malignant neoplasm of bone: Secondary | ICD-10-CM | POA: Diagnosis not present

## 2017-11-10 DIAGNOSIS — R911 Solitary pulmonary nodule: Secondary | ICD-10-CM | POA: Diagnosis not present

## 2017-11-10 DIAGNOSIS — G893 Neoplasm related pain (acute) (chronic): Secondary | ICD-10-CM | POA: Insufficient documentation

## 2017-11-10 LAB — COMPREHENSIVE METABOLIC PANEL
ALT: 17 U/L (ref 0–55)
AST: 34 U/L (ref 5–34)
Albumin: 3.5 g/dL (ref 3.5–5.0)
Alkaline Phosphatase: 63 U/L (ref 40–150)
Anion gap: 9 (ref 3–11)
BUN: 16 mg/dL (ref 7–26)
CO2: 23 mmol/L (ref 22–29)
Calcium: 9.7 mg/dL (ref 8.4–10.4)
Chloride: 106 mmol/L (ref 98–109)
Creatinine, Ser: 1.08 mg/dL (ref 0.60–1.10)
GFR calc Af Amer: 59 mL/min — ABNORMAL LOW (ref >60)
GFR calc non Af Amer: 51 mL/min — ABNORMAL LOW (ref >60)
Glucose, Bld: 139 mg/dL (ref 70–140)
Potassium: 3.7 mmol/L (ref 3.5–5.1)
Sodium: 138 mmol/L (ref 136–145)
Total Bilirubin: 0.6 mg/dL (ref 0.2–1.2)
Total Protein: 6.6 g/dL (ref 6.4–8.3)

## 2017-11-10 LAB — CBC WITH DIFFERENTIAL/PLATELET
Basophils Absolute: 0 10*3/uL (ref 0.0–0.1)
Basophils Relative: 0 %
Eosinophils Absolute: 0 10*3/uL (ref 0.0–0.5)
Eosinophils Relative: 0 %
HCT: 30.3 % — ABNORMAL LOW (ref 34.8–46.6)
Hemoglobin: 9.9 g/dL — ABNORMAL LOW (ref 11.6–15.9)
Lymphocytes Relative: 15 %
Lymphs Abs: 0.6 10*3/uL — ABNORMAL LOW (ref 0.9–3.3)
MCH: 33 pg (ref 25.1–34.0)
MCHC: 32.7 g/dL (ref 31.5–36.0)
MCV: 101 fL (ref 79.5–101.0)
Monocytes Absolute: 0.3 10*3/uL (ref 0.1–0.9)
Monocytes Relative: 8 %
Neutro Abs: 2.9 10*3/uL (ref 1.5–6.5)
Neutrophils Relative %: 77 %
Platelets: 233 10*3/uL (ref 145–400)
RBC: 3 MIL/uL — ABNORMAL LOW (ref 3.70–5.45)
RDW: 15 % — ABNORMAL HIGH (ref 11.2–14.5)
WBC: 3.8 10*3/uL — ABNORMAL LOW (ref 3.9–10.3)

## 2017-11-10 MED ORDER — SODIUM CHLORIDE 0.9 % IV SOLN
Freq: Once | INTRAVENOUS | Status: AC
  Administered 2017-11-10: 10:00:00 via INTRAVENOUS

## 2017-11-10 MED ORDER — PROCHLORPERAZINE MALEATE 10 MG PO TABS
ORAL_TABLET | ORAL | Status: AC
  Filled 2017-11-10: qty 1

## 2017-11-10 MED ORDER — SODIUM CHLORIDE 0.9% FLUSH
10.0000 mL | INTRAVENOUS | Status: DC | PRN
  Administered 2017-11-10: 10 mL
  Filled 2017-11-10: qty 10

## 2017-11-10 MED ORDER — PROCHLORPERAZINE MALEATE 10 MG PO TABS
10.0000 mg | ORAL_TABLET | Freq: Once | ORAL | Status: AC
  Administered 2017-11-10: 10 mg via ORAL

## 2017-11-10 MED ORDER — SODIUM CHLORIDE 0.9 % IV SOLN
530.0000 mg/m2 | Freq: Once | INTRAVENOUS | Status: AC
  Administered 2017-11-10: 1000 mg via INTRAVENOUS
  Filled 2017-11-10: qty 40

## 2017-11-10 MED ORDER — HEPARIN SOD (PORK) LOCK FLUSH 100 UNIT/ML IV SOLN
500.0000 [IU] | Freq: Once | INTRAVENOUS | Status: AC | PRN
  Administered 2017-11-10: 500 [IU]
  Filled 2017-11-10: qty 5

## 2017-11-10 MED ORDER — MELOXICAM 15 MG PO TABS: 15.0000 mg | ORAL_TABLET | Freq: Every day | ORAL | 0 refills | Status: DC | PRN

## 2017-11-10 NOTE — Patient Instructions (Signed)
[facility] Discharge Instructions for Patients Receiving Chemotherapy  Today you received the following chemotherapy agents alimta  To help prevent nausea and vomiting after your treatment, we encourage you to take your nausea medication as directed  If you develop nausea and vomiting that is not controlled by your nausea medication, call the clinic.   BELOW ARE SYMPTOMS THAT SHOULD BE REPORTED IMMEDIATELY:  *FEVER GREATER THAN 100.5 F  *CHILLS WITH OR WITHOUT FEVER  NAUSEA AND VOMITING THAT IS NOT CONTROLLED WITH YOUR NAUSEA MEDICATION  *UNUSUAL SHORTNESS OF BREATH  *UNUSUAL BRUISING OR BLEEDING  TENDERNESS IN MOUTH AND THROAT WITH OR WITHOUT PRESENCE OF ULCERS  *URINARY PROBLEMS  *BOWEL PROBLEMS  UNUSUAL RASH Items with * indicate a potential emergency and should be followed up as soon as possible.  Feel free to call the clinic you have any questions or concerns. The clinic phone number is [phone].

## 2017-11-10 NOTE — Progress Notes (Addendum)
[place]  [phone] [phone]  Clinic Follow up Note   Patient Care Team: Colon Branch, MD as PCP - General (Internal Medicine) Christain Sacramento, MD as Consulting Physician (Oncology) Curt Bears, MD as Consulting Physician (Oncology) Rigoberto Noel, MD as Consulting Physician (Pulmonary Disease) 11/10/2017  DIAGNOSIS: Stage IV (T1b, N2, M1b) non-small cell lung cancer, adenocarcinoma diagnosed in March 2017 and presented with right lower lobe lung nodule in addition to mediastinal lymphadenopathy and metastatic bone lesions.  Molecular studies:PDL 1 TPS <1%.   [employer] One Studies: Positive for ERBB2 [id number]. Negative for EGFR, KRAS, ALK, BRAF, MET, RET and ROS1.  PRIOR THERAPY:  1) Induction systemic chemotherapy with carboplatin for AUC of 5 and Alimta 500 MG/M2 every 3 weeks is status post 6 cycles at the [facility], last dose was given 03/05/2016 with stable disease. 2) palliative radiation to the metastatic bone disease in the lower back and pelvic area.  CURRENT THERAPY::  1)  Maintenance systemic chemotherapy with Alimta 500 MG/M2 every 3 weeks status post 27 cycles. First dose was given 03/25/2016.  2) Zometa 4 mg IV every 12 week for metastatic bone disease.   INTERVAL HISTORY: Ms. Disanti returns for follow up as scheduled to discuss restaging CT and next cycle maintenance alimta. She completed cycle 28 on 10/20/17. She has worsening of chronic low back pain over time. Pain occasionally radiates to buttock/legs, on R>L. Denies neuropathy, weakness, or loss of bladder/bowel function. She was on meloxicam per ER which helped but has run out. Takes baclofen PRN.   REVIEW OF SYSTEMS:   Constitutional: Denies fatigue, fevers, chills, decreased appetite, or abnormal weight loss Ears, nose, mouth, throat, and face: Denies mucositis  Respiratory: Denies cough, hemoptysis, dyspnea or wheezes Cardiovascular:  Denies palpitation, chest discomfort or lower extremity swelling Gastrointestinal:  Denies nausea, vomiting, constipation, diarrhea,  Skin: Denies abnormal skin rashes Lymphatics: Denies new lymphadenopathy or easy bruising Neurological:Denies numbness, tingling or new weaknesses Behavioral/Psych: Mood is stable, no new changes  MSK: (+) chronic low back pain, worsening over time (+) back pain occasional radiates down legs, R>L All other systems were reviewed with the patient and are negative.  MEDICAL HISTORY:  Past Medical History:  Diagnosis Date  . Adenocarcinoma of right lung, stage 4 ([place]) 2017  . Anemia   . Arthritis   . Bone metastases ([place]) 02/24/2017  . Encounter for antineoplastic chemotherapy 03/17/2016  . Hypercholesterolemia   . Hypertension 06/18/2016  . Osteopenia   . 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 Pneumonia   . Shortness of breath dyspnea     SURGICAL HISTORY: Past Surgical History:  Procedure Laterality Date  . CESAREAN SECTION     myomectomy  . COLONOSCOPY W/ POLYPECTOMY    . IR GENERIC HISTORICAL  08/17/2016   IR US GUIDE VASC ACCESS RIGHT 08/17/2016 [name], MD [facility]-INTERV RAD  . IR GENERIC HISTORICAL  08/17/2016   IR FLUORO GUIDE PORT INSERTION RIGHT 08/17/2016 [name], MD [facility]  . MEDIASTINOSCOPY N/A 09/25/2015   Procedure: MEDIASTINOSCOPY;  Surgeon: Melrose Nakayama, MD;  Location: [place];  Service: Thoracic;  Laterality: N/A;  . VIDEO BRONCHOSCOPY Bilateral 08/19/2015   Procedure: VIDEO BRONCHOSCOPY WITH FLUORO;  Surgeon: Rigoberto Noel, MD;  Location: [place];  Service: Cardiopulmonary;  Laterality: Bilateral;  . VIDEO BRONCHOSCOPY N/A 09/08/2015   Procedure: VIDEO BRONCHOSCOPY WITH FLUORO;  Surgeon: Rigoberto Noel, MD;  Location: [facility]  OR;  Service: Thoracic;  Laterality: N/A;  . VIDEO BRONCHOSCOPY WITH ENDOBRONCHIAL ULTRASOUND Right 09/08/2015   Procedure: ATTEMPTED VIDEO BRONCHOSCOPY ENDOBRONCHIAL ULTRASOUND   ;  Surgeon: Rigoberto Noel, MD;  Location: [place];  Service: Thoracic;  Laterality: Right;    I have reviewed the social history and family history with the patient and they are unchanged from previous note.  ALLERGIES:  has No Known Allergies.  MEDICATIONS:  Current Outpatient Medications  Medication Sig Dispense Refill  . acetaminophen (TYLENOL) 500 MG tablet Take 1,000 mg by mouth every 6 (six) hours as needed for moderate pain or headache.    . baclofen (LIORESAL) 10 MG tablet Take 1 tablet (10 mg total) by mouth 3 (three) times daily. 30 each 0  . Calcium Carb-Cholecalciferol (CALCIUM 600/VITAMIN D3 PO) Take by mouth.    . Cyanocobalamin (B-12 PO) Take 2 tablets by mouth daily.    Marland Kitchen dexamethasone (DECADRON) 4 MG tablet Take 9m by mouth twice daily the day before, of, and after chemo 40 tablet 1  . Fish Oil-Cholecalciferol (OMEGA-3 + VITAMIN D3 PO) Take 1 tablet by mouth daily.    . folic acid (FOLVITE) 1 MG tablet Take 1 tablet (1 mg total) by mouth daily. 90 tablet 1  . lidocaine-prilocaine (EMLA) cream Apply 1 application topically as needed. Apply 1-2 tsp over port site 1-2 hours prior to chemo. 30 g 0  . ofloxacin (OCUFLOX) 0.3 % ophthalmic solution Place 1 drop into both eyes 4 (four) times daily.  0  . ondansetron (ZOFRAN) 8 MG tablet Take 8 mg by mouth every 8 (eight) hours as needed.    . prednisoLONE acetate (PRED FORTE) 1 % ophthalmic suspension Place 1 drop into both eyes 4 (four) times daily.  0  . PRESCRIPTION MEDICATION Inject 1 Dose as directed every 21 ( twenty-one) days. Chemo    . ketorolac (ACULAR) 0.5 % ophthalmic solution Place 1 drop into both eyes 4 (four) times daily.  0  . meloxicam (MOBIC) 15 MG tablet Take 1 tablet (15 mg total) by mouth daily as needed for pain. 30 tablet 0   No current facility-administered medications for this visit.    Facility-Administered Medications Ordered in Other Visits  Medication Dose Route Frequency Provider Last Rate Last Dose   . sodium chloride flush (NS) 0.9 % injection 10 mL  10 mL Intracatheter PRN MCurt Bears MD   10 mL at 11/10/17 1026    PHYSICAL EXAMINATION: ECOG PERFORMANCE STATUS: 1 - Symptomatic but completely ambulatory  Vitals:   11/10/17 0827  BP: 133/77  Pulse: 83  Resp: 18  Temp: 98 F (36.7 C)  SpO2: 100%   Filed Weights   11/10/17 0827  Weight: 155 lb 8 oz (70.5 kg)    GENERAL:alert, no distress and comfortable SKIN: no rashes or significant lesions EYES: normal, Conjunctiva are pink and non-injected, sclera clear OROPHARYNX:no thrush or ulcers LYMPH:  no palpable cervical or supraclavicular lymphadenopathy  LUNGS: clear to auscultation with normal breathing effort HEART: regular rate & rhythm and no murmurs, trace lower extremity edema ABDOMEN:abdomen soft, non-tender and normal bowel sounds Musculoskeletal:no cyanosis of digits and no clubbing  NEURO: alert & oriented x 3 with fluent speech, no focal motor/sensory deficits PAC without erythema   LABORATORY DATA:  I have reviewed the data as listed CBC Latest Ref Rng & Units 11/10/2017 10/20/2017 09/29/2017  WBC 3.9 - 10.3 K/uL 3.8(L) 4.7 3.8(L)  Hemoglobin 11.6 - 15.9 g/dL 9.9(L) 10.0(L) 10.0(L)  Hematocrit  34.8 - 46.6 % 30.3(L) 29.5(L) 30.0(L)  Platelets 145 - 400 K/uL 233 252 258     CMP Latest Ref Rng & Units 11/10/2017 10/20/2017 09/29/2017  Glucose 70 - 140 mg/dL 139 167(H) 118  BUN 7 - 26 mg/dL '16 17 17  ' Creatinine 0.60 - 1.10 mg/dL 1.08 1.08 1.11(H)  Sodium 136 - 145 mmol/L 138 138 137  Potassium 3.5 - 5.1 mmol/L 3.7 3.5 3.7  Chloride 98 - 109 mmol/L 106 107 107  CO2 22 - 29 mmol/L '23 23 22  ' Calcium 8.4 - 10.4 mg/dL 9.7 9.5 10.1  Total Protein 6.4 - 8.3 g/dL 6.6 6.6 6.9  Total Bilirubin 0.2 - 1.2 mg/dL 0.6 0.6 0.6  Alkaline Phos 40 - 150 U/L 63 58 62  AST 5 - 34 U/L 34 33 34  ALT 0 - 55 U/L '17 19 18     ' RADIOGRAPHIC STUDIES: I have personally reviewed the radiological images as listed and agreed with the  findings in the report. Ct Chest W Contrast  Result Date: 11/09/2017 CLINICAL DATA:  Stage IV right lower lung adenocarcinoma diagnosed 2017 with ongoing chemotherapy, presenting for restaging. EXAM: CT CHEST, ABDOMEN, AND PELVIS WITH CONTRAST TECHNIQUE: Multidetector CT imaging of the chest, abdomen and pelvis was performed following the standard protocol during bolus administration of intravenous contrast. CONTRAST:  110m ISOVUE-300 IOPAMIDOL (ISOVUE-300) INJECTION 61% COMPARISON:  06/08/2017 CT chest, abdomen and pelvis. FINDINGS: CT CHEST FINDINGS Cardiovascular: Stable top-normal heart size. No significant pericardial effusion/thickening. Right internal jugular MediPort terminates in the lower third of the SVC. Atherosclerotic nonaneurysmal thoracic aorta. Normal caliber pulmonary arteries. No central pulmonary emboli. Mediastinum/Nodes: No discrete thyroid nodules. Unremarkable esophagus. No pathologically enlarged axillary, mediastinal or hilar lymph nodes. Lungs/Pleura: No pneumothorax. No pleural effusion. Irregular anterior right lower lobe 2.3 x 1.7 cm pulmonary nodule (series 7/image 84), mildly increased from 2.0 x 1.3 cm. Numerous (greater than 10) irregular subsolid pulmonary nodules scattered throughout both lungs, all mildly increased in size. Right upper lobe 9 mm nodule (series 7/image 45), increased from 6 mm. Right middle lobe 7 mm nodule (series 7/image 77), increased from 4 mm. Left upper lobe 7 mm nodule (series 7/image 74), increased from 5 mm. Left lower lobe 9 mm nodule (series 7/image 64), increased from 7 mm. Musculoskeletal: Numerous small sclerotic lesions scattered throughout the thoracic skeleton, not appreciably changed. No appreciable new focal osseous lesions. Mild thoracic spondylosis. CT ABDOMEN PELVIS FINDINGS Hepatobiliary: Normal liver size. Subcentimeter hypodense segment 2 left liver lobe lesion is too small to characterize and stable. No new liver lesions.  Cholelithiasis. No biliary ductal dilatation. Pancreas: Normal, with no mass or duct dilation. Spleen: Normal size. No mass. Adrenals/Urinary Tract: Normal adrenals. Ectopic malrotated upper left pelvic kidney. Orthotopic right kidney. No renal masses. No hydronephrosis. Stable appearance of the nondistended bladder with top normal bladder wall thickness. Stomach/Bowel: Normal non-distended stomach. Small bowel loops are located entirely in the right abdomen. Duodenal jejunal junction appears to the right of the spine. Normal caliber small bowel with no small bowel wall thickening. Appendix. Normal large bowel with no diverticulosis, large bowel wall thickening or pericolonic fat stranding. Vascular/Lymphatic: Atherosclerotic nonaneurysmal abdominal aorta. Patent portal, splenic, hepatic and renal veins. No pathologically enlarged lymph nodes in the abdomen or pelvis. Reproductive: Grossly normal uterus.  No adnexal mass. Other: No pneumoperitoneum, ascites or focal fluid collection. Stable tiny superior periumbilical fat containing hernia. Musculoskeletal: Numerous small sclerotic lesions scattered throughout lumbar spine and bilateral pelvic girdle, not appreciably  changed. Marked lumbar spondylosis. IMPRESSION: 1. Mild interval growth of primary right lower lobe pulmonary neoplasm. 2. Mild interval growth of numerous bilateral pulmonary metastases. 3. Small sclerotic osseous metastases scattered throughout the skeleton, not appreciably changed. 4. No new sites of metastatic disease. 5. Chronic findings include: Aortic Atherosclerosis (ICD10-I70.0). Cholelithiasis. 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 Electronically Signed   By: Ilona Sorrel M.D.   On: 11/09/2017 08:54   Ct Abdomen Pelvis W Contrast  Result Date: 11/09/2017 CLINICAL DATA:  Stage IV right lower lung adenocarcinoma diagnosed 2017 with ongoing chemotherapy, presenting for restaging. EXAM: CT CHEST,  ABDOMEN, AND PELVIS WITH CONTRAST TECHNIQUE: Multidetector CT imaging of the chest, abdomen and pelvis was performed following the standard protocol during bolus administration of intravenous contrast. CONTRAST:  142m ISOVUE-300 IOPAMIDOL (ISOVUE-300) INJECTION 61% COMPARISON:  06/08/2017 CT chest, abdomen and pelvis. FINDINGS: CT CHEST FINDINGS Cardiovascular: Stable top-normal heart size. No significant pericardial effusion/thickening. Right internal jugular MediPort terminates in the lower third of the SVC. Atherosclerotic nonaneurysmal thoracic aorta. Normal caliber pulmonary arteries. No central pulmonary emboli. Mediastinum/Nodes: No discrete thyroid nodules. Unremarkable esophagus. No pathologically enlarged axillary, mediastinal or hilar lymph nodes. Lungs/Pleura: No pneumothorax. No pleural effusion. Irregular anterior right lower lobe 2.3 x 1.7 cm pulmonary nodule (series 7/image 84), mildly increased from 2.0 x 1.3 cm. Numerous (greater than 10) irregular subsolid pulmonary nodules scattered throughout both lungs, all mildly increased in size. Right upper lobe 9 mm nodule (series 7/image 45), increased from 6 mm. Right middle lobe 7 mm nodule (series 7/image 77), increased from 4 mm. Left upper lobe 7 mm nodule (series 7/image 74), increased from 5 mm. Left lower lobe 9 mm nodule (series 7/image 64), increased from 7 mm. Musculoskeletal: Numerous small sclerotic lesions scattered throughout the thoracic skeleton, not appreciably changed. No appreciable new focal osseous lesions. Mild thoracic spondylosis. CT ABDOMEN PELVIS FINDINGS Hepatobiliary: Normal liver size. Subcentimeter hypodense segment 2 left liver lobe lesion is too small to characterize and stable. No new liver lesions. Cholelithiasis. No biliary ductal dilatation. Pancreas: Normal, with no mass or duct dilation. Spleen: Normal size. No mass. Adrenals/Urinary Tract: Normal adrenals. Ectopic malrotated upper left pelvic kidney. Orthotopic  right kidney. No renal masses. No hydronephrosis. Stable appearance of the nondistended bladder with top normal bladder wall thickness. Stomach/Bowel: Normal non-distended stomach. Small bowel loops are located entirely in the right abdomen. Duodenal jejunal junction appears to the right of the spine. Normal caliber small bowel with no small bowel wall thickening. Appendix. Normal large bowel with no diverticulosis, large bowel wall thickening or pericolonic fat stranding. Vascular/Lymphatic: Atherosclerotic nonaneurysmal abdominal aorta. Patent portal, splenic, hepatic and renal veins. No pathologically enlarged lymph nodes in the abdomen or pelvis. Reproductive: Grossly normal uterus.  No adnexal mass. Other: No pneumoperitoneum, ascites or focal fluid collection. Stable tiny superior periumbilical fat containing hernia. Musculoskeletal: Numerous small sclerotic lesions scattered throughout lumbar spine and bilateral pelvic girdle, not appreciably changed. Marked lumbar spondylosis. IMPRESSION: 1. Mild interval growth of primary right lower lobe pulmonary neoplasm. 2. Mild interval growth of numerous bilateral pulmonary metastases. 3. Small sclerotic osseous metastases scattered throughout the skeleton, not appreciably changed. 4. No new sites of metastatic disease. 5. Chronic findings include: Aortic Atherosclerosis (ICD10-I70.0). Cholelithiasis. 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 Electronically Signed   By: JIlona SorrelM.D.   On: 11/09/2017 08:54     ASSESSMENT & PLAN:  This is a very pleasant  72 years old Hispanic female with metastatic non-small cell lung cancer, adenocarcinoma status post induction systemic chemotherapy with carboplatin and Alimta and she is currently on maintenance treatment with single agent Alimta status post 28 cycles. She continues to tolerate treatment very well overall.  The patient was seen with Dr. Julien Nordmann who reviewed her  restaging CT, which shows mild interval growth of primary right lower lobe mass and mild interval growth of numerous bilateral pulmonary metastases. Small sclerotic osseous metastases scattered throughout the skeleton are not appreciably changed. No new sites of metastatic disease. Dr. Julien Nordmann discussed treatment options including continuing single agent alimta q3 weeks vs adding carboplatin to alimta. Patient prefers to continue single agent alimta; will give 3 additional cycles then restage.   She has chronic low back pain that has gradually worsened over time; occasionally radiates to buttock/legs without other signs of cord compression. I will refill her meloxicam, which currently controls her pain.   Labs reviewed, CBC and CMP stable. Adequate to proceed with cycle 29 alimta 500 mg/m2 today. Continue zometa q12 weeks, last dose given 09/29/17. F/u with Dr. Julien Nordmann in 3 weeks with cycle 30.   All questions were answered. The patient knows to call the clinic with any problems, questions or concerns. No barriers to learning was detected.     Alla Feeling, NP 11/10/17   ADDENDUM: Hematology/Oncology Attending: I had a face-to-face encounter with the patient.  I recommended her care plan.  This is a very pleasant 72 years old Hispanic female with metastatic non-small cell lung cancer, adenocarcinoma.  She is currently on maintenance treatment with single agent Alimta status post 28 cycles.  She has been tolerating this treatment well except for the intermittent low back pain secondary to osseous metastasis at that area she received palliative radiotherapy to this area before. The patient had a repeat CT scan of the chest, abdomen and pelvis performed recently.  I personally and independently reviewed the scan images and discussed the result and showed the images to the patient today.  Her scan showed mild increase in some of the pulmonary nodules but stable sclerotic bone metastasis.  I discussed  with the patient option for treatment of her condition including adding carboplatin to her current treatment with Alimta versus continuation with the current treatment with single agent Alimta for now on close monitoring of her disease versus consideration of treatment with immunotherapy.  The patient would like to continue her current maintenance treatment with Alimta for now.  She will proceed with cycle #29 today.  We will see her back for follow-up visit in 3 weeks before starting cycle #30. For the low back pain, she was given refill of meloxicam. The patient was advised to call immediately if she has any concerning symptoms in the interval.  Disclaimer: This note was dictated with voice recognition software. Similar sounding words can inadvertently be transcribed and may be missed upon review. Eilleen Kempf, MD 11/11/17

## 2017-11-11 ENCOUNTER — Telehealth: Payer: Self-pay | Admitting: Nurse Practitioner

## 2017-11-11 NOTE — Telephone Encounter (Signed)
No los 6/6 °

## 2017-12-01 ENCOUNTER — Encounter: Payer: Self-pay | Admitting: Internal Medicine

## 2017-12-01 ENCOUNTER — Inpatient Hospital Stay: Payer: Medicare Other

## 2017-12-01 ENCOUNTER — Inpatient Hospital Stay (HOSPITAL_BASED_OUTPATIENT_CLINIC_OR_DEPARTMENT_OTHER): Payer: Medicare Other | Admitting: Internal Medicine

## 2017-12-01 ENCOUNTER — Telehealth: Payer: Self-pay

## 2017-12-01 DIAGNOSIS — Z5111 Encounter for antineoplastic chemotherapy: Secondary | ICD-10-CM | POA: Diagnosis not present

## 2017-12-01 DIAGNOSIS — C3491 Malignant neoplasm of unspecified part of right bronchus or lung: Secondary | ICD-10-CM

## 2017-12-01 DIAGNOSIS — C3431 Malignant neoplasm of lower lobe, right bronchus or lung: Secondary | ICD-10-CM

## 2017-12-01 DIAGNOSIS — M545 Low back pain: Secondary | ICD-10-CM | POA: Diagnosis not present

## 2017-12-01 DIAGNOSIS — C3490 Malignant neoplasm of unspecified part of unspecified bronchus or lung: Secondary | ICD-10-CM

## 2017-12-01 DIAGNOSIS — R911 Solitary pulmonary nodule: Secondary | ICD-10-CM | POA: Diagnosis not present

## 2017-12-01 DIAGNOSIS — G893 Neoplasm related pain (acute) (chronic): Secondary | ICD-10-CM | POA: Diagnosis not present

## 2017-12-01 DIAGNOSIS — C7951 Secondary malignant neoplasm of bone: Secondary | ICD-10-CM | POA: Diagnosis not present

## 2017-12-01 LAB — CBC WITH DIFFERENTIAL/PLATELET
Basophils Absolute: 0 10*3/uL (ref 0.0–0.1)
Basophils Relative: 0 %
Eosinophils Absolute: 0 10*3/uL (ref 0.0–0.5)
Eosinophils Relative: 0 %
HCT: 31.9 % — ABNORMAL LOW (ref 34.8–46.6)
Hemoglobin: 10.9 g/dL — ABNORMAL LOW (ref 11.6–15.9)
Lymphocytes Relative: 11 %
Lymphs Abs: 0.5 10*3/uL — ABNORMAL LOW (ref 0.9–3.3)
MCH: 33.9 pg (ref 25.1–34.0)
MCHC: 34.2 g/dL (ref 31.5–36.0)
MCV: 99.1 fL (ref 79.5–101.0)
Monocytes Absolute: 0.4 10*3/uL (ref 0.1–0.9)
Monocytes Relative: 8 %
Neutro Abs: 4 10*3/uL (ref 1.5–6.5)
Neutrophils Relative %: 81 %
Platelets: 230 10*3/uL (ref 145–400)
RBC: 3.21 MIL/uL — ABNORMAL LOW (ref 3.70–5.45)
RDW: 14.9 % — ABNORMAL HIGH (ref 11.2–14.5)
WBC: 5 10*3/uL (ref 3.9–10.3)

## 2017-12-01 LAB — COMPREHENSIVE METABOLIC PANEL
ALT: 20 U/L (ref 0–44)
AST: 32 U/L (ref 15–41)
Albumin: 3.7 g/dL (ref 3.5–5.0)
Alkaline Phosphatase: 67 U/L (ref 38–126)
Anion gap: 7 (ref 5–15)
BUN: 23 mg/dL (ref 8–23)
CO2: 26 mmol/L (ref 22–32)
Calcium: 10 mg/dL (ref 8.9–10.3)
Chloride: 103 mmol/L (ref 98–111)
Creatinine, Ser: 1.12 mg/dL — ABNORMAL HIGH (ref 0.44–1.00)
GFR calc Af Amer: 56 mL/min — ABNORMAL LOW (ref >60)
GFR calc non Af Amer: 49 mL/min — ABNORMAL LOW (ref >60)
Glucose, Bld: 131 mg/dL — ABNORMAL HIGH (ref 70–99)
Potassium: 3.9 mmol/L (ref 3.5–5.1)
Sodium: 136 mmol/L (ref 135–145)
Total Bilirubin: 0.6 mg/dL (ref 0.3–1.2)
Total Protein: 7.1 g/dL (ref 6.5–8.1)

## 2017-12-01 MED ORDER — SODIUM CHLORIDE 0.9 % IV SOLN
Freq: Once | INTRAVENOUS | Status: AC
  Administered 2017-12-01: 10:00:00 via INTRAVENOUS

## 2017-12-01 MED ORDER — SODIUM CHLORIDE 0.9% FLUSH
10.0000 mL | INTRAVENOUS | Status: DC | PRN
  Administered 2017-12-01: 10 mL
  Filled 2017-12-01: qty 10

## 2017-12-01 MED ORDER — PROCHLORPERAZINE MALEATE 10 MG PO TABS
10.0000 mg | ORAL_TABLET | Freq: Once | ORAL | Status: AC
  Administered 2017-12-01: 10 mg via ORAL

## 2017-12-01 MED ORDER — PROCHLORPERAZINE MALEATE 10 MG PO TABS
ORAL_TABLET | ORAL | Status: AC
  Filled 2017-12-01: qty 1

## 2017-12-01 MED ORDER — HEPARIN SOD (PORK) LOCK FLUSH 100 UNIT/ML IV SOLN
500.0000 [IU] | Freq: Once | INTRAVENOUS | Status: AC | PRN
  Administered 2017-12-01: 500 [IU]
  Filled 2017-12-01: qty 5

## 2017-12-01 MED ORDER — OXYCODONE-ACETAMINOPHEN 5-325 MG PO TABS: 1.0000 | ORAL_TABLET | Freq: Three times a day (TID) | ORAL | 0 refills | Status: DC | PRN

## 2017-12-01 MED ORDER — PEMETREXED DISODIUM CHEMO INJECTION 500 MG
525.0000 mg/m2 | Freq: Once | INTRAVENOUS | Status: AC
Start: 2017-12-01 — End: 2017-12-01
  Administered 2017-12-01: 1000 mg via INTRAVENOUS
  Filled 2017-12-01: qty 40

## 2017-12-01 MED ORDER — SODIUM CHLORIDE 0.9% FLUSH
10.0000 mL | INTRAVENOUS | Status: DC | PRN
  Administered 2017-12-01: 10 mL via INTRAVENOUS
  Filled 2017-12-01: qty 10

## 2017-12-01 NOTE — Progress Notes (Signed)
[facility] [phone]   Fax:([phone]) [place], MD [address] [place] [address]  DIAGNOSIS: Stage IV (T1b, N2, M1b) non-small cell lung cancer, adenocarcinoma diagnosed in March 2017 and presented with right lower lobe lung nodule in addition to mediastinal lymphadenopathy and metastatic bone lesions.  Molecular studies: PDL 1 TPS  <1%.   Foundation One Studies: Positive for ERBB2 [id number]. Negative for EGFR, KRAS, ALK, BRAF, MET, RET and ROS1.  PRIOR THERAPY:  1) Induction systemic chemotherapy with carboplatin for AUC of 5 and Alimta 500 MG/M2 every 3 weeks is status post 6 cycles at the [facility], last dose was given 03/05/2016 with stable disease. 2) palliative radiation to the metastatic bone disease in the lower back and pelvic area.  CURRENT THERAPY::  1)  Maintenance systemic chemotherapy with Alimta 500 MG/M2 every 3 weeks status post 29 cycles. First dose was given 03/25/2016.  2) Zometa 4 mg IV every 12 week for metastatic bone disease.  INTERVAL HIST Valerie Long 72 y.o. female returns to the clinic today for follow-up visit accompanied by her sister.  The patient is feeling fine today with no specific complaints except for the persistent low back pain with radiation to the right leg.  She used meloxicam with no improvement of her condition.  She used Tylenol as needed.  She denied having any chest pain, shortness of breath, cough or hemoptysis.  She denied having any fever or chills.  She has no nausea, vomiting, diarrhea or constipation.  She continues to tolerate her treatment with maintenance Alimta fairly well.  She is here for evaluation before starting cycle #30.   MEDICAL HISTORY: Past Medical History:  Diagnosis Date  . Adenocarcinoma of right lung, stage 4 ([place]) 2017  . Anemia   . Arthritis   . Bone metastases ([place]) 02/24/2017  . Encounter for  antineoplastic chemotherapy 03/17/2016  . Hypercholesterolemia   . Hypertension 06/18/2016  . Osteopenia   . 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 Pneumonia   . Shortness of breath dyspnea     ALLERGIES:  has No Known Allergies.  MEDICATIONS:  Current Outpatient Medications  Medication Sig Dispense Refill  . acetaminophen (TYLENOL) 500 MG tablet Take 1,000 mg by mouth every 6 (six) hours as needed for moderate pain or headache.    . baclofen (LIORESAL) 10 MG tablet Take 1 tablet (10 mg total) by mouth 3 (three) times daily. 30 each 0  . Calcium Carb-Cholecalciferol (CALCIUM 600/VITAMIN D3 PO) Take by mouth.    . Cyanocobalamin (B-12 PO) Take 2 tablets by mouth daily.    Marland Kitchen dexamethasone (DECADRON) 4 MG tablet Take 70m by mouth twice daily the day before, of, and after chemo 40 tablet 1  . Fish Oil-Cholecalciferol (OMEGA-3 + VITAMIN D3 PO) Take 1 tablet by mouth daily.    . folic acid (FOLVITE) 1 MG tablet Take 1 tablet (1 mg total) by mouth daily. 90 tablet 1  . ketorolac (ACULAR) 0.5 % ophthalmic solution Place 1 drop into both eyes 4 (four) times daily.  0  . lidocaine-prilocaine (EMLA) cream Apply 1 application topically as needed. Apply 1-2 tsp over port site 1-2 hours prior to chemo. 30 g 0  . meloxicam (MOBIC) 15 MG tablet Take 1 tablet (15 mg total) by mouth daily as needed for pain. 30 tablet 0  . ofloxacin (OCUFLOX)  0.3 % ophthalmic solution Place 1 drop into both eyes 4 (four) times daily.  0  . ondansetron (ZOFRAN) 8 MG tablet Take 8 mg by mouth every 8 (eight) hours as needed.    . prednisoLONE acetate (PRED FORTE) 1 % ophthalmic suspension Place 1 drop into both eyes 4 (four) times daily.  0  . PRESCRIPTION MEDICATION Inject 1 Dose as directed every 21 ( twenty-one) days. Chemo     No current facility-administered medications for this visit.     SURGICAL HISTORY:  Past Surgical History:  Procedure Laterality Date  . CESAREAN SECTION     myomectomy  .  COLONOSCOPY W/ POLYPECTOMY    . IR GENERIC HISTORICAL  08/17/2016   IR US GUIDE VASC ACCESS RIGHT 08/17/2016 [name], MD [facility]INTER[facility]  . IR GENERIC HISTORICAL  08/17/2016   IR FLUORO GUIDE PORT INSERTION RIGHT 08/17/2016 [name], MD [facility]  . MEDIASTINOSCOPY N/A 09/25/2015   Procedure: MEDIASTINOSCOPY;  Surgeon: Melrose Nakayama, MD;  Location: [facility];  Service: Thoracic;  Laterality: N/A;  . VIDEO BRONCHOSCOPY Bilateral 08/19/2015   Procedure: VIDEO BRONCHOSCOPY WITH FLUORO;  Surgeon: Rigoberto Noel, MD;  Location: [facility];  Service: Cardiopulmonary;  Laterality: Bilateral;  . VIDEO BRONCHOSCOPY N/A 09/08/2015   Procedure: VIDEO BRONCHOSCOPY WITH FLUORO;  Surgeon: Rigoberto Noel, MD;  Location: [facility];  Service: Thoracic;  Laterality: N/A;  . VIDEO BRONCHOSCOPY WITH ENDOBRONCHIAL ULTRASOUND Right 09/08/2015   Procedure: ATTEMPTED VIDEO BRONCHOSCOPY ENDOBRONCHIAL ULTRASOUND  ;  Surgeon: Rigoberto Noel, MD;  Location: [facility];  Service: Thoracic;  Laterality: Right;    REVIEW OF SYSTEMS:  A comprehensive review of systems was negative except for: Musculoskeletal: positive for back pain   PHYSICAL EXAMINATION: General appearance: alert, cooperative and no distress Head: Normocephalic, without obvious abnormality, atraumatic Neck: no adenopathy, no JVD, supple, symmetrical, trachea midline and thyroid not enlarged, symmetric, no tenderness/mass/nodules Lymph nodes: Cervical, supraclavicular, and axillary nodes normal. Resp: clear to auscultation bilaterally Back: symmetric, no curvature. ROM normal. No CVA tenderness. Cardio: regular rate and rhythm, S1, S2 normal, no murmur, click, rub or gallop GI: soft, non-tender; bowel sounds normal; no masses,  no organomegaly Extremities: extremities normal, atraumatic, no cyanosis or edema  ECOG PERFORMANCE STATUS: 1 - Symptomatic but completely ambulatory  Blood pressure (!) 149/85, pulse 87, temperature 98.4 F (36.9 C),  temperature source Oral, resp. rate 18, height 5' 9" (1.753 m), weight 152 lb 11.2 oz (69.3 kg), last menstrual period 06/09/1998, SpO2 100 %.  LABORATORY DATA: Lab Results  Component Value Date   WBC 5.0 12/01/2017   HGB 10.9 (L) 12/01/2017   HCT 31.9 (L) 12/01/2017   MCV 99.1 12/01/2017   PLT 230 12/01/2017      Chemistry      Component Value Date/Time   NA 138 11/10/2017 0746   NA 137 06/09/2017 0901   K 3.7 11/10/2017 0746   K 3.9 06/09/2017 0901   CL 106 11/10/2017 0746   CO2 23 11/10/2017 0746   CO2 25 06/09/2017 0901   BUN 16 11/10/2017 0746   BUN 19.0 06/09/2017 0901   CREATININE 1.08 11/10/2017 0746   CREATININE 1.2 (H) 06/09/2017 0901   GLU 170 01/23/2016      Component Value Date/Time   CALCIUM 9.7 11/10/2017 0746   CALCIUM 9.9 06/09/2017 0901   ALKPHOS 63 11/10/2017 0746   ALKPHOS 63 06/09/2017 0901   AST 34 11/10/2017 0746   AST 35 (H) 06/09/2017 0901  ALT 17 11/10/2017 0746   ALT 23 06/09/2017 0901   BILITOT 0.6 11/10/2017 0746   BILITOT 0.65 06/09/2017 0901       RADIOGRAPHIC STUDIES: Ct Chest W Contrast  Result Date: 11/09/2017 CLINICAL DATA:  Stage IV right lower lung adenocarcinoma diagnosed 2017 with ongoing chemotherapy, presenting for restaging. EXAM: CT CHEST, ABDOMEN, AND PELVIS WITH CONTRAST TECHNIQUE: Multidetector CT imaging of the chest, abdomen and pelvis was performed following the standard protocol during bolus administration of intravenous contrast. CONTRAST:  137m ISOVUE-300 IOPAMIDOL (ISOVUE-300) INJECTION 61% COMPARISON:  06/08/2017 CT chest, abdomen and pelvis. FINDINGS: CT CHEST FINDINGS Cardiovascular: Stable top-normal heart size. No significant pericardial effusion/thickening. Right internal jugular MediPort terminates in the lower third of the SVC. Atherosclerotic nonaneurysmal thoracic aorta. Normal caliber pulmonary arteries. No central pulmonary emboli. Mediastinum/Nodes: No discrete thyroid nodules. Unremarkable esophagus. No  pathologically enlarged axillary, mediastinal or hilar lymph nodes. Lungs/Pleura: No pneumothorax. No pleural effusion. Irregular anterior right lower lobe 2.3 x 1.7 cm pulmonary nodule (series 7/image 84), mildly increased from 2.0 x 1.3 cm. Numerous (greater than 10) irregular subsolid pulmonary nodules scattered throughout both lungs, all mildly increased in size. Right upper lobe 9 mm nodule (series 7/image 45), increased from 6 mm. Right middle lobe 7 mm nodule (series 7/image 77), increased from 4 mm. Left upper lobe 7 mm nodule (series 7/image 74), increased from 5 mm. Left lower lobe 9 mm nodule (series 7/image 64), increased from 7 mm. Musculoskeletal: Numerous small sclerotic lesions scattered throughout the thoracic skeleton, not appreciably changed. No appreciable new focal osseous lesions. Mild thoracic spondylosis. CT ABDOMEN PELVIS FINDINGS Hepatobiliary: Normal liver size. Subcentimeter hypodense segment 2 left liver lobe lesion is too small to characterize and stable. No new liver lesions. Cholelithiasis. No biliary ductal dilatation. Pancreas: Normal, with no mass or duct dilation. Spleen: Normal size. No mass. Adrenals/Urinary Tract: Normal adrenals. Ectopic malrotated upper left pelvic kidney. Orthotopic right kidney. No renal masses. No hydronephrosis. Stable appearance of the nondistended bladder with top normal bladder wall thickness. Stomach/Bowel: Normal non-distended stomach. Small bowel loops are located entirely in the right abdomen. Duodenal jejunal junction appears to the right of the spine. Normal caliber small bowel with no small bowel wall thickening. Appendix. Normal large bowel with no diverticulosis, large bowel wall thickening or pericolonic fat stranding. Vascular/Lymphatic: Atherosclerotic nonaneurysmal abdominal aorta. Patent portal, splenic, hepatic and renal veins. No pathologically enlarged lymph nodes in the abdomen or pelvis. Reproductive: Grossly normal uterus.  No  adnexal mass. Other: No pneumoperitoneum, ascites or focal fluid collection. Stable tiny superior periumbilical fat containing hernia. Musculoskeletal: Numerous small sclerotic lesions scattered throughout lumbar spine and bilateral pelvic girdle, not appreciably changed. Marked lumbar spondylosis. IMPRESSION: 1. Mild interval growth of primary right lower lobe pulmonary neoplasm. 2. Mild interval growth of numerous bilateral pulmonary metastases. 3. Small sclerotic osseous metastases scattered throughout the skeleton, not appreciably changed. 4. No new sites of metastatic disease. 5. Chronic findings include: Aortic Atherosclerosis (ICD10-I70.0). Cholelithiasis. 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 Electronically Signed   By: JIlona SorrelM.D.   On: 11/09/2017 08:54   Ct Abdomen Pelvis W Contrast  Result Date: 11/09/2017 CLINICAL DATA:  Stage IV right lower lung adenocarcinoma diagnosed 2017 with ongoing chemotherapy, presenting for restaging. EXAM: CT CHEST, ABDOMEN, AND PELVIS WITH CONTRAST TECHNIQUE: Multidetector CT imaging of the chest, abdomen and pelvis was performed following the standard protocol during bolus administration of intravenous contrast. CONTRAST:  1040mISOVUE-300 IOPAMIDOL (ISOVUE-300)  INJECTION 61% COMPARISON:  06/08/2017 CT chest, abdomen and pelvis. FINDINGS: CT CHEST FINDINGS Cardiovascular: Stable top-normal heart size. No significant pericardial effusion/thickening. Right internal jugular MediPort terminates in the lower third of the SVC. Atherosclerotic nonaneurysmal thoracic aorta. Normal caliber pulmonary arteries. No central pulmonary emboli. Mediastinum/Nodes: No discrete thyroid nodules. Unremarkable esophagus. No pathologically enlarged axillary, mediastinal or hilar lymph nodes. Lungs/Pleura: No pneumothorax. No pleural effusion. Irregular anterior right lower lobe 2.3 x 1.7 cm pulmonary nodule (series 7/image 84), mildly  increased from 2.0 x 1.3 cm. Numerous (greater than 10) irregular subsolid pulmonary nodules scattered throughout both lungs, all mildly increased in size. Right upper lobe 9 mm nodule (series 7/image 45), increased from 6 mm. Right middle lobe 7 mm nodule (series 7/image 77), increased from 4 mm. Left upper lobe 7 mm nodule (series 7/image 74), increased from 5 mm. Left lower lobe 9 mm nodule (series 7/image 64), increased from 7 mm. Musculoskeletal: Numerous small sclerotic lesions scattered throughout the thoracic skeleton, not appreciably changed. No appreciable new focal osseous lesions. Mild thoracic spondylosis. CT ABDOMEN PELVIS FINDINGS Hepatobiliary: Normal liver size. Subcentimeter hypodense segment 2 left liver lobe lesion is too small to characterize and stable. No new liver lesions. Cholelithiasis. No biliary ductal dilatation. Pancreas: Normal, with no mass or duct dilation. Spleen: Normal size. No mass. Adrenals/Urinary Tract: Normal adrenals. Ectopic malrotated upper left pelvic kidney. Orthotopic right kidney. No renal masses. No hydronephrosis. Stable appearance of the nondistended bladder with top normal bladder wall thickness. Stomach/Bowel: Normal non-distended stomach. Small bowel loops are located entirely in the right abdomen. Duodenal jejunal junction appears to the right of the spine. Normal caliber small bowel with no small bowel wall thickening. Appendix. Normal large bowel with no diverticulosis, large bowel wall thickening or pericolonic fat stranding. Vascular/Lymphatic: Atherosclerotic nonaneurysmal abdominal aorta. Patent portal, splenic, hepatic and renal veins. No pathologically enlarged lymph nodes in the abdomen or pelvis. Reproductive: Grossly normal uterus.  No adnexal mass. Other: No pneumoperitoneum, ascites or focal fluid collection. Stable tiny superior periumbilical fat containing hernia. Musculoskeletal: Numerous small sclerotic lesions scattered throughout lumbar spine  and bilateral pelvic girdle, not appreciably changed. Marked lumbar spondylosis. IMPRESSION: 1. Mild interval growth of primary right lower lobe pulmonary neoplasm. 2. Mild interval growth of numerous bilateral pulmonary metastases. 3. Small sclerotic osseous metastases scattered throughout the skeleton, not appreciably changed. 4. No new sites of metastatic disease. 5. Chronic findings include: Aortic Atherosclerosis (ICD10-I70.0). Cholelithiasis. 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 Electronically Signed   By: Ilona Sorrel M.D.   On: 11/09/2017 08:54    ASSESSMENT AND PLAN:  This is a very pleasant 72 years old Hispanic female with metastatic non-small cell lung cancer, adenocarcinoma status post induction systemic chemotherapy with carboplatin and Alimta and she is currently on maintenance treatment with single agent Alimta status post 29 cycles. The patient continues to tolerate her maintenance treatment fairly well.  I recommended for her to proceed with cycle #30 today as a schedule. For the back pain I will order MRI of the pelvis to rule out any metastatic disease or destructive lesion in that area.  I also started the patient on Percocet 5/325 mg p.o. every 8 hours as needed for pain.  If the MRI of the pelvis showed any concerning findings to explain her pain, I will refer the patient back to radiation oncology for consideration of palliative radiotherapy. The patient will come back for follow-up visit in 3 weeks for evaluation  before the next cycle of her treatment. She was advised to call immediately if she has any concerning symptoms in the interval. The patient voices understanding of current disease status and treatment options and is in agreement with the current care plan. All questions were answered. The patient knows to call the clinic with any problems, questions or concerns. We can certainly see the patient much sooner if  necessary.  Disclaimer: This note was dictated with voice recognition software. Similar sounding words can inadvertently be transcribed and may not be corrected upon review.

## 2017-12-01 NOTE — Telephone Encounter (Signed)
Printed avs and calender of upcoming appointment. Per 6/24 los

## 2017-12-01 NOTE — Patient Instructions (Signed)
[facility] Discharge Instructions for Patients Receiving Chemotherapy  Today you received the following chemotherapy agents Alimta  To help prevent nausea and vomiting after your treatment, we encourage you to take your nausea medication as directed   If you develop nausea and vomiting that is not controlled by your nausea medication, call the clinic.   BELOW ARE SYMPTOMS THAT SHOULD BE REPORTED IMMEDIATELY:  *FEVER GREATER THAN 100.5 F  *CHILLS WITH OR WITHOUT FEVER  NAUSEA AND VOMITING THAT IS NOT CONTROLLED WITH YOUR NAUSEA MEDICATION  *UNUSUAL SHORTNESS OF BREATH  *UNUSUAL BRUISING OR BLEEDING  TENDERNESS IN MOUTH AND THROAT WITH OR WITHOUT PRESENCE OF ULCERS  *URINARY PROBLEMS  *BOWEL PROBLEMS  UNUSUAL RASH Items with * indicate a potential emergency and should be followed up as soon as possible.  Feel free to call the clinic should you have any questions or concerns. The clinic phone number is [phone].  Please show the [facility] at check-in to the Emergency Department and triage nurse.

## 2017-12-07 ENCOUNTER — Other Ambulatory Visit: Payer: Self-pay | Admitting: Nurse Practitioner

## 2017-12-07 ENCOUNTER — Ambulatory Visit (HOSPITAL_COMMUNITY)
Admission: RE | Admit: 2017-12-07 | Discharge: 2017-12-07 | Disposition: A | Discharge status: Home / Self Care | Payer: Medicare Other | Source: Ambulatory Visit | Attending: Internal Medicine | Admitting: Internal Medicine

## 2017-12-07 DIAGNOSIS — C7951 Secondary malignant neoplasm of bone: Secondary | ICD-10-CM | POA: Insufficient documentation

## 2017-12-07 DIAGNOSIS — M65252 Calcific tendinitis, left thigh: Secondary | ICD-10-CM | POA: Insufficient documentation

## 2017-12-07 DIAGNOSIS — C3491 Malignant neoplasm of unspecified part of right bronchus or lung: Secondary | ICD-10-CM

## 2017-12-07 DIAGNOSIS — M5136 Other intervertebral disc degeneration, lumbar region: Secondary | ICD-10-CM | POA: Insufficient documentation

## 2017-12-07 DIAGNOSIS — C3490 Malignant neoplasm of unspecified part of unspecified bronchus or lung: Secondary | ICD-10-CM | POA: Diagnosis not present

## 2017-12-07 MED ORDER — GADOBENATE DIMEGLUMINE 529 MG/ML IV SOLN
14.0000 mL | Freq: Once | INTRAVENOUS | Status: AC | PRN
  Administered 2017-12-07: 14 mL via INTRAVENOUS

## 2017-12-20 ENCOUNTER — Other Ambulatory Visit: Payer: Self-pay

## 2017-12-20 DIAGNOSIS — C7951 Secondary malignant neoplasm of bone: Secondary | ICD-10-CM

## 2017-12-20 DIAGNOSIS — C3491 Malignant neoplasm of unspecified part of right bronchus or lung: Secondary | ICD-10-CM

## 2017-12-20 MED ORDER — MELOXICAM 15 MG PO TABS: 15.0000 mg | ORAL_TABLET | Freq: Every day | ORAL | 0 refills | Status: DC | PRN

## 2017-12-22 ENCOUNTER — Inpatient Hospital Stay: Payer: Medicare Other

## 2017-12-22 ENCOUNTER — Other Ambulatory Visit: Payer: Self-pay | Admitting: Internal Medicine

## 2017-12-22 ENCOUNTER — Inpatient Hospital Stay (HOSPITAL_BASED_OUTPATIENT_CLINIC_OR_DEPARTMENT_OTHER): Payer: Medicare Other | Admitting: Internal Medicine

## 2017-12-22 ENCOUNTER — Telehealth: Payer: Self-pay | Admitting: Internal Medicine

## 2017-12-22 ENCOUNTER — Inpatient Hospital Stay: Payer: Medicare Other | Attending: Internal Medicine

## 2017-12-22 ENCOUNTER — Encounter: Payer: Self-pay | Admitting: Internal Medicine

## 2017-12-22 VITALS — BP 143/88 | HR 72 | Temp 98.4°F | Resp 18 | Ht 69.0 in | Wt 154.0 lb

## 2017-12-22 DIAGNOSIS — C7951 Secondary malignant neoplasm of bone: Secondary | ICD-10-CM

## 2017-12-22 DIAGNOSIS — C3491 Malignant neoplasm of unspecified part of right bronchus or lung: Secondary | ICD-10-CM

## 2017-12-22 DIAGNOSIS — C3431 Malignant neoplasm of lower lobe, right bronchus or lung: Secondary | ICD-10-CM | POA: Insufficient documentation

## 2017-12-22 DIAGNOSIS — M545 Low back pain: Secondary | ICD-10-CM | POA: Diagnosis not present

## 2017-12-22 DIAGNOSIS — Z5111 Encounter for antineoplastic chemotherapy: Secondary | ICD-10-CM | POA: Diagnosis not present

## 2017-12-22 LAB — COMPREHENSIVE METABOLIC PANEL
ALT: 19 U/L (ref 0–44)
AST: 30 U/L (ref 15–41)
Albumin: 3.4 g/dL — ABNORMAL LOW (ref 3.5–5.0)
Alkaline Phosphatase: 65 U/L (ref 38–126)
Anion gap: 9 (ref 5–15)
BUN: 24 mg/dL — ABNORMAL HIGH (ref 8–23)
CO2: 24 mmol/L (ref 22–32)
Calcium: 9.8 mg/dL (ref 8.9–10.3)
Chloride: 105 mmol/L (ref 98–111)
Creatinine, Ser: 1.14 mg/dL — ABNORMAL HIGH (ref 0.44–1.00)
GFR calc Af Amer: 55 mL/min — ABNORMAL LOW (ref >60)
GFR calc non Af Amer: 48 mL/min — ABNORMAL LOW (ref >60)
Glucose, Bld: 151 mg/dL — ABNORMAL HIGH (ref 70–99)
Potassium: 3.7 mmol/L (ref 3.5–5.1)
Sodium: 138 mmol/L (ref 135–145)
Total Bilirubin: 0.6 mg/dL (ref 0.3–1.2)
Total Protein: 6.7 g/dL (ref 6.5–8.1)

## 2017-12-22 LAB — CBC WITH DIFFERENTIAL/PLATELET
Basophils Absolute: 0 10*3/uL (ref 0.0–0.1)
Basophils Relative: 0 %
Eosinophils Absolute: 0 10*3/uL (ref 0.0–0.5)
Eosinophils Relative: 0 %
HCT: 31.1 % — ABNORMAL LOW (ref 34.8–46.6)
Hemoglobin: 10.2 g/dL — ABNORMAL LOW (ref 11.6–15.9)
Lymphocytes Relative: 10 %
Lymphs Abs: 0.5 10*3/uL — ABNORMAL LOW (ref 0.9–3.3)
MCH: 32.8 pg (ref 25.1–34.0)
MCHC: 32.8 g/dL (ref 31.5–36.0)
MCV: 100 fL (ref 79.5–101.0)
Monocytes Absolute: 0.4 10*3/uL (ref 0.1–0.9)
Monocytes Relative: 8 %
Neutro Abs: 4.3 10*3/uL (ref 1.5–6.5)
Neutrophils Relative %: 82 %
Platelets: 217 10*3/uL (ref 145–400)
RBC: 3.11 MIL/uL — ABNORMAL LOW (ref 3.70–5.45)
RDW: 14.1 % (ref 11.2–14.5)
WBC: 5.2 10*3/uL (ref 3.9–10.3)

## 2017-12-22 MED ORDER — PROCHLORPERAZINE MALEATE 10 MG PO TABS
10.0000 mg | ORAL_TABLET | Freq: Once | ORAL | Status: AC
  Administered 2017-12-22: 10 mg via ORAL

## 2017-12-22 MED ORDER — SODIUM CHLORIDE 0.9% FLUSH
10.0000 mL | INTRAVENOUS | Status: DC | PRN
  Administered 2017-12-22: 10 mL
  Filled 2017-12-22: qty 10

## 2017-12-22 MED ORDER — HEPARIN SOD (PORK) LOCK FLUSH 100 UNIT/ML IV SOLN
500.0000 [IU] | Freq: Once | INTRAVENOUS | Status: AC | PRN
  Administered 2017-12-22: 500 [IU]
  Filled 2017-12-22: qty 5

## 2017-12-22 MED ORDER — PROCHLORPERAZINE MALEATE 10 MG PO TABS
ORAL_TABLET | ORAL | Status: AC
  Filled 2017-12-22: qty 1

## 2017-12-22 MED ORDER — SODIUM CHLORIDE 0.9 % IV SOLN
525.0000 mg/m2 | Freq: Once | INTRAVENOUS | Status: AC
  Administered 2017-12-22: 1000 mg via INTRAVENOUS
  Filled 2017-12-22: qty 40

## 2017-12-22 MED ORDER — ZOLEDRONIC ACID 4 MG/100ML IV SOLN
4.0000 mg | Freq: Once | INTRAVENOUS | Status: AC
  Administered 2017-12-22: 4 mg via INTRAVENOUS
  Filled 2017-12-22: qty 100

## 2017-12-22 MED ORDER — CYANOCOBALAMIN 1000 MCG/ML IJ SOLN
INTRAMUSCULAR | Status: AC
  Filled 2017-12-22: qty 1

## 2017-12-22 MED ORDER — CYANOCOBALAMIN 1000 MCG/ML IJ SOLN
1000.0000 ug | Freq: Once | INTRAMUSCULAR | Status: AC
  Administered 2017-12-22: 1000 ug via INTRAMUSCULAR

## 2017-12-22 MED ORDER — SODIUM CHLORIDE 0.9 % IV SOLN
Freq: Once | INTRAVENOUS | Status: AC
  Administered 2017-12-22: 09:00:00 via INTRAVENOUS

## 2017-12-22 MED ORDER — SODIUM CHLORIDE 0.9% FLUSH
10.0000 mL | INTRAVENOUS | Status: DC | PRN
  Administered 2017-12-22: 10 mL via INTRAVENOUS
  Filled 2017-12-22: qty 10

## 2017-12-22 NOTE — Patient Instructions (Signed)
[facility] Discharge Instructions for Patients Receiving Chemotherapy  Today you received the following chemotherapy agents Alimta. To help prevent nausea and vomiting after your treatment, we encourage you to take your nausea medication as directed.  If you develop nausea and vomiting that is not controlled by your nausea medication, call the clinic.   BELOW ARE SYMPTOMS THAT SHOULD BE REPORTED IMMEDIATELY:  *FEVER GREATER THAN 100.5 F  *CHILLS WITH OR WITHOUT FEVER  NAUSEA AND VOMITING THAT IS NOT CONTROLLED WITH YOUR NAUSEA MEDICATION  *UNUSUAL SHORTNESS OF BREATH  *UNUSUAL BRUISING OR BLEEDING  TENDERNESS IN MOUTH AND THROAT WITH OR WITHOUT PRESENCE OF ULCERS  *URINARY PROBLEMS  *BOWEL PROBLEMS  UNUSUAL RASH Items with * indicate a potential emergency and should be followed up as soon as possible.  Feel free to call the clinic should you have any questions or concerns. The clinic phone number is [phone].  Please show the CHEMO ALERT CARD at check-in to the Emergency Department and triage nurse.  Cyanocobalamin, Vitamin B12 injection What is this medicine? CYANOCOBALAMIN (sye an oh koe BAL a min) is a man made form of vitamin B12. Vitamin B12 is used in the growth of healthy blood cells, nerve cells, and proteins in the body. It also helps with the metabolism of fats and carbohydrates. This medicine is used to treat people who can not absorb vitamin B12. This medicine may be used for other purposes; ask your health care provider or pharmacist if you have questions. COMMON BRAND NAME(S): B-12 Compliance Kit, B-12 Injection Kit, Cyomin, LA-12, Nutri-Twelve, Physicians EZ Use B-12, Primabalt What should I tell my health care provider before I take this medicine? They need to know if you have any of these conditions: -kidney disease -Leber's disease -megaloblastic anemia -an unusual or allergic reaction to cyanocobalamin, cobalt, other medicines, foods,  dyes, or preservatives -pregnant or trying to get pregnant -breast-feeding How should I use this medicine? This medicine is injected into a muscle or deeply under the skin. It is usually given by a health care professional in a clinic or doctor's office. However, your doctor may teach you how to inject yourself. Follow all instructions. Talk to your pediatrician regarding the use of this medicine in children. Special care may be needed. Overdosage: If you think you have taken too much of this medicine contact a poison control center or emergency room at once. NOTE: This medicine is only for you. Do not share this medicine with others. What if I miss a dose? If you are given your dose at a clinic or doctor's office, call to reschedule your appointment. If you give your own injections and you miss a dose, take it as soon as you can. If it is almost time for your next dose, take only that dose. Do not take double or extra doses. What may interact with this medicine? -colchicine -heavy alcohol intake This list may not describe all possible interactions. Give your health care provider a list of all the medicines, herbs, non-prescription drugs, or dietary supplements you use. Also tell them if you smoke, drink alcohol, or use illegal drugs. Some items may interact with your medicine. What should I watch for while using this medicine? Visit your doctor or health care professional regularly. You may need blood work done while you are taking this medicine. You may need to follow a special diet. Talk to your doctor. Limit your alcohol intake and avoid smoking to get the best benefit. What side effects   may I notice from receiving this medicine? Side effects that you should report to your doctor or health care professional as soon as possible: -allergic reactions like skin rash, itching or hives, swelling of the face, lips, or tongue -blue tint to skin -chest tightness, pain -difficulty breathing,  wheezing -dizziness -red, swollen painful area on the leg Side effects that usually do not require medical attention (report to your doctor or health care professional if they continue or are bothersome): -diarrhea -headache This list may not describe all possible side effects. Call your doctor for medical advice about side effects. You may report side effects to FDA at 1-800-FDA-1088. Where should I keep my medicine? Keep out of the reach of children. Store at room temperature between 15 and 30 degrees C (59 and 85 degrees F). Protect from light. Throw away any unused medicine after the expiration date. NOTE: This sheet is a summary. It may not cover all possible information. If you have questions about this medicine, talk to your doctor, pharmacist, or health care provider.  2018 Elsevier/Gold Standard (2007-09-04 22:10:20)  

## 2017-12-22 NOTE — Telephone Encounter (Signed)
Scheduled appt per 7/18 los - pt to get an updated schedule in treatment area.

## 2017-12-22 NOTE — Progress Notes (Signed)
[place] [phone]   Fax:(336) [place], MD [address] [place] [address]  DIAGNOSIS: Stage IV (T1b, N2, M1b) non-small cell lung cancer, adenocarcinoma diagnosed in March 2017 and presented with right lower lobe lung nodule in addition to mediastinal lymphadenopathy and metastatic bone lesions.  Molecular studies: PDL 1 TPS  <1%.   Foundation One Studies: Positive for ERBB2 [id number]. Negative for EGFR, KRAS, ALK, BRAF, MET, RET and ROS1.  PRIOR THERAPY:  1) Induction systemic chemotherapy with carboplatin for AUC of 5 and Alimta 500 MG/M2 every 3 weeks is status post 6 cycles at the [facility], last dose was given 03/05/2016 with stable disease. 2) palliative radiation to the metastatic bone disease in the lower back and pelvic area.  CURRENT THERAPY::  1)  Maintenance systemic chemotherapy with Alimta 500 MG/M2 every 3 weeks status post 30 cycles. First dose was given 03/25/2016.  2) Zometa 4 mg IV every 12 week for metastatic bone disease.  INTERVAL HIST Valerie Long 72 y.o. female returns to the clinic today for follow-up visit accompanied by her sister.  The patient is feeling fine today with no complaints except for the persistent low back pain.  She had MRI of the pelvis performed recently that showed a stable metastatic disease in the pelvis but there was fairly severe degenerative disc disease with nerve compression.  The patient denied having any current chest pain, shortness of breath, cough or hemoptysis.  She denied having any fever or chills.  She has no nausea, vomiting, diarrhea or constipation.  She has no recent weight loss or night sweats.  She is here today for evaluation before starting cycle #31 of her treatment.   MEDICAL HISTORY: Past Medical History:  Diagnosis Date  . Adenocarcinoma of right lung, stage 4 ([place]) 2017  . Anemia   . Arthritis   .  Bone metastases ([place]) 02/24/2017  . Encounter for antineoplastic chemotherapy 03/17/2016  . Hypercholesterolemia   . Hypertension 06/18/2016  . Osteopenia   . 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 Pneumonia   . Shortness of breath dyspnea     ALLERGIES:  has No Known Allergies.  MEDICATIONS:  Current Outpatient Medications  Medication Sig Dispense Refill  . acetaminophen (TYLENOL) 500 MG tablet Take 1,000 mg by mouth every 6 (six) hours as needed for moderate pain or headache.    . baclofen (LIORESAL) 10 MG tablet Take 1 tablet (10 mg total) by mouth 3 (three) times daily. 30 each 0  . Calcium Carb-Cholecalciferol (CALCIUM 600/VITAMIN D3 PO) Take by mouth.    . Cyanocobalamin (B-12 PO) Take 2 tablets by mouth daily.    Marland Kitchen dexamethasone (DECADRON) 4 MG tablet Take 27m by mouth twice daily the day before, of, and after chemo 40 tablet 1  . Fish Oil-Cholecalciferol (OMEGA-3 + VITAMIN D3 PO) Take 1 tablet by mouth daily.    . folic acid (FOLVITE) 1 MG tablet Take 1 tablet (1 mg total) by mouth daily. 90 tablet 1  . ketorolac (ACULAR) 0.5 % ophthalmic solution Place 1 drop into both eyes 4 (four) times daily.  0  . lidocaine-prilocaine (EMLA) cream Apply 1 application topically as needed. Apply 1-2 tsp over port site 1-2 hours prior to chemo. 30 g 0  . meloxicam (MOBIC) 15 MG tablet Take 1 tablet (15 mg total) by mouth daily as  needed for pain. 30 tablet 0  . ofloxacin (OCUFLOX) 0.3 % ophthalmic solution Place 1 drop into both eyes 4 (four) times daily.  0  . ondansetron (ZOFRAN) 8 MG tablet Take 8 mg by mouth every 8 (eight) hours as needed.    Marland Kitchen oxyCODONE-acetaminophen (PERCOCET/ROXICET) 5-325 MG tablet Take 1 tablet by mouth every 8 (eight) hours as needed for severe pain. 30 tablet 0  . prednisoLONE acetate (PRED FORTE) 1 % ophthalmic suspension Place 1 drop into both eyes 4 (four) times daily.  0  . PRESCRIPTION MEDICATION Inject 1 Dose as directed every 21 ( twenty-one)  days. Chemo     No current facility-administered medications for this visit.     SURGICAL HISTORY:  Past Surgical History:  Procedure Laterality Date  . CESAREAN SECTION     myomectomy  . COLONOSCOPY W/ POLYPECTOMY    . IR GENERIC HISTORICAL  08/17/2016   IR US GUIDE VASC ACCESS RIGHT 08/17/2016 [name], MD [facility]  . IR GENERIC HISTORICAL  08/17/2016   IR FLUORO GUIDE PORT INSERTION RIGHT 08/17/2016 [name], MD [facility]  . MEDIASTINOSCOPY N/A 09/25/2015   Procedure: MEDIASTINOSCOPY;  Surgeon: Melrose Nakayama, MD;  Location: [place];  Service: Thoracic;  Laterality: N/A;  . VIDEO BRONCHOSCOPY Bilateral 08/19/2015   Procedure: VIDEO BRONCHOSCOPY WITH FLUORO;  Surgeon: Rigoberto Noel, MD;  Location: [place];  Service: Cardiopulmonary;  Laterality: Bilateral;  . VIDEO BRONCHOSCOPY N/A 09/08/2015   Procedure: VIDEO BRONCHOSCOPY WITH FLUORO;  Surgeon: Rigoberto Noel, MD;  Location: [place];  Service: Thoracic;  Laterality: N/A;  . VIDEO BRONCHOSCOPY WITH ENDOBRONCHIAL ULTRASOUND Right 09/08/2015   Procedure: ATTEMPTED VIDEO BRONCHOSCOPY ENDOBRONCHIAL ULTRASOUND  ;  Surgeon: Rigoberto Noel, MD;  Location: [place];  Service: Thoracic;  Laterality: Right;    REVIEW OF SYSTEMS:  A comprehensive review of systems was negative except for: Musculoskeletal: positive for back pain   PHYSICAL EXAMINATION: General appearance: alert, cooperative and no distress Head: Normocephalic, without obvious abnormality, atraumatic Neck: no adenopathy, no JVD, supple, symmetrical, trachea midline and thyroid not enlarged, symmetric, no tenderness/mass/nodules Lymph nodes: Cervical, supraclavicular, and axillary nodes normal. Resp: clear to auscultation bilaterally Back: symmetric, no curvature. ROM normal. No CVA tenderness. Cardio: regular rate and rhythm, S1, S2 normal, no murmur, click, rub or gallop GI: soft, non-tender; bowel sounds normal; no masses,  no organomegaly Extremities:  extremities normal, atraumatic, no cyanosis or edema  ECOG PERFORMANCE STATUS: 1 - Symptomatic but completely ambulatory  Blood pressure (!) 143/88, pulse 72, temperature 98.4 F (36.9 C), temperature source Oral, resp. rate 18, height '5\' 9"'$  (1.753 m), weight 154 lb (69.9 kg), last menstrual period 06/09/1998, SpO2 100 %.  LABORATORY DATA: Lab Results  Component Value Date   WBC 5.2 12/22/2017   HGB 10.2 (L) 12/22/2017   HCT 31.1 (L) 12/22/2017   MCV 100.0 12/22/2017   PLT 217 12/22/2017      Chemistry      Component Value Date/Time   NA 136 12/01/2017 0848   NA 137 06/09/2017 0901   K 3.9 12/01/2017 0848   K 3.9 06/09/2017 0901   CL 103 12/01/2017 0848   CO2 26 12/01/2017 0848   CO2 25 06/09/2017 0901   BUN 23 12/01/2017 0848   BUN 19.0 06/09/2017 0901   CREATININE 1.12 (H) 12/01/2017 0848   CREATININE 1.2 (H) 06/09/2017 0901   GLU 170 01/23/2016      Component Value Date/Time   CALCIUM 10.0 12/01/2017  0848   CALCIUM 9.9 06/09/2017 0901   ALKPHOS 67 12/01/2017 0848   ALKPHOS 63 06/09/2017 0901   AST 32 12/01/2017 0848   AST 35 (H) 06/09/2017 0901   ALT 20 12/01/2017 0848   ALT 23 06/09/2017 0901   BILITOT 0.6 12/01/2017 0848   BILITOT 0.65 06/09/2017 0901       RADIOGRAPHIC STUDIES: Mr Pelvis W Wo Contrast  Result Date: 12/09/2017 CLINICAL DATA:  Metastatic lung cancer. Low back pain and right leg pain. Known bone metastases. EXAM: MRI PELVIS WITHOUT AND WITH CONTRAST TECHNIQUE: Multiplanar multisequence MR imaging of the pelvis was performed both before and after administration of intravenous contrast. CONTRAST:  21m MULTIHANCE GADOBENATE DIMEGLUMINE 529 MG/ML IV SOLN COMPARISON:  CT scans dated 11/08/2017 FINDINGS: Musculoskeletal: There are multiple small blastic metastases in the pelvic bones as previously demonstrated. There are no destructive lesions. No fractures. No pathologic enhancement of these lesions after contrast administration. The patient has  fairly severe degenerative disc disease in the lower lumbar spine with a lumbar scoliosis. There is compression of the right side of the thecal sac at L3-4 which could affect the right L4 nerve. There is compression of the left side of the thecal sac at L4-5 which could affect the left L5 nerve. The patient has prominent calcific tendinitis of the distal left gluteus medius tendon at the level of the left greater trochanter. There is enhancement in the distal gluteus medius muscle and tendon and around the calcification. There are minimal degenerative changes of both hips with small bilateral hip effusions. No bursitis. Urinary Tract: Left pelvic kidney as previously demonstrated. Otherwise negative. Bowel:  Unremarkable visualized pelvic bowel loops. Vascular/Lymphatic: No pathologically enlarged lymph nodes. No significant vascular abnormality seen. Reproductive:  No mass or other significant abnormality Other:  None IMPRESSION: 1. Stable blastic metastases of the pelvis. No new metastases, destructive lesions, or fractures. 2. Fairly severe degenerative disc disease at L3-4 and L4-5 areas of potential neural impingement at L3-4 on the right and L4-5 on the left. 3. Calcific tendinitis of the distal left gluteus medius tendon at the left greater trochanter. Electronically Signed   By: JLorriane ShireM.D.   On: 12/09/2017 09:22    ASSESSMENT AND PLAN:  This is a very pleasant 72years old Hispanic female with metastatic non-small cell lung cancer, adenocarcinoma status post induction systemic chemotherapy with carboplatin and Alimta and she is currently on maintenance treatment with single agent Alimta status post 30 cycles. The patient continues to tolerate her maintenance treatment with Alimta fairly well. I recommended for her to proceed with cycle #31 today. For the back pain this is likely secondary to degenerative disc disease, she will continue with her current pain medication. I will see her back  for follow-up visit in 3 weeks for evaluation before the next cycle of her treatment. She was advised to call immediately if she has any concerning symptoms in the interval. The patient voices understanding of current disease status and treatment options and is in agreement with the current care plan. All questions were answered. The patient knows to call the clinic with any problems, questions or concerns. We can certainly see the patient much sooner if necessary.  Disclaimer: This note was dictated with voice recognition software. Similar sounding words can inadvertently be transcribed and may not be corrected upon review.

## 2017-12-29 ENCOUNTER — Telehealth: Payer: Self-pay | Admitting: Internal Medicine

## 2017-12-29 NOTE — Telephone Encounter (Signed)
Tried to call regarding 8/29

## 2018-01-12 ENCOUNTER — Encounter: Payer: Self-pay | Admitting: Internal Medicine

## 2018-01-12 ENCOUNTER — Inpatient Hospital Stay: Payer: Medicare Other

## 2018-01-12 ENCOUNTER — Telehealth: Payer: Self-pay

## 2018-01-12 ENCOUNTER — Inpatient Hospital Stay: Payer: Medicare Other | Attending: Internal Medicine

## 2018-01-12 ENCOUNTER — Inpatient Hospital Stay (HOSPITAL_BASED_OUTPATIENT_CLINIC_OR_DEPARTMENT_OTHER): Payer: Medicare Other | Admitting: Internal Medicine

## 2018-01-12 VITALS — BP 137/79 | HR 63 | Temp 97.7°F | Resp 17 | Ht 69.0 in | Wt 154.8 lb

## 2018-01-12 DIAGNOSIS — D6481 Anemia due to antineoplastic chemotherapy: Secondary | ICD-10-CM

## 2018-01-12 DIAGNOSIS — Z5111 Encounter for antineoplastic chemotherapy: Secondary | ICD-10-CM | POA: Insufficient documentation

## 2018-01-12 DIAGNOSIS — C3490 Malignant neoplasm of unspecified part of unspecified bronchus or lung: Secondary | ICD-10-CM

## 2018-01-12 DIAGNOSIS — C7951 Secondary malignant neoplasm of bone: Secondary | ICD-10-CM | POA: Diagnosis not present

## 2018-01-12 DIAGNOSIS — C3491 Malignant neoplasm of unspecified part of right bronchus or lung: Secondary | ICD-10-CM

## 2018-01-12 DIAGNOSIS — M545 Low back pain: Secondary | ICD-10-CM

## 2018-01-12 DIAGNOSIS — Z79899 Other long term (current) drug therapy: Secondary | ICD-10-CM | POA: Diagnosis not present

## 2018-01-12 DIAGNOSIS — C3431 Malignant neoplasm of lower lobe, right bronchus or lung: Secondary | ICD-10-CM

## 2018-01-12 DIAGNOSIS — Z95828 Presence of other vascular implants and grafts: Secondary | ICD-10-CM

## 2018-01-12 DIAGNOSIS — T451X5A Adverse effect of antineoplastic and immunosuppressive drugs, initial encounter: Secondary | ICD-10-CM

## 2018-01-12 LAB — CBC WITH DIFFERENTIAL/PLATELET
Basophils Absolute: 0 10*3/uL (ref 0.0–0.1)
Basophils Relative: 0 %
Eosinophils Absolute: 0 10*3/uL (ref 0.0–0.5)
Eosinophils Relative: 0 %
HCT: 30.5 % — ABNORMAL LOW (ref 34.8–46.6)
Hemoglobin: 10.1 g/dL — ABNORMAL LOW (ref 11.6–15.9)
Lymphocytes Relative: 13 %
Lymphs Abs: 0.6 10*3/uL — ABNORMAL LOW (ref 0.9–3.3)
MCH: 33 pg (ref 25.1–34.0)
MCHC: 33.1 g/dL (ref 31.5–36.0)
MCV: 99.7 fL (ref 79.5–101.0)
Monocytes Absolute: 0.3 10*3/uL (ref 0.1–0.9)
Monocytes Relative: 7 %
Neutro Abs: 3.6 10*3/uL (ref 1.5–6.5)
Neutrophils Relative %: 80 %
Platelets: 232 10*3/uL (ref 145–400)
RBC: 3.06 MIL/uL — ABNORMAL LOW (ref 3.70–5.45)
RDW: 14 % (ref 11.2–14.5)
WBC: 4.5 10*3/uL (ref 3.9–10.3)

## 2018-01-12 LAB — COMPREHENSIVE METABOLIC PANEL
ALT: 15 U/L (ref 0–44)
AST: 28 U/L (ref 15–41)
Albumin: 3.5 g/dL (ref 3.5–5.0)
Alkaline Phosphatase: 60 U/L (ref 38–126)
Anion gap: 10 (ref 5–15)
BUN: 19 mg/dL (ref 8–23)
CO2: 24 mmol/L (ref 22–32)
Calcium: 9.4 mg/dL (ref 8.9–10.3)
Chloride: 103 mmol/L (ref 98–111)
Creatinine, Ser: 1.08 mg/dL — ABNORMAL HIGH (ref 0.44–1.00)
GFR calc Af Amer: 59 mL/min — ABNORMAL LOW (ref >60)
GFR calc non Af Amer: 51 mL/min — ABNORMAL LOW (ref >60)
Glucose, Bld: 116 mg/dL — ABNORMAL HIGH (ref 70–99)
Potassium: 4.2 mmol/L (ref 3.5–5.1)
Sodium: 137 mmol/L (ref 135–145)
Total Bilirubin: 0.6 mg/dL (ref 0.3–1.2)
Total Protein: 6.8 g/dL (ref 6.5–8.1)

## 2018-01-12 MED ORDER — HEPARIN SOD (PORK) LOCK FLUSH 100 UNIT/ML IV SOLN
500.0000 [IU] | Freq: Once | INTRAVENOUS | Status: AC | PRN
  Administered 2018-01-12: 500 [IU]
  Filled 2018-01-12: qty 5

## 2018-01-12 MED ORDER — CYANOCOBALAMIN 1000 MCG/ML IJ SOLN: 1000.0000 ug | Freq: Once | INTRAMUSCULAR | Status: DC

## 2018-01-12 MED ORDER — PROCHLORPERAZINE MALEATE 10 MG PO TABS
ORAL_TABLET | ORAL | Status: AC
  Filled 2018-01-12: qty 1

## 2018-01-12 MED ORDER — SODIUM CHLORIDE 0.9 % IV SOLN
Freq: Once | INTRAVENOUS | Status: AC
  Administered 2018-01-12: 11:00:00 via INTRAVENOUS
  Filled 2018-01-12: qty 250

## 2018-01-12 MED ORDER — SODIUM CHLORIDE 0.9% FLUSH
10.0000 mL | Freq: Once | INTRAVENOUS | Status: AC
  Administered 2018-01-12: 10 mL
  Filled 2018-01-12: qty 10

## 2018-01-12 MED ORDER — SODIUM CHLORIDE 0.9 % IV SOLN
1000.0000 mg | Freq: Once | INTRAVENOUS | Status: AC
  Administered 2018-01-12: 1000 mg via INTRAVENOUS
  Filled 2018-01-12: qty 40

## 2018-01-12 MED ORDER — PROCHLORPERAZINE MALEATE 10 MG PO TABS
10.0000 mg | ORAL_TABLET | Freq: Once | ORAL | Status: AC
  Administered 2018-01-12: 10 mg via ORAL

## 2018-01-12 MED ORDER — SODIUM CHLORIDE 0.9% FLUSH
10.0000 mL | INTRAVENOUS | Status: DC | PRN
  Administered 2018-01-12: 10 mL
  Filled 2018-01-12: qty 10

## 2018-01-12 NOTE — Patient Instructions (Signed)
[facility] Discharge Instructions for Patients Receiving Chemotherapy  Today you received the following chemotherapy agents Alimta. To help prevent nausea and vomiting after your treatment, we encourage you to take your nausea medication as directed.  If you develop nausea and vomiting that is not controlled by your nausea medication, call the clinic.   BELOW ARE SYMPTOMS THAT SHOULD BE REPORTED IMMEDIATELY:  *FEVER GREATER THAN 100.5 F  *CHILLS WITH OR WITHOUT FEVER  NAUSEA AND VOMITING THAT IS NOT CONTROLLED WITH YOUR NAUSEA MEDICATION  *UNUSUAL SHORTNESS OF BREATH  *UNUSUAL BRUISING OR BLEEDING  TENDERNESS IN MOUTH AND THROAT WITH OR WITHOUT PRESENCE OF ULCERS  *URINARY PROBLEMS  *BOWEL PROBLEMS  UNUSUAL RASH Items with * indicate a potential emergency and should be followed up as soon as possible.  Feel free to call the clinic should you have any questions or concerns. The clinic phone number is [phone].  Please show the [facility] at check-in to the Emergency Department and triage nurse.  Cyanocobalamin, Vitamin B12 injection What is this medicine? CYANOCOBALAMIN (sye an oh koe BAL a min) is a man made form of vitamin B12. Vitamin B12 is used in the growth of healthy blood cells, nerve cells, and proteins in the body. It also helps with the metabolism of fats and carbohydrates. This medicine is used to treat people who can not absorb vitamin B12. This medicine may be used for other purposes; ask your health care provider or pharmacist if you have questions. COMMON BRAND NAME(S): B-12 Compliance Kit, B-12 Injection Kit, Cyomin, LA-12, Nutri-Twelve, Physicians EZ Use B-12, Primabalt What should I tell my health care provider before I take this medicine? They need to know if you have any of these conditions: -kidney disease -Leber's disease -megaloblastic anemia -an unusual or allergic reaction to cyanocobalamin, cobalt, other medicines, foods,  dyes, or preservatives -pregnant or trying to get pregnant -breast-feeding How should I use this medicine? This medicine is injected into a muscle or deeply under the skin. It is usually given by a health care professional in a clinic or doctor's office. However, your doctor may teach you how to inject yourself. Follow all instructions. Talk to your pediatrician regarding the use of this medicine in children. Special care may be needed. Overdosage: If you think you have taken too much of this medicine contact a poison control center or emergency room at once. NOTE: This medicine is only for you. Do not share this medicine with others. What if I miss a dose? If you are given your dose at a clinic or doctor's office, call to reschedule your appointment. If you give your own injections and you miss a dose, take it as soon as you can. If it is almost time for your next dose, take only that dose. Do not take double or extra doses. What may interact with this medicine? -colchicine -heavy alcohol intake This list may not describe all possible interactions. Give your health care provider a list of all the medicines, herbs, non-prescription drugs, or dietary supplements you use. Also tell them if you smoke, drink alcohol, or use illegal drugs. Some items may interact with your medicine. What should I watch for while using this medicine? Visit your doctor or health care professional regularly. You may need blood work done while you are taking this medicine. You may need to follow a special diet. Talk to your doctor. Limit your alcohol intake and avoid smoking to get the best benefit. What side effects  may I notice from receiving this medicine? Side effects that you should report to your doctor or health care professional as soon as possible: -allergic reactions like skin rash, itching or hives, swelling of the face, lips, or tongue -blue tint to skin -chest tightness, pain -difficulty breathing,  wheezing -dizziness -red, swollen painful area on the leg Side effects that usually do not require medical attention (report to your doctor or health care professional if they continue or are bothersome): -diarrhea -headache This list may not describe all possible side effects. Call your doctor for medical advice about side effects. You may report side effects to FDA at 1-800-FDA-1088. Where should I keep my medicine? Keep out of the reach of children. Store at room temperature between 15 and 30 degrees C (59 and 85 degrees F). Protect from light. Throw away any unused medicine after the expiration date. NOTE: This sheet is a summary. It may not cover all possible information. If you have questions about this medicine, talk to your doctor, pharmacist, or health care provider.  2018 Elsevier/Gold Standard (2007-09-04 22:10:20)  

## 2018-01-12 NOTE — Progress Notes (Signed)
[place] Telephone:(336) [phone]   Fax:(336) [place], MD [place] [address] [place] [address]  DIAGNOSIS: Stage IV (T1b, N2, M1b) non-small cell lung cancer, adenocarcinoma diagnosed in March 2017 and presented with right lower lobe lung nodule in addition to mediastinal lymphadenopathy and metastatic bone lesions.  Molecular studies: PDL 1 TPS  <1%.   Foundation One Studies: Positive for ERBB2 [id number]_[id number]. Negative for EGFR, KRAS, ALK, BRAF, MET, RET and ROS1.  PRIOR THERAPY:  1) Induction systemic chemotherapy with carboplatin for AUC of 5 and Alimta 500 MG/M2 every 3 weeks is status post 6 cycles at the [facility], last dose was given 03/05/2016 with stable disease. 2) palliative radiation to the metastatic bone disease in the lower back and pelvic area.  CURRENT THERAPY::  1)  Maintenance systemic chemotherapy with Alimta 500 MG/M2 every 3 weeks status post 31 cycles. First dose was given 03/25/2016.  2) Zometa 4 mg IV every 12 week for metastatic bone disease.  INTERVAL HIST Valerie Long 72 y.o. female returns to the clinic today for follow-up visit accompanied by her sister.  The patient is feeling fine today with no concerning complaints except for the intermittent low back pain.  She denied having any recent weight loss or night sweats.  She has no nausea, vomiting, diarrhea or constipation.  She denied having any fever or chills.  She has no chest pain, shortness breath, cough or hemoptysis.  She denied having any headache or visual changes.  She is here today for evaluation before starting cycle #32 of her treatment.  MEDICAL HISTORY: Past Medical History:  Diagnosis Date  . Adenocarcinoma of right lung, stage 4 ([place]) 2017  . Anemia   . Arthritis   . Bone metastases ([place]) 02/24/2017  . Encounter for antineoplastic chemotherapy 03/17/2016  . Hypercholesterolemia   . Hypertension  06/18/2016  . Osteopenia   . 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 Pneumonia   . Shortness of breath dyspnea     ALLERGIES:  has No Known Allergies.  MEDICATIONS:  Current Outpatient Medications  Medication Sig Dispense Refill  . acetaminophen (TYLENOL) 500 MG tablet Take 1,000 mg by mouth every 6 (six) hours as needed for moderate pain or headache.    . baclofen (LIORESAL) 10 MG tablet Take 1 tablet (10 mg total) by mouth 3 (three) times daily. 30 each 0  . Calcium Carb-Cholecalciferol (CALCIUM 600/VITAMIN D3 PO) Take by mouth.    . Cyanocobalamin (B-12 PO) Take 2 tablets by mouth daily.    Marland Kitchen dexamethasone (DECADRON) 4 MG tablet Take 69m by mouth twice daily the day before, of, and after chemo 40 tablet 1  . Fish Oil-Cholecalciferol (OMEGA-3 + VITAMIN D3 PO) Take 1 tablet by mouth daily.    . folic acid (FOLVITE) 1 MG tablet Take 1 tablet (1 mg total) by mouth daily. 90 tablet 1  . ketorolac (ACULAR) 0.5 % ophthalmic solution Place 1 drop into both eyes 4 (four) times daily.  0  . lidocaine-prilocaine (EMLA) cream Apply 1 application topically as needed. Apply 1-2 tsp over port site 1-2 hours prior to chemo. 30 g 0  . meloxicam (MOBIC) 15 MG tablet Take 1 tablet (15 mg total) by mouth daily as needed for pain. 30 tablet 0  . ofloxacin (OCUFLOX) 0.3 % ophthalmic solution Place 1 drop into both eyes 4 (four) times  daily.  0  . ondansetron (ZOFRAN) 8 MG tablet Take 8 mg by mouth every 8 (eight) hours as needed.    Marland Kitchen oxyCODONE-acetaminophen (PERCOCET/ROXICET) 5-325 MG tablet Take 1 tablet by mouth every 8 (eight) hours as needed for severe pain. 30 tablet 0  . prednisoLONE acetate (PRED FORTE) 1 % ophthalmic suspension Place 1 drop into both eyes 4 (four) times daily.  0  . PRESCRIPTION MEDICATION Inject 1 Dose as directed every 21 ( twenty-one) days. Chemo     No current facility-administered medications for this visit.     SURGICAL HISTORY:  Past Surgical History:    Procedure Laterality Date  . CESAREAN SECTION     myomectomy  . COLONOSCOPY W/ POLYPECTOMY    . IR GENERIC HISTORICAL  08/17/2016   IR US GUIDE VASC ACCESS RIGHT 08/17/2016 [name], MD [facility]  . IR GENERIC HISTORICAL  08/17/2016   IR FLUORO GUIDE PORT INSERTION RIGHT 08/17/2016 [name], MD [facility]  . MEDIASTINOSCOPY N/A 09/25/2015   Procedure: MEDIASTINOSCOPY;  Surgeon: Melrose Nakayama, MD;  Location: [facility];  Service: Thoracic;  Laterality: N/A;  . VIDEO BRONCHOSCOPY Bilateral 08/19/2015   Procedure: VIDEO BRONCHOSCOPY WITH FLUORO;  Surgeon: Rigoberto Noel, MD;  Location: [facility];  Service: Cardiopulmonary;  Laterality: Bilateral;  . VIDEO BRONCHOSCOPY N/A 09/08/2015   Procedure: VIDEO BRONCHOSCOPY WITH FLUORO;  Surgeon: Rigoberto Noel, MD;  Location: [facility];  Service: Thoracic;  Laterality: N/A;  . VIDEO BRONCHOSCOPY WITH ENDOBRONCHIAL ULTRASOUND Right 09/08/2015   Procedure: ATTEMPTED VIDEO BRONCHOSCOPY ENDOBRONCHIAL ULTRASOUND  ;  Surgeon: Rigoberto Noel, MD;  Location: [facility];  Service: Thoracic;  Laterality: Right;    REVIEW OF SYSTEMS:  A comprehensive review of systems was negative except for: Musculoskeletal: positive for back pain   PHYSICAL EXAMINATION: General appearance: alert, cooperative and no distress Head: Normocephalic, without obvious abnormality, atraumatic Neck: no adenopathy, no JVD, supple, symmetrical, trachea midline and thyroid not enlarged, symmetric, no tenderness/mass/nodules Lymph nodes: Cervical, supraclavicular, and axillary nodes normal. Resp: clear to auscultation bilaterally Back: symmetric, no curvature. ROM normal. No CVA tenderness. Cardio: regular rate and rhythm, S1, S2 normal, no murmur, click, rub or gallop GI: soft, non-tender; bowel sounds normal; no masses,  no organomegaly Extremities: extremities normal, atraumatic, no cyanosis or edema  ECOG PERFORMANCE STATUS: 1 - Symptomatic but completely ambulatory  Blood  pressure 137/79, pulse 63, temperature 97.7 F (36.5 C), temperature source Oral, resp. rate 17, height '5\' 9"'  (1.753 m), weight 154 lb 12.8 oz (70.2 kg), last menstrual period 06/09/1998, SpO2 100 %.  LABORATORY DATA: Lab Results  Component Value Date   WBC 4.5 01/12/2018   HGB 10.1 (L) 01/12/2018   HCT 30.5 (L) 01/12/2018   MCV 99.7 01/12/2018   PLT 232 01/12/2018      Chemistry      Component Value Date/Time   NA 138 12/22/2017 0807   NA 137 06/09/2017 0901   K 3.7 12/22/2017 0807   K 3.9 06/09/2017 0901   CL 105 12/22/2017 0807   CO2 24 12/22/2017 0807   CO2 25 06/09/2017 0901   BUN 24 (H) 12/22/2017 0807   BUN 19.0 06/09/2017 0901   CREATININE 1.14 (H) 12/22/2017 0807   CREATININE 1.2 (H) 06/09/2017 0901   GLU 170 01/23/2016      Component Value Date/Time   CALCIUM 9.8 12/22/2017 0807   CALCIUM 9.9 06/09/2017 0901   ALKPHOS 65 12/22/2017 0807   ALKPHOS 63 06/09/2017 0901  AST 30 12/22/2017 0807   AST 35 (H) 06/09/2017 0901   ALT 19 12/22/2017 0807   ALT 23 06/09/2017 0901   BILITOT 0.6 12/22/2017 0807   BILITOT 0.65 06/09/2017 0901       RADIOGRAPHIC STUDIES: No results found.  ASSESSMENT AND PLAN:  This is a very pleasant 72 years old Hispanic female with metastatic non-small cell lung cancer, adenocarcinoma status post induction systemic chemotherapy with carboplatin and Alimta and she is currently on maintenance treatment with single agent Alimta status post 31 cycles. The patient continues to tolerate this treatment well with no concerning complaints.  I recommended for her to proceed with cycle #32 today as a schedule. I will see her back for follow-up visit in 3 weeks for evaluation after repeating CT scan of the chest, abdomen and pelvis for restaging of her disease. The patient was advised to call immediately if she has any concerning symptoms in the interval. The patient voices understanding of current disease status and treatment options and is in  agreement with the current care plan. All questions were answered. The patient knows to call the clinic with any problems, questions or concerns. We can certainly see the patient much sooner if necessary.  Disclaimer: This note was dictated with voice recognition software. Similar sounding words can inadvertently be transcribed and may not be corrected upon review.

## 2018-01-12 NOTE — Telephone Encounter (Signed)
Printed avs and calender of upcoming appointment. Per 8/8 los

## 2018-01-18 ENCOUNTER — Telehealth: Payer: Self-pay | Admitting: Internal Medicine

## 2018-01-18 ENCOUNTER — Telehealth: Payer: Self-pay

## 2018-01-18 NOTE — Telephone Encounter (Signed)
[facility] - moved 8/29 f/u from [facility] to [facility] and adjusted remaining appointments. Patient was scheduled for lab/port 8/26 and fu/tx 8/29 pending scan. Lab/port for 8/26 moved to 8/29 ([place]) with fu/tx. No need for decoupling of appointments due to scan as patient has labs drawn routinely every three weeks. Spoke with patient she is aware and understands central radiology will call re scan and next appointment at [facility] is 8/29.

## 2018-01-18 NOTE — Telephone Encounter (Signed)
Per 8/14 moved patient labs, and flush. To th 29th.

## 2018-01-28 ENCOUNTER — Other Ambulatory Visit: Payer: Self-pay | Admitting: Nurse Practitioner

## 2018-01-28 DIAGNOSIS — C3491 Malignant neoplasm of unspecified part of right bronchus or lung: Secondary | ICD-10-CM

## 2018-01-28 DIAGNOSIS — C7951 Secondary malignant neoplasm of bone: Secondary | ICD-10-CM

## 2018-01-30 ENCOUNTER — Other Ambulatory Visit: Payer: Medicare Other

## 2018-02-01 ENCOUNTER — Ambulatory Visit (HOSPITAL_COMMUNITY)
Admission: RE | Admit: 2018-02-01 | Discharge: 2018-02-01 | Disposition: A | Discharge status: Home / Self Care | Payer: Medicare Other | Source: Ambulatory Visit | Attending: Internal Medicine | Admitting: Internal Medicine

## 2018-02-01 DIAGNOSIS — C7951 Secondary malignant neoplasm of bone: Secondary | ICD-10-CM | POA: Insufficient documentation

## 2018-02-01 DIAGNOSIS — C7800 Secondary malignant neoplasm of unspecified lung: Secondary | ICD-10-CM | POA: Diagnosis not present

## 2018-02-01 DIAGNOSIS — C7802 Secondary malignant neoplasm of left lung: Secondary | ICD-10-CM | POA: Diagnosis not present

## 2018-02-01 DIAGNOSIS — Z85118 Personal history of other malignant neoplasm of bronchus and lung: Secondary | ICD-10-CM | POA: Diagnosis not present

## 2018-02-01 DIAGNOSIS — K8020 Calculus of gallbladder without cholecystitis without obstruction: Secondary | ICD-10-CM | POA: Diagnosis not present

## 2018-02-01 DIAGNOSIS — C3490 Malignant neoplasm of unspecified part of unspecified bronchus or lung: Secondary | ICD-10-CM

## 2018-02-01 DIAGNOSIS — C3491 Malignant neoplasm of unspecified part of right bronchus or lung: Secondary | ICD-10-CM | POA: Insufficient documentation

## 2018-02-01 MED ORDER — IOHEXOL 300 MG/ML  SOLN
100.0000 mL | Freq: Once | INTRAMUSCULAR | Status: AC | PRN
  Administered 2018-02-01: 100 mL via INTRAVENOUS

## 2018-02-02 ENCOUNTER — Other Ambulatory Visit: Payer: Medicare Other

## 2018-02-02 ENCOUNTER — Ambulatory Visit: Payer: Medicare Other | Admitting: Internal Medicine

## 2018-02-02 ENCOUNTER — Inpatient Hospital Stay: Payer: Medicare Other

## 2018-02-02 ENCOUNTER — Encounter: Payer: Self-pay | Admitting: Oncology

## 2018-02-02 ENCOUNTER — Inpatient Hospital Stay (HOSPITAL_BASED_OUTPATIENT_CLINIC_OR_DEPARTMENT_OTHER): Payer: Medicare Other | Admitting: Oncology

## 2018-02-02 VITALS — BP 168/80 | HR 76 | Temp 97.7°F | Resp 17 | Ht 69.0 in | Wt 155.9 lb

## 2018-02-02 DIAGNOSIS — C3491 Malignant neoplasm of unspecified part of right bronchus or lung: Secondary | ICD-10-CM

## 2018-02-02 DIAGNOSIS — Z95828 Presence of other vascular implants and grafts: Secondary | ICD-10-CM

## 2018-02-02 DIAGNOSIS — C7951 Secondary malignant neoplasm of bone: Secondary | ICD-10-CM | POA: Diagnosis not present

## 2018-02-02 DIAGNOSIS — C3431 Malignant neoplasm of lower lobe, right bronchus or lung: Secondary | ICD-10-CM | POA: Diagnosis not present

## 2018-02-02 DIAGNOSIS — Z79899 Other long term (current) drug therapy: Secondary | ICD-10-CM

## 2018-02-02 DIAGNOSIS — Z5111 Encounter for antineoplastic chemotherapy: Secondary | ICD-10-CM

## 2018-02-02 DIAGNOSIS — D6481 Anemia due to antineoplastic chemotherapy: Secondary | ICD-10-CM

## 2018-02-02 DIAGNOSIS — T451X5A Adverse effect of antineoplastic and immunosuppressive drugs, initial encounter: Secondary | ICD-10-CM

## 2018-02-02 LAB — CBC WITH DIFFERENTIAL/PLATELET
Basophils Absolute: 0 10*3/uL (ref 0.0–0.1)
Basophils Relative: 0 %
Eosinophils Absolute: 0 10*3/uL (ref 0.0–0.5)
Eosinophils Relative: 0 %
HCT: 30.6 % — ABNORMAL LOW (ref 34.8–46.6)
Hemoglobin: 10.4 g/dL — ABNORMAL LOW (ref 11.6–15.9)
Lymphocytes Relative: 9 %
Lymphs Abs: 0.4 10*3/uL — ABNORMAL LOW (ref 0.9–3.3)
MCH: 33.8 pg (ref 25.1–34.0)
MCHC: 34 g/dL (ref 31.5–36.0)
MCV: 99.5 fL (ref 79.5–101.0)
Monocytes Absolute: 0.2 10*3/uL (ref 0.1–0.9)
Monocytes Relative: 4 %
Neutro Abs: 4.1 10*3/uL (ref 1.5–6.5)
Neutrophils Relative %: 87 %
Platelets: 257 10*3/uL (ref 145–400)
RBC: 3.08 MIL/uL — ABNORMAL LOW (ref 3.70–5.45)
RDW: 14.7 % — ABNORMAL HIGH (ref 11.2–14.5)
WBC: 4.7 10*3/uL (ref 3.9–10.3)

## 2018-02-02 LAB — COMPREHENSIVE METABOLIC PANEL
ALT: 14 U/L (ref 0–44)
AST: 29 U/L (ref 15–41)
Albumin: 3.4 g/dL — ABNORMAL LOW (ref 3.5–5.0)
Alkaline Phosphatase: 69 U/L (ref 38–126)
Anion gap: 9 (ref 5–15)
BUN: 20 mg/dL (ref 8–23)
CO2: 25 mmol/L (ref 22–32)
Calcium: 9.9 mg/dL (ref 8.9–10.3)
Chloride: 104 mmol/L (ref 98–111)
Creatinine, Ser: 1.19 mg/dL — ABNORMAL HIGH (ref 0.44–1.00)
GFR calc Af Amer: 52 mL/min — ABNORMAL LOW (ref >60)
GFR calc non Af Amer: 45 mL/min — ABNORMAL LOW (ref >60)
Glucose, Bld: 154 mg/dL — ABNORMAL HIGH (ref 70–99)
Potassium: 4.1 mmol/L (ref 3.5–5.1)
Sodium: 138 mmol/L (ref 135–145)
Total Bilirubin: 0.4 mg/dL (ref 0.3–1.2)
Total Protein: 6.9 g/dL (ref 6.5–8.1)

## 2018-02-02 MED ORDER — SODIUM CHLORIDE 0.9% FLUSH
10.0000 mL | INTRAVENOUS | Status: DC | PRN
  Administered 2018-02-02: 10 mL
  Filled 2018-02-02: qty 10

## 2018-02-02 MED ORDER — PROCHLORPERAZINE MALEATE 10 MG PO TABS
ORAL_TABLET | ORAL | Status: AC
  Filled 2018-02-02: qty 1

## 2018-02-02 MED ORDER — LIDOCAINE-PRILOCAINE 2.5-2.5 % EX CREA: 1.0000 "application " | TOPICAL_CREAM | CUTANEOUS | 0 refills | Status: DC | PRN

## 2018-02-02 MED ORDER — PROCHLORPERAZINE MALEATE 10 MG PO TABS
10.0000 mg | ORAL_TABLET | Freq: Once | ORAL | Status: AC
  Administered 2018-02-02: 10 mg via ORAL

## 2018-02-02 MED ORDER — SODIUM CHLORIDE 0.9 % IV SOLN
Freq: Once | INTRAVENOUS | Status: AC
  Administered 2018-02-02: 10:00:00 via INTRAVENOUS
  Filled 2018-02-02: qty 250

## 2018-02-02 MED ORDER — SODIUM CHLORIDE 0.9 % IV SOLN
530.0000 mg/m2 | Freq: Once | INTRAVENOUS | Status: AC
  Administered 2018-02-02: 1000 mg via INTRAVENOUS
  Filled 2018-02-02: qty 40

## 2018-02-02 MED ORDER — HEPARIN SOD (PORK) LOCK FLUSH 100 UNIT/ML IV SOLN
500.0000 [IU] | Freq: Once | INTRAVENOUS | Status: AC | PRN
  Administered 2018-02-02: 500 [IU]
  Filled 2018-02-02: qty 5

## 2018-02-02 NOTE — Patient Instructions (Signed)
[facility] Discharge Instructions for Patients Receiving Chemotherapy  Today you received the following chemotherapy agents Alimta   To help prevent nausea and vomiting after your treatment, we encourage you to take your nausea medication as directed.    If you develop nausea and vomiting that is not controlled by your nausea medication, call the clinic.   BELOW ARE SYMPTOMS THAT SHOULD BE REPORTED IMMEDIATELY:  *FEVER GREATER THAN 100.5 F  *CHILLS WITH OR WITHOUT FEVER  NAUSEA AND VOMITING THAT IS NOT CONTROLLED WITH YOUR NAUSEA MEDICATION  *UNUSUAL SHORTNESS OF BREATH  *UNUSUAL BRUISING OR BLEEDING  TENDERNESS IN MOUTH AND THROAT WITH OR WITHOUT PRESENCE OF ULCERS  *URINARY PROBLEMS  *BOWEL PROBLEMS  UNUSUAL RASH Items with * indicate a potential emergency and should be followed up as soon as possible.  Feel free to call the clinic should you have any questions or concerns. The clinic phone number is [phone].  Please show the [facility] at check-in to the Emergency Department and triage nurse.

## 2018-02-02 NOTE — Progress Notes (Signed)
[place], MD [address] [place] [address]  DIAGNOSIS: Stage IV (T1b, N2, M1b) non-small cell lung cancer, adenocarcinoma diagnosed in March 2017 and presented with right lower lobe lung nodule in addition to mediastinal lymphadenopathy and metastatic bone lesions.  Molecular studies:PDL 1 TPS <1%.   [facility] Studies: Positive for ERBB2 [id number]. Negative for EGFR, KRAS, ALK, BRAF, MET, RET and ROS1.  PRIOR THERAPY:  1) Induction systemic chemotherapy with carboplatin for AUC of 5 and Alimta 500 MG/M2 every 3 weeks is status post 6 cycles at the [facility], last dose was given 03/05/2016 with stable disease. 2) palliative radiation to the metastatic bone disease in the lower back and pelvic area.  CURRENT THERAPY::  1)  Maintenance systemic chemotherapy with Alimta 500 MG/M2 every 3 weeks status post 32 cycles. First dose was given 03/25/2016.  2) Zometa 4 mg IV every 12 week for metastatic bone disease.  INTERVAL HISTORY: Valerie Long 72 y.o. female returns for routine follow-up visit accompanied by her sister.  The patient is feeling fine today with no specific complaints except for ongoing intermittent low back pain which is unchanged.  She is using Mobic for this.  She denies fevers and chills.  Denies chest pain, shortness of breath, cough, hemoptysis.  Denies nausea, vomiting, constipation, diarrhea.  Denies recent weight loss or night sweats.  The patient is here for evaluation prior to starting cycle #33 of her treatment and to review her restaging CT scan results.  MEDICAL HISTORY: Past Medical History:  Diagnosis Date  . Adenocarcinoma of right lung, stage 4 ([facility]) 2017  . Anemia   . Arthritis   . Bone metastases ([facility]) 02/24/2017  . Encounter for antineoplastic chemotherapy 03/17/2016  . Hypercholesterolemia   . Hypertension 06/18/2016  . Osteopenia   . 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 Pneumonia   . Shortness of breath dyspnea     ALLERGIES:  has No Known Allergies.  MEDICATIONS:  Current Outpatient Medications  Medication Sig Dispense Refill  . acetaminophen (TYLENOL) 500 MG tablet Take 1,000 mg by mouth every 6 (six) hours as needed for moderate pain or headache.    . baclofen (LIORESAL) 10 MG tablet Take 1 tablet (10 mg total) by mouth 3 (three) times daily. 30 each 0  . Calcium Carb-Cholecalciferol (CALCIUM 600/VITAMIN D3 PO) Take by mouth.    . Cyanocobalamin (B-12 PO) Take 2 tablets by mouth daily.    Marland Kitchen dexamethasone (DECADRON) 4 MG tablet Take 5m by mouth twice daily the day before, of, and after chemo 40 tablet 1  . Fish Oil-Cholecalciferol (OMEGA-3 + VITAMIN D3 PO) Take 1 tablet by mouth daily.    . folic acid (FOLVITE) 1 MG tablet Take 1 tablet (1 mg total) by mouth daily. 90 tablet 1  . ketorolac (ACULAR) 0.5 % ophthalmic solution Place 1 drop into both eyes 4 (four) times daily.  0  . lidocaine-prilocaine (EMLA) cream Apply 1 application topically as needed. Apply 1-2 tsp over port site 1-2 hours prior to chemo. 30 g 0  . meloxicam (MOBIC) 15 MG tablet TAKE 1 TABLET (15 MG TOTAL) BY MOUTH DAILY AS NEEDED FOR PAIN. 30 tablet 0  . ofloxacin (OCUFLOX) 0.3 % ophthalmic solution Place 1 drop into both eyes 4 (four) times daily.  0  . ondansetron (ZOFRAN) 8 MG tablet Take 8 mg by mouth every  8 (eight) hours as needed.    Marland Kitchen oxyCODONE-acetaminophen (PERCOCET/ROXICET) 5-325 MG tablet Take 1 tablet by mouth every 8 (eight) hours as needed for severe pain. 30 tablet 0  . prednisoLONE acetate (PRED FORTE) 1 % ophthalmic suspension Place 1 drop into both eyes 4 (four) times daily.  0  . PRESCRIPTION MEDICATION Inject 1 Dose as directed every 21 ( twenty-one) days. Chemo     No current facility-administered medications for this visit.     SURGICAL HISTORY:  Past Surgical History:  Procedure Laterality Date  . CESAREAN SECTION      myomectomy  . COLONOSCOPY W/ POLYPECTOMY    . IR GENERIC HISTORICAL  08/17/2016   IR US GUIDE VASC ACCESS RIGHT 08/17/2016 [name], MD [facility]  . IR GENERIC HISTORICAL  08/17/2016   IR FLUORO GUIDE PORT INSERTION RIGHT 08/17/2016 [name], MD [facility]  . MEDIASTINOSCOPY N/A 09/25/2015   Procedure: MEDIASTINOSCOPY;  Surgeon: Melrose Nakayama, MD;  Location: [facility];  Service: Thoracic;  Laterality: N/A;  . VIDEO BRONCHOSCOPY Bilateral 08/19/2015   Procedure: VIDEO BRONCHOSCOPY WITH FLUORO;  Surgeon: Rigoberto Noel, MD;  Location: [facility];  Service: Cardiopulmonary;  Laterality: Bilateral;  . VIDEO BRONCHOSCOPY N/A 09/08/2015   Procedure: VIDEO BRONCHOSCOPY WITH FLUORO;  Surgeon: Rigoberto Noel, MD;  Location: [facility];  Service: Thoracic;  Laterality: N/A;  . VIDEO BRONCHOSCOPY WITH ENDOBRONCHIAL ULTRASOUND Right 09/08/2015   Procedure: ATTEMPTED VIDEO BRONCHOSCOPY ENDOBRONCHIAL ULTRASOUND  ;  Surgeon: Rigoberto Noel, MD;  Location: [facility];  Service: Thoracic;  Laterality: Right;    REVIEW OF SYSTEMS:   Review of Systems  Constitutional: Negative for appetite change, chills, fatigue, fever and unexpected weight change.  HENT:   Negative for mouth sores, nosebleeds, sore throat and trouble swallowing.   Eyes: Negative for eye problems and icterus.  Respiratory: Negative for cough, hemoptysis, shortness of breath and wheezing.   Cardiovascular: Negative for chest pain and leg swelling.  Gastrointestinal: Negative for abdominal pain, constipation, diarrhea, nausea and vomiting.  Genitourinary: Negative for bladder incontinence, difficulty urinating, dysuria, frequency and hematuria.   Musculoskeletal: Negative for gait problem, neck pain and neck stiffness.  Positive for intermittent low back pain which is unchanged. Skin: Negative for itching and rash.  Neurological: Negative for dizziness, extremity weakness, gait problem, headaches, light-headedness and seizures.   Hematological: Negative for adenopathy. Does not bruise/bleed easily.  Psychiatric/Behavioral: Negative for confusion, depression and sleep disturbance. The patient is not nervous/anxious.     PHYSICAL EXAMINATION:  Blood pressure (!) 168/80, pulse 76, temperature 97.7 F (36.5 C), temperature source Oral, resp. rate 17, height _0  (1.753 m), weight 155 lb 14.4 oz (70.7 kg), last menstrual period 06/09/1998, SpO2 100 %.  ECOG PERFORMANCE STATUS: 1 - Symptomatic but completely ambulatory  Physical Exam  Constitutional: Oriented to person, place, and time and well-developed, well-nourished, and in no distress. No distress.  HENT:  Head: Normocephalic and atraumatic.  Mouth/Throat: Oropharynx is clear and moist. No oropharyngeal exudate.  Eyes: Conjunctivae are normal. Right eye exhibits no discharge. Left eye exhibits no discharge. No scleral icterus.  Neck: Normal range of motion. Neck supple.  Cardiovascular: Normal rate, regular rhythm, normal heart sounds and intact distal pulses.   Pulmonary/Chest: Effort normal and breath sounds normal. No respiratory distress. No wheezes. No rales.  Abdominal: Soft. Bowel sounds are normal. Exhibits no distension and no mass. There is no tenderness.  Musculoskeletal: Normal range of motion. Exhibits no edema.  Lymphadenopathy:  No cervical adenopathy.  Neurological: Alert and oriented to person, place, and time. Exhibits normal muscle tone. Gait normal. Coordination normal.  Skin: Skin is warm and dry. No rash noted. Not diaphoretic. No erythema. No pallor.  Psychiatric: Mood, memory and judgment normal.  Vitals reviewed.  LABORATORY DATA: Lab Results  Component Value Date   WBC 4.7 02/02/2018   HGB 10.4 (L) 02/02/2018   HCT 30.6 (L) 02/02/2018   MCV 99.5 02/02/2018   PLT 257 02/02/2018      Chemistry      Component Value Date/Time   NA 138 02/02/2018 0826   NA 137 06/09/2017 0901   K 4.1 02/02/2018 0826   K 3.9 06/09/2017 0901    CL 104 02/02/2018 0826   CO2 25 02/02/2018 0826   CO2 25 06/09/2017 0901   BUN 20 02/02/2018 0826   BUN 19.0 06/09/2017 0901   CREATININE 1.19 (H) 02/02/2018 0826   CREATININE 1.2 (H) 06/09/2017 0901   GLU 170 01/23/2016      Component Value Date/Time   CALCIUM 9.9 02/02/2018 0826   CALCIUM 9.9 06/09/2017 0901   ALKPHOS 69 02/02/2018 0826   ALKPHOS 63 06/09/2017 0901   AST 29 02/02/2018 0826   AST 35 (H) 06/09/2017 0901   ALT 14 02/02/2018 0826   ALT 23 06/09/2017 0901   BILITOT 0.4 02/02/2018 0826   BILITOT 0.65 06/09/2017 0901       RADIOGRAPHIC STUDIES:  Ct Chest W Contrast  Result Date: 02/02/2018 CLINICAL DATA:  Patient with history of stage IV non-small cell lung cancer. Follow-up exam. EXAM: CT CHEST, ABDOMEN, AND PELVIS WITH CONTRAST TECHNIQUE: Multidetector CT imaging of the chest, abdomen and pelvis was performed following the standard protocol during bolus administration of intravenous contrast. CONTRAST:  110m OMNIPAQUE IOHEXOL 300 MG/ML  SOLN COMPARISON:  CT CAP 11/08/2017 FINDINGS: CT CHEST FINDINGS Cardiovascular: Right anterior chest wall Port-A-Cath is present with tip terminating in the superior vena cava. Heart is mildly enlarged. Trace fluid superior pericardial recess. Aorta and main pulmonary artery normal in caliber. Mediastinum/Nodes: No enlarged axillary, mediastinal or hilar lymphadenopathy. Normal appearance of the esophagus. Lungs/Pleura: Central airways are patent. Grossly similar anterior right lower lobe irregular nodule measuring 1.8 x 2.4 cm (image 74; series 6), previously 2.3 x 1.7 cm. Multiple bilateral additional pulmonary nodules are grossly similar when compared to prior exam. Reference nodules as follows: Right middle lobe nodule measures 7 mm (image 70; series 6), previously 7 mm, superior segment right lower lobe nodule measures 8 mm (image 40; series 6), previously 7 mm, 9 mm right upper lobe nodule (image 37; series 6), previously measuring  9 mm, 6 mm left upper lobe nodule (image 34; series 6), previously measuring 5 mm, 9 mm left lower lobe nodule (image 57; series 6), previously measuring 9 mm, 7 mm lingular nodule (image 67; series 6), previously measuring 7 mm. No pleural effusion or pneumothorax. Musculoskeletal: Similar-appearing sclerotic lesions throughout the thoracic spine, and ribs, not significantly changed. CT ABDOMEN PELVIS FINDINGS Hepatobiliary: Liver is normal in size and contour. Stable subcentimeter low-attenuation lesion left hepatic lobe, too small to characterize (image 47; series 2). Cholelithiasis. No gallbladder wall thickening. No intrahepatic or extrahepatic biliary ductal dilatation. Pancreas: Unremarkable Spleen: Unremarkable Adrenals/Urinary Tract: Normal adrenal glands. Kidneys enhance symmetrically with contrast. Malrotated inferiorly displaced left kidney. No hydronephrosis. Urinary bladder is unremarkable. Stomach/Bowel: No abnormal bowel wall thickening or evidence for bowel obstruction. No free fluid or free intraperitoneal air. Normal appendix. Small bowel loops  located in the right abdomen. Vascular/Lymphatic: Normal caliber abdominal aorta. Peripheral calcified atherosclerotic plaque. Reproductive: Uterus and adnexal structures are unremarkable. Other: None. Musculoskeletal: Re- demonstrated multiple small sclerotic lesions throughout the lumbar spine and pelvis. Not significantly changed. IMPRESSION: 1. Similar-appearing primary right lower lobe pulmonary neoplasm. 2. Multiple bilateral pulmonary metastasis, the majority which are similar in size. A few of the lesions have grown minimally. 3. Similar-appearing sclerotic osseous metastatic disease. Electronically Signed   By: Lovey Newcomer M.D.   On: 02/02/2018 08:28   Ct Abdomen Pelvis W Contrast  Result Date: 02/02/2018 CLINICAL DATA:  Patient with history of stage IV non-small cell lung cancer. Follow-up exam. EXAM: CT CHEST, ABDOMEN, AND PELVIS WITH  CONTRAST TECHNIQUE: Multidetector CT imaging of the chest, abdomen and pelvis was performed following the standard protocol during bolus administration of intravenous contrast. CONTRAST:  16m OMNIPAQUE IOHEXOL 300 MG/ML  SOLN COMPARISON:  CT CAP 11/08/2017 FINDINGS: CT CHEST FINDINGS Cardiovascular: Right anterior chest wall Port-A-Cath is present with tip terminating in the superior vena cava. Heart is mildly enlarged. Trace fluid superior pericardial recess. Aorta and main pulmonary artery normal in caliber. Mediastinum/Nodes: No enlarged axillary, mediastinal or hilar lymphadenopathy. Normal appearance of the esophagus. Lungs/Pleura: Central airways are patent. Grossly similar anterior right lower lobe irregular nodule measuring 1.8 x 2.4 cm (image 74; series 6), previously 2.3 x 1.7 cm. Multiple bilateral additional pulmonary nodules are grossly similar when compared to prior exam. Reference nodules as follows: Right middle lobe nodule measures 7 mm (image 70; series 6), previously 7 mm, superior segment right lower lobe nodule measures 8 mm (image 40; series 6), previously 7 mm, 9 mm right upper lobe nodule (image 37; series 6), previously measuring 9 mm, 6 mm left upper lobe nodule (image 34; series 6), previously measuring 5 mm, 9 mm left lower lobe nodule (image 57; series 6), previously measuring 9 mm, 7 mm lingular nodule (image 67; series 6), previously measuring 7 mm. No pleural effusion or pneumothorax. Musculoskeletal: Similar-appearing sclerotic lesions throughout the thoracic spine, and ribs, not significantly changed. CT ABDOMEN PELVIS FINDINGS Hepatobiliary: Liver is normal in size and contour. Stable subcentimeter low-attenuation lesion left hepatic lobe, too small to characterize (image 47; series 2). Cholelithiasis. No gallbladder wall thickening. No intrahepatic or extrahepatic biliary ductal dilatation. Pancreas: Unremarkable Spleen: Unremarkable Adrenals/Urinary Tract: Normal adrenal  glands. Kidneys enhance symmetrically with contrast. Malrotated inferiorly displaced left kidney. No hydronephrosis. Urinary bladder is unremarkable. Stomach/Bowel: No abnormal bowel wall thickening or evidence for bowel obstruction. No free fluid or free intraperitoneal air. Normal appendix. Small bowel loops located in the right abdomen. Vascular/Lymphatic: Normal caliber abdominal aorta. Peripheral calcified atherosclerotic plaque. Reproductive: Uterus and adnexal structures are unremarkable. Other: None. Musculoskeletal: Re- demonstrated multiple small sclerotic lesions throughout the lumbar spine and pelvis. Not significantly changed. IMPRESSION: 1. Similar-appearing primary right lower lobe pulmonary neoplasm. 2. Multiple bilateral pulmonary metastasis, the majority which are similar in size. A few of the lesions have grown minimally. 3. Similar-appearing sclerotic osseous metastatic disease. Electronically Signed   By: DLovey NewcomerM.D.   On: 02/02/2018 08:28     ASSESSMENT/PLAN:  Adenocarcinoma of right lung, stage 4 ([facility]) This is a very pleasant 72year old Hispanic female with metastatic non-small cell lung cancer, adenocarcinoma status post induction systemic chemotherapy with carboplatin and Alimta and she is currently on maintenance treatment with single agent Alimta status post 32 cycles. She is tolerating her treatment well overall with no concerning complaints. She had a restaging  CT scan and is here to discuss the results. I discussed CT scan with the patient and her sister which shows overall stable disease.  We discussed that there are several pulmonary nodules that are overall stable but if you have increase in size slightly.  We will continue to watch she is on future scans.  Recommend for her to continue on her current treatment.  She will proceed with cycle #33 of her treatment today as scheduled. The patient will follow-up in 3 weeks for evaluation prior to cycle #34.  The patient  was advised to call immediately if she has any concerning symptoms in the interval. The patient voices understanding of current disease status and treatment options and is in agreement with the current care plan. All questions were answered. The patient knows to call the clinic with any problems, questions or concerns. We can certainly see the patient much sooner if necessary.   No orders of the defined types were placed in this encounter.    Mikey Bussing, DNP, AGPCNP-BC, AOCNP 02/02/18

## 2018-02-02 NOTE — Assessment & Plan Note (Addendum)
This is a very pleasant 72 year old Hispanic female with metastatic non-small cell lung cancer, adenocarcinoma status post induction systemic chemotherapy with carboplatin and Alimta and she is currently on maintenance treatment with single agent Alimta status post 32 cycles. She is tolerating her treatment well overall with no concerning complaints. She had a restaging CT scan and is here to discuss the results. I discussed CT scan with the patient and her sister which shows overall stable disease.  We discussed that there are several pulmonary nodules that are overall stable but if you have increase in size slightly.  We will continue to watch she is on future scans.  Recommend for her to continue on her current treatment.  She will proceed with cycle #33 of her treatment today as scheduled. The patient will follow-up in 3 weeks for evaluation prior to cycle #34.  The patient was advised to call immediately if she has any concerning symptoms in the interval. The patient voices understanding of current disease status and treatment options and is in agreement with the current care plan. All questions were answered. The patient knows to call the clinic with any problems, questions or concerns. We can certainly see the patient much sooner if necessary.

## 2018-02-03 ENCOUNTER — Telehealth: Payer: Self-pay | Admitting: Oncology

## 2018-02-03 NOTE — Telephone Encounter (Signed)
Scheduled appt per 8/29 los - pt to get an updated schedule next visit.

## 2018-02-23 ENCOUNTER — Encounter: Payer: Self-pay | Admitting: *Deleted

## 2018-02-23 ENCOUNTER — Inpatient Hospital Stay: Payer: Medicare Other

## 2018-02-23 ENCOUNTER — Encounter: Payer: Self-pay | Admitting: Internal Medicine

## 2018-02-23 ENCOUNTER — Inpatient Hospital Stay: Payer: Medicare Other | Attending: Internal Medicine

## 2018-02-23 ENCOUNTER — Telehealth: Payer: Self-pay | Admitting: Internal Medicine

## 2018-02-23 ENCOUNTER — Inpatient Hospital Stay (HOSPITAL_BASED_OUTPATIENT_CLINIC_OR_DEPARTMENT_OTHER): Payer: Medicare Other | Admitting: Internal Medicine

## 2018-02-23 VITALS — BP 154/81 | HR 85 | Temp 98.0°F | Resp 18 | Ht 69.0 in | Wt 154.8 lb

## 2018-02-23 DIAGNOSIS — I10 Essential (primary) hypertension: Secondary | ICD-10-CM

## 2018-02-23 DIAGNOSIS — C3431 Malignant neoplasm of lower lobe, right bronchus or lung: Secondary | ICD-10-CM

## 2018-02-23 DIAGNOSIS — M2550 Pain in unspecified joint: Secondary | ICD-10-CM | POA: Insufficient documentation

## 2018-02-23 DIAGNOSIS — Z5111 Encounter for antineoplastic chemotherapy: Secondary | ICD-10-CM | POA: Diagnosis not present

## 2018-02-23 DIAGNOSIS — C7951 Secondary malignant neoplasm of bone: Secondary | ICD-10-CM | POA: Diagnosis not present

## 2018-02-23 DIAGNOSIS — C3491 Malignant neoplasm of unspecified part of right bronchus or lung: Secondary | ICD-10-CM

## 2018-02-23 DIAGNOSIS — Z87891 Personal history of nicotine dependence: Secondary | ICD-10-CM

## 2018-02-23 DIAGNOSIS — Z79899 Other long term (current) drug therapy: Secondary | ICD-10-CM | POA: Diagnosis not present

## 2018-02-23 DIAGNOSIS — Z95828 Presence of other vascular implants and grafts: Secondary | ICD-10-CM

## 2018-02-23 LAB — CBC WITH DIFFERENTIAL/PLATELET
Basophils Absolute: 0 10*3/uL (ref 0.0–0.1)
Basophils Relative: 0 %
Eosinophils Absolute: 0 10*3/uL (ref 0.0–0.5)
Eosinophils Relative: 0 %
HCT: 29.6 % — ABNORMAL LOW (ref 34.8–46.6)
Hemoglobin: 10.1 g/dL — ABNORMAL LOW (ref 11.6–15.9)
Lymphocytes Relative: 10 %
Lymphs Abs: 0.4 10*3/uL — ABNORMAL LOW (ref 0.9–3.3)
MCH: 33.8 pg (ref 25.1–34.0)
MCHC: 34 g/dL (ref 31.5–36.0)
MCV: 99.3 fL (ref 79.5–101.0)
Monocytes Absolute: 0.2 10*3/uL (ref 0.1–0.9)
Monocytes Relative: 4 %
Neutro Abs: 3.8 10*3/uL (ref 1.5–6.5)
Neutrophils Relative %: 86 %
Platelets: 265 10*3/uL (ref 145–400)
RBC: 2.98 MIL/uL — ABNORMAL LOW (ref 3.70–5.45)
RDW: 14.7 % — ABNORMAL HIGH (ref 11.2–14.5)
WBC: 4.4 10*3/uL (ref 3.9–10.3)

## 2018-02-23 LAB — COMPREHENSIVE METABOLIC PANEL
ALT: 12 U/L (ref 0–44)
AST: 28 U/L (ref 15–41)
Albumin: 3.3 g/dL — ABNORMAL LOW (ref 3.5–5.0)
Alkaline Phosphatase: 64 U/L (ref 38–126)
Anion gap: 10 (ref 5–15)
BUN: 23 mg/dL (ref 8–23)
CO2: 23 mmol/L (ref 22–32)
Calcium: 9.7 mg/dL (ref 8.9–10.3)
Chloride: 105 mmol/L (ref 98–111)
Creatinine, Ser: 1.25 mg/dL — ABNORMAL HIGH (ref 0.44–1.00)
GFR calc Af Amer: 49 mL/min — ABNORMAL LOW (ref >60)
GFR calc non Af Amer: 43 mL/min — ABNORMAL LOW (ref >60)
Glucose, Bld: 150 mg/dL — ABNORMAL HIGH (ref 70–99)
Potassium: 3.9 mmol/L (ref 3.5–5.1)
Sodium: 138 mmol/L (ref 135–145)
Total Bilirubin: 0.5 mg/dL (ref 0.3–1.2)
Total Protein: 6.9 g/dL (ref 6.5–8.1)

## 2018-02-23 MED ORDER — PROCHLORPERAZINE MALEATE 10 MG PO TABS
10.0000 mg | ORAL_TABLET | Freq: Once | ORAL | Status: AC
  Administered 2018-02-23: 10 mg via ORAL

## 2018-02-23 MED ORDER — CYANOCOBALAMIN 1000 MCG/ML IJ SOLN
INTRAMUSCULAR | Status: AC
  Filled 2018-02-23: qty 1

## 2018-02-23 MED ORDER — SODIUM CHLORIDE 0.9 % IV SOLN
530.0000 mg/m2 | Freq: Once | INTRAVENOUS | Status: AC
  Administered 2018-02-23: 1000 mg via INTRAVENOUS
  Filled 2018-02-23: qty 40

## 2018-02-23 MED ORDER — SODIUM CHLORIDE 0.9 % IV SOLN
Freq: Once | INTRAVENOUS | Status: AC
  Administered 2018-02-23: 10:00:00 via INTRAVENOUS
  Filled 2018-02-23: qty 250

## 2018-02-23 MED ORDER — PROCHLORPERAZINE MALEATE 10 MG PO TABS
ORAL_TABLET | ORAL | Status: AC
  Filled 2018-02-23: qty 1

## 2018-02-23 MED ORDER — SODIUM CHLORIDE 0.9% FLUSH
10.0000 mL | INTRAVENOUS | Status: DC | PRN
  Administered 2018-02-23: 10 mL
  Filled 2018-02-23: qty 10

## 2018-02-23 MED ORDER — SODIUM CHLORIDE 0.9% FLUSH
10.0000 mL | Freq: Once | INTRAVENOUS | Status: AC
  Administered 2018-02-23: 10 mL
  Filled 2018-02-23: qty 10

## 2018-02-23 MED ORDER — CYANOCOBALAMIN 1000 MCG/ML IJ SOLN
1000.0000 ug | Freq: Once | INTRAMUSCULAR | Status: AC
  Administered 2018-02-23: 1000 ug via INTRAMUSCULAR

## 2018-02-23 MED ORDER — HEPARIN SOD (PORK) LOCK FLUSH 100 UNIT/ML IV SOLN
500.0000 [IU] | Freq: Once | INTRAVENOUS | Status: AC | PRN
  Administered 2018-02-23: 500 [IU]
  Filled 2018-02-23: qty 5

## 2018-02-23 NOTE — Progress Notes (Signed)
[place] [phone]   Fax:(336) [place], MD [place] [address] [place] [address]  DIAGNOSIS: Stage IV (T1b, N2, M1b) non-small cell lung cancer, adenocarcinoma diagnosed in March 2017 and presented with right lower lobe lung nodule in addition to mediastinal lymphadenopathy and metastatic bone lesions.  Molecular studies: PDL 1 TPS  <1%.   Foundation One Studies: Positive for ERBB2 [id number]. Negative for EGFR, KRAS, ALK, BRAF, MET, RET and ROS1.  PRIOR THERAPY:  1) Induction systemic chemotherapy with carboplatin for AUC of 5 and Alimta 500 MG/M2 every 3 weeks is status post 6 cycles at the [facility], last dose was given 03/05/2016 with stable disease. 2) palliative radiation to the metastatic bone disease in the lower back and pelvic area.  CURRENT THERAPY::  1)  Maintenance systemic chemotherapy with Alimta 500 MG/M2 every 3 weeks status post 33 cycles. First dose was given 03/25/2016.  2) Zometa 4 mg IV every 12 week for metastatic bone disease.  INTERVAL HIST Valerie Long 72 y.o. female returns to the clinic today for follow-up visit accompanied by her sister.  The patient is feeling fine today with no specific complaints except for arthralgia.  She is applying CBD oil with minimal improvement.  She denied having any chest pain, shortness breath, cough or hemoptysis.  She has no weight loss or night sweats.  She has no nausea, vomiting, diarrhea or constipation.  She continues to tolerate her treatment with maintenance Alimta fairly well.  She is here for evaluation before starting cycle #34.  MEDICAL HISTORY: Past Medical History:  Diagnosis Date  . Adenocarcinoma of right lung, stage 4 ([place]) 2017  . Anemia   . Arthritis   . Bone metastases ([place]) 02/24/2017  . Encounter for antineoplastic chemotherapy 03/17/2016  . Hypercholesterolemia   . Hypertension 06/18/2016  .  Osteopenia   . 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 Pneumonia   . Shortness of breath dyspnea     ALLERGIES:  has No Known Allergies.  MEDICATIONS:  Current Outpatient Medications  Medication Sig Dispense Refill  . acetaminophen (TYLENOL) 500 MG tablet Take 1,000 mg by mouth every 6 (six) hours as needed for moderate pain or headache.    . baclofen (LIORESAL) 10 MG tablet Take 1 tablet (10 mg total) by mouth 3 (three) times daily. 30 each 0  . Calcium Carb-Cholecalciferol (CALCIUM 600/VITAMIN D3 PO) Take by mouth.    . Cyanocobalamin (B-12 PO) Take 2 tablets by mouth daily.    Marland Kitchen dexamethasone (DECADRON) 4 MG tablet Take 38m by mouth twice daily the day before, of, and after chemo 40 tablet 1  . Fish Oil-Cholecalciferol (OMEGA-3 + VITAMIN D3 PO) Take 1 tablet by mouth daily.    . folic acid (FOLVITE) 1 MG tablet Take 1 tablet (1 mg total) by mouth daily. 90 tablet 1  . ketorolac (ACULAR) 0.5 % ophthalmic solution Place 1 drop into both eyes 4 (four) times daily.  0  . lidocaine-prilocaine (EMLA) cream Apply 1 application topically as needed. Apply 1-2 tsp over port site 1-2 hours prior to chemo. 30 g 0  . meloxicam (MOBIC) 15 MG tablet TAKE 1 TABLET (15 MG TOTAL) BY MOUTH DAILY AS NEEDED FOR PAIN. 30 tablet 0  . ofloxacin (OCUFLOX) 0.3 % ophthalmic solution Place 1 drop into both eyes 4 (four) times daily.  0  .  ondansetron (ZOFRAN) 8 MG tablet Take 8 mg by mouth every 8 (eight) hours as needed.    Marland Kitchen oxyCODONE-acetaminophen (PERCOCET/ROXICET) 5-325 MG tablet Take 1 tablet by mouth every 8 (eight) hours as needed for severe pain. 30 tablet 0  . prednisoLONE acetate (PRED FORTE) 1 % ophthalmic suspension Place 1 drop into both eyes 4 (four) times daily.  0  . PRESCRIPTION MEDICATION Inject 1 Dose as directed every 21 ( twenty-one) days. Chemo     No current facility-administered medications for this visit.     SURGICAL HISTORY:  Past Surgical History:  Procedure  Laterality Date  . CESAREAN SECTION     myomectomy  . COLONOSCOPY W/ POLYPECTOMY    . IR GENERIC HISTORICAL  08/17/2016   IR US GUIDE VASC ACCESS RIGHT 08/17/2016 [name], MD [facility]  . IR GENERIC HISTORICAL  08/17/2016   IR FLUORO GUIDE PORT INSERTION RIGHT 08/17/2016 [name], MD [facility]  . MEDIASTINOSCOPY N/A 09/25/2015   Procedure: MEDIASTINOSCOPY;  Surgeon: Melrose Nakayama, MD;  Location: [facility];  Service: Thoracic;  Laterality: N/A;  . VIDEO BRONCHOSCOPY Bilateral 08/19/2015   Procedure: VIDEO BRONCHOSCOPY WITH FLUORO;  Surgeon: Rigoberto Noel, MD;  Location: [facility];  Service: Cardiopulmonary;  Laterality: Bilateral;  . VIDEO BRONCHOSCOPY N/A 09/08/2015   Procedure: VIDEO BRONCHOSCOPY WITH FLUORO;  Surgeon: Rigoberto Noel, MD;  Location: [facility] [place];  Service: Thoracic;  Laterality: N/A;  . VIDEO BRONCHOSCOPY WITH ENDOBRONCHIAL ULTRASOUND Right 09/08/2015   Procedure: ATTEMPTED VIDEO BRONCHOSCOPY ENDOBRONCHIAL ULTRASOUND  ;  Surgeon: Rigoberto Noel, MD;  Location: [facility];  Service: Thoracic;  Laterality: Right;    REVIEW OF SYSTEMS:  A comprehensive review of systems was negative except for: Musculoskeletal: positive for arthralgias and back pain   PHYSICAL EXAMINATION: General appearance: alert, cooperative and no distress Head: Normocephalic, without obvious abnormality, atraumatic Neck: no adenopathy, no JVD, supple, symmetrical, trachea midline and thyroid not enlarged, symmetric, no tenderness/mass/nodules Lymph nodes: Cervical, supraclavicular, and axillary nodes normal. Resp: clear to auscultation bilaterally Back: symmetric, no curvature. ROM normal. No CVA tenderness. Cardio: regular rate and rhythm, S1, S2 normal, no murmur, click, rub or gallop GI: soft, non-tender; bowel sounds normal; no masses,  no organomegaly Extremities: extremities normal, atraumatic, no cyanosis or edema  ECOG PERFORMANCE STATUS: 1 - Symptomatic but completely  ambulatory  Blood pressure (!) 154/81, pulse 85, temperature 98 F (36.7 C), temperature source Oral, resp. rate 18, height _0  (1.753 m), weight 154 lb 12.8 oz (70.2 kg), last menstrual period 06/09/1998, SpO2 100 %.  LABORATORY DATA: Lab Results  Component Value Date   WBC 4.4 02/23/2018   HGB 10.1 (L) 02/23/2018   HCT 29.6 (L) 02/23/2018   MCV 99.3 02/23/2018   PLT 265 02/23/2018      Chemistry      Component Value Date/Time   NA 138 02/23/2018 0839   NA 137 06/09/2017 0901   K 3.9 02/23/2018 0839   K 3.9 06/09/2017 0901   CL 105 02/23/2018 0839   CO2 23 02/23/2018 0839   CO2 25 06/09/2017 0901   BUN 23 02/23/2018 0839   BUN 19.0 06/09/2017 0901   CREATININE 1.25 (H) 02/23/2018 0839   CREATININE 1.2 (H) 06/09/2017 0901   GLU 170 01/23/2016      Component Value Date/Time   CALCIUM 9.7 02/23/2018 0839   CALCIUM 9.9 06/09/2017 0901   ALKPHOS 64 02/23/2018 0839   ALKPHOS 63 06/09/2017 0901   AST 28 02/23/2018  0839   AST 35 (H) 06/09/2017 0901   ALT 12 02/23/2018 0839   ALT 23 06/09/2017 0901   BILITOT 0.5 02/23/2018 0839   BILITOT 0.65 06/09/2017 0901       RADIOGRAPHIC STUDIES: Ct Chest W Contrast  Result Date: 02/02/2018 CLINICAL DATA:  Patient with history of stage IV non-small cell lung cancer. Follow-up exam. EXAM: CT CHEST, ABDOMEN, AND PELVIS WITH CONTRAST TECHNIQUE: Multidetector CT imaging of the chest, abdomen and pelvis was performed following the standard protocol during bolus administration of intravenous contrast. CONTRAST:  137m OMNIPAQUE IOHEXOL 300 MG/ML  SOLN COMPARISON:  CT CAP 11/08/2017 FINDINGS: CT CHEST FINDINGS Cardiovascular: Right anterior chest wall Port-A-Cath is present with tip terminating in the superior vena cava. Heart is mildly enlarged. Trace fluid superior pericardial recess. Aorta and main pulmonary artery normal in caliber. Mediastinum/Nodes: No enlarged axillary, mediastinal or hilar lymphadenopathy. Normal appearance of the  esophagus. Lungs/Pleura: Central airways are patent. Grossly similar anterior right lower lobe irregular nodule measuring 1.8 x 2.4 cm (image 74; series 6), previously 2.3 x 1.7 cm. Multiple bilateral additional pulmonary nodules are grossly similar when compared to prior exam. Reference nodules as follows: Right middle lobe nodule measures 7 mm (image 70; series 6), previously 7 mm, superior segment right lower lobe nodule measures 8 mm (image 40; series 6), previously 7 mm, 9 mm right upper lobe nodule (image 37; series 6), previously measuring 9 mm, 6 mm left upper lobe nodule (image 34; series 6), previously measuring 5 mm, 9 mm left lower lobe nodule (image 57; series 6), previously measuring 9 mm, 7 mm lingular nodule (image 67; series 6), previously measuring 7 mm. No pleural effusion or pneumothorax. Musculoskeletal: Similar-appearing sclerotic lesions throughout the thoracic spine, and ribs, not significantly changed. CT ABDOMEN PELVIS FINDINGS Hepatobiliary: Liver is normal in size and contour. Stable subcentimeter low-attenuation lesion left hepatic lobe, too small to characterize (image 47; series 2). Cholelithiasis. No gallbladder wall thickening. No intrahepatic or extrahepatic biliary ductal dilatation. Pancreas: Unremarkable Spleen: Unremarkable Adrenals/Urinary Tract: Normal adrenal glands. Kidneys enhance symmetrically with contrast. Malrotated inferiorly displaced left kidney. No hydronephrosis. Urinary bladder is unremarkable. Stomach/Bowel: No abnormal bowel wall thickening or evidence for bowel obstruction. No free fluid or free intraperitoneal air. Normal appendix. Small bowel loops located in the right abdomen. Vascular/Lymphatic: Normal caliber abdominal aorta. Peripheral calcified atherosclerotic plaque. Reproductive: Uterus and adnexal structures are unremarkable. Other: None. Musculoskeletal: Re- demonstrated multiple small sclerotic lesions throughout the lumbar spine and pelvis. Not  significantly changed. IMPRESSION: 1. Similar-appearing primary right lower lobe pulmonary neoplasm. 2. Multiple bilateral pulmonary metastasis, the majority which are similar in size. A few of the lesions have grown minimally. 3. Similar-appearing sclerotic osseous metastatic disease. Electronically Signed   By: DLovey NewcomerM.D.   On: 02/02/2018 08:28   Ct Abdomen Pelvis W Contrast  Result Date: 02/02/2018 CLINICAL DATA:  Patient with history of stage IV non-small cell lung cancer. Follow-up exam. EXAM: CT CHEST, ABDOMEN, AND PELVIS WITH CONTRAST TECHNIQUE: Multidetector CT imaging of the chest, abdomen and pelvis was performed following the standard protocol during bolus administration of intravenous contrast. CONTRAST:  1027mOMNIPAQUE IOHEXOL 300 MG/ML  SOLN COMPARISON:  CT CAP 11/08/2017 FINDINGS: CT CHEST FINDINGS Cardiovascular: Right anterior chest wall Port-A-Cath is present with tip terminating in the superior vena cava. Heart is mildly enlarged. Trace fluid superior pericardial recess. Aorta and main pulmonary artery normal in caliber. Mediastinum/Nodes: No enlarged axillary, mediastinal or hilar lymphadenopathy. Normal appearance of the esophagus.  Lungs/Pleura: Central airways are patent. Grossly similar anterior right lower lobe irregular nodule measuring 1.8 x 2.4 cm (image 74; series 6), previously 2.3 x 1.7 cm. Multiple bilateral additional pulmonary nodules are grossly similar when compared to prior exam. Reference nodules as follows: Right middle lobe nodule measures 7 mm (image 70; series 6), previously 7 mm, superior segment right lower lobe nodule measures 8 mm (image 40; series 6), previously 7 mm, 9 mm right upper lobe nodule (image 37; series 6), previously measuring 9 mm, 6 mm left upper lobe nodule (image 34; series 6), previously measuring 5 mm, 9 mm left lower lobe nodule (image 57; series 6), previously measuring 9 mm, 7 mm lingular nodule (image 67; series 6), previously measuring 7  mm. No pleural effusion or pneumothorax. Musculoskeletal: Similar-appearing sclerotic lesions throughout the thoracic spine, and ribs, not significantly changed. CT ABDOMEN PELVIS FINDINGS Hepatobiliary: Liver is normal in size and contour. Stable subcentimeter low-attenuation lesion left hepatic lobe, too small to characterize (image 47; series 2). Cholelithiasis. No gallbladder wall thickening. No intrahepatic or extrahepatic biliary ductal dilatation. Pancreas: Unremarkable Spleen: Unremarkable Adrenals/Urinary Tract: Normal adrenal glands. Kidneys enhance symmetrically with contrast. Malrotated inferiorly displaced left kidney. No hydronephrosis. Urinary bladder is unremarkable. Stomach/Bowel: No abnormal bowel wall thickening or evidence for bowel obstruction. No free fluid or free intraperitoneal air. Normal appendix. Small bowel loops located in the right abdomen. Vascular/Lymphatic: Normal caliber abdominal aorta. Peripheral calcified atherosclerotic plaque. Reproductive: Uterus and adnexal structures are unremarkable. Other: None. Musculoskeletal: Re- demonstrated multiple small sclerotic lesions throughout the lumbar spine and pelvis. Not significantly changed. IMPRESSION: 1. Similar-appearing primary right lower lobe pulmonary neoplasm. 2. Multiple bilateral pulmonary metastasis, the majority which are similar in size. A few of the lesions have grown minimally. 3. Similar-appearing sclerotic osseous metastatic disease. Electronically Signed   By: Lovey Newcomer M.D.   On: 02/02/2018 08:28    ASSESSMENT AND PLAN:  This is a very pleasant 72 years old Hispanic female with metastatic non-small cell lung cancer, adenocarcinoma status post induction systemic chemotherapy with carboplatin and Alimta and she is currently on maintenance treatment with single agent Alimta status post 33 cycles. The patient continues to tolerate her treatment well with no concerning complaints. I recommended for her to proceed  with cycle #34 today as scheduled. I recommended for her to proceed with cycle #34 today as scheduled. I will see her back for follow-up visit in 3 weeks for evaluation before the next cycle of her treatment. For hypertension she will continue with her current blood pressure medications and she was advised to monitor it closely. The patient was advised to call immediately if she has any concerning symptoms in the interval. The patient voices understanding of current disease status and treatment options and is in agreement with the current care plan. All questions were answered. The patient knows to call the clinic with any problems, questions or concerns. We can certainly see the patient much sooner if necessary.  Disclaimer: This note was dictated with voice recognition software. Similar sounding words can inadvertently be transcribed and may not be corrected upon review.

## 2018-02-23 NOTE — Progress Notes (Signed)
Oncology Nurse Navigator Documentation  Oncology Nurse Navigator Flowsheets 02/23/2018  Navigator Location [facility]  Navigator Encounter Type Clinic/MDC/I spoke with patient and her family today at the cancer center.  Patient did complain of muscle pain and is using CBD oil.  Dr. Julien Nordmann spoke to her about that.  There no barriers identified at this time.   Patient Visit Type MedOnc  Treatment Phase Treatment  Barriers/Navigation Needs No barriers at this time  Acuity Level 1  Time Spent with Patient 15

## 2018-02-23 NOTE — Telephone Encounter (Signed)
3 cycles already scheduled per 9/19 los.  No additional appts added.

## 2018-02-23 NOTE — Patient Instructions (Signed)
[facility] Discharge Instructions for Patients Receiving Chemotherapy  Today you received the following chemotherapy agents Alimta   To help prevent nausea and vomiting after your treatment, we encourage you to take your nausea medication as directed.    If you develop nausea and vomiting that is not controlled by your nausea medication, call the clinic.   BELOW ARE SYMPTOMS THAT SHOULD BE REPORTED IMMEDIATELY:  *FEVER GREATER THAN 100.5 F  *CHILLS WITH OR WITHOUT FEVER  NAUSEA AND VOMITING THAT IS NOT CONTROLLED WITH YOUR NAUSEA MEDICATION  *UNUSUAL SHORTNESS OF BREATH  *UNUSUAL BRUISING OR BLEEDING  TENDERNESS IN MOUTH AND THROAT WITH OR WITHOUT PRESENCE OF ULCERS  *URINARY PROBLEMS  *BOWEL PROBLEMS  UNUSUAL RASH Items with * indicate a potential emergency and should be followed up as soon as possible.  Feel free to call the clinic should you have any questions or concerns. The clinic phone number is [phone].  Please show the [facility] at check-in to the Emergency Department and triage nurse.

## 2018-03-01 ENCOUNTER — Other Ambulatory Visit: Payer: Self-pay | Admitting: Internal Medicine

## 2018-03-01 DIAGNOSIS — C7951 Secondary malignant neoplasm of bone: Secondary | ICD-10-CM

## 2018-03-01 DIAGNOSIS — C3491 Malignant neoplasm of unspecified part of right bronchus or lung: Secondary | ICD-10-CM

## 2018-03-16 ENCOUNTER — Encounter: Payer: Self-pay | Admitting: Internal Medicine

## 2018-03-16 ENCOUNTER — Inpatient Hospital Stay: Payer: Medicare Other

## 2018-03-16 ENCOUNTER — Inpatient Hospital Stay: Payer: Medicare Other | Attending: Internal Medicine

## 2018-03-16 ENCOUNTER — Inpatient Hospital Stay (HOSPITAL_BASED_OUTPATIENT_CLINIC_OR_DEPARTMENT_OTHER): Payer: Medicare Other | Admitting: Internal Medicine

## 2018-03-16 ENCOUNTER — Telehealth: Payer: Self-pay | Admitting: Internal Medicine

## 2018-03-16 VITALS — BP 146/83 | HR 80 | Temp 98.0°F | Resp 18 | Ht 69.0 in | Wt 154.0 lb

## 2018-03-16 DIAGNOSIS — C3490 Malignant neoplasm of unspecified part of unspecified bronchus or lung: Secondary | ICD-10-CM

## 2018-03-16 DIAGNOSIS — C3491 Malignant neoplasm of unspecified part of right bronchus or lung: Secondary | ICD-10-CM

## 2018-03-16 DIAGNOSIS — Z5111 Encounter for antineoplastic chemotherapy: Secondary | ICD-10-CM | POA: Diagnosis not present

## 2018-03-16 DIAGNOSIS — M545 Low back pain: Secondary | ICD-10-CM | POA: Diagnosis not present

## 2018-03-16 DIAGNOSIS — Z79899 Other long term (current) drug therapy: Secondary | ICD-10-CM | POA: Insufficient documentation

## 2018-03-16 DIAGNOSIS — C7951 Secondary malignant neoplasm of bone: Secondary | ICD-10-CM

## 2018-03-16 DIAGNOSIS — Z95828 Presence of other vascular implants and grafts: Secondary | ICD-10-CM

## 2018-03-16 DIAGNOSIS — I10 Essential (primary) hypertension: Secondary | ICD-10-CM

## 2018-03-16 DIAGNOSIS — C3431 Malignant neoplasm of lower lobe, right bronchus or lung: Secondary | ICD-10-CM | POA: Diagnosis not present

## 2018-03-16 LAB — CBC WITH DIFFERENTIAL/PLATELET
Abs Immature Granulocytes: 0.02 10*3/uL (ref 0.00–0.07)
Basophils Absolute: 0 10*3/uL (ref 0.0–0.1)
Basophils Relative: 0 %
Eosinophils Absolute: 0 10*3/uL (ref 0.0–0.5)
Eosinophils Relative: 0 %
HCT: 29.6 % — ABNORMAL LOW (ref 36.0–46.0)
Hemoglobin: 10 g/dL — ABNORMAL LOW (ref 12.0–15.0)
Immature Granulocytes: 0 %
Lymphocytes Relative: 12 %
Lymphs Abs: 0.6 10*3/uL — ABNORMAL LOW (ref 0.7–4.0)
MCH: 33.7 pg (ref 26.0–34.0)
MCHC: 33.8 g/dL (ref 30.0–36.0)
MCV: 99.7 fL (ref 80.0–100.0)
Monocytes Absolute: 0.4 10*3/uL (ref 0.1–1.0)
Monocytes Relative: 7 %
Neutro Abs: 4 10*3/uL (ref 1.7–7.7)
Neutrophils Relative %: 81 %
Platelets: 280 10*3/uL (ref 150–400)
RBC: 2.97 MIL/uL — ABNORMAL LOW (ref 3.87–5.11)
RDW: 14.3 % (ref 11.5–15.5)
WBC: 5 10*3/uL (ref 4.0–10.5)
nRBC: 0 % (ref 0.0–0.2)

## 2018-03-16 LAB — COMPREHENSIVE METABOLIC PANEL
ALT: 12 U/L (ref 0–44)
AST: 26 U/L (ref 15–41)
Albumin: 3.4 g/dL — ABNORMAL LOW (ref 3.5–5.0)
Alkaline Phosphatase: 66 U/L (ref 38–126)
Anion gap: 10 (ref 5–15)
BUN: 19 mg/dL (ref 8–23)
CO2: 24 mmol/L (ref 22–32)
Calcium: 9.5 mg/dL (ref 8.9–10.3)
Chloride: 104 mmol/L (ref 98–111)
Creatinine, Ser: 1.2 mg/dL — ABNORMAL HIGH (ref 0.44–1.00)
GFR calc Af Amer: 52 mL/min — ABNORMAL LOW (ref >60)
GFR calc non Af Amer: 45 mL/min — ABNORMAL LOW (ref >60)
Glucose, Bld: 140 mg/dL — ABNORMAL HIGH (ref 70–99)
Potassium: 3.9 mmol/L (ref 3.5–5.1)
Sodium: 138 mmol/L (ref 135–145)
Total Bilirubin: 0.6 mg/dL (ref 0.3–1.2)
Total Protein: 6.9 g/dL (ref 6.5–8.1)

## 2018-03-16 MED ORDER — PROCHLORPERAZINE MALEATE 10 MG PO TABS
ORAL_TABLET | ORAL | Status: AC
  Filled 2018-03-16: qty 1

## 2018-03-16 MED ORDER — PROCHLORPERAZINE MALEATE 10 MG PO TABS: 10.0000 mg | ORAL_TABLET | Freq: Four times a day (QID) | ORAL | 0 refills | Status: DC | PRN

## 2018-03-16 MED ORDER — SODIUM CHLORIDE 0.9% FLUSH
10.0000 mL | INTRAVENOUS | Status: DC | PRN
  Administered 2018-03-16: 10 mL
  Filled 2018-03-16: qty 10

## 2018-03-16 MED ORDER — ZOLEDRONIC ACID 4 MG/100ML IV SOLN
4.0000 mg | Freq: Once | INTRAVENOUS | Status: AC
  Administered 2018-03-16: 4 mg via INTRAVENOUS
  Filled 2018-03-16: qty 100

## 2018-03-16 MED ORDER — SODIUM CHLORIDE 0.9 % IV SOLN
Freq: Once | INTRAVENOUS | Status: AC
  Administered 2018-03-16: 10:00:00 via INTRAVENOUS
  Filled 2018-03-16: qty 250

## 2018-03-16 MED ORDER — HEPARIN SOD (PORK) LOCK FLUSH 100 UNIT/ML IV SOLN
500.0000 [IU] | Freq: Once | INTRAVENOUS | Status: AC | PRN
  Administered 2018-03-16: 500 [IU]
  Filled 2018-03-16: qty 5

## 2018-03-16 MED ORDER — SODIUM CHLORIDE 0.9% FLUSH
10.0000 mL | Freq: Once | INTRAVENOUS | Status: AC
  Administered 2018-03-16: 10 mL
  Filled 2018-03-16: qty 10

## 2018-03-16 MED ORDER — SODIUM CHLORIDE 0.9 % IV SOLN
530.0000 mg/m2 | Freq: Once | INTRAVENOUS | Status: AC
  Administered 2018-03-16: 1000 mg via INTRAVENOUS
  Filled 2018-03-16: qty 40

## 2018-03-16 MED ORDER — PROCHLORPERAZINE MALEATE 10 MG PO TABS
10.0000 mg | ORAL_TABLET | Freq: Once | ORAL | Status: AC
  Administered 2018-03-16: 10 mg via ORAL

## 2018-03-16 NOTE — Progress Notes (Signed)
[place] [phone]   Fax:(336) [place], MD [place] [address] [place] [address]  DIAGNOSIS: Stage IV (T1b, N2, M1b) non-small cell lung cancer, adenocarcinoma diagnosed in March 2017 and presented with right lower lobe lung nodule in addition to mediastinal lymphadenopathy and metastatic bone lesions.  Molecular studies: PDL 1 TPS  <1%.   Foundation One Studies: Positive for ERBB2 [id number]. Negative for EGFR, KRAS, ALK, BRAF, MET, RET and ROS1.  PRIOR THERAPY:  1) Induction systemic chemotherapy with carboplatin for AUC of 5 and Alimta 500 MG/M2 every 3 weeks is status post 6 cycles at the [facility], last dose was given 03/05/2016 with stable disease. 2) palliative radiation to the metastatic bone disease in the lower back and pelvic area.  CURRENT THERAPY::  1)  Maintenance systemic chemotherapy with Alimta 500 MG/M2 every 3 weeks status post 34 cycles. First dose was given 03/25/2016.  2) Zometa 4 mg IV every 12 week for metastatic bone disease.  INTERVAL HIST [place] 72 y.o. female returns to the clinic today for follow-up visit accompanied by her sister.  The patient is feeling fine today with no concerning complaints except for few episodes of nausea 2 days after her treatment.  She was Compazine with improvement of her symptoms.  She denied having any current chest pain, shortness breath, cough or hemoptysis.  She has no nausea, vomiting, diarrhea or constipation.  She continues to have intermittent low back pain.  She has no weight loss or night sweats.  The patient is here today for evaluation before starting cycle #35 of her treatment.   MEDICAL HISTORY: Past Medical History:  Diagnosis Date  . Adenocarcinoma of right lung, stage 4 ([place]) 2017  . Anemia   . Arthritis   . Bone metastases ([place]) 02/24/2017  . Encounter for antineoplastic chemotherapy 03/17/2016  .  Hypercholesterolemia   . Hypertension 06/18/2016  . Osteopenia   . 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 Pneumonia   . Shortness of breath dyspnea     ALLERGIES:  has No Known Allergies.  MEDICATIONS:  Current Outpatient Medications  Medication Sig Dispense Refill  . acetaminophen (TYLENOL) 500 MG tablet Take 1,000 mg by mouth every 6 (six) hours as needed for moderate pain or headache.    . baclofen (LIORESAL) 10 MG tablet Take 1 tablet (10 mg total) by mouth 3 (three) times daily. 30 each 0  . Calcium Carb-Cholecalciferol (CALCIUM 600/VITAMIN D3 PO) Take by mouth.    . Cyanocobalamin (B-12 PO) Take 2 tablets by mouth daily.    Marland Kitchen dexamethasone (DECADRON) 4 MG tablet Take 64m by mouth twice daily the day before, of, and after chemo 40 tablet 1  . Fish Oil-Cholecalciferol (OMEGA-3 + VITAMIN D3 PO) Take 1 tablet by mouth daily.    . folic acid (FOLVITE) 1 MG tablet Take 1 tablet (1 mg total) by mouth daily. 90 tablet 1  . ketorolac (ACULAR) 0.5 % ophthalmic solution Place 1 drop into both eyes 4 (four) times daily.  0  . lidocaine-prilocaine (EMLA) cream Apply 1 application topically as needed. Apply 1-2 tsp over port site 1-2 hours prior to chemo. 30 g 0  . meloxicam (MOBIC) 15 MG tablet TAKE 1 TABLET BY MOUTH EVERY DAY AS NEEDED FOR PAIN 30 tablet 0  . ofloxacin (OCUFLOX) 0.3 % ophthalmic solution Place 1 drop into  both eyes 4 (four) times daily.  0  . ondansetron (ZOFRAN) 8 MG tablet Take 8 mg by mouth every 8 (eight) hours as needed.    Marland Kitchen OVER THE COUNTER MEDICATION Apply 1 drop topically daily as needed. 1 drop CBD oil in vicks vapor rub . Applied to right thigh for pain relief.    Marland Kitchen oxyCODONE-acetaminophen (PERCOCET/ROXICET) 5-325 MG tablet Take 1 tablet by mouth every 8 (eight) hours as needed for severe pain. 30 tablet 0  . prednisoLONE acetate (PRED FORTE) 1 % ophthalmic suspension Place 1 drop into both eyes 4 (four) times daily.  0  . PRESCRIPTION MEDICATION  Inject 1 Dose as directed every 21 ( twenty-one) days. Chemo     No current facility-administered medications for this visit.     SURGICAL HISTORY:  Past Surgical History:  Procedure Laterality Date  . CESAREAN SECTION     myomectomy  . COLONOSCOPY W/ POLYPECTOMY    . IR GENERIC HISTORICAL  08/17/2016   IR US GUIDE VASC ACCESS RIGHT 08/17/2016 [name], MD [facility]  . IR GENERIC HISTORICAL  08/17/2016   IR FLUORO GUIDE PORT INSERTION RIGHT 08/17/2016 [name], MD [facility]  . MEDIASTINOSCOPY N/A 09/25/2015   Procedure: MEDIASTINOSCOPY;  Surgeon: Melrose Nakayama, MD;  Location: [facility];  Service: Thoracic;  Laterality: N/A;  . VIDEO BRONCHOSCOPY Bilateral 08/19/2015   Procedure: VIDEO BRONCHOSCOPY WITH FLUORO;  Surgeon: Rigoberto Noel, MD;  Location: [facility];  Service: Cardiopulmonary;  Laterality: Bilateral;  . VIDEO BRONCHOSCOPY N/A 09/08/2015   Procedure: VIDEO BRONCHOSCOPY WITH FLUORO;  Surgeon: Rigoberto Noel, MD;  Location: [facility];  Service: Thoracic;  Laterality: N/A;  . VIDEO BRONCHOSCOPY WITH ENDOBRONCHIAL ULTRASOUND Right 09/08/2015   Procedure: ATTEMPTED VIDEO BRONCHOSCOPY ENDOBRONCHIAL ULTRASOUND  ;  Surgeon: Rigoberto Noel, MD;  Location: [facility];  Service: Thoracic;  Laterality: Right;    REVIEW OF SYSTEMS:  Constitutional: positive for fatigue Gastrointestinal: positive for nausea   PHYSICAL EXAMINATION: General appearance: alert, cooperative and no distress Head: Normocephalic, without obvious abnormality, atraumatic Neck: no adenopathy, no JVD, supple, symmetrical, trachea midline and thyroid not enlarged, symmetric, no tenderness/mass/nodules Lymph nodes: Cervical, supraclavicular, and axillary nodes normal. Resp: clear to auscultation bilaterally Back: symmetric, no curvature. ROM normal. No CVA tenderness. Cardio: regular rate and rhythm, S1, S2 normal, no murmur, click, rub or gallop GI: soft, non-tender; bowel sounds normal; no masses,  no  organomegaly Extremities: extremities normal, atraumatic, no cyanosis or edema  ECOG PERFORMANCE STATUS: 1 - Symptomatic but completely ambulatory  Blood pressure (!) 146/83, pulse 80, temperature 98 F (36.7 C), temperature source Oral, resp. rate 18, height '5\' 9"'  (1.753 m), weight 154 lb (69.9 kg), last menstrual period 06/09/1998, SpO2 100 %.  LABORATORY DATA: Lab Results  Component Value Date   WBC 5.0 03/16/2018   HGB 10.0 (L) 03/16/2018   HCT 29.6 (L) 03/16/2018   MCV 99.7 03/16/2018   PLT 280 03/16/2018      Chemistry      Component Value Date/Time   NA 138 02/23/2018 0839   NA 137 06/09/2017 0901   K 3.9 02/23/2018 0839   K 3.9 06/09/2017 0901   CL 105 02/23/2018 0839   CO2 23 02/23/2018 0839   CO2 25 06/09/2017 0901   BUN 23 02/23/2018 0839   BUN 19.0 06/09/2017 0901   CREATININE 1.25 (H) 02/23/2018 0839   CREATININE 1.2 (H) 06/09/2017 0901   GLU 170 01/23/2016      Component  Value Date/Time   CALCIUM 9.7 02/23/2018 0839   CALCIUM 9.9 06/09/2017 0901   ALKPHOS 64 02/23/2018 0839   ALKPHOS 63 06/09/2017 0901   AST 28 02/23/2018 0839   AST 35 (H) 06/09/2017 0901   ALT 12 02/23/2018 0839   ALT 23 06/09/2017 0901   BILITOT 0.5 02/23/2018 0839   BILITOT 0.65 06/09/2017 0901       RADIOGRAPHIC STUDIES: No results found.  ASSESSMENT AND PLAN:  This is a very pleasant 72 years old Hispanic female with metastatic non-small cell lung cancer, adenocarcinoma status post induction systemic chemotherapy with carboplatin and Alimta and she is currently on maintenance treatment with single agent Alimta status post 34 cycles. The patient continues to tolerate her treatment well with no concerning adverse effects. I recommended for her to proceed with cycle #35 today as scheduled. I will see her back for follow-up visit in 3 weeks for evaluation before the next cycle of her treatment after repeating CT scan of the chest, abdomen and pelvis for restaging of her  disease. She was advised to call immediately if she has any concerning symptoms in the interval. The patient voices understanding of current disease status and treatment options and is in agreement with the current care plan. All questions were answered. The patient knows to call the clinic with any problems, questions or concerns. We can certainly see the patient much sooner if necessary.  Disclaimer: This note was dictated with voice recognition software. Similar sounding words can inadvertently be transcribed and may not be corrected upon review.

## 2018-03-16 NOTE — Patient Instructions (Addendum)
[facility] Discharge Instructions for Patients Receiving Chemotherapy  Today you received the following chemotherapy agents:  Alimta  To help prevent nausea and vomiting after your treatment, we encourage you to take your nausea medication as prescribed.   If you develop nausea and vomiting that is not controlled by your nausea medication, call the clinic.   BELOW ARE SYMPTOMS THAT SHOULD BE REPORTED IMMEDIATELY:  *FEVER GREATER THAN 100.5 F  *CHILLS WITH OR WITHOUT FEVER  NAUSEA AND VOMITING THAT IS NOT CONTROLLED WITH YOUR NAUSEA MEDICATION  *UNUSUAL SHORTNESS OF BREATH  *UNUSUAL BRUISING OR BLEEDING  TENDERNESS IN MOUTH AND THROAT WITH OR WITHOUT PRESENCE OF ULCERS  *URINARY PROBLEMS  *BOWEL PROBLEMS  UNUSUAL RASH Items with * indicate a potential emergency and should be followed up as soon as possible.  Feel free to call the clinic should you have any questions or concerns. The clinic phone number is [phone].  Please show the [place] at check-in to the Emergency Department and triage nurse.    Zoledronic Acid injection (Hypercalcemia, Oncology) What is this medicine? ZOLEDRONIC ACID (ZOE le dron ik AS id) lowers the amount of calcium loss from bone. It is used to treat too much calcium in your blood from cancer. It is also used to prevent complications of cancer that has spread to the bone. This medicine may be used for other purposes; ask your health care provider or pharmacist if you have questions. COMMON BRAND NAME(S): Zometa What should I tell my health care provider before I take this medicine? They need to know if you have any of these conditions: -aspirin-sensitive asthma -cancer, especially if you are receiving medicines used to treat cancer -dental disease or wear dentures -infection -kidney disease -receiving corticosteroids like dexamethasone or prednisone -an unusual or allergic reaction to zoledronic acid, other  medicines, foods, dyes, or preservatives -pregnant or trying to get pregnant -breast-feeding How should I use this medicine? This medicine is for infusion into a vein. It is given by a health care professional in a hospital or clinic setting. Talk to your pediatrician regarding the use of this medicine in children. Special care may be needed. Overdosage: If you think you have taken too much of this medicine contact a poison control center or emergency room at once. NOTE: This medicine is only for you. Do not share this medicine with others. What if I miss a dose? It is important not to miss your dose. Call your doctor or health care professional if you are unable to keep an appointment. What may interact with this medicine? -certain antibiotics given by injection -NSAIDs, medicines for pain and inflammation, like ibuprofen or naproxen -some diuretics like bumetanide, furosemide -teriparatide -thalidomide This list may not describe all possible interactions. Give your health care provider a list of all the medicines, herbs, non-prescription drugs, or dietary supplements you use. Also tell them if you smoke, drink alcohol, or use illegal drugs. Some items may interact with your medicine. What should I watch for while using this medicine? Visit your doctor or health care professional for regular checkups. It may be some time before you see the benefit from this medicine. Do not stop taking your medicine unless your doctor tells you to. Your doctor may order blood tests or other tests to see how you are doing. Women should inform their doctor if they wish to become pregnant or think they might be pregnant. There is a potential for serious side effects to an unborn  child. Talk to your health care professional or pharmacist for more information. You should make sure that you get enough calcium and vitamin D while you are taking this medicine. Discuss the foods you eat and the vitamins you take with  your health care professional. Some people who take this medicine have severe bone, joint, and/or muscle pain. This medicine may also increase your risk for jaw problems or a broken thigh bone. Tell your doctor right away if you have severe pain in your jaw, bones, joints, or muscles. Tell your doctor if you have any pain that does not go away or that gets worse. Tell your dentist and dental surgeon that you are taking this medicine. You should not have major dental surgery while on this medicine. See your dentist to have a dental exam and fix any dental problems before starting this medicine. Take good care of your teeth while on this medicine. Make sure you see your dentist for regular follow-up appointments. What side effects may I notice from receiving this medicine? Side effects that you should report to your doctor or health care professional as soon as possible: -allergic reactions like skin rash, itching or hives, swelling of the face, lips, or tongue -anxiety, confusion, or depression -breathing problems -changes in vision -eye pain -feeling faint or lightheaded, falls -jaw pain, especially after dental work -mouth sores -muscle cramps, stiffness, or weakness -redness, blistering, peeling or loosening of the skin, including inside the mouth -trouble passing urine or change in the amount of urine Side effects that usually do not require medical attention (report to your doctor or health care professional if they continue or are bothersome): -bone, joint, or muscle pain -constipation -diarrhea -fever -hair loss -irritation at site where injected -loss of appetite -nausea, vomiting -stomach upset -trouble sleeping -trouble swallowing -weak or tired This list may not describe all possible side effects. Call your doctor for medical advice about side effects. You may report side effects to FDA at 1-800-FDA-1088. Where should I keep my medicine? This drug is given in a hospital or  clinic and will not be stored at home. NOTE: This sheet is a summary. It may not cover all possible information. If you have questions about this medicine, talk to your doctor, pharmacist, or health care provider.  2018 [employer] Standard (2013-10-20 14:19:39)

## 2018-03-16 NOTE — Telephone Encounter (Signed)
Scheduled apptpe r 10/10 los - PT  to get an updated schedule next visit - [facility] radi[facility] to contact patient with ct scan .

## 2018-03-22 DIAGNOSIS — D23111 Other benign neoplasm of skin of right upper eyelid, including canthus: Secondary | ICD-10-CM | POA: Diagnosis not present

## 2018-03-22 DIAGNOSIS — H04123 Dry eye syndrome of bilateral lacrimal glands: Secondary | ICD-10-CM | POA: Diagnosis not present

## 2018-04-05 ENCOUNTER — Ambulatory Visit (HOSPITAL_COMMUNITY)
Admission: RE | Admit: 2018-04-05 | Discharge: 2018-04-05 | Disposition: A | Discharge status: Home / Self Care | Payer: Medicare Other | Source: Ambulatory Visit | Attending: Internal Medicine | Admitting: Internal Medicine

## 2018-04-05 DIAGNOSIS — Z5111 Encounter for antineoplastic chemotherapy: Secondary | ICD-10-CM | POA: Diagnosis not present

## 2018-04-05 DIAGNOSIS — C3490 Malignant neoplasm of unspecified part of unspecified bronchus or lung: Secondary | ICD-10-CM | POA: Insufficient documentation

## 2018-04-05 DIAGNOSIS — C7951 Secondary malignant neoplasm of bone: Secondary | ICD-10-CM | POA: Diagnosis not present

## 2018-04-05 MED ORDER — IOHEXOL 300 MG/ML  SOLN
100.0000 mL | Freq: Once | INTRAMUSCULAR | Status: AC | PRN
  Administered 2018-04-05: 100 mL via INTRAVENOUS

## 2018-04-05 MED ORDER — SODIUM CHLORIDE 0.9 % IJ SOLN
INTRAMUSCULAR | Status: AC
  Filled 2018-04-05: qty 50

## 2018-04-06 ENCOUNTER — Inpatient Hospital Stay: Payer: Medicare Other

## 2018-04-06 ENCOUNTER — Encounter: Payer: Self-pay | Admitting: Internal Medicine

## 2018-04-06 ENCOUNTER — Other Ambulatory Visit: Payer: Self-pay | Admitting: Internal Medicine

## 2018-04-06 ENCOUNTER — Inpatient Hospital Stay (HOSPITAL_BASED_OUTPATIENT_CLINIC_OR_DEPARTMENT_OTHER): Payer: Medicare Other | Admitting: Internal Medicine

## 2018-04-06 VITALS — BP 136/66 | HR 77 | Temp 97.7°F | Resp 18 | Ht 69.0 in | Wt 150.7 lb

## 2018-04-06 DIAGNOSIS — C7951 Secondary malignant neoplasm of bone: Secondary | ICD-10-CM

## 2018-04-06 DIAGNOSIS — C3491 Malignant neoplasm of unspecified part of right bronchus or lung: Secondary | ICD-10-CM

## 2018-04-06 DIAGNOSIS — M545 Low back pain: Secondary | ICD-10-CM

## 2018-04-06 DIAGNOSIS — Z95828 Presence of other vascular implants and grafts: Secondary | ICD-10-CM

## 2018-04-06 DIAGNOSIS — C3431 Malignant neoplasm of lower lobe, right bronchus or lung: Secondary | ICD-10-CM

## 2018-04-06 DIAGNOSIS — D6481 Anemia due to antineoplastic chemotherapy: Secondary | ICD-10-CM

## 2018-04-06 DIAGNOSIS — Z5111 Encounter for antineoplastic chemotherapy: Secondary | ICD-10-CM | POA: Diagnosis not present

## 2018-04-06 DIAGNOSIS — T451X5A Adverse effect of antineoplastic and immunosuppressive drugs, initial encounter: Secondary | ICD-10-CM

## 2018-04-06 DIAGNOSIS — Z79899 Other long term (current) drug therapy: Secondary | ICD-10-CM | POA: Diagnosis not present

## 2018-04-06 LAB — COMPREHENSIVE METABOLIC PANEL
ALT: 12 U/L (ref 0–44)
AST: 29 U/L (ref 15–41)
Albumin: 3.2 g/dL — ABNORMAL LOW (ref 3.5–5.0)
Alkaline Phosphatase: 63 U/L (ref 38–126)
Anion gap: 10 (ref 5–15)
BUN: 18 mg/dL (ref 8–23)
CO2: 26 mmol/L (ref 22–32)
Calcium: 9.7 mg/dL (ref 8.9–10.3)
Chloride: 104 mmol/L (ref 98–111)
Creatinine, Ser: 1.16 mg/dL — ABNORMAL HIGH (ref 0.44–1.00)
GFR calc Af Amer: 54 mL/min — ABNORMAL LOW (ref >60)
GFR calc non Af Amer: 47 mL/min — ABNORMAL LOW (ref >60)
Glucose, Bld: 105 mg/dL — ABNORMAL HIGH (ref 70–99)
Potassium: 3.6 mmol/L (ref 3.5–5.1)
Sodium: 140 mmol/L (ref 135–145)
Total Bilirubin: 0.6 mg/dL (ref 0.3–1.2)
Total Protein: 6.5 g/dL (ref 6.5–8.1)

## 2018-04-06 LAB — CBC WITH DIFFERENTIAL/PLATELET
Abs Immature Granulocytes: 0.02 10*3/uL (ref 0.00–0.07)
Basophils Absolute: 0 10*3/uL (ref 0.0–0.1)
Basophils Relative: 0 %
Eosinophils Absolute: 0 10*3/uL (ref 0.0–0.5)
Eosinophils Relative: 0 %
HCT: 29.3 % — ABNORMAL LOW (ref 36.0–46.0)
Hemoglobin: 9.5 g/dL — ABNORMAL LOW (ref 12.0–15.0)
Immature Granulocytes: 1 %
Lymphocytes Relative: 19 %
Lymphs Abs: 0.8 10*3/uL (ref 0.7–4.0)
MCH: 33.2 pg (ref 26.0–34.0)
MCHC: 32.4 g/dL (ref 30.0–36.0)
MCV: 102.4 fL — ABNORMAL HIGH (ref 80.0–100.0)
Monocytes Absolute: 0.5 10*3/uL (ref 0.1–1.0)
Monocytes Relative: 12 %
Neutro Abs: 2.9 10*3/uL (ref 1.7–7.7)
Neutrophils Relative %: 68 %
Platelets: 240 10*3/uL (ref 150–400)
RBC: 2.86 MIL/uL — ABNORMAL LOW (ref 3.87–5.11)
RDW: 14.5 % (ref 11.5–15.5)
WBC: 4.2 10*3/uL (ref 4.0–10.5)
nRBC: 0 % (ref 0.0–0.2)

## 2018-04-06 MED ORDER — SODIUM CHLORIDE 0.9% FLUSH
10.0000 mL | INTRAVENOUS | Status: DC | PRN
  Administered 2018-04-06: 10 mL
  Filled 2018-04-06: qty 10

## 2018-04-06 MED ORDER — PROCHLORPERAZINE MALEATE 10 MG PO TABS
10.0000 mg | ORAL_TABLET | Freq: Once | ORAL | Status: AC
  Administered 2018-04-06: 10 mg via ORAL

## 2018-04-06 MED ORDER — SODIUM CHLORIDE 0.9 % IV SOLN
530.0000 mg/m2 | Freq: Once | INTRAVENOUS | Status: AC
  Administered 2018-04-06: 1000 mg via INTRAVENOUS
  Filled 2018-04-06: qty 40

## 2018-04-06 MED ORDER — SODIUM CHLORIDE 0.9% FLUSH
10.0000 mL | Freq: Once | INTRAVENOUS | Status: AC
  Administered 2018-04-06: 10 mL
  Filled 2018-04-06: qty 10

## 2018-04-06 MED ORDER — HEPARIN SOD (PORK) LOCK FLUSH 100 UNIT/ML IV SOLN
500.0000 [IU] | Freq: Once | INTRAVENOUS | Status: DC
  Filled 2018-04-06: qty 5

## 2018-04-06 MED ORDER — PROCHLORPERAZINE MALEATE 10 MG PO TABS
ORAL_TABLET | ORAL | Status: AC
  Filled 2018-04-06: qty 1

## 2018-04-06 MED ORDER — HEPARIN SOD (PORK) LOCK FLUSH 100 UNIT/ML IV SOLN
500.0000 [IU] | Freq: Once | INTRAVENOUS | Status: AC | PRN
  Administered 2018-04-06: 500 [IU]
  Filled 2018-04-06: qty 5

## 2018-04-06 MED ORDER — SODIUM CHLORIDE 0.9 % IV SOLN
Freq: Once | INTRAVENOUS | Status: AC
  Administered 2018-04-06: 10:00:00 via INTRAVENOUS
  Filled 2018-04-06: qty 250

## 2018-04-06 NOTE — Patient Instructions (Signed)
[facility] Discharge Instructions for Patients Receiving Chemotherapy  Today you received the following chemotherapy agents:  Alimta  To help prevent nausea and vomiting after your treatment, we encourage you to take your nausea medication as prescribed.   If you develop nausea and vomiting that is not controlled by your nausea medication, call the clinic.   BELOW ARE SYMPTOMS THAT SHOULD BE REPORTED IMMEDIATELY:  *FEVER GREATER THAN 100.5 F  *CHILLS WITH OR WITHOUT FEVER  NAUSEA AND VOMITING THAT IS NOT CONTROLLED WITH YOUR NAUSEA MEDICATION  *UNUSUAL SHORTNESS OF BREATH  *UNUSUAL BRUISING OR BLEEDING  TENDERNESS IN MOUTH AND THROAT WITH OR WITHOUT PRESENCE OF ULCERS  *URINARY PROBLEMS  *BOWEL PROBLEMS  UNUSUAL RASH Items with * indicate a potential emergency and should be followed up as soon as possible.  Feel free to call the clinic should you have any questions or concerns. The clinic phone number is [phone].  Please show the [place] at check-in to the Emergency Department and triage nurse.    Zoledronic Acid injection (Hypercalcemia, Oncology) What is this medicine? ZOLEDRONIC ACID (ZOE le dron ik AS id) lowers the amount of calcium loss from bone. It is used to treat too much calcium in your blood from cancer. It is also used to prevent complications of cancer that has spread to the bone. This medicine may be used for other purposes; ask your health care provider or pharmacist if you have questions. COMMON BRAND NAME(S): Zometa What should I tell my health care provider before I take this medicine? They need to know if you have any of these conditions: -aspirin-sensitive asthma -cancer, especially if you are receiving medicines used to treat cancer -dental disease or wear dentures -infection -kidney disease -receiving corticosteroids like dexamethasone or prednisone -an unusual or allergic reaction to zoledronic acid, other  medicines, foods, dyes, or preservatives -pregnant or trying to get pregnant -breast-feeding How should I use this medicine? This medicine is for infusion into a vein. It is given by a health care professional in a hospital or clinic setting. Talk to your pediatrician regarding the use of this medicine in children. Special care may be needed. Overdosage: If you think you have taken too much of this medicine contact a poison control center or emergency room at once. NOTE: This medicine is only for you. Do not share this medicine with others. What if I miss a dose? It is important not to miss your dose. Call your doctor or health care professional if you are unable to keep an appointment. What may interact with this medicine? -certain antibiotics given by injection -NSAIDs, medicines for pain and inflammation, like ibuprofen or naproxen -some diuretics like bumetanide, furosemide -teriparatide -thalidomide This list may not describe all possible interactions. Give your health care provider a list of all the medicines, herbs, non-prescription drugs, or dietary supplements you use. Also tell them if you smoke, drink alcohol, or use illegal drugs. Some items may interact with your medicine. What should I watch for while using this medicine? Visit your doctor or health care professional for regular checkups. It may be some time before you see the benefit from this medicine. Do not stop taking your medicine unless your doctor tells you to. Your doctor may order blood tests or other tests to see how you are doing. Women should inform their doctor if they wish to become pregnant or think they might be pregnant. There is a potential for serious side effects to an unborn  child. Talk to your health care professional or pharmacist for more information. You should make sure that you get enough calcium and vitamin D while you are taking this medicine. Discuss the foods you eat and the vitamins you take with  your health care professional. Some people who take this medicine have severe bone, joint, and/or muscle pain. This medicine may also increase your risk for jaw problems or a broken thigh bone. Tell your doctor right away if you have severe pain in your jaw, bones, joints, or muscles. Tell your doctor if you have any pain that does not go away or that gets worse. Tell your dentist and dental surgeon that you are taking this medicine. You should not have major dental surgery while on this medicine. See your dentist to have a dental exam and fix any dental problems before starting this medicine. Take good care of your teeth while on this medicine. Make sure you see your dentist for regular follow-up appointments. What side effects may I notice from receiving this medicine? Side effects that you should report to your doctor or health care professional as soon as possible: -allergic reactions like skin rash, itching or hives, swelling of the face, lips, or tongue -anxiety, confusion, or depression -breathing problems -changes in vision -eye pain -feeling faint or lightheaded, falls -jaw pain, especially after dental work -mouth sores -muscle cramps, stiffness, or weakness -redness, blistering, peeling or loosening of the skin, including inside the mouth -trouble passing urine or change in the amount of urine Side effects that usually do not require medical attention (report to your doctor or health care professional if they continue or are bothersome): -bone, joint, or muscle pain -constipation -diarrhea -fever -hair loss -irritation at site where injected -loss of appetite -nausea, vomiting -stomach upset -trouble sleeping -trouble swallowing -weak or tired This list may not describe all possible side effects. Call your doctor for medical advice about side effects. You may report side effects to FDA at 1-800-FDA-1088. Where should I keep my medicine? This drug is given in a hospital or  clinic and will not be stored at home. NOTE: This sheet is a summary. It may not cover all possible information. If you have questions about this medicine, talk to your doctor, pharmacist, or health care provider.  2018 [employer]vier/Gold Standard (2013-10-20 14:19:39)

## 2018-04-06 NOTE — Progress Notes (Signed)
[place] [phone]) [phone]   Fax:(336) [place], MD [address] [place] [address]  DIAGNOSIS: Stage IV (T1b, N2, M1b) non-small cell lung cancer, adenocarcinoma diagnosed in March 2017 and presented with right lower lobe lung nodule in addition to mediastinal lymphadenopathy and metastatic bone lesions.  Molecular studies: PDL 1 TPS  <1%.   Foundation One Studies: Positive for ERBB2 [id number]. Negative for EGFR, KRAS, ALK, BRAF, MET, RET and ROS1.  PRIOR THERAPY:  1) Induction systemic chemotherapy with carboplatin for AUC of 5 and Alimta 500 MG/M2 every 3 weeks is status post 6 cycles at the [facility], last dose was given 03/05/2016 with stable disease. 2) palliative radiation to the metastatic bone disease in the lower back and pelvic area.  CURRENT THERAPY::  1)  Maintenance systemic chemotherapy with Alimta 500 MG/M2 every 3 weeks status post 35 cycles. First dose was given 03/25/2016.  2) Zometa 4 mg IV every 12 week for metastatic bone disease.  INTERVAL HIST Valerie Long 72 y.o. female returns to the clinic today for follow-up visit accompanied by her sister.  The patient is feeling fine today with no concerning complaints except for low back pain.  She used CBD cream on that area as well as Tylenol if needed.  She denied having any chest pain, shortness breath, cough or hemoptysis.  She denied having any fever or chills.  She has no nausea, vomiting, diarrhea or constipation.  She has no recent weight loss or night sweats.  The patient continues to tolerate her treatment with Alimta fairly well.  She is here for evaluation after repeating CT scan of the chest, abdomen and pelvis for restaging of her disease.  MEDICAL HISTORY: Past Medical History:  Diagnosis Date  . Adenocarcinoma of right lung, stage 4 ([facility]) 2017  . Anemia   . Arthritis   . Bone metastases ([facility]) 02/24/2017   . Encounter for antineoplastic chemotherapy 03/17/2016  . Hypercholesterolemia   . Hypertension 06/18/2016  . Osteopenia   . 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 Pneumonia   . Shortness of breath dyspnea     ALLERGIES:  has No Known Allergies.  MEDICATIONS:  Current Outpatient Medications  Medication Sig Dispense Refill  . acetaminophen (TYLENOL) 500 MG tablet Take 1,000 mg by mouth every 6 (six) hours as needed for moderate pain or headache.    . baclofen (LIORESAL) 10 MG tablet Take 1 tablet (10 mg total) by mouth 3 (three) times daily. 30 each 0  . Calcium Carb-Cholecalciferol (CALCIUM 600/VITAMIN D3 PO) Take by mouth.    . Cyanocobalamin (B-12 PO) Take 2 tablets by mouth daily.    Marland Kitchen dexamethasone (DECADRON) 4 MG tablet Take '4mg'$  by mouth twice daily the day before, of, and after chemo 40 tablet 1  . Fish Oil-Cholecalciferol (OMEGA-3 + VITAMIN D3 PO) Take 1 tablet by mouth daily.    . folic acid (FOLVITE) 1 MG tablet Take 1 tablet (1 mg total) by mouth daily. 90 tablet 1  . ketorolac (ACULAR) 0.5 % ophthalmic solution Place 1 drop into both eyes 4 (four) times daily.  0  . lidocaine-prilocaine (EMLA) cream Apply 1 application topically as needed. Apply 1-2 tsp over port site 1-2 hours prior to chemo. 30 g 0  . meloxicam (MOBIC) 15 MG tablet TAKE 1 TABLET BY MOUTH EVERY DAY AS NEEDED FOR PAIN  30 tablet 0  . ofloxacin (OCUFLOX) 0.3 % ophthalmic solution Place 1 drop into both eyes 4 (four) times daily.  0  . ondansetron (ZOFRAN) 8 MG tablet Take 8 mg by mouth every 8 (eight) hours as needed.    Marland Kitchen OVER THE COUNTER MEDICATION Apply 1 drop topically daily as needed. 1 drop CBD oil in vicks vapor rub . Applied to right thigh for pain relief.    Marland Kitchen oxyCODONE-acetaminophen (PERCOCET/ROXICET) 5-325 MG tablet Take 1 tablet by mouth every 8 (eight) hours as needed for severe pain. 30 tablet 0  . prednisoLONE acetate (PRED FORTE) 1 % ophthalmic suspension Place 1 drop into both  eyes 4 (four) times daily.  0  . PRESCRIPTION MEDICATION Inject 1 Dose as directed every 21 ( twenty-one) days. Chemo    . prochlorperazine (COMPAZINE) 10 MG tablet Take 1 tablet (10 mg total) by mouth every 6 (six) hours as needed for nausea or vomiting. 30 tablet 0   No current facility-administered medications for this visit.    Facility-Administered Medications Ordered in Other Visits  Medication Dose Route Frequency Provider Last Rate Last Dose  . heparin lock flush 100 unit/mL  500 Units Intracatheter Once Curt Bears, MD        SURGICAL HISTORY:  Past Surgical History:  Procedure Laterality Date  . CESAREAN SECTION     myomectomy  . COLONOSCOPY W/ POLYPECTOMY    . IR GENERIC HISTORICAL  08/17/2016   IR US GUIDE VASC ACCESS RIGHT 08/17/2016 [name], MD [facility]  . IR GENERIC HISTORICAL  08/17/2016   IR FLUORO GUIDE PORT INSERTION RIGHT 08/17/2016 [name], MD [facility]  . MEDIASTINOSCOPY N/A 09/25/2015   Procedure: MEDIASTINOSCOPY;  Surgeon: Melrose Nakayama, MD;  Location: [facility];  Service: Thoracic;  Laterality: N/A;  . VIDEO BRONCHOSCOPY Bilateral 08/19/2015   Procedure: VIDEO BRONCHOSCOPY WITH FLUORO;  Surgeon: Rigoberto Noel, MD;  Location: [facility];  Service: Cardiopulmonary;  Laterality: Bilateral;  . VIDEO BRONCHOSCOPY N/A 09/08/2015   Procedure: VIDEO BRONCHOSCOPY WITH FLUORO;  Surgeon: Rigoberto Noel, MD;  Location: [facility];  Service: Thoracic;  Laterality: N/A;  . VIDEO BRONCHOSCOPY WITH ENDOBRONCHIAL ULTRASOUND Right 09/08/2015   Procedure: ATTEMPTED VIDEO BRONCHOSCOPY ENDOBRONCHIAL ULTRASOUND  ;  Surgeon: Rigoberto Noel, MD;  Location: [facility];  Service: Thoracic;  Laterality: Right;    REVIEW OF SYSTEMS:  Constitutional: positive for fatigue Eyes: negative Ears, nose, mouth, throat, and face: negative Respiratory: negative Cardiovascular: negative Gastrointestinal: negative Genitourinary:negative Integument/breast:  negative Hematologic/lymphatic: negative Musculoskeletal:positive for back pain Neurological: negative Behavioral/Psych: negative Endocrine: negative Allergic/Immunologic: negative   PHYSICAL EXAMINATION: General appearance: alert, cooperative, fatigued and no distress Head: Normocephalic, without obvious abnormality, atraumatic Neck: no adenopathy, no JVD, supple, symmetrical, trachea midline and thyroid not enlarged, symmetric, no tenderness/mass/nodules Lymph nodes: Cervical, supraclavicular, and axillary nodes normal. Resp: clear to auscultation bilaterally Back: symmetric, no curvature. ROM normal. No CVA tenderness. Cardio: regular rate and rhythm, S1, S2 normal, no murmur, click, rub or gallop GI: soft, non-tender; bowel sounds normal; no masses,  no organomegaly Extremities: extremities normal, atraumatic, no cyanosis or edema Neurologic: Alert and oriented X 3, normal strength and tone. Normal symmetric reflexes. Normal coordination and gait  ECOG PERFORMANCE STATUS: 1 - Symptomatic but completely ambulatory  Blood pressure 136/66, pulse 77, temperature 97.7 F (36.5 C), temperature source Oral, resp. rate 18, height '5\' 9"'$  (1.753 m), weight 150 lb 11.2 oz (68.4 kg), last menstrual period 06/09/1998, SpO2 100 %.  LABORATORY  DATA: Lab Results  Component Value Date   WBC 5.0 03/16/2018   HGB 10.0 (L) 03/16/2018   HCT 29.6 (L) 03/16/2018   MCV 99.7 03/16/2018   PLT 280 03/16/2018      Chemistry      Component Value Date/Time   NA 138 03/16/2018 0850   NA 137 06/09/2017 0901   K 3.9 03/16/2018 0850   K 3.9 06/09/2017 0901   CL 104 03/16/2018 0850   CO2 24 03/16/2018 0850   CO2 25 06/09/2017 0901   BUN 19 03/16/2018 0850   BUN 19.0 06/09/2017 0901   CREATININE 1.20 (H) 03/16/2018 0850   CREATININE 1.2 (H) 06/09/2017 0901   GLU 170 01/23/2016      Component Value Date/Time   CALCIUM 9.5 03/16/2018 0850   CALCIUM 9.9 06/09/2017 0901   ALKPHOS 66 03/16/2018 0850    ALKPHOS 63 06/09/2017 0901   AST 26 03/16/2018 0850   AST 35 (H) 06/09/2017 0901   ALT 12 03/16/2018 0850   ALT 23 06/09/2017 0901   BILITOT 0.6 03/16/2018 0850   BILITOT 0.65 06/09/2017 0901       RADIOGRAPHIC STUDIES: Ct Chest W Contrast  Result Date: 04/06/2018 CLINICAL DATA:  Non-small cell lung cancer. Evaluate response to therapy. Adenocarcinoma status post induction systemic chemotherapy. EXAM: CT CHEST, ABDOMEN, AND PELVIS WITH CONTRAST TECHNIQUE: Multidetector CT imaging of the chest, abdomen and pelvis was performed following the standard protocol during bolus administration of intravenous contrast. CONTRAST:  142m OMNIPAQUE IOHEXOL 300 MG/ML  SOLN COMPARISON:  CT 02/01/2018 FINDINGS: CT CHEST FINDINGS Cardiovascular: Port in the anterior chest wall with tip in distal SVC. No significant vascular findings. Normal heart size. No pericardial effusion. Mediastinum/Nodes: No axillary supraclavicular adenopathy. No mediastinal hilar adenopathy. No pericardial effusion. Esophagus normal. Lungs/Pleura: Bilateral pulmonary nodules again demonstrated. Nodules appear stable in the interval. The larger nodular conglomeration in the RIGHT lower lobe measures 23 x 18 mm (image 76/6) compared to 24 x 18 mm prior for no change. Example nodule in the RIGHT upper lobe measures 9 mm (image 41/6) compared with 9 mm on prior. Example nodule in the superior segment of the RIGHT lower lobe measures 8 mm (image 42/6) compared 8 mm. Example nodule in the LEFT upper lobe measures 5 mm (image 35/6) compared to 6 mm. No new pulmonary nodules are present. Musculoskeletal: Multiple densely sclerotic lesions throughout the spine unchanged in number pattern. CT ABDOMEN AND PELVIS FINDINGS Hepatobiliary: No focal hepatic lesion.  Gallstones noted. Pancreas: Pancreas is normal. No ductal dilatation. No pancreatic inflammation. Spleen: Normal spleen Adrenals/urinary tract: Adrenal glands normal. RIGHT kidney normal. 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 Ureters and bladder normal. Stomach/Bowel: Stomach, small bowel, appendix, and cecum are normal. The colon and rectosigmoid colon are normal. Vascular/Lymphatic: Abdominal aorta is normal caliber. There is no retroperitoneal or periportal lymphadenopathy. No pelvic lymphadenopathy. Reproductive: Uterus and adnexa normal. Other: No peritoneal metastasis Musculoskeletal: Multiple sclerotic lesions through the pelvis and spine unchanged in size or number. IMPRESSION: Chest Impression: 1. Stable bilateral pulmonary nodules. Largest nodular complex in the RIGHT lower lobe is stable. 2. No mediastinal lymphadenopathy. 3. Stable sclerotic metastasis. Abdomen / Pelvis Impression: 1. No evidence of soft tissue metastasis in the abdomen pelvis. 2. Stable sclerotic skeletal metastasis in the pelvis and spine. Electronically Signed   By: SSuzy BouchardM.D.   On: 04/06/2018 09:30   Ct Abdomen Pelvis W Contrast  Result Date: 04/06/2018 CLINICAL DATA:  Non-small cell  lung cancer. Evaluate response to therapy. Adenocarcinoma status post induction systemic chemotherapy. EXAM: CT CHEST, ABDOMEN, AND PELVIS WITH CONTRAST TECHNIQUE: Multidetector CT imaging of the chest, abdomen and pelvis was performed following the standard protocol during bolus administration of intravenous contrast. CONTRAST:  159m OMNIPAQUE IOHEXOL 300 MG/ML  SOLN COMPARISON:  CT 02/01/2018 FINDINGS: CT CHEST FINDINGS Cardiovascular: Port in the anterior chest wall with tip in distal SVC. No significant vascular findings. Normal heart size. No pericardial effusion. Mediastinum/Nodes: No axillary supraclavicular adenopathy. No mediastinal hilar adenopathy. No pericardial effusion. Esophagus normal. Lungs/Pleura: Bilateral pulmonary nodules again demonstrated. Nodules appear stable in the interval. The larger nodular conglomeration in the RIGHT lower lobe measures 23 x 18 mm (image 76/6) compared to 24 x 18  mm prior for no change. Example nodule in the RIGHT upper lobe measures 9 mm (image 41/6) compared with 9 mm on prior. Example nodule in the superior segment of the RIGHT lower lobe measures 8 mm (image 42/6) compared 8 mm. Example nodule in the LEFT upper lobe measures 5 mm (image 35/6) compared to 6 mm. No new pulmonary nodules are present. Musculoskeletal: Multiple densely sclerotic lesions throughout the spine unchanged in number pattern. CT ABDOMEN AND PELVIS FINDINGS Hepatobiliary: No focal hepatic lesion.  Gallstones noted. Pancreas: Pancreas is normal. No ductal dilatation. No pancreatic inflammation. Spleen: Normal spleen Adrenals/urinary tract: Adrenal glands normal. RIGHT kidney normal. 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 Ureters and bladder normal. Stomach/Bowel: Stomach, small bowel, appendix, and cecum are normal. The colon and rectosigmoid colon are normal. Vascular/Lymphatic: Abdominal aorta is normal caliber. There is no retroperitoneal or periportal lymphadenopathy. No pelvic lymphadenopathy. Reproductive: Uterus and adnexa normal. Other: No peritoneal metastasis Musculoskeletal: Multiple sclerotic lesions through the pelvis and spine unchanged in size or number. IMPRESSION: Chest Impression: 1. Stable bilateral pulmonary nodules. Largest nodular complex in the RIGHT lower lobe is stable. 2. No mediastinal lymphadenopathy. 3. Stable sclerotic metastasis. Abdomen / Pelvis Impression: 1. No evidence of soft tissue metastasis in the abdomen pelvis. 2. Stable sclerotic skeletal metastasis in the pelvis and spine. Electronically Signed   By: SSuzy BouchardM.D.   On: 04/06/2018 09:30    ASSESSMENT AND PLAN:  This is a very pleasant 72years old Hispanic female with metastatic non-small cell lung cancer, adenocarcinoma status post induction systemic chemotherapy with carboplatin and Alimta and she is currently on maintenance treatment with single agent Alimta status post 35  cycles. The patient continues to tolerate this treatment well with no concerning adverse effects. She had repeat CT scan of the chest, abdomen and pelvis performed recently.  I personally and independently reviewed the scan images and discussed the results with the patient and her sister.  I did not see any clear finding for disease progression. I recommended for the patient to continue her current treatment with maintenance Alimta and she will proceed with cycle #36 today. I will see her back for follow-up visit in 3 weeks for evaluation before starting cycle #37. She was advised to call immediately if she has any concerning symptoms in the interval. The patient voices understanding of current disease status and treatment options and is in agreement with the current care plan. All questions were answered. The patient knows to call the clinic with any problems, questions or concerns. We can certainly see the patient much sooner if necessary.  Disclaimer: This note was dictated with voice recognition software. Similar sounding words can inadvertently be transcribed and may not be corrected upon  review.

## 2018-04-07 ENCOUNTER — Telehealth: Payer: Self-pay | Admitting: Internal Medicine

## 2018-04-07 NOTE — Telephone Encounter (Signed)
Scheduled appt per 10/31 los - pt to get an updated schedule next visit.

## 2018-04-12 DIAGNOSIS — K5900 Constipation, unspecified: Secondary | ICD-10-CM | POA: Diagnosis not present

## 2018-04-12 DIAGNOSIS — C7951 Secondary malignant neoplasm of bone: Secondary | ICD-10-CM | POA: Diagnosis not present

## 2018-04-12 DIAGNOSIS — R918 Other nonspecific abnormal finding of lung field: Secondary | ICD-10-CM | POA: Diagnosis not present

## 2018-04-12 DIAGNOSIS — K8020 Calculus of gallbladder without cholecystitis without obstruction: Secondary | ICD-10-CM | POA: Diagnosis not present

## 2018-04-12 DIAGNOSIS — R0602 Shortness of breath: Secondary | ICD-10-CM | POA: Diagnosis not present

## 2018-04-12 DIAGNOSIS — H269 Unspecified cataract: Secondary | ICD-10-CM | POA: Diagnosis not present

## 2018-04-12 DIAGNOSIS — C801 Malignant (primary) neoplasm, unspecified: Secondary | ICD-10-CM | POA: Diagnosis not present

## 2018-04-12 DIAGNOSIS — C3490 Malignant neoplasm of unspecified part of unspecified bronchus or lung: Secondary | ICD-10-CM | POA: Diagnosis not present

## 2018-04-12 DIAGNOSIS — R109 Unspecified abdominal pain: Secondary | ICD-10-CM | POA: Diagnosis not present

## 2018-04-12 DIAGNOSIS — Z79899 Other long term (current) drug therapy: Secondary | ICD-10-CM | POA: Diagnosis not present

## 2018-04-12 DIAGNOSIS — Z9889 Other specified postprocedural states: Secondary | ICD-10-CM | POA: Diagnosis not present

## 2018-04-12 MED ORDER — IOPAMIDOL (ISOVUE-370) INJECTION 76%
100.00 | INTRAVENOUS | Status: DC
Start: ? — End: 2018-04-12

## 2018-04-17 ENCOUNTER — Other Ambulatory Visit: Payer: Self-pay | Admitting: Gynecology

## 2018-04-17 ENCOUNTER — Ambulatory Visit
Admission: RE | Admit: 2018-04-17 | Discharge: 2018-04-17 | Disposition: A | Discharge status: Home / Self Care | Payer: Medicare Other | Source: Ambulatory Visit | Attending: Family Medicine | Admitting: Family Medicine

## 2018-04-17 DIAGNOSIS — Z1231 Encounter for screening mammogram for malignant neoplasm of breast: Secondary | ICD-10-CM

## 2018-04-27 ENCOUNTER — Encounter: Payer: Self-pay | Admitting: Oncology

## 2018-04-27 ENCOUNTER — Inpatient Hospital Stay: Payer: Medicare Other

## 2018-04-27 ENCOUNTER — Other Ambulatory Visit: Payer: Self-pay | Admitting: Oncology

## 2018-04-27 ENCOUNTER — Inpatient Hospital Stay: Payer: Medicare Other | Attending: Internal Medicine

## 2018-04-27 ENCOUNTER — Inpatient Hospital Stay (HOSPITAL_BASED_OUTPATIENT_CLINIC_OR_DEPARTMENT_OTHER): Payer: Medicare Other | Admitting: Oncology

## 2018-04-27 ENCOUNTER — Telehealth: Payer: Self-pay

## 2018-04-27 VITALS — BP 143/94 | HR 68 | Temp 98.4°F | Resp 18 | Ht 69.0 in | Wt 151.2 lb

## 2018-04-27 DIAGNOSIS — T451X5A Adverse effect of antineoplastic and immunosuppressive drugs, initial encounter: Secondary | ICD-10-CM

## 2018-04-27 DIAGNOSIS — D6481 Anemia due to antineoplastic chemotherapy: Secondary | ICD-10-CM

## 2018-04-27 DIAGNOSIS — Z79899 Other long term (current) drug therapy: Secondary | ICD-10-CM | POA: Diagnosis not present

## 2018-04-27 DIAGNOSIS — C3491 Malignant neoplasm of unspecified part of right bronchus or lung: Secondary | ICD-10-CM

## 2018-04-27 DIAGNOSIS — Z5111 Encounter for antineoplastic chemotherapy: Secondary | ICD-10-CM | POA: Insufficient documentation

## 2018-04-27 DIAGNOSIS — R05 Cough: Secondary | ICD-10-CM | POA: Diagnosis not present

## 2018-04-27 DIAGNOSIS — C3431 Malignant neoplasm of lower lobe, right bronchus or lung: Secondary | ICD-10-CM | POA: Insufficient documentation

## 2018-04-27 DIAGNOSIS — C7951 Secondary malignant neoplasm of bone: Secondary | ICD-10-CM | POA: Insufficient documentation

## 2018-04-27 LAB — CMP (CANCER CENTER ONLY)
ALT: 13 U/L (ref 0–44)
AST: 30 U/L (ref 15–41)
Albumin: 3.3 g/dL — ABNORMAL LOW (ref 3.5–5.0)
Alkaline Phosphatase: 58 U/L (ref 38–126)
Anion gap: 8 (ref 5–15)
BUN: 20 mg/dL (ref 8–23)
CO2: 24 mmol/L (ref 22–32)
Calcium: 9.2 mg/dL (ref 8.9–10.3)
Chloride: 105 mmol/L (ref 98–111)
Creatinine: 1.13 mg/dL — ABNORMAL HIGH (ref 0.44–1.00)
GFR, Est AFR Am: 56 mL/min — ABNORMAL LOW (ref >60)
GFR, Estimated: 48 mL/min — ABNORMAL LOW (ref >60)
Glucose, Bld: 103 mg/dL — ABNORMAL HIGH (ref 70–99)
Potassium: 4 mmol/L (ref 3.5–5.1)
Sodium: 137 mmol/L (ref 135–145)
Total Bilirubin: 0.6 mg/dL (ref 0.3–1.2)
Total Protein: 6.6 g/dL (ref 6.5–8.1)

## 2018-04-27 LAB — CBC WITH DIFFERENTIAL (CANCER CENTER ONLY)
Abs Immature Granulocytes: 0.02 10*3/uL (ref 0.00–0.07)
Basophils Absolute: 0 10*3/uL (ref 0.0–0.1)
Basophils Relative: 0 %
Eosinophils Absolute: 0 10*3/uL (ref 0.0–0.5)
Eosinophils Relative: 0 %
HCT: 28.7 % — ABNORMAL LOW (ref 36.0–46.0)
Hemoglobin: 9.5 g/dL — ABNORMAL LOW (ref 12.0–15.0)
Immature Granulocytes: 0 %
Lymphocytes Relative: 16 %
Lymphs Abs: 0.8 10*3/uL (ref 0.7–4.0)
MCH: 33.7 pg (ref 26.0–34.0)
MCHC: 33.1 g/dL (ref 30.0–36.0)
MCV: 101.8 fL — ABNORMAL HIGH (ref 80.0–100.0)
Monocytes Absolute: 0.6 10*3/uL (ref 0.1–1.0)
Monocytes Relative: 13 %
Neutro Abs: 3.4 10*3/uL (ref 1.7–7.7)
Neutrophils Relative %: 71 %
Platelet Count: 247 10*3/uL (ref 150–400)
RBC: 2.82 MIL/uL — ABNORMAL LOW (ref 3.87–5.11)
RDW: 14.4 % (ref 11.5–15.5)
WBC Count: 4.8 10*3/uL (ref 4.0–10.5)
nRBC: 0 % (ref 0.0–0.2)

## 2018-04-27 MED ORDER — SODIUM CHLORIDE 0.9 % IV SOLN
Freq: Once | INTRAVENOUS | Status: AC
  Administered 2018-04-27: 11:00:00 via INTRAVENOUS
  Filled 2018-04-27: qty 250

## 2018-04-27 MED ORDER — HEPARIN SOD (PORK) LOCK FLUSH 100 UNIT/ML IV SOLN
500.0000 [IU] | Freq: Once | INTRAVENOUS | Status: AC | PRN
  Administered 2018-04-27: 500 [IU]
  Filled 2018-04-27: qty 5

## 2018-04-27 MED ORDER — PROCHLORPERAZINE MALEATE 10 MG PO TABS
10.0000 mg | ORAL_TABLET | Freq: Once | ORAL | Status: AC
  Administered 2018-04-27: 10 mg via ORAL

## 2018-04-27 MED ORDER — DEXAMETHASONE 4 MG PO TABS: ORAL_TABLET | ORAL | 1 refills | Status: DC

## 2018-04-27 MED ORDER — CYANOCOBALAMIN 1000 MCG/ML IJ SOLN
INTRAMUSCULAR | Status: AC
  Filled 2018-04-27: qty 1

## 2018-04-27 MED ORDER — CYANOCOBALAMIN 1000 MCG/ML IJ SOLN
1000.0000 ug | Freq: Once | INTRAMUSCULAR | Status: AC
  Administered 2018-04-27: 1000 ug via INTRAMUSCULAR

## 2018-04-27 MED ORDER — PROCHLORPERAZINE MALEATE 10 MG PO TABS
ORAL_TABLET | ORAL | Status: AC
  Filled 2018-04-27: qty 1

## 2018-04-27 MED ORDER — SODIUM CHLORIDE 0.9 % IV SOLN
1000.0000 mg | Freq: Once | INTRAVENOUS | Status: AC
  Administered 2018-04-27: 1000 mg via INTRAVENOUS
  Filled 2018-04-27: qty 40

## 2018-04-27 MED ORDER — SODIUM CHLORIDE 0.9% FLUSH
10.0000 mL | INTRAVENOUS | Status: DC | PRN
  Administered 2018-04-27: 10 mL
  Filled 2018-04-27: qty 10

## 2018-04-27 MED ORDER — BENZONATATE 100 MG PO CAPS: 100.0000 mg | ORAL_CAPSULE | Freq: Three times a day (TID) | ORAL | 1 refills | Status: DC | PRN

## 2018-04-27 NOTE — Telephone Encounter (Signed)
Patient declined calender and avs. Per 11/21 los

## 2018-04-27 NOTE — Patient Instructions (Signed)
[facility] Discharge Instructions for Patients Receiving Chemotherapy  Today you received the following chemotherapy agents Pemetrexed (ALIMTA).  To help prevent nausea and vomiting after your treatment, we encourage you to take your nausea medication as prescribed.   If you develop nausea and vomiting that is not controlled by your nausea medication, call the clinic.   BELOW ARE SYMPTOMS THAT SHOULD BE REPORTED IMMEDIATELY:  *FEVER GREATER THAN 100.5 F  *CHILLS WITH OR WITHOUT FEVER  NAUSEA AND VOMITING THAT IS NOT CONTROLLED WITH YOUR NAUSEA MEDICATION  *UNUSUAL SHORTNESS OF BREATH  *UNUSUAL BRUISING OR BLEEDING  TENDERNESS IN MOUTH AND THROAT WITH OR WITHOUT PRESENCE OF ULCERS  *URINARY PROBLEMS  *BOWEL PROBLEMS  UNUSUAL RASH Items with * indicate a potential emergency and should be followed up as soon as possible.  Feel free to call the clinic should you have any questions or concerns. The clinic phone number is [phone].  Please show the [facility] at check-in to the Emergency Department and triage nurse.

## 2018-04-27 NOTE — Progress Notes (Signed)
[place], MD [address] [place] [address]  DIAGNOSIS: Stage IV (T1b, N2, M1b) non-small cell lung cancer, adenocarcinoma diagnosed in March 2017 and presented with right lower lobe lung nodule in addition to mediastinal lymphadenopathy and metastatic bone lesions.  Molecular studies:PDL 1 TPS <1%.   [employer] Studies: Positive for ERBB2 [id number]. Negative for EGFR, KRAS, ALK, BRAF, MET, RET and ROS1.  PRIOR THERAPY:  1) Induction systemic chemotherapy with carboplatin for AUC of 5 and Alimta 500 MG/M2 every 3 weeks is status post 6 cycles at the [facility], last dose was given 03/05/2016 with stable disease. 2) palliative radiation to the metastatic bone disease in the lower back and pelvic area.  CURRENT THERAPY::  1)  Maintenance systemic chemotherapy with Alimta 500 MG/M2 every 3 weeks status post 36 cycles. First dose was given 03/25/2016.  2) Zometa 4 mg IV every 12 week for metastatic bone disease.  INTERVAL HISTORY: Valerie Long 72 y.o. female returns for routine follow-up visit accompanied by her sister.  Patient is feeling fine today and has no specific complaints except for her baseline pain which is unchanged.  She was seen in the emergency room a [facility] since her last visit for cough and shortness of breath.  CT angiogram was performed and was negative for PE.  No acute findings were noted.  She reports that her breathing is better.  Cough is improved.  She uses [employer] which are effective.  Requests a refill on this today.  Denies fevers and chills.  Denies chest pain, shortness of breath, hemoptysis.  Denies nausea, vomiting, constipation, diarrhea.  Denies recent weight loss or night sweats.  The patient is here for evaluation prior to cycle #37 of her treatment.  MEDICAL HISTORY: Past Medical History:  Diagnosis Date  . Adenocarcinoma of right lung, stage 4 ([place])  2017  . Anemia   . Arthritis   . Bone metastases ([place]) 02/24/2017  . Encounter for antineoplastic chemotherapy 03/17/2016  . Hypercholesterolemia   . Hypertension 06/18/2016  . Osteopenia   . 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 Pneumonia   . Shortness of breath dyspnea     ALLERGIES:  has No Known Allergies.  MEDICATIONS:  Current Outpatient Medications  Medication Sig Dispense Refill  . acetaminophen (TYLENOL) 500 MG tablet Take 1,000 mg by mouth every 6 (six) hours as needed for moderate pain or headache.    . baclofen (LIORESAL) 10 MG tablet Take 1 tablet (10 mg total) by mouth 3 (three) times daily. 30 each 0  . benzonatate (TESSALON) 100 MG capsule Take 1 capsule (100 mg total) by mouth 3 (three) times daily as needed for cough. 40 capsule 1  . Calcium Carb-Cholecalciferol (CALCIUM 600/VITAMIN D3 PO) Take by mouth.    . Cyanocobalamin (B-12 PO) Take 2 tablets by mouth daily.    Marland Kitchen dexamethasone (DECADRON) 4 MG tablet Take 73m by mouth twice daily the day before, of, and after chemo 40 tablet 1  . Fish Oil-Cholecalciferol (OMEGA-3 + VITAMIN D3 PO) Take 1 tablet by mouth daily.    . folic acid (FOLVITE) 1 MG tablet Take 1 tablet (1 mg total) by mouth daily. 90 tablet 1  . ketorolac (ACULAR) 0.5 % ophthalmic solution Place 1 drop into both eyes 4 (four) times daily.  0  . lidocaine-prilocaine (EMLA) cream Apply 1 application topically as needed. Apply  1-2 tsp over port site 1-2 hours prior to chemo. 30 g 0  . meloxicam (MOBIC) 15 MG tablet TAKE 1 TABLET BY MOUTH EVERY DAY AS NEEDED FOR PAIN 30 tablet 0  . neomycin-polymyxin b-dexamethasone (MAXITROL) 3.5-10000-0.1 SUSP INSTILL 1 DROP BY OPHTHALMIC ROUTE 4 TIMES PER DAY INTO BOTH EYES.  1  . ofloxacin (OCUFLOX) 0.3 % ophthalmic solution Place 1 drop into both eyes 4 (four) times daily.  0  . ondansetron (ZOFRAN) 8 MG tablet Take 8 mg by mouth every 8 (eight) hours as needed.    Marland Kitchen OVER THE COUNTER MEDICATION Apply 1  drop topically daily as needed. 1 drop CBD oil in vicks vapor rub . Applied to right thigh for pain relief.    Marland Kitchen oxyCODONE-acetaminophen (PERCOCET/ROXICET) 5-325 MG tablet Take 1 tablet by mouth every 8 (eight) hours as needed for severe pain. 30 tablet 0  . prednisoLONE acetate (PRED FORTE) 1 % ophthalmic suspension Place 1 drop into both eyes 4 (four) times daily.  0  . PRESCRIPTION MEDICATION Inject 1 Dose as directed every 21 ( twenty-one) days. Chemo    . prochlorperazine (COMPAZINE) 10 MG tablet Take 1 tablet (10 mg total) by mouth every 6 (six) hours as needed for nausea or vomiting. 30 tablet 0   No current facility-administered medications for this visit.    Facility-Administered Medications Ordered in Other Visits  Medication Dose Route Frequency Provider Last Rate Last Dose  . sodium chloride flush (NS) 0.9 % injection 10 mL  10 mL Intracatheter PRN Curt Bears, MD   10 mL at 04/27/18 1209    SURGICAL HISTORY:  Past Surgical History:  Procedure Laterality Date  . CESAREAN SECTION     myomectomy  . COLONOSCOPY W/ POLYPECTOMY    . IR GENERIC HISTORICAL  08/17/2016   IR US GUIDE VASC ACCESS RIGHT 08/17/2016 [name], MD [facility]INTER[facility]  . IR GENERIC HISTORICAL  08/17/2016   IR FLUORO GUIDE PORT INSERTION RIGHT 08/17/2016 [name], MD [facility]  . MEDIASTINOSCOPY N/A 09/25/2015   Procedure: MEDIASTINOSCOPY;  Surgeon: Melrose Nakayama, MD;  Location: [facility];  Service: Thoracic;  Laterality: N/A;  . VIDEO BRONCHOSCOPY Bilateral 08/19/2015   Procedure: VIDEO BRONCHOSCOPY WITH FLUORO;  Surgeon: Rigoberto Noel, MD;  Location: [facility];  Service: Cardiopulmonary;  Laterality: Bilateral;  . VIDEO BRONCHOSCOPY N/A 09/08/2015   Procedure: VIDEO BRONCHOSCOPY WITH FLUORO;  Surgeon: Rigoberto Noel, MD;  Location: [facility];  Service: Thoracic;  Laterality: N/A;  . VIDEO BRONCHOSCOPY WITH ENDOBRONCHIAL ULTRASOUND Right 09/08/2015   Procedure: ATTEMPTED VIDEO BRONCHOSCOPY  ENDOBRONCHIAL ULTRASOUND  ;  Surgeon: Rigoberto Noel, MD;  Location: [facility];  Service: Thoracic;  Laterality: Right;    REVIEW OF SYSTEMS:   Review of Systems  Constitutional: Negative for appetite change, chills, fatigue, fever and unexpected weight change.  HENT:   Negative for mouth sores, nosebleeds, sore throat and trouble swallowing.   Eyes: Negative for eye problems and icterus.  Respiratory: Negative for hemoptysis, shortness of breath and wheezing.  Positive for cough. Cardiovascular: Negative for chest pain and leg swelling.  Gastrointestinal: Negative for abdominal pain, constipation, diarrhea, nausea and vomiting.  Genitourinary: Negative for bladder incontinence, difficulty urinating, dysuria, frequency and hematuria.   Musculoskeletal: Negative for back pain, gait problem, neck pain and neck stiffness.  Skin: Negative for itching and rash.  Neurological: Negative for dizziness, extremity weakness, gait problem, headaches, light-headedness and seizures.  Hematological: Negative for adenopathy. Does not bruise/bleed easily.  Psychiatric/Behavioral:  Negative for confusion, depression and sleep disturbance. The patient is not nervous/anxious.     PHYSICAL EXAMINATION:  Blood pressure (!) 143/94, pulse 68, temperature 98.4 F (36.9 C), temperature source Oral, resp. rate 18, height _0  (1.753 m), weight 151 lb 3.2 oz (68.6 kg), last menstrual period 06/09/1998, SpO2 100 %.  ECOG PERFORMANCE STATUS: 1 - Symptomatic but completely ambulatory  Physical Exam  Constitutional: Oriented to person, place, and time and well-developed, well-nourished, and in no distress. No distress.  HENT:  Head: Normocephalic and atraumatic.  Mouth/Throat: Oropharynx is clear and moist. No oropharyngeal exudate.  Eyes: Conjunctivae are normal. Right eye exhibits no discharge. Left eye exhibits no discharge. No scleral icterus.  Neck: Normal range of motion. Neck supple.  Cardiovascular: Normal rate,  regular rhythm, normal heart sounds and intact distal pulses.   Pulmonary/Chest: Effort normal and breath sounds normal. No respiratory distress. No wheezes. No rales.  Abdominal: Soft. Bowel sounds are normal. Exhibits no distension and no mass. There is no tenderness.  Musculoskeletal: Normal range of motion. Exhibits no edema.  Lymphadenopathy:    No cervical adenopathy.  Neurological: Alert and oriented to person, place, and time. Exhibits normal muscle tone. Gait normal. Coordination normal.  Skin: Skin is warm and dry. No rash noted. Not diaphoretic. No erythema. No pallor.  Psychiatric: Mood, memory and judgment normal.  Vitals reviewed.  LABORATORY DATA: Lab Results  Component Value Date   WBC 4.8 04/27/2018   HGB 9.5 (L) 04/27/2018   HCT 28.7 (L) 04/27/2018   MCV 101.8 (H) 04/27/2018   PLT 247 04/27/2018      Chemistry      Component Value Date/Time   NA 137 04/27/2018 0943   NA 137 06/09/2017 0901   K 4.0 04/27/2018 0943   K 3.9 06/09/2017 0901   CL 105 04/27/2018 0943   CO2 24 04/27/2018 0943   CO2 25 06/09/2017 0901   BUN 20 04/27/2018 0943   BUN 19.0 06/09/2017 0901   CREATININE 1.13 (H) 04/27/2018 0943   CREATININE 1.2 (H) 06/09/2017 0901   GLU 170 01/23/2016      Component Value Date/Time   CALCIUM 9.2 04/27/2018 0943   CALCIUM 9.9 06/09/2017 0901   ALKPHOS 58 04/27/2018 0943   ALKPHOS 63 06/09/2017 0901   AST 30 04/27/2018 0943   AST 35 (H) 06/09/2017 0901   ALT 13 04/27/2018 0943   ALT 23 06/09/2017 0901   BILITOT 0.6 04/27/2018 0943   BILITOT 0.65 06/09/2017 0901       RADIOGRAPHIC STUDIES:  Ct Chest W Contrast  Result Date: 04/06/2018 CLINICAL DATA:  Non-small cell lung cancer. Evaluate response to therapy. Adenocarcinoma status post induction systemic chemotherapy. EXAM: CT CHEST, ABDOMEN, AND PELVIS WITH CONTRAST TECHNIQUE: Multidetector CT imaging of the chest, abdomen and pelvis was performed following the standard protocol during bolus  administration of intravenous contrast. CONTRAST:  178m OMNIPAQUE IOHEXOL 300 MG/ML  SOLN COMPARISON:  CT 02/01/2018 FINDINGS: CT CHEST FINDINGS Cardiovascular: Port in the anterior chest wall with tip in distal SVC. No significant vascular findings. Normal heart size. No pericardial effusion. Mediastinum/Nodes: No axillary supraclavicular adenopathy. No mediastinal hilar adenopathy. No pericardial effusion. Esophagus normal. Lungs/Pleura: Bilateral pulmonary nodules again demonstrated. Nodules appear stable in the interval. The larger nodular conglomeration in the RIGHT lower lobe measures 23 x 18 mm (image 76/6) compared to 24 x 18 mm prior for no change. Example nodule in the RIGHT upper lobe measures 9 mm (image 41/6) compared with 9  mm on prior. Example nodule in the superior segment of the RIGHT lower lobe measures 8 mm (image 42/6) compared 8 mm. Example nodule in the LEFT upper lobe measures 5 mm (image 35/6) compared to 6 mm. No new pulmonary nodules are present. Musculoskeletal: Multiple densely sclerotic lesions throughout the spine unchanged in number pattern. CT ABDOMEN AND PELVIS FINDINGS Hepatobiliary: No focal hepatic lesion.  Gallstones noted. Pancreas: Pancreas is normal. No ductal dilatation. No pancreatic inflammation. Spleen: Normal spleen Adrenals/urinary tract: Adrenal glands normal. RIGHT kidney normal. 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 Ureters and bladder normal. Stomach/Bowel: Stomach, small bowel, appendix, and cecum are normal. The colon and rectosigmoid colon are normal. Vascular/Lymphatic: Abdominal aorta is normal caliber. There is no retroperitoneal or periportal lymphadenopathy. No pelvic lymphadenopathy. Reproductive: Uterus and adnexa normal. Other: No peritoneal metastasis Musculoskeletal: Multiple sclerotic lesions through the pelvis and spine unchanged in size or number. IMPRESSION: Chest Impression: 1. Stable bilateral pulmonary nodules. Largest nodular  complex in the RIGHT lower lobe is stable. 2. No mediastinal lymphadenopathy. 3. Stable sclerotic metastasis. Abdomen / Pelvis Impression: 1. No evidence of soft tissue metastasis in the abdomen pelvis. 2. Stable sclerotic skeletal metastasis in the pelvis and spine. Electronically Signed   By: Suzy Bouchard M.D.   On: 04/06/2018 09:30   Ct Abdomen Pelvis W Contrast  Result Date: 04/06/2018 CLINICAL DATA:  Non-small cell lung cancer. Evaluate response to therapy. Adenocarcinoma status post induction systemic chemotherapy. EXAM: CT CHEST, ABDOMEN, AND PELVIS WITH CONTRAST TECHNIQUE: Multidetector CT imaging of the chest, abdomen and pelvis was performed following the standard protocol during bolus administration of intravenous contrast. CONTRAST:  129m OMNIPAQUE IOHEXOL 300 MG/ML  SOLN COMPARISON:  CT 02/01/2018 FINDINGS: CT CHEST FINDINGS Cardiovascular: Port in the anterior chest wall with tip in distal SVC. No significant vascular findings. Normal heart size. No pericardial effusion. Mediastinum/Nodes: No axillary supraclavicular adenopathy. No mediastinal hilar adenopathy. No pericardial effusion. Esophagus normal. Lungs/Pleura: Bilateral pulmonary nodules again demonstrated. Nodules appear stable in the interval. The larger nodular conglomeration in the RIGHT lower lobe measures 23 x 18 mm (image 76/6) compared to 24 x 18 mm prior for no change. Example nodule in the RIGHT upper lobe measures 9 mm (image 41/6) compared with 9 mm on prior. Example nodule in the superior segment of the RIGHT lower lobe measures 8 mm (image 42/6) compared 8 mm. Example nodule in the LEFT upper lobe measures 5 mm (image 35/6) compared to 6 mm. No new pulmonary nodules are present. Musculoskeletal: Multiple densely sclerotic lesions throughout the spine unchanged in number pattern. CT ABDOMEN AND PELVIS FINDINGS Hepatobiliary: No focal hepatic lesion.  Gallstones noted. Pancreas: Pancreas is normal. No ductal dilatation. No  pancreatic inflammation. Spleen: Normal spleen Adrenals/urinary tract: Adrenal glands normal. RIGHT kidney normal. 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 Ureters and bladder normal. Stomach/Bowel: Stomach, small bowel, appendix, and cecum are normal. The colon and rectosigmoid colon are normal. Vascular/Lymphatic: Abdominal aorta is normal caliber. There is no retroperitoneal or periportal lymphadenopathy. No pelvic lymphadenopathy. Reproductive: Uterus and adnexa normal. Other: No peritoneal metastasis Musculoskeletal: Multiple sclerotic lesions through the pelvis and spine unchanged in size or number. IMPRESSION: Chest Impression: 1. Stable bilateral pulmonary nodules. Largest nodular complex in the RIGHT lower lobe is stable. 2. No mediastinal lymphadenopathy. 3. Stable sclerotic metastasis. Abdomen / Pelvis Impression: 1. No evidence of soft tissue metastasis in the abdomen pelvis. 2. Stable sclerotic skeletal metastasis in the pelvis and spine. Electronically Signed  By: Suzy Bouchard M.D.   On: 04/06/2018 09:30   Mm 3d Screen Breast Bilateral  Result Date: 04/18/2018 CLINICAL DATA:  Screening. EXAM: DIGITAL SCREENING BILATERAL MAMMOGRAM WITH TOMO AND CAD COMPARISON:  Previous exam(s). ACR Breast Density Category b: There are scattered areas of fibroglandular density. FINDINGS: There are no findings suspicious for malignancy. Images were processed with CAD. IMPRESSION: No mammographic evidence of malignancy. A result letter of this screening mammogram will be mailed directly to the patient. RECOMMENDATION: Screening mammogram in one year. (Code:SM-B-01Y) BI-RADS CATEGORY  1: Negative. Electronically Signed   By: Margarette Canada M.D.   On: 04/18/2018 16:06     ASSESSMENT/PLAN:  Adenocarcinoma of right lung, stage 4 ([facility]) This is a very pleasant 72 year old Hispanic female with metastatic non-small cell lung cancer, adenocarcinoma status post induction systemic chemotherapy with  carboplatin and Alimta and she is currently on maintenance treatment with single agent Alimta status post 36 cycles. The patient continues to tolerate this treatment well with no concerning adverse effects.  Recommend for her to proceed with cycle #37 of her treatment today as scheduled.  She will follow-up in 3 weeks for evaluation prior to cycle #38.  For cough, I have given her a refill on Tessalon Perles.  She will let us know if her cough worsens.  She was advised to call immediately if she has any concerning symptoms in the interval. The patient voices understanding of current disease status and treatment options and is in agreement with the current care plan. All questions were answered. The patient knows to call the clinic with any problems, questions or concerns. We can certainly see the patient much sooner if necessary.   No orders of the defined types were placed in this encounter.    Mikey Bussing, DNP, AGPCNP-BC, AOCNP 04/27/18

## 2018-04-27 NOTE — Assessment & Plan Note (Addendum)
This is a very pleasant 73 year old Hispanic female with metastatic non-small cell lung cancer, adenocarcinoma status post induction systemic chemotherapy with carboplatin and Alimta and she is currently on maintenance treatment with single agent Alimta status post 36 cycles. The patient continues to tolerate this treatment well with no concerning adverse effects.  Recommend for her to proceed with cycle #37 of her treatment today as scheduled.  She will follow-up in 3 weeks for evaluation prior to cycle #38.  For cough, I have given her a refill on Tessalon Perles.  She will let us know if her cough worsens.  She was advised to call immediately if she has any concerning symptoms in the interval. The patient voices understanding of current disease status and treatment options and is in agreement with the current care plan. All questions were answered. The patient knows to call the clinic with any problems, questions or concerns. We can certainly see the patient much sooner if necessary.

## 2018-05-02 DIAGNOSIS — D23111 Other benign neoplasm of skin of right upper eyelid, including canthus: Secondary | ICD-10-CM | POA: Diagnosis not present

## 2018-05-18 ENCOUNTER — Inpatient Hospital Stay: Payer: Medicare Other

## 2018-05-18 ENCOUNTER — Encounter: Payer: Self-pay | Admitting: Oncology

## 2018-05-18 ENCOUNTER — Inpatient Hospital Stay: Payer: Medicare Other | Attending: Internal Medicine

## 2018-05-18 ENCOUNTER — Inpatient Hospital Stay (HOSPITAL_BASED_OUTPATIENT_CLINIC_OR_DEPARTMENT_OTHER): Payer: Medicare Other | Admitting: Oncology

## 2018-05-18 ENCOUNTER — Telehealth: Payer: Self-pay | Admitting: Oncology

## 2018-05-18 VITALS — BP 137/81 | HR 94 | Temp 98.6°F | Resp 16 | Ht 69.0 in | Wt 147.9 lb

## 2018-05-18 DIAGNOSIS — Z95828 Presence of other vascular implants and grafts: Secondary | ICD-10-CM

## 2018-05-18 DIAGNOSIS — C3491 Malignant neoplasm of unspecified part of right bronchus or lung: Secondary | ICD-10-CM

## 2018-05-18 DIAGNOSIS — Z5111 Encounter for antineoplastic chemotherapy: Secondary | ICD-10-CM

## 2018-05-18 DIAGNOSIS — R52 Pain, unspecified: Secondary | ICD-10-CM

## 2018-05-18 DIAGNOSIS — C3431 Malignant neoplasm of lower lobe, right bronchus or lung: Secondary | ICD-10-CM

## 2018-05-18 DIAGNOSIS — C7951 Secondary malignant neoplasm of bone: Secondary | ICD-10-CM | POA: Diagnosis not present

## 2018-05-18 DIAGNOSIS — Z79899 Other long term (current) drug therapy: Secondary | ICD-10-CM | POA: Diagnosis not present

## 2018-05-18 LAB — CBC WITH DIFFERENTIAL (CANCER CENTER ONLY)
Abs Immature Granulocytes: 0.02 10*3/uL (ref 0.00–0.07)
Basophils Absolute: 0 10*3/uL (ref 0.0–0.1)
Basophils Relative: 0 %
Eosinophils Absolute: 0 10*3/uL (ref 0.0–0.5)
Eosinophils Relative: 0 %
HCT: 31.4 % — ABNORMAL LOW (ref 36.0–46.0)
Hemoglobin: 10.4 g/dL — ABNORMAL LOW (ref 12.0–15.0)
Immature Granulocytes: 0 %
Lymphocytes Relative: 14 %
Lymphs Abs: 0.8 10*3/uL (ref 0.7–4.0)
MCH: 33.8 pg (ref 26.0–34.0)
MCHC: 33.1 g/dL (ref 30.0–36.0)
MCV: 101.9 fL — ABNORMAL HIGH (ref 80.0–100.0)
Monocytes Absolute: 0.5 10*3/uL (ref 0.1–1.0)
Monocytes Relative: 10 %
Neutro Abs: 4.2 10*3/uL (ref 1.7–7.7)
Neutrophils Relative %: 76 %
Platelet Count: 287 10*3/uL (ref 150–400)
RBC: 3.08 MIL/uL — ABNORMAL LOW (ref 3.87–5.11)
RDW: 14.4 % (ref 11.5–15.5)
WBC Count: 5.6 10*3/uL (ref 4.0–10.5)
nRBC: 0 % (ref 0.0–0.2)

## 2018-05-18 LAB — CMP (CANCER CENTER ONLY)
ALT: 14 U/L (ref 0–44)
AST: 31 U/L (ref 15–41)
Albumin: 3.5 g/dL (ref 3.5–5.0)
Alkaline Phosphatase: 57 U/L (ref 38–126)
Anion gap: 11 (ref 5–15)
BUN: 21 mg/dL (ref 8–23)
CO2: 25 mmol/L (ref 22–32)
Calcium: 9.7 mg/dL (ref 8.9–10.3)
Chloride: 106 mmol/L (ref 98–111)
Creatinine: 1.29 mg/dL — ABNORMAL HIGH (ref 0.44–1.00)
GFR, Est AFR Am: 49 mL/min — ABNORMAL LOW (ref >60)
GFR, Estimated: 42 mL/min — ABNORMAL LOW (ref >60)
Glucose, Bld: 123 mg/dL — ABNORMAL HIGH (ref 70–99)
Potassium: 3.7 mmol/L (ref 3.5–5.1)
Sodium: 142 mmol/L (ref 135–145)
Total Bilirubin: 0.7 mg/dL (ref 0.3–1.2)
Total Protein: 7.2 g/dL (ref 6.5–8.1)

## 2018-05-18 MED ORDER — PROCHLORPERAZINE MALEATE 10 MG PO TABS
ORAL_TABLET | ORAL | Status: AC
  Filled 2018-05-18: qty 1

## 2018-05-18 MED ORDER — SODIUM CHLORIDE 0.9 % IV SOLN
1000.0000 mg | Freq: Once | INTRAVENOUS | Status: AC
  Administered 2018-05-18: 1000 mg via INTRAVENOUS
  Filled 2018-05-18: qty 40

## 2018-05-18 MED ORDER — PROCHLORPERAZINE MALEATE 10 MG PO TABS
10.0000 mg | ORAL_TABLET | Freq: Once | ORAL | Status: AC
  Administered 2018-05-18: 10 mg via ORAL

## 2018-05-18 MED ORDER — SODIUM CHLORIDE 0.9% FLUSH
10.0000 mL | Freq: Once | INTRAVENOUS | Status: AC
  Administered 2018-05-18: 10 mL
  Filled 2018-05-18: qty 10

## 2018-05-18 MED ORDER — SODIUM CHLORIDE 0.9% FLUSH
10.0000 mL | INTRAVENOUS | Status: DC | PRN
  Administered 2018-05-18: 10 mL
  Filled 2018-05-18: qty 10

## 2018-05-18 MED ORDER — SODIUM CHLORIDE 0.9 % IV SOLN
Freq: Once | INTRAVENOUS | Status: AC
  Administered 2018-05-18: 12:00:00 via INTRAVENOUS
  Filled 2018-05-18: qty 250

## 2018-05-18 MED ORDER — HEPARIN SOD (PORK) LOCK FLUSH 100 UNIT/ML IV SOLN
500.0000 [IU] | Freq: Once | INTRAVENOUS | Status: AC | PRN
  Administered 2018-05-18: 500 [IU]
  Filled 2018-05-18: qty 5

## 2018-05-18 NOTE — Telephone Encounter (Signed)
Scheduled appt per 12/12 los - pt to get an updated schedule next visit.

## 2018-05-18 NOTE — Assessment & Plan Note (Addendum)
This is a very pleasant 72 year old Hispanic female with metastatic non-small cell lung cancer, adenocarcinoma status post induction systemic chemotherapy with carboplatin and Alimta and she is currently on maintenance treatment with single agent Alimta status post37cycles. The patient continues to tolerate this treatment well with no concerning adverse effects.  Recommend for her to proceed with cycle #38 of her treatment today as scheduled.    She will have a restaging CT scan of the chest, abdomen, pelvis prior to her next visit.  She will follow-up in 3 weeks for evaluation prior to cycle #39 and to review her restaging CT scan results.  She was advised to call immediately if she has any concerning symptoms in the interval. The patient voices understanding of current disease status and treatment options and is in agreement with the current care plan. All questions were answered. The patient knows to call the clinic with any problems, questions or concerns. We can certainly see the patient much sooner if necessary.

## 2018-05-18 NOTE — Progress Notes (Signed)
[place] OFFICE PROGRESS NOTE  Colon Branch, MD [address] [place] [address]  DIAGNOSIS:Stage IV (T1b, N2, M1b) non-small cell lung cancer, adenocarcinoma diagnosed in March 2017 and presented with right lower lobe lung nodule in addition to mediastinal lymphadenopathy and metastatic bone lesions.  Molecular studies:PDL 1 TPS <1%.   [facility] Studies: Positive for ERBB2 [id number]. Negative for EGFR, KRAS, ALK, BRAF, MET, RET and ROS1.  PRIOR THERAPY:  1) Induction systemic chemotherapy with carboplatin for AUC of 5 and Alimta 500 MG/M2 every 3 weeks is status post 6 cycles at the [facility], last dose was given 03/05/2016 with stable disease. 2) palliative radiation to the metastatic bone disease in the lower back and pelvic area.  CURRENT THERAPY::  1) Maintenance systemic chemotherapy with Alimta 500 MG/M2 every 3 weeks status post 37cycles. First dose was given 03/25/2016.  2) Zometa 4 mg IV every 12 week for metastatic bone disease.  INTERVAL HISTORY: Valerie Long 72 y.o. female returns for routine follow-up visit accompanied by her sister.  The patient is feeling fine today and has no specific complaints that for ongoing pain which is unchanged.  She denies fevers and chills.  Denies chest pain, shortness of breath, cough, hemoptysis.  Denies nausea, vomiting, constipation, diarrhea.  Denies recent weight loss or night sweats.  The patient is here for evaluation prior to cycle #38 of her treatment.  MEDICAL HISTORY: Past Medical History:  Diagnosis Date  . Adenocarcinoma of right lung, stage 4 ([place] [facility]) 2017  . Anemia   . Arthritis   . Bone metastases ([place]) 02/24/2017  . Encounter for antineoplastic chemotherapy 03/17/2016  . Hypercholesterolemia   . Hypertension 06/18/2016  . Osteopenia   . 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 Pneumonia   . Shortness of breath dyspnea     ALLERGIES:  has  No Known Allergies.  MEDICATIONS:  Current Outpatient Medications  Medication Sig Dispense Refill  . acetaminophen (TYLENOL) 500 MG tablet Take 1,000 mg by mouth every 6 (six) hours as needed for moderate pain or headache.    . baclofen (LIORESAL) 10 MG tablet Take 1 tablet (10 mg total) by mouth 3 (three) times daily. 30 each 0  . benzonatate (TESSALON) 100 MG capsule Take 1 capsule (100 mg total) by mouth 3 (three) times daily as needed for cough. 40 capsule 1  . Calcium Carb-Cholecalciferol (CALCIUM 600/VITAMIN D3 PO) Take by mouth.    . Cyanocobalamin (B-12 PO) Take 2 tablets by mouth daily.    Marland Kitchen dexamethasone (DECADRON) 4 MG tablet Take 89m by mouth twice daily the day before, of, and after chemo 40 tablet 1  . Fish Oil-Cholecalciferol (OMEGA-3 + VITAMIN D3 PO) Take 1 tablet by mouth daily.    . folic acid (FOLVITE) 1 MG tablet Take 1 tablet (1 mg total) by mouth daily. 90 tablet 1  . ketorolac (ACULAR) 0.5 % ophthalmic solution Place 1 drop into both eyes 4 (four) times daily.  0  . lidocaine-prilocaine (EMLA) cream Apply 1 application topically as needed. Apply 1-2 tsp over port site 1-2 hours prior to chemo. 30 g 0  . meloxicam (MOBIC) 15 MG tablet TAKE 1 TABLET BY MOUTH EVERY DAY AS NEEDED FOR PAIN 30 tablet 0  . neomycin-polymyxin b-dexamethasone (MAXITROL) 3.5-10000-0.1 SUSP INSTILL 1 DROP BY OPHTHALMIC ROUTE 4 TIMES PER DAY INTO BOTH EYES.  1  . ofloxacin (OCUFLOX) 0.3 % ophthalmic solution  Place 1 drop into both eyes 4 (four) times daily.  0  . ondansetron (ZOFRAN) 8 MG tablet Take 8 mg by mouth every 8 (eight) hours as needed.    Marland Kitchen OVER THE COUNTER MEDICATION Apply 1 drop topically daily as needed. 1 drop CBD oil in vicks vapor rub . Applied to right thigh for pain relief.    Marland Kitchen oxyCODONE-acetaminophen (PERCOCET/ROXICET) 5-325 MG tablet Take 1 tablet by mouth every 8 (eight) hours as needed for severe pain. 30 tablet 0  . prednisoLONE acetate (PRED FORTE) 1 % ophthalmic suspension  Place 1 drop into both eyes 4 (four) times daily.  0  . PRESCRIPTION MEDICATION Inject 1 Dose as directed every 21 ( twenty-one) days. Chemo    . prochlorperazine (COMPAZINE) 10 MG tablet Take 1 tablet (10 mg total) by mouth every 6 (six) hours as needed for nausea or vomiting. 30 tablet 0   No current facility-administered medications for this visit.    Facility-Administered Medications Ordered in Other Visits  Medication Dose Route Frequency Provider Last Rate Last Dose  . sodium chloride flush (NS) 0.9 % injection 10 mL  10 mL Intracatheter PRN Curt Bears, MD   10 mL at 05/18/18 1259    SURGICAL HISTORY:  Past Surgical History:  Procedure Laterality Date  . CESAREAN SECTION     myomectomy  . COLONOSCOPY W/ POLYPECTOMY    . IR GENERIC HISTORICAL  08/17/2016   IR US GUIDE VASC ACCESS RIGHT 08/17/2016 [name], MD [facility]  . IR GENERIC HISTORICAL  08/17/2016   IR FLUORO GUIDE PORT INSERTION RIGHT 08/17/2016 [name], MD [facility]  . MEDIASTINOSCOPY N/A 09/25/2015   Procedure: MEDIASTINOSCOPY;  Surgeon: Melrose Nakayama, MD;  Location: [facility];  Service: Thoracic;  Laterality: N/A;  . VIDEO BRONCHOSCOPY Bilateral 08/19/2015   Procedure: VIDEO BRONCHOSCOPY WITH FLUORO;  Surgeon: Rigoberto Noel, MD;  Location: [facility];  Service: Cardiopulmonary;  Laterality: Bilateral;  . VIDEO BRONCHOSCOPY N/A 09/08/2015   Procedure: VIDEO BRONCHOSCOPY WITH FLUORO;  Surgeon: Rigoberto Noel, MD;  Location: [facility];  Service: Thoracic;  Laterality: N/A;  . VIDEO BRONCHOSCOPY WITH ENDOBRONCHIAL ULTRASOUND Right 09/08/2015   Procedure: ATTEMPTED VIDEO BRONCHOSCOPY ENDOBRONCHIAL ULTRASOUND  ;  Surgeon: Rigoberto Noel, MD;  Location: [facility];  Service: Thoracic;  Laterality: Right;    REVIEW OF SYSTEMS:   Review of Systems  Constitutional: Negative for appetite change, chills, fatigue, fever and unexpected weight change.  HENT:   Negative for mouth sores, nosebleeds, sore throat and  trouble swallowing.   Eyes: Negative for eye problems and icterus.  Respiratory: Negative for cough, hemoptysis, shortness of breath and wheezing.   Cardiovascular: Negative for chest pain and leg swelling.  Gastrointestinal: Negative for abdominal pain, constipation, diarrhea, nausea and vomiting.  Genitourinary: Negative for bladder incontinence, difficulty urinating, dysuria, frequency and hematuria.   Musculoskeletal: Positive for back and left leg pain which is unchanged from previous reports. Skin: Negative for itching and rash.  Neurological: Negative for dizziness, extremity weakness, gait problem, headaches, light-headedness and seizures.  Hematological: Negative for adenopathy. Does not bruise/bleed easily.  Psychiatric/Behavioral: Negative for confusion, depression and sleep disturbance. The patient is not nervous/anxious.     PHYSICAL EXAMINATION:  Blood pressure 137/81, pulse 94, temperature 98.6 F (37 C), temperature source Oral, resp. rate 16, height _0  (1.753 m), weight 147 lb 14.4 oz (67.1 kg), last menstrual period 06/09/1998, SpO2 100 %.  ECOG PERFORMANCE STATUS: 1 - Symptomatic but completely ambulatory  Physical Exam  Constitutional: Oriented to person, place, and time and well-developed, well-nourished, and in no distress. No distress.  HENT:  Head: Normocephalic and atraumatic.  Mouth/Throat: Oropharynx is clear and moist. No oropharyngeal exudate.  Eyes: Conjunctivae are normal. Right eye exhibits no discharge. Left eye exhibits no discharge. No scleral icterus.  Neck: Normal range of motion. Neck supple.  Cardiovascular: Normal rate, regular rhythm, normal heart sounds and intact distal pulses.   Pulmonary/Chest: Effort normal and breath sounds normal. No respiratory distress. No wheezes. No rales.  Abdominal: Soft. Bowel sounds are normal. Exhibits no distension and no mass. There is no tenderness.  Musculoskeletal: Normal range of motion. Exhibits no edema.   Lymphadenopathy:    No cervical adenopathy.  Neurological: Alert and oriented to person, place, and time. Exhibits normal muscle tone. Gait normal. Coordination normal.  Skin: Skin is warm and dry. No rash noted. Not diaphoretic. No erythema. No pallor.  Psychiatric: Mood, memory and judgment normal.  Vitals reviewed.  LABORATORY DATA: Lab Results  Component Value Date   WBC 5.6 05/18/2018   HGB 10.4 (L) 05/18/2018   HCT 31.4 (L) 05/18/2018   MCV 101.9 (H) 05/18/2018   PLT 287 05/18/2018      Chemistry      Component Value Date/Time   NA 142 05/18/2018 0958   NA 137 06/09/2017 0901   K 3.7 05/18/2018 0958   K 3.9 06/09/2017 0901   CL 106 05/18/2018 0958   CO2 25 05/18/2018 0958   CO2 25 06/09/2017 0901   BUN 21 05/18/2018 0958   BUN 19.0 06/09/2017 0901   CREATININE 1.29 (H) 05/18/2018 0958   CREATININE 1.2 (H) 06/09/2017 0901   GLU 170 01/23/2016      Component Value Date/Time   CALCIUM 9.7 05/18/2018 0958   CALCIUM 9.9 06/09/2017 0901   ALKPHOS 57 05/18/2018 0958   ALKPHOS 63 06/09/2017 0901   AST 31 05/18/2018 0958   AST 35 (H) 06/09/2017 0901   ALT 14 05/18/2018 0958   ALT 23 06/09/2017 0901   BILITOT 0.7 05/18/2018 0958   BILITOT 0.65 06/09/2017 0901       RADIOGRAPHIC STUDIES:  No results found.   ASSESSMENT/PLAN:  Adenocarcinoma of right lung, stage 4 ([facility]) This is a very pleasant 72 year old Hispanic female with metastatic non-small cell lung cancer, adenocarcinoma status post induction systemic chemotherapy with carboplatin and Alimta and she is currently on maintenance treatment with single agent Alimta status post37cycles. The patient continues to tolerate this treatment well with no concerning adverse effects.  Recommend for her to proceed with cycle #38 of her treatment today as scheduled.    She will have a restaging CT scan of the chest, abdomen, pelvis prior to her next visit.  She will follow-up in 3 weeks for evaluation prior to cycle #39  and to review her restaging CT scan results.  She was advised to call immediately if she has any concerning symptoms in the interval. The patient voices understanding of current disease status and treatment options and is in agreement with the current care plan. All questions were answered. The patient knows to call the clinic with any problems, questions or concerns. We can certainly see the patient much sooner if necessary.   Orders Placed This Encounter  Procedures  . CT ABDOMEN PELVIS W CONTRAST    Standing Status:   Future    Standing Expiration Date:   05/19/2019    Order Specific Question:   If indicated  for the ordered procedure, I authorize the administration of contrast media per Radiology protocol    Answer:   Yes    Order Specific Question:   Preferred imaging location?    Answer:   [facility]    Order Specific Question:   Radiology Contrast Protocol - do NOT remove file path    Answer:   [email or web address]    Order Specific Question:   ** REASON FOR EXAM (FREE TEXT)    Answer:   Lung cancer. Restaging.  . CT CHEST W CONTRAST    Standing Status:   Future    Standing Expiration Date:   05/19/2019    Order Specific Question:   If indicated for the ordered procedure, I authorize the administration of contrast media per Radiology protocol    Answer:   Yes    Order Specific Question:   Preferred imaging location?    Answer:   [facility]    Order Specific Question:   Radiology Contrast Protocol - do NOT remove file path    Answer:   \\[email or web address]    Order Specific Question:   ** REASON FOR EXAM (FREE TEXT)    Answer:   Lung cancer. Restaging.     Mikey Bussing, DNP, AGPCNP-BC, AOCNP 05/18/18

## 2018-05-18 NOTE — Patient Instructions (Signed)
[facility] Discharge Instructions for Patients Receiving Chemotherapy  Today you received the following chemotherapy agents Pemetrexed (ALIMTA).  To help prevent nausea and vomiting after your treatment, we encourage you to take your nausea medication as prescribed.   If you develop nausea and vomiting that is not controlled by your nausea medication, call the clinic.   BELOW ARE SYMPTOMS THAT SHOULD BE REPORTED IMMEDIATELY:  *FEVER GREATER THAN 100.5 F  *CHILLS WITH OR WITHOUT FEVER  NAUSEA AND VOMITING THAT IS NOT CONTROLLED WITH YOUR NAUSEA MEDICATION  *UNUSUAL SHORTNESS OF BREATH  *UNUSUAL BRUISING OR BLEEDING  TENDERNESS IN MOUTH AND THROAT WITH OR WITHOUT PRESENCE OF ULCERS  *URINARY PROBLEMS  *BOWEL PROBLEMS  UNUSUAL RASH Items with * indicate a potential emergency and should be followed up as soon as possible.  Feel free to call the clinic should you have any questions or concerns. The clinic phone number is [phone].  Please show the [facility] at check-in to the Emergency Department and triage nurse.

## 2018-06-05 ENCOUNTER — Ambulatory Visit (HOSPITAL_COMMUNITY)
Admission: RE | Admit: 2018-06-05 | Discharge: 2018-06-05 | Disposition: A | Discharge status: Home / Self Care | Payer: Medicare Other | Source: Ambulatory Visit | Attending: Oncology | Admitting: Oncology

## 2018-06-05 DIAGNOSIS — C3491 Malignant neoplasm of unspecified part of right bronchus or lung: Secondary | ICD-10-CM | POA: Diagnosis not present

## 2018-06-05 DIAGNOSIS — C7951 Secondary malignant neoplasm of bone: Secondary | ICD-10-CM | POA: Diagnosis not present

## 2018-06-05 DIAGNOSIS — Z85118 Personal history of other malignant neoplasm of bronchus and lung: Secondary | ICD-10-CM | POA: Diagnosis not present

## 2018-06-05 MED ORDER — SODIUM CHLORIDE (PF) 0.9 % IJ SOLN
INTRAMUSCULAR | Status: AC
  Filled 2018-06-05: qty 50

## 2018-06-05 MED ORDER — IOHEXOL 300 MG/ML  SOLN
100.0000 mL | Freq: Once | INTRAMUSCULAR | Status: AC | PRN
  Administered 2018-06-05: 80 mL via INTRAVENOUS

## 2018-06-08 ENCOUNTER — Encounter: Payer: Self-pay | Admitting: Internal Medicine

## 2018-06-08 ENCOUNTER — Inpatient Hospital Stay: Payer: Medicare Other

## 2018-06-08 ENCOUNTER — Other Ambulatory Visit: Payer: Self-pay | Admitting: Internal Medicine

## 2018-06-08 ENCOUNTER — Inpatient Hospital Stay: Payer: Medicare Other | Attending: Internal Medicine | Admitting: Internal Medicine

## 2018-06-08 VITALS — BP 155/79 | HR 80 | Temp 97.7°F | Resp 18 | Ht 69.0 in | Wt 152.2 lb

## 2018-06-08 DIAGNOSIS — D6481 Anemia due to antineoplastic chemotherapy: Secondary | ICD-10-CM | POA: Diagnosis not present

## 2018-06-08 DIAGNOSIS — T451X5A Adverse effect of antineoplastic and immunosuppressive drugs, initial encounter: Secondary | ICD-10-CM

## 2018-06-08 DIAGNOSIS — C3491 Malignant neoplasm of unspecified part of right bronchus or lung: Secondary | ICD-10-CM

## 2018-06-08 DIAGNOSIS — Z5111 Encounter for antineoplastic chemotherapy: Secondary | ICD-10-CM | POA: Diagnosis not present

## 2018-06-08 DIAGNOSIS — C7951 Secondary malignant neoplasm of bone: Secondary | ICD-10-CM | POA: Insufficient documentation

## 2018-06-08 DIAGNOSIS — C3431 Malignant neoplasm of lower lobe, right bronchus or lung: Secondary | ICD-10-CM | POA: Diagnosis not present

## 2018-06-08 DIAGNOSIS — M545 Low back pain: Secondary | ICD-10-CM | POA: Diagnosis not present

## 2018-06-08 DIAGNOSIS — Z95828 Presence of other vascular implants and grafts: Secondary | ICD-10-CM

## 2018-06-08 DIAGNOSIS — I10 Essential (primary) hypertension: Secondary | ICD-10-CM

## 2018-06-08 LAB — CBC WITH DIFFERENTIAL (CANCER CENTER ONLY)
Abs Immature Granulocytes: 0.02 10*3/uL (ref 0.00–0.07)
Basophils Absolute: 0 10*3/uL (ref 0.0–0.1)
Basophils Relative: 0 %
Eosinophils Absolute: 0 10*3/uL (ref 0.0–0.5)
Eosinophils Relative: 0 %
HCT: 29 % — ABNORMAL LOW (ref 36.0–46.0)
Hemoglobin: 9.6 g/dL — ABNORMAL LOW (ref 12.0–15.0)
Immature Granulocytes: 0 %
Lymphocytes Relative: 10 %
Lymphs Abs: 0.5 10*3/uL — ABNORMAL LOW (ref 0.7–4.0)
MCH: 33.9 pg (ref 26.0–34.0)
MCHC: 33.1 g/dL (ref 30.0–36.0)
MCV: 102.5 fL — ABNORMAL HIGH (ref 80.0–100.0)
Monocytes Absolute: 0.5 10*3/uL (ref 0.1–1.0)
Monocytes Relative: 9 %
Neutro Abs: 4.2 10*3/uL (ref 1.7–7.7)
Neutrophils Relative %: 81 %
Platelet Count: 255 10*3/uL (ref 150–400)
RBC: 2.83 MIL/uL — ABNORMAL LOW (ref 3.87–5.11)
RDW: 14.4 % (ref 11.5–15.5)
WBC Count: 5.2 10*3/uL (ref 4.0–10.5)
nRBC: 0 % (ref 0.0–0.2)

## 2018-06-08 LAB — CMP (CANCER CENTER ONLY)
ALT: 15 U/L (ref 0–44)
AST: 28 U/L (ref 15–41)
Albumin: 3.2 g/dL — ABNORMAL LOW (ref 3.5–5.0)
Alkaline Phosphatase: 62 U/L (ref 38–126)
Anion gap: 9 (ref 5–15)
BUN: 23 mg/dL (ref 8–23)
CO2: 24 mmol/L (ref 22–32)
Calcium: 9.8 mg/dL (ref 8.9–10.3)
Chloride: 107 mmol/L (ref 98–111)
Creatinine: 1.18 mg/dL — ABNORMAL HIGH (ref 0.44–1.00)
GFR, Est AFR Am: 54 mL/min — ABNORMAL LOW (ref >60)
GFR, Estimated: 47 mL/min — ABNORMAL LOW (ref >60)
Glucose, Bld: 141 mg/dL — ABNORMAL HIGH (ref 70–99)
Potassium: 3.8 mmol/L (ref 3.5–5.1)
Sodium: 140 mmol/L (ref 135–145)
Total Bilirubin: 0.5 mg/dL (ref 0.3–1.2)
Total Protein: 6.6 g/dL (ref 6.5–8.1)

## 2018-06-08 MED ORDER — SODIUM CHLORIDE 0.9 % IV SOLN
Freq: Once | INTRAVENOUS | Status: AC
  Administered 2018-06-08: 11:00:00 via INTRAVENOUS
  Filled 2018-06-08: qty 250

## 2018-06-08 MED ORDER — PROCHLORPERAZINE MALEATE 10 MG PO TABS
ORAL_TABLET | ORAL | Status: AC
  Filled 2018-06-08: qty 1

## 2018-06-08 MED ORDER — SODIUM CHLORIDE 0.9% FLUSH
10.0000 mL | INTRAVENOUS | Status: DC | PRN
  Administered 2018-06-08: 10 mL
  Filled 2018-06-08: qty 10

## 2018-06-08 MED ORDER — SODIUM CHLORIDE 0.9 % IV SOLN: 500.0000 mg/m2 | Freq: Once | INTRAVENOUS | Status: DC

## 2018-06-08 MED ORDER — SODIUM CHLORIDE 0.9% FLUSH
10.0000 mL | Freq: Once | INTRAVENOUS | Status: AC
  Administered 2018-06-08: 10 mL
  Filled 2018-06-08: qty 10

## 2018-06-08 MED ORDER — ZOLEDRONIC ACID 4 MG/100ML IV SOLN: 4.0000 mg | Freq: Once | INTRAVENOUS | Status: DC

## 2018-06-08 MED ORDER — ZOLEDRONIC ACID 4 MG/100ML IV SOLN
4.0000 mg | Freq: Once | INTRAVENOUS | Status: AC
  Administered 2018-06-08: 4 mg via INTRAVENOUS
  Filled 2018-06-08: qty 100

## 2018-06-08 MED ORDER — SODIUM CHLORIDE 0.9 % IV SOLN
525.0000 mg/m2 | Freq: Once | INTRAVENOUS | Status: AC
  Administered 2018-06-08: 1000 mg via INTRAVENOUS
  Filled 2018-06-08: qty 40

## 2018-06-08 MED ORDER — PROCHLORPERAZINE MALEATE 10 MG PO TABS
10.0000 mg | ORAL_TABLET | Freq: Once | ORAL | Status: AC
  Administered 2018-06-08: 10 mg via ORAL

## 2018-06-08 MED ORDER — HEPARIN SOD (PORK) LOCK FLUSH 100 UNIT/ML IV SOLN
500.0000 [IU] | Freq: Once | INTRAVENOUS | Status: AC | PRN
  Administered 2018-06-08: 500 [IU]
  Filled 2018-06-08: qty 5

## 2018-06-08 NOTE — Progress Notes (Signed)
[place] [phone]   Fax:([phone]) [place], [place] [address] [place] [address]  DIAGNOSIS: Stage IV (T1b, N2, M1b) non-small cell lung cancer, adenocarcinoma diagnosed in March 2017 and presented with right lower lobe lung nodule in addition to mediastinal lymphadenopathy and metastatic bone lesions.  Molecular studies: PDL 1 TPS  <1%.   Foundation One Studies: Positive for ERBB2 [id number]. Negative for EGFR, KRAS, ALK, BRAF, MET, RET and ROS1.  PRIOR THERAPY:  1) Induction systemic chemotherapy with carboplatin for AUC of 5 and Alimta 500 MG/M2 every 3 weeks is status post 6 cycles at the [facility], last dose was given 03/05/2016 with stable disease. 2) palliative radiation to the metastatic bone disease in the lower back and pelvic area.  CURRENT THERAPY::  1)  Maintenance systemic chemotherapy with Alimta 500 MG/M2 every 3 weeks status post 38 cycles. First dose was given 03/25/2016.  2) Zometa 4 mg IV every 12 week for metastatic bone disease.  INTERVAL HIST Valerie Long 73 y.o. female returns to the clinic today for follow-up visit accompanied by her sister.  The patient is feeling fine today with no concerning complaints except for the low back pain with radiation to the lower extremities.  She denied having any chest pain, shortness of breath, cough or hemoptysis.  She denied having any weight loss or night sweats.  She has no nausea, vomiting, diarrhea or constipation.  She continues to tolerate her treatment with Alimta fairly well.  The patient had repeat CT scan of the chest, abdomen and pelvis performed recently and she is here for evaluation and discussion of her scan results.  MEDICAL HISTORY: Past Medical History:  Diagnosis Date  . Adenocarcinoma of right lung, stage 4 ([place]) 2017  . Anemia   . Arthritis   . Bone metastases ([place]) 02/24/2017  . Encounter for  antineoplastic chemotherapy 03/17/2016  . Hypercholesterolemia   . Hypertension 06/18/2016  . Osteopenia   . 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 Pneumonia   . Shortness of breath dyspnea     ALLERGIES:  has No Known Allergies.  MEDICATIONS:  Current Outpatient Medications  Medication Sig Dispense Refill  . acetaminophen (TYLENOL) 500 MG tablet Take 1,000 mg by mouth every 6 (six) hours as needed for moderate pain or headache.    . baclofen (LIORESAL) 10 MG tablet Take 1 tablet (10 mg total) by mouth 3 (three) times daily. 30 each 0  . Calcium Carb-Cholecalciferol (CALCIUM 600/VITAMIN D3 PO) Take by mouth.    . Cyanocobalamin (B-12 PO) Take 2 tablets by mouth daily.    Marland Kitchen dexamethasone (DECADRON) 4 MG tablet Take '4mg'$  by mouth twice daily the day before, of, and after chemo 40 tablet 1  . Fish Oil-Cholecalciferol (OMEGA-3 + VITAMIN D3 PO) Take 1 tablet by mouth daily.    . folic acid (FOLVITE) 1 MG tablet Take 1 tablet (1 mg total) by mouth daily. 90 tablet 1  . ketorolac (ACULAR) 0.5 % ophthalmic solution Place 1 drop into both eyes 4 (four) times daily.  0  . lidocaine-prilocaine (EMLA) cream Apply 1 application topically as needed. Apply 1-2 tsp over port site 1-2 hours prior to chemo. 30 g 0  . meloxicam (MOBIC) 15 MG tablet TAKE 1 TABLET BY MOUTH EVERY DAY AS NEEDED FOR PAIN 30 tablet 0  . neomycin-polymyxin b-dexamethasone (MAXITROL) 3.5-10000-0.1  SUSP INSTILL 1 DROP BY OPHTHALMIC ROUTE 4 TIMES PER DAY INTO BOTH EYES.  1  . ofloxacin (OCUFLOX) 0.3 % ophthalmic solution Place 1 drop into both eyes 4 (four) times daily.  0  . OVER THE COUNTER MEDICATION Apply 1 drop topically daily as needed. 1 drop CBD oil in vicks vapor rub . Applied to right thigh for pain relief.    . prednisoLONE acetate (PRED FORTE) 1 % ophthalmic suspension Place 1 drop into both eyes 4 (four) times daily.  0  . PRESCRIPTION MEDICATION Inject 1 Dose as directed every 21 ( twenty-one) days. Chemo     . benzonatate (TESSALON) 100 MG capsule Take 1 capsule (100 mg total) by mouth 3 (three) times daily as needed for cough. (Patient not taking: Reported on 06/08/2018) 40 capsule 1  . ondansetron (ZOFRAN) 8 MG tablet Take 8 mg by mouth every 8 (eight) hours as needed.    Marland Kitchen oxyCODONE-acetaminophen (PERCOCET/ROXICET) 5-325 MG tablet Take 1 tablet by mouth every 8 (eight) hours as needed for severe pain. (Patient not taking: Reported on 06/08/2018) 30 tablet 0  . prochlorperazine (COMPAZINE) 10 MG tablet Take 1 tablet (10 mg total) by mouth every 6 (six) hours as needed for nausea or vomiting. (Patient not taking: Reported on 06/08/2018) 30 tablet 0   No current facility-administered medications for this visit.     SURGICAL HISTORY:  Past Surgical History:  Procedure Laterality Date  . CESAREAN SECTION     myomectomy  . COLONOSCOPY W/ POLYPECTOMY    . IR GENERIC HISTORICAL  08/17/2016   IR US GUIDE VASC ACCESS RIGHT 08/17/2016 [name], MD [facility]INTER[facility]  . IR GENERIC HISTORICAL  08/17/2016   IR FLUORO GUIDE PORT INSERTION RIGHT 08/17/2016 [name], MD [facility]  . MEDIASTINOSCOPY N/A 09/25/2015   Procedure: MEDIASTINOSCOPY;  Surgeon: Melrose Nakayama, MD;  Location: [facility];  Service: Thoracic;  Laterality: N/A;  . VIDEO BRONCHOSCOPY Bilateral 08/19/2015   Procedure: VIDEO BRONCHOSCOPY WITH FLUORO;  Surgeon: Rigoberto Noel, MD;  Location: [facility];  Service: Cardiopulmonary;  Laterality: Bilateral;  . VIDEO BRONCHOSCOPY N/A 09/08/2015   Procedure: VIDEO BRONCHOSCOPY WITH FLUORO;  Surgeon: Rigoberto Noel, MD;  Location: [facility];  Service: Thoracic;  Laterality: N/A;  . VIDEO BRONCHOSCOPY WITH ENDOBRONCHIAL ULTRASOUND Right 09/08/2015   Procedure: ATTEMPTED VIDEO BRONCHOSCOPY ENDOBRONCHIAL ULTRASOUND  ;  Surgeon: Rigoberto Noel, MD;  Location: [facility];  Service: Thoracic;  Laterality: Right;    REVIEW OF SYSTEMS:  Constitutional: positive for fatigue Eyes: negative Ears, nose, mouth,  throat, and face: negative Respiratory: negative Cardiovascular: negative Gastrointestinal: negative Genitourinary:negative Integument/breast: negative Hematologic/lymphatic: negative Musculoskeletal:positive for arthralgias and back pain Neurological: negative Behavioral/Psych: negative Endocrine: negative Allergic/Immunologic: negative   PHYSICAL EXAMINATION: General appearance: alert, cooperative, fatigued and no distress Head: Normocephalic, without obvious abnormality, atraumatic Neck: no adenopathy, no JVD, supple, symmetrical, trachea midline and thyroid not enlarged, symmetric, no tenderness/mass/nodules Lymph nodes: Cervical, supraclavicular, and axillary nodes normal. Resp: clear to auscultation bilaterally Back: symmetric, no curvature. ROM normal. No CVA tenderness. Cardio: regular rate and rhythm, S1, S2 normal, no murmur, click, rub or gallop GI: soft, non-tender; bowel sounds normal; no masses,  no organomegaly Extremities: extremities normal, atraumatic, no cyanosis or edema Neurologic: Alert and oriented X 3, normal strength and tone. Normal symmetric reflexes. Normal coordination and gait  ECOG PERFORMANCE STATUS: 1 - Symptomatic but completely ambulatory  Blood pressure (!) 155/79, pulse 80, temperature 97.7 F (36.5 C), temperature source Oral, resp.  rate 18, height '5\' 9"'$  (1.753 m), weight 152 lb 3.2 oz (69 kg), last menstrual period 06/09/1998, SpO2 100 %.  LABORATORY DATA: Lab Results  Component Value Date   WBC 5.2 06/08/2018   HGB 9.6 (L) 06/08/2018   HCT 29.0 (L) 06/08/2018   MCV 102.5 (H) 06/08/2018   PLT 255 06/08/2018      Chemistry      Component Value Date/Time   NA 142 05/18/2018 0958   NA 137 06/09/2017 0901   K 3.7 05/18/2018 0958   K 3.9 06/09/2017 0901   CL 106 05/18/2018 0958   CO2 25 05/18/2018 0958   CO2 25 06/09/2017 0901   BUN 21 05/18/2018 0958   BUN 19.0 06/09/2017 0901   CREATININE 1.29 (H) 05/18/2018 0958   CREATININE 1.2  (H) 06/09/2017 0901   GLU 170 01/23/2016      Component Value Date/Time   CALCIUM 9.7 05/18/2018 0958   CALCIUM 9.9 06/09/2017 0901   ALKPHOS 57 05/18/2018 0958   ALKPHOS 63 06/09/2017 0901   AST 31 05/18/2018 0958   AST 35 (H) 06/09/2017 0901   ALT 14 05/18/2018 0958   ALT 23 06/09/2017 0901   BILITOT 0.7 05/18/2018 0958   BILITOT 0.65 06/09/2017 0901       RADIOGRAPHIC STUDIES: Ct Chest W Contrast  Result Date: 06/06/2018 CLINICAL DATA:  Patient with history of metastatic non-small cell lung cancer status post chemotherapy. Follow-up exam. EXAM: CT CHEST, ABDOMEN, AND PELVIS WITH CONTRAST TECHNIQUE: Multidetector CT imaging of the chest, abdomen and pelvis was performed following the standard protocol during bolus administration of intravenous contrast. CONTRAST:  38m OMNIPAQUE IOHEXOL 300 MG/ML  SOLN COMPARISON:  CT CAP 04/05/2018 FINDINGS: CT CHEST FINDINGS Cardiovascular: Right anterior chest wall Port-A-Cath is present with tip terminating in the superior vena cava. Normal heart size. Trace fluid superior pericardial recess. Thoracic aortic vascular calcifications. Mediastinum/Nodes: No enlarged axillary, mediastinal or hilar lymphadenopathy. Normal appearance of the esophagus. Lungs/Pleura: Multiple bilateral pulmonary nodules are redemonstrated and similar when compared to recent prior exam. Larger nodular conglomerate within the right lower lobe measures 2.4 x 1.9 cm (image 79; series 6), previously 2.3 x 1.8 cm. Unchanged 0.8 cm nodule superior segment right lower lobe (image 45; series 6). Unchanged 0.7 cm right middle lobe nodule (image 74; series 6). Unchanged 0.9 cm nodule right upper lobe (image 42; series 6). Unchanged 0.5 cm left upper lobe nodule (image 38; series 6). Unchanged 1.3 cm nodule left lower lobe (image 59; series 6). No pleural effusion or pneumothorax. Musculoskeletal: Similar-appearing sclerotic foci within the spine and ribs. CT ABDOMEN PELVIS FINDINGS  Hepatobiliary: Liver is normal in size and contour. No focal hepatic lesion is identified. Multiple stones in the gallbladder lumen. No gallbladder wall thickening or pericholecystic fluid. No intrahepatic or extrahepatic biliary ductal dilatation. Pancreas: Unremarkable Spleen: Unremarkable Adrenals/Urinary Tract: Normal adrenal glands. Kidneys enhance symmetrically with contrast. Malpositioned left kidney. Unchanged small cyst left kidney. Stomach/Bowel: No abnormal bowel wall thickening or evidence for bowel obstruction. No free fluid or free intraperitoneal air. Vascular/Lymphatic: Normal caliber abdominal aorta. Peripheral calcified atherosclerotic plaque. No retroperitoneal lymphadenopathy. Reproductive: Uterus and adnexal structures are unremarkable. Other: None. Musculoskeletal: Similar-appearing sclerotic lesions throughout the spine and pelvis. IMPRESSION: 1. Similar-appearing bilateral pulmonary nodules. 2. No mediastinal adenopathy. 3. Stable sclerotic osseous metastatic disease throughout the spine and pelvis. Electronically Signed   By: DLovey NewcomerM.D.   On: 06/06/2018 09:15   Ct Abdomen Pelvis W Contrast  Result Date: 06/06/2018  CLINICAL DATA:  Patient with history of metastatic non-small cell lung cancer status post chemotherapy. Follow-up exam. EXAM: CT CHEST, ABDOMEN, AND PELVIS WITH CONTRAST TECHNIQUE: Multidetector CT imaging of the chest, abdomen and pelvis was performed following the standard protocol during bolus administration of intravenous contrast. CONTRAST:  54m OMNIPAQUE IOHEXOL 300 MG/ML  SOLN COMPARISON:  CT CAP 04/05/2018 FINDINGS: CT CHEST FINDINGS Cardiovascular: Right anterior chest wall Port-A-Cath is present with tip terminating in the superior vena cava. Normal heart size. Trace fluid superior pericardial recess. Thoracic aortic vascular calcifications. Mediastinum/Nodes: No enlarged axillary, mediastinal or hilar lymphadenopathy. Normal appearance of the esophagus.  Lungs/Pleura: Multiple bilateral pulmonary nodules are redemonstrated and similar when compared to recent prior exam. Larger nodular conglomerate within the right lower lobe measures 2.4 x 1.9 cm (image 79; series 6), previously 2.3 x 1.8 cm. Unchanged 0.8 cm nodule superior segment right lower lobe (image 45; series 6). Unchanged 0.7 cm right middle lobe nodule (image 74; series 6). Unchanged 0.9 cm nodule right upper lobe (image 42; series 6). Unchanged 0.5 cm left upper lobe nodule (image 38; series 6). Unchanged 1.3 cm nodule left lower lobe (image 59; series 6). No pleural effusion or pneumothorax. Musculoskeletal: Similar-appearing sclerotic foci within the spine and ribs. CT ABDOMEN PELVIS FINDINGS Hepatobiliary: Liver is normal in size and contour. No focal hepatic lesion is identified. Multiple stones in the gallbladder lumen. No gallbladder wall thickening or pericholecystic fluid. No intrahepatic or extrahepatic biliary ductal dilatation. Pancreas: Unremarkable Spleen: Unremarkable Adrenals/Urinary Tract: Normal adrenal glands. Kidneys enhance symmetrically with contrast. Malpositioned left kidney. Unchanged small cyst left kidney. Stomach/Bowel: No abnormal bowel wall thickening or evidence for bowel obstruction. No free fluid or free intraperitoneal air. Vascular/Lymphatic: Normal caliber abdominal aorta. Peripheral calcified atherosclerotic plaque. No retroperitoneal lymphadenopathy. Reproductive: Uterus and adnexal structures are unremarkable. Other: None. Musculoskeletal: Similar-appearing sclerotic lesions throughout the spine and pelvis. IMPRESSION: 1. Similar-appearing bilateral pulmonary nodules. 2. No mediastinal adenopathy. 3. Stable sclerotic osseous metastatic disease throughout the spine and pelvis. Electronically Signed   By: DLovey NewcomerM.D.   On: 06/06/2018 09:15    ASSESSMENT AND PLAN:  This is a very pleasant 73years old Hispanic female with metastatic non-small cell lung cancer,  adenocarcinoma status post induction systemic chemotherapy with carboplatin and Alimta and she is currently on maintenance treatment with single agent Alimta status post 38 cycles. She has been tolerating this treatment well with no concerning adverse effects except for fatigue. The patient had repeat CT scan of the chest, abdomen and pelvis performed recently.  I personally and independently reviewed the scans and discussed the results with the patient and her sister.  Her scan showed no concerning findings for disease progression. I recommended for the patient to continue her current treatment with maintenance Alimta and she will proceed with cycle #9 today. For the chemotherapy-induced anemia, she will continue on oral iron tablets for now and we will continue to monitor her hemoglobin hematocrit closely and consider the patient for transfusion if her hemoglobin is less than 8.0G/DL. For the back pain, the patient will continue on Percocet for now.  I gave her the option of treatment with Neurontin but she would like to hold on this option for now. The patient will come back for follow-up visit in 3 weeks for evaluation before starting the next cycle of her treatment. She was advised to call immediately if she has any concerning symptoms in the interval. The patient voices understanding of current disease status and treatment  options and is in agreement with the current care plan. All questions were answered. The patient knows to call the clinic with any problems, questions or concerns. We can certainly see the patient much sooner if necessary.  Disclaimer: This note was dictated with voice recognition software. Similar sounding words can inadvertently be transcribed and may not be corrected upon review.

## 2018-06-08 NOTE — Patient Instructions (Signed)
[facility] Discharge Instructions for Patients Receiving Chemotherapy  Today you received the following chemotherapy agents Pemetrexed (ALIMTA).  To help prevent nausea and vomiting after your treatment, we encourage you to take your nausea medication as prescribed.   If you develop nausea and vomiting that is not controlled by your nausea medication, call the clinic.   BELOW ARE SYMPTOMS THAT SHOULD BE REPORTED IMMEDIATELY:  *FEVER GREATER THAN 100.5 F  *CHILLS WITH OR WITHOUT FEVER  NAUSEA AND VOMITING THAT IS NOT CONTROLLED WITH YOUR NAUSEA MEDICATION  *UNUSUAL SHORTNESS OF BREATH  *UNUSUAL BRUISING OR BLEEDING  TENDERNESS IN MOUTH AND THROAT WITH OR WITHOUT PRESENCE OF ULCERS  *URINARY PROBLEMS  *BOWEL PROBLEMS  UNUSUAL RASH Items with * indicate a potential emergency and should be followed up as soon as possible.  Feel free to call the clinic should you have any questions or concerns. The clinic phone number is [phone].  Please show the [facility] at check-in to the Emergency Department and triage nurse.  Zoledronic Acid injection (Hypercalcemia, Oncology) What is this medicine? ZOLEDRONIC ACID (ZOE le dron ik AS id) lowers the amount of calcium loss from bone. It is used to treat too much calcium in your blood from cancer. It is also used to prevent complications of cancer that has spread to the bone. This medicine may be used for other purposes; ask your health care provider or pharmacist if you have questions. COMMON BRAND NAME(S): Zometa What should I tell my health care provider before I take this medicine? They need to know if you have any of these conditions: -aspirin-sensitive asthma -cancer, especially if you are receiving medicines used to treat cancer -dental disease or wear dentures -infection -kidney disease -receiving corticosteroids like dexamethasone or prednisone -an unusual or allergic reaction to zoledronic acid, other  medicines, foods, dyes, or preservatives -pregnant or trying to get pregnant -breast-feeding How should I use this medicine? This medicine is for infusion into a vein. It is given by a health care professional in a hospital or clinic setting. Talk to your pediatrician regarding the use of this medicine in children. Special care may be needed. Overdosage: If you think you have taken too much of this medicine contact a poison control center or emergency room at once. NOTE: This medicine is only for you. Do not share this medicine with others. What if I miss a dose? It is important not to miss your dose. Call your doctor or health care professional if you are unable to keep an appointment. What may interact with this medicine? -certain antibiotics given by injection -NSAIDs, medicines for pain and inflammation, like ibuprofen or naproxen -some diuretics like bumetanide, furosemide -teriparatide -thalidomide This list may not describe all possible interactions. Give your health care provider a list of all the medicines, herbs, non-prescription drugs, or dietary supplements you use. Also tell them if you smoke, drink alcohol, or use illegal drugs. Some items may interact with your medicine. What should I watch for while using this medicine? Visit your doctor or health care professional for regular checkups. It may be some time before you see the benefit from this medicine. Do not stop taking your medicine unless your doctor tells you to. Your doctor may order blood tests or other tests to see how you are doing. Women should inform their doctor if they wish to become pregnant or think they might be pregnant. There is a potential for serious side effects to an unborn child. Talk  to your health care professional or pharmacist for more information. You should make sure that you get enough calcium and vitamin D while you are taking this medicine. Discuss the foods you eat and the vitamins you take with  your health care professional. Some people who take this medicine have severe bone, joint, and/or muscle pain. This medicine may also increase your risk for jaw problems or a broken thigh bone. Tell your doctor right away if you have severe pain in your jaw, bones, joints, or muscles. Tell your doctor if you have any pain that does not go away or that gets worse. Tell your dentist and dental surgeon that you are taking this medicine. You should not have major dental surgery while on this medicine. See your dentist to have a dental exam and fix any dental problems before starting this medicine. Take good care of your teeth while on this medicine. Make sure you see your dentist for regular follow-up appointments. What side effects may I notice from receiving this medicine? Side effects that you should report to your doctor or health care professional as soon as possible: -allergic reactions like skin rash, itching or hives, swelling of the face, lips, or tongue -anxiety, confusion, or depression -breathing problems -changes in vision -eye pain -feeling faint or lightheaded, falls -jaw pain, especially after dental work -mouth sores -muscle cramps, stiffness, or weakness -redness, blistering, peeling or loosening of the skin, including inside the mouth -trouble passing urine or change in the amount of urine Side effects that usually do not require medical attention (report to your doctor or health care professional if they continue or are bothersome): -bone, joint, or muscle pain -constipation -diarrhea -fever -hair loss -irritation at site where injected -loss of appetite -nausea, vomiting -stomach upset -trouble sleeping -trouble swallowing -weak or tired This list may not describe all possible side effects. Call your doctor for medical advice about side effects. You may report side effects to FDA at 1-800-FDA-1088. Where should I keep my medicine? This drug is given in a hospital or  clinic and will not be stored at home. NOTE: This sheet is a summary. It may not cover all possible information. If you have questions about this medicine, talk to your doctor, pharmacist, or health care provider.  2019 Elsevier/Gold Standard (2013-10-20 14:19:39)

## 2018-06-29 ENCOUNTER — Inpatient Hospital Stay (HOSPITAL_BASED_OUTPATIENT_CLINIC_OR_DEPARTMENT_OTHER): Payer: Medicare Other | Admitting: Internal Medicine

## 2018-06-29 ENCOUNTER — Encounter: Payer: Self-pay | Admitting: Internal Medicine

## 2018-06-29 ENCOUNTER — Inpatient Hospital Stay: Payer: Medicare Other

## 2018-06-29 VITALS — BP 134/85 | HR 84 | Temp 98.7°F | Resp 18 | Ht 69.0 in | Wt 151.9 lb

## 2018-06-29 DIAGNOSIS — I10 Essential (primary) hypertension: Secondary | ICD-10-CM

## 2018-06-29 DIAGNOSIS — M545 Low back pain: Secondary | ICD-10-CM

## 2018-06-29 DIAGNOSIS — C3491 Malignant neoplasm of unspecified part of right bronchus or lung: Secondary | ICD-10-CM

## 2018-06-29 DIAGNOSIS — C3431 Malignant neoplasm of lower lobe, right bronchus or lung: Secondary | ICD-10-CM

## 2018-06-29 DIAGNOSIS — C7951 Secondary malignant neoplasm of bone: Secondary | ICD-10-CM | POA: Diagnosis not present

## 2018-06-29 DIAGNOSIS — Z95828 Presence of other vascular implants and grafts: Secondary | ICD-10-CM

## 2018-06-29 DIAGNOSIS — D6481 Anemia due to antineoplastic chemotherapy: Secondary | ICD-10-CM | POA: Diagnosis not present

## 2018-06-29 DIAGNOSIS — Z5111 Encounter for antineoplastic chemotherapy: Secondary | ICD-10-CM

## 2018-06-29 LAB — CMP (CANCER CENTER ONLY)
ALT: 13 U/L (ref 0–44)
AST: 27 U/L (ref 15–41)
Albumin: 3.4 g/dL — ABNORMAL LOW (ref 3.5–5.0)
Alkaline Phosphatase: 67 U/L (ref 38–126)
Anion gap: 9 (ref 5–15)
BUN: 21 mg/dL (ref 8–23)
CO2: 27 mmol/L (ref 22–32)
Calcium: 10.1 mg/dL (ref 8.9–10.3)
Chloride: 103 mmol/L (ref 98–111)
Creatinine: 1.02 mg/dL — ABNORMAL HIGH (ref 0.44–1.00)
GFR, Est AFR Am: >60 mL/min (ref >60)
GFR, Estimated: 56 mL/min — ABNORMAL LOW (ref >60)
Glucose, Bld: 126 mg/dL — ABNORMAL HIGH (ref 70–99)
Potassium: 4.1 mmol/L (ref 3.5–5.1)
Sodium: 139 mmol/L (ref 135–145)
Total Bilirubin: 0.5 mg/dL (ref 0.3–1.2)
Total Protein: 6.9 g/dL (ref 6.5–8.1)

## 2018-06-29 LAB — CBC WITH DIFFERENTIAL (CANCER CENTER ONLY)
Abs Immature Granulocytes: 0.02 10*3/uL (ref 0.00–0.07)
Basophils Absolute: 0 10*3/uL (ref 0.0–0.1)
Basophils Relative: 0 %
Eosinophils Absolute: 0 10*3/uL (ref 0.0–0.5)
Eosinophils Relative: 0 %
HCT: 29.4 % — ABNORMAL LOW (ref 36.0–46.0)
Hemoglobin: 9.8 g/dL — ABNORMAL LOW (ref 12.0–15.0)
Immature Granulocytes: 0 %
Lymphocytes Relative: 11 %
Lymphs Abs: 0.6 10*3/uL — ABNORMAL LOW (ref 0.7–4.0)
MCH: 34.1 pg — ABNORMAL HIGH (ref 26.0–34.0)
MCHC: 33.3 g/dL (ref 30.0–36.0)
MCV: 102.4 fL — ABNORMAL HIGH (ref 80.0–100.0)
Monocytes Absolute: 0.5 10*3/uL (ref 0.1–1.0)
Monocytes Relative: 9 %
Neutro Abs: 4.2 10*3/uL (ref 1.7–7.7)
Neutrophils Relative %: 80 %
Platelet Count: 267 10*3/uL (ref 150–400)
RBC: 2.87 MIL/uL — ABNORMAL LOW (ref 3.87–5.11)
RDW: 14.5 % (ref 11.5–15.5)
WBC Count: 5.3 10*3/uL (ref 4.0–10.5)
nRBC: 0 % (ref 0.0–0.2)

## 2018-06-29 MED ORDER — SODIUM CHLORIDE 0.9% FLUSH
10.0000 mL | INTRAVENOUS | Status: DC | PRN
  Administered 2018-06-29: 10 mL
  Filled 2018-06-29: qty 10

## 2018-06-29 MED ORDER — PROCHLORPERAZINE MALEATE 10 MG PO TABS
ORAL_TABLET | ORAL | Status: AC
  Filled 2018-06-29: qty 1

## 2018-06-29 MED ORDER — CYANOCOBALAMIN 1000 MCG/ML IJ SOLN
1000.0000 ug | Freq: Once | INTRAMUSCULAR | Status: AC
  Administered 2018-06-29: 1000 ug via INTRAMUSCULAR

## 2018-06-29 MED ORDER — CYANOCOBALAMIN 1000 MCG/ML IJ SOLN
INTRAMUSCULAR | Status: AC
  Filled 2018-06-29: qty 1

## 2018-06-29 MED ORDER — SODIUM CHLORIDE 0.9% FLUSH
10.0000 mL | Freq: Once | INTRAVENOUS | Status: AC
  Administered 2018-06-29: 10 mL
  Filled 2018-06-29: qty 10

## 2018-06-29 MED ORDER — SODIUM CHLORIDE 0.9 % IV SOLN
Freq: Once | INTRAVENOUS | Status: AC
  Administered 2018-06-29: 10:00:00 via INTRAVENOUS
  Filled 2018-06-29: qty 250

## 2018-06-29 MED ORDER — SODIUM CHLORIDE 0.9 % IV SOLN
530.0000 mg/m2 | Freq: Once | INTRAVENOUS | Status: AC
  Administered 2018-06-29: 1000 mg via INTRAVENOUS
  Filled 2018-06-29: qty 40

## 2018-06-29 MED ORDER — HEPARIN SOD (PORK) LOCK FLUSH 100 UNIT/ML IV SOLN
500.0000 [IU] | Freq: Once | INTRAVENOUS | Status: AC | PRN
  Administered 2018-06-29: 500 [IU]
  Filled 2018-06-29: qty 5

## 2018-06-29 MED ORDER — PROCHLORPERAZINE MALEATE 10 MG PO TABS
10.0000 mg | ORAL_TABLET | Freq: Once | ORAL | Status: AC
  Administered 2018-06-29: 10 mg via ORAL

## 2018-06-29 NOTE — Patient Instructions (Signed)
[place] Discharge Instructions for Patients Receiving Chemotherapy  Today you received the following chemotherapy agents Pemetrexed (ALIMTA).  To help prevent nausea and vomiting after your treatment, we encourage you to take your nausea medication as prescribed.   If you develop nausea and vomiting that is not controlled by your nausea medication, call the clinic.   BELOW ARE SYMPTOMS THAT SHOULD BE REPORTED IMMEDIATELY:  *FEVER GREATER THAN 100.5 F  *CHILLS WITH OR WITHOUT FEVER  NAUSEA AND VOMITING THAT IS NOT CONTROLLED WITH YOUR NAUSEA MEDICATION  *UNUSUAL SHORTNESS OF BREATH  *UNUSUAL BRUISING OR BLEEDING  TENDERNESS IN MOUTH AND THROAT WITH OR WITHOUT PRESENCE OF ULCERS  *URINARY PROBLEMS  *BOWEL PROBLEMS  UNUSUAL RASH Items with * indicate a potential emergency and should be followed up as soon as possible.  Feel free to call the clinic should you have any questions or concerns. The clinic phone number is [phone].  Please show the [place] at check-in to the Emergency Department and triage nurse.  Zoledronic Acid injection (Hypercalcemia, Oncology) What is this medicine? ZOLEDRONIC ACID (ZOE le dron ik AS id) lowers the amount of calcium loss from bone. It is used to treat too much calcium in your blood from cancer. It is also used to prevent complications of cancer that has spread to the bone. This medicine may be used for other purposes; ask your health care provider or pharmacist if you have questions. COMMON BRAND NAME(S): Zometa What should I tell my health care provider before I take this medicine? They need to know if you have any of these conditions: -aspirin-sensitive asthma -cancer, especially if you are receiving medicines used to treat cancer -dental disease or wear dentures -infection -kidney disease -receiving corticosteroids like dexamethasone or prednisone -an unusual or allergic reaction to zoledronic acid, other  medicines, foods, dyes, or preservatives -pregnant or trying to get pregnant -breast-feeding How should I use this medicine? This medicine is for infusion into a vein. It is given by a health care professional in a hospital or clinic setting. Talk to your pediatrician regarding the use of this medicine in children. Special care may be needed. Overdosage: If you think you have taken too much of this medicine contact a poison control center or emergency room at once. NOTE: This medicine is only for you. Do not share this medicine with others. What if I miss a dose? It is important not to miss your dose. Call your doctor or health care professional if you are unable to keep an appointment. What may interact with this medicine? -certain antibiotics given by injection -NSAIDs, medicines for pain and inflammation, like ibuprofen or naproxen -some diuretics like bumetanide, furosemide -teriparatide -thalidomide This list may not describe all possible interactions. Give your health care provider a list of all the medicines, herbs, non-prescription drugs, or dietary supplements you use. Also tell them if you smoke, drink alcohol, or use illegal drugs. Some items may interact with your medicine. What should I watch for while using this medicine? Visit your doctor or health care professional for regular checkups. It may be some time before you see the benefit from this medicine. Do not stop taking your medicine unless your doctor tells you to. Your doctor may order blood tests or other tests to see how you are doing. Women should inform their doctor if they wish to become pregnant or think they might be pregnant. There is a potential for serious side effects to an unborn child. Talk  to your health care professional or pharmacist for more information. You should make sure that you get enough calcium and vitamin D while you are taking this medicine. Discuss the foods you eat and the vitamins you take with  your health care professional. Some people who take this medicine have severe bone, joint, and/or muscle pain. This medicine may also increase your risk for jaw problems or a broken thigh bone. Tell your doctor right away if you have severe pain in your jaw, bones, joints, or muscles. Tell your doctor if you have any pain that does not go away or that gets worse. Tell your dentist and dental surgeon that you are taking this medicine. You should not have major dental surgery while on this medicine. See your dentist to have a dental exam and fix any dental problems before starting this medicine. Take good care of your teeth while on this medicine. Make sure you see your dentist for regular follow-up appointments. What side effects may I notice from receiving this medicine? Side effects that you should report to your doctor or health care professional as soon as possible: -allergic reactions like skin rash, itching or hives, swelling of the face, lips, or tongue -anxiety, confusion, or depression -breathing problems -changes in vision -eye pain -feeling faint or lightheaded, falls -jaw pain, especially after dental work -mouth sores -muscle cramps, stiffness, or weakness -redness, blistering, peeling or loosening of the skin, including inside the mouth -trouble passing urine or change in the amount of urine Side effects that usually do not require medical attention (report to your doctor or health care professional if they continue or are bothersome): -bone, joint, or muscle pain -constipation -diarrhea -fever -hair loss -irritation at site where injected -loss of appetite -nausea, vomiting -stomach upset -trouble sleeping -trouble swallowing -weak or tired This list may not describe all possible side effects. Call your doctor for medical advice about side effects. You may report side effects to FDA at 1-800-FDA-1088. Where should I keep my medicine? This drug is given in a hospital or  clinic and will not be stored at home. NOTE: This sheet is a summary. It may not cover all possible information. If you have questions about this medicine, talk to your doctor, pharmacist, or health care provider.  2019 Elsevier/Gold Standard (2013-10-20 14:19:39)

## 2018-06-29 NOTE — Patient Instructions (Signed)

## 2018-06-29 NOTE — Progress Notes (Signed)
[facility] [phone]   Fax:(336) [place], MD [address] [place] [address]  DIAGNOSIS: Stage IV (T1b, N2, M1b) non-small cell lung cancer, adenocarcinoma diagnosed in March 2017 and presented with right lower lobe lung nodule in addition to mediastinal lymphadenopathy and metastatic bone lesions.  Molecular studies: PDL 1 TPS  <1%.   Foundation One Studies: Positive for ERBB2 [id number]. Negative for EGFR, KRAS, ALK, BRAF, MET, RET and ROS1.  PRIOR THERAPY:  1) Induction systemic chemotherapy with carboplatin for AUC of 5 and Alimta 500 MG/M2 every 3 weeks is status post 6 cycles at [facility], last dose was given 03/05/2016 with stable disease. 2) palliative radiation to the metastatic bone disease in the lower back and pelvic area.  CURRENT THERAPY::  1)  Maintenance systemic chemotherapy with Alimta 500 MG/M2 every 3 weeks status post 39 cycles. First dose was given 03/25/2016.  2) Zometa 4 mg IV every 12 week for metastatic bone disease.  INTERVAL HIST Valerie Long 73 y.o. female returns to the clinic today for follow-up visit accompanied by her sister.  The patient is feeling fine today with no specific complaints except for the intermittent low back pain with occasional radiation to the legs.  She denied having any chest pain but has occasional shortness of breath with no cough or hemoptysis.  She has no nausea, vomiting, diarrhea or constipation.  She denied having any fever or chills.  She is here today for evaluation before starting cycle #40 of her maintenance therapy.  MEDICAL HISTORY: Past Medical History:  Diagnosis Date  . Adenocarcinoma of right lung, stage 4 ([place]) 2017  . Anemia   . Arthritis   . Bone metastases ([place]) 02/24/2017  . Encounter for antineoplastic chemotherapy 03/17/2016  . Hypercholesterolemia   . Hypertension 06/18/2016  . Osteopenia   .  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 Pneumonia   . Shortness of breath dyspnea     ALLERGIES:  has No Known Allergies.  MEDICATIONS:  Current Outpatient Medications  Medication Sig Dispense Refill  . acetaminophen (TYLENOL) 500 MG tablet Take 1,000 mg by mouth every 6 (six) hours as needed for moderate pain or headache.    . baclofen (LIORESAL) 10 MG tablet Take 1 tablet (10 mg total) by mouth 3 (three) times daily. 30 each 0  . benzonatate (TESSALON) 100 MG capsule Take 1 capsule (100 mg total) by mouth 3 (three) times daily as needed for cough. (Patient not taking: Reported on 06/08/2018) 40 capsule 1  . Calcium Carb-Cholecalciferol (CALCIUM 600/VITAMIN D3 PO) Take by mouth.    . Cyanocobalamin (B-12 PO) Take 2 tablets by mouth daily.    Marland Kitchen dexamethasone (DECADRON) 4 MG tablet Take 51m by mouth twice daily the day before, of, and after chemo 40 tablet 1  . Fish Oil-Cholecalciferol (OMEGA-3 + VITAMIN D3 PO) Take 1 tablet by mouth daily.    . folic acid (FOLVITE) 1 MG tablet Take 1 tablet (1 mg total) by mouth daily. 90 tablet 1  . ketorolac (ACULAR) 0.5 % ophthalmic solution Place 1 drop into both eyes 4 (four) times daily.  0  . lidocaine-prilocaine (EMLA) cream Apply 1 application topically as needed. Apply 1-2 tsp over port site 1-2 hours prior to chemo. 30 g 0  . meloxicam (MOBIC) 15 MG tablet TAKE 1 TABLET BY MOUTH EVERY DAY  AS NEEDED FOR PAIN 30 tablet 0  . neomycin-polymyxin b-dexamethasone (MAXITROL) 3.5-10000-0.1 SUSP INSTILL 1 DROP BY OPHTHALMIC ROUTE 4 TIMES PER DAY INTO BOTH EYES.  1  . ofloxacin (OCUFLOX) 0.3 % ophthalmic solution Place 1 drop into both eyes 4 (four) times daily.  0  . ondansetron (ZOFRAN) 8 MG tablet Take 8 mg by mouth every 8 (eight) hours as needed.    Marland Kitchen OVER THE COUNTER MEDICATION Apply 1 drop topically daily as needed. 1 drop CBD oil in vicks vapor rub . Applied to right thigh for pain relief.    Marland Kitchen oxyCODONE-acetaminophen (PERCOCET/ROXICET)  5-325 MG tablet Take 1 tablet by mouth every 8 (eight) hours as needed for severe pain. (Patient not taking: Reported on 06/08/2018) 30 tablet 0  . prednisoLONE acetate (PRED FORTE) 1 % ophthalmic suspension Place 1 drop into both eyes 4 (four) times daily.  0  . PRESCRIPTION MEDICATION Inject 1 Dose as directed every 21 ( twenty-one) days. Chemo    . prochlorperazine (COMPAZINE) 10 MG tablet Take 1 tablet (10 mg total) by mouth every 6 (six) hours as needed for nausea or vomiting. (Patient not taking: Reported on 06/08/2018) 30 tablet 0   No current facility-administered medications for this visit.     SURGICAL HISTORY:  Past Surgical History:  Procedure Laterality Date  . CESAREAN SECTION     myomectomy  . COLONOSCOPY W/ POLYPECTOMY    . IR GENERIC HISTORICAL  08/17/2016   IR US GUIDE VASC ACCESS RIGHT 08/17/2016 [name], MD [facility]  . IR GENERIC HISTORICAL  08/17/2016   IR FLUORO GUIDE PORT INSERTION RIGHT 08/17/2016 [name], MD [facility]  . MEDIASTINOSCOPY N/A 09/25/2015   Procedure: MEDIASTINOSCOPY;  Surgeon: Melrose Nakayama, MD;  Location: [facility];  Service: Thoracic;  Laterality: N/A;  . VIDEO BRONCHOSCOPY Bilateral 08/19/2015   Procedure: VIDEO BRONCHOSCOPY WITH FLUORO;  Surgeon: Rigoberto Noel, MD;  Location: [facility];  Service: Cardiopulmonary;  Laterality: Bilateral;  . VIDEO BRONCHOSCOPY N/A 09/08/2015   Procedure: VIDEO BRONCHOSCOPY WITH FLUORO;  Surgeon: Rigoberto Noel, MD;  Location: [facility];  Service: Thoracic;  Laterality: N/A;  . VIDEO BRONCHOSCOPY WITH ENDOBRONCHIAL ULTRASOUND Right 09/08/2015   Procedure: ATTEMPTED VIDEO BRONCHOSCOPY ENDOBRONCHIAL ULTRASOUND  ;  Surgeon: Rigoberto Noel, MD;  Location: [facility];  Service: Thoracic;  Laterality: Right;    REVIEW OF SYSTEMS:  A comprehensive review of systems was negative except for: Constitutional: positive for fatigue Respiratory: positive for dyspnea on exertion Musculoskeletal: positive for arthralgias     PHYSICAL EXAMINATION: General appearance: alert, cooperative, fatigued and no distress Head: Normocephalic, without obvious abnormality, atraumatic Neck: no adenopathy, no JVD, supple, symmetrical, trachea midline and thyroid not enlarged, symmetric, no tenderness/mass/nodules Lymph nodes: Cervical, supraclavicular, and axillary nodes normal. Resp: clear to auscultation bilaterally Back: symmetric, no curvature. ROM normal. No CVA tenderness. Cardio: regular rate and rhythm, S1, S2 normal, no murmur, click, rub or gallop GI: soft, non-tender; bowel sounds normal; no masses,  no organomegaly Extremities: extremities normal, atraumatic, no cyanosis or edema  ECOG PERFORMANCE STATUS: 1 - Symptomatic but completely ambulatory  Blood pressure 134/85, pulse 84, temperature 98.7 F (37.1 C), temperature source Oral, resp. rate 18, height '5\' 9"'  (1.753 m), weight 151 lb 14.4 oz (68.9 kg), last menstrual period 06/09/1998, SpO2 100 %.  LABORATORY DATA: Lab Results  Component Value Date   WBC 5.3 06/29/2018   HGB 9.8 (L) 06/29/2018   HCT 29.4 (L) 06/29/2018   MCV 102.4 (  H) 06/29/2018   PLT 267 06/29/2018      Chemistry      Component Value Date/Time   NA 140 06/08/2018 0928   NA 137 06/09/2017 0901   K 3.8 06/08/2018 0928   K 3.9 06/09/2017 0901   CL 107 06/08/2018 0928   CO2 24 06/08/2018 0928   CO2 25 06/09/2017 0901   BUN 23 06/08/2018 0928   BUN 19.0 06/09/2017 0901   CREATININE 1.18 (H) 06/08/2018 0928   CREATININE 1.2 (H) 06/09/2017 0901   GLU 170 01/23/2016      Component Value Date/Time   CALCIUM 9.8 06/08/2018 0928   CALCIUM 9.9 06/09/2017 0901   ALKPHOS 62 06/08/2018 0928   ALKPHOS 63 06/09/2017 0901   AST 28 06/08/2018 0928   AST 35 (H) 06/09/2017 0901   ALT 15 06/08/2018 0928   ALT 23 06/09/2017 0901   BILITOT 0.5 06/08/2018 0928   BILITOT 0.65 06/09/2017 0901       RADIOGRAPHIC STUDIES: Ct Chest W Contrast  Result Date: 06/06/2018 CLINICAL DATA:   Patient with history of metastatic non-small cell lung cancer status post chemotherapy. Follow-up exam. EXAM: CT CHEST, ABDOMEN, AND PELVIS WITH CONTRAST TECHNIQUE: Multidetector CT imaging of the chest, abdomen and pelvis was performed following the standard protocol during bolus administration of intravenous contrast. CONTRAST:  24m OMNIPAQUE IOHEXOL 300 MG/ML  SOLN COMPARISON:  CT CAP 04/05/2018 FINDINGS: CT CHEST FINDINGS Cardiovascular: Right anterior chest wall Port-A-Cath is present with tip terminating in the superior vena cava. Normal heart size. Trace fluid superior pericardial recess. Thoracic aortic vascular calcifications. Mediastinum/Nodes: No enlarged axillary, mediastinal or hilar lymphadenopathy. Normal appearance of the esophagus. Lungs/Pleura: Multiple bilateral pulmonary nodules are redemonstrated and similar when compared to recent prior exam. Larger nodular conglomerate within the right lower lobe measures 2.4 x 1.9 cm (image 79; series 6), previously 2.3 x 1.8 cm. Unchanged 0.8 cm nodule superior segment right lower lobe (image 45; series 6). Unchanged 0.7 cm right middle lobe nodule (image 74; series 6). Unchanged 0.9 cm nodule right upper lobe (image 42; series 6). Unchanged 0.5 cm left upper lobe nodule (image 38; series 6). Unchanged 1.3 cm nodule left lower lobe (image 59; series 6). No pleural effusion or pneumothorax. Musculoskeletal: Similar-appearing sclerotic foci within the spine and ribs. CT ABDOMEN PELVIS FINDINGS Hepatobiliary: Liver is normal in size and contour. No focal hepatic lesion is identified. Multiple stones in the gallbladder lumen. No gallbladder wall thickening or pericholecystic fluid. No intrahepatic or extrahepatic biliary ductal dilatation. Pancreas: Unremarkable Spleen: Unremarkable Adrenals/Urinary Tract: Normal adrenal glands. Kidneys enhance symmetrically with contrast. Malpositioned left kidney. Unchanged small cyst left kidney. Stomach/Bowel: No abnormal  bowel wall thickening or evidence for bowel obstruction. No free fluid or free intraperitoneal air. Vascular/Lymphatic: Normal caliber abdominal aorta. Peripheral calcified atherosclerotic plaque. No retroperitoneal lymphadenopathy. Reproductive: Uterus and adnexal structures are unremarkable. Other: None. Musculoskeletal: Similar-appearing sclerotic lesions throughout the spine and pelvis. IMPRESSION: 1. Similar-appearing bilateral pulmonary nodules. 2. No mediastinal adenopathy. 3. Stable sclerotic osseous metastatic disease throughout the spine and pelvis. Electronically Signed   By: DLovey NewcomerM.D.   On: 06/06/2018 09:15   Ct Abdomen Pelvis W Contrast  Result Date: 06/06/2018 CLINICAL DATA:  Patient with history of metastatic non-small cell lung cancer status post chemotherapy. Follow-up exam. EXAM: CT CHEST, ABDOMEN, AND PELVIS WITH CONTRAST TECHNIQUE: Multidetector CT imaging of the chest, abdomen and pelvis was performed following the standard protocol during bolus administration of intravenous contrast. CONTRAST:  885mOMNIPAQUE  IOHEXOL 300 MG/ML  SOLN COMPARISON:  CT CAP 04/05/2018 FINDINGS: CT CHEST FINDINGS Cardiovascular: Right anterior chest wall Port-A-Cath is present with tip terminating in the superior vena cava. Normal heart size. Trace fluid superior pericardial recess. Thoracic aortic vascular calcifications. Mediastinum/Nodes: No enlarged axillary, mediastinal or hilar lymphadenopathy. Normal appearance of the esophagus. Lungs/Pleura: Multiple bilateral pulmonary nodules are redemonstrated and similar when compared to recent prior exam. Larger nodular conglomerate within the right lower lobe measures 2.4 x 1.9 cm (image 79; series 6), previously 2.3 x 1.8 cm. Unchanged 0.8 cm nodule superior segment right lower lobe (image 45; series 6). Unchanged 0.7 cm right middle lobe nodule (image 74; series 6). Unchanged 0.9 cm nodule right upper lobe (image 42; series 6). Unchanged 0.5 cm left upper  lobe nodule (image 38; series 6). Unchanged 1.3 cm nodule left lower lobe (image 59; series 6). No pleural effusion or pneumothorax. Musculoskeletal: Similar-appearing sclerotic foci within the spine and ribs. CT ABDOMEN PELVIS FINDINGS Hepatobiliary: Liver is normal in size and contour. No focal hepatic lesion is identified. Multiple stones in the gallbladder lumen. No gallbladder wall thickening or pericholecystic fluid. No intrahepatic or extrahepatic biliary ductal dilatation. Pancreas: Unremarkable Spleen: Unremarkable Adrenals/Urinary Tract: Normal adrenal glands. Kidneys enhance symmetrically with contrast. Malpositioned left kidney. Unchanged small cyst left kidney. Stomach/Bowel: No abnormal bowel wall thickening or evidence for bowel obstruction. No free fluid or free intraperitoneal air. Vascular/Lymphatic: Normal caliber abdominal aorta. Peripheral calcified atherosclerotic plaque. No retroperitoneal lymphadenopathy. Reproductive: Uterus and adnexal structures are unremarkable. Other: None. Musculoskeletal: Similar-appearing sclerotic lesions throughout the spine and pelvis. IMPRESSION: 1. Similar-appearing bilateral pulmonary nodules. 2. No mediastinal adenopathy. 3. Stable sclerotic osseous metastatic disease throughout the spine and pelvis. Electronically Signed   By: Lovey Newcomer M.D.   On: 06/06/2018 09:15    ASSESSMENT AND PLAN:  This is a very pleasant 73 years old Hispanic female with metastatic non-small cell lung cancer, adenocarcinoma status post induction systemic chemotherapy with carboplatin and Alimta and she is currently on maintenance treatment with single agent Alimta status post 39 cycles. She has been tolerating this treatment well with no concerning adverse effects. I recommended for the patient to proceed with cycle #40 today as scheduled. For the chemotherapy-induced anemia, she will continue on oral iron tablets for now and we will continue to monitor her hemoglobin  hematocrit closely and consider the patient for transfusion if her hemoglobin is less than 8.0G/DL. For the back pain, the patient will continue on Percocet for now.  She will come back for follow-up visit in 3 weeks for evaluation before the next cycle of her treatment. The patient was advised to call immediately if she has any concerning symptoms in the interval. The patient voices understanding of current disease status and treatment options and is in agreement with the current care plan. All questions were answered. The patient knows to call the clinic with any problems, questions or concerns. We can certainly see the patient much sooner if necessary.  Disclaimer: This note was dictated with voice recognition software. Similar sounding words can inadvertently be transcribed and may not be corrected upon review.

## 2018-06-30 ENCOUNTER — Telehealth: Payer: Self-pay | Admitting: Internal Medicine

## 2018-06-30 NOTE — Telephone Encounter (Signed)
Per 01/23 los, scheduled another cycle. Patient will get new schedule at next appointment

## 2018-07-20 ENCOUNTER — Inpatient Hospital Stay: Payer: Medicare Other | Attending: Internal Medicine

## 2018-07-20 ENCOUNTER — Inpatient Hospital Stay (HOSPITAL_BASED_OUTPATIENT_CLINIC_OR_DEPARTMENT_OTHER): Payer: Medicare Other | Admitting: Internal Medicine

## 2018-07-20 ENCOUNTER — Inpatient Hospital Stay: Payer: Medicare Other

## 2018-07-20 ENCOUNTER — Encounter: Payer: Self-pay | Admitting: Internal Medicine

## 2018-07-20 ENCOUNTER — Telehealth: Payer: Self-pay

## 2018-07-20 VITALS — BP 138/73 | HR 85 | Temp 97.8°F | Resp 18 | Ht 69.0 in | Wt 151.3 lb

## 2018-07-20 DIAGNOSIS — Z5111 Encounter for antineoplastic chemotherapy: Secondary | ICD-10-CM

## 2018-07-20 DIAGNOSIS — Z95828 Presence of other vascular implants and grafts: Secondary | ICD-10-CM

## 2018-07-20 DIAGNOSIS — C7951 Secondary malignant neoplasm of bone: Secondary | ICD-10-CM | POA: Diagnosis not present

## 2018-07-20 DIAGNOSIS — C3491 Malignant neoplasm of unspecified part of right bronchus or lung: Secondary | ICD-10-CM

## 2018-07-20 DIAGNOSIS — C3431 Malignant neoplasm of lower lobe, right bronchus or lung: Secondary | ICD-10-CM

## 2018-07-20 DIAGNOSIS — H9202 Otalgia, left ear: Secondary | ICD-10-CM | POA: Diagnosis not present

## 2018-07-20 DIAGNOSIS — I10 Essential (primary) hypertension: Secondary | ICD-10-CM

## 2018-07-20 LAB — CBC WITH DIFFERENTIAL (CANCER CENTER ONLY)
Abs Immature Granulocytes: 0.01 10*3/uL (ref 0.00–0.07)
Basophils Absolute: 0 10*3/uL (ref 0.0–0.1)
Basophils Relative: 0 %
Eosinophils Absolute: 0 10*3/uL (ref 0.0–0.5)
Eosinophils Relative: 0 %
HCT: 29.5 % — ABNORMAL LOW (ref 36.0–46.0)
Hemoglobin: 9.7 g/dL — ABNORMAL LOW (ref 12.0–15.0)
Immature Granulocytes: 0 %
Lymphocytes Relative: 13 %
Lymphs Abs: 0.6 10*3/uL — ABNORMAL LOW (ref 0.7–4.0)
MCH: 33.8 pg (ref 26.0–34.0)
MCHC: 32.9 g/dL (ref 30.0–36.0)
MCV: 102.8 fL — ABNORMAL HIGH (ref 80.0–100.0)
Monocytes Absolute: 0.4 10*3/uL (ref 0.1–1.0)
Monocytes Relative: 9 %
Neutro Abs: 3.8 10*3/uL (ref 1.7–7.7)
Neutrophils Relative %: 78 %
Platelet Count: 238 10*3/uL (ref 150–400)
RBC: 2.87 MIL/uL — ABNORMAL LOW (ref 3.87–5.11)
RDW: 14.4 % (ref 11.5–15.5)
WBC Count: 4.8 10*3/uL (ref 4.0–10.5)
nRBC: 0 % (ref 0.0–0.2)

## 2018-07-20 LAB — CMP (CANCER CENTER ONLY)
ALT: 13 U/L (ref 0–44)
AST: 31 U/L (ref 15–41)
Albumin: 3.3 g/dL — ABNORMAL LOW (ref 3.5–5.0)
Alkaline Phosphatase: 57 U/L (ref 38–126)
Anion gap: 6 (ref 5–15)
BUN: 18 mg/dL (ref 8–23)
CO2: 26 mmol/L (ref 22–32)
Calcium: 9 mg/dL (ref 8.9–10.3)
Chloride: 107 mmol/L (ref 98–111)
Creatinine: 1.09 mg/dL — ABNORMAL HIGH (ref 0.44–1.00)
GFR, Est AFR Am: 59 mL/min — ABNORMAL LOW (ref >60)
GFR, Estimated: 51 mL/min — ABNORMAL LOW (ref >60)
Glucose, Bld: 119 mg/dL — ABNORMAL HIGH (ref 70–99)
Potassium: 4.1 mmol/L (ref 3.5–5.1)
Sodium: 139 mmol/L (ref 135–145)
Total Bilirubin: 0.6 mg/dL (ref 0.3–1.2)
Total Protein: 6.7 g/dL (ref 6.5–8.1)

## 2018-07-20 MED ORDER — HEPARIN SOD (PORK) LOCK FLUSH 100 UNIT/ML IV SOLN
500.0000 [IU] | Freq: Once | INTRAVENOUS | Status: AC | PRN
  Administered 2018-07-20: 500 [IU]
  Filled 2018-07-20: qty 5

## 2018-07-20 MED ORDER — SODIUM CHLORIDE 0.9% FLUSH
10.0000 mL | Freq: Once | INTRAVENOUS | Status: AC
  Administered 2018-07-20: 10 mL
  Filled 2018-07-20: qty 10

## 2018-07-20 MED ORDER — SODIUM CHLORIDE 0.9% FLUSH
10.0000 mL | INTRAVENOUS | Status: DC | PRN
  Administered 2018-07-20: 10 mL
  Filled 2018-07-20: qty 10

## 2018-07-20 MED ORDER — SODIUM CHLORIDE 0.9 % IV SOLN
Freq: Once | INTRAVENOUS | Status: AC
  Administered 2018-07-20: 10:00:00 via INTRAVENOUS
  Filled 2018-07-20: qty 250

## 2018-07-20 MED ORDER — SODIUM CHLORIDE 0.9 % IV SOLN
525.0000 mg/m2 | Freq: Once | INTRAVENOUS | Status: AC
  Administered 2018-07-20: 1000 mg via INTRAVENOUS
  Filled 2018-07-20: qty 40

## 2018-07-20 MED ORDER — PROCHLORPERAZINE MALEATE 10 MG PO TABS
10.0000 mg | ORAL_TABLET | Freq: Once | ORAL | Status: AC
  Administered 2018-07-20: 10 mg via ORAL

## 2018-07-20 MED ORDER — PROCHLORPERAZINE MALEATE 10 MG PO TABS
ORAL_TABLET | ORAL | Status: AC
  Filled 2018-07-20: qty 1

## 2018-07-20 NOTE — Patient Instructions (Signed)
[facility] Discharge Instructions for Patients Receiving Chemotherapy  Today you received the following chemotherapy agents: Pemetrexed (Alimta)  To help prevent nausea and vomiting after your treatment, we encourage you to take your nausea medication as directed.    If you develop nausea and vomiting that is not controlled by your nausea medication, call the clinic.   BELOW ARE SYMPTOMS THAT SHOULD BE REPORTED IMMEDIATELY:  *FEVER GREATER THAN 100.5 F  *CHILLS WITH OR WITHOUT FEVER  NAUSEA AND VOMITING THAT IS NOT CONTROLLED WITH YOUR NAUSEA MEDICATION  *UNUSUAL SHORTNESS OF BREATH  *UNUSUAL BRUISING OR BLEEDING  TENDERNESS IN MOUTH AND THROAT WITH OR WITHOUT PRESENCE OF ULCERS  *URINARY PROBLEMS  *BOWEL PROBLEMS  UNUSUAL RASH Items with * indicate a potential emergency and should be followed up as soon as possible.  Feel free to call the clinic should you have any questions or concerns. The clinic phone number is [phone].  Please show the [facility] at check-in to the Emergency Department and triage nurse.

## 2018-07-20 NOTE — Telephone Encounter (Signed)
Printed avs and calender of upcoming appointment. Per 2/13 los 

## 2018-07-20 NOTE — Progress Notes (Signed)
[facility] [phone]   Fax:(336) [place], MD [address] [place] [address]  DIAGNOSIS: Stage IV (T1b, N2, M1b) non-small cell lung cancer, adenocarcinoma diagnosed in March 2017 and presented with right lower lobe lung nodule in addition to mediastinal lymphadenopathy and metastatic bone lesions.  Molecular studies: PDL 1 TPS  <1%.   Foundation One Studies: Positive for ERBB2 [id number]. Negative for EGFR, KRAS, ALK, BRAF, MET, RET and ROS1.  PRIOR THERAPY:  1) Induction systemic chemotherapy with carboplatin for AUC of 5 and Alimta 500 MG/M2 every 3 weeks is status post 6 cycles at the [facility], last dose was given 03/05/2016 with stable disease. 2) palliative radiation to the metastatic bone disease in the lower back and pelvic area.  CURRENT THERAPY::  1)  Maintenance systemic chemotherapy with Alimta 500 MG/M2 every 3 weeks status post 40 cycles. First dose was given 03/25/2016.  2) Zometa 4 mg IV every 12 week for metastatic bone disease.  INTERVAL HIST Valerie Long 73 y.o. female returns to the clinic today for follow-up visit accompanied by friend.  The patient is feeling fine today with no concerning complaints except for occasional left earache.  She denied having any chest pain, shortness of breath, cough or hemoptysis.  She denied having any fever or chills.  She has no nausea, vomiting, diarrhea or constipation.  She has no headache or visual changes.  She continues to tolerate her treatment with maintenance Alimta fairly well.  She is here for evaluation before starting cycle #41.  MEDICAL HISTORY: Past Medical History:  Diagnosis Date  . Adenocarcinoma of right lung, stage 4 ([place]) 2017  . Anemia   . Arthritis   . Bone metastases ([place]) 02/24/2017  . Encounter for antineoplastic chemotherapy 03/17/2016  . Hypercholesterolemia   . Hypertension 06/18/2016  .  Osteopenia   . 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 Pneumonia   . Shortness of breath dyspnea     ALLERGIES:  has No Known Allergies.  MEDICATIONS:  Current Outpatient Medications  Medication Sig Dispense Refill  . acetaminophen (TYLENOL) 500 MG tablet Take 1,000 mg by mouth every 6 (six) hours as needed for moderate pain or headache.    . baclofen (LIORESAL) 10 MG tablet Take 1 tablet (10 mg total) by mouth 3 (three) times daily. 30 each 0  . benzonatate (TESSALON) 100 MG capsule Take 1 capsule (100 mg total) by mouth 3 (three) times daily as needed for cough. (Patient not taking: Reported on 06/08/2018) 40 capsule 1  . Calcium Carb-Cholecalciferol (CALCIUM 600/VITAMIN D3 PO) Take by mouth.    . Cyanocobalamin (B-12 PO) Take 2 tablets by mouth daily.    Marland Kitchen dexamethasone (DECADRON) 4 MG tablet Take 52m by mouth twice daily the day before, of, and after chemo 40 tablet 1  . Fish Oil-Cholecalciferol (OMEGA-3 + VITAMIN D3 PO) Take 1 tablet by mouth daily.    . folic acid (FOLVITE) 1 MG tablet Take 1 tablet (1 mg total) by mouth daily. 90 tablet 1  . ketorolac (ACULAR) 0.5 % ophthalmic solution Place 1 drop into both eyes 4 (four) times daily.  0  . lidocaine-prilocaine (EMLA) cream Apply 1 application topically as needed. Apply 1-2 tsp over port site 1-2 hours prior to chemo. 30 g 0  . meloxicam (MOBIC) 15 MG tablet TAKE 1 TABLET BY MOUTH EVERY  DAY AS NEEDED FOR PAIN 30 tablet 0  . neomycin-polymyxin b-dexamethasone (MAXITROL) 3.5-10000-0.1 SUSP INSTILL 1 DROP BY OPHTHALMIC ROUTE 4 TIMES PER DAY INTO BOTH EYES.  1  . ofloxacin (OCUFLOX) 0.3 % ophthalmic solution Place 1 drop into both eyes 4 (four) times daily.  0  . ondansetron (ZOFRAN) 8 MG tablet Take 8 mg by mouth every 8 (eight) hours as needed.    Marland Kitchen OVER THE COUNTER MEDICATION Apply 1 drop topically daily as needed. 1 drop CBD oil in vicks vapor rub . Applied to right thigh for pain relief.    Marland Kitchen oxyCODONE-acetaminophen  (PERCOCET/ROXICET) 5-325 MG tablet Take 1 tablet by mouth every 8 (eight) hours as needed for severe pain. (Patient not taking: Reported on 06/08/2018) 30 tablet 0  . prednisoLONE acetate (PRED FORTE) 1 % ophthalmic suspension Place 1 drop into both eyes 4 (four) times daily.  0  . PRESCRIPTION MEDICATION Inject 1 Dose as directed every 21 ( twenty-one) days. Chemo    . prochlorperazine (COMPAZINE) 10 MG tablet Take 1 tablet (10 mg total) by mouth every 6 (six) hours as needed for nausea or vomiting. (Patient not taking: Reported on 06/08/2018) 30 tablet 0   No current facility-administered medications for this visit.     SURGICAL HISTORY:  Past Surgical History:  Procedure Laterality Date  . CESAREAN SECTION     myomectomy  . COLONOSCOPY W/ POLYPECTOMY    . IR GENERIC HISTORICAL  08/17/2016   IR US GUIDE VASC ACCESS RIGHT 08/17/2016 [name], MD [facility]  . IR GENERIC HISTORICAL  08/17/2016   IR FLUORO GUIDE PORT INSERTION RIGHT 08/17/2016 [name], MD [facility]  . MEDIASTINOSCOPY N/A 09/25/2015   Procedure: MEDIASTINOSCOPY;  Surgeon: Melrose Nakayama, MD;  Location: [facility];  Service: Thoracic;  Laterality: N/A;  . VIDEO BRONCHOSCOPY Bilateral 08/19/2015   Procedure: VIDEO BRONCHOSCOPY WITH FLUORO;  Surgeon: Rigoberto Noel, MD;  Location: [facility];  Service: Cardiopulmonary;  Laterality: Bilateral;  . VIDEO BRONCHOSCOPY N/A 09/08/2015   Procedure: VIDEO BRONCHOSCOPY WITH FLUORO;  Surgeon: Rigoberto Noel, MD;  Location: [facility];  Service: Thoracic;  Laterality: N/A;  . VIDEO BRONCHOSCOPY WITH ENDOBRONCHIAL ULTRASOUND Right 09/08/2015   Procedure: ATTEMPTED VIDEO BRONCHOSCOPY ENDOBRONCHIAL ULTRASOUND  ;  Surgeon: Rigoberto Noel, MD;  Location: [facility];  Service: Thoracic;  Laterality: Right;    REVIEW OF SYSTEMS:  A comprehensive review of systems was negative except for: Constitutional: positive for fatigue Ears, nose, mouth, throat, and face: positive for  earaches Musculoskeletal: positive for arthralgias   PHYSICAL EXAMINATION: General appearance: alert, cooperative, fatigued and no distress Head: Normocephalic, without obvious abnormality, atraumatic Neck: no adenopathy, no JVD, supple, symmetrical, trachea midline and thyroid not enlarged, symmetric, no tenderness/mass/nodules Lymph nodes: Cervical, supraclavicular, and axillary nodes normal. Resp: clear to auscultation bilaterally Back: symmetric, no curvature. ROM normal. No CVA tenderness. Cardio: regular rate and rhythm, S1, S2 normal, no murmur, click, rub or gallop GI: soft, non-tender; bowel sounds normal; no masses,  no organomegaly Extremities: extremities normal, atraumatic, no cyanosis or edema  ECOG PERFORMANCE STATUS: 1 - Symptomatic but completely ambulatory  Blood pressure 138/73, pulse 85, temperature 97.8 F (36.6 C), temperature source Oral, resp. rate 18, height '5\' 9"'  (1.753 m), weight 151 lb 4.8 oz (68.6 kg), last menstrual period 06/09/1998, SpO2 100 %.  LABORATORY DATA: Lab Results  Component Value Date   WBC 5.3 06/29/2018   HGB 9.8 (L) 06/29/2018   HCT 29.4 (L) 06/29/2018  MCV 102.4 (H) 06/29/2018   PLT 267 06/29/2018      Chemistry      Component Value Date/Time   NA 139 06/29/2018 0840   NA 137 06/09/2017 0901   K 4.1 06/29/2018 0840   K 3.9 06/09/2017 0901   CL 103 06/29/2018 0840   CO2 27 06/29/2018 0840   CO2 25 06/09/2017 0901   BUN 21 06/29/2018 0840   BUN 19.0 06/09/2017 0901   CREATININE 1.02 (H) 06/29/2018 0840   CREATININE 1.2 (H) 06/09/2017 0901   GLU 170 01/23/2016      Component Value Date/Time   CALCIUM 10.1 06/29/2018 0840   CALCIUM 9.9 06/09/2017 0901   ALKPHOS 67 06/29/2018 0840   ALKPHOS 63 06/09/2017 0901   AST 27 06/29/2018 0840   AST 35 (H) 06/09/2017 0901   ALT 13 06/29/2018 0840   ALT 23 06/09/2017 0901   BILITOT 0.5 06/29/2018 0840   BILITOT 0.65 06/09/2017 0901       RADIOGRAPHIC STUDIES: No results  found.  ASSESSMENT AND PLAN:  This is a very pleasant 73 years old Hispanic female with metastatic non-small cell lung cancer, adenocarcinoma status post induction systemic chemotherapy with carboplatin and Alimta and she is currently on maintenance treatment with single agent Alimta status post 40 cycles. She has been tolerating this treatment well. I recommended for her to proceed with cycle #41 today as scheduled. I will see the patient back for follow-up visit in 3 weeks for evaluation before starting cycle #42. She was advised to call immediately if she has any concerning symptoms in the interval. The patient voices understanding of current disease status and treatment options and is in agreement with the current care plan. All questions were answered. The patient knows to call the clinic with any problems, questions or concerns. We can certainly see the patient much sooner if necessary.  Disclaimer: This note was dictated with voice recognition software. Similar sounding words can inadvertently be transcribed and may not be corrected upon review.

## 2018-07-27 ENCOUNTER — Telehealth: Payer: Self-pay | Admitting: *Deleted

## 2018-07-27 NOTE — Telephone Encounter (Signed)
Received vm message from patient requesting to know her blood type. TCT patient and spoke with her. Informed pt that she is O+. Pt appreciative of call back. No further questions or concerns

## 2018-08-08 ENCOUNTER — Other Ambulatory Visit: Payer: Self-pay | Admitting: Medical Oncology

## 2018-08-08 DIAGNOSIS — C3491 Malignant neoplasm of unspecified part of right bronchus or lung: Secondary | ICD-10-CM

## 2018-08-09 ENCOUNTER — Telehealth: Payer: Self-pay | Admitting: Internal Medicine

## 2018-08-09 ENCOUNTER — Other Ambulatory Visit: Payer: Self-pay

## 2018-08-09 ENCOUNTER — Inpatient Hospital Stay: Payer: Medicare Other | Attending: Internal Medicine

## 2018-08-09 ENCOUNTER — Inpatient Hospital Stay: Payer: Medicare Other

## 2018-08-09 ENCOUNTER — Encounter: Payer: Self-pay | Admitting: Internal Medicine

## 2018-08-09 ENCOUNTER — Inpatient Hospital Stay (HOSPITAL_BASED_OUTPATIENT_CLINIC_OR_DEPARTMENT_OTHER): Payer: Medicare Other | Admitting: Internal Medicine

## 2018-08-09 VITALS — BP 136/75 | HR 86 | Temp 98.2°F | Resp 18 | Ht 69.0 in | Wt 148.1 lb

## 2018-08-09 DIAGNOSIS — C3431 Malignant neoplasm of lower lobe, right bronchus or lung: Secondary | ICD-10-CM | POA: Diagnosis not present

## 2018-08-09 DIAGNOSIS — C3491 Malignant neoplasm of unspecified part of right bronchus or lung: Secondary | ICD-10-CM

## 2018-08-09 DIAGNOSIS — C7951 Secondary malignant neoplasm of bone: Secondary | ICD-10-CM

## 2018-08-09 DIAGNOSIS — Z5111 Encounter for antineoplastic chemotherapy: Secondary | ICD-10-CM | POA: Insufficient documentation

## 2018-08-09 DIAGNOSIS — Z95828 Presence of other vascular implants and grafts: Secondary | ICD-10-CM

## 2018-08-09 DIAGNOSIS — R5383 Other fatigue: Secondary | ICD-10-CM | POA: Diagnosis not present

## 2018-08-09 DIAGNOSIS — G8929 Other chronic pain: Secondary | ICD-10-CM | POA: Diagnosis not present

## 2018-08-09 DIAGNOSIS — R05 Cough: Secondary | ICD-10-CM

## 2018-08-09 DIAGNOSIS — C3490 Malignant neoplasm of unspecified part of unspecified bronchus or lung: Secondary | ICD-10-CM

## 2018-08-09 DIAGNOSIS — M545 Low back pain: Secondary | ICD-10-CM

## 2018-08-09 LAB — CBC WITH DIFFERENTIAL (CANCER CENTER ONLY)
Abs Immature Granulocytes: 0.03 10*3/uL (ref 0.00–0.07)
Basophils Absolute: 0 10*3/uL (ref 0.0–0.1)
Basophils Relative: 0 %
Eosinophils Absolute: 0 10*3/uL (ref 0.0–0.5)
Eosinophils Relative: 0 %
HCT: 30.1 % — ABNORMAL LOW (ref 36.0–46.0)
Hemoglobin: 9.8 g/dL — ABNORMAL LOW (ref 12.0–15.0)
Immature Granulocytes: 1 %
Lymphocytes Relative: 15 %
Lymphs Abs: 0.7 10*3/uL (ref 0.7–4.0)
MCH: 33.2 pg (ref 26.0–34.0)
MCHC: 32.6 g/dL (ref 30.0–36.0)
MCV: 102 fL — ABNORMAL HIGH (ref 80.0–100.0)
Monocytes Absolute: 0.4 10*3/uL (ref 0.1–1.0)
Monocytes Relative: 9 %
Neutro Abs: 3.7 10*3/uL (ref 1.7–7.7)
Neutrophils Relative %: 75 %
Platelet Count: 299 10*3/uL (ref 150–400)
RBC: 2.95 MIL/uL — ABNORMAL LOW (ref 3.87–5.11)
RDW: 14.4 % (ref 11.5–15.5)
WBC Count: 4.9 10*3/uL (ref 4.0–10.5)
nRBC: 0 % (ref 0.0–0.2)

## 2018-08-09 LAB — CMP (CANCER CENTER ONLY)
ALT: 13 U/L (ref 0–44)
AST: 31 U/L (ref 15–41)
Albumin: 3.2 g/dL — ABNORMAL LOW (ref 3.5–5.0)
Alkaline Phosphatase: 67 U/L (ref 38–126)
Anion gap: 8 (ref 5–15)
BUN: 17 mg/dL (ref 8–23)
CO2: 25 mmol/L (ref 22–32)
Calcium: 9 mg/dL (ref 8.9–10.3)
Chloride: 107 mmol/L (ref 98–111)
Creatinine: 1.14 mg/dL — ABNORMAL HIGH (ref 0.44–1.00)
GFR, Est AFR Am: 56 mL/min — ABNORMAL LOW (ref >60)
GFR, Estimated: 48 mL/min — ABNORMAL LOW (ref >60)
Glucose, Bld: 137 mg/dL — ABNORMAL HIGH (ref 70–99)
Potassium: 3.9 mmol/L (ref 3.5–5.1)
Sodium: 140 mmol/L (ref 135–145)
Total Bilirubin: 0.4 mg/dL (ref 0.3–1.2)
Total Protein: 7 g/dL (ref 6.5–8.1)

## 2018-08-09 MED ORDER — SODIUM CHLORIDE 0.9 % IV SOLN
Freq: Once | INTRAVENOUS | Status: AC
  Administered 2018-08-09: 09:00:00 via INTRAVENOUS
  Filled 2018-08-09: qty 250

## 2018-08-09 MED ORDER — PROCHLORPERAZINE MALEATE 10 MG PO TABS
ORAL_TABLET | ORAL | Status: AC
  Filled 2018-08-09: qty 1

## 2018-08-09 MED ORDER — SODIUM CHLORIDE 0.9% FLUSH
10.0000 mL | Freq: Once | INTRAVENOUS | Status: AC
  Administered 2018-08-09: 10 mL
  Filled 2018-08-09: qty 10

## 2018-08-09 MED ORDER — PROCHLORPERAZINE MALEATE 10 MG PO TABS
10.0000 mg | ORAL_TABLET | Freq: Once | ORAL | Status: AC
  Administered 2018-08-09: 10 mg via ORAL

## 2018-08-09 MED ORDER — SODIUM CHLORIDE 0.9 % IV SOLN
900.0000 mg | Freq: Once | INTRAVENOUS | Status: AC
  Administered 2018-08-09: 900 mg via INTRAVENOUS
  Filled 2018-08-09: qty 20

## 2018-08-09 MED ORDER — HEPARIN SOD (PORK) LOCK FLUSH 100 UNIT/ML IV SOLN
500.0000 [IU] | Freq: Once | INTRAVENOUS | Status: AC | PRN
  Administered 2018-08-09: 500 [IU]
  Filled 2018-08-09: qty 5

## 2018-08-09 MED ORDER — SODIUM CHLORIDE 0.9% FLUSH
10.0000 mL | INTRAVENOUS | Status: DC | PRN
  Administered 2018-08-09: 10 mL
  Filled 2018-08-09: qty 10

## 2018-08-09 MED ORDER — BENZONATATE 100 MG PO CAPS: 100.0000 mg | ORAL_CAPSULE | Freq: Three times a day (TID) | ORAL | 1 refills | Status: DC | PRN

## 2018-08-09 NOTE — Progress Notes (Signed)
[facility] [phone]   Fax:(336) [place], MD [address] [address]  DIAGNOSIS: Stage IV (T1b, N2, M1b) non-small cell lung cancer, adenocarcinoma diagnosed in March 2017 and presented with right lower lobe lung nodule in addition to mediastinal lymphadenopathy and metastatic bone lesions.  Molecular studies: PDL 1 TPS  <1%.   Foundation One Studies: Positive for ERBB2 [id number]. Negative for EGFR, KRAS, ALK, BRAF, MET, RET and ROS1.  PRIOR THERAPY:  1) Induction systemic chemotherapy with carboplatin for AUC of 5 and Alimta 500 MG/M2 every 3 weeks is status post 6 cycles at the [facility], last dose was given 03/05/2016 with stable disease. 2) palliative radiation to the metastatic bone disease in the lower back and pelvic area.  CURRENT THERAPY::  1)  Maintenance systemic chemotherapy with Alimta 500 MG/M2 every 3 weeks status post 41 cycles. First dose was given 03/25/2016.  2) Zometa 4 mg IV every 12 week for metastatic bone disease.  INTERVAL HIST Kyrsten Deleeuw 73 y.o. female returns to the clinic today for follow-up visit.  The patient is feeling fine today with no concerning complaints except for the chronic low back pain more to the left side.  She has no chest pain, shortness of breath but continues to have cough with no hemoptysis.  She has no recent weight loss or night sweats.  She has no nausea, vomiting, diarrhea or constipation.  She continues to tolerate her treatment with Alimta fairly well.  She is here today for evaluation before starting cycle #42.   MEDICAL HISTORY: Past Medical History:  Diagnosis Date  . Adenocarcinoma of right lung, stage 4 ([place]) 2017  . Anemia   . Arthritis   . Bone metastases ([place]) 02/24/2017  . Encounter for antineoplastic chemotherapy 03/17/2016  . Hypercholesterolemia   . Hypertension 06/18/2016  . Osteopenia   . Pelvic  kidney    Left. On CT in [place]  . Pneumonia   . Shortness of breath dyspnea     ALLERGIES:  has No Known Allergies.  MEDICATIONS:  Current Outpatient Medications  Medication Sig Dispense Refill  . acetaminophen (TYLENOL) 500 MG tablet Take 1,000 mg by mouth every 6 (six) hours as needed for moderate pain or headache.    . baclofen (LIORESAL) 10 MG tablet Take 1 tablet (10 mg total) by mouth 3 (three) times daily. 30 each 0  . benzonatate (TESSALON) 100 MG capsule Take 1 capsule (100 mg total) by mouth 3 (three) times daily as needed for cough. (Patient not taking: Reported on 06/08/2018) 40 capsule 1  . Calcium Carb-Cholecalciferol (CALCIUM 600/VITAMIN D3 PO) Take by mouth.    . Cyanocobalamin (B-12 PO) Take 2 tablets by mouth daily.    Marland Kitchen dexamethasone (DECADRON) 4 MG tablet Take 30m by mouth twice daily the day before, of, and after chemo 40 tablet 1  . Fish Oil-Cholecalciferol (OMEGA-3 + VITAMIN D3 PO) Take 1 tablet by mouth daily.    . folic acid (FOLVITE) 1 MG tablet Take 1 tablet (1 mg total) by mouth daily. 90 tablet 1  . ketorolac (ACULAR) 0.5 % ophthalmic solution Place 1 drop into both eyes 4 (four) times daily.  0  . lidocaine-prilocaine (EMLA) cream Apply 1 application topically as needed. Apply 1-2 tsp over port site 1-2 hours prior to chemo. 30 g 0  . meloxicam (MOBIC) 15 MG tablet TAKE 1 TABLET  BY MOUTH EVERY DAY AS NEEDED FOR PAIN 30 tablet 0  . neomycin-polymyxin b-dexamethasone (MAXITROL) 3.5-10000-0.1 SUSP INSTILL 1 DROP BY OPHTHALMIC ROUTE 4 TIMES PER DAY INTO BOTH EYES.  1  . ofloxacin (OCUFLOX) 0.3 % ophthalmic solution Place 1 drop into both eyes 4 (four) times daily.  0  . ondansetron (ZOFRAN) 8 MG tablet Take 8 mg by mouth every 8 (eight) hours as needed.    Marland Kitchen OVER THE COUNTER MEDICATION Apply 1 drop topically daily as needed. 1 drop CBD oil in vicks vapor rub . Applied to right thigh for pain relief.    Marland Kitchen oxyCODONE-acetaminophen (PERCOCET/ROXICET) 5-325  MG tablet Take 1 tablet by mouth every 8 (eight) hours as needed for severe pain. (Patient not taking: Reported on 06/08/2018) 30 tablet 0  . prednisoLONE acetate (PRED FORTE) 1 % ophthalmic suspension Place 1 drop into both eyes 4 (four) times daily.  0  . PRESCRIPTION MEDICATION Inject 1 Dose as directed every 21 ( twenty-one) days. Chemo    . prochlorperazine (COMPAZINE) 10 MG tablet Take 1 tablet (10 mg total) by mouth every 6 (six) hours as needed for nausea or vomiting. (Patient not taking: Reported on 06/08/2018) 30 tablet 0   No current facility-administered medications for this visit.     SURGICAL HISTORY:  Past Surgical History:  Procedure Laterality Date  . CESAREAN SECTION     myomectomy  . COLONOSCOPY W/ POLYPECTOMY    . IR GENERIC HISTORICAL  08/17/2016   IR US GUIDE VASC ACCESS RIGHT 08/17/2016 [name], MD [facility]  . IR GENERIC HISTORICAL  08/17/2016   IR FLUORO GUIDE PORT INSERTION RIGHT 08/17/2016 [name], MD [facility]  . MEDIASTINOSCOPY N/A 09/25/2015   Procedure: MEDIASTINOSCOPY;  Surgeon: Melrose Nakayama, MD;  Location: [facility];  Service: Thoracic;  Laterality: N/A;  . VIDEO BRONCHOSCOPY Bilateral 08/19/2015   Procedure: VIDEO BRONCHOSCOPY WITH FLUORO;  Surgeon: Rigoberto Noel, MD;  Location: [facility];  Service: Cardiopulmonary;  Laterality: Bilateral;  . VIDEO BRONCHOSCOPY N/A 09/08/2015   Procedure: VIDEO BRONCHOSCOPY WITH FLUORO;  Surgeon: Rigoberto Noel, MD;  Location: [facility];  Service: Thoracic;  Laterality: N/A;  . VIDEO BRONCHOSCOPY WITH ENDOBRONCHIAL ULTRASOUND Right 09/08/2015   Procedure: ATTEMPTED VIDEO BRONCHOSCOPY ENDOBRONCHIAL ULTRASOUND  ;  Surgeon: Rigoberto Noel, MD;  Location: [facility];  Service: Thoracic;  Laterality: Right;    REVIEW OF SYSTEMS:  A comprehensive review of systems was negative except for: Constitutional: positive for fatigue Respiratory: positive for cough Musculoskeletal: positive for arthralgias   PHYSICAL  EXAMINATION: General appearance: alert, cooperative, fatigued and no distress Head: Normocephalic, without obvious abnormality, atraumatic Neck: no adenopathy, no JVD, supple, symmetrical, trachea midline and thyroid not enlarged, symmetric, no tenderness/mass/nodules Lymph nodes: Cervical, supraclavicular, and axillary nodes normal. Resp: clear to auscultation bilaterally Back: symmetric, no curvature. ROM normal. No CVA tenderness. Cardio: regular rate and rhythm, S1, S2 normal, no murmur, click, rub or gallop GI: soft, non-tender; bowel sounds normal; no masses,  no organomegaly Extremities: extremities normal, atraumatic, no cyanosis or edema  ECOG PERFORMANCE STATUS: 1 - Symptomatic but completely ambulatory  Blood pressure 136/75, pulse 86, temperature 98.2 F (36.8 C), temperature source Oral, resp. rate 18, height '5\' 9"'  (1.753 m), weight 148 lb 1.6 oz (67.2 kg), last menstrual period 06/09/1998, SpO2 100 %.  LABORATORY DATA: Lab Results  Component Value Date   WBC 4.9 08/09/2018   HGB 9.8 (L) 08/09/2018   HCT 30.1 (L) 08/09/2018   MCV  102.0 (H) 08/09/2018   PLT 299 08/09/2018      Chemistry      Component Value Date/Time   NA 139 07/20/2018 0810   NA 137 06/09/2017 0901   K 4.1 07/20/2018 0810   K 3.9 06/09/2017 0901   CL 107 07/20/2018 0810   CO2 26 07/20/2018 0810   CO2 25 06/09/2017 0901   BUN 18 07/20/2018 0810   BUN 19.0 06/09/2017 0901   CREATININE 1.09 (H) 07/20/2018 0810   CREATININE 1.2 (H) 06/09/2017 0901   GLU 170 01/23/2016      Component Value Date/Time   CALCIUM 9.0 07/20/2018 0810   CALCIUM 9.9 06/09/2017 0901   ALKPHOS 57 07/20/2018 0810   ALKPHOS 63 06/09/2017 0901   AST 31 07/20/2018 0810   AST 35 (H) 06/09/2017 0901   ALT 13 07/20/2018 0810   ALT 23 06/09/2017 0901   BILITOT 0.6 07/20/2018 0810   BILITOT 0.65 06/09/2017 0901       RADIOGRAPHIC STUDIES: No results found.  ASSESSMENT AND PLAN:  This is a very pleasant 73 years old  Hispanic female with metastatic non-small cell lung cancer, adenocarcinoma status post induction systemic chemotherapy with carboplatin and Alimta and she is currently on maintenance treatment with single agent Alimta status post 41 cycles. The patient continues to tolerate her treatment well with no concerning adverse effects except for fatigue. I recommended for her to proceed with cycle #42 today as scheduled. I will see her back for follow-up visit in 3 weeks for evaluation after repeating CT scan of the chest, abdomen and pelvis for restaging of her disease. For the dry cough I will give her a refill of Tessalon. The patient was advised to call immediately if she has any concerning symptoms in the interval. The patient voices understanding of current disease status and treatment options and is in agreement with the current care plan. All questions were answered. The patient knows to call the clinic with any problems, questions or concerns. We can certainly see the patient much sooner if necessary.  Disclaimer: This note was dictated with voice recognition software. Similar sounding words can inadvertently be transcribed and may not be corrected upon review.

## 2018-08-09 NOTE — Patient Instructions (Signed)
[facility] Discharge Instructions for Patients Receiving Chemotherapy  Today you received the following chemotherapy agents: Pemetrexed (Alimta)  To help prevent nausea and vomiting after your treatment, we encourage you to take your nausea medication as directed.    If you develop nausea and vomiting that is not controlled by your nausea medication, call the clinic.   BELOW ARE SYMPTOMS THAT SHOULD BE REPORTED IMMEDIATELY:  *FEVER GREATER THAN 100.5 F  *CHILLS WITH OR WITHOUT FEVER  NAUSEA AND VOMITING THAT IS NOT CONTROLLED WITH YOUR NAUSEA MEDICATION  *UNUSUAL SHORTNESS OF BREATH  *UNUSUAL BRUISING OR BLEEDING  TENDERNESS IN MOUTH AND THROAT WITH OR WITHOUT PRESENCE OF ULCERS  *URINARY PROBLEMS  *BOWEL PROBLEMS  UNUSUAL RASH Items with * indicate a potential emergency and should be followed up as soon as possible.  Feel free to call the clinic should you have any questions or concerns. The clinic phone number is [phone].  Please show the [facility] at check-in to the Emergency Department and triage nurse.

## 2018-08-09 NOTE — Telephone Encounter (Signed)
Scheduled appt per 3/4 los - pt to get an updated schedule next visit.

## 2018-08-28 ENCOUNTER — Other Ambulatory Visit: Payer: Self-pay

## 2018-08-28 ENCOUNTER — Ambulatory Visit (HOSPITAL_COMMUNITY)
Admission: RE | Admit: 2018-08-28 | Discharge: 2018-08-28 | Disposition: A | Discharge status: Home / Self Care | Payer: Medicare Other | Source: Ambulatory Visit | Attending: Internal Medicine | Admitting: Internal Medicine

## 2018-08-28 ENCOUNTER — Telehealth: Payer: Self-pay | Admitting: Medical Oncology

## 2018-08-28 DIAGNOSIS — C3431 Malignant neoplasm of lower lobe, right bronchus or lung: Secondary | ICD-10-CM | POA: Diagnosis not present

## 2018-08-28 DIAGNOSIS — C3490 Malignant neoplasm of unspecified part of unspecified bronchus or lung: Secondary | ICD-10-CM | POA: Insufficient documentation

## 2018-08-28 MED ORDER — IOHEXOL 300 MG/ML  SOLN
100.0000 mL | Freq: Once | INTRAMUSCULAR | Status: AC | PRN
  Administered 2018-08-28: 100 mL via INTRAVENOUS

## 2018-08-28 MED ORDER — SODIUM CHLORIDE (PF) 0.9 % IJ SOLN
INTRAMUSCULAR | Status: AC
  Filled 2018-08-28: qty 50

## 2018-08-28 NOTE — Telephone Encounter (Signed)
Pt notified to keep appts.

## 2018-08-29 ENCOUNTER — Other Ambulatory Visit: Payer: Self-pay | Admitting: Physician Assistant

## 2018-08-29 DIAGNOSIS — C3491 Malignant neoplasm of unspecified part of right bronchus or lung: Secondary | ICD-10-CM

## 2018-08-30 ENCOUNTER — Other Ambulatory Visit: Payer: Self-pay

## 2018-08-30 ENCOUNTER — Encounter: Payer: Self-pay | Admitting: Internal Medicine

## 2018-08-30 ENCOUNTER — Inpatient Hospital Stay: Payer: Medicare Other

## 2018-08-30 ENCOUNTER — Inpatient Hospital Stay (HOSPITAL_BASED_OUTPATIENT_CLINIC_OR_DEPARTMENT_OTHER): Payer: Medicare Other | Admitting: Internal Medicine

## 2018-08-30 VITALS — BP 149/70 | HR 86 | Temp 97.9°F | Resp 18 | Ht 69.0 in | Wt 147.4 lb

## 2018-08-30 DIAGNOSIS — D649 Anemia, unspecified: Secondary | ICD-10-CM | POA: Diagnosis not present

## 2018-08-30 DIAGNOSIS — C3491 Malignant neoplasm of unspecified part of right bronchus or lung: Secondary | ICD-10-CM

## 2018-08-30 DIAGNOSIS — M545 Low back pain: Secondary | ICD-10-CM | POA: Diagnosis not present

## 2018-08-30 DIAGNOSIS — C3431 Malignant neoplasm of lower lobe, right bronchus or lung: Secondary | ICD-10-CM | POA: Diagnosis not present

## 2018-08-30 DIAGNOSIS — C7951 Secondary malignant neoplasm of bone: Secondary | ICD-10-CM

## 2018-08-30 DIAGNOSIS — R5383 Other fatigue: Secondary | ICD-10-CM | POA: Diagnosis not present

## 2018-08-30 DIAGNOSIS — Z95828 Presence of other vascular implants and grafts: Secondary | ICD-10-CM

## 2018-08-30 DIAGNOSIS — Z5111 Encounter for antineoplastic chemotherapy: Secondary | ICD-10-CM

## 2018-08-30 LAB — CBC WITH DIFFERENTIAL (CANCER CENTER ONLY)
Abs Immature Granulocytes: 0.02 10*3/uL (ref 0.00–0.07)
Basophils Absolute: 0 10*3/uL (ref 0.0–0.1)
Basophils Relative: 0 %
Eosinophils Absolute: 0 10*3/uL (ref 0.0–0.5)
Eosinophils Relative: 0 %
HCT: 30.8 % — ABNORMAL LOW (ref 36.0–46.0)
Hemoglobin: 10.1 g/dL — ABNORMAL LOW (ref 12.0–15.0)
Immature Granulocytes: 0 %
Lymphocytes Relative: 13 %
Lymphs Abs: 0.6 10*3/uL — ABNORMAL LOW (ref 0.7–4.0)
MCH: 33.9 pg (ref 26.0–34.0)
MCHC: 32.8 g/dL (ref 30.0–36.0)
MCV: 103.4 fL — ABNORMAL HIGH (ref 80.0–100.0)
Monocytes Absolute: 0.5 10*3/uL (ref 0.1–1.0)
Monocytes Relative: 10 %
Neutro Abs: 3.7 10*3/uL (ref 1.7–7.7)
Neutrophils Relative %: 77 %
Platelet Count: 294 10*3/uL (ref 150–400)
RBC: 2.98 MIL/uL — ABNORMAL LOW (ref 3.87–5.11)
RDW: 14.7 % (ref 11.5–15.5)
WBC Count: 4.8 10*3/uL (ref 4.0–10.5)
nRBC: 0 % (ref 0.0–0.2)

## 2018-08-30 LAB — CMP (CANCER CENTER ONLY)
ALT: 18 U/L (ref 0–44)
AST: 33 U/L (ref 15–41)
Albumin: 3.3 g/dL — ABNORMAL LOW (ref 3.5–5.0)
Alkaline Phosphatase: 67 U/L (ref 38–126)
Anion gap: 9 (ref 5–15)
BUN: 18 mg/dL (ref 8–23)
CO2: 25 mmol/L (ref 22–32)
Calcium: 9.5 mg/dL (ref 8.9–10.3)
Chloride: 104 mmol/L (ref 98–111)
Creatinine: 1.19 mg/dL — ABNORMAL HIGH (ref 0.44–1.00)
GFR, Est AFR Am: 53 mL/min — ABNORMAL LOW (ref >60)
GFR, Estimated: 46 mL/min — ABNORMAL LOW (ref >60)
Glucose, Bld: 161 mg/dL — ABNORMAL HIGH (ref 70–99)
Potassium: 3.8 mmol/L (ref 3.5–5.1)
Sodium: 138 mmol/L (ref 135–145)
Total Bilirubin: 0.6 mg/dL (ref 0.3–1.2)
Total Protein: 7.1 g/dL (ref 6.5–8.1)

## 2018-08-30 MED ORDER — SODIUM CHLORIDE 0.9% FLUSH
10.0000 mL | INTRAVENOUS | Status: DC | PRN
  Administered 2018-08-30: 10 mL
  Filled 2018-08-30: qty 10

## 2018-08-30 MED ORDER — CYANOCOBALAMIN 1000 MCG/ML IJ SOLN
INTRAMUSCULAR | Status: AC
  Filled 2018-08-30: qty 1

## 2018-08-30 MED ORDER — CYANOCOBALAMIN 1000 MCG/ML IJ SOLN
1000.0000 ug | Freq: Once | INTRAMUSCULAR | Status: AC
  Administered 2018-08-30: 1000 ug via INTRAMUSCULAR

## 2018-08-30 MED ORDER — HEPARIN SOD (PORK) LOCK FLUSH 100 UNIT/ML IV SOLN
500.0000 [IU] | Freq: Once | INTRAVENOUS | Status: AC | PRN
  Administered 2018-08-30: 500 [IU]
  Filled 2018-08-30: qty 5

## 2018-08-30 MED ORDER — PROCHLORPERAZINE MALEATE 10 MG PO TABS
ORAL_TABLET | ORAL | Status: AC
  Filled 2018-08-30: qty 1

## 2018-08-30 MED ORDER — SODIUM CHLORIDE 0.9% FLUSH
10.0000 mL | Freq: Once | INTRAVENOUS | Status: AC
  Administered 2018-08-30: 10 mL
  Filled 2018-08-30: qty 10

## 2018-08-30 MED ORDER — SODIUM CHLORIDE 0.9 % IV SOLN
Freq: Once | INTRAVENOUS | Status: AC
  Administered 2018-08-30: 09:00:00 via INTRAVENOUS
  Filled 2018-08-30: qty 250

## 2018-08-30 MED ORDER — PROCHLORPERAZINE MALEATE 10 MG PO TABS
10.0000 mg | ORAL_TABLET | Freq: Once | ORAL | Status: AC
  Administered 2018-08-30: 10 mg via ORAL

## 2018-08-30 MED ORDER — SODIUM CHLORIDE 0.9 % IV SOLN
470.0000 mg/m2 | Freq: Once | INTRAVENOUS | Status: AC
  Administered 2018-08-30: 900 mg via INTRAVENOUS
  Filled 2018-08-30: qty 20

## 2018-08-30 NOTE — Progress Notes (Signed)
[facility] [phone]   Fax:(336) [place], MD [address] [place] [address]  DIAGNOSIS: Stage IV (T1b, N2, M1b) non-small cell lung cancer, adenocarcinoma diagnosed in March 2017 and presented with right lower lobe lung nodule in addition to mediastinal lymphadenopathy and metastatic bone lesions.  Molecular studies: PDL 1 TPS  <1%.   [facility] One Studies: Positive for ERBB2 [id number]. Negative for EGFR, KRAS, ALK, BRAF, MET, RET and ROS1.  PRIOR THERAPY:  1) Induction systemic chemotherapy with carboplatin for AUC of 5 and Alimta 500 MG/M2 every 3 weeks is status post 6 cycles at the [facility], last dose was given 03/05/2016 with stable disease. 2) palliative radiation to the metastatic bone disease in the lower back and pelvic area.  CURRENT THERAPY::  1)  Maintenance systemic chemotherapy with Alimta 500 MG/M2 every 3 weeks status post 42 cycles. First dose was given 03/25/2016.  2) Zometa 4 mg IV every 12 week for metastatic bone disease.  INTERVAL HIST Valerie Long 73 y.o. female returns to the clinic today for follow-up visit.  The patient is feeling fine today with no concerning complaints except for the low back pain and mild fatigue.  She has been tolerating her maintenance treatment with Alimta fairly well.  She denied having any chest pain, shortness of breath, cough or hemoptysis.  She denied having any fever or chills.  She has no nausea, vomiting, diarrhea or constipation.  She denied having any headache or visual changes.  The patient had repeat CT scan of the chest, abdomen and pelvis performed recently and she is here for evaluation and discussion of her scan results.   MEDICAL HISTORY: Past Medical History:  Diagnosis Date   Adenocarcinoma of right lung, stage 4 ([place]) 2017   Anemia    Arthritis    Bone metastases ([place]) 02/24/2017   Encounter for  antineoplastic chemotherapy 03/17/2016   Hypercholesterolemia    Hypertension 06/18/2016   Osteopenia    Pelvic kidney    Left. On [place] in [place]   Pneumonia    Shortness of breath dyspnea     ALLERGIES:  has No Known Allergies.  MEDICATIONS:  Current Outpatient Medications  Medication Sig Dispense Refill   acetaminophen (TYLENOL) 500 MG tablet Take 1,000 mg by mouth every 6 (six) hours as needed for moderate pain or headache.     baclofen (LIORESAL) 10 MG tablet Take 1 tablet (10 mg total) by mouth 3 (three) times daily. 30 each 0   benzonatate (TESSALON) 100 MG capsule Take 1 capsule (100 mg total) by mouth 3 (three) times daily as needed for cough. 40 capsule 1   Calcium Carb-Cholecalciferol (CALCIUM 600/VITAMIN D3 PO) Take by mouth.     Cyanocobalamin (B-12 PO) Take 2 tablets by mouth daily.     dexamethasone (DECADRON) 4 MG tablet Take 42m by mouth twice daily the day before, of, and after chemo 40 tablet 1   Fish Oil-Cholecalciferol (OMEGA-3 + VITAMIN D3 PO) Take 1 tablet by mouth daily.     folic acid (FOLVITE) 1 MG tablet Take 1 tablet (1 mg total) by mouth daily. 90 tablet 1   ketorolac (ACULAR) 0.5 % ophthalmic solution Place 1 drop into both eyes 4 (four) times daily.  0   lidocaine-prilocaine (EMLA) cream Apply 1 application topically as needed. Apply 1-2 tsp over port site 1-2 hours prior to chemo. 30 g  0   meloxicam (MOBIC) 15 MG tablet TAKE 1 TABLET BY MOUTH EVERY DAY AS NEEDED FOR PAIN 30 tablet 0   neomycin-polymyxin b-dexamethasone (MAXITROL) 3.5-10000-0.1 SUSP INSTILL 1 DROP BY OPHTHALMIC ROUTE 4 TIMES PER DAY INTO BOTH EYES.  1   ofloxacin (OCUFLOX) 0.3 % ophthalmic solution Place 1 drop into both eyes 4 (four) times daily.  0   ondansetron (ZOFRAN) 8 MG tablet Take 8 mg by mouth every 8 (eight) hours as needed.     OVER THE COUNTER MEDICATION Apply 1 drop topically daily as needed. 1 drop CBD oil in vicks vapor rub . Applied to right  thigh for pain relief.     oxyCODONE-acetaminophen (PERCOCET/ROXICET) 5-325 MG tablet Take 1 tablet by mouth every 8 (eight) hours as needed for severe pain. (Patient not taking: Reported on 06/08/2018) 30 tablet 0   prednisoLONE acetate (PRED FORTE) 1 % ophthalmic suspension Place 1 drop into both eyes 4 (four) times daily.  0   PRESCRIPTION MEDICATION Inject 1 Dose as directed every 21 ( twenty-one) days. Chemo     prochlorperazine (COMPAZINE) 10 MG tablet Take 1 tablet (10 mg total) by mouth every 6 (six) hours as needed for nausea or vomiting. (Patient not taking: Reported on 06/08/2018) 30 tablet 0   No current facility-administered medications for this visit.     SURGICAL HISTORY:  Past Surgical History:  Procedure Laterality Date   CESAREAN SECTION     myomectomy   COLONOSCOPY W/ POLYPECTOMY     IR GENERIC HISTORICAL  08/17/2016   IR US GUIDE VASC ACCESS RIGHT 08/17/2016 [name], MD [facility]   IR GENERIC HISTORICAL  08/17/2016   IR FLUORO GUIDE PORT INSERTION RIGHT 08/17/2016 [name], MD [facility]   MEDIASTINOSCOPY N/A 09/25/2015   Procedure: MEDIASTINOSCOPY;  Surgeon: Melrose Nakayama, MD;  Location: [facility];  Service: Thoracic;  Laterality: N/A;   VIDEO BRONCHOSCOPY Bilateral 08/19/2015   Procedure: VIDEO BRONCHOSCOPY WITH FLUORO;  Surgeon: Rigoberto Noel, MD;  Location: [place];  Service: Cardiopulmonary;  Laterality: Bilateral;   VIDEO BRONCHOSCOPY N/A 09/08/2015   Procedure: VIDEO BRONCHOSCOPY WITH FLUORO;  Surgeon: Rigoberto Noel, MD;  Location: [facility];  Service: Thoracic;  Laterality: N/A;   VIDEO BRONCHOSCOPY WITH ENDOBRONCHIAL ULTRASOUND Right 09/08/2015   Procedure: ATTEMPTED VIDEO BRONCHOSCOPY ENDOBRONCHIAL ULTRASOUND  ;  Surgeon: Rigoberto Noel, MD;  Location: [facility];  Service: Thoracic;  Laterality: Right;    REVIEW OF SYSTEMS:  Constitutional: positive for fatigue Eyes: negative Ears, nose, mouth, throat, and face: negative Respiratory:  negative Cardiovascular: negative Gastrointestinal: negative Genitourinary:negative Integument/breast: negative Hematologic/lymphatic: negative Musculoskeletal:positive for back pain Neurological: negative Behavioral/Psych: negative Endocrine: negative Allergic/Immunologic: negative   PHYSICAL EXAMINATION: General appearance: alert, cooperative, fatigued and no distress Head: Normocephalic, without obvious abnormality, atraumatic Neck: no adenopathy, no JVD, supple, symmetrical, trachea midline and thyroid not enlarged, symmetric, no tenderness/mass/nodules Lymph nodes: Cervical, supraclavicular, and axillary nodes normal. Resp: clear to auscultation bilaterally Back: symmetric, no curvature. ROM normal. No CVA tenderness. Cardio: regular rate and rhythm, S1, S2 normal, no murmur, click, rub or gallop GI: soft, non-tender; bowel sounds normal; no masses,  no organomegaly Extremities: extremities normal, atraumatic, no cyanosis or edema Neurologic: Alert and oriented X 3, normal strength and tone. Normal symmetric reflexes. Normal coordination and gait  ECOG PERFORMANCE STATUS: 1 - Symptomatic but completely ambulatory  Blood pressure (!) 149/70, pulse 86, temperature 97.9 F (36.6 C), temperature source Oral, resp. rate 18, height _0  (1.753  m), weight 147 lb 6.4 oz (66.9 kg), last menstrual period 06/09/1998, SpO2 100 %.  LABORATORY DATA: Lab Results  Component Value Date   WBC 4.8 08/30/2018   HGB 10.1 (L) 08/30/2018   HCT 30.8 (L) 08/30/2018   MCV 103.4 (H) 08/30/2018   PLT 294 08/30/2018      Chemistry      Component Value Date/Time   NA 140 08/09/2018 0815   NA 137 06/09/2017 0901   K 3.9 08/09/2018 0815   K 3.9 06/09/2017 0901   CL 107 08/09/2018 0815   CO2 25 08/09/2018 0815   CO2 25 06/09/2017 0901   BUN 17 08/09/2018 0815   BUN 19.0 06/09/2017 0901   CREATININE 1.14 (H) 08/09/2018 0815   CREATININE 1.2 (H) 06/09/2017 0901   GLU 170 01/23/2016        Component Value Date/Time   CALCIUM 9.0 08/09/2018 0815   CALCIUM 9.9 06/09/2017 0901   ALKPHOS 67 08/09/2018 0815   ALKPHOS 63 06/09/2017 0901   AST 31 08/09/2018 0815   AST 35 (H) 06/09/2017 0901   ALT 13 08/09/2018 0815   ALT 23 06/09/2017 0901   BILITOT 0.4 08/09/2018 0815   BILITOT 0.65 06/09/2017 0901       RADIOGRAPHIC STUDIES: Ct Chest W Contrast  Result Date: 08/28/2018 CLINICAL DATA:  Stage IV right lower lobe lung adenocarcinoma diagnosed 2017 with ongoing chemotherapy and history of radiation therapy. EXAM: CT CHEST, ABDOMEN, AND PELVIS WITH CONTRAST TECHNIQUE: Multidetector CT imaging of the chest, abdomen and pelvis was performed following the standard protocol during bolus administration of intravenous contrast. CONTRAST:  189m OMNIPAQUE IOHEXOL 300 MG/ML  SOLN COMPARISON:  06/05/2018 CT chest, abdomen and pelvis. FINDINGS: CT CHEST FINDINGS Cardiovascular: Stable borderline mild cardiomegaly. No significant pericardial effusion/thickening. Right internal jugular Port-A-Cath terminates in the lower third of the SVC. Atherosclerotic nonaneurysmal thoracic aorta. Normal caliber pulmonary arteries. No central pulmonary emboli. Mediastinum/Nodes: No discrete thyroid nodules. Unremarkable esophagus. No pathologically enlarged axillary, mediastinal or hilar lymph nodes. Lungs/Pleura: No pneumothorax. No pleural effusion. Irregular 3.0 x 2.4 cm anterior right lower lobe lung mass (series 6/image 84), previously 2.9 x 2.2 cm using similar measurement technique, not appreciably changed. Numerous (greater than 15) subsolid pulmonary nodules scattered throughout both lungs, not appreciably changed. Representative 1.1 cm right upper lobe nodule (series 6/image 44), previously 1.1 cm using similar measurement technique, stable. Left upper lobe 1.0 cm nodule (series 6/image 73), previously 1.0 cm, stable. Medial right lower lobe 1.4 cm nodule (series 6/image 94), previously 1.4 cm, stable.  Basilar left lower lobe 0.8 cm nodule (series 6/image 107), previously 0.8 cm, stable. No new significant pulmonary nodules. Musculoskeletal: Innumerable small sclerotic osseous lesions scattered throughout the thoracic spine, sternum, clavicles, ribs and shoulder girdles, not appreciably changed. No appreciable new focal osseous lesions in the chest. Mild thoracic spondylosis. CT ABDOMEN PELVIS FINDINGS Hepatobiliary: Normal liver with no liver mass. Cholelithiasis. No biliary ductal dilatation. Pancreas: Normal, with no mass or duct dilation. Spleen: Normal size. No mass. Adrenals/Urinary Tract: Normal adrenals. Normal orthotopic right kidney. Stable mild rotated upper left pelvic kidney. Stable mild fullness of the left renal collecting system without overt left hydronephrosis. No right hydronephrosis. No renal masses. Normal bladder. Stomach/Bowel: Normal non-distended stomach. Congenital bowel malrotation. Duodenal jejunal junction located to the right of the spine. Small bowel located entirely in the right abdomen. No small bowel dilatation or wall thickening. Normal appendix. Midline pelvic cecum. Colon located in the left abdomen. No large bowel  wall thickening, diverticulosis or acute pericolonic fat stranding. Vascular/Lymphatic: Atherosclerotic nonaneurysmal abdominal aorta. Patent portal, splenic, hepatic and renal veins. No pathologically enlarged lymph nodes in the abdomen or pelvis. Reproductive: Grossly normal uterus.  No adnexal mass. Other: No pneumoperitoneum, ascites or focal fluid collection. Musculoskeletal: Widespread small sclerotic osseous lesions throughout the lumbar spine and bilateral pelvic girdle, not appreciably changed. No appreciable new focal osseous lesions. Moderate lumbar spondylosis. IMPRESSION: 1. No new or progressive metastatic disease. 2. Right lower lobe lung mass is not substantially changed. Numerous bilateral subsolid pulmonary metastases are stable. 3. Widespread small  sclerotic osseous metastases throughout axial and proximal appendicular skeleton, stable. 4. Aortic Atherosclerosis (ICD10-I70.0). Numerous chronic findings as detailed. Electronically Signed   By: Ilona Sorrel M.D.   On: 08/28/2018 17:23   Ct Abdomen Pelvis W Contrast  Result Date: 08/28/2018 CLINICAL DATA:  Stage IV right lower lobe lung adenocarcinoma diagnosed 2017 with ongoing chemotherapy and history of radiation therapy. EXAM: CT CHEST, ABDOMEN, AND PELVIS WITH CONTRAST TECHNIQUE: Multidetector CT imaging of the chest, abdomen and pelvis was performed following the standard protocol during bolus administration of intravenous contrast. CONTRAST:  179m OMNIPAQUE IOHEXOL 300 MG/ML  SOLN COMPARISON:  06/05/2018 CT chest, abdomen and pelvis. FINDINGS: CT CHEST FINDINGS Cardiovascular: Stable borderline mild cardiomegaly. No significant pericardial effusion/thickening. Right internal jugular Port-A-Cath terminates in the lower third of the SVC. Atherosclerotic nonaneurysmal thoracic aorta. Normal caliber pulmonary arteries. No central pulmonary emboli. Mediastinum/Nodes: No discrete thyroid nodules. Unremarkable esophagus. No pathologically enlarged axillary, mediastinal or hilar lymph nodes. Lungs/Pleura: No pneumothorax. No pleural effusion. Irregular 3.0 x 2.4 cm anterior right lower lobe lung mass (series 6/image 84), previously 2.9 x 2.2 cm using similar measurement technique, not appreciably changed. Numerous (greater than 15) subsolid pulmonary nodules scattered throughout both lungs, not appreciably changed. Representative 1.1 cm right upper lobe nodule (series 6/image 44), previously 1.1 cm using similar measurement technique, stable. Left upper lobe 1.0 cm nodule (series 6/image 73), previously 1.0 cm, stable. Medial right lower lobe 1.4 cm nodule (series 6/image 94), previously 1.4 cm, stable. Basilar left lower lobe 0.8 cm nodule (series 6/image 107), previously 0.8 cm, stable. No new significant  pulmonary nodules. Musculoskeletal: Innumerable small sclerotic osseous lesions scattered throughout the thoracic spine, sternum, clavicles, ribs and shoulder girdles, not appreciably changed. No appreciable new focal osseous lesions in the chest. Mild thoracic spondylosis. CT ABDOMEN PELVIS FINDINGS Hepatobiliary: Normal liver with no liver mass. Cholelithiasis. No biliary ductal dilatation. Pancreas: Normal, with no mass or duct dilation. Spleen: Normal size. No mass. Adrenals/Urinary Tract: Normal adrenals. Normal orthotopic right kidney. Stable mild rotated upper left pelvic kidney. Stable mild fullness of the left renal collecting system without overt left hydronephrosis. No right hydronephrosis. No renal masses. Normal bladder. Stomach/Bowel: Normal non-distended stomach. Congenital bowel malrotation. Duodenal jejunal junction located to the right of the spine. Small bowel located entirely in the right abdomen. No small bowel dilatation or wall thickening. Normal appendix. Midline pelvic cecum. Colon located in the left abdomen. No large bowel wall thickening, diverticulosis or acute pericolonic fat stranding. Vascular/Lymphatic: Atherosclerotic nonaneurysmal abdominal aorta. Patent portal, splenic, hepatic and renal veins. No pathologically enlarged lymph nodes in the abdomen or pelvis. Reproductive: Grossly normal uterus.  No adnexal mass. Other: No pneumoperitoneum, ascites or focal fluid collection. Musculoskeletal: Widespread small sclerotic osseous lesions throughout the lumbar spine and bilateral pelvic girdle, not appreciably changed. No appreciable new focal osseous lesions. Moderate lumbar spondylosis. IMPRESSION: 1. No new or progressive metastatic disease. 2.  Right lower lobe lung mass is not substantially changed. Numerous bilateral subsolid pulmonary metastases are stable. 3. Widespread small sclerotic osseous metastases throughout axial and proximal appendicular skeleton, stable. 4. Aortic  Atherosclerosis (ICD10-I70.0). Numerous chronic findings as detailed. Electronically Signed   By: Ilona Sorrel M.D.   On: 08/28/2018 17:23    ASSESSMENT AND PLAN:  This is a very pleasant 73 years old Hispanic female with metastatic non-small cell lung cancer, adenocarcinoma status post induction systemic chemotherapy with carboplatin and Alimta and she is currently on maintenance treatment with single agent Alimta status post 42 cycles. The patient has been tolerating this treatment well with no concerning adverse effects. She had repeat CT scan of the chest, abdomen and pelvis performed recently.  I personally and independently reviewed the scans and discussed the results with the patient today. Her scan showed no concerning findings for disease progression and she continues to have stable disease in the lung as well as the bone metastasis. I recommended for the patient to proceed with cycle #43 today as a schedule. For the anemia she will continue on oral iron tablets for now. I will see the patient back for follow-up visit in 3 weeks for evaluation before starting cycle #44. She was advised to call immediately if she has any concerning symptoms in the interval. The patient voices understanding of current disease status and treatment options and is in agreement with the current care plan. All questions were answered. The patient knows to call the clinic with any problems, questions or concerns. We can certainly see the patient much sooner if necessary.  Disclaimer: This note was dictated with voice recognition software. Similar sounding words can inadvertently be transcribed and may not be corrected upon review.

## 2018-08-30 NOTE — Patient Instructions (Signed)
Coronavirus (COVID-19) Are you at risk?  Are you at risk for the Coronavirus (COVID-19)?  To be considered HIGH RISK for Coronavirus (COVID-19), you have to meet the following criteria:  . Traveled to [place], [place], [place], [place] or [place]; or in the [place] to [place], [place], [place], or [place]; and have fever, cough, and shortness of breath within the last 2 weeks of travel OR . Been in close contact with a person diagnosed with COVID-19 within the last 2 weeks and have fever, cough, and shortness of breath . IF YOU DO NOT MEET THESE CRITERIA, YOU ARE CONSIDERED LOW RISK FOR COVID-19.  What to do if you are HIGH RISK for COVID-19?  . If you are having a medical emergency, call 911. . Seek medical care right away. Before you go to a doctor's office, urgent care or emergency department, call ahead and tell them about your recent travel, contact with someone diagnosed with COVID-19, and your symptoms. You should receive instructions from your physician's office regarding next steps of care.  . When you arrive at healthcare provider, tell the healthcare staff immediately you have returned from visiting [place], [place], [place], [place] or [place]; or traveled in the [place] to [place], [place], [place], or [place]; in the last two weeks or you have been in close contact with a person diagnosed with COVID-19 in the last 2 weeks.   . Tell the health care staff about your symptoms: fever, cough and shortness of breath. . After you have been seen by a medical provider, you will be either: o Tested for (COVID-19) and discharged home on quarantine except to seek medical care if symptoms worsen, and asked to  - Stay home and avoid contact with others until you get your results (4-5 days)  - Avoid travel on public transportation if possible (such as bus, train, or airplane) or o Sent to the Emergency Department by EMS for evaluation, COVID-19 testing, and possible  admission depending on your condition and test results.  What to do if you are LOW RISK for COVID-19?  Reduce your risk of any infection by using the same precautions used for avoiding the common cold or flu:  . Wash your hands often with soap and warm water for at least 20 seconds.  If soap and water are not readily available, use an alcohol-based hand sanitizer with at least 60% alcohol.  . If coughing or sneezing, cover your mouth and nose by coughing or sneezing into the elbow areas of your shirt or coat, into a tissue or into your sleeve (not your hands). . Avoid shaking hands with others and consider head nods or verbal greetings only. . Avoid touching your eyes, nose, or mouth with unwashed hands.  . Avoid close contact with people who are sick. . Avoid places or events with large numbers of people in one location, like concerts or sporting events. . Carefully consider travel plans you have or are making. . If you are planning any travel outside or inside the [place], visit the CDC's Travelers' Health webpage for the latest health notices. . If you have some symptoms but not all symptoms, continue to monitor at home and seek medical attention if your symptoms worsen. . If you are having a medical emergency, call 911.   ADDITIONAL HEALTHCARE OPTIONS FOR PATIENTS  [facility] / e-Visit: [email or web address]         [facility]: [phone]  [facility]   Urgent Care: [facility]: [place] Discharge Instructions for Patients Receiving Chemotherapy  Today you received the following chemotherapy agents: Pemetrexed (Alimta)  To help prevent nausea and vomiting after your treatment, we encourage you to take your nausea medication as directed.    If you develop nausea and vomiting that is not controlled by your nausea medication, call the clinic.    BELOW ARE SYMPTOMS THAT SHOULD BE REPORTED IMMEDIATELY:  *FEVER GREATER THAN 100.5 F  *CHILLS WITH OR WITHOUT FEVER  NAUSEA AND VOMITING THAT IS NOT CONTROLLED WITH YOUR NAUSEA MEDICATION  *UNUSUAL SHORTNESS OF BREATH  *UNUSUAL BRUISING OR BLEEDING  TENDERNESS IN MOUTH AND THROAT WITH OR WITHOUT PRESENCE OF ULCERS  *URINARY PROBLEMS  *BOWEL PROBLEMS  UNUSUAL RASH Items with * indicate a potential emergency and should be followed up as soon as possible.  Feel free to call the clinic should you have any questions or concerns. The clinic phone number is [phone].  Please show the [facility] at check-in to the Emergency Department and triage nurse.

## 2018-09-20 ENCOUNTER — Inpatient Hospital Stay: Payer: Medicare Other

## 2018-09-20 ENCOUNTER — Encounter: Payer: Self-pay | Admitting: Physician Assistant

## 2018-09-20 ENCOUNTER — Inpatient Hospital Stay: Payer: Medicare Other | Attending: Internal Medicine | Admitting: Physician Assistant

## 2018-09-20 ENCOUNTER — Other Ambulatory Visit: Payer: Self-pay

## 2018-09-20 DIAGNOSIS — Z5111 Encounter for antineoplastic chemotherapy: Secondary | ICD-10-CM | POA: Diagnosis not present

## 2018-09-20 DIAGNOSIS — Z95828 Presence of other vascular implants and grafts: Secondary | ICD-10-CM

## 2018-09-20 DIAGNOSIS — R600 Localized edema: Secondary | ICD-10-CM

## 2018-09-20 DIAGNOSIS — C3491 Malignant neoplasm of unspecified part of right bronchus or lung: Secondary | ICD-10-CM

## 2018-09-20 DIAGNOSIS — G893 Neoplasm related pain (acute) (chronic): Secondary | ICD-10-CM | POA: Diagnosis not present

## 2018-09-20 DIAGNOSIS — C3431 Malignant neoplasm of lower lobe, right bronchus or lung: Secondary | ICD-10-CM | POA: Diagnosis not present

## 2018-09-20 DIAGNOSIS — C7951 Secondary malignant neoplasm of bone: Secondary | ICD-10-CM | POA: Insufficient documentation

## 2018-09-20 LAB — CBC WITH DIFFERENTIAL (CANCER CENTER ONLY)
Abs Immature Granulocytes: 0.02 10*3/uL (ref 0.00–0.07)
Basophils Absolute: 0 10*3/uL (ref 0.0–0.1)
Basophils Relative: 0 %
Eosinophils Absolute: 0.2 10*3/uL (ref 0.0–0.5)
Eosinophils Relative: 3 %
HCT: 30.2 % — ABNORMAL LOW (ref 36.0–46.0)
Hemoglobin: 9.9 g/dL — ABNORMAL LOW (ref 12.0–15.0)
Immature Granulocytes: 0 %
Lymphocytes Relative: 12 %
Lymphs Abs: 0.7 10*3/uL (ref 0.7–4.0)
MCH: 33.3 pg (ref 26.0–34.0)
MCHC: 32.8 g/dL (ref 30.0–36.0)
MCV: 101.7 fL — ABNORMAL HIGH (ref 80.0–100.0)
Monocytes Absolute: 0.6 10*3/uL (ref 0.1–1.0)
Monocytes Relative: 11 %
Neutro Abs: 4.1 10*3/uL (ref 1.7–7.7)
Neutrophils Relative %: 74 %
Platelet Count: 245 10*3/uL (ref 150–400)
RBC: 2.97 MIL/uL — ABNORMAL LOW (ref 3.87–5.11)
RDW: 14.6 % (ref 11.5–15.5)
WBC Count: 5.6 10*3/uL (ref 4.0–10.5)
nRBC: 0 % (ref 0.0–0.2)

## 2018-09-20 LAB — CMP (CANCER CENTER ONLY)
ALT: 13 U/L (ref 0–44)
AST: 31 U/L (ref 15–41)
Albumin: 3.3 g/dL — ABNORMAL LOW (ref 3.5–5.0)
Alkaline Phosphatase: 61 U/L (ref 38–126)
Anion gap: 10 (ref 5–15)
BUN: 18 mg/dL (ref 8–23)
CO2: 24 mmol/L (ref 22–32)
Calcium: 9.2 mg/dL (ref 8.9–10.3)
Chloride: 106 mmol/L (ref 98–111)
Creatinine: 1.07 mg/dL — ABNORMAL HIGH (ref 0.44–1.00)
GFR, Est AFR Am: >60 mL/min (ref >60)
GFR, Estimated: 52 mL/min — ABNORMAL LOW (ref >60)
Glucose, Bld: 92 mg/dL (ref 70–99)
Potassium: 3.9 mmol/L (ref 3.5–5.1)
Sodium: 140 mmol/L (ref 135–145)
Total Bilirubin: 0.5 mg/dL (ref 0.3–1.2)
Total Protein: 7 g/dL (ref 6.5–8.1)

## 2018-09-20 MED ORDER — DEXAMETHASONE 4 MG PO TABS: ORAL_TABLET | ORAL | 1 refills | Status: DC

## 2018-09-20 MED ORDER — PROCHLORPERAZINE MALEATE 10 MG PO TABS
ORAL_TABLET | ORAL | Status: AC
  Filled 2018-09-20: qty 1

## 2018-09-20 MED ORDER — ZOLEDRONIC ACID 4 MG/100ML IV SOLN
4.0000 mg | Freq: Once | INTRAVENOUS | Status: AC
  Administered 2018-09-20: 4 mg via INTRAVENOUS
  Filled 2018-09-20: qty 100

## 2018-09-20 MED ORDER — SODIUM CHLORIDE 0.9% FLUSH
10.0000 mL | Freq: Once | INTRAVENOUS | Status: AC
  Administered 2018-09-20: 10 mL
  Filled 2018-09-20: qty 10

## 2018-09-20 MED ORDER — PROCHLORPERAZINE MALEATE 10 MG PO TABS
10.0000 mg | ORAL_TABLET | Freq: Once | ORAL | Status: AC
  Administered 2018-09-20: 10 mg via ORAL

## 2018-09-20 MED ORDER — SODIUM CHLORIDE 0.9% FLUSH
10.0000 mL | INTRAVENOUS | Status: DC | PRN
  Administered 2018-09-20: 10 mL
  Filled 2018-09-20: qty 10

## 2018-09-20 MED ORDER — BENZONATATE 100 MG PO CAPS: 100.0000 mg | ORAL_CAPSULE | Freq: Three times a day (TID) | ORAL | 1 refills | Status: DC | PRN

## 2018-09-20 MED ORDER — MELOXICAM 15 MG PO TABS: ORAL_TABLET | ORAL | 0 refills | Status: DC

## 2018-09-20 MED ORDER — SODIUM CHLORIDE 0.9 % IV SOLN
480.0000 mg/m2 | Freq: Once | INTRAVENOUS | Status: AC
  Administered 2018-09-20: 900 mg via INTRAVENOUS
  Filled 2018-09-20: qty 20

## 2018-09-20 MED ORDER — SODIUM CHLORIDE 0.9 % IV SOLN
Freq: Once | INTRAVENOUS | Status: AC
  Administered 2018-09-20: 11:00:00 via INTRAVENOUS
  Filled 2018-09-20: qty 250

## 2018-09-20 MED ORDER — LIDOCAINE-PRILOCAINE 2.5-2.5 % EX CREA: 1.0000 "application " | TOPICAL_CREAM | CUTANEOUS | 0 refills | Status: DC | PRN

## 2018-09-20 MED ORDER — HEPARIN SOD (PORK) LOCK FLUSH 100 UNIT/ML IV SOLN
500.0000 [IU] | Freq: Once | INTRAVENOUS | Status: AC | PRN
  Administered 2018-09-20: 500 [IU]
  Filled 2018-09-20: qty 5

## 2018-09-20 NOTE — Patient Instructions (Addendum)
Coronavirus (COVID-19) Are you at risk?  Are you at risk for the Coronavirus (COVID-19)?  To be considered HIGH RISK for Coronavirus (COVID-19), you have to meet the following criteria:  . Traveled to [place], [place], [place], [place] or [place]; or in the [place] to [place], [place], [place], or [place]; and have fever, cough, and shortness of breath within the last 2 weeks of travel OR . Been in close contact with a person diagnosed with COVID-19 within the last 2 weeks and have fever, cough, and shortness of breath . IF YOU DO NOT MEET THESE CRITERIA, YOU ARE CONSIDERED LOW RISK FOR COVID-19.  What to do if you are HIGH RISK for COVID-19?  . If you are having a medical emergency, call 911. . Seek medical care right away. Before you go to a doctor's office, urgent care or emergency department, call ahead and tell them about your recent travel, contact with someone diagnosed with COVID-19, and your symptoms. You should receive instructions from your physician's office regarding next steps of care.  . When you arrive at healthcare provider, tell the healthcare staff immediately you have returned from visiting [place], [place], [place], [place] or [place]; or traveled in the [place] to [place], [place], [place], or [place]; in the last two weeks or you have been in close contact with a person diagnosed with COVID-19 in the last 2 weeks.   . Tell the health care staff about your symptoms: fever, cough and shortness of breath. . After you have been seen by a medical provider, you will be either: o Tested for (COVID-19) and discharged home on quarantine except to seek medical care if symptoms worsen, and asked to  - Stay home and avoid contact with others until you get your results (4-5 days)  - Avoid travel on public transportation if possible (such as bus, train, or airplane) or o Sent to the Emergency Department by EMS for evaluation, COVID-19 testing, and possible  admission depending on your condition and test results.  What to do if you are LOW RISK for COVID-19?  Reduce your risk of any infection by using the same precautions used for avoiding the common cold or flu:  . Wash your hands often with soap and warm water for at least 20 seconds.  If soap and water are not readily available, use an alcohol-based hand sanitizer with at least 60% alcohol.  . If coughing or sneezing, cover your mouth and nose by coughing or sneezing into the elbow areas of your shirt or coat, into a tissue or into your sleeve (not your hands). . Avoid shaking hands with others and consider head nods or verbal greetings only. . Avoid touching your eyes, nose, or mouth with unwashed hands.  . Avoid close contact with people who are sick. . Avoid places or events with large numbers of people in one location, like concerts or sporting events. . Carefully consider travel plans you have or are making. . If you are planning any travel outside or inside the [place], visit the CDC's Travelers' Health webpage for the latest health notices. . If you have some symptoms but not all symptoms, continue to monitor at home and seek medical attention if your symptoms worsen. . If you are having a medical emergency, call 911.   ADDITIONAL HEALTHCARE OPTIONS FOR PATIENTS  [facility] / e-Visit: [email or web address]         [facility]: [phone]  [place]: [facility]: [facility] Discharge Instructions for Patients Receiving Chemotherapy  Today you received the following chemotherapy agents: Pemetrexed (Alimta)  To help prevent nausea and vomiting after your treatment, we encourage you to take your nausea medication as directed.    If you develop nausea and vomiting that is not controlled by your nausea medication, call the clinic.    BELOW ARE SYMPTOMS THAT SHOULD BE REPORTED IMMEDIATELY:  *FEVER GREATER THAN 100.5 F  *CHILLS WITH OR WITHOUT FEVER  NAUSEA AND VOMITING THAT IS NOT CONTROLLED WITH YOUR NAUSEA MEDICATION  *UNUSUAL SHORTNESS OF BREATH  *UNUSUAL BRUISING OR BLEEDING  TENDERNESS IN MOUTH AND THROAT WITH OR WITHOUT PRESENCE OF ULCERS  *URINARY PROBLEMS  *BOWEL PROBLEMS  UNUSUAL RASH Items with * indicate a potential emergency and should be followed up as soon as possible.  Feel free to call the clinic should you have any questions or concerns. The clinic phone number is [phone].  Please show the [facility] at check-in to the Emergency Department and triage nurse.  Zoledronic Acid injection (Hypercalcemia, Oncology) What is this medicine? ZOLEDRONIC ACID (ZOE le dron ik AS id) lowers the amount of calcium loss from bone. It is used to treat too much calcium in your blood from cancer. It is also used to prevent complications of cancer that has spread to the bone. This medicine may be used for other purposes; ask your health care provider or pharmacist if you have questions. COMMON BRAND NAME(S): Zometa What should I tell my health care provider before I take this medicine? They need to know if you have any of these conditions: -aspirin-sensitive asthma -cancer, especially if you are receiving medicines used to treat cancer -dental disease or wear dentures -infection -kidney disease -receiving corticosteroids like dexamethasone or prednisone -an unusual or allergic reaction to zoledronic acid, other medicines, foods, dyes, or preservatives -pregnant or trying to get pregnant -breast-feeding How should I use this medicine? This medicine is for infusion into a vein. It is given by a health care professional in a hospital or clinic setting. Talk to your pediatrician regarding the use of this medicine in children. Special care may be needed. Overdosage: If you think you have taken  too much of this medicine contact a poison control center or emergency room at once. NOTE: This medicine is only for you. Do not share this medicine with others. What if I miss a dose? It is important not to miss your dose. Call your doctor or health care professional if you are unable to keep an appointment. What may interact with this medicine? -certain antibiotics given by injection -NSAIDs, medicines for pain and inflammation, like ibuprofen or naproxen -some diuretics like bumetanide, furosemide -teriparatide -thalidomide This list may not describe all possible interactions. Give your health care provider a list of all the medicines, herbs, non-prescription drugs, or dietary supplements you use. Also tell them if you smoke, drink alcohol, or use illegal drugs. Some items may interact with your medicine. What should I watch for while using this medicine? Visit your doctor or health care professional for regular checkups. It may be some time before you see the benefit from this medicine. Do not stop taking your medicine unless your doctor tells you to. Your doctor may order blood tests or other tests to see how you are doing.  Women should inform their doctor if they wish to become pregnant or think they might be pregnant. There is a potential for serious side effects to an unborn child. Talk to your health care professional or pharmacist for more information. You should make sure that you get enough calcium and vitamin D while you are taking this medicine. Discuss the foods you eat and the vitamins you take with your health care professional. Some people who take this medicine have severe bone, joint, and/or muscle pain. This medicine may also increase your risk for jaw problems or a broken thigh bone. Tell your doctor right away if you have severe pain in your jaw, bones, joints, or muscles. Tell your doctor if you have any pain that does not go away or that gets worse. Tell your dentist and  dental surgeon that you are taking this medicine. You should not have major dental surgery while on this medicine. See your dentist to have a dental exam and fix any dental problems before starting this medicine. Take good care of your teeth while on this medicine. Make sure you see your dentist for regular follow-up appointments. What side effects may I notice from receiving this medicine? Side effects that you should report to your doctor or health care professional as soon as possible: -allergic reactions like skin rash, itching or hives, swelling of the face, lips, or tongue -anxiety, confusion, or depression -breathing problems -changes in vision -eye pain -feeling faint or lightheaded, falls -jaw pain, especially after dental work -mouth sores -muscle cramps, stiffness, or weakness -redness, blistering, peeling or loosening of the skin, including inside the mouth -trouble passing urine or change in the amount of urine Side effects that usually do not require medical attention (report to your doctor or health care professional if they continue or are bothersome): -bone, joint, or muscle pain -constipation -diarrhea -fever -hair loss -irritation at site where injected -loss of appetite -nausea, vomiting -stomach upset -trouble sleeping -trouble swallowing -weak or tired This list may not describe all possible side effects. Call your doctor for medical advice about side effects. You may report side effects to FDA at 1-800-FDA-1088. Where should I keep my medicine? This drug is given in a hospital or clinic and will not be stored at home. NOTE: This sheet is a summary. It may not cover all possible information. If you have questions about this medicine, talk to your doctor, pharmacist, or health care provider.  2019 [employer] (2013-10-20 14:19:39)

## 2018-09-20 NOTE — Patient Instructions (Signed)

## 2018-09-20 NOTE — Progress Notes (Signed)
[place], MD [address] [place] [address]  DIAGNOSIS: Stage IV (T1b, N2, M1b) non-small cell lung cancer, adenocarcinoma diagnosed in March 2017 and presented with right lower lobe lung nodule in addition to mediastinal lymphadenopathy and metastatic bone lesions.  Molecular studies:PDL 1 TPS <1%.   [employer] Studies: Positive for ERBB2 [id number]. Negative for EGFR, KRAS, ALK, BRAF, MET, RET and ROS1.  PRIOR THERAPY:  1) Induction systemic chemotherapy with carboplatin for AUC of 5 and Alimta 500 MG/M2 every 3 weeks is status post 6 cycles at the [facility], last dose was given 03/05/2016 with stable disease. 2) palliative radiation to the metastatic bone disease in the lower back and pelvic area  CURRENT THERAPY:  1)  Maintenance systemic chemotherapy with Alimta 500 MG/M2 every 3 weeks status post 43 cycles. First dose was given 03/25/2016.  2) Zometa 4 mg IV every 12 week for metastatic bone disease.  INTERVAL HISTORY: Valerie Long 73 y.o. female returns to the clinic for a follow-up visit.  She is feeling well today without any concerning complaints except for continued low back pain from her metastatic bone disease.  She continues to tolerate her maintenance therapy with Alimta fairly well without any adverse effects.  She denies any fever, chills, night sweats, or weight loss.  She denies any chest pain, shortness of breath, cough, or hemoptysis.  She denies any nausea, vomiting, diarrhea, or constipation.  She denies any visual changes.  She is here today for evaluation before starting cycle #44 of her treatment today.  MEDICAL HISTORY: Past Medical History:  Diagnosis Date  . Adenocarcinoma of right lung, stage 4 ([place]) 2017  . Anemia   . Arthritis   . Bone metastases ([place]) 02/24/2017  . Encounter for antineoplastic chemotherapy 03/17/2016  . Hypercholesterolemia   .  Hypertension 06/18/2016  . Osteopenia   . 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 Pneumonia   . Shortness of breath dyspnea     ALLERGIES:  has No Known Allergies.  MEDICATIONS:  Current Outpatient Medications  Medication Sig Dispense Refill  . acetaminophen (TYLENOL) 500 MG tablet Take 1,000 mg by mouth every 6 (six) hours as needed for moderate pain or headache.    . baclofen (LIORESAL) 10 MG tablet Take 1 tablet (10 mg total) by mouth 3 (three) times daily. 30 each 0  . benzonatate (TESSALON) 100 MG capsule Take 1 capsule (100 mg total) by mouth 3 (three) times daily as needed for cough. 40 capsule 1  . Calcium Carb-Cholecalciferol (CALCIUM 600/VITAMIN D3 PO) Take by mouth.    . Cyanocobalamin (B-12 PO) Take 2 tablets by mouth daily.    Marland Kitchen dexamethasone (DECADRON) 4 MG tablet Take 76m by mouth twice daily the day before, of, and after chemo 40 tablet 1  . Fish Oil-Cholecalciferol (OMEGA-3 + VITAMIN D3 PO) Take 1 tablet by mouth daily.    . folic acid (FOLVITE) 1 MG tablet Take 1 tablet (1 mg total) by mouth daily. 90 tablet 1  . ketorolac (ACULAR) 0.5 % ophthalmic solution Place 1 drop into both eyes 4 (four) times daily.  0  . lidocaine-prilocaine (EMLA) cream Apply 1 application topically as needed. Apply 1-2 tsp over port site 1-2 hours prior to chemo. 30 g 0  . meloxicam (MOBIC) 15 MG tablet TAKE 1 TABLET BY MOUTH EVERY DAY AS NEEDED FOR PAIN 30 tablet 0  .  neomycin-polymyxin b-dexamethasone (MAXITROL) 3.5-10000-0.1 SUSP INSTILL 1 DROP BY OPHTHALMIC ROUTE 4 TIMES PER DAY INTO BOTH EYES.  1  . ofloxacin (OCUFLOX) 0.3 % ophthalmic solution Place 1 drop into both eyes 4 (four) times daily.  0  . ondansetron (ZOFRAN) 8 MG tablet Take 8 mg by mouth every 8 (eight) hours as needed.    Marland Kitchen OVER THE COUNTER MEDICATION Apply 1 drop topically daily as needed. 1 drop CBD oil in vicks vapor rub . Applied to right thigh for pain relief.    Marland Kitchen oxyCODONE-acetaminophen (PERCOCET/ROXICET)  5-325 MG tablet Take 1 tablet by mouth every 8 (eight) hours as needed for severe pain. (Patient not taking: Reported on 06/08/2018) 30 tablet 0  . prednisoLONE acetate (PRED FORTE) 1 % ophthalmic suspension Place 1 drop into both eyes 4 (four) times daily.  0  . PRESCRIPTION MEDICATION Inject 1 Dose as directed every 21 ( twenty-one) days. Chemo    . prochlorperazine (COMPAZINE) 10 MG tablet Take 1 tablet (10 mg total) by mouth every 6 (six) hours as needed for nausea or vomiting. (Patient not taking: Reported on 06/08/2018) 30 tablet 0   No current facility-administered medications for this visit.     SURGICAL HISTORY:  Past Surgical History:  Procedure Laterality Date  . CESAREAN SECTION     myomectomy  . COLONOSCOPY W/ POLYPECTOMY    . IR GENERIC HISTORICAL  08/17/2016   IR US GUIDE VASC ACCESS RIGHT 08/17/2016 [name], MD [facility]  . IR GENERIC HISTORICAL  08/17/2016   IR FLUORO GUIDE PORT INSERTION RIGHT 08/17/2016 [name], MD [facility]  . MEDIASTINOSCOPY N/A 09/25/2015   Procedure: MEDIASTINOSCOPY;  Surgeon: Melrose Nakayama, MD;  Location: [facility];  Service: Thoracic;  Laterality: N/A;  . VIDEO BRONCHOSCOPY Bilateral 08/19/2015   Procedure: VIDEO BRONCHOSCOPY WITH FLUORO;  Surgeon: Rigoberto Noel, MD;  Location: [facility];  Service: Cardiopulmonary;  Laterality: Bilateral;  . VIDEO BRONCHOSCOPY N/A 09/08/2015   Procedure: VIDEO BRONCHOSCOPY WITH FLUORO;  Surgeon: Rigoberto Noel, MD;  Location: [employer] [facility];  Service: Thoracic;  Laterality: N/A;  . VIDEO BRONCHOSCOPY WITH ENDOBRONCHIAL ULTRASOUND Right 09/08/2015   Procedure: ATTEMPTED VIDEO BRONCHOSCOPY ENDOBRONCHIAL ULTRASOUND  ;  Surgeon: Rigoberto Noel, MD;  Location: [facility];  Service: Thoracic;  Laterality: Right;    REVIEW OF SYSTEMS:   Review of Systems  Constitutional: Negative for appetite change, chills, fatigue, fever and unexpected weight change.  HENT:   Negative for mouth sores, nosebleeds, sore throat and  trouble swallowing.   Eyes: Negative for eye problems and icterus.  Respiratory: Negative for cough, hemoptysis, shortness of breath and wheezing.   Cardiovascular: Positive for bilateral leg swelling. Negative for chest pain. Gastrointestinal: Negative for abdominal pain, constipation, diarrhea, nausea and vomiting.  Genitourinary: Negative for bladder incontinence, difficulty urinating, dysuria, frequency and hematuria.   Musculoskeletal: Positive for back pain. Negative for gait problems, neck pain and neck stiffness.  Skin: Negative for itching and rash.  Neurological: Negative for dizziness, extremity weakness, gait problem, headaches, light-headedness and seizures.  Hematological: Negative for adenopathy. Does not bruise/bleed easily.  Psychiatric/Behavioral: Negative for confusion, depression and sleep disturbance. The patient is not nervous/anxious.     PHYSICAL EXAMINATION:  Blood pressure 136/81, pulse 66, temperature 98 F (36.7 C), temperature source Oral, resp. rate 18, height '5\' 9"'  (1.753 m), weight 147 lb 14.4 oz (67.1 kg), last menstrual period 06/09/1998, SpO2 100 %.  ECOG PERFORMANCE STATUS: 1 - Symptomatic but completely ambulatory  Physical  Exam  Constitutional: Oriented to person, place, and time and well-developed, well-nourished, and in no distress.  HENT:  Head: Normocephalic and atraumatic.  Mouth/Throat: Oropharynx is clear and moist. No oropharyngeal exudate.  Eyes: Conjunctivae are normal. Right eye exhibits no discharge. Left eye exhibits no discharge. No scleral icterus.  Neck: Normal range of motion. Neck supple.  Cardiovascular: Normal rate, regular rhythm, normal heart sounds and intact distal pulses.   Pulmonary/Chest: Effort normal and breath sounds normal. No respiratory distress. No wheezes. No rales.  Abdominal: Soft. Bowel sounds are normal. Exhibits no distension and no mass. There is no tenderness.  Musculoskeletal: Normal range of motion.  Exhibits no edema.  Lymphadenopathy:    No cervical adenopathy.  Neurological: Alert and oriented to person, place, and time. Exhibits normal muscle tone. Gait normal. Coordination normal.  Skin: Skin is warm and dry. No rash noted. Not diaphoretic. No erythema. No pallor.  Psychiatric: Mood, memory and judgment normal.  Vitals reviewed.  LABORATORY DATA: Lab Results  Component Value Date   WBC 5.6 09/20/2018   HGB 9.9 (L) 09/20/2018   HCT 30.2 (L) 09/20/2018   MCV 101.7 (H) 09/20/2018   PLT 245 09/20/2018      Chemistry      Component Value Date/Time   NA 140 09/20/2018 0912   NA 137 06/09/2017 0901   K 3.9 09/20/2018 0912   K 3.9 06/09/2017 0901   CL 106 09/20/2018 0912   CO2 24 09/20/2018 0912   CO2 25 06/09/2017 0901   BUN 18 09/20/2018 0912   BUN 19.0 06/09/2017 0901   CREATININE 1.07 (H) 09/20/2018 0912   CREATININE 1.2 (H) 06/09/2017 0901   GLU 170 01/23/2016      Component Value Date/Time   CALCIUM 9.2 09/20/2018 0912   CALCIUM 9.9 06/09/2017 0901   ALKPHOS 61 09/20/2018 0912   ALKPHOS 63 06/09/2017 0901   AST 31 09/20/2018 0912   AST 35 (H) 06/09/2017 0901   ALT 13 09/20/2018 0912   ALT 23 06/09/2017 0901   BILITOT 0.5 09/20/2018 0912   BILITOT 0.65 06/09/2017 0901       RADIOGRAPHIC STUDIES:  Ct Chest W Contrast  Result Date: 08/28/2018 CLINICAL DATA:  Stage IV right lower lobe lung adenocarcinoma diagnosed 2017 with ongoing chemotherapy and history of radiation therapy. EXAM: CT CHEST, ABDOMEN, AND PELVIS WITH CONTRAST TECHNIQUE: Multidetector CT imaging of the chest, abdomen and pelvis was performed following the standard protocol during bolus administration of intravenous contrast. CONTRAST:  136m OMNIPAQUE IOHEXOL 300 MG/ML  SOLN COMPARISON:  06/05/2018 CT chest, abdomen and pelvis. FINDINGS: CT CHEST FINDINGS Cardiovascular: Stable borderline mild cardiomegaly. No significant pericardial effusion/thickening. Right internal jugular Port-A-Cath  terminates in the lower third of the SVC. Atherosclerotic nonaneurysmal thoracic aorta. Normal caliber pulmonary arteries. No central pulmonary emboli. Mediastinum/Nodes: No discrete thyroid nodules. Unremarkable esophagus. No pathologically enlarged axillary, mediastinal or hilar lymph nodes. Lungs/Pleura: No pneumothorax. No pleural effusion. Irregular 3.0 x 2.4 cm anterior right lower lobe lung mass (series 6/image 84), previously 2.9 x 2.2 cm using similar measurement technique, not appreciably changed. Numerous (greater than 15) subsolid pulmonary nodules scattered throughout both lungs, not appreciably changed. Representative 1.1 cm right upper lobe nodule (series 6/image 44), previously 1.1 cm using similar measurement technique, stable. Left upper lobe 1.0 cm nodule (series 6/image 73), previously 1.0 cm, stable. Medial right lower lobe 1.4 cm nodule (series 6/image 94), previously 1.4 cm, stable. Basilar left lower lobe 0.8 cm nodule (series 6/image 107), previously  0.8 cm, stable. No new significant pulmonary nodules. Musculoskeletal: Innumerable small sclerotic osseous lesions scattered throughout the thoracic spine, sternum, clavicles, ribs and shoulder girdles, not appreciably changed. No appreciable new focal osseous lesions in the chest. Mild thoracic spondylosis. CT ABDOMEN PELVIS FINDINGS Hepatobiliary: Normal liver with no liver mass. Cholelithiasis. No biliary ductal dilatation. Pancreas: Normal, with no mass or duct dilation. Spleen: Normal size. No mass. Adrenals/Urinary Tract: Normal adrenals. Normal orthotopic right kidney. Stable mild rotated upper left pelvic kidney. Stable mild fullness of the left renal collecting system without overt left hydronephrosis. No right hydronephrosis. No renal masses. Normal bladder. Stomach/Bowel: Normal non-distended stomach. Congenital bowel malrotation. Duodenal jejunal junction located to the right of the spine. Small bowel located entirely in the right  abdomen. No small bowel dilatation or wall thickening. Normal appendix. Midline pelvic cecum. Colon located in the left abdomen. No large bowel wall thickening, diverticulosis or acute pericolonic fat stranding. Vascular/Lymphatic: Atherosclerotic nonaneurysmal abdominal aorta. Patent portal, splenic, hepatic and renal veins. No pathologically enlarged lymph nodes in the abdomen or pelvis. Reproductive: Grossly normal uterus.  No adnexal mass. Other: No pneumoperitoneum, ascites or focal fluid collection. Musculoskeletal: Widespread small sclerotic osseous lesions throughout the lumbar spine and bilateral pelvic girdle, not appreciably changed. No appreciable new focal osseous lesions. Moderate lumbar spondylosis. IMPRESSION: 1. No new or progressive metastatic disease. 2. Right lower lobe lung mass is not substantially changed. Numerous bilateral subsolid pulmonary metastases are stable. 3. Widespread small sclerotic osseous metastases throughout axial and proximal appendicular skeleton, stable. 4. Aortic Atherosclerosis (ICD10-I70.0). Numerous chronic findings as detailed. Electronically Signed   By: Ilona Sorrel M.D.   On: 08/28/2018 17:23   Ct Abdomen Pelvis W Contrast  Result Date: 08/28/2018 CLINICAL DATA:  Stage IV right lower lobe lung adenocarcinoma diagnosed 2017 with ongoing chemotherapy and history of radiation therapy. EXAM: CT CHEST, ABDOMEN, AND PELVIS WITH CONTRAST TECHNIQUE: Multidetector CT imaging of the chest, abdomen and pelvis was performed following the standard protocol during bolus administration of intravenous contrast. CONTRAST:  147m OMNIPAQUE IOHEXOL 300 MG/ML  SOLN COMPARISON:  06/05/2018 CT chest, abdomen and pelvis. FINDINGS: CT CHEST FINDINGS Cardiovascular: Stable borderline mild cardiomegaly. No significant pericardial effusion/thickening. Right internal jugular Port-A-Cath terminates in the lower third of the SVC. Atherosclerotic nonaneurysmal thoracic aorta. Normal caliber  pulmonary arteries. No central pulmonary emboli. Mediastinum/Nodes: No discrete thyroid nodules. Unremarkable esophagus. No pathologically enlarged axillary, mediastinal or hilar lymph nodes. Lungs/Pleura: No pneumothorax. No pleural effusion. Irregular 3.0 x 2.4 cm anterior right lower lobe lung mass (series 6/image 84), previously 2.9 x 2.2 cm using similar measurement technique, not appreciably changed. Numerous (greater than 15) subsolid pulmonary nodules scattered throughout both lungs, not appreciably changed. Representative 1.1 cm right upper lobe nodule (series 6/image 44), previously 1.1 cm using similar measurement technique, stable. Left upper lobe 1.0 cm nodule (series 6/image 73), previously 1.0 cm, stable. Medial right lower lobe 1.4 cm nodule (series 6/image 94), previously 1.4 cm, stable. Basilar left lower lobe 0.8 cm nodule (series 6/image 107), previously 0.8 cm, stable. No new significant pulmonary nodules. Musculoskeletal: Innumerable small sclerotic osseous lesions scattered throughout the thoracic spine, sternum, clavicles, ribs and shoulder girdles, not appreciably changed. No appreciable new focal osseous lesions in the chest. Mild thoracic spondylosis. CT ABDOMEN PELVIS FINDINGS Hepatobiliary: Normal liver with no liver mass. Cholelithiasis. No biliary ductal dilatation. Pancreas: Normal, with no mass or duct dilation. Spleen: Normal size. No mass. Adrenals/Urinary Tract: Normal adrenals. Normal orthotopic right kidney. Stable mild rotated upper  left pelvic kidney. Stable mild fullness of the left renal collecting system without overt left hydronephrosis. No right hydronephrosis. No renal masses. Normal bladder. Stomach/Bowel: Normal non-distended stomach. Congenital bowel malrotation. Duodenal jejunal junction located to the right of the spine. Small bowel located entirely in the right abdomen. No small bowel dilatation or wall thickening. Normal appendix. Midline pelvic cecum. Colon  located in the left abdomen. No large bowel wall thickening, diverticulosis or acute pericolonic fat stranding. Vascular/Lymphatic: Atherosclerotic nonaneurysmal abdominal aorta. Patent portal, splenic, hepatic and renal veins. No pathologically enlarged lymph nodes in the abdomen or pelvis. Reproductive: Grossly normal uterus.  No adnexal mass. Other: No pneumoperitoneum, ascites or focal fluid collection. Musculoskeletal: Widespread small sclerotic osseous lesions throughout the lumbar spine and bilateral pelvic girdle, not appreciably changed. No appreciable new focal osseous lesions. Moderate lumbar spondylosis. IMPRESSION: 1. No new or progressive metastatic disease. 2. Right lower lobe lung mass is not substantially changed. Numerous bilateral subsolid pulmonary metastases are stable. 3. Widespread small sclerotic osseous metastases throughout axial and proximal appendicular skeleton, stable. 4. Aortic Atherosclerosis (ICD10-I70.0). Numerous chronic findings as detailed. Electronically Signed   By: Ilona Sorrel M.D.   On: 08/28/2018 17:23     ASSESSMENT/PLAN:  This is a very pleasant 73 year old Hispanic female diagnosed with metastatic non-small cell lung cancer, adenocarcinoma.  She presented with a right lower lobe lung nodule in addition to mediastinal lymphadenopathy and metastatic bone lesions.  She was diagnosed in March 2017.  Her PDL 1 expression is <1% and she has no actionable mutations.  She previously underwent induction treatment with systemic chemotherapy with carboplatin and Alimta.  He is currently undergoing maintenance therapy with Alimta.  She is status post 43 cycles.  The patient has been tolerating this treatment fairly well without any concerning adverse effects.  The patient was seen with Dr. Julien Nordmann today.  The labs were reviewed with the patient.  We recommend she proceed with cycle #44 today as scheduled. We will see her back in 3 weeks for evaluation before starting cycle  #45.  She will continue taking her folic acid 1 mg p.o daily.  The patient is requested a refill of Tessalon, Decadron, EMLA cream, and Mobic.  I sent a refill to the patient's pharmacy.  For the patient's lower extremity edema found on physical exam, I advised the patient to elevate her legs and use compression stockings. She states she has some at home and will start using them.  The patient was advised to call immediately if she has any concerning symptoms in the interval. The patient voices understanding of current disease status and treatment options and is in agreement with the current care plan. All questions were answered. The patient knows to call the clinic with any problems, questions or concerns. We can certainly see the patient much sooner if necessary  No orders of the defined types were placed in this encounter.    Andra Matsuo L Mansoor Hillyard, PA-C 09/20/18  ADDENDUM: Hematology/Oncology Attending: I had a face-to-face encounter with the patient today.  I recommended her care plan. This is a very pleasant 73 years old Hispanic female with metastatic non-small cell lung cancer, adenocarcinoma.  She is currently undergoing maintenance treatment with single agent Alimta status post 43 cycles and she has been tolerating this treatment well. She continues to have low back pain. I recommended for the patient to proceed with cycle #44 today as scheduled. For the lower back.  The patient would like to try lidocaine patches that her  sister had.  I will will ask the patient to give it a trial to see if it would help with her pain. She will come back for follow-up visit in 3 weeks for evaluation before the next cycle of her treatment. She was given refill for Tessalon, Decadron and EMLA cream as well as Mobic. The patient was advised to call immediately if she has any concerning symptoms in the interval.  Disclaimer: This note was dictated with voice recognition software. Similar  sounding words can inadvertently be transcribed and may be missed upon review. Eilleen Kempf, MD 09/20/18

## 2018-09-23 ENCOUNTER — Other Ambulatory Visit: Payer: Self-pay | Admitting: Oncology

## 2018-09-23 DIAGNOSIS — C3490 Malignant neoplasm of unspecified part of unspecified bronchus or lung: Secondary | ICD-10-CM

## 2018-09-26 ENCOUNTER — Other Ambulatory Visit: Payer: Self-pay | Admitting: *Deleted

## 2018-09-26 DIAGNOSIS — C3491 Malignant neoplasm of unspecified part of right bronchus or lung: Secondary | ICD-10-CM

## 2018-09-26 DIAGNOSIS — Z5111 Encounter for antineoplastic chemotherapy: Secondary | ICD-10-CM

## 2018-09-26 DIAGNOSIS — C3490 Malignant neoplasm of unspecified part of unspecified bronchus or lung: Secondary | ICD-10-CM

## 2018-09-26 MED ORDER — FOLIC ACID 1 MG PO TABS: 1.0000 mg | ORAL_TABLET | Freq: Every day | ORAL | 0 refills | Status: DC

## 2018-10-11 ENCOUNTER — Inpatient Hospital Stay: Payer: Medicare Other | Attending: Internal Medicine

## 2018-10-11 ENCOUNTER — Inpatient Hospital Stay: Payer: Medicare Other

## 2018-10-11 ENCOUNTER — Encounter: Payer: Self-pay | Admitting: Internal Medicine

## 2018-10-11 ENCOUNTER — Telehealth: Payer: Self-pay

## 2018-10-11 ENCOUNTER — Inpatient Hospital Stay (HOSPITAL_BASED_OUTPATIENT_CLINIC_OR_DEPARTMENT_OTHER): Payer: Medicare Other | Admitting: Internal Medicine

## 2018-10-11 ENCOUNTER — Other Ambulatory Visit: Payer: Self-pay

## 2018-10-11 ENCOUNTER — Telehealth: Payer: Self-pay | Admitting: Internal Medicine

## 2018-10-11 VITALS — BP 148/89 | HR 75 | Temp 98.2°F | Resp 20 | Ht 69.0 in | Wt 147.6 lb

## 2018-10-11 DIAGNOSIS — C3431 Malignant neoplasm of lower lobe, right bronchus or lung: Secondary | ICD-10-CM | POA: Insufficient documentation

## 2018-10-11 DIAGNOSIS — C3491 Malignant neoplasm of unspecified part of right bronchus or lung: Secondary | ICD-10-CM

## 2018-10-11 DIAGNOSIS — D6481 Anemia due to antineoplastic chemotherapy: Secondary | ICD-10-CM

## 2018-10-11 DIAGNOSIS — Z5111 Encounter for antineoplastic chemotherapy: Secondary | ICD-10-CM | POA: Insufficient documentation

## 2018-10-11 DIAGNOSIS — C7951 Secondary malignant neoplasm of bone: Secondary | ICD-10-CM

## 2018-10-11 DIAGNOSIS — M545 Low back pain: Secondary | ICD-10-CM

## 2018-10-11 DIAGNOSIS — T451X5A Adverse effect of antineoplastic and immunosuppressive drugs, initial encounter: Secondary | ICD-10-CM

## 2018-10-11 DIAGNOSIS — Z95828 Presence of other vascular implants and grafts: Secondary | ICD-10-CM

## 2018-10-11 LAB — CMP (CANCER CENTER ONLY)
ALT: 15 U/L (ref 0–44)
AST: 31 U/L (ref 15–41)
Albumin: 3.4 g/dL — ABNORMAL LOW (ref 3.5–5.0)
Alkaline Phosphatase: 61 U/L (ref 38–126)
Anion gap: 8 (ref 5–15)
BUN: 21 mg/dL (ref 8–23)
CO2: 24 mmol/L (ref 22–32)
Calcium: 9.4 mg/dL (ref 8.9–10.3)
Chloride: 108 mmol/L (ref 98–111)
Creatinine: 1.11 mg/dL — ABNORMAL HIGH (ref 0.44–1.00)
GFR, Est AFR Am: 58 mL/min — ABNORMAL LOW (ref >60)
GFR, Estimated: 50 mL/min — ABNORMAL LOW (ref >60)
Glucose, Bld: 138 mg/dL — ABNORMAL HIGH (ref 70–99)
Potassium: 3.8 mmol/L (ref 3.5–5.1)
Sodium: 140 mmol/L (ref 135–145)
Total Bilirubin: 0.5 mg/dL (ref 0.3–1.2)
Total Protein: 7 g/dL (ref 6.5–8.1)

## 2018-10-11 LAB — CBC WITH DIFFERENTIAL (CANCER CENTER ONLY)
Abs Immature Granulocytes: 0.01 10*3/uL (ref 0.00–0.07)
Basophils Absolute: 0 10*3/uL (ref 0.0–0.1)
Basophils Relative: 0 %
Eosinophils Absolute: 0.1 10*3/uL (ref 0.0–0.5)
Eosinophils Relative: 2 %
HCT: 30.6 % — ABNORMAL LOW (ref 36.0–46.0)
Hemoglobin: 10.1 g/dL — ABNORMAL LOW (ref 12.0–15.0)
Immature Granulocytes: 0 %
Lymphocytes Relative: 12 %
Lymphs Abs: 0.6 10*3/uL — ABNORMAL LOW (ref 0.7–4.0)
MCH: 33.7 pg (ref 26.0–34.0)
MCHC: 33 g/dL (ref 30.0–36.0)
MCV: 102 fL — ABNORMAL HIGH (ref 80.0–100.0)
Monocytes Absolute: 0.4 10*3/uL (ref 0.1–1.0)
Monocytes Relative: 9 %
Neutro Abs: 3.9 10*3/uL (ref 1.7–7.7)
Neutrophils Relative %: 77 %
Platelet Count: 246 10*3/uL (ref 150–400)
RBC: 3 MIL/uL — ABNORMAL LOW (ref 3.87–5.11)
RDW: 14.6 % (ref 11.5–15.5)
WBC Count: 5 10*3/uL (ref 4.0–10.5)
nRBC: 0 % (ref 0.0–0.2)

## 2018-10-11 MED ORDER — SODIUM CHLORIDE 0.9% FLUSH
10.0000 mL | Freq: Once | INTRAVENOUS | Status: AC
  Administered 2018-10-11: 10 mL
  Filled 2018-10-11: qty 10

## 2018-10-11 MED ORDER — SODIUM CHLORIDE 0.9 % IV SOLN
Freq: Once | INTRAVENOUS | Status: AC
  Administered 2018-10-11: 10:00:00 via INTRAVENOUS
  Filled 2018-10-11: qty 250

## 2018-10-11 MED ORDER — PROCHLORPERAZINE MALEATE 10 MG PO TABS
ORAL_TABLET | ORAL | Status: AC
  Filled 2018-10-11: qty 1

## 2018-10-11 MED ORDER — PROCHLORPERAZINE MALEATE 10 MG PO TABS
10.0000 mg | ORAL_TABLET | Freq: Once | ORAL | Status: AC
  Administered 2018-10-11: 10 mg via ORAL

## 2018-10-11 MED ORDER — SODIUM CHLORIDE 0.9% FLUSH
10.0000 mL | INTRAVENOUS | Status: DC | PRN
  Administered 2018-10-11: 10 mL
  Filled 2018-10-11: qty 10

## 2018-10-11 MED ORDER — HEPARIN SOD (PORK) LOCK FLUSH 100 UNIT/ML IV SOLN
500.0000 [IU] | Freq: Once | INTRAVENOUS | Status: AC | PRN
  Administered 2018-10-11: 500 [IU]
  Filled 2018-10-11: qty 5

## 2018-10-11 MED ORDER — SODIUM CHLORIDE 0.9 % IV SOLN
480.0000 mg/m2 | Freq: Once | INTRAVENOUS | Status: AC
  Administered 2018-10-11: 900 mg via INTRAVENOUS
  Filled 2018-10-11: qty 20

## 2018-10-11 NOTE — Telephone Encounter (Signed)
Last ov 3/2019Kennyth Lose- can you reach out to Pt and schedule virtual visit please?

## 2018-10-11 NOTE — Patient Instructions (Signed)
[facility] Discharge Instructions for Patients Receiving Chemotherapy  Today you received the following chemotherapy agents Alimta  To help prevent nausea and vomiting after your treatment, we encourage you to take your nausea medication as prescribed.   If you develop nausea and vomiting that is not controlled by your nausea medication, call the clinic.   BELOW ARE SYMPTOMS THAT SHOULD BE REPORTED IMMEDIATELY:  *FEVER GREATER THAN 100.5 F  *CHILLS WITH OR WITHOUT FEVER  NAUSEA AND VOMITING THAT IS NOT CONTROLLED WITH YOUR NAUSEA MEDICATION  *UNUSUAL SHORTNESS OF BREATH  *UNUSUAL BRUISING OR BLEEDING  TENDERNESS IN MOUTH AND THROAT WITH OR WITHOUT PRESENCE OF ULCERS  *URINARY PROBLEMS  *BOWEL PROBLEMS  UNUSUAL RASH Items with * indicate a potential emergency and should be followed up as soon as possible.  Feel free to call the clinic should you have any questions or concerns. The clinic phone number is [phone].  Please show the [facility] at check-in to the Emergency Department and triage nurse.  Coronavirus (COVID-19) Are you at risk?  Are you at risk for the Coronavirus (COVID-19)?  To be considered HIGH RISK for Coronavirus (COVID-19), you have to meet the following criteria:  . Traveled to [place], [place], [place], [place] or [place]; or in the [place] to [place], [place], [place], or [place]; and have fever, cough, and shortness of breath within the last 2 weeks of travel OR . Been in close contact with a person diagnosed with COVID-19 within the last 2 weeks and have fever, cough, and shortness of breath . IF YOU DO NOT MEET THESE CRITERIA, YOU ARE CONSIDERED LOW RISK FOR COVID-19.  What to do if you are HIGH RISK for COVID-19?  Marland Kitchen If you are having a medical emergency, call 911. . Seek medical care right away. Before you go to a doctor's office, urgent care or emergency department, call ahead and tell them about your recent  travel, contact with someone diagnosed with COVID-19, and your symptoms. You should receive instructions from your physician's office regarding next steps of care.  . When you arrive at healthcare provider, tell the healthcare staff immediately you have returned from visiting [place], [place], [place], [place] or [place]; or traveled in the [place] to [place], [place], [place], or [place]; in the last two weeks or you have been in close contact with a person diagnosed with COVID-19 in the last 2 weeks.   . Tell the health care staff about your symptoms: fever, cough and shortness of breath. . After you have been seen by a medical provider, you will be either: o Tested for (COVID-19) and discharged home on quarantine except to seek medical care if symptoms worsen, and asked to  - Stay home and avoid contact with others until you get your results (4-5 days)  - Avoid travel on public transportation if possible (such as bus, train, or airplane) or o Sent to the Emergency Department by EMS for evaluation, COVID-19 testing, and possible admission depending on your condition and test results.  What to do if you are LOW RISK for COVID-19?  Reduce your risk of any infection by using the same precautions used for avoiding the common cold or flu:  Marland Kitchen Wash your hands often with soap and warm water for at least 20 seconds.  If soap and water are not readily available, use an alcohol-based hand sanitizer with at least 60% alcohol.  . If coughing or sneezing,  cover your mouth and nose by coughing or sneezing into the elbow areas of your shirt or coat, into a tissue or into your sleeve (not your hands). . Avoid shaking hands with others and consider head nods or verbal greetings only. . Avoid touching your eyes, nose, or mouth with unwashed hands.  . Avoid close contact with people who are sick. . Avoid places or events with large numbers of people in one location, like concerts or sporting  events. . Carefully consider travel plans you have or are making. . If you are planning any travel outside or inside the [place], visit the CDC's Travelers' Health webpage for the latest health notices. . If you have some symptoms but not all symptoms, continue to monitor at home and seek medical attention if your symptoms worsen. . If you are having a medical emergency, call 911.   [facility] / e-Visit: [email or web address]         [facility]: [facility]: [phone]                   [facility]: [phone]

## 2018-10-11 NOTE — Telephone Encounter (Signed)
Scheduled appt per 5/6 los - added additional cycle to what was already scheduled - pt to get an updated schedule next visit.

## 2018-10-11 NOTE — Progress Notes (Signed)
[facility] Telephone:([phone]   Fax:(336) [place], MD [address] [address]  DIAGNOSIS: Stage IV (T1b, N2, M1b) non-small cell lung cancer, adenocarcinoma diagnosed in March 2017 and presented with right lower lobe lung nodule in addition to mediastinal lymphadenopathy and metastatic bone lesions.  Molecular studies: PDL 1 TPS  <1%.   Foundation One Studies: Positive for ERBB2 [id number]. Negative for EGFR, KRAS, ALK, BRAF, MET, RET and ROS1.  PRIOR THERAPY:  1) Induction systemic chemotherapy with carboplatin for AUC of 5 and Alimta 500 MG/M2 every 3 weeks is status post 6 cycles at the [facility], last dose was given 03/05/2016 with stable disease. 2) palliative radiation to the metastatic bone disease in the lower back and pelvic area.  CURRENT THERAPY::  1)  Maintenance systemic chemotherapy with Alimta 500 MG/M2 every 3 weeks status post 44 cycles. First dose was given 03/25/2016.  2) Zometa 4 mg IV every 12 week for metastatic bone disease.  INTERVAL HIST Valerie Long 73 y.o. female returns to the clinic today for follow-up visit.  The patient is feeling fine today with no concerning complaints except for the intermittent low back pain with radiation to the legs.  She denied having any current weight loss or night sweats.  She has no nausea, vomiting, diarrhea or constipation.  She denied having any headache or visual changes.  The patient denied having any fever or chills.  She has been tolerating her maintenance treatment with Alimta fairly well.  She is here for evaluation before starting cycle #45.   MEDICAL HISTORY: Past Medical History:  Diagnosis Date  . Adenocarcinoma of right lung, stage 4 ([facility]) 2017  . Anemia   . Arthritis   . Bone metastases ([facility]) 02/24/2017  . Encounter for antineoplastic chemotherapy 03/17/2016  . Hypercholesterolemia   . Hypertension  06/18/2016  . Osteopenia   . 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 Pneumonia   . Shortness of breath dyspnea     ALLERGIES:  has No Known Allergies.  MEDICATIONS:  Current Outpatient Medications  Medication Sig Dispense Refill  . acetaminophen (TYLENOL) 500 MG tablet Take 1,000 mg by mouth every 6 (six) hours as needed for moderate pain or headache.    . baclofen (LIORESAL) 10 MG tablet Take 1 tablet (10 mg total) by mouth 3 (three) times daily. 30 each 0  . benzonatate (TESSALON) 100 MG capsule Take 1 capsule (100 mg total) by mouth 3 (three) times daily as needed for cough. 40 capsule 1  . Calcium Carb-Cholecalciferol (CALCIUM 600/VITAMIN D3 PO) Take by mouth.    . Cyanocobalamin (B-12 PO) Take 2 tablets by mouth daily.    Marland Kitchen dexamethasone (DECADRON) 4 MG tablet Take 71m by mouth twice daily the day before, of, and after chemo 40 tablet 1  . Fish Oil-Cholecalciferol (OMEGA-3 + VITAMIN D3 PO) Take 1 tablet by mouth daily.    . folic acid (FOLVITE) 1 MG tablet Take 1 tablet (1 mg total) by mouth daily. 90 tablet 0  . ketorolac (ACULAR) 0.5 % ophthalmic solution Place 1 drop into both eyes 4 (four) times daily.  0  . lidocaine-prilocaine (EMLA) cream Apply 1 application topically as needed. Apply 1-2 tsp over port site 1-2 hours prior to chemo. 30 g 0  . meloxicam (MOBIC) 15 MG tablet TAKE 1 TABLET BY MOUTH EVERY DAY  AS NEEDED FOR PAIN 30 tablet 0  . neomycin-polymyxin b-dexamethasone (MAXITROL) 3.5-10000-0.1 SUSP INSTILL 1 DROP BY OPHTHALMIC ROUTE 4 TIMES PER DAY INTO BOTH EYES.  1  . ofloxacin (OCUFLOX) 0.3 % ophthalmic solution Place 1 drop into both eyes 4 (four) times daily.  0  . ondansetron (ZOFRAN) 8 MG tablet Take 8 mg by mouth every 8 (eight) hours as needed.    Marland Kitchen OVER THE COUNTER MEDICATION Apply 1 drop topically daily as needed. 1 drop CBD oil in vicks vapor rub . Applied to right thigh for pain relief.    Marland Kitchen oxyCODONE-acetaminophen (PERCOCET/ROXICET) 5-325 MG  tablet Take 1 tablet by mouth every 8 (eight) hours as needed for severe pain. (Patient not taking: Reported on 06/08/2018) 30 tablet 0  . prednisoLONE acetate (PRED FORTE) 1 % ophthalmic suspension Place 1 drop into both eyes 4 (four) times daily.  0  . PRESCRIPTION MEDICATION Inject 1 Dose as directed every 21 ( twenty-one) days. Chemo    . prochlorperazine (COMPAZINE) 10 MG tablet Take 1 tablet (10 mg total) by mouth every 6 (six) hours as needed for nausea or vomiting. (Patient not taking: Reported on 06/08/2018) 30 tablet 0   No current facility-administered medications for this visit.     SURGICAL HISTORY:  Past Surgical History:  Procedure Laterality Date  . CESAREAN SECTION     myomectomy  . COLONOSCOPY W/ POLYPECTOMY    . IR GENERIC HISTORICAL  08/17/2016   IR US GUIDE VASC ACCESS RIGHT 08/17/2016 [name], MD [facility]  . IR GENERIC HISTORICAL  08/17/2016   IR FLUORO GUIDE PORT INSERTION RIGHT 08/17/2016 [name], MD [facility]  . MEDIASTINOSCOPY N/A 09/25/2015   Procedure: MEDIASTINOSCOPY;  Surgeon: Melrose Nakayama, MD;  Location: [facility];  Service: Thoracic;  Laterality: N/A;  . VIDEO BRONCHOSCOPY Bilateral 08/19/2015   Procedure: VIDEO BRONCHOSCOPY WITH FLUORO;  Surgeon: Rigoberto Noel, MD;  Location: [facility];  Service: Cardiopulmonary;  Laterality: Bilateral;  . VIDEO BRONCHOSCOPY N/A 09/08/2015   Procedure: VIDEO BRONCHOSCOPY WITH FLUORO;  Surgeon: Rigoberto Noel, MD;  Location: [facility];  Service: Thoracic;  Laterality: N/A;  . VIDEO BRONCHOSCOPY WITH ENDOBRONCHIAL ULTRASOUND Right 09/08/2015   Procedure: ATTEMPTED VIDEO BRONCHOSCOPY ENDOBRONCHIAL ULTRASOUND  ;  Surgeon: Rigoberto Noel, MD;  Location: [facility];  Service: Thoracic;  Laterality: Right;    REVIEW OF SYSTEMS:  A comprehensive review of systems was negative except for: Constitutional: positive for fatigue Musculoskeletal: positive for back pain   PHYSICAL EXAMINATION: General appearance: alert,  cooperative, fatigued and no distress Head: Normocephalic, without obvious abnormality, atraumatic Neck: no adenopathy, no JVD, supple, symmetrical, trachea midline and thyroid not enlarged, symmetric, no tenderness/mass/nodules Lymph nodes: Cervical, supraclavicular, and axillary nodes normal. Resp: clear to auscultation bilaterally Back: symmetric, no curvature. ROM normal. No CVA tenderness. Cardio: regular rate and rhythm, S1, S2 normal, no murmur, click, rub or gallop GI: soft, non-tender; bowel sounds normal; no masses,  no organomegaly Extremities: extremities normal, atraumatic, no cyanosis or edema  ECOG PERFORMANCE STATUS: 1 - Symptomatic but completely ambulatory  Blood pressure (!) 148/89, pulse 75, temperature 98.2 F (36.8 C), temperature source Oral, resp. rate 20, height '5\' 9"'  (1.753 m), weight 147 lb 9.6 oz (67 kg), last menstrual period 06/09/1998, SpO2 100 %.  LABORATORY DATA: Lab Results  Component Value Date   WBC 5.0 10/11/2018   HGB 10.1 (L) 10/11/2018   HCT 30.6 (L) 10/11/2018   MCV 102.0 (H) 10/11/2018   PLT  246 10/11/2018      Chemistry      Component Value Date/Time   NA 140 09/20/2018 0912   NA 137 06/09/2017 0901   K 3.9 09/20/2018 0912   K 3.9 06/09/2017 0901   CL 106 09/20/2018 0912   CO2 24 09/20/2018 0912   CO2 25 06/09/2017 0901   BUN 18 09/20/2018 0912   BUN 19.0 06/09/2017 0901   CREATININE 1.07 (H) 09/20/2018 0912   CREATININE 1.2 (H) 06/09/2017 0901   GLU 170 01/23/2016      Component Value Date/Time   CALCIUM 9.2 09/20/2018 0912   CALCIUM 9.9 06/09/2017 0901   ALKPHOS 61 09/20/2018 0912   ALKPHOS 63 06/09/2017 0901   AST 31 09/20/2018 0912   AST 35 (H) 06/09/2017 0901   ALT 13 09/20/2018 0912   ALT 23 06/09/2017 0901   BILITOT 0.5 09/20/2018 0912   BILITOT 0.65 06/09/2017 0901       RADIOGRAPHIC STUDIES: No results found.  ASSESSMENT AND PLAN:  This is a very pleasant 73 years old Hispanic female with metastatic  non-small cell lung cancer, adenocarcinoma status post induction systemic chemotherapy with carboplatin and Alimta and she is currently on maintenance treatment with single agent Alimta status post 44 cycles. The patient continues to tolerate this treatment well with no concerning adverse effects. I recommended for her to proceed with cycle #45 today as scheduled. I will see the patient back for follow-up visit in 3 weeks for evaluation before the next cycle of her treatment. For the low back pain, she will continue her current treatment with Percocet as needed. The patient was advised to call if she has any concerning symptoms in the interval. The patient voices understanding of current disease status and treatment options and is in agreement with the current care plan. All questions were answered. The patient knows to call the clinic with any problems, questions or concerns. We can certainly see the patient much sooner if necessary.  Disclaimer: This note was dictated with voice recognition software. Similar sounding words can inadvertently be transcribed and may not be corrected upon review.

## 2018-10-13 NOTE — Telephone Encounter (Signed)
Pt scheduled appt on 10-23-2018. Done.

## 2018-10-23 ENCOUNTER — Telehealth: Payer: Self-pay | Admitting: Internal Medicine

## 2018-10-23 ENCOUNTER — Other Ambulatory Visit: Payer: Self-pay

## 2018-10-23 ENCOUNTER — Ambulatory Visit (INDEPENDENT_AMBULATORY_CARE_PROVIDER_SITE_OTHER): Payer: Medicare Other | Admitting: Internal Medicine

## 2018-10-23 DIAGNOSIS — M545 Low back pain, unspecified: Secondary | ICD-10-CM

## 2018-10-23 DIAGNOSIS — M5416 Radiculopathy, lumbar region: Secondary | ICD-10-CM

## 2018-10-23 DIAGNOSIS — G8929 Other chronic pain: Secondary | ICD-10-CM | POA: Diagnosis not present

## 2018-10-23 DIAGNOSIS — R269 Unspecified abnormalities of gait and mobility: Secondary | ICD-10-CM | POA: Diagnosis not present

## 2018-10-23 MED ORDER — BACLOFEN 10 MG PO TABS: 10.0000 mg | ORAL_TABLET | Freq: Three times a day (TID) | ORAL | 0 refills | Status: DC

## 2018-10-23 NOTE — Telephone Encounter (Signed)
Called pt and informed pt Handicap Placard ready to pick up at front desk.

## 2018-10-23 NOTE — Progress Notes (Signed)
Subjective:    Patient ID: Valerie Long, female    DOB: Nov 02, 1945, 73 y.o.   MRN: [id number]  DOS:  10/23/2018 Type of visit - description: Attempted  to make this a video visit, due to technical difficulties from the patient side it was not possible  thus we proceeded with a Virtual Visit via Telephone    I connected with@ on 10/24/18 at  1:00 PM EDT by telephone and verified that I am speaking with the correct person using two identifiers.  THIS ENCOUNTER IS A VIRTUAL VISIT DUE TO COVID-19 - PATIENT WAS NOT SEEN IN THE OFFICE. PATIENT HAS CONSENTED TO VIRTUAL VISIT / TELEMEDICINE VISIT   Location of patient: home  Location of provider: office  I discussed the limitations, risks, security and privacy concerns of performing an evaluation and management service by telephone and the availability of in person appointments. I also discussed with the patient that there may be a patient responsible charge related to this service. The patient expressed understanding and agreed to proceed.   History of Present Illness:History of Present Illness: Acute: The patient's main concern today is low back pain, this is going on for a year or more, radiates to the left leg up to the knee. The pain is steady, increased when she walks or sits down. Additionally, she also has more left leg distal symptoms described as cramps or needles. Pain is limiting her gait.  She responded well to pain medication.  Lung cancer: Noted from cardiology reviewed  Elevated BP: BP is slightly elevated when she sees oncology  BP Readings from Last 3 Encounters:  10/11/18 (!) 148/89  09/20/18 136/81  08/30/18 (!) 149/70     Review of Systems Denies fever chills No cough No bladder or bowel incontinence Occasional lower extremity edema   Past Medical History:  Diagnosis Date  . Adenocarcinoma of right lung, stage 4 ([facility]) 2017  . Anemia   . Arthritis   . Bone metastases ([facility]) 02/24/2017  . Encounter for  antineoplastic chemotherapy 03/17/2016  . Hypercholesterolemia   . Hypertension 06/18/2016  . Osteopenia   . 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 Pneumonia   . Shortness of breath dyspnea     Past Surgical History:  Procedure Laterality Date  . CESAREAN SECTION     myomectomy  . COLONOSCOPY W/ POLYPECTOMY    . IR GENERIC HISTORICAL  08/17/2016   IR US GUIDE VASC ACCESS RIGHT 08/17/2016 [name], MD [facility]  . IR GENERIC HISTORICAL  08/17/2016   IR FLUORO GUIDE PORT INSERTION RIGHT 08/17/2016 [name], MD [facility]  . MEDIASTINOSCOPY N/A 09/25/2015   Procedure: MEDIASTINOSCOPY;  Surgeon: Melrose Nakayama, MD;  Location: [facility];  Service: Thoracic;  Laterality: N/A;  . VIDEO BRONCHOSCOPY Bilateral 08/19/2015   Procedure: VIDEO BRONCHOSCOPY WITH FLUORO;  Surgeon: Rigoberto Noel, MD;  Location: [facility];  Service: Cardiopulmonary;  Laterality: Bilateral;  . VIDEO BRONCHOSCOPY N/A 09/08/2015   Procedure: VIDEO BRONCHOSCOPY WITH FLUORO;  Surgeon: Rigoberto Noel, MD;  Location: [facility];  Service: Thoracic;  Laterality: N/A;  . VIDEO BRONCHOSCOPY WITH ENDOBRONCHIAL ULTRASOUND Right 09/08/2015   Procedure: ATTEMPTED VIDEO BRONCHOSCOPY ENDOBRONCHIAL ULTRASOUND  ;  Surgeon: Rigoberto Noel, MD;  Location: [facility];  Service: Thoracic;  Laterality: Right;    Social History   Socioeconomic History  . Marital status: Legally Separated    Spouse name: Not on file  . Number of children: 1  .  Years of education: Not on file  . Highest education level: Not on file  Occupational History  . Occupation: retired     Fish farm manager: SELF EMPLOYED  Social Needs  . Financial resource strain: Not on file  . Food insecurity:    Worry: Not on file    Inability: Not on file  . Transportation needs:    Medical: Not on file    Non-medical: Not on file  Tobacco Use  . Smoking status: Never Smoker  . Smokeless tobacco: Never Used  Substance and Sexual Activity  . Alcohol  use: Yes    Alcohol/week: 0.0 standard drinks    Comment: SOCIAL  . Drug use: No  . Sexual activity: Never  Lifestyle  . Physical activity:    Days per week: Not on file    Minutes per session: Not on file  . Stress: Not on file  Relationships  . Social connections:    Talks on phone: Not on file    Gets together: Not on file    Attends religious service: Not on file    Active member of club or organization: Not on file    Attends meetings of clubs or organizations: Not on file    Relationship status: Not on file  . Intimate partner violence:    Fear of current or ex partner: Not on file    Emotionally abused: Not on file    Physically abused: Not on file    Forced sexual activity: Not on file  Other Topics Concern  . Not on file  Social History Narrative   From [place], moved from [place]  to [place] 2008   Lives w/ her mother and her son       Allergies as of 10/23/2018   No Known Allergies     Medication List       Accurate as of Oct 23, 2018 11:59 PM. If you have any questions, ask your nurse or doctor.        STOP taking these medications   ketorolac 0.5 % ophthalmic solution Commonly known as:  ACULAR Stopped by:  [name], MD   neomycin-polymyxin b-dexamethasone 3.5-10000-0.1 Susp Commonly known as:  MAXITROL Stopped by:  [name], MD   ofloxacin 0.3 % ophthalmic solution Commonly known as:  OCUFLOX Stopped by:  [name], MD   prednisoLONE acetate 1 % ophthalmic suspension Commonly known as:  PRED FORTE Stopped by:  [name], MD     TAKE these medications   acetaminophen 500 MG tablet Commonly known as:  TYLENOL Take 1,000 mg by mouth every 6 (six) hours as needed for moderate pain or headache.   B-12 PO Take 2 tablets by mouth daily.   baclofen 10 MG tablet Commonly known as:  LIORESAL Take 1 tablet (10 mg total) by mouth 3 (three) times daily.   benzonatate 100 MG capsule Commonly known as:  TESSALON Take 1 capsule (100 mg total)  by mouth 3 (three) times daily as needed for cough.   CALCIUM 600/VITAMIN D3 PO Take by mouth.   dexamethasone 4 MG tablet Commonly known as:  DECADRON Take 4mg  by mouth twice daily the day before, of, and after chemo   folic acid 1 MG tablet Commonly known as:  FOLVITE Take 1 tablet (1 mg total) by mouth daily.   lidocaine-prilocaine cream Commonly known as:  EMLA Apply 1 application topically as needed. Apply 1-2 tsp over port site 1-2 hours prior to chemo.   meloxicam  15 MG tablet Commonly known as:  MOBIC TAKE 1 TABLET BY MOUTH EVERY DAY AS NEEDED FOR PAIN   OMEGA-3 + VITAMIN D3 PO Take 1 tablet by mouth daily.   ondansetron 8 MG tablet Commonly known as:  ZOFRAN Take 8 mg by mouth every 8 (eight) hours as needed.   OVER THE COUNTER MEDICATION Apply 1 drop topically daily as needed. 1 drop CBD oil in vicks vapor rub . Applied to right thigh for pain relief.   oxyCODONE-acetaminophen 5-325 MG tablet Commonly known as:  PERCOCET/ROXICET Take 1 tablet by mouth every 8 (eight) hours as needed for severe pain.   PRESCRIPTION MEDICATION Inject 1 Dose as directed every 21 ( twenty-one) days. Chemo   prochlorperazine 10 MG tablet Commonly known as:  COMPAZINE Take 1 tablet (10 mg total) by mouth every 6 (six) hours as needed for nausea or vomiting.           Objective:   Physical Exam LMP 06/09/1998  This is a virtual phone visit, patient is alert oriented x3, in no acute distress    Assessment     Assessment  Hyperlipidemia Osteopenia Left pelvic kidney -- by CT done at [place]  Abnormal CT chest, very suspicious for  Lung Ca, scheduled mediastinoscopy 09/25/2015 Non-small cell lung cancer-Stage IV (T1b, N2, M1b) . Dx  March 2017  PLAN: Back pain: The patient reports chronic back pain with radiation to the proximal left leg.  On chart review, she has well-known spine blastic lesions from lung cancer, most recent imaging is CT abdomen and pelvis  from 08/28/2018 showing stable bone findings. On clinical grounds, symptoms seem to be radicular.  Will send to neurology for confirmation ([facility]?) and see if further evaluation is needed or treatment is possible (although none pain medication).   In the meantime she will continue with pain medications, she has meloxicam which she takes from time to time, I reinforced GI precautions. I also sent a refill on baclofen that she takes on prn basis  Gait disorder: She is having a lot of pain from the back, will provide a handicap sticker Non-small cell lung cancer: Last visit with oncology 10/11/2018, tolerating chemotherapy well. She will call in a couple of months to get a appointment for general checkup.    I discussed the assessment and treatment plan with the patient. The patient was provided an opportunity to ask questions and all were answered. The patient agreed with the plan and demonstrated an understanding of the instructions.   The patient was advised to call back or seek an in-person evaluation if the symptoms worsen or if the condition fails to improve as anticipated.  I provided 20 minutes of non-face-to-face time during this encounter.  [name], MD

## 2018-10-24 NOTE — Assessment & Plan Note (Signed)
Back pain: The patient reports chronic back pain with radiation to the proximal left leg.  On chart review, she has well-known spine blastic lesions from lung cancer, most recent imaging is CT abdomen and pelvis from 08/28/2018 showing stable bone findings. On clinical grounds, symptoms seem to be radicular.  Will send to neurology for confirmation ([facility]?) and see if further evaluation is needed or treatment is possible (although none pain medication).   In the meantime she will continue with pain medications, she has meloxicam which she takes from time to time, I reinforced GI precautions. I also sent a refill on baclofen that she takes on prn basis  Gait disorder: She is having a lot of pain from the back, will provide a handicap sticker Non-small cell lung cancer: Last visit with oncology 10/11/2018, tolerating chemotherapy well. She will call in a couple of months to get a appointment for general checkup.

## 2018-11-01 ENCOUNTER — Other Ambulatory Visit: Payer: Self-pay

## 2018-11-01 ENCOUNTER — Inpatient Hospital Stay: Payer: Medicare Other

## 2018-11-01 ENCOUNTER — Encounter: Payer: Self-pay | Admitting: Internal Medicine

## 2018-11-01 ENCOUNTER — Inpatient Hospital Stay (HOSPITAL_BASED_OUTPATIENT_CLINIC_OR_DEPARTMENT_OTHER): Payer: Medicare Other | Admitting: Internal Medicine

## 2018-11-01 VITALS — BP 147/75 | HR 70 | Temp 99.2°F | Resp 18 | Ht 69.0 in | Wt 146.8 lb

## 2018-11-01 DIAGNOSIS — C7951 Secondary malignant neoplasm of bone: Secondary | ICD-10-CM | POA: Diagnosis not present

## 2018-11-01 DIAGNOSIS — C3431 Malignant neoplasm of lower lobe, right bronchus or lung: Secondary | ICD-10-CM | POA: Diagnosis not present

## 2018-11-01 DIAGNOSIS — Z5111 Encounter for antineoplastic chemotherapy: Secondary | ICD-10-CM

## 2018-11-01 DIAGNOSIS — Z95828 Presence of other vascular implants and grafts: Secondary | ICD-10-CM

## 2018-11-01 DIAGNOSIS — R5383 Other fatigue: Secondary | ICD-10-CM | POA: Diagnosis not present

## 2018-11-01 DIAGNOSIS — C3491 Malignant neoplasm of unspecified part of right bronchus or lung: Secondary | ICD-10-CM

## 2018-11-01 DIAGNOSIS — M545 Low back pain: Secondary | ICD-10-CM

## 2018-11-01 LAB — CBC WITH DIFFERENTIAL (CANCER CENTER ONLY)
Abs Immature Granulocytes: 0.04 10*3/uL (ref 0.00–0.07)
Basophils Absolute: 0 10*3/uL (ref 0.0–0.1)
Basophils Relative: 0 %
Eosinophils Absolute: 0.2 10*3/uL (ref 0.0–0.5)
Eosinophils Relative: 3 %
HCT: 31.7 % — ABNORMAL LOW (ref 36.0–46.0)
Hemoglobin: 10.4 g/dL — ABNORMAL LOW (ref 12.0–15.0)
Immature Granulocytes: 1 %
Lymphocytes Relative: 13 %
Lymphs Abs: 0.9 10*3/uL (ref 0.7–4.0)
MCH: 33.7 pg (ref 26.0–34.0)
MCHC: 32.8 g/dL (ref 30.0–36.0)
MCV: 102.6 fL — ABNORMAL HIGH (ref 80.0–100.0)
Monocytes Absolute: 0.7 10*3/uL (ref 0.1–1.0)
Monocytes Relative: 11 %
Neutro Abs: 4.9 10*3/uL (ref 1.7–7.7)
Neutrophils Relative %: 72 %
Platelet Count: 262 10*3/uL (ref 150–400)
RBC: 3.09 MIL/uL — ABNORMAL LOW (ref 3.87–5.11)
RDW: 14.4 % (ref 11.5–15.5)
WBC Count: 6.7 10*3/uL (ref 4.0–10.5)
nRBC: 0 % (ref 0.0–0.2)

## 2018-11-01 LAB — CMP (CANCER CENTER ONLY)
ALT: 18 U/L (ref 0–44)
AST: 32 U/L (ref 15–41)
Albumin: 3.5 g/dL (ref 3.5–5.0)
Alkaline Phosphatase: 65 U/L (ref 38–126)
Anion gap: 8 (ref 5–15)
BUN: 24 mg/dL — ABNORMAL HIGH (ref 8–23)
CO2: 27 mmol/L (ref 22–32)
Calcium: 9.9 mg/dL (ref 8.9–10.3)
Chloride: 104 mmol/L (ref 98–111)
Creatinine: 1.18 mg/dL — ABNORMAL HIGH (ref 0.44–1.00)
GFR, Est AFR Am: 54 mL/min — ABNORMAL LOW (ref >60)
GFR, Estimated: 46 mL/min — ABNORMAL LOW (ref >60)
Glucose, Bld: 97 mg/dL (ref 70–99)
Potassium: 4.3 mmol/L (ref 3.5–5.1)
Sodium: 139 mmol/L (ref 135–145)
Total Bilirubin: 0.4 mg/dL (ref 0.3–1.2)
Total Protein: 7.1 g/dL (ref 6.5–8.1)

## 2018-11-01 MED ORDER — SODIUM CHLORIDE 0.9 % IV SOLN
480.0000 mg/m2 | Freq: Once | INTRAVENOUS | Status: AC
  Administered 2018-11-01: 900 mg via INTRAVENOUS
  Filled 2018-11-01: qty 20

## 2018-11-01 MED ORDER — SODIUM CHLORIDE 0.9% FLUSH
10.0000 mL | Freq: Once | INTRAVENOUS | Status: AC
  Administered 2018-11-01: 10 mL
  Filled 2018-11-01: qty 10

## 2018-11-01 MED ORDER — HEPARIN SOD (PORK) LOCK FLUSH 100 UNIT/ML IV SOLN
500.0000 [IU] | Freq: Once | INTRAVENOUS | Status: AC | PRN
  Administered 2018-11-01: 500 [IU]
  Filled 2018-11-01: qty 5

## 2018-11-01 MED ORDER — SODIUM CHLORIDE 0.9% FLUSH
10.0000 mL | INTRAVENOUS | Status: DC | PRN
  Administered 2018-11-01: 10 mL
  Filled 2018-11-01: qty 10

## 2018-11-01 MED ORDER — PROCHLORPERAZINE MALEATE 10 MG PO TABS
10.0000 mg | ORAL_TABLET | Freq: Once | ORAL | Status: AC
  Administered 2018-11-01: 12:00:00 10 mg via ORAL

## 2018-11-01 MED ORDER — CYANOCOBALAMIN 1000 MCG/ML IJ SOLN
1000.0000 ug | Freq: Once | INTRAMUSCULAR | Status: AC
  Administered 2018-11-01: 1000 ug via INTRAMUSCULAR

## 2018-11-01 MED ORDER — CYANOCOBALAMIN 1000 MCG/ML IJ SOLN
INTRAMUSCULAR | Status: AC
  Filled 2018-11-01: qty 1

## 2018-11-01 MED ORDER — PROCHLORPERAZINE MALEATE 10 MG PO TABS
ORAL_TABLET | ORAL | Status: AC
  Filled 2018-11-01: qty 1

## 2018-11-01 MED ORDER — SODIUM CHLORIDE 0.9 % IV SOLN
Freq: Once | INTRAVENOUS | Status: AC
  Administered 2018-11-01: 12:00:00 via INTRAVENOUS
  Filled 2018-11-01: qty 250

## 2018-11-01 NOTE — Patient Instructions (Signed)
[facility] Discharge Instructions for Patients Receiving Chemotherapy  Today you received the following chemotherapy agents Alimta  To help prevent nausea and vomiting after your treatment, we encourage you to take your nausea medication as prescribed.   If you develop nausea and vomiting that is not controlled by your nausea medication, call the clinic.   BELOW ARE SYMPTOMS THAT SHOULD BE REPORTED IMMEDIATELY:  *FEVER GREATER THAN 100.5 F  *CHILLS WITH OR WITHOUT FEVER  NAUSEA AND VOMITING THAT IS NOT CONTROLLED WITH YOUR NAUSEA MEDICATION  *UNUSUAL SHORTNESS OF BREATH  *UNUSUAL BRUISING OR BLEEDING  TENDERNESS IN MOUTH AND THROAT WITH OR WITHOUT PRESENCE OF ULCERS  *URINARY PROBLEMS  *BOWEL PROBLEMS  UNUSUAL RASH Items with * indicate a potential emergency and should be followed up as soon as possible.  Feel free to call the clinic should you have any questions or concerns. The clinic phone number is [phone].  Please show the [facility] at check-in to the Emergency Department and triage nurse.  Coronavirus (COVID-19) Are you at risk?  Are you at risk for the Coronavirus (COVID-19)?  To be considered HIGH RISK for Coronavirus (COVID-19), you have to meet the following criteria:  . Traveled to [place], [place], [place], [place] or [place]; or in the [place] to [place], [place], [place], or [place]; and have fever, cough, and shortness of breath within the last 2 weeks of travel OR . Been in close contact with a person diagnosed with COVID-19 within the last 2 weeks and have fever, cough, and shortness of breath . IF YOU DO NOT MEET THESE CRITERIA, YOU ARE CONSIDERED LOW RISK FOR COVID-19.  What to do if you are HIGH RISK for COVID-19?  Marland Kitchen If you are having a medical emergency, call 911. . Seek medical care right away. Before you go to a doctor's office, urgent care or emergency department, call ahead and tell them about your recent  travel, contact with someone diagnosed with COVID-19, and your symptoms. You should receive instructions from your physician's office regarding next steps of care.  . When you arrive at healthcare provider, tell the healthcare staff immediately you have returned from visiting [place], [place], [place], [place] or [place]; or traveled in the [place] to [place], [place], [place], or [place]; in the last two weeks or you have been in close contact with a person diagnosed with COVID-19 in the last 2 weeks.   . Tell the health care staff about your symptoms: fever, cough and shortness of breath. . After you have been seen by a medical provider, you will be either: o Tested for (COVID-19) and discharged home on quarantine except to seek medical care if symptoms worsen, and asked to  - Stay home and avoid contact with others until you get your results (4-5 days)  - Avoid travel on public transportation if possible (such as bus, train, or airplane) or o Sent to the Emergency Department by EMS for evaluation, COVID-19 testing, and possible admission depending on your condition and test results.  What to do if you are LOW RISK for COVID-19?  Reduce your risk of any infection by using the same precautions used for avoiding the common cold or flu:  Marland Kitchen Wash your hands often with soap and warm water for at least 20 seconds.  If soap and water are not readily available, use an alcohol-based hand sanitizer with at least 60% alcohol.  . If coughing or sneezing,  cover your mouth and nose by coughing or sneezing into the elbow areas of your shirt or coat, into a tissue or into your sleeve (not your hands). . Avoid shaking hands with others and consider head nods or verbal greetings only. . Avoid touching your eyes, nose, or mouth with unwashed hands.  . Avoid close contact with people who are sick. . Avoid places or events with large numbers of people in one location, like concerts or sporting  events. . Carefully consider travel plans you have or are making. . If you are planning any travel outside or inside the [place], visit the CDC's Travelers' Health webpage for the latest health notices. . If you have some symptoms but not all symptoms, continue to monitor at home and seek medical attention if your symptoms worsen. . If you are having a medical emergency, call 911.   [facility] / e-Visit: [email or web address]         [facility]: [facility]: [phone]                   [facility]: [phone]

## 2018-11-01 NOTE — Progress Notes (Signed)
[facility] [phone]   Fax:(336) [place], [place] [address] [place] [address]  DIAGNOSIS: Stage IV (T1b, N2, M1b) non-small cell lung cancer, adenocarcinoma diagnosed in March 2017 and presented with right lower lobe lung nodule in addition to mediastinal lymphadenopathy and metastatic bone lesions.  Molecular studies: PDL 1 TPS  <1%.   [facility] One Studies: Positive for ERBB2 [id number]. Negative for EGFR, KRAS, ALK, BRAF, MET, RET and ROS1.  PRIOR THERAPY:  1) Induction systemic chemotherapy with carboplatin for AUC of 5 and Alimta 500 MG/M2 every 3 weeks is status post 6 cycles at the [facility], last dose was given 03/05/2016 with stable disease. 2) palliative radiation to the metastatic bone disease in the lower back and pelvic area.  CURRENT THERAPY::  1)  Maintenance systemic chemotherapy with Alimta 500 MG/M2 every 3 weeks status post 45 cycles. First dose was given 03/25/2016.  2) Zometa 4 mg IV every 12 week for metastatic bone disease.  INTERVAL HIST Valerie Long 73 y.o. female returns to the clinic today for follow-up visit.  The patient is feeling fine today with no events except for the persistent low back pain.  She denied having any current chest pain, shortness of breath, cough or hemoptysis.  She denied having any fever or chills.  She has no nausea, vomiting, diarrhea or constipation.  She has no headache or visual changes.  She continues to tolerate her treatment with Alimta fairly well except for mild fatigue.  The patient is here today for evaluation before starting cycle #46.  MEDICAL HISTORY: Past Medical History:  Diagnosis Date  . Adenocarcinoma of right lung, stage 4 ([facility]) 2017  . Anemia   . Arthritis   . Bone metastases ([facility]) 02/24/2017  . Encounter for antineoplastic chemotherapy 03/17/2016  . Hypercholesterolemia   . Hypertension 06/18/2016  .  Osteopenia   . 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 Pneumonia   . Shortness of breath dyspnea     ALLERGIES:  has No Known Allergies.  MEDICATIONS:  Current Outpatient Medications  Medication Sig Dispense Refill  . acetaminophen (TYLENOL) 500 MG tablet Take 1,000 mg by mouth every 6 (six) hours as needed for moderate pain or headache.    . baclofen (LIORESAL) 10 MG tablet Take 1 tablet (10 mg total) by mouth 3 (three) times daily. 30 each 0  . benzonatate (TESSALON) 100 MG capsule Take 1 capsule (100 mg total) by mouth 3 (three) times daily as needed for cough. (Patient not taking: Reported on 10/23/2018) 40 capsule 1  . Calcium Carb-Cholecalciferol (CALCIUM 600/VITAMIN D3 PO) Take by mouth.    . Cyanocobalamin (B-12 PO) Take 2 tablets by mouth daily.    Marland Kitchen dexamethasone (DECADRON) 4 MG tablet Take 60m by mouth twice daily the day before, of, and after chemo 40 tablet 1  . Fish Oil-Cholecalciferol (OMEGA-3 + VITAMIN D3 PO) Take 1 tablet by mouth daily.    . folic acid (FOLVITE) 1 MG tablet Take 1 tablet (1 mg total) by mouth daily. 90 tablet 0  . lidocaine-prilocaine (EMLA) cream Apply 1 application topically as needed. Apply 1-2 tsp over port site 1-2 hours prior to chemo. 30 g 0  . meloxicam (MOBIC) 15 MG tablet TAKE 1 TABLET BY MOUTH EVERY DAY AS NEEDED FOR PAIN (Patient not taking: Reported on 10/23/2018) 30 tablet 0  .  ondansetron (ZOFRAN) 8 MG tablet Take 8 mg by mouth every 8 (eight) hours as needed.    Marland Kitchen OVER THE COUNTER MEDICATION Apply 1 drop topically daily as needed. 1 drop CBD oil in vicks vapor rub . Applied to right thigh for pain relief.    Marland Kitchen oxyCODONE-acetaminophen (PERCOCET/ROXICET) 5-325 MG tablet Take 1 tablet by mouth every 8 (eight) hours as needed for severe pain. (Patient not taking: Reported on 06/08/2018) 30 tablet 0  . PRESCRIPTION MEDICATION Inject 1 Dose as directed every 21 ( twenty-one) days. Chemo    . prochlorperazine (COMPAZINE) 10 MG tablet  Take 1 tablet (10 mg total) by mouth every 6 (six) hours as needed for nausea or vomiting. (Patient not taking: Reported on 06/08/2018) 30 tablet 0   No current facility-administered medications for this visit.     SURGICAL HISTORY:  Past Surgical History:  Procedure Laterality Date  . CESAREAN SECTION     myomectomy  . COLONOSCOPY W/ POLYPECTOMY    . IR GENERIC HISTORICAL  08/17/2016   IR US GUIDE VASC ACCESS RIGHT 08/17/2016 [name], MD [facility]  . IR GENERIC HISTORICAL  08/17/2016   IR FLUORO GUIDE PORT INSERTION RIGHT 08/17/2016 [name], MD [facility]  . MEDIASTINOSCOPY N/A 09/25/2015   Procedure: MEDIASTINOSCOPY;  Surgeon: Melrose Nakayama, MD;  Location: [facility];  Service: Thoracic;  Laterality: N/A;  . VIDEO BRONCHOSCOPY Bilateral 08/19/2015   Procedure: VIDEO BRONCHOSCOPY WITH FLUORO;  Surgeon: Rigoberto Noel, MD;  Location: [facility];  Service: Cardiopulmonary;  Laterality: Bilateral;  . VIDEO BRONCHOSCOPY N/A 09/08/2015   Procedure: VIDEO BRONCHOSCOPY WITH FLUORO;  Surgeon: Rigoberto Noel, MD;  Location: [facility];  Service: Thoracic;  Laterality: N/A;  . VIDEO BRONCHOSCOPY WITH ENDOBRONCHIAL ULTRASOUND Right 09/08/2015   Procedure: ATTEMPTED VIDEO BRONCHOSCOPY ENDOBRONCHIAL ULTRASOUND  ;  Surgeon: Rigoberto Noel, MD;  Location: [facility];  Service: Thoracic;  Laterality: Right;    REVIEW OF SYSTEMS:  A comprehensive review of systems was negative except for: Constitutional: positive for fatigue Musculoskeletal: positive for back pain   PHYSICAL EXAMINATION: General appearance: alert, cooperative, fatigued and no distress Head: Normocephalic, without obvious abnormality, atraumatic Neck: no adenopathy, no JVD, supple, symmetrical, trachea midline and thyroid not enlarged, symmetric, no tenderness/mass/nodules Lymph nodes: Cervical, supraclavicular, and axillary nodes normal. Resp: clear to auscultation bilaterally Back: symmetric, no curvature. ROM normal. No CVA  tenderness. Cardio: regular rate and rhythm, S1, S2 normal, no murmur, click, rub or gallop GI: soft, non-tender; bowel sounds normal; no masses,  no organomegaly Extremities: extremities normal, atraumatic, no cyanosis or edema  ECOG PERFORMANCE STATUS: 1 - Symptomatic but completely ambulatory  Blood pressure (!) 147/75, pulse 70, temperature 99.2 F (37.3 C), temperature source Oral, resp. rate 18, height '5\' 9"'  (1.753 m), weight 146 lb 12.8 oz (66.6 kg), last menstrual period 06/09/1998, SpO2 100 %.  LABORATORY DATA: Lab Results  Component Value Date   WBC 6.7 11/01/2018   HGB 10.4 (L) 11/01/2018   HCT 31.7 (L) 11/01/2018   MCV 102.6 (H) 11/01/2018   PLT 262 11/01/2018      Chemistry      Component Value Date/Time   NA 139 11/01/2018 1052   NA 137 06/09/2017 0901   K 4.3 11/01/2018 1052   K 3.9 06/09/2017 0901   CL 104 11/01/2018 1052   CO2 27 11/01/2018 1052   CO2 25 06/09/2017 0901   BUN 24 (H) 11/01/2018 1052   BUN 19.0 06/09/2017 0901  CREATININE 1.18 (H) 11/01/2018 1052   CREATININE 1.2 (H) 06/09/2017 0901   GLU 170 01/23/2016      Component Value Date/Time   CALCIUM 9.9 11/01/2018 1052   CALCIUM 9.9 06/09/2017 0901   ALKPHOS 65 11/01/2018 1052   ALKPHOS 63 06/09/2017 0901   AST 32 11/01/2018 1052   AST 35 (H) 06/09/2017 0901   ALT 18 11/01/2018 1052   ALT 23 06/09/2017 0901   BILITOT 0.4 11/01/2018 1052   BILITOT 0.65 06/09/2017 0901       RADIOGRAPHIC STUDIES: No results found.  ASSESSMENT AND PLAN:  This is a very pleasant 73 years old Hispanic female with metastatic non-small cell lung cancer, adenocarcinoma status post induction systemic chemotherapy with carboplatin and Alimta and she is currently on maintenance treatment with single agent Alimta status post 45 cycles. The patient continues to tolerate her treatment well with no concerning adverse effects except for mild fatigue. I recommended for her to proceed with cycle #46 today as planned.  I will see her back for follow-up visit in 3 weeks for evaluation before the next cycle of her treatment. For the low back pain, she will continue her current treatment with Percocet as needed. She was advised to call immediately if she has any concerning symptoms in the interval. The patient voices understanding of current disease status and treatment options and is in agreement with the current care plan. All questions were answered. The patient knows to call the clinic with any problems, questions or concerns. We can certainly see the patient much sooner if necessary.  Disclaimer: This note was dictated with voice recognition software. Similar sounding words can inadvertently be transcribed and may not be corrected upon review.

## 2018-11-08 ENCOUNTER — Encounter: Payer: Self-pay | Admitting: Neurology

## 2018-11-08 ENCOUNTER — Other Ambulatory Visit: Payer: Self-pay

## 2018-11-08 DIAGNOSIS — M5416 Radiculopathy, lumbar region: Secondary | ICD-10-CM

## 2018-11-08 NOTE — Progress Notes (Signed)
e

## 2018-11-22 ENCOUNTER — Encounter: Payer: Self-pay | Admitting: Physician Assistant

## 2018-11-22 ENCOUNTER — Inpatient Hospital Stay: Payer: Medicare Other | Attending: Internal Medicine

## 2018-11-22 ENCOUNTER — Inpatient Hospital Stay (HOSPITAL_BASED_OUTPATIENT_CLINIC_OR_DEPARTMENT_OTHER): Payer: Medicare Other | Admitting: Physician Assistant

## 2018-11-22 ENCOUNTER — Other Ambulatory Visit: Payer: Self-pay

## 2018-11-22 ENCOUNTER — Inpatient Hospital Stay: Payer: Medicare Other

## 2018-11-22 VITALS — BP 136/81 | HR 89 | Temp 98.9°F | Resp 18 | Ht 69.0 in | Wt 146.8 lb

## 2018-11-22 DIAGNOSIS — C3431 Malignant neoplasm of lower lobe, right bronchus or lung: Secondary | ICD-10-CM | POA: Diagnosis not present

## 2018-11-22 DIAGNOSIS — C7951 Secondary malignant neoplasm of bone: Secondary | ICD-10-CM

## 2018-11-22 DIAGNOSIS — Z5111 Encounter for antineoplastic chemotherapy: Secondary | ICD-10-CM | POA: Insufficient documentation

## 2018-11-22 DIAGNOSIS — C3491 Malignant neoplasm of unspecified part of right bronchus or lung: Secondary | ICD-10-CM

## 2018-11-22 DIAGNOSIS — R5383 Other fatigue: Secondary | ICD-10-CM

## 2018-11-22 DIAGNOSIS — D649 Anemia, unspecified: Secondary | ICD-10-CM

## 2018-11-22 DIAGNOSIS — M545 Low back pain: Secondary | ICD-10-CM | POA: Diagnosis not present

## 2018-11-22 DIAGNOSIS — Z95828 Presence of other vascular implants and grafts: Secondary | ICD-10-CM

## 2018-11-22 LAB — CMP (CANCER CENTER ONLY)
ALT: 14 U/L (ref 0–44)
AST: 30 U/L (ref 15–41)
Albumin: 3.5 g/dL (ref 3.5–5.0)
Alkaline Phosphatase: 64 U/L (ref 38–126)
Anion gap: 10 (ref 5–15)
BUN: 20 mg/dL (ref 8–23)
CO2: 24 mmol/L (ref 22–32)
Calcium: 9.8 mg/dL (ref 8.9–10.3)
Chloride: 106 mmol/L (ref 98–111)
Creatinine: 1.16 mg/dL — ABNORMAL HIGH (ref 0.44–1.00)
GFR, Est AFR Am: 55 mL/min — ABNORMAL LOW (ref >60)
GFR, Estimated: 47 mL/min — ABNORMAL LOW (ref >60)
Glucose, Bld: 96 mg/dL (ref 70–99)
Potassium: 4 mmol/L (ref 3.5–5.1)
Sodium: 140 mmol/L (ref 135–145)
Total Bilirubin: 0.4 mg/dL (ref 0.3–1.2)
Total Protein: 7.1 g/dL (ref 6.5–8.1)

## 2018-11-22 LAB — CBC WITH DIFFERENTIAL (CANCER CENTER ONLY)
Abs Immature Granulocytes: 0.02 10*3/uL (ref 0.00–0.07)
Basophils Absolute: 0 10*3/uL (ref 0.0–0.1)
Basophils Relative: 0 %
Eosinophils Absolute: 0.2 10*3/uL (ref 0.0–0.5)
Eosinophils Relative: 2 %
HCT: 31.6 % — ABNORMAL LOW (ref 36.0–46.0)
Hemoglobin: 10.5 g/dL — ABNORMAL LOW (ref 12.0–15.0)
Immature Granulocytes: 0 %
Lymphocytes Relative: 9 %
Lymphs Abs: 0.6 10*3/uL — ABNORMAL LOW (ref 0.7–4.0)
MCH: 33.8 pg (ref 26.0–34.0)
MCHC: 33.2 g/dL (ref 30.0–36.0)
MCV: 101.6 fL — ABNORMAL HIGH (ref 80.0–100.0)
Monocytes Absolute: 0.6 10*3/uL (ref 0.1–1.0)
Monocytes Relative: 9 %
Neutro Abs: 5.6 10*3/uL (ref 1.7–7.7)
Neutrophils Relative %: 80 %
Platelet Count: 267 10*3/uL (ref 150–400)
RBC: 3.11 MIL/uL — ABNORMAL LOW (ref 3.87–5.11)
RDW: 14.1 % (ref 11.5–15.5)
WBC Count: 7 10*3/uL (ref 4.0–10.5)
nRBC: 0 % (ref 0.0–0.2)

## 2018-11-22 MED ORDER — SODIUM CHLORIDE 0.9% FLUSH
10.0000 mL | Freq: Once | INTRAVENOUS | Status: AC
  Administered 2018-11-22: 10 mL
  Filled 2018-11-22: qty 10

## 2018-11-22 MED ORDER — SODIUM CHLORIDE 0.9 % IV SOLN
480.0000 mg/m2 | Freq: Once | INTRAVENOUS | Status: AC
  Administered 2018-11-22: 900 mg via INTRAVENOUS
  Filled 2018-11-22: qty 20

## 2018-11-22 MED ORDER — SODIUM CHLORIDE 0.9% FLUSH
10.0000 mL | INTRAVENOUS | Status: DC | PRN
  Administered 2018-11-22: 10 mL
  Filled 2018-11-22: qty 10

## 2018-11-22 MED ORDER — PROCHLORPERAZINE MALEATE 10 MG PO TABS
10.0000 mg | ORAL_TABLET | Freq: Once | ORAL | Status: AC
  Administered 2018-11-22: 10 mg via ORAL

## 2018-11-22 MED ORDER — PROCHLORPERAZINE MALEATE 10 MG PO TABS
ORAL_TABLET | ORAL | Status: AC
  Filled 2018-11-22: qty 1

## 2018-11-22 MED ORDER — SODIUM CHLORIDE 0.9 % IV SOLN
Freq: Once | INTRAVENOUS | Status: AC
  Administered 2018-11-22: 12:00:00 via INTRAVENOUS
  Filled 2018-11-22: qty 250

## 2018-11-22 MED ORDER — HEPARIN SOD (PORK) LOCK FLUSH 100 UNIT/ML IV SOLN
500.0000 [IU] | Freq: Once | INTRAVENOUS | Status: AC | PRN
  Administered 2018-11-22: 500 [IU]
  Filled 2018-11-22: qty 5

## 2018-11-22 NOTE — Patient Instructions (Signed)
[facility] Discharge Instructions for Patients Receiving Chemotherapy  Today you received the following chemotherapy agents Alimta  To help prevent nausea and vomiting after your treatment, we encourage you to take your nausea medication as directed.   If you develop nausea and vomiting that is not controlled by your nausea medication, call the clinic.   BELOW ARE SYMPTOMS THAT SHOULD BE REPORTED IMMEDIATELY:  *FEVER GREATER THAN 100.5 F  *CHILLS WITH OR WITHOUT FEVER  NAUSEA AND VOMITING THAT IS NOT CONTROLLED WITH YOUR NAUSEA MEDICATION  *UNUSUAL SHORTNESS OF BREATH  *UNUSUAL BRUISING OR BLEEDING  TENDERNESS IN MOUTH AND THROAT WITH OR WITHOUT PRESENCE OF ULCERS  *URINARY PROBLEMS  *BOWEL PROBLEMS  UNUSUAL RASH Items with * indicate a potential emergency and should be followed up as soon as possible.  Feel free to call the clinic should you have any questions or concerns. The clinic phone number is [phone].  Please show the [facility] at check-in to the Emergency Department and triage nurse.

## 2018-11-22 NOTE — Progress Notes (Signed)
[place], MD [address] [place] [address]  DIAGNOSIS: Stage IV (T1b, N2, M1b) non-small cell lung cancer, adenocarcinoma diagnosed in March 2017 and presented with right lower lobe lung nodule in addition to mediastinal lymphadenopathy and metastatic bone lesions.  Molecular studies:PDL 1 TPS <1%.   [employer] One Studies: Positive for ERBB2 [id number]. Negative for EGFR, KRAS, ALK, BRAF, MET, RET and ROS1.  PRIOR THERAPY: 1) Induction systemic chemotherapy with carboplatin for AUC of 5 and Alimta 500 MG/M2 every 3 weeks is status post 6 cycles at the [facility], last dose was given 03/05/2016 with stable disease. 2) palliative radiation to the metastatic bone disease in the lower back and pelvic area.  CURRENT THERAPY:  1)  Maintenance systemic chemotherapy with Alimta 500 MG/M2 every 3 weeks status post 45 cycles. First dose was given 03/25/2016.  2) Zometa 4 mg IV every 12 week for metastatic bone disease.  INTERVAL HISTORY: Valerie Long 73 y.o. female returns to the clinic for a follow-up visit.  The patient is doing well today without any concerning complaints except for continued back pain with an associated sharp radiating pain down her left leg.  The patient is scheduled to see neurology on 11/30/2018 regarding this issue.   Otherwise she continues to tolerate her treatment well without any adverse side effects.  She denies any fever, chills, night sweats, or weight loss.  She denies any chest pain, shortness of breath, cough, or hemoptysis.  She occasionally experiences mild nausea with rare occasions vomiting.  Denies any diarrhea or constipation.  She denies any headaches or visual changes.  She is here today for evaluation before starting cycle #47.  MEDICAL HISTORY: Past Medical History:  Diagnosis Date  . Adenocarcinoma of right lung, stage 4 ([place]) 2017  . Anemia   . Arthritis   .  Bone metastases ([place]) 02/24/2017  . Encounter for antineoplastic chemotherapy 03/17/2016  . Hypercholesterolemia   . Hypertension 06/18/2016  . Osteopenia   . 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 Pneumonia   . Shortness of breath dyspnea     ALLERGIES:  has No Known Allergies.  MEDICATIONS:  Current Outpatient Medications  Medication Sig Dispense Refill  . acetaminophen (TYLENOL) 500 MG tablet Take 1,000 mg by mouth every 6 (six) hours as needed for moderate pain or headache.    . Calcium Carb-Cholecalciferol (CALCIUM 600/VITAMIN D3 PO) Take by mouth.    . dexamethasone (DECADRON) 4 MG tablet Take 44m by mouth twice daily the day before, of, and after chemo 40 tablet 1  . folic acid (FOLVITE) 1 MG tablet Take 1 tablet (1 mg total) by mouth daily. 90 tablet 0  . lidocaine-prilocaine (EMLA) cream Apply 1 application topically as needed. Apply 1-2 tsp over port site 1-2 hours prior to chemo. 30 g 0  . PRESCRIPTION MEDICATION Inject 1 Dose as directed every 21 ( twenty-one) days. Chemo    . baclofen (LIORESAL) 10 MG tablet Take 1 tablet (10 mg total) by mouth 3 (three) times daily. (Patient not taking: Reported on 11/22/2018) 30 each 0  . benzonatate (TESSALON) 100 MG capsule Take 1 capsule (100 mg total) by mouth 3 (three) times daily as needed for cough. (Patient not taking: Reported on 10/23/2018) 40 capsule 1  . Cyanocobalamin (B-12 PO) Take 2 tablets by mouth daily.    . Fish Oil-Cholecalciferol (OMEGA-3 + VITAMIN D3  PO) Take 1 tablet by mouth daily.    . meloxicam (MOBIC) 15 MG tablet TAKE 1 TABLET BY MOUTH EVERY DAY AS NEEDED FOR PAIN (Patient not taking: Reported on 10/23/2018) 30 tablet 0  . ondansetron (ZOFRAN) 8 MG tablet Take 8 mg by mouth every 8 (eight) hours as needed.    Marland Kitchen OVER THE COUNTER MEDICATION Apply 1 drop topically daily as needed. 1 drop CBD oil in vicks vapor rub . Applied to right thigh for pain relief.    Marland Kitchen oxyCODONE-acetaminophen (PERCOCET/ROXICET)  5-325 MG tablet Take 1 tablet by mouth every 8 (eight) hours as needed for severe pain. (Patient not taking: Reported on 06/08/2018) 30 tablet 0  . prochlorperazine (COMPAZINE) 10 MG tablet Take 1 tablet (10 mg total) by mouth every 6 (six) hours as needed for nausea or vomiting. (Patient not taking: Reported on 06/08/2018) 30 tablet 0   No current facility-administered medications for this visit.     SURGICAL HISTORY:  Past Surgical History:  Procedure Laterality Date  . CESAREAN SECTION     myomectomy  . COLONOSCOPY W/ POLYPECTOMY    . IR GENERIC HISTORICAL  08/17/2016   IR US GUIDE VASC ACCESS RIGHT 08/17/2016 [name], MD [facility]  . IR GENERIC HISTORICAL  08/17/2016   IR FLUORO GUIDE PORT INSERTION RIGHT 08/17/2016 [name], MD [facility]  . MEDIASTINOSCOPY N/A 09/25/2015   Procedure: MEDIASTINOSCOPY;  Surgeon: Melrose Nakayama, MD;  Location: [place];  Service: Thoracic;  Laterality: N/A;  . VIDEO BRONCHOSCOPY Bilateral 08/19/2015   Procedure: VIDEO BRONCHOSCOPY WITH FLUORO;  Surgeon: Rigoberto Noel, MD;  Location: [place];  Service: Cardiopulmonary;  Laterality: Bilateral;  . VIDEO BRONCHOSCOPY N/A 09/08/2015   Procedure: VIDEO BRONCHOSCOPY WITH FLUORO;  Surgeon: Rigoberto Noel, MD;  Location: [place];  Service: Thoracic;  Laterality: N/A;  . VIDEO BRONCHOSCOPY WITH ENDOBRONCHIAL ULTRASOUND Right 09/08/2015   Procedure: ATTEMPTED VIDEO BRONCHOSCOPY ENDOBRONCHIAL ULTRASOUND  ;  Surgeon: Rigoberto Noel, MD;  Location: [place];  Service: Thoracic;  Laterality: Right;    REVIEW OF SYSTEMS:   Review of Systems  Constitutional: Negative for appetite change, chills, fatigue, fever and unexpected weight change.  HENT:   Negative for mouth sores, nosebleeds, sore throat and trouble swallowing.   Eyes: Negative for eye problems and icterus.  Respiratory: Negative for cough, hemoptysis, shortness of breath and wheezing.   Cardiovascular: Negative for chest pain and leg swelling.   Gastrointestinal: Positive for mild nausea with infrequent vomiting. Negative for abdominal pain, constipation, or diarrhea.  Genitourinary: Negative for bladder incontinence, difficulty urinating, dysuria, frequency and hematuria.   Musculoskeletal: Positive for back pain with associated radiating pain down her left leg. Negative for gait problem, neck pain and neck stiffness.  Skin: Negative for itching and rash.  Neurological: Negative for dizziness, extremity weakness, gait problem, headaches, light-headedness and seizures.  Hematological: Negative for adenopathy. Does not bruise/bleed easily.  Psychiatric/Behavioral: Negative for confusion, depression and sleep disturbance. The patient is not nervous/anxious.     PHYSICAL EXAMINATION:  Blood pressure 136/81, pulse 89, temperature 98.9 F (37.2 C), temperature source Oral, resp. rate 18, height '5\' 9"'  (1.753 m), weight 146 lb 12.8 oz (66.6 kg), last menstrual period 06/09/1998, SpO2 97 %.  ECOG PERFORMANCE STATUS: 1 - Symptomatic but completely ambulatory  Physical Exam  Constitutional: Oriented to person, place, and time and well-developed, well-nourished, and in no distress.   HENT:  Head: Normocephalic and atraumatic.  Mouth/Throat: Oropharynx is clear and moist.  No oropharyngeal exudate.  Eyes: Conjunctivae are normal. Right eye exhibits no discharge. Left eye exhibits no discharge. No scleral icterus.  Neck: Normal range of motion. Neck supple.  Cardiovascular: Normal rate, regular rhythm, normal heart sounds and intact distal pulses.   Pulmonary/Chest: Effort normal and breath sounds normal. No respiratory distress. No wheezes. No rales.  Abdominal: Soft. Bowel sounds are normal. Exhibits no distension and no mass. There is no tenderness.  Musculoskeletal: Normal range of motion. Exhibits no edema.  Lymphadenopathy:    No cervical adenopathy.  Neurological: Alert and oriented to person, place, and time. Exhibits normal muscle  tone. Gait normal. Coordination normal.  Skin: Skin is warm and dry. No rash noted. Not diaphoretic. No erythema. No pallor.  Psychiatric: Mood, memory and judgment normal.  Vitals reviewed.  LABORATORY DATA: Lab Results  Component Value Date   WBC 7.0 11/22/2018   HGB 10.5 (L) 11/22/2018   HCT 31.6 (L) 11/22/2018   MCV 101.6 (H) 11/22/2018   PLT 267 11/22/2018      Chemistry      Component Value Date/Time   NA 139 11/01/2018 1052   NA 137 06/09/2017 0901   K 4.3 11/01/2018 1052   K 3.9 06/09/2017 0901   CL 104 11/01/2018 1052   CO2 27 11/01/2018 1052   CO2 25 06/09/2017 0901   BUN 24 (H) 11/01/2018 1052   BUN 19.0 06/09/2017 0901   CREATININE 1.18 (H) 11/01/2018 1052   CREATININE 1.2 (H) 06/09/2017 0901   GLU 170 01/23/2016      Component Value Date/Time   CALCIUM 9.9 11/01/2018 1052   CALCIUM 9.9 06/09/2017 0901   ALKPHOS 65 11/01/2018 1052   ALKPHOS 63 06/09/2017 0901   AST 32 11/01/2018 1052   AST 35 (H) 06/09/2017 0901   ALT 18 11/01/2018 1052   ALT 23 06/09/2017 0901   BILITOT 0.4 11/01/2018 1052   BILITOT 0.65 06/09/2017 0901       RADIOGRAPHIC STUDIES:  No results found.   ASSESSMENT/PLAN:  This is a very pleasant 73 year old Hispanic female diagnosed with metastatic non-small cell lung cancer, adenocarcinoma.  She presented with a right lower lobe lung nodule in addition to mediastinal lymphadenopathy and metastatic bone lesions.  She was diagnosed in March 2017.  Her PDL 1 expression is <1% and she has no actionable mutations.  She previously underwent induction treatment with systemic chemotherapy with carboplatin and Alimta.  He is currently undergoing maintenance therapy with Alimta.  She is status post 46 cycles.  The patient has been tolerating this treatment fairly well without any concerning adverse effects.   The patient was seen with Dr. Julien Nordmann today.  Labs were reviewed.  We recommend that she proceed with cycle #47 today scheduled.   I  will arrange for the patient to receive a restaging CT scan prior to her next visit.   We will see the patient back for follow-up visit in 3 weeks for evaluation and to review her scan results before starting cycle #48.  The patient will followed up with neurology next week regarding her back pain.   The patient will continue to take her folic acid daily and her Decadron the day before, the day of, and day after her treatment.   The patient received Zometa on 09/20/2018. She receives this every 12 weeks. She will be due for Zometa ~12/13/2018.   The patient was advised to call immediately if she has any concerning symptoms in the interval. The patient voices understanding  of current disease status and treatment options and is in agreement with the current care plan. All questions were answered. The patient knows to call the clinic with any problems, questions or concerns. We can certainly see the patient much sooner if necessary  Orders Placed This Encounter  Procedures  . CT Chest W Contrast    Standing Status:   Future    Standing Expiration Date:   11/22/2019    Order Specific Question:   ** REASON FOR EXAM (FREE TEXT)    Answer:   Restaging Lung Cancer    Order Specific Question:   If indicated for the ordered procedure, I authorize the administration of contrast media per Radiology protocol    Answer:   Yes    Order Specific Question:   Preferred imaging location?    Answer:   [facility]    Order Specific Question:   Radiology Contrast Protocol - do NOT remove file path    Answer:   \\[email or web address]  . CT Abdomen Pelvis W Contrast    Standing Status:   Future    Standing Expiration Date:   11/22/2019    Order Specific Question:   ** REASON FOR EXAM (FREE TEXT)    Answer:   Restaging Lung Cancer    Order Specific Question:   If indicated for the ordered procedure, I authorize the administration of contrast media per Radiology protocol    Answer:    Yes    Order Specific Question:   Preferred imaging location?    Answer:   [facility]    Order Specific Question:   Is Oral Contrast requested for this exam?    Answer:   Yes, Per Radiology protocol    Order Specific Question:   Radiology Contrast Protocol - do NOT remove file path    Answer:   \\[email or web address]      L , PA-C 11/22/18  ADDENDUM: Hematology/Oncology Attending: I had a face-to-face encounter with the patient today.  I recommended her care plan.  This is a very pleasant 73 years old Hispanic female with metastatic non-small cell lung cancer status post induction systemic chemotherapy with carboplatin and Alimta and she is currently on maintenance treatment with single agent Alimta status post 46 cycles.  The patient has been tolerating this treatment well with no concerning adverse effects except for anemia and fatigue. I recommended for the patient to proceed with cycle #47 today as planned. She will come back for follow-up visit in 3 weeks for evaluation after repeating CT scan of the chest, abdomen and pelvis for restaging of her disease. The patient was advised to call immediately if she has any concerning symptoms in the interval.  Disclaimer: This note was dictated with voice recognition software. Similar sounding words can inadvertently be transcribed and may be missed upon review. Eilleen Kempf, MD 11/22/18

## 2018-11-23 ENCOUNTER — Telehealth: Payer: Self-pay | Admitting: Physician Assistant

## 2018-11-23 NOTE — Telephone Encounter (Signed)
Scheduled appt per 6/17 los.

## 2018-11-30 ENCOUNTER — Encounter: Payer: Self-pay | Admitting: Neurology

## 2018-11-30 ENCOUNTER — Other Ambulatory Visit: Payer: Self-pay

## 2018-11-30 ENCOUNTER — Ambulatory Visit (INDEPENDENT_AMBULATORY_CARE_PROVIDER_SITE_OTHER): Payer: Medicare Other | Admitting: Neurology

## 2018-11-30 VITALS — BP 122/74 | HR 88 | Ht 63.0 in | Wt 147.0 lb

## 2018-11-30 DIAGNOSIS — M5416 Radiculopathy, lumbar region: Secondary | ICD-10-CM

## 2018-11-30 DIAGNOSIS — C3490 Malignant neoplasm of unspecified part of unspecified bronchus or lung: Secondary | ICD-10-CM | POA: Diagnosis not present

## 2018-11-30 MED ORDER — GABAPENTIN 300 MG PO CAPS: ORAL_CAPSULE | ORAL | 3 refills | Status: DC

## 2018-11-30 NOTE — Progress Notes (Signed)
[facility] Clinic Note - Initial Visit   Date: 11/30/18  Valerie Long MRN: [id number] DOB: [birth year]   Dear Dr. Larose Kells:  Thank you for your kind referral of Valerie Long for consultation of left leg pain. Although her history is well known to you, please allow [place] to reiterate it for the purpose of our medical record. The patient was accompanied to the clinic by self.   History of Present Illness: Valerie Long is a 73 y.o. 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   Starting in late in 2019, she began having low back pain with burning radiating pain into the left lateral leg and lower leg. She also has tingling over the same region.  She had some weakness with raising the leg and walking.  Today, she began using a cane for support.  Over the past few weeks, her left leg pain has become more constant and intense.  She had tried topical ointment which provides mild relief.  She had tried baclofen with no relief.    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  She had chemotherapy (included carboplatin) an radiation in 2017.  She does not recall having complications of neuropathy.  She is currently on Alimta and Zometa.   Out-side paper records, electronic medical record, and images have been reviewed where available and summarized as:  MRI perlvis 12/07/2017:   1. Stable blastic metastases of the pelvis. No new metastases, destructive lesions, or fractures. 2. Fairly severe degenerative disc disease at L3-4 and L4-5 areas of potential neural impingement at L3-4 on the right and L4-5 on the left. 3. Calcific tendinitis of the distal left gluteus medius tendon at the left greater trochanter.   Lab Results  Component Value Date   TSH 1.58 09/24/2016   No results found for: ESRSEDRATE, POCTSEDRATE   Past Medical History:  Diagnosis Date  . Adenocarcinoma of right lung, stage 4 ([place]) 2017  . Anemia   . Arthritis   . Bone metastases ([place]) 02/24/2017  . Encounter for antineoplastic chemotherapy 03/17/2016  . Hypercholesterolemia   . Hypertension 06/18/2016  . Osteopenia   . 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 Pneumonia   . Shortness of breath dyspnea     Past Surgical History:  Procedure Laterality Date  . CESAREAN SECTION     myomectomy  . COLONOSCOPY W/ POLYPECTOMY    . IR GENERIC HISTORICAL  08/17/2016   IR US GUIDE VASC ACCESS RIGHT 08/17/2016 [name], MD [facility]  . IR GENERIC HISTORICAL  08/17/2016   IR FLUORO GUIDE PORT INSERTION RIGHT 08/17/2016 [name], MD [facility]  . MEDIASTINOSCOPY N/A 09/25/2015   Procedure: MEDIASTINOSCOPY;  Surgeon: Melrose Nakayama, MD;  Location: [place];  Service: Thoracic;  Laterality: N/A;  . VIDEO BRONCHOSCOPY Bilateral 08/19/2015   Procedure: VIDEO BRONCHOSCOPY WITH FLUORO;  Surgeon: Rigoberto Noel, MD;  Location: [place];  Service: Cardiopulmonary;  Laterality: Bilateral;  . VIDEO BRONCHOSCOPY N/A 09/08/2015   Procedure: VIDEO BRONCHOSCOPY WITH FLUORO;  Surgeon: Rigoberto Noel, MD;  Location: [facility];  Service: Thoracic;  Laterality: N/A;  . VIDEO BRONCHOSCOPY WITH ENDOBRONCHIAL ULTRASOUND Right 09/08/2015   Procedure: ATTEMPTED VIDEO BRONCHOSCOPY ENDOBRONCHIAL ULTRASOUND  ;  Surgeon: Rigoberto Noel, MD;  Location: [facility];  Service: Thoracic;  Laterality:  Right;     Medications:  Outpatient Encounter Medications as of 11/30/2018  Medication Sig Note  . acetaminophen (TYLENOL) 500 MG tablet Take 1,000 mg by mouth every 6 (six) hours as needed for moderate pain or headache. 08/17/2017: PRN  . baclofen (LIORESAL) 10 MG tablet Take 1 tablet (10 mg total) by mouth 3 (three) times daily.   . Calcium Carb-Cholecalciferol (CALCIUM 600/VITAMIN D3 PO) Take by mouth.   . Cyanocobalamin (B-12 PO) Take 2 tablets  by mouth daily.   Marland Kitchen dexamethasone (DECADRON) 4 MG tablet Take 4mg  by mouth twice daily the day before, of, and after chemo   . Fish Oil-Cholecalciferol (OMEGA-3 + VITAMIN D3 PO) Take 1 tablet by mouth daily.   . folic acid (FOLVITE) 1 MG tablet Take 1 tablet (1 mg total) by mouth daily.   Marland Kitchen lidocaine-prilocaine (EMLA) cream Apply 1 application topically as needed. Apply 1-2 tsp over port site 1-2 hours prior to chemo.   . meloxicam (MOBIC) 15 MG tablet TAKE 1 TABLET BY MOUTH EVERY DAY AS NEEDED FOR PAIN   . ondansetron (ZOFRAN) 8 MG tablet Take 8 mg by mouth every 8 (eight) hours as needed. 08/17/2017: PRN  . OVER THE COUNTER MEDICATION Apply 1 drop topically daily as needed. 1 drop CBD oil in vicks vapor rub . Applied to right thigh for pain relief.   Marland Kitchen oxyCODONE-acetaminophen (PERCOCET/ROXICET) 5-325 MG tablet Take 1 tablet by mouth every 8 (eight) hours as needed for severe pain.   Marland Kitchen PRESCRIPTION MEDICATION Inject 1 Dose as directed every 21 ( twenty-one) days. Chemo 12/23/2016: Alimta  . prochlorperazine (COMPAZINE) 10 MG tablet Take 1 tablet (10 mg total) by mouth every 6 (six) hours as needed for nausea or vomiting.   . [DISCONTINUED] benzonatate (TESSALON) 100 MG capsule Take 1 capsule (100 mg total) by mouth 3 (three) times daily as needed for cough.    No facility-administered encounter medications on file as of 11/30/2018.     Allergies: No Known Allergies  Family History: Family History  Problem Relation Age of Onset  . Hypertension Mother        F and M  . CAD Father   . Diabetes Other        auncle-aunts   . Cancer Other        UTERINE   . Breast cancer Sister   . Stroke Neg Hx   . Colon cancer Neg Hx     Social History: Social History   Tobacco Use  . Smoking status: Never Smoker  . Smokeless tobacco: Never Used  Substance Use Topics  . Alcohol use: Yes    Alcohol/week: 0.0 standard drinks    Comment: SOCIAL  . Drug use: No   Social History   Social History  Narrative   From [place], moved from [place]  to [place] 2008   Lives w/ her mother and her son       Right handed      Highest level of edu- 7th grade      Retired    Review of Systems:  CONSTITUTIONAL: No fevers, chills, night sweats, or weight loss.   EYES: No visual changes or eye pain ENT: No hearing changes.  No history of nose bleeds.   RESPIRATORY: No cough, wheezing and shortness of breath.   CARDIOVASCULAR: Negative for chest pain, and palpitations.   GI: Negative for abdominal discomfort, blood in stools or black stools.  No recent change in bowel habits.   GU:  No history of incontinence.   MUSCLOSKELETAL: +history of joint pain or swelling.  No myalgias.   SKIN: Negative for lesions, rash, and itching.   HEMATOLOGY/ONCOLOGY: Negative for prolonged bleeding, bruising easily, and swollen nodes.  +history of cancer.   ENDOCRINE: Negative for cold or heat intolerance, polydipsia or goiter.   PSYCH:  No depression or anxiety symptoms.   NEURO: As Above.   Vital Signs:  BP 122/74   Pulse 88   Ht 5\' 3"  (1.6 m)   Wt 147 lb (66.7 kg)   LMP 06/09/1998   SpO2 100%   BMI 26.04 kg/m    General Medical Exam:   General:  Well appearing, comfortable.   Eyes/ENT: see cranial nerve examination.   Neck:   No carotid bruits. Respiratory:  Clear to auscultation, good air entry bilaterally.   Cardiac:  Regular rate and rhythm, no murmur.   Extremities:  No deformities, edema, or skin discoloration.  Skin:  No rashes or lesions.  Neurological Exam: MENTAL STATUS including orientation to time, place, person, recent and remote memory, attention span and concentration, language, and fund of knowledge is normal.  Speech is not dysarthric.  CRANIAL NERVES: II:  No visual field defects.  Unremarkable fundi.   III-IV-VI: Pupils equal round and reactive to light.  Normal conjugate, extra-ocular eye movements in all directions of gaze.  No nystagmus.  No ptosis.   V:  Normal  facial sensation.    VII:  Normal facial symmetry and movements.   VIII:  Normal hearing and vestibular function.   IX-X:  Normal palatal movement.   XI:  Normal shoulder shrug and head rotation.   XII:  Normal tongue strength and range of motion, no deviation or fasciculation.  MOTOR:  No atrophy, fasciculations or abnormal movements.  No pronator drift.   Upper Extremity:  Right  Left  Deltoid  5/5   5/5   Biceps  5/5   5/5   Triceps  5/5   5/5   Infraspinatus 5/5  5/5  Medial pectoralis 5/5  5/5  Wrist extensors  5/5   5/5   Wrist flexors  5/5   5/5   Finger extensors  5/5   5/5   Finger flexors  5/5   5/5   Dorsal interossei  5/5   5/5   Abductor pollicis  5/5   5/5   Tone (Ashworth scale)  0  0   Lower Extremity:  Right  Left  Hip flexors  5/5   4/5   Hip extensors  5/5   5/5   Adductor 5/5  5/5  Abductor 5/5  5/5  Knee flexors  5/5   5/5   Knee extensors  5/5   5/5   Dorsiflexors  5/5   4/5   Plantarflexors  5/5   5/5   Toe extensors  5/5   4/5   Toe flexors  5/5   5/5   Tone (Ashworth scale)  0  0   MSRs:  Right        Left                  brachioradialis 2+  2+  biceps 2+  2+  triceps 2+  2+  patellar 0  0  ankle jerk 0  0  Hoffman no  no  plantar response down  down   SENSORY:  Normal and symmetric perception of light touch, pinprick, vibration, and proprioception.    COORDINATION/GAIT: Normal finger-to- nose-finger  and heel-to-shin.  Intact rapid alternating movements bilaterally.  Able to rise from a chair without using arms.  Gait appears very slow and favoring the right leg, unstable.      IMPRESSION: 1. Probable lumbosacral radiculopathy affecting the L3-5 myotome manifesting with radicular pain and weakness in the left leg 2.  Non-small cell cancer with bone metastasis to the spine  PLAN/RECOMMENDATIONS:  1.  MRI lumbar spine with and without contrast to evaluate for structural lesion, specifically metastasis 2.  She may need NCS/EMG of the  left leg going forward, if MRI does not explain symptoms  3.  For pain, start gabapentin 300 mg at bedtime for 1 week, then increase to 300 mg twice daily if tolerating 4.  Outpatient physical therapy was offered, however she she does not wish to start this due to concerns with coronavirus. 5.  All precautions were discussed and she has started using a cane as of today.  Further recommendations pending results.   Thank you for allowing me to participate in patient's care.  If I can answer any additional questions, I would be pleased to do so.    Sincerely,    Rashelle Ireland K. Posey Pronto, DO

## 2018-11-30 NOTE — Patient Instructions (Addendum)
Start gabapentin 300mg  at bedtime for one week, then increase to 300mg  twice daily  MRI lumbar spine wwo contrast.   Call my office if you would like to start to start physical therapy  I will call you with the results and decide the next step

## 2018-12-02 DIAGNOSIS — Z20828 Contact with and (suspected) exposure to other viral communicable diseases: Secondary | ICD-10-CM | POA: Diagnosis not present

## 2018-12-11 ENCOUNTER — Ambulatory Visit (HOSPITAL_COMMUNITY)
Admission: RE | Admit: 2018-12-11 | Discharge: 2018-12-11 | Disposition: A | Discharge status: Home / Self Care | Payer: Medicare Other | Source: Ambulatory Visit | Attending: Physician Assistant | Admitting: Physician Assistant

## 2018-12-11 ENCOUNTER — Encounter (HOSPITAL_COMMUNITY): Payer: Self-pay

## 2018-12-11 ENCOUNTER — Other Ambulatory Visit: Payer: Self-pay

## 2018-12-11 DIAGNOSIS — C7801 Secondary malignant neoplasm of right lung: Secondary | ICD-10-CM | POA: Diagnosis not present

## 2018-12-11 DIAGNOSIS — K8020 Calculus of gallbladder without cholecystitis without obstruction: Secondary | ICD-10-CM | POA: Diagnosis not present

## 2018-12-11 DIAGNOSIS — C7951 Secondary malignant neoplasm of bone: Secondary | ICD-10-CM | POA: Diagnosis not present

## 2018-12-11 DIAGNOSIS — C3491 Malignant neoplasm of unspecified part of right bronchus or lung: Secondary | ICD-10-CM | POA: Diagnosis not present

## 2018-12-11 DIAGNOSIS — C801 Malignant (primary) neoplasm, unspecified: Secondary | ICD-10-CM | POA: Diagnosis not present

## 2018-12-11 DIAGNOSIS — C7802 Secondary malignant neoplasm of left lung: Secondary | ICD-10-CM | POA: Diagnosis not present

## 2018-12-11 MED ORDER — IOHEXOL 300 MG/ML  SOLN
100.0000 mL | Freq: Once | INTRAMUSCULAR | Status: AC | PRN
  Administered 2018-12-11: 100 mL via INTRAVENOUS

## 2018-12-11 MED ORDER — SODIUM CHLORIDE (PF) 0.9 % IJ SOLN
INTRAMUSCULAR | Status: AC
  Filled 2018-12-11: qty 50

## 2018-12-12 ENCOUNTER — Other Ambulatory Visit: Payer: Self-pay | Admitting: Lab

## 2018-12-13 ENCOUNTER — Inpatient Hospital Stay (HOSPITAL_BASED_OUTPATIENT_CLINIC_OR_DEPARTMENT_OTHER): Payer: Medicare Other | Admitting: Internal Medicine

## 2018-12-13 ENCOUNTER — Inpatient Hospital Stay: Payer: Medicare Other | Attending: Internal Medicine

## 2018-12-13 ENCOUNTER — Encounter: Payer: Self-pay | Admitting: Internal Medicine

## 2018-12-13 ENCOUNTER — Inpatient Hospital Stay: Payer: Medicare Other

## 2018-12-13 ENCOUNTER — Other Ambulatory Visit: Payer: Self-pay

## 2018-12-13 ENCOUNTER — Telehealth: Payer: Self-pay | Admitting: Internal Medicine

## 2018-12-13 VITALS — BP 131/86 | HR 97 | Temp 98.7°F | Resp 18 | Ht 63.0 in | Wt 149.1 lb

## 2018-12-13 DIAGNOSIS — Z5111 Encounter for antineoplastic chemotherapy: Secondary | ICD-10-CM | POA: Diagnosis not present

## 2018-12-13 DIAGNOSIS — M545 Low back pain: Secondary | ICD-10-CM | POA: Diagnosis not present

## 2018-12-13 DIAGNOSIS — R5383 Other fatigue: Secondary | ICD-10-CM | POA: Diagnosis not present

## 2018-12-13 DIAGNOSIS — C3491 Malignant neoplasm of unspecified part of right bronchus or lung: Secondary | ICD-10-CM

## 2018-12-13 DIAGNOSIS — C3431 Malignant neoplasm of lower lobe, right bronchus or lung: Secondary | ICD-10-CM | POA: Diagnosis not present

## 2018-12-13 DIAGNOSIS — C7951 Secondary malignant neoplasm of bone: Secondary | ICD-10-CM

## 2018-12-13 DIAGNOSIS — Z95828 Presence of other vascular implants and grafts: Secondary | ICD-10-CM

## 2018-12-13 LAB — CBC WITH DIFFERENTIAL (CANCER CENTER ONLY)
Abs Immature Granulocytes: 0.02 10*3/uL (ref 0.00–0.07)
Basophils Absolute: 0 10*3/uL (ref 0.0–0.1)
Basophils Relative: 0 %
Eosinophils Absolute: 0 10*3/uL (ref 0.0–0.5)
Eosinophils Relative: 0 %
HCT: 32.6 % — ABNORMAL LOW (ref 36.0–46.0)
Hemoglobin: 10.5 g/dL — ABNORMAL LOW (ref 12.0–15.0)
Immature Granulocytes: 0 %
Lymphocytes Relative: 10 %
Lymphs Abs: 0.5 10*3/uL — ABNORMAL LOW (ref 0.7–4.0)
MCH: 33.7 pg (ref 26.0–34.0)
MCHC: 32.2 g/dL (ref 30.0–36.0)
MCV: 104.5 fL — ABNORMAL HIGH (ref 80.0–100.0)
Monocytes Absolute: 0.3 10*3/uL (ref 0.1–1.0)
Monocytes Relative: 6 %
Neutro Abs: 3.9 10*3/uL (ref 1.7–7.7)
Neutrophils Relative %: 84 %
Platelet Count: 251 10*3/uL (ref 150–400)
RBC: 3.12 MIL/uL — ABNORMAL LOW (ref 3.87–5.11)
RDW: 14.3 % (ref 11.5–15.5)
WBC Count: 4.7 10*3/uL (ref 4.0–10.5)
nRBC: 0 % (ref 0.0–0.2)

## 2018-12-13 LAB — CMP (CANCER CENTER ONLY)
ALT: 14 U/L (ref 0–44)
AST: 29 U/L (ref 15–41)
Albumin: 3.4 g/dL — ABNORMAL LOW (ref 3.5–5.0)
Alkaline Phosphatase: 66 U/L (ref 38–126)
Anion gap: 10 (ref 5–15)
BUN: 22 mg/dL (ref 8–23)
CO2: 26 mmol/L (ref 22–32)
Calcium: 9.9 mg/dL (ref 8.9–10.3)
Chloride: 102 mmol/L (ref 98–111)
Creatinine: 1.24 mg/dL — ABNORMAL HIGH (ref 0.44–1.00)
GFR, Est AFR Am: 51 mL/min — ABNORMAL LOW (ref >60)
GFR, Estimated: 44 mL/min — ABNORMAL LOW (ref >60)
Glucose, Bld: 148 mg/dL — ABNORMAL HIGH (ref 70–99)
Potassium: 4 mmol/L (ref 3.5–5.1)
Sodium: 138 mmol/L (ref 135–145)
Total Bilirubin: 0.5 mg/dL (ref 0.3–1.2)
Total Protein: 7.1 g/dL (ref 6.5–8.1)

## 2018-12-13 MED ORDER — SODIUM CHLORIDE 0.9 % IV SOLN
480.0000 mg/m2 | Freq: Once | INTRAVENOUS | Status: AC
  Administered 2018-12-13: 900 mg via INTRAVENOUS
  Filled 2018-12-13: qty 20

## 2018-12-13 MED ORDER — SODIUM CHLORIDE 0.9% FLUSH
10.0000 mL | Freq: Once | INTRAVENOUS | Status: AC
  Administered 2018-12-13: 10 mL
  Filled 2018-12-13: qty 10

## 2018-12-13 MED ORDER — HEPARIN SOD (PORK) LOCK FLUSH 100 UNIT/ML IV SOLN
500.0000 [IU] | Freq: Once | INTRAVENOUS | Status: AC | PRN
  Administered 2018-12-13: 500 [IU]
  Filled 2018-12-13: qty 5

## 2018-12-13 MED ORDER — SODIUM CHLORIDE 0.9 % IV SOLN
Freq: Once | INTRAVENOUS | Status: AC
  Administered 2018-12-13: 12:00:00 via INTRAVENOUS
  Filled 2018-12-13: qty 250

## 2018-12-13 MED ORDER — PROCHLORPERAZINE MALEATE 10 MG PO TABS
10.0000 mg | ORAL_TABLET | Freq: Once | ORAL | Status: AC
  Administered 2018-12-13: 10 mg via ORAL

## 2018-12-13 MED ORDER — PROCHLORPERAZINE MALEATE 10 MG PO TABS
ORAL_TABLET | ORAL | Status: AC
  Filled 2018-12-13: qty 1

## 2018-12-13 MED ORDER — SODIUM CHLORIDE 0.9% FLUSH
10.0000 mL | INTRAVENOUS | Status: DC | PRN
  Administered 2018-12-13: 10 mL
  Filled 2018-12-13: qty 10

## 2018-12-13 NOTE — Telephone Encounter (Signed)
Added additional cycles per 7/08 los - pt to get an updated schedule next visit.

## 2018-12-13 NOTE — Progress Notes (Signed)
[place] [phone]   Fax:(336) [place], MD [place] [address] [place] [address]  DIAGNOSIS: Stage IV (T1b, N2, M1b) non-small cell lung cancer, adenocarcinoma diagnosed in March 2017 and presented with right lower lobe lung nodule in addition to mediastinal lymphadenopathy and metastatic bone lesions.  Molecular studies: PDL 1 TPS  <1%.   [facility] One Studies: Positive for ERBB2 [id number]. Negative for EGFR, KRAS, ALK, BRAF, MET, RET and ROS1.  PRIOR THERAPY:  1) Induction systemic chemotherapy with carboplatin for AUC of 5 and Alimta 500 MG/M2 every 3 weeks is status post 6 cycles at the [facility], last dose was given 03/05/2016 with stable disease. 2) palliative radiation to the metastatic bone disease in the lower back and pelvic area.  CURRENT THERAPY::  1)  Maintenance systemic chemotherapy with Alimta 500 MG/M2 every 3 weeks status post 47 cycles. First dose was given 03/25/2016.  2) Zometa 4 mg IV every 12 week for metastatic bone disease.  INTERVAL HIST Valerie Long 73 y.o. female returns to the clinic today for follow-up visit.  The patient is feeling fine today with no concerning complaints except for mild fatigue and the low back pain.  She was recently seen by neurology and started on gabapentin.  She denied having any current chest pain, shortness of breath, cough or hemoptysis.  She denied having any fever or chills.  She has no nausea, vomiting, diarrhea or constipation.  She denied having any headache or visual changes.  The patient continues to tolerate her treatment with Alimta fairly well.  She had repeat CT scan of the chest, abdomen pelvis performed recently and she is here for evaluation and discussion of her scan results.   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  MEDICATIONS:  Current Outpatient Medications  Medication Sig Dispense Refill   acetaminophen (TYLENOL) 500 MG tablet Take 1,000 mg by mouth every 6 (six) hours as needed for moderate pain or headache.     baclofen (LIORESAL) 10 MG tablet Take 1 tablet (10 mg total) by mouth 3 (three) times daily. 30 each 0   Calcium Carb-Cholecalciferol (CALCIUM 600/VITAMIN D3 PO) Take by mouth.     Cyanocobalamin (B-12 PO) Take 2 tablets by mouth daily.     dexamethasone (DECADRON) 4 MG tablet Take 39m by mouth twice daily the day before, of, and after chemo 40 tablet 1   Fish Oil-Cholecalciferol (OMEGA-3 + VITAMIN D3 PO) Take 1 tablet by mouth daily.     folic acid (FOLVITE) 1 MG tablet Take 1 tablet (1 mg total) by mouth daily. 90 tablet 0   gabapentin (NEURONTIN) 300 MG capsule Take 1 tablet at bedtime for one week, then increase to 1 tablet twice daily. 60 capsule 3   lidocaine-prilocaine (EMLA) cream Apply 1 application topically as needed. Apply 1-2 tsp over port site 1-2 hours prior to chemo. 30 g 0   meloxicam (MOBIC) 15 MG tablet TAKE 1  TABLET BY MOUTH EVERY DAY AS NEEDED FOR PAIN 30 tablet 0   ondansetron (ZOFRAN) 8 MG tablet Take 8 mg by mouth every 8 (eight) hours as needed.     OVER THE COUNTER MEDICATION Apply 1 drop topically daily as needed. 1 drop CBD oil in vicks vapor rub . Applied to right thigh for pain relief.     oxyCODONE-acetaminophen (PERCOCET/ROXICET) 5-325 MG tablet Take 1 tablet by mouth every 8 (eight) hours as needed for severe pain. 30 tablet 0   PRESCRIPTION MEDICATION Inject 1 Dose as directed every 21 ( twenty-one) days. Chemo     prochlorperazine (COMPAZINE)  10 MG tablet Take 1 tablet (10 mg total) by mouth every 6 (six) hours as needed for nausea or vomiting. 30 tablet 0   No current facility-administered medications for this visit.     SURGICAL HISTORY:  Past Surgical History:  Procedure Laterality Date   CESAREAN SECTION     myomectomy   COLONOSCOPY W/ POLYPECTOMY     IR GENERIC HISTORICAL  08/17/2016   IR US GUIDE VASC ACCESS RIGHT 08/17/2016 [name], MD [facility]   IR GENERIC HISTORICAL  08/17/2016   IR FLUORO GUIDE PORT INSERTION RIGHT 08/17/2016 [name], MD [facility]   MEDIASTINOSCOPY N/A 09/25/2015   Procedure: MEDIASTINOSCOPY;  Surgeon: Melrose Nakayama, MD;  Location: [facility];  Service: Thoracic;  Laterality: N/A;   VIDEO BRONCHOSCOPY Bilateral 08/19/2015   Procedure: VIDEO BRONCHOSCOPY WITH FLUORO;  Surgeon: Rigoberto Noel, MD;  Location: [facility];  Service: Cardiopulmonary;  Laterality: Bilateral;   VIDEO BRONCHOSCOPY N/A 09/08/2015   Procedure: VIDEO BRONCHOSCOPY WITH FLUORO;  Surgeon: Rigoberto Noel, MD;  Location: [facility];  Service: Thoracic;  Laterality: N/A;   VIDEO BRONCHOSCOPY WITH ENDOBRONCHIAL ULTRASOUND Right 09/08/2015   Procedure: ATTEMPTED VIDEO BRONCHOSCOPY ENDOBRONCHIAL ULTRASOUND  ;  Surgeon: Rigoberto Noel, MD;  Location: [facility];  Service: Thoracic;  Laterality: Right;    REVIEW OF SYSTEMS:  Constitutional: positive for fatigue Eyes: negative Ears, nose, mouth, throat, and face: negative Respiratory: negative Cardiovascular: negative Gastrointestinal: negative Genitourinary:negative Integument/breast: negative Hematologic/lymphatic: negative Musculoskeletal:positive for back pain Neurological: negative Behavioral/Psych: negative Endocrine: negative Allergic/Immunologic: negative   PHYSICAL EXAMINATION: General appearance: alert, cooperative, fatigued and no distress Head: Normocephalic, without obvious abnormality, atraumatic Neck: no adenopathy, no JVD, supple, symmetrical,  trachea midline and thyroid not enlarged, symmetric, no tenderness/mass/nodules Lymph nodes: Cervical, supraclavicular, and axillary nodes normal. Resp: clear to auscultation bilaterally Back: symmetric, no curvature. ROM normal. No CVA tenderness. Cardio: regular rate and rhythm, S1, S2 normal, no murmur, click, rub or gallop GI: soft, non-tender; bowel sounds normal; no masses,  no organomegaly Extremities: extremities normal, atraumatic, no cyanosis or edema Neurologic: Alert and oriented X 3, normal strength and tone. Normal symmetric reflexes. Normal coordination and gait  ECOG PERFORMANCE STATUS: 1 - Symptomatic but completely ambulatory  Blood pressure 131/86, pulse 97, temperature 98.7 F (37.1 C), temperature source Oral, resp. rate 18, height '5\' 3"'$  (1.6 m), weight 149 lb 1.6 oz (67.6 kg), last menstrual period 06/09/1998, SpO2 99 %.  LABORATORY DATA: Lab Results  Component Value Date   WBC 4.7 12/13/2018   HGB 10.5 (L) 12/13/2018   HCT 32.6 (L) 12/13/2018   MCV 104.5 (H) 12/13/2018   PLT 251 12/13/2018      Chemistry      Component Value Date/Time   NA 140 11/22/2018 1020   NA 137 06/09/2017 0901   K 4.0 11/22/2018 1020   K  3.9 06/09/2017 0901   CL 106 11/22/2018 1020   CO2 24 11/22/2018 1020   CO2 25 06/09/2017 0901   BUN 20 11/22/2018 1020   BUN 19.0 06/09/2017 0901   CREATININE 1.16 (H) 11/22/2018 1020   CREATININE 1.2 (H) 06/09/2017 0901   GLU 170 01/23/2016      Component Value Date/Time   CALCIUM 9.8 11/22/2018 1020   CALCIUM 9.9 06/09/2017 0901   ALKPHOS 64 11/22/2018 1020   ALKPHOS 63 06/09/2017 0901   AST 30 11/22/2018 1020   AST 35 (H) 06/09/2017 0901   ALT 14 11/22/2018 1020   ALT 23 06/09/2017 0901   BILITOT 0.4 11/22/2018 1020   BILITOT 0.65 06/09/2017 0901       RADIOGRAPHIC STUDIES: Ct Chest W Contrast  Result Date: 12/11/2018 CLINICAL DATA:  Metastatic lung cancer. Restaging. Status post XRT. Ongoing chemotherapy. EXAM: CT CHEST,  ABDOMEN, AND PELVIS WITH CONTRAST TECHNIQUE: Multidetector CT imaging of the chest, abdomen and pelvis was performed following the standard protocol during bolus administration of intravenous contrast. CONTRAST:  11m OMNIPAQUE IOHEXOL 300 MG/ML  SOLN COMPARISON:  08/28/2018 FINDINGS: CT CHEST FINDINGS Cardiovascular: Normal heart size. No pericardial effusion. Aortic atherosclerosis. Mediastinum/Nodes: Normal appearance of the thyroid gland. The trachea appears patent and is midline. Normal appearance of the esophagus. No enlarged supraclavicular, axillary, mediastinal, or hilar lymph nodes. Lungs/Pleura: No pleural effusion. The dominant lung lesion within the anterior right lower lobe measures 3.0 by 2.4 cm, image 100/6. Stable from previous exam. Bilateral pulmonary metastasis are again noted: Right upper lobe pulmonary nodule measures 1.4 cm, image 58/6. Previously 1.3 cm. Right middle lobe lung nodule measures 1.4 cm, image 108/6. Unchanged. Index nodule in the posterior left lower lobe measures 0.9 cm, image 124/6. Unchanged. 1 cm lingular nodule is identified, image 86/6.  Unchanged. Nodule in the superior segment of left lower lobe is stable measuring 1.3 cm. Central left upper lobe lung nodule measures 0.9 cm, image 54/6. Unchanged. Musculoskeletal: Multifocal sclerotic bone metastases are again noted. Not significantly changed compared with previous exam. CT ABDOMEN PELVIS FINDINGS Hepatobiliary: No focal liver abnormalities. Small stones noted within the dependent portion of the gallbladder measuring up to 5 mm. No suspicious liver lesion. Pancreas: Unremarkable. No pancreatic ductal dilatation or surrounding inflammatory changes. Spleen: Normal in size without focal abnormality. Adrenals/Urinary Tract: The adrenal glands appear normal. Unremarkable appearance of the right kidney. Left pelvic kidney, unchanged. Urinary bladder unremarkable. Stomach/Bowel: Stomach appears normal. The small bowel loops  have a normal course and caliber. The appendix is visualized and is normal. Large stool burden is identified within the cecum. No pathologic dilatation of the colon. Vascular/Lymphatic: Aortic atherosclerosis. No abdominal or pelvic adenopathy. Reproductive: Uterus and bilateral adnexa are unremarkable. Other: No free fluid or fluid collections. Musculoskeletal: Diffuse small sclerotic osseous lesions are identified within the lumbar spine and pelvis. These are not significantly changed when compared with previous exam. IMPRESSION: 1. No new or progressive metastatic disease. 2. Stable appearance of right lower lobe lung mass and bilateral pulmonary metastasis. 3. Stable diffuse sclerotic bone metastases. 4.  Aortic Atherosclerosis (ICD10-I70.0). 5. Gallstones. Electronically Signed   By: TKerby MoorsM.D.   On: 12/11/2018 19:53   Ct Abdomen Pelvis W Contrast  Result Date: 12/11/2018 CLINICAL DATA:  Metastatic lung cancer. Restaging. Status post XRT. Ongoing chemotherapy. EXAM: CT CHEST, ABDOMEN, AND PELVIS WITH CONTRAST TECHNIQUE: Multidetector CT imaging of the chest, abdomen and pelvis was performed following the standard protocol during bolus administration of intravenous  contrast. CONTRAST:  118m OMNIPAQUE IOHEXOL 300 MG/ML  SOLN COMPARISON:  08/28/2018 FINDINGS: CT CHEST FINDINGS Cardiovascular: Normal heart size. No pericardial effusion. Aortic atherosclerosis. Mediastinum/Nodes: Normal appearance of the thyroid gland. The trachea appears patent and is midline. Normal appearance of the esophagus. No enlarged supraclavicular, axillary, mediastinal, or hilar lymph nodes. Lungs/Pleura: No pleural effusion. The dominant lung lesion within the anterior right lower lobe measures 3.0 by 2.4 cm, image 100/6. Stable from previous exam. Bilateral pulmonary metastasis are again noted: Right upper lobe pulmonary nodule measures 1.4 cm, image 58/6. Previously 1.3 cm. Right middle lobe lung nodule measures 1.4 cm,  image 108/6. Unchanged. Index nodule in the posterior left lower lobe measures 0.9 cm, image 124/6. Unchanged. 1 cm lingular nodule is identified, image 86/6.  Unchanged. Nodule in the superior segment of left lower lobe is stable measuring 1.3 cm. Central left upper lobe lung nodule measures 0.9 cm, image 54/6. Unchanged. Musculoskeletal: Multifocal sclerotic bone metastases are again noted. Not significantly changed compared with previous exam. CT ABDOMEN PELVIS FINDINGS Hepatobiliary: No focal liver abnormalities. Small stones noted within the dependent portion of the gallbladder measuring up to 5 mm. No suspicious liver lesion. Pancreas: Unremarkable. No pancreatic ductal dilatation or surrounding inflammatory changes. Spleen: Normal in size without focal abnormality. Adrenals/Urinary Tract: The adrenal glands appear normal. Unremarkable appearance of the right kidney. Left pelvic kidney, unchanged. Urinary bladder unremarkable. Stomach/Bowel: Stomach appears normal. The small bowel loops have a normal course and caliber. The appendix is visualized and is normal. Large stool burden is identified within the cecum. No pathologic dilatation of the colon. Vascular/Lymphatic: Aortic atherosclerosis. No abdominal or pelvic adenopathy. Reproductive: Uterus and bilateral adnexa are unremarkable. Other: No free fluid or fluid collections. Musculoskeletal: Diffuse small sclerotic osseous lesions are identified within the lumbar spine and pelvis. These are not significantly changed when compared with previous exam. IMPRESSION: 1. No new or progressive metastatic disease. 2. Stable appearance of right lower lobe lung mass and bilateral pulmonary metastasis. 3. Stable diffuse sclerotic bone metastases. 4.  Aortic Atherosclerosis (ICD10-I70.0). 5. Gallstones. Electronically Signed   By: TKerby MoorsM.D.   On: 12/11/2018 19:53    ASSESSMENT AND PLAN:  This is a very pleasant 73years old Hispanic female with metastatic  non-small cell lung cancer, adenocarcinoma status post induction systemic chemotherapy with carboplatin and Alimta and she is currently on maintenance treatment with single agent Alimta status post 47 cycles. The patient has been tolerating this treatment well with no concerning adverse effect except for mild fatigue. She had repeat CT scan of the chest, abdomen pelvis performed recently.  I personally and independently reviewed the scan and discussed the results with the patient today. Her CT scan showed no concerning findings for disease progression. I recommended for the patient to continue her current treatment with maintenance Alimta and she will proceed with cycle #48 today. For the low back pain, she will continue her current treatment with gabapentin as well as Percocet on as-needed basis. The patient will come back for follow-up visit in 3 weeks for evaluation before the next cycle of her treatment. She was advised to call immediately if she has any concerning symptoms in the interval. The patient voices understanding of current disease status and treatment options and is in agreement with the current care plan. All questions were answered. The patient knows to call the clinic with any problems, questions or concerns. We can certainly see the patient much sooner if necessary.  Disclaimer: This note  was dictated with voice recognition software. Similar sounding words can inadvertently be transcribed and may not be corrected upon review.

## 2018-12-13 NOTE — Patient Instructions (Signed)
[facility] Discharge Instructions for Patients Receiving Chemotherapy  Today you received the following chemotherapy agents:  Alimta.  To help prevent nausea and vomiting after your treatment, we encourage you to take your nausea medication as directed.   If you develop nausea and vomiting that is not controlled by your nausea medication, call the clinic.   BELOW ARE SYMPTOMS THAT SHOULD BE REPORTED IMMEDIATELY:  *FEVER GREATER THAN 100.5 F  *CHILLS WITH OR WITHOUT FEVER  NAUSEA AND VOMITING THAT IS NOT CONTROLLED WITH YOUR NAUSEA MEDICATION  *UNUSUAL SHORTNESS OF BREATH  *UNUSUAL BRUISING OR BLEEDING  TENDERNESS IN MOUTH AND THROAT WITH OR WITHOUT PRESENCE OF ULCERS  *URINARY PROBLEMS  *BOWEL PROBLEMS  UNUSUAL RASH Items with * indicate a potential emergency and should be followed up as soon as possible.  Feel free to call the clinic should you have any questions or concerns. The clinic phone number is [phone].  Please show the [facility] at check-in to the Emergency Department and triage nurse.

## 2018-12-23 ENCOUNTER — Other Ambulatory Visit: Payer: Self-pay | Admitting: Neurology

## 2018-12-26 ENCOUNTER — Telehealth: Payer: Self-pay | Admitting: Neurology

## 2018-12-26 NOTE — Telephone Encounter (Signed)
New Message  Patient verbalized wanting to speak to Dr. Posey Pronto, advised patient I could send message to nurse and for someone to give her a call.  Call dropped or patient went silent on telephone, after no answer I hung up

## 2018-12-27 ENCOUNTER — Other Ambulatory Visit: Payer: Self-pay | Admitting: Internal Medicine

## 2018-12-27 DIAGNOSIS — Z5111 Encounter for antineoplastic chemotherapy: Secondary | ICD-10-CM

## 2018-12-27 DIAGNOSIS — C3491 Malignant neoplasm of unspecified part of right bronchus or lung: Secondary | ICD-10-CM

## 2018-12-27 DIAGNOSIS — C3490 Malignant neoplasm of unspecified part of unspecified bronchus or lung: Secondary | ICD-10-CM

## 2018-12-27 NOTE — Telephone Encounter (Signed)
Contacted patient, she will stop the gabapentin for now and follow up with her PCP

## 2018-12-27 NOTE — Telephone Encounter (Signed)
I contacted patient, Patient complaint of swelling in bilteral legs, no shortness of breathe, but increased heart rate up to 116 over the last few days worse at night. Pt is taken gabapentin 300mg  daily, please advise

## 2018-12-27 NOTE — Telephone Encounter (Signed)
She is on a very low dose of gabapentin and swelling can be a side effect at much higher dosages.  She can certainly stop gabapentin and see if that helps.   Medication would not cause any changes in her heart rate, so she may want to see her PCP for this and also if the swelling does not improve.

## 2019-01-01 ENCOUNTER — Other Ambulatory Visit: Payer: Self-pay

## 2019-01-01 ENCOUNTER — Ambulatory Visit
Admission: RE | Admit: 2019-01-01 | Discharge: 2019-01-01 | Disposition: A | Discharge status: Home / Self Care | Payer: Medicare Other | Source: Ambulatory Visit | Attending: Neurology | Admitting: Neurology

## 2019-01-01 DIAGNOSIS — M4726 Other spondylosis with radiculopathy, lumbar region: Secondary | ICD-10-CM | POA: Diagnosis not present

## 2019-01-01 DIAGNOSIS — M8938 Hypertrophy of bone, other site: Secondary | ICD-10-CM | POA: Diagnosis not present

## 2019-01-01 DIAGNOSIS — M5416 Radiculopathy, lumbar region: Secondary | ICD-10-CM

## 2019-01-01 DIAGNOSIS — M48061 Spinal stenosis, lumbar region without neurogenic claudication: Secondary | ICD-10-CM | POA: Diagnosis not present

## 2019-01-01 DIAGNOSIS — C3490 Malignant neoplasm of unspecified part of unspecified bronchus or lung: Secondary | ICD-10-CM

## 2019-01-01 DIAGNOSIS — M5116 Intervertebral disc disorders with radiculopathy, lumbar region: Secondary | ICD-10-CM | POA: Diagnosis not present

## 2019-01-01 MED ORDER — GADOBENATE DIMEGLUMINE 529 MG/ML IV SOLN
13.0000 mL | Freq: Once | INTRAVENOUS | Status: AC | PRN
  Administered 2019-01-01: 13 mL via INTRAVENOUS

## 2019-01-02 ENCOUNTER — Telehealth: Payer: Self-pay | Admitting: Neurology

## 2019-01-02 DIAGNOSIS — M5416 Radiculopathy, lumbar region: Secondary | ICD-10-CM

## 2019-01-02 MED ORDER — GABAPENTIN 100 MG PO CAPS: ORAL_CAPSULE | ORAL | 5 refills | Status: DC

## 2019-01-02 NOTE — Telephone Encounter (Signed)
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  Post-radiation changes of the spine are seen, no new metastatic lesions.   She was getting relief with gabapentin 300mg  BID, but stopped the medication due to feeling sleepy/daytime grogginess.    PLAN:  I will start her on low dose gabapentin 100mg  at bedtime x 1 week, then increase to 100mg  BID. Start physical therapy for low back strengthening and stretching.  Return to clinic in 3 months

## 2019-01-03 ENCOUNTER — Other Ambulatory Visit: Payer: Self-pay

## 2019-01-03 ENCOUNTER — Encounter: Payer: Self-pay | Admitting: Internal Medicine

## 2019-01-03 ENCOUNTER — Inpatient Hospital Stay: Payer: Medicare Other

## 2019-01-03 ENCOUNTER — Inpatient Hospital Stay (HOSPITAL_BASED_OUTPATIENT_CLINIC_OR_DEPARTMENT_OTHER): Payer: Medicare Other | Admitting: Internal Medicine

## 2019-01-03 VITALS — BP 139/83 | HR 73 | Temp 98.9°F | Resp 18 | Ht 63.0 in | Wt 145.9 lb

## 2019-01-03 DIAGNOSIS — I10 Essential (primary) hypertension: Secondary | ICD-10-CM

## 2019-01-03 DIAGNOSIS — M545 Low back pain: Secondary | ICD-10-CM | POA: Diagnosis not present

## 2019-01-03 DIAGNOSIS — Z5111 Encounter for antineoplastic chemotherapy: Secondary | ICD-10-CM

## 2019-01-03 DIAGNOSIS — C3491 Malignant neoplasm of unspecified part of right bronchus or lung: Secondary | ICD-10-CM

## 2019-01-03 DIAGNOSIS — C3431 Malignant neoplasm of lower lobe, right bronchus or lung: Secondary | ICD-10-CM

## 2019-01-03 DIAGNOSIS — C7951 Secondary malignant neoplasm of bone: Secondary | ICD-10-CM

## 2019-01-03 DIAGNOSIS — Z95828 Presence of other vascular implants and grafts: Secondary | ICD-10-CM

## 2019-01-03 LAB — CBC WITH DIFFERENTIAL (CANCER CENTER ONLY)
Abs Immature Granulocytes: 0.02 10*3/uL (ref 0.00–0.07)
Basophils Absolute: 0 10*3/uL (ref 0.0–0.1)
Basophils Relative: 0 %
Eosinophils Absolute: 0 10*3/uL (ref 0.0–0.5)
Eosinophils Relative: 0 %
HCT: 30.4 % — ABNORMAL LOW (ref 36.0–46.0)
Hemoglobin: 10.2 g/dL — ABNORMAL LOW (ref 12.0–15.0)
Immature Granulocytes: 0 %
Lymphocytes Relative: 14 %
Lymphs Abs: 0.8 10*3/uL (ref 0.7–4.0)
MCH: 34.2 pg — ABNORMAL HIGH (ref 26.0–34.0)
MCHC: 33.6 g/dL (ref 30.0–36.0)
MCV: 102 fL — ABNORMAL HIGH (ref 80.0–100.0)
Monocytes Absolute: 0.7 10*3/uL (ref 0.1–1.0)
Monocytes Relative: 12 %
Neutro Abs: 4.3 10*3/uL (ref 1.7–7.7)
Neutrophils Relative %: 74 %
Platelet Count: 229 10*3/uL (ref 150–400)
RBC: 2.98 MIL/uL — ABNORMAL LOW (ref 3.87–5.11)
RDW: 14.5 % (ref 11.5–15.5)
WBC Count: 5.8 10*3/uL (ref 4.0–10.5)
nRBC: 0 % (ref 0.0–0.2)

## 2019-01-03 LAB — CMP (CANCER CENTER ONLY)
ALT: 13 U/L (ref 0–44)
AST: 28 U/L (ref 15–41)
Albumin: 3.4 g/dL — ABNORMAL LOW (ref 3.5–5.0)
Alkaline Phosphatase: 64 U/L (ref 38–126)
Anion gap: 7 (ref 5–15)
BUN: 19 mg/dL (ref 8–23)
CO2: 26 mmol/L (ref 22–32)
Calcium: 9.3 mg/dL (ref 8.9–10.3)
Chloride: 106 mmol/L (ref 98–111)
Creatinine: 1.12 mg/dL — ABNORMAL HIGH (ref 0.44–1.00)
GFR, Est AFR Am: 57 mL/min — ABNORMAL LOW (ref >60)
GFR, Estimated: 49 mL/min — ABNORMAL LOW (ref >60)
Glucose, Bld: 100 mg/dL — ABNORMAL HIGH (ref 70–99)
Potassium: 3.8 mmol/L (ref 3.5–5.1)
Sodium: 139 mmol/L (ref 135–145)
Total Bilirubin: 0.5 mg/dL (ref 0.3–1.2)
Total Protein: 6.9 g/dL (ref 6.5–8.1)

## 2019-01-03 MED ORDER — ZOLEDRONIC ACID 4 MG/100ML IV SOLN: 4.0000 mg | Freq: Once | INTRAVENOUS | Status: DC

## 2019-01-03 MED ORDER — SODIUM CHLORIDE 0.9 % IV SOLN
480.0000 mg/m2 | Freq: Once | INTRAVENOUS | Status: AC
  Administered 2019-01-03: 900 mg via INTRAVENOUS
  Filled 2019-01-03: qty 16

## 2019-01-03 MED ORDER — SODIUM CHLORIDE 0.9% FLUSH
10.0000 mL | Freq: Once | INTRAVENOUS | Status: AC
  Administered 2019-01-03: 10 mL
  Filled 2019-01-03: qty 10

## 2019-01-03 MED ORDER — PROCHLORPERAZINE MALEATE 10 MG PO TABS
10.0000 mg | ORAL_TABLET | Freq: Once | ORAL | Status: AC
  Administered 2019-01-03: 10 mg via ORAL

## 2019-01-03 MED ORDER — CYANOCOBALAMIN 1000 MCG/ML IJ SOLN
1000.0000 ug | Freq: Once | INTRAMUSCULAR | Status: AC
  Administered 2019-01-03: 1000 ug via INTRAMUSCULAR

## 2019-01-03 MED ORDER — CYANOCOBALAMIN 1000 MCG/ML IJ SOLN
INTRAMUSCULAR | Status: AC
  Filled 2019-01-03: qty 1

## 2019-01-03 MED ORDER — SODIUM CHLORIDE 0.9% FLUSH
10.0000 mL | INTRAVENOUS | Status: DC | PRN
  Administered 2019-01-03: 10 mL
  Filled 2019-01-03: qty 10

## 2019-01-03 MED ORDER — SODIUM CHLORIDE 0.9 % IV SOLN
Freq: Once | INTRAVENOUS | Status: AC
  Administered 2019-01-03: 12:00:00 via INTRAVENOUS
  Filled 2019-01-03: qty 250

## 2019-01-03 MED ORDER — HEPARIN SOD (PORK) LOCK FLUSH 100 UNIT/ML IV SOLN
500.0000 [IU] | Freq: Once | INTRAVENOUS | Status: AC | PRN
  Administered 2019-01-03: 500 [IU]
  Filled 2019-01-03: qty 5

## 2019-01-03 MED ORDER — PROCHLORPERAZINE MALEATE 10 MG PO TABS
ORAL_TABLET | ORAL | Status: AC
  Filled 2019-01-03: qty 1

## 2019-01-03 NOTE — Patient Instructions (Signed)
[facility] Discharge Instructions for Patients Receiving Chemotherapy  Today you received the following chemotherapy agents:  Alimta.  To help prevent nausea and vomiting after your treatment, we encourage you to take your nausea medication as directed.   If you develop nausea and vomiting that is not controlled by your nausea medication, call the clinic.   BELOW ARE SYMPTOMS THAT SHOULD BE REPORTED IMMEDIATELY:  *FEVER GREATER THAN 100.5 F  *CHILLS WITH OR WITHOUT FEVER  NAUSEA AND VOMITING THAT IS NOT CONTROLLED WITH YOUR NAUSEA MEDICATION  *UNUSUAL SHORTNESS OF BREATH  *UNUSUAL BRUISING OR BLEEDING  TENDERNESS IN MOUTH AND THROAT WITH OR WITHOUT PRESENCE OF ULCERS  *URINARY PROBLEMS  *BOWEL PROBLEMS  UNUSUAL RASH Items with * indicate a potential emergency and should be followed up as soon as possible.  Feel free to call the clinic should you have any questions or concerns. The clinic phone number is [phone].  Please show the [facility] at check-in to the Emergency Department and triage nurse.

## 2019-01-03 NOTE — Telephone Encounter (Signed)
Ordered physical therapy thru [facility].

## 2019-01-03 NOTE — Progress Notes (Signed)
Called pharmacy and spoke with [facility] and informed her that the zometa wasn't going to be given it was an error when I released.

## 2019-01-03 NOTE — Patient Instructions (Signed)

## 2019-01-03 NOTE — Progress Notes (Signed)
[place] [phone]   Fax:([phone]) [place], MD [address] [place] [address]  DIAGNOSIS: Stage IV (T1b, N2, M1b) non-small cell lung cancer, adenocarcinoma diagnosed in March 2017 and presented with right lower lobe lung nodule in addition to mediastinal lymphadenopathy and metastatic bone lesions.  Molecular studies: PDL 1 TPS  <1%.   [facility] One Studies: Positive for ERBB2 [id number]. Negative for EGFR, KRAS, ALK, BRAF, MET, RET and ROS1.  PRIOR THERAPY:  1) Induction systemic chemotherapy with carboplatin for AUC of 5 and Alimta 500 MG/M2 every 3 weeks is status post 6 cycles at the  [facility], last dose was given 03/05/2016 with stable disease. 2) palliative radiation to the metastatic bone disease in the lower back and pelvic area.  CURRENT THERAPY::  1)  Maintenance systemic chemotherapy with Alimta 500 MG/M2 every 3 weeks status post 48 cycles. First dose was given 03/25/2016.  2) Zometa 4 mg IV every 12 week for metastatic bone disease.  INTERVAL HIST Valerie Long 73 y.o. female returns to the clinic today for follow-up visit.  The patient is feeling fine today with no concerning complaints except for the persistent low back pain.  She had MRI of the lumbar spine performed yesterday that showed evidence for sclerotic bone lesion in addition to the degenerative disc disease and subarticular and foraminal stenosis on the left at L4-5 due to disc protrusion and facet hypertrophy.  She was referred by Dr. Posey Pronto for physical therapy.  The patient denied having any current chest pain, shortness of breath, cough or hemoptysis.  She denied having any fever or chills.  She has no nausea, vomiting, diarrhea or constipation.  She is here today for evaluation before starting cycle #49.  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  MEDICATIONS:  Current Outpatient Medications  Medication Sig Dispense Refill   acetaminophen (TYLENOL) 500 MG tablet Take 1,000 mg by mouth every 6 (six) hours as needed for moderate pain or headache.     baclofen (LIORESAL) 10 MG tablet Take 1 tablet (10 mg total) by mouth 3 (three) times daily. 30 each 0   Calcium Carb-Cholecalciferol (CALCIUM 600/VITAMIN D3 PO) Take by mouth.     Cyanocobalamin (B-12 PO) Take 2 tablets by mouth daily.     dexamethasone (DECADRON) 4 MG tablet Take 43m by mouth twice daily the day before, of, and after chemo 40 tablet 1   Fish Oil-Cholecalciferol (OMEGA-3 + VITAMIN D3 PO) Take 1 tablet by mouth daily.     folic acid (FOLVITE) 1 MG tablet TAKE 1 TABLET BY MOUTH EVERY DAY 90 tablet 0   gabapentin (NEURONTIN) 100 MG capsule Take 1 tablet at bedtime x 1 week, then increase to 1 tablet twice daily. 60 capsule 5   lidocaine-prilocaine (EMLA) cream Apply 1 application topically as needed. Apply 1-2 tsp over port site 1-2 hours prior to chemo. 30 g 0   meloxicam (MOBIC) 15 MG tablet TAKE 1  TABLET BY MOUTH EVERY DAY AS NEEDED FOR PAIN 30 tablet 0   ondansetron (ZOFRAN) 8 MG tablet Take 8 mg by mouth every 8 (eight) hours as needed.     OVER THE COUNTER MEDICATION Apply 1 drop topically daily as needed. 1 drop CBD oil in vicks vapor rub . Applied to right thigh for pain relief.     oxyCODONE-acetaminophen (PERCOCET/ROXICET) 5-325 MG tablet Take 1 tablet by mouth every 8 (eight) hours as needed for severe pain. 30 tablet 0   PRESCRIPTION MEDICATION Inject 1 Dose as directed every 21 ( twenty-one) days. Chemo      prochlorperazine (COMPAZINE) 10 MG tablet Take 1 tablet (10 mg total) by mouth every 6 (six) hours as needed for nausea or vomiting. 30 tablet 0   No current facility-administered medications for this visit.     SURGICAL HISTORY:  Past Surgical History:  Procedure Laterality Date   CESAREAN SECTION     myomectomy   COLONOSCOPY W/ POLYPECTOMY     IR GENERIC HISTORICAL  08/17/2016   IR US GUIDE VASC ACCESS RIGHT 08/17/2016 [name], MD [facility]   IR GENERIC HISTORICAL  08/17/2016   IR FLUORO GUIDE PORT INSERTION RIGHT 08/17/2016 [name], MD [facility]   MEDIASTINOSCOPY N/A 09/25/2015   Procedure: MEDIASTINOSCOPY;  Surgeon: Melrose Nakayama, MD;  Location: [facility];  Service: Thoracic;  Laterality: N/A;   VIDEO BRONCHOSCOPY Bilateral 08/19/2015   Procedure: VIDEO BRONCHOSCOPY WITH FLUORO;  Surgeon: Rigoberto Noel, MD;  Location: [facility];  Service: Cardiopulmonary;  Laterality: Bilateral;   VIDEO BRONCHOSCOPY N/A 09/08/2015   Procedure: VIDEO BRONCHOSCOPY WITH FLUORO;  Surgeon: Rigoberto Noel, MD;  Location: [facility];  Service: Thoracic;  Laterality: N/A;   VIDEO BRONCHOSCOPY WITH ENDOBRONCHIAL ULTRASOUND Right 09/08/2015   Procedure: ATTEMPTED VIDEO BRONCHOSCOPY ENDOBRONCHIAL ULTRASOUND  ;  Surgeon: Rigoberto Noel, MD;  Location: [facility];  Service: Thoracic;  Laterality: Right;    REVIEW OF SYSTEMS:  A comprehensive review of systems was negative except for: Constitutional: positive for fatigue Musculoskeletal: positive for back pain   PHYSICAL EXAMINATION: General appearance: alert, cooperative, fatigued and no distress Head: Normocephalic, without obvious abnormality, atraumatic Neck: no adenopathy, no JVD, supple, symmetrical, trachea midline and thyroid not enlarged, symmetric, no tenderness/mass/nodules Lymph nodes: Cervical, supraclavicular, and axillary nodes normal. Resp: clear to auscultation bilaterally Back: symmetric, no curvature. ROM normal. No CVA  tenderness. Cardio: regular rate and rhythm, S1, S2 normal, no murmur, click, rub or gallop GI: soft, non-tender; bowel sounds normal; no masses,  no organomegaly Extremities: extremities normal, atraumatic, no cyanosis or edema  ECOG PERFORMANCE STATUS: 1 - Symptomatic but completely ambulatory  Blood pressure 139/83, pulse 73, temperature 98.9 F (37.2 C), resp. rate 18, height '5\' 3"'  (1.6 m), weight 145 lb 14.4 oz (66.2 kg), last menstrual period 06/09/1998, SpO2 99 %.  LABORATORY DATA: Lab Results  Component Value Date   WBC 5.8 01/03/2019   HGB 10.2 (L) 01/03/2019   HCT 30.4 (L) 01/03/2019   MCV 102.0 (H) 01/03/2019   PLT 229 01/03/2019      Chemistry      Component Value Date/Time   NA 139 01/03/2019 1030   NA 137 06/09/2017 0901   K 3.8 01/03/2019 1030   K 3.9 06/09/2017 0901   CL 106 01/03/2019 1030   CO2 26 01/03/2019 1030   CO2 25 06/09/2017 0901   BUN 19 01/03/2019 1030   BUN 19.0 06/09/2017 0901   CREATININE 1.12 (H)  01/03/2019 1030   CREATININE 1.2 (H) 06/09/2017 0901   GLU 170 01/23/2016      Component Value Date/Time   CALCIUM 9.3 01/03/2019 1030   CALCIUM 9.9 06/09/2017 0901   ALKPHOS 64 01/03/2019 1030   ALKPHOS 63 06/09/2017 0901   AST 28 01/03/2019 1030   AST 35 (H) 06/09/2017 0901   ALT 13 01/03/2019 1030   ALT 23 06/09/2017 0901   BILITOT 0.5 01/03/2019 1030   BILITOT 0.65 06/09/2017 0901       RADIOGRAPHIC STUDIES: Ct Chest W Contrast  Result Date: 12/11/2018 CLINICAL DATA:  Metastatic lung cancer. Restaging. Status post XRT. Ongoing chemotherapy. EXAM: CT CHEST, ABDOMEN, AND PELVIS WITH CONTRAST TECHNIQUE: Multidetector CT imaging of the chest, abdomen and pelvis was performed following the standard protocol during bolus administration of intravenous contrast. CONTRAST:  165m OMNIPAQUE IOHEXOL 300 MG/ML  SOLN COMPARISON:  08/28/2018 FINDINGS: CT CHEST FINDINGS Cardiovascular: Normal heart size. No pericardial effusion. Aortic  atherosclerosis. Mediastinum/Nodes: Normal appearance of the thyroid gland. The trachea appears patent and is midline. Normal appearance of the esophagus. No enlarged supraclavicular, axillary, mediastinal, or hilar lymph nodes. Lungs/Pleura: No pleural effusion. The dominant lung lesion within the anterior right lower lobe measures 3.0 by 2.4 cm, image 100/6. Stable from previous exam. Bilateral pulmonary metastasis are again noted: Right upper lobe pulmonary nodule measures 1.4 cm, image 58/6. Previously 1.3 cm. Right middle lobe lung nodule measures 1.4 cm, image 108/6. Unchanged. Index nodule in the posterior left lower lobe measures 0.9 cm, image 124/6. Unchanged. 1 cm lingular nodule is identified, image 86/6.  Unchanged. Nodule in the superior segment of left lower lobe is stable measuring 1.3 cm. Central left upper lobe lung nodule measures 0.9 cm, image 54/6. Unchanged. Musculoskeletal: Multifocal sclerotic bone metastases are again noted. Not significantly changed compared with previous exam. CT ABDOMEN PELVIS FINDINGS Hepatobiliary: No focal liver abnormalities. Small stones noted within the dependent portion of the gallbladder measuring up to 5 mm. No suspicious liver lesion. Pancreas: Unremarkable. No pancreatic ductal dilatation or surrounding inflammatory changes. Spleen: Normal in size without focal abnormality. Adrenals/Urinary Tract: The adrenal glands appear normal. Unremarkable appearance of the right kidney. Left pelvic kidney, unchanged. Urinary bladder unremarkable. Stomach/Bowel: Stomach appears normal. The small bowel loops have a normal course and caliber. The appendix is visualized and is normal. Large stool burden is identified within the cecum. No pathologic dilatation of the colon. Vascular/Lymphatic: Aortic atherosclerosis. No abdominal or pelvic adenopathy. Reproductive: Uterus and bilateral adnexa are unremarkable. Other: No free fluid or fluid collections. Musculoskeletal: Diffuse  small sclerotic osseous lesions are identified within the lumbar spine and pelvis. These are not significantly changed when compared with previous exam. IMPRESSION: 1. No new or progressive metastatic disease. 2. Stable appearance of right lower lobe lung mass and bilateral pulmonary metastasis. 3. Stable diffuse sclerotic bone metastases. 4.  Aortic Atherosclerosis (ICD10-I70.0). 5. Gallstones. Electronically Signed   By: TKerby MoorsM.D.   On: 12/11/2018 19:53   Mr Lumbar Spine W Wo Contrast  Result Date: 01/02/2019 CLINICAL DATA:  Lumbar radiculopathy. Left leg pain. History of metastatic lung cancer. History of lumbar spine radiation. EXAM: MRI LUMBAR SPINE WITHOUT AND WITH CONTRAST TECHNIQUE: Multiplanar and multiecho pulse sequences of the lumbar spine were obtained without and with intravenous contrast. CONTRAST:  123mMULTIHANCE GADOBENATE DIMEGLUMINE 529 MG/ML IV SOLN COMPARISON:  No prior MRI for comparison. CT abdomen pelvis 12/11/2018 FINDINGS: Segmentation:  Normal Alignment: Moderate levoscoliosis lumbar spine. Mild retrolisthesis L1-2. Mild anterolisthesis  L2-3 Vertebrae: Fatty changes in the bone marrow L1 through L5 consistent with prior radiation change. Multiple small sclerotic lesions are present in lumbar vertebral bodies as confirmed on prior CT. This is likely due to treated metastatic disease. No change from the prior CT. Negative for fracture. Conus medullaris and cauda equina: Conus extends to the T12-L1 level. Conus and cauda equina appear normal. Paraspinal and other soft tissues: Left pelvic kidney. No retroperitoneal mass or adenopathy. Disc levels: T12-L1: Negative for stenosis L1-2: Disc degeneration with diffuse bulging of the disc and endplate spurring. Bilateral facet hypertrophy. Mild spinal stenosis. Moderate subarticular and foraminal stenosis on the right L2-3: Disc degeneration and asymmetric disc bulging and spurring on the right. Moderate spinal stenosis. Moderate  facet hypertrophy. Moderate subarticular and foraminal stenosis on the right L3-4: Disc degeneration with diffuse disc bulging and endplate spurring. Bilateral facet hypertrophy. Moderate spinal stenosis. Moderate subarticular and foraminal stenosis bilaterally L4-5: The asymmetric disc degeneration on the left. Left foraminal disc protrusion with compression of the left L4 and L5 nerve roots. Bilateral facet hypertrophy. Mild spinal stenosis. Severe subarticular and foraminal stenosis on the left. L5-S1: Left foraminal disc protrusion. Moderate subarticular and foraminal stenosis on the left. IMPRESSION: 1. Bone marrow changes of prior lumbar radiation. Multiple sclerotic small lesions throughout the lumbar vertebra compatible with treated metastatic disease. Negative for fracture. 2. Scoliosis and multilevel degenerative change as above. Multilevel spinal and foraminal stenosis. 3. With regard to the patient's left leg pain, there is severe subarticular and foraminal stenosis on the left at L4-5 due to disc protrusion and facet hypertrophy. Electronically Signed   By: Franchot Gallo M.D.   On: 01/02/2019 11:07   Ct Abdomen Pelvis W Contrast  Result Date: 12/11/2018 CLINICAL DATA:  Metastatic lung cancer. Restaging. Status post XRT. Ongoing chemotherapy. EXAM: CT CHEST, ABDOMEN, AND PELVIS WITH CONTRAST TECHNIQUE: Multidetector CT imaging of the chest, abdomen and pelvis was performed following the standard protocol during bolus administration of intravenous contrast. CONTRAST:  126m OMNIPAQUE IOHEXOL 300 MG/ML  SOLN COMPARISON:  08/28/2018 FINDINGS: CT CHEST FINDINGS Cardiovascular: Normal heart size. No pericardial effusion. Aortic atherosclerosis. Mediastinum/Nodes: Normal appearance of the thyroid gland. The trachea appears patent and is midline. Normal appearance of the esophagus. No enlarged supraclavicular, axillary, mediastinal, or hilar lymph nodes. Lungs/Pleura: No pleural effusion. The dominant lung  lesion within the anterior right lower lobe measures 3.0 by 2.4 cm, image 100/6. Stable from previous exam. Bilateral pulmonary metastasis are again noted: Right upper lobe pulmonary nodule measures 1.4 cm, image 58/6. Previously 1.3 cm. Right middle lobe lung nodule measures 1.4 cm, image 108/6. Unchanged. Index nodule in the posterior left lower lobe measures 0.9 cm, image 124/6. Unchanged. 1 cm lingular nodule is identified, image 86/6.  Unchanged. Nodule in the superior segment of left lower lobe is stable measuring 1.3 cm. Central left upper lobe lung nodule measures 0.9 cm, image 54/6. Unchanged. Musculoskeletal: Multifocal sclerotic bone metastases are again noted. Not significantly changed compared with previous exam. CT ABDOMEN PELVIS FINDINGS Hepatobiliary: No focal liver abnormalities. Small stones noted within the dependent portion of the gallbladder measuring up to 5 mm. No suspicious liver lesion. Pancreas: Unremarkable. No pancreatic ductal dilatation or surrounding inflammatory changes. Spleen: Normal in size without focal abnormality. Adrenals/Urinary Tract: The adrenal glands appear normal. Unremarkable appearance of the right kidney. Left pelvic kidney, unchanged. Urinary bladder unremarkable. Stomach/Bowel: Stomach appears normal. The small bowel loops have a normal course and caliber. The appendix is visualized and is  normal. Large stool burden is identified within the cecum. No pathologic dilatation of the colon. Vascular/Lymphatic: Aortic atherosclerosis. No abdominal or pelvic adenopathy. Reproductive: Uterus and bilateral adnexa are unremarkable. Other: No free fluid or fluid collections. Musculoskeletal: Diffuse small sclerotic osseous lesions are identified within the lumbar spine and pelvis. These are not significantly changed when compared with previous exam. IMPRESSION: 1. No new or progressive metastatic disease. 2. Stable appearance of right lower lobe lung mass and bilateral  pulmonary metastasis. 3. Stable diffuse sclerotic bone metastases. 4.  Aortic Atherosclerosis (ICD10-I70.0). 5. Gallstones. Electronically Signed   By: Kerby Moors M.D.   On: 12/11/2018 19:53    ASSESSMENT AND PLAN:  This is a very pleasant 73 years old Hispanic female with metastatic non-small cell lung cancer, adenocarcinoma status post induction systemic chemotherapy with carboplatin and Alimta and she is currently on maintenance treatment with single agent Alimta status post 48 cycles. She continues to tolerate this treatment well with no concerning adverse effects. I recommended for the patient to proceed with cycle #49 today as planned. She will come back for follow-up visit in 3 weeks for evaluation before starting cycle #50. For the low back pain, she will continue her current treatment with gabapentin as well as Percocet on as-needed basis. The patient was advised to call immediately if she has any concerning symptoms in the interval. The patient voices understanding of current disease status and treatment options and is in agreement with the current care plan. All questions were answered. The patient knows to call the clinic with any problems, questions or concerns. We can certainly see the patient much sooner if necessary.  Disclaimer: This note was dictated with voice recognition software. Similar sounding words can inadvertently be transcribed and may not be corrected upon review.

## 2019-01-19 DIAGNOSIS — M79605 Pain in left leg: Secondary | ICD-10-CM | POA: Diagnosis not present

## 2019-01-19 DIAGNOSIS — M545 Low back pain: Secondary | ICD-10-CM | POA: Diagnosis not present

## 2019-01-19 DIAGNOSIS — R202 Paresthesia of skin: Secondary | ICD-10-CM | POA: Diagnosis not present

## 2019-01-19 DIAGNOSIS — M6281 Muscle weakness (generalized): Secondary | ICD-10-CM | POA: Diagnosis not present

## 2019-01-23 DIAGNOSIS — M545 Low back pain: Secondary | ICD-10-CM | POA: Diagnosis not present

## 2019-01-23 DIAGNOSIS — M79605 Pain in left leg: Secondary | ICD-10-CM | POA: Diagnosis not present

## 2019-01-23 DIAGNOSIS — M6281 Muscle weakness (generalized): Secondary | ICD-10-CM | POA: Diagnosis not present

## 2019-01-23 DIAGNOSIS — R202 Paresthesia of skin: Secondary | ICD-10-CM | POA: Diagnosis not present

## 2019-01-24 ENCOUNTER — Inpatient Hospital Stay (HOSPITAL_BASED_OUTPATIENT_CLINIC_OR_DEPARTMENT_OTHER): Payer: Medicare Other | Admitting: Physician Assistant

## 2019-01-24 ENCOUNTER — Inpatient Hospital Stay: Payer: Medicare Other

## 2019-01-24 ENCOUNTER — Inpatient Hospital Stay: Payer: Medicare Other | Attending: Internal Medicine

## 2019-01-24 ENCOUNTER — Encounter: Payer: Self-pay | Admitting: Physician Assistant

## 2019-01-24 ENCOUNTER — Other Ambulatory Visit: Payer: Self-pay

## 2019-01-24 VITALS — BP 147/76 | HR 84 | Temp 99.2°F | Resp 20 | Ht 63.0 in | Wt 147.9 lb

## 2019-01-24 DIAGNOSIS — E7800 Pure hypercholesterolemia, unspecified: Secondary | ICD-10-CM | POA: Insufficient documentation

## 2019-01-24 DIAGNOSIS — C3431 Malignant neoplasm of lower lobe, right bronchus or lung: Secondary | ICD-10-CM | POA: Diagnosis not present

## 2019-01-24 DIAGNOSIS — Z923 Personal history of irradiation: Secondary | ICD-10-CM | POA: Insufficient documentation

## 2019-01-24 DIAGNOSIS — C3491 Malignant neoplasm of unspecified part of right bronchus or lung: Secondary | ICD-10-CM

## 2019-01-24 DIAGNOSIS — I10 Essential (primary) hypertension: Secondary | ICD-10-CM | POA: Diagnosis not present

## 2019-01-24 DIAGNOSIS — Z5111 Encounter for antineoplastic chemotherapy: Secondary | ICD-10-CM | POA: Insufficient documentation

## 2019-01-24 DIAGNOSIS — M8580 Other specified disorders of bone density and structure, unspecified site: Secondary | ICD-10-CM | POA: Diagnosis not present

## 2019-01-24 DIAGNOSIS — Z95828 Presence of other vascular implants and grafts: Secondary | ICD-10-CM

## 2019-01-24 DIAGNOSIS — C7951 Secondary malignant neoplasm of bone: Secondary | ICD-10-CM

## 2019-01-24 DIAGNOSIS — Z79899 Other long term (current) drug therapy: Secondary | ICD-10-CM | POA: Insufficient documentation

## 2019-01-24 LAB — CBC WITH DIFFERENTIAL (CANCER CENTER ONLY)
Abs Immature Granulocytes: 0.02 10*3/uL (ref 0.00–0.07)
Basophils Absolute: 0 10*3/uL (ref 0.0–0.1)
Basophils Relative: 0 %
Eosinophils Absolute: 0 10*3/uL (ref 0.0–0.5)
Eosinophils Relative: 0 %
HCT: 31.1 % — ABNORMAL LOW (ref 36.0–46.0)
Hemoglobin: 10.3 g/dL — ABNORMAL LOW (ref 12.0–15.0)
Immature Granulocytes: 0 %
Lymphocytes Relative: 13 %
Lymphs Abs: 0.8 10*3/uL (ref 0.7–4.0)
MCH: 33.9 pg (ref 26.0–34.0)
MCHC: 33.1 g/dL (ref 30.0–36.0)
MCV: 102.3 fL — ABNORMAL HIGH (ref 80.0–100.0)
Monocytes Absolute: 0.7 10*3/uL (ref 0.1–1.0)
Monocytes Relative: 11 %
Neutro Abs: 4.9 10*3/uL (ref 1.7–7.7)
Neutrophils Relative %: 76 %
Platelet Count: 253 10*3/uL (ref 150–400)
RBC: 3.04 MIL/uL — ABNORMAL LOW (ref 3.87–5.11)
RDW: 14.2 % (ref 11.5–15.5)
WBC Count: 6.4 10*3/uL (ref 4.0–10.5)
nRBC: 0 % (ref 0.0–0.2)

## 2019-01-24 LAB — CMP (CANCER CENTER ONLY)
ALT: 14 U/L (ref 0–44)
AST: 31 U/L (ref 15–41)
Albumin: 3.4 g/dL — ABNORMAL LOW (ref 3.5–5.0)
Alkaline Phosphatase: 67 U/L (ref 38–126)
Anion gap: 8 (ref 5–15)
BUN: 21 mg/dL (ref 8–23)
CO2: 24 mmol/L (ref 22–32)
Calcium: 9.4 mg/dL (ref 8.9–10.3)
Chloride: 105 mmol/L (ref 98–111)
Creatinine: 1.12 mg/dL — ABNORMAL HIGH (ref 0.44–1.00)
GFR, Est AFR Am: 57 mL/min — ABNORMAL LOW (ref >60)
GFR, Estimated: 49 mL/min — ABNORMAL LOW (ref >60)
Glucose, Bld: 112 mg/dL — ABNORMAL HIGH (ref 70–99)
Potassium: 3.9 mmol/L (ref 3.5–5.1)
Sodium: 137 mmol/L (ref 135–145)
Total Bilirubin: 0.5 mg/dL (ref 0.3–1.2)
Total Protein: 6.9 g/dL (ref 6.5–8.1)

## 2019-01-24 MED ORDER — SODIUM CHLORIDE 0.9% FLUSH
10.0000 mL | INTRAVENOUS | Status: DC | PRN
  Administered 2019-01-24: 10 mL
  Filled 2019-01-24: qty 10

## 2019-01-24 MED ORDER — ZOLEDRONIC ACID 4 MG/100ML IV SOLN
4.0000 mg | Freq: Once | INTRAVENOUS | Status: AC
  Administered 2019-01-24: 4 mg via INTRAVENOUS
  Filled 2019-01-24: qty 100

## 2019-01-24 MED ORDER — SODIUM CHLORIDE 0.9% FLUSH
10.0000 mL | Freq: Once | INTRAVENOUS | Status: AC
  Administered 2019-01-24: 10 mL
  Filled 2019-01-24: qty 10

## 2019-01-24 MED ORDER — HEPARIN SOD (PORK) LOCK FLUSH 100 UNIT/ML IV SOLN
500.0000 [IU] | Freq: Once | INTRAVENOUS | Status: AC | PRN
  Administered 2019-01-24: 500 [IU]
  Filled 2019-01-24: qty 5

## 2019-01-24 MED ORDER — SODIUM CHLORIDE 0.9 % IV SOLN
480.0000 mg/m2 | Freq: Once | INTRAVENOUS | Status: AC
  Administered 2019-01-24: 900 mg via INTRAVENOUS
  Filled 2019-01-24: qty 20

## 2019-01-24 MED ORDER — PROCHLORPERAZINE MALEATE 10 MG PO TABS
10.0000 mg | ORAL_TABLET | Freq: Once | ORAL | Status: AC
  Administered 2019-01-24: 10 mg via ORAL

## 2019-01-24 MED ORDER — PROCHLORPERAZINE MALEATE 10 MG PO TABS
ORAL_TABLET | ORAL | Status: AC
  Filled 2019-01-24: qty 1

## 2019-01-24 MED ORDER — SODIUM CHLORIDE 0.9 % IV SOLN
Freq: Once | INTRAVENOUS | Status: AC
  Administered 2019-01-24: 10:00:00 via INTRAVENOUS
  Filled 2019-01-24: qty 250

## 2019-01-24 NOTE — Progress Notes (Signed)
[facility] OFFICE PROGRESS NOTE  Paz, Jose E, MD [address] [place] [address]  DIAGNOSIS: Stage IV (T1b, N2, M1b) non-small cell lung cancer, adenocarcinoma diagnosed in March 2017 and presented with right lower lobe lung nodule in addition to mediastinal lymphadenopathy and metastatic bone lesions.  Molecular studies:PDL 1 TPS <1%.   [employer] Studies: Positive for ERBB2 [id number]. Negative for EGFR, KRAS, ALK, BRAF, MET, RET and ROS1.  PRIOR THERAPY: 1) Induction systemic chemotherapy with carboplatin for AUC of 5 and Alimta 500 MG/M2 every 3 weeks is status post 6 cycles at the [facility], last dose was given 03/05/2016 with stable disease. 2) palliative radiation to the metastatic bone disease in the lower back and pelvic area.  CURRENT THERAPY:  1)  Maintenance systemic chemotherapy with Alimta 500 MG/M2 every 3 weeks status post 49 cycles. First dose was given 03/25/2016.  2) Zometa 4 mg IV every 12 week for metastatic bone disease.  INTERVAL HISTORY: Valerie Long 73 y.o. female returns to the clinic for a follow-up visit.  The patient is feeling well today except for persistent low back pain secondary to multilevel degenerative changes with severe subarticular and foraminal stenosis at L4/L5 due to disc protrusion.  She is followed by Dr. Patel from neurology.  She also is undergoing physical therapy.  Since starting physical therapy, the patient believes that her pain has somewhat improved. She has a prescription of percocet to take for pain as needed. She was prescribed gabapentin but recently stopped taking it due to it causing her to feel dizzy. She states that her dizziness improved once she discontinued gabapentin.   Otherwise she is feeling well today.  She has been tolerated her last treatment well without any adverse side effects.  She denies any fever, chills, night sweats, or weight loss.  She denies any chest  pain, shortness of breath, cough, or hemoptysis.  She denies any nausea, vomiting, diarrhea, or constipation.  She denies any headache or visual changes.  She is here today for evaluation before starting cycle #50.   MEDICAL HISTORY: Past Medical History:  Diagnosis Date  . Adenocarcinoma of right lung, stage 4 (HCC) 2017  . Anemia   . Arthritis   . Bone metastases (HCC) 02/24/2017  . Encounter for antineoplastic chemotherapy 03/17/2016  . Hypercholesterolemia   . Hypertension 06/18/2016  . Osteopenia   . Pelvic kidney    Left. On CT in [place]  . Pneumonia   . Shortness of breath dyspnea     ALLERGIES:  has No Known Allergies.  MEDICATIONS:  Current Outpatient Medications  Medication Sig Dispense Refill  . acetaminophen (TYLENOL) 500 MG tablet Take 1,000 mg by mouth every 6 (six) hours as needed for moderate pain or headache.    . baclofen (LIORESAL) 10 MG tablet Take 1 tablet (10 mg total) by mouth 3 (three) times daily. 30 each 0  . Calcium Carb-Cholecalciferol (CALCIUM 600/VITAMIN D3 PO) Take by mouth.    . Cyanocobalamin (B-12 PO) Take 2 tablets by mouth daily.    . dexamethasone (DECADRON) 4 MG tablet Take 4mg by mouth twice daily the day before, of, and after chemo 40 tablet 1  . Fish Oil-Cholecalciferol (OMEGA-3 + VITAMIN D3 PO) Take 1 tablet by mouth daily.    . folic acid (FOLVITE) 1 MG tablet TAKE 1 TABLET BY MOUTH EVERY DAY 90 tablet 0  . lidocaine-prilocaine (EMLA) cream Apply 1 application topically as needed. Apply   1-2 tsp over port site 1-2 hours prior to chemo. 30 g 0  . meloxicam (MOBIC) 15 MG tablet TAKE 1 TABLET BY MOUTH EVERY DAY AS NEEDED FOR PAIN 30 tablet 0  . PRESCRIPTION MEDICATION Inject 1 Dose as directed every 21 ( twenty-one) days. Chemo    . ondansetron (ZOFRAN) 8 MG tablet Take 8 mg by mouth every 8 (eight) hours as needed.    . OVER THE COUNTER MEDICATION Apply 1 drop topically daily as needed. 1 drop CBD oil in vicks vapor rub . Applied  to right thigh for pain relief.    . oxyCODONE-acetaminophen (PERCOCET/ROXICET) 5-325 MG tablet Take 1 tablet by mouth every 8 (eight) hours as needed for severe pain. (Patient not taking: Reported on 01/24/2019) 30 tablet 0  . prochlorperazine (COMPAZINE) 10 MG tablet Take 1 tablet (10 mg total) by mouth every 6 (six) hours as needed for nausea or vomiting. (Patient not taking: Reported on 01/24/2019) 30 tablet 0   No current facility-administered medications for this visit.     SURGICAL HISTORY:  Past Surgical History:  Procedure Laterality Date  . CESAREAN SECTION     myomectomy  . COLONOSCOPY W/ POLYPECTOMY    . IR GENERIC HISTORICAL  08/17/2016   IR US GUIDE VASC ACCESS RIGHT 08/17/2016 Heath McCullough, MD [facility]  . IR GENERIC HISTORICAL  08/17/2016   IR FLUORO GUIDE PORT INSERTION RIGHT 08/17/2016 Heath McCullough, MD [facility]  . MEDIASTINOSCOPY N/A 09/25/2015   Procedure: MEDIASTINOSCOPY;  Surgeon: Steven C Hendrickson, MD;  Location: [facility] OR;  Service: Thoracic;  Laterality: N/A;  . VIDEO BRONCHOSCOPY Bilateral 08/19/2015   Procedure: VIDEO BRONCHOSCOPY WITH FLUORO;  Surgeon: Rakesh Alva V, MD;  Location: [facility] ENDOSCOPY;  Service: Cardiopulmonary;  Laterality: Bilateral;  . VIDEO BRONCHOSCOPY N/A 09/08/2015   Procedure: VIDEO BRONCHOSCOPY WITH FLUORO;  Surgeon: Rakesh Alva V, MD;  Location: [facility] OR;  Service: Thoracic;  Laterality: N/A;  . VIDEO BRONCHOSCOPY WITH ENDOBRONCHIAL ULTRASOUND Right 09/08/2015   Procedure: ATTEMPTED VIDEO BRONCHOSCOPY ENDOBRONCHIAL ULTRASOUND  ;  Surgeon: Rakesh Alva V, MD;  Location: [facility] OR;  Service: Thoracic;  Laterality: Right;    REVIEW OF SYSTEMS:   Review of Systems  Constitutional: Negative for appetite change, chills, fatigue, fever and unexpected weight change.  HENT:   Negative for mouth sores, nosebleeds, sore throat and trouble swallowing.   Eyes: Negative for eye problems and icterus.  Respiratory: Negative for cough, hemoptysis, shortness  of breath and wheezing.   Cardiovascular: Negative for chest pain and leg swelling.  Gastrointestinal: Negative for abdominal pain, constipation, diarrhea, nausea and vomiting.  Genitourinary: Negative for bladder incontinence, difficulty urinating, dysuria, frequency and hematuria.   Musculoskeletal: Positive for chronic back pain. Negative for gait problem, neck pain and neck stiffness.  Skin: Negative for itching and rash.  Neurological: Negative for dizziness, extremity weakness, gait problem, headaches, light-headedness and seizures.  Hematological: Negative for adenopathy. Does not bruise/bleed easily.  Psychiatric/Behavioral: Negative for confusion, depression and sleep disturbance. The patient is not nervous/anxious.     PHYSICAL EXAMINATION:  Blood pressure (!) 147/76, pulse 84, temperature 99.2 F (37.3 C), temperature source Oral, resp. rate 20, height 5' 3" (1.6 m), weight 147 lb 14.4 oz (67.1 kg), last menstrual period 06/09/1998, SpO2 100 %.  ECOG PERFORMANCE STATUS: 1 - Symptomatic but completely ambulatory  Physical Exam  Constitutional: Oriented to person, place, and time and well-developed, well-nourished, and in no distress.  HENT:  Head: Normocephalic and atraumatic.  Mouth/Throat: Oropharynx is   clear and moist. No oropharyngeal exudate.  Eyes: Conjunctivae are normal. Right eye exhibits no discharge. Left eye exhibits no discharge. No scleral icterus.  Neck: Normal range of motion. Neck supple.  Cardiovascular: Normal rate, regular rhythm, normal heart sounds and intact distal pulses.   Pulmonary/Chest: Effort normal and breath sounds normal. No respiratory distress. No wheezes. No rales.  Abdominal: Soft. Bowel sounds are normal. Exhibits no distension and no mass. There is no tenderness.  Musculoskeletal: Normal range of motion. Exhibits no edema.  Lymphadenopathy:    No cervical adenopathy.  Neurological: Alert and oriented to person, place, and time. Exhibits  normal muscle tone. Gait normal. Coordination normal.  Skin: Skin is warm and dry. No rash noted. Not diaphoretic. No erythema. No pallor.  Psychiatric: Mood, memory and judgment normal.  Vitals reviewed.  LABORATORY DATA: Lab Results  Component Value Date   WBC 6.4 01/24/2019   HGB 10.3 (L) 01/24/2019   HCT 31.1 (L) 01/24/2019   MCV 102.3 (H) 01/24/2019   PLT 253 01/24/2019      Chemistry      Component Value Date/Time   NA 137 01/24/2019 0844   NA 137 06/09/2017 0901   K 3.9 01/24/2019 0844   K 3.9 06/09/2017 0901   CL 105 01/24/2019 0844   CO2 24 01/24/2019 0844   CO2 25 06/09/2017 0901   BUN 21 01/24/2019 0844   BUN 19.0 06/09/2017 0901   CREATININE 1.12 (H) 01/24/2019 0844   CREATININE 1.2 (H) 06/09/2017 0901   GLU 170 01/23/2016      Component Value Date/Time   CALCIUM 9.4 01/24/2019 0844   CALCIUM 9.9 06/09/2017 0901   ALKPHOS 67 01/24/2019 0844   ALKPHOS 63 06/09/2017 0901   AST 31 01/24/2019 0844   AST 35 (H) 06/09/2017 0901   ALT 14 01/24/2019 0844   ALT 23 06/09/2017 0901   BILITOT 0.5 01/24/2019 0844   BILITOT 0.65 06/09/2017 0901       RADIOGRAPHIC STUDIES:  Mr Lumbar Spine W Wo Contrast  Result Date: 01/02/2019 CLINICAL DATA:  Lumbar radiculopathy. Left leg pain. History of metastatic lung cancer. History of lumbar spine radiation. EXAM: MRI LUMBAR SPINE WITHOUT AND WITH CONTRAST TECHNIQUE: Multiplanar and multiecho pulse sequences of the lumbar spine were obtained without and with intravenous contrast. CONTRAST:  13mL MULTIHANCE GADOBENATE DIMEGLUMINE 529 MG/ML IV SOLN COMPARISON:  No prior MRI for comparison. CT abdomen pelvis 12/11/2018 FINDINGS: Segmentation:  Normal Alignment: Moderate levoscoliosis lumbar spine. Mild retrolisthesis L1-2. Mild anterolisthesis L2-3 Vertebrae: Fatty changes in the bone marrow L1 through L5 consistent with prior radiation change. Multiple small sclerotic lesions are present in lumbar vertebral bodies as confirmed on  prior CT. This is likely due to treated metastatic disease. No change from the prior CT. Negative for fracture. Conus medullaris and cauda equina: Conus extends to the T12-L1 level. Conus and cauda equina appear normal. Paraspinal and other soft tissues: Left pelvic kidney. No retroperitoneal mass or adenopathy. Disc levels: T12-L1: Negative for stenosis L1-2: Disc degeneration with diffuse bulging of the disc and endplate spurring. Bilateral facet hypertrophy. Mild spinal stenosis. Moderate subarticular and foraminal stenosis on the right L2-3: Disc degeneration and asymmetric disc bulging and spurring on the right. Moderate spinal stenosis. Moderate facet hypertrophy. Moderate subarticular and foraminal stenosis on the right L3-4: Disc degeneration with diffuse disc bulging and endplate spurring. Bilateral facet hypertrophy. Moderate spinal stenosis. Moderate subarticular and foraminal stenosis bilaterally L4-5: The asymmetric disc degeneration on the left. Left foraminal disc   protrusion with compression of the left L4 and L5 nerve roots. Bilateral facet hypertrophy. Mild spinal stenosis. Severe subarticular and foraminal stenosis on the left. L5-S1: Left foraminal disc protrusion. Moderate subarticular and foraminal stenosis on the left. IMPRESSION: 1. Bone marrow changes of prior lumbar radiation. Multiple sclerotic small lesions throughout the lumbar vertebra compatible with treated metastatic disease. Negative for fracture. 2. Scoliosis and multilevel degenerative change as above. Multilevel spinal and foraminal stenosis. 3. With regard to the patient's left leg pain, there is severe subarticular and foraminal stenosis on the left at L4-5 due to disc protrusion and facet hypertrophy. Electronically Signed   By: Franchot Gallo M.D.   On: 01/02/2019 11:07     ASSESSMENT/PLAN:  This is a very pleasant 73 year old Hispanic female diagnosed with metastatic non-small cell lung cancer, adenocarcinoma. She  presented with a right lower lobe lung nodule in addition to mediastinal lymphadenopathy and metastatic bone lesions. She was diagnosed in March 2017. Her PDL 1 expression is <1% and she has no actionable mutations.  She previously underwent inductiontreatmentwith systemic chemotherapy with Carboplatin and Alimta. He is currently undergoing maintenance therapy withAlimta. She is status post 49 cycles. The patient has been tolerating this treatment fairly well without any concerning adverse effects.   The patient was seen with Dr. Julien Nordmann today.  Labs were reviewed.  We recommend that she proceed with cycle #50 today scheduled.   We will see the patient back for follow-up visit in 3 weeks for evaluation before starting cycle #51  The patient will continue to take her folic acid daily. The patient had some question as to how to take her decadron. I reinforced that she is to take a 4 mg tablet twice a day the day before chemotherapy, the day of chemotherapy, and the day after chemotherapy.    The patient will continue to receive Zometa 4 mg IV every 12 weeks for her metastatic bone disease. She will receive Zometa today.    She will continue to follow with neurology for her back pain. She will continue with phsyical therapy.  The patient was advised to call immediately if she has any concerning symptoms in the interval. The patient voices understanding of current disease status and treatment options and is in agreement with the current care plan. All questions were answered. The patient knows to call the clinic with any problems, questions or concerns. We can certainly see the patient much sooner if necessary   No orders of the defined types were placed in this encounter.     L , PA-C 01/24/19  ADDENDUM: Hematology/Oncology Attending: I had a face-to-face encounter with the patient today.  I recommended her care plan.  This is a very pleasant 73 years old Hispanic  female with metastatic non-small cell lung cancer, adenocarcinoma diagnosed in March 2017.  She is status post induction systemic chemotherapy with carboplatin and Alimta and she is currently on maintenance treatment with Alimta every 3 weeks status post 49 cycles.  She has been tolerating this treatment well with no concerning adverse effects.  The patient continues to have persistent low back pain but she is currently undergoing physical therapy and she is feeling much better. I recommended for the patient to proceed with cycle #15 today as planned. We will see her back for follow-up visit in 3 weeks for evaluation before the next cycle of her treatment. For the metastatic bone disease she will continue her current treatment with Zometa every 3 months. She was advised to  call immediately if she has any concerning symptoms in the interval.  Disclaimer: This note was dictated with voice recognition software. Similar sounding words can inadvertently be transcribed and may be missed upon review. Mohamed K Mohamed, MD 01/24/19  

## 2019-01-24 NOTE — Patient Instructions (Signed)
[facility] Discharge Instructions for Patients Receiving Chemotherapy  Today you received the following chemotherapy agents: Alimta  To help prevent nausea and vomiting after your treatment, we encourage you to take your nausea medication as directed.   If you develop nausea and vomiting that is not controlled by your nausea medication, call the clinic.   BELOW ARE SYMPTOMS THAT SHOULD BE REPORTED IMMEDIATELY:  *FEVER GREATER THAN 100.5 F  *CHILLS WITH OR WITHOUT FEVER  NAUSEA AND VOMITING THAT IS NOT CONTROLLED WITH YOUR NAUSEA MEDICATION  *UNUSUAL SHORTNESS OF BREATH  *UNUSUAL BRUISING OR BLEEDING  TENDERNESS IN MOUTH AND THROAT WITH OR WITHOUT PRESENCE OF ULCERS  *URINARY PROBLEMS  *BOWEL PROBLEMS  UNUSUAL RASH Items with * indicate a potential emergency and should be followed up as soon as possible.  Feel free to call the clinic should you have any questions or concerns. The clinic phone number is [phone].  Please show the [facility] at check-in to the Emergency Department and triage nurse.

## 2019-01-24 NOTE — Progress Notes (Signed)
Patient due for zometa 01/24/19. Cassie called to confirm this as well.   Demetrius Charity, PharmD, [facility] Pharmacist Pharmacy Phone: [phone] 01/24/2019

## 2019-01-31 DIAGNOSIS — M6281 Muscle weakness (generalized): Secondary | ICD-10-CM | POA: Diagnosis not present

## 2019-01-31 DIAGNOSIS — M79605 Pain in left leg: Secondary | ICD-10-CM | POA: Diagnosis not present

## 2019-01-31 DIAGNOSIS — R202 Paresthesia of skin: Secondary | ICD-10-CM | POA: Diagnosis not present

## 2019-01-31 DIAGNOSIS — M545 Low back pain: Secondary | ICD-10-CM | POA: Diagnosis not present

## 2019-02-05 DIAGNOSIS — R202 Paresthesia of skin: Secondary | ICD-10-CM | POA: Diagnosis not present

## 2019-02-05 DIAGNOSIS — M6281 Muscle weakness (generalized): Secondary | ICD-10-CM | POA: Diagnosis not present

## 2019-02-05 DIAGNOSIS — M545 Low back pain: Secondary | ICD-10-CM | POA: Diagnosis not present

## 2019-02-05 DIAGNOSIS — M79605 Pain in left leg: Secondary | ICD-10-CM | POA: Diagnosis not present

## 2019-02-13 DIAGNOSIS — R202 Paresthesia of skin: Secondary | ICD-10-CM | POA: Diagnosis not present

## 2019-02-13 DIAGNOSIS — M79605 Pain in left leg: Secondary | ICD-10-CM | POA: Diagnosis not present

## 2019-02-13 DIAGNOSIS — M6281 Muscle weakness (generalized): Secondary | ICD-10-CM | POA: Diagnosis not present

## 2019-02-13 DIAGNOSIS — M545 Low back pain: Secondary | ICD-10-CM | POA: Diagnosis not present

## 2019-02-14 ENCOUNTER — Encounter: Payer: Self-pay | Admitting: Internal Medicine

## 2019-02-14 ENCOUNTER — Other Ambulatory Visit: Payer: Self-pay

## 2019-02-14 ENCOUNTER — Inpatient Hospital Stay: Payer: Medicare Other

## 2019-02-14 ENCOUNTER — Telehealth: Payer: Self-pay | Admitting: Internal Medicine

## 2019-02-14 ENCOUNTER — Inpatient Hospital Stay: Payer: Medicare Other | Attending: Internal Medicine

## 2019-02-14 ENCOUNTER — Inpatient Hospital Stay (HOSPITAL_BASED_OUTPATIENT_CLINIC_OR_DEPARTMENT_OTHER): Payer: Medicare Other | Admitting: Internal Medicine

## 2019-02-14 VITALS — BP 137/74 | HR 77 | Temp 97.3°F | Resp 18 | Ht 63.0 in | Wt 149.0 lb

## 2019-02-14 DIAGNOSIS — C7951 Secondary malignant neoplasm of bone: Secondary | ICD-10-CM | POA: Diagnosis not present

## 2019-02-14 DIAGNOSIS — Z5111 Encounter for antineoplastic chemotherapy: Secondary | ICD-10-CM | POA: Insufficient documentation

## 2019-02-14 DIAGNOSIS — C3491 Malignant neoplasm of unspecified part of right bronchus or lung: Secondary | ICD-10-CM | POA: Diagnosis not present

## 2019-02-14 DIAGNOSIS — D6481 Anemia due to antineoplastic chemotherapy: Secondary | ICD-10-CM

## 2019-02-14 DIAGNOSIS — Z791 Long term (current) use of non-steroidal anti-inflammatories (NSAID): Secondary | ICD-10-CM | POA: Insufficient documentation

## 2019-02-14 DIAGNOSIS — Z79899 Other long term (current) drug therapy: Secondary | ICD-10-CM | POA: Insufficient documentation

## 2019-02-14 DIAGNOSIS — M8580 Other specified disorders of bone density and structure, unspecified site: Secondary | ICD-10-CM | POA: Insufficient documentation

## 2019-02-14 DIAGNOSIS — E7800 Pure hypercholesterolemia, unspecified: Secondary | ICD-10-CM | POA: Diagnosis not present

## 2019-02-14 DIAGNOSIS — M545 Low back pain: Secondary | ICD-10-CM | POA: Insufficient documentation

## 2019-02-14 DIAGNOSIS — C3431 Malignant neoplasm of lower lobe, right bronchus or lung: Secondary | ICD-10-CM | POA: Diagnosis not present

## 2019-02-14 DIAGNOSIS — Z95828 Presence of other vascular implants and grafts: Secondary | ICD-10-CM

## 2019-02-14 DIAGNOSIS — T451X5A Adverse effect of antineoplastic and immunosuppressive drugs, initial encounter: Secondary | ICD-10-CM

## 2019-02-14 LAB — CMP (CANCER CENTER ONLY)
ALT: 13 U/L (ref 0–44)
AST: 29 U/L (ref 15–41)
Albumin: 3.6 g/dL (ref 3.5–5.0)
Alkaline Phosphatase: 62 U/L (ref 38–126)
Anion gap: 8 (ref 5–15)
BUN: 19 mg/dL (ref 8–23)
CO2: 25 mmol/L (ref 22–32)
Calcium: 9.4 mg/dL (ref 8.9–10.3)
Chloride: 104 mmol/L (ref 98–111)
Creatinine: 1.09 mg/dL — ABNORMAL HIGH (ref 0.44–1.00)
GFR, Est AFR Am: 59 mL/min — ABNORMAL LOW (ref >60)
GFR, Estimated: 51 mL/min — ABNORMAL LOW (ref >60)
Glucose, Bld: 116 mg/dL — ABNORMAL HIGH (ref 70–99)
Potassium: 4 mmol/L (ref 3.5–5.1)
Sodium: 137 mmol/L (ref 135–145)
Total Bilirubin: 0.4 mg/dL (ref 0.3–1.2)
Total Protein: 6.9 g/dL (ref 6.5–8.1)

## 2019-02-14 LAB — CBC WITH DIFFERENTIAL (CANCER CENTER ONLY)
Basophils Absolute: 0 10*3/uL (ref 0.0–0.1)
Basophils Relative: 0 %
Eosinophils Absolute: 0 10*3/uL (ref 0.0–0.5)
Eosinophils Relative: 0 %
HCT: 30.7 % — ABNORMAL LOW (ref 36.0–46.0)
Hemoglobin: 10.2 g/dL — ABNORMAL LOW (ref 12.0–15.0)
Lymphocytes Relative: 13 %
Lymphs Abs: 0.7 10*3/uL (ref 0.7–4.0)
MCH: 34.5 pg — ABNORMAL HIGH (ref 26.0–34.0)
MCHC: 33.2 g/dL (ref 30.0–36.0)
MCV: 103.7 fL — ABNORMAL HIGH (ref 80.0–100.0)
Monocytes Absolute: 0.5 10*3/uL (ref 0.1–1.0)
Monocytes Relative: 10 %
Neutro Abs: 3.7 10*3/uL (ref 1.7–7.7)
Neutrophils Relative %: 76 %
Platelet Count: 266 10*3/uL (ref 150–400)
RBC: 2.96 MIL/uL — ABNORMAL LOW (ref 3.87–5.11)
RDW: 13.8 % (ref 11.5–15.5)
WBC Count: 4.9 10*3/uL (ref 4.0–10.5)
nRBC: 0 % (ref 0.0–0.2)

## 2019-02-14 MED ORDER — SODIUM CHLORIDE 0.9% FLUSH
10.0000 mL | Freq: Once | INTRAVENOUS | Status: AC
  Administered 2019-02-14: 10 mL
  Filled 2019-02-14: qty 10

## 2019-02-14 MED ORDER — SODIUM CHLORIDE 0.9 % IV SOLN
900.0000 mg | Freq: Once | INTRAVENOUS | Status: AC
  Administered 2019-02-14: 900 mg via INTRAVENOUS
  Filled 2019-02-14: qty 16

## 2019-02-14 MED ORDER — SODIUM CHLORIDE 0.9% FLUSH
10.0000 mL | INTRAVENOUS | Status: DC | PRN
  Administered 2019-02-14: 10 mL
  Filled 2019-02-14: qty 10

## 2019-02-14 MED ORDER — PROCHLORPERAZINE MALEATE 10 MG PO TABS
ORAL_TABLET | ORAL | Status: AC
  Filled 2019-02-14: qty 1

## 2019-02-14 MED ORDER — PROCHLORPERAZINE MALEATE 10 MG PO TABS
10.0000 mg | ORAL_TABLET | Freq: Once | ORAL | Status: AC
  Administered 2019-02-14: 10 mg via ORAL

## 2019-02-14 MED ORDER — SODIUM CHLORIDE 0.9 % IV SOLN
Freq: Once | INTRAVENOUS | Status: AC
  Administered 2019-02-14: 11:00:00 via INTRAVENOUS
  Filled 2019-02-14: qty 250

## 2019-02-14 MED ORDER — HEPARIN SOD (PORK) LOCK FLUSH 100 UNIT/ML IV SOLN
500.0000 [IU] | Freq: Once | INTRAVENOUS | Status: AC | PRN
  Administered 2019-02-14: 12:00:00 500 [IU]
  Filled 2019-02-14: qty 5

## 2019-02-14 NOTE — Patient Instructions (Signed)
[facility] Discharge Instructions for Patients Receiving Chemotherapy  Today you received the following chemotherapy agents: Alimta  To help prevent nausea and vomiting after your treatment, we encourage you to take your nausea medication as directed.   If you develop nausea and vomiting that is not controlled by your nausea medication, call the clinic.   BELOW ARE SYMPTOMS THAT SHOULD BE REPORTED IMMEDIATELY:  *FEVER GREATER THAN 100.5 F  *CHILLS WITH OR WITHOUT FEVER  NAUSEA AND VOMITING THAT IS NOT CONTROLLED WITH YOUR NAUSEA MEDICATION  *UNUSUAL SHORTNESS OF BREATH  *UNUSUAL BRUISING OR BLEEDING  TENDERNESS IN MOUTH AND THROAT WITH OR WITHOUT PRESENCE OF ULCERS  *URINARY PROBLEMS  *BOWEL PROBLEMS  UNUSUAL RASH Items with * indicate a potential emergency and should be followed up as soon as possible.  Feel free to call the clinic should you have any questions or concerns. The clinic phone number is [phone].  Please show the [facility] at check-in to the Emergency Department and triage nurse.

## 2019-02-14 NOTE — Patient Instructions (Signed)

## 2019-02-14 NOTE — Progress Notes (Signed)
[place] Telephone:(336) [phone]   Fax:(336) [place], MD [place] [address] [place] [address]  DIAGNOSIS: Stage IV (T1b, N2, M1b) non-small cell lung cancer, adenocarcinoma diagnosed in March 2017 and presented with right lower lobe lung nodule in addition to mediastinal lymphadenopathy and metastatic bone lesions.  Molecular studies: PDL 1 TPS  <1%.   Foundation One Studies: Positive for ERBB2 [id number]. Negative for EGFR, KRAS, ALK, BRAF, MET, RET and ROS1.  PRIOR THERAPY:  1) Induction systemic chemotherapy with carboplatin for AUC of 5 and Alimta 500 MG/M2 every 3 weeks is status post 6 cycles at the [facility], last dose was given 03/05/2016 with stable disease. 2) palliative radiation to the metastatic bone disease in the lower back and pelvic area.  CURRENT THERAPY::  1)  Maintenance systemic chemotherapy with Alimta 500 MG/M2 every 3 weeks status post 50 cycles. First dose was given 03/25/2016.  2) Zometa 4 mg IV every 12 week for metastatic bone disease.  INTERVAL HIST Valerie Long 73 y.o. female returns to the clinic today for follow-up visit.  The patient is feeling fine today with no concerning complaints except for the persistent low back pain and mild fatigue.  She tolerated the last cycle of her maintenance treatment fairly well.  She denied having any fever or chills.  She has no chest pain, shortness of breath, cough or hemoptysis.  She has no nausea, vomiting, diarrhea or constipation.  She is here today for evaluation before starting cycle #51.  MEDICAL HISTORY: Past Medical History:  Diagnosis Date  . Adenocarcinoma of right lung, stage 4 ([place]) 2017  . Anemia   . Arthritis   . Bone metastases ([place]) 02/24/2017  . Encounter for antineoplastic chemotherapy 03/17/2016  . Hypercholesterolemia   . Hypertension 06/18/2016  . Osteopenia   . Pelvic kidney    Left. [place]  . Pneumonia   . Shortness of breath dyspnea     ALLERGIES:  has No Known Allergies.  MEDICATIONS:  Current Outpatient Medications  Medication Sig Dispense Refill  . acetaminophen (TYLENOL) 500 MG tablet Take 1,000 mg by mouth every 6 (six) hours as needed for moderate pain or headache.    . baclofen (LIORESAL) 10 MG tablet Take 1 tablet (10 mg total) by mouth 3 (three) times daily. 30 each 0  . Calcium Carb-Cholecalciferol (CALCIUM 600/VITAMIN D3 PO) Take by mouth.    . Cyanocobalamin (B-12 PO) Take 2 tablets by mouth daily.    Marland Kitchen dexamethasone (DECADRON) 4 MG tablet Take 60m by mouth twice daily the day before, of, and after chemo 40 tablet 1  . Fish Oil-Cholecalciferol (OMEGA-3 + VITAMIN D3 PO) Take 1 tablet by mouth daily.    . folic acid (FOLVITE) 1 MG tablet TAKE 1 TABLET BY MOUTH EVERY DAY 90 tablet 0  . lidocaine-prilocaine (EMLA) cream Apply 1 application topically as needed. Apply 1-2 tsp over port site 1-2 hours prior to chemo. 30 g 0  . meloxicam (MOBIC) 15 MG tablet TAKE 1 TABLET BY MOUTH EVERY DAY AS NEEDED FOR PAIN 30 tablet 0  . ondansetron (ZOFRAN) 8 MG tablet Take 8 mg by mouth every 8 (eight) hours as needed.    .Marland KitchenOVER THE COUNTER MEDICATION Apply 1 drop topically daily as needed. 1 drop CBD oil in vicks vapor rub . Applied to right thigh for pain relief.    .Marland Kitchen  oxyCODONE-acetaminophen (PERCOCET/ROXICET) 5-325 MG tablet Take 1 tablet by mouth every 8 (eight) hours as needed for severe pain. 30 tablet 0  . PRESCRIPTION MEDICATION Inject 1 Dose as directed every 21 ( twenty-one) days. Chemo    . prochlorperazine (COMPAZINE) 10 MG tablet Take 1 tablet (10 mg total) by mouth every 6 (six) hours as needed for nausea or vomiting. 30 tablet 0   No current facility-administered medications for this visit.     SURGICAL HISTORY:  Past Surgical History:  Procedure Laterality Date  . CESAREAN SECTION     myomectomy  . COLONOSCOPY W/ POLYPECTOMY    . IR GENERIC  HISTORICAL  08/17/2016   IR US GUIDE VASC ACCESS RIGHT 08/17/2016 [name], MD [facility]  . IR GENERIC HISTORICAL  08/17/2016   IR FLUORO GUIDE PORT INSERTION RIGHT 08/17/2016 [name], MD [facility]  . MEDIASTINOSCOPY N/A 09/25/2015   Procedure: MEDIASTINOSCOPY;  Surgeon: Melrose Nakayama, MD;  Location: [facility];  Service: Thoracic;  Laterality: N/A;  . VIDEO BRONCHOSCOPY Bilateral 08/19/2015   Procedure: VIDEO BRONCHOSCOPY WITH FLUORO;  Surgeon: Rigoberto Noel, MD;  Location: [facility];  Service: Cardiopulmonary;  Laterality: Bilateral;  . VIDEO BRONCHOSCOPY N/A 09/08/2015   Procedure: VIDEO BRONCHOSCOPY WITH FLUORO;  Surgeon: Rigoberto Noel, MD;  Location: [facility];  Service: Thoracic;  Laterality: N/A;  . VIDEO BRONCHOSCOPY WITH ENDOBRONCHIAL ULTRASOUND Right 09/08/2015   Procedure: ATTEMPTED VIDEO BRONCHOSCOPY ENDOBRONCHIAL ULTRASOUND  ;  Surgeon: Rigoberto Noel, MD;  Location: [facility];  Service: Thoracic;  Laterality: Right;    REVIEW OF SYSTEMS:  A comprehensive review of systems was negative except for: Constitutional: positive for fatigue Musculoskeletal: positive for back pain   PHYSICAL EXAMINATION: General appearance: alert, cooperative, fatigued and no distress Head: Normocephalic, without obvious abnormality, atraumatic Neck: no adenopathy, no JVD, supple, symmetrical, trachea midline and thyroid not enlarged, symmetric, no tenderness/mass/nodules Lymph nodes: Cervical, supraclavicular, and axillary nodes normal. Resp: clear to auscultation bilaterally Back: symmetric, no curvature. ROM normal. No CVA tenderness. Cardio: regular rate and rhythm, S1, S2 normal, no murmur, click, rub or gallop GI: soft, non-tender; bowel sounds normal; no masses,  no organomegaly Extremities: extremities normal, atraumatic, no cyanosis or edema  ECOG PERFORMANCE STATUS: 1 - Symptomatic but completely ambulatory  Blood pressure 137/74, pulse 77, temperature (!) 97.3 F (36.3 C),  temperature source Oral, resp. rate 18, height '5\' 3"'  (1.6 m), weight 149 lb (67.6 kg), last menstrual period 06/09/1998, SpO2 100 %.  LABORATORY DATA: Lab Results  Component Value Date   WBC 4.9 02/14/2019   HGB 10.2 (L) 02/14/2019   HCT 30.7 (L) 02/14/2019   MCV 103.7 (H) 02/14/2019   PLT 266 02/14/2019      Chemistry      Component Value Date/Time   NA 137 02/14/2019 0908   NA 137 06/09/2017 0901   K 4.0 02/14/2019 0908   K 3.9 06/09/2017 0901   CL 104 02/14/2019 0908   CO2 25 02/14/2019 0908   CO2 25 06/09/2017 0901   BUN 19 02/14/2019 0908   BUN 19.0 06/09/2017 0901   CREATININE 1.09 (H) 02/14/2019 0908   CREATININE 1.2 (H) 06/09/2017 0901   GLU 170 01/23/2016      Component Value Date/Time   CALCIUM 9.4 02/14/2019 0908   CALCIUM 9.9 06/09/2017 0901   ALKPHOS 62 02/14/2019 0908   ALKPHOS 63 06/09/2017 0901   AST 29 02/14/2019 0908   AST 35 (H) 06/09/2017 0901   ALT 13  02/14/2019 0908   ALT 23 06/09/2017 0901   BILITOT 0.4 02/14/2019 0908   BILITOT 0.65 06/09/2017 0901       RADIOGRAPHIC STUDIES: No results found.  ASSESSMENT AND PLAN:  This is a very pleasant 73 years old Hispanic female with metastatic non-small cell lung cancer, adenocarcinoma status post induction systemic chemotherapy with carboplatin and Alimta and she is currently on maintenance treatment with single agent Alimta status post 50 cycles. The patient continues to tolerate her treatment well with no concerning adverse effects. I recommended for her to proceed with cycle #51 today as planned. For the low back pain, she will continue her current treatment with gabapentin as well as Percocet on as-needed basis. She will come back for follow-up visit in 3 weeks for evaluation before the next cycle of her treatment. The patient was advised to call immediately if she has any concerning symptoms in the interval. The patient voices understanding of current disease status and treatment options and is  in agreement with the current care plan. All questions were answered. The patient knows to call the clinic with any problems, questions or concerns. We can certainly see the patient much sooner if necessary.  Disclaimer: This note was dictated with voice recognition software. Similar sounding words can inadvertently be transcribed and may not be corrected upon review.

## 2019-02-14 NOTE — Telephone Encounter (Signed)
Scheduled appt per 9/9 los - pt to get an updated schedule next visit.

## 2019-02-21 DIAGNOSIS — M6281 Muscle weakness (generalized): Secondary | ICD-10-CM | POA: Diagnosis not present

## 2019-02-21 DIAGNOSIS — R202 Paresthesia of skin: Secondary | ICD-10-CM | POA: Diagnosis not present

## 2019-02-21 DIAGNOSIS — M79605 Pain in left leg: Secondary | ICD-10-CM | POA: Diagnosis not present

## 2019-02-21 DIAGNOSIS — M545 Low back pain: Secondary | ICD-10-CM | POA: Diagnosis not present

## 2019-02-23 DIAGNOSIS — R202 Paresthesia of skin: Secondary | ICD-10-CM | POA: Diagnosis not present

## 2019-02-23 DIAGNOSIS — M6281 Muscle weakness (generalized): Secondary | ICD-10-CM | POA: Diagnosis not present

## 2019-02-23 DIAGNOSIS — M79605 Pain in left leg: Secondary | ICD-10-CM | POA: Diagnosis not present

## 2019-02-23 DIAGNOSIS — M545 Low back pain: Secondary | ICD-10-CM | POA: Diagnosis not present

## 2019-02-27 DIAGNOSIS — R202 Paresthesia of skin: Secondary | ICD-10-CM | POA: Diagnosis not present

## 2019-02-27 DIAGNOSIS — M6281 Muscle weakness (generalized): Secondary | ICD-10-CM | POA: Diagnosis not present

## 2019-02-27 DIAGNOSIS — M545 Low back pain: Secondary | ICD-10-CM | POA: Diagnosis not present

## 2019-02-27 DIAGNOSIS — M79605 Pain in left leg: Secondary | ICD-10-CM | POA: Diagnosis not present

## 2019-03-01 DIAGNOSIS — R202 Paresthesia of skin: Secondary | ICD-10-CM | POA: Diagnosis not present

## 2019-03-01 DIAGNOSIS — M6281 Muscle weakness (generalized): Secondary | ICD-10-CM | POA: Diagnosis not present

## 2019-03-01 DIAGNOSIS — M545 Low back pain: Secondary | ICD-10-CM | POA: Diagnosis not present

## 2019-03-01 DIAGNOSIS — M79605 Pain in left leg: Secondary | ICD-10-CM | POA: Diagnosis not present

## 2019-03-06 DIAGNOSIS — M79605 Pain in left leg: Secondary | ICD-10-CM | POA: Diagnosis not present

## 2019-03-06 DIAGNOSIS — M6281 Muscle weakness (generalized): Secondary | ICD-10-CM | POA: Diagnosis not present

## 2019-03-06 DIAGNOSIS — M545 Low back pain: Secondary | ICD-10-CM | POA: Diagnosis not present

## 2019-03-06 DIAGNOSIS — R202 Paresthesia of skin: Secondary | ICD-10-CM | POA: Diagnosis not present

## 2019-03-07 ENCOUNTER — Telehealth: Payer: Self-pay | Admitting: Internal Medicine

## 2019-03-07 ENCOUNTER — Other Ambulatory Visit: Payer: Self-pay

## 2019-03-07 ENCOUNTER — Encounter: Payer: Self-pay | Admitting: Internal Medicine

## 2019-03-07 ENCOUNTER — Inpatient Hospital Stay: Payer: Medicare Other

## 2019-03-07 ENCOUNTER — Inpatient Hospital Stay (HOSPITAL_BASED_OUTPATIENT_CLINIC_OR_DEPARTMENT_OTHER): Payer: Medicare Other | Admitting: Internal Medicine

## 2019-03-07 DIAGNOSIS — C3491 Malignant neoplasm of unspecified part of right bronchus or lung: Secondary | ICD-10-CM

## 2019-03-07 DIAGNOSIS — C7951 Secondary malignant neoplasm of bone: Secondary | ICD-10-CM

## 2019-03-07 DIAGNOSIS — M8580 Other specified disorders of bone density and structure, unspecified site: Secondary | ICD-10-CM | POA: Diagnosis not present

## 2019-03-07 DIAGNOSIS — M545 Low back pain: Secondary | ICD-10-CM | POA: Diagnosis not present

## 2019-03-07 DIAGNOSIS — C3431 Malignant neoplasm of lower lobe, right bronchus or lung: Secondary | ICD-10-CM | POA: Diagnosis not present

## 2019-03-07 DIAGNOSIS — C3490 Malignant neoplasm of unspecified part of unspecified bronchus or lung: Secondary | ICD-10-CM | POA: Diagnosis not present

## 2019-03-07 DIAGNOSIS — Z5111 Encounter for antineoplastic chemotherapy: Secondary | ICD-10-CM

## 2019-03-07 DIAGNOSIS — Z95828 Presence of other vascular implants and grafts: Secondary | ICD-10-CM

## 2019-03-07 DIAGNOSIS — E7800 Pure hypercholesterolemia, unspecified: Secondary | ICD-10-CM | POA: Diagnosis not present

## 2019-03-07 LAB — CMP (CANCER CENTER ONLY)
ALT: 14 U/L (ref 0–44)
AST: 28 U/L (ref 15–41)
Albumin: 3.5 g/dL (ref 3.5–5.0)
Alkaline Phosphatase: 62 U/L (ref 38–126)
Anion gap: 10 (ref 5–15)
BUN: 19 mg/dL (ref 8–23)
CO2: 23 mmol/L (ref 22–32)
Calcium: 9 mg/dL (ref 8.9–10.3)
Chloride: 104 mmol/L (ref 98–111)
Creatinine: 1.07 mg/dL — ABNORMAL HIGH (ref 0.44–1.00)
GFR, Est AFR Am: >60 mL/min (ref >60)
GFR, Estimated: 52 mL/min — ABNORMAL LOW (ref >60)
Glucose, Bld: 141 mg/dL — ABNORMAL HIGH (ref 70–99)
Potassium: 3.9 mmol/L (ref 3.5–5.1)
Sodium: 137 mmol/L (ref 135–145)
Total Bilirubin: 0.5 mg/dL (ref 0.3–1.2)
Total Protein: 6.8 g/dL (ref 6.5–8.1)

## 2019-03-07 LAB — CBC WITH DIFFERENTIAL (CANCER CENTER ONLY)
Abs Immature Granulocytes: 0.02 10*3/uL (ref 0.00–0.07)
Basophils Absolute: 0 10*3/uL (ref 0.0–0.1)
Basophils Relative: 0 %
Eosinophils Absolute: 0 10*3/uL (ref 0.0–0.5)
Eosinophils Relative: 0 %
HCT: 32.1 % — ABNORMAL LOW (ref 36.0–46.0)
Hemoglobin: 10.5 g/dL — ABNORMAL LOW (ref 12.0–15.0)
Immature Granulocytes: 0 %
Lymphocytes Relative: 13 %
Lymphs Abs: 0.7 10*3/uL (ref 0.7–4.0)
MCH: 33.7 pg (ref 26.0–34.0)
MCHC: 32.7 g/dL (ref 30.0–36.0)
MCV: 102.9 fL — ABNORMAL HIGH (ref 80.0–100.0)
Monocytes Absolute: 0.5 10*3/uL (ref 0.1–1.0)
Monocytes Relative: 8 %
Neutro Abs: 4.3 10*3/uL (ref 1.7–7.7)
Neutrophils Relative %: 79 %
Platelet Count: 253 10*3/uL (ref 150–400)
RBC: 3.12 MIL/uL — ABNORMAL LOW (ref 3.87–5.11)
RDW: 13.9 % (ref 11.5–15.5)
WBC Count: 5.5 10*3/uL (ref 4.0–10.5)
nRBC: 0 % (ref 0.0–0.2)

## 2019-03-07 MED ORDER — FOLIC ACID 1 MG PO TABS: 1.0000 mg | ORAL_TABLET | Freq: Every day | ORAL | 0 refills | Status: DC

## 2019-03-07 MED ORDER — SODIUM CHLORIDE 0.9 % IV SOLN
Freq: Once | INTRAVENOUS | Status: AC
  Administered 2019-03-07: 09:00:00 via INTRAVENOUS
  Filled 2019-03-07: qty 250

## 2019-03-07 MED ORDER — HEPARIN SOD (PORK) LOCK FLUSH 100 UNIT/ML IV SOLN
500.0000 [IU] | Freq: Once | INTRAVENOUS | Status: AC | PRN
  Administered 2019-03-07: 500 [IU]
  Filled 2019-03-07: qty 5

## 2019-03-07 MED ORDER — SODIUM CHLORIDE 0.9% FLUSH
10.0000 mL | Freq: Once | INTRAVENOUS | Status: AC
  Administered 2019-03-07: 10 mL
  Filled 2019-03-07: qty 10

## 2019-03-07 MED ORDER — PROCHLORPERAZINE MALEATE 10 MG PO TABS
ORAL_TABLET | ORAL | Status: AC
  Filled 2019-03-07: qty 1

## 2019-03-07 MED ORDER — SODIUM CHLORIDE 0.9 % IV SOLN
480.0000 mg/m2 | Freq: Once | INTRAVENOUS | Status: AC
  Administered 2019-03-07: 900 mg via INTRAVENOUS
  Filled 2019-03-07: qty 20

## 2019-03-07 MED ORDER — CYANOCOBALAMIN 1000 MCG/ML IJ SOLN
INTRAMUSCULAR | Status: AC
  Filled 2019-03-07: qty 1

## 2019-03-07 MED ORDER — DEXAMETHASONE 4 MG PO TABS: ORAL_TABLET | ORAL | 1 refills | Status: DC

## 2019-03-07 MED ORDER — SODIUM CHLORIDE 0.9% FLUSH
10.0000 mL | INTRAVENOUS | Status: DC | PRN
  Administered 2019-03-07: 10 mL
  Filled 2019-03-07: qty 10

## 2019-03-07 MED ORDER — PROCHLORPERAZINE MALEATE 10 MG PO TABS
10.0000 mg | ORAL_TABLET | Freq: Once | ORAL | Status: AC
  Administered 2019-03-07: 10 mg via ORAL

## 2019-03-07 MED ORDER — CYANOCOBALAMIN 1000 MCG/ML IJ SOLN
1000.0000 ug | Freq: Once | INTRAMUSCULAR | Status: AC
  Administered 2019-03-07: 1000 ug via INTRAMUSCULAR

## 2019-03-07 MED ORDER — BENZONATATE 200 MG PO CAPS: 200.0000 mg | ORAL_CAPSULE | Freq: Three times a day (TID) | ORAL | 0 refills | Status: DC | PRN

## 2019-03-07 NOTE — Progress Notes (Signed)
[place] Telephone:(336) [phone]   Fax:(336) [place], MD [place] [address] [place] [address]  DIAGNOSIS: Stage IV (T1b, N2, M1b) non-small cell lung cancer, adenocarcinoma diagnosed in March 2017 and presented with right lower lobe lung nodule in addition to mediastinal lymphadenopathy and metastatic bone lesions.  Molecular studies: PDL 1 TPS  <1%.   Foundation One Studies: Positive for ERBB2 [id number]. Negative for EGFR, KRAS, ALK, BRAF, MET, RET and ROS1.  PRIOR THERAPY:  1) Induction systemic chemotherapy with carboplatin for AUC of 5 and Alimta 500 MG/M2 every 3 weeks is status post 6 cycles at the [facility], last dose was given 03/05/2016 with stable disease. 2) palliative radiation to the metastatic bone disease in the lower back and pelvic area.  CURRENT THERAPY::  1)  Maintenance systemic chemotherapy with Alimta 500 MG/M2 every 3 weeks status post 51 cycles. First dose was given 03/25/2016.  2) Zometa 4 mg IV every 12 week for metastatic bone disease.  INTERVAL HIST Valerie Long 73 y.o. female returns to the clinic today for follow-up visit.  The patient is feeling fine today with no concerning complaints except for the low back pain.  She is currently undergoing physical therapy.  She denied having any chest pain, shortness of breath, cough or hemoptysis.  She denied having any fever or chills.  She has no nausea, vomiting, diarrhea or constipation.  She denied having any headache or visual changes.  She is here today for evaluation before starting cycle #52.  MEDICAL HISTORY: Past Medical History:  Diagnosis Date  . Adenocarcinoma of right lung, stage 4 ([place]) 2017  . Anemia   . Arthritis   . Bone metastases ([place]) 02/24/2017  . Encounter for antineoplastic chemotherapy 03/17/2016  . Hypercholesterolemia   . Hypertension 06/18/2016  . Osteopenia   . Pelvic kidney    Left. On CT  in [place]  . Pneumonia   . Shortness of breath dyspnea     ALLERGIES:  has No Known Allergies.  MEDICATIONS:  Current Outpatient Medications  Medication Sig Dispense Refill  . acetaminophen (TYLENOL) 500 MG tablet Take 1,000 mg by mouth every 6 (six) hours as needed for moderate pain or headache.    . baclofen (LIORESAL) 10 MG tablet Take 1 tablet (10 mg total) by mouth 3 (three) times daily. 30 each 0  . Calcium Carb-Cholecalciferol (CALCIUM 600/VITAMIN D3 PO) Take by mouth.    . Cyanocobalamin (B-12 PO) Take 2 tablets by mouth daily.    Marland Kitchen dexamethasone (DECADRON) 4 MG tablet Take 24m by mouth twice daily the day before, of, and after chemo 40 tablet 1  . Fish Oil-Cholecalciferol (OMEGA-3 + VITAMIN D3 PO) Take 1 tablet by mouth daily.    . folic acid (FOLVITE) 1 MG tablet TAKE 1 TABLET BY MOUTH EVERY DAY 90 tablet 0  . lidocaine-prilocaine (EMLA) cream Apply 1 application topically as needed. Apply 1-2 tsp over port site 1-2 hours prior to chemo. 30 g 0  . meloxicam (MOBIC) 15 MG tablet TAKE 1 TABLET BY MOUTH EVERY DAY AS NEEDED FOR PAIN 30 tablet 0  . ondansetron (ZOFRAN) 8 MG tablet Take 8 mg by mouth every 8 (eight) hours as needed.    .Marland KitchenOVER THE COUNTER MEDICATION Apply 1 drop topically daily as needed. 1 drop CBD oil in vicks vapor rub . Applied to right thigh for pain relief.    .Marland Kitchen  oxyCODONE-acetaminophen (PERCOCET/ROXICET) 5-325 MG tablet Take 1 tablet by mouth every 8 (eight) hours as needed for severe pain. 30 tablet 0  . PRESCRIPTION MEDICATION Inject 1 Dose as directed every 21 ( twenty-one) days. Chemo    . prochlorperazine (COMPAZINE) 10 MG tablet Take 1 tablet (10 mg total) by mouth every 6 (six) hours as needed for nausea or vomiting. 30 tablet 0   No current facility-administered medications for this visit.     SURGICAL HISTORY:  Past Surgical History:  Procedure Laterality Date  . CESAREAN SECTION     myomectomy  . COLONOSCOPY W/ POLYPECTOMY    . IR  GENERIC HISTORICAL  08/17/2016   IR US GUIDE VASC ACCESS RIGHT 08/17/2016 [name], MD [facility]  . IR GENERIC HISTORICAL  08/17/2016   IR FLUORO GUIDE PORT INSERTION RIGHT 08/17/2016 [name], MD [facility]  . MEDIASTINOSCOPY N/A 09/25/2015   Procedure: MEDIASTINOSCOPY;  Surgeon: Melrose Nakayama, MD;  Location: [facility];  Service: Thoracic;  Laterality: N/A;  . VIDEO BRONCHOSCOPY Bilateral 08/19/2015   Procedure: VIDEO BRONCHOSCOPY WITH FLUORO;  Surgeon: Rigoberto Noel, MD;  Location: [facility];  Service: Cardiopulmonary;  Laterality: Bilateral;  . VIDEO BRONCHOSCOPY N/A 09/08/2015   Procedure: VIDEO BRONCHOSCOPY WITH FLUORO;  Surgeon: Rigoberto Noel, MD;  Location: [facility];  Service: Thoracic;  Laterality: N/A;  . VIDEO BRONCHOSCOPY WITH ENDOBRONCHIAL ULTRASOUND Right 09/08/2015   Procedure: ATTEMPTED VIDEO BRONCHOSCOPY ENDOBRONCHIAL ULTRASOUND  ;  Surgeon: Rigoberto Noel, MD;  Location: [facility];  Service: Thoracic;  Laterality: Right;    REVIEW OF SYSTEMS:  A comprehensive review of systems was negative except for: Constitutional: positive for fatigue Musculoskeletal: positive for back pain   PHYSICAL EXAMINATION: General appearance: alert, cooperative, fatigued and no distress Head: Normocephalic, without obvious abnormality, atraumatic Neck: no adenopathy, no JVD, supple, symmetrical, trachea midline and thyroid not enlarged, symmetric, no tenderness/mass/nodules Lymph nodes: Cervical, supraclavicular, and axillary nodes normal. Resp: clear to auscultation bilaterally Back: symmetric, no curvature. ROM normal. No CVA tenderness. Cardio: regular rate and rhythm, S1, S2 normal, no murmur, click, rub or gallop GI: soft, non-tender; bowel sounds normal; no masses,  no organomegaly Extremities: extremities normal, atraumatic, no cyanosis or edema  ECOG PERFORMANCE STATUS: 1 - Symptomatic but completely ambulatory  Blood pressure 139/81, pulse 79, temperature 98 F (36.7 C),  temperature source Temporal, resp. rate 17, height _0  (1.6 m), weight 146 lb 3.2 oz (66.3 kg), last menstrual period 06/09/1998, SpO2 100 %.  LABORATORY DATA: Lab Results  Component Value Date   WBC 5.5 03/07/2019   HGB 10.5 (L) 03/07/2019   HCT 32.1 (L) 03/07/2019   MCV 102.9 (H) 03/07/2019   PLT 253 03/07/2019      Chemistry      Component Value Date/Time   NA 137 02/14/2019 0908   NA 137 06/09/2017 0901   K 4.0 02/14/2019 0908   K 3.9 06/09/2017 0901   CL 104 02/14/2019 0908   CO2 25 02/14/2019 0908   CO2 25 06/09/2017 0901   BUN 19 02/14/2019 0908   BUN 19.0 06/09/2017 0901   CREATININE 1.09 (H) 02/14/2019 0908   CREATININE 1.2 (H) 06/09/2017 0901   GLU 170 01/23/2016      Component Value Date/Time   CALCIUM 9.4 02/14/2019 0908   CALCIUM 9.9 06/09/2017 0901   ALKPHOS 62 02/14/2019 0908   ALKPHOS 63 06/09/2017 0901   AST 29 02/14/2019 0908   AST 35 (H) 06/09/2017 0901   ALT  13 02/14/2019 0908   ALT 23 06/09/2017 0901   BILITOT 0.4 02/14/2019 0908   BILITOT 0.65 06/09/2017 0901       RADIOGRAPHIC STUDIES: No results found.  ASSESSMENT AND PLAN:  This is a very pleasant 73 years old Hispanic female with metastatic non-small cell lung cancer, adenocarcinoma status post induction systemic chemotherapy with carboplatin and Alimta and she is currently on maintenance treatment with single agent Alimta status post 51 cycles. The patient continues to tolerate this treatment well with no concerning adverse effects. I recommended for her to proceed with cycle #52 today as planned. I will see her back for follow-up visit in 3 weeks for evaluation with repeat CT scan of the chest, abdomen pelvis for restaging of her disease. For the low back pain, she will continue her current treatment with gabapentin as well as Percocet on as-needed basis. I will give her a refill of benzoate, folic acid and Decadron today. The patient was advised to call immediately if she has any  concerning symptoms in the interval. The patient voices understanding of current disease status and treatment options and is in agreement with the current care plan. All questions were answered. The patient knows to call the clinic with any problems, questions or concerns. We can certainly see the patient much sooner if necessary.  Disclaimer: This note was dictated with voice recognition software. Similar sounding words can inadvertently be transcribed and may not be corrected upon review.

## 2019-03-07 NOTE — Telephone Encounter (Signed)
appts already scheduled. Gave contrast

## 2019-03-07 NOTE — Patient Instructions (Signed)
[facility] Discharge Instructions for Patients Receiving Chemotherapy  Today you received the following chemotherapy agents:  pemetrexed  To help prevent nausea and vomiting after your treatment, we encourage you to take your nausea medication as prescribed.   If you develop nausea and vomiting that is not controlled by your nausea medication, call the clinic.   BELOW ARE SYMPTOMS THAT SHOULD BE REPORTED IMMEDIATELY:  *FEVER GREATER THAN 100.5 F  *CHILLS WITH OR WITHOUT FEVER  NAUSEA AND VOMITING THAT IS NOT CONTROLLED WITH YOUR NAUSEA MEDICATION  *UNUSUAL SHORTNESS OF BREATH  *UNUSUAL BRUISING OR BLEEDING  TENDERNESS IN MOUTH AND THROAT WITH OR WITHOUT PRESENCE OF ULCERS  *URINARY PROBLEMS  *BOWEL PROBLEMS  UNUSUAL RASH Items with * indicate a potential emergency and should be followed up as soon as possible.  Feel free to call the clinic should you have any questions or concerns. The clinic phone number is [phone].  Please show the [facility] at check-in to the Emergency Department and triage nurse.

## 2019-03-14 DIAGNOSIS — M79605 Pain in left leg: Secondary | ICD-10-CM | POA: Diagnosis not present

## 2019-03-14 DIAGNOSIS — M545 Low back pain: Secondary | ICD-10-CM | POA: Diagnosis not present

## 2019-03-14 DIAGNOSIS — R202 Paresthesia of skin: Secondary | ICD-10-CM | POA: Diagnosis not present

## 2019-03-14 DIAGNOSIS — M6281 Muscle weakness (generalized): Secondary | ICD-10-CM | POA: Diagnosis not present

## 2019-03-20 DIAGNOSIS — M6281 Muscle weakness (generalized): Secondary | ICD-10-CM | POA: Diagnosis not present

## 2019-03-20 DIAGNOSIS — R202 Paresthesia of skin: Secondary | ICD-10-CM | POA: Diagnosis not present

## 2019-03-20 DIAGNOSIS — M545 Low back pain: Secondary | ICD-10-CM | POA: Diagnosis not present

## 2019-03-20 DIAGNOSIS — M79605 Pain in left leg: Secondary | ICD-10-CM | POA: Diagnosis not present

## 2019-03-22 DIAGNOSIS — M79605 Pain in left leg: Secondary | ICD-10-CM | POA: Diagnosis not present

## 2019-03-22 DIAGNOSIS — M6281 Muscle weakness (generalized): Secondary | ICD-10-CM | POA: Diagnosis not present

## 2019-03-22 DIAGNOSIS — R202 Paresthesia of skin: Secondary | ICD-10-CM | POA: Diagnosis not present

## 2019-03-22 DIAGNOSIS — M545 Low back pain: Secondary | ICD-10-CM | POA: Diagnosis not present

## 2019-03-27 ENCOUNTER — Ambulatory Visit (HOSPITAL_COMMUNITY)
Admission: RE | Admit: 2019-03-27 | Discharge: 2019-03-27 | Disposition: A | Discharge status: Home / Self Care | Payer: Medicare Other | Source: Ambulatory Visit | Attending: Internal Medicine | Admitting: Internal Medicine

## 2019-03-27 ENCOUNTER — Encounter (HOSPITAL_COMMUNITY): Payer: Self-pay

## 2019-03-27 ENCOUNTER — Other Ambulatory Visit: Payer: Self-pay | Admitting: *Deleted

## 2019-03-27 ENCOUNTER — Other Ambulatory Visit: Payer: Self-pay

## 2019-03-27 DIAGNOSIS — C3491 Malignant neoplasm of unspecified part of right bronchus or lung: Secondary | ICD-10-CM

## 2019-03-27 DIAGNOSIS — C3490 Malignant neoplasm of unspecified part of unspecified bronchus or lung: Secondary | ICD-10-CM

## 2019-03-27 MED ORDER — IOHEXOL 300 MG/ML  SOLN
100.0000 mL | Freq: Once | INTRAMUSCULAR | Status: AC | PRN
  Administered 2019-03-27: 100 mL via INTRAVENOUS

## 2019-03-27 MED ORDER — SODIUM CHLORIDE (PF) 0.9 % IJ SOLN
INTRAMUSCULAR | Status: AC
  Filled 2019-03-27: qty 50

## 2019-03-28 ENCOUNTER — Inpatient Hospital Stay (HOSPITAL_BASED_OUTPATIENT_CLINIC_OR_DEPARTMENT_OTHER): Payer: Medicare Other | Admitting: Physician Assistant

## 2019-03-28 ENCOUNTER — Inpatient Hospital Stay: Payer: Medicare Other

## 2019-03-28 ENCOUNTER — Inpatient Hospital Stay: Payer: Medicare Other | Attending: Internal Medicine

## 2019-03-28 ENCOUNTER — Encounter: Payer: Self-pay | Admitting: Physician Assistant

## 2019-03-28 ENCOUNTER — Other Ambulatory Visit: Payer: Self-pay

## 2019-03-28 VITALS — BP 145/84 | HR 75 | Temp 98.3°F | Resp 17 | Ht 63.0 in | Wt 149.2 lb

## 2019-03-28 DIAGNOSIS — C3491 Malignant neoplasm of unspecified part of right bronchus or lung: Secondary | ICD-10-CM

## 2019-03-28 DIAGNOSIS — Z791 Long term (current) use of non-steroidal anti-inflammatories (NSAID): Secondary | ICD-10-CM | POA: Insufficient documentation

## 2019-03-28 DIAGNOSIS — Z79899 Other long term (current) drug therapy: Secondary | ICD-10-CM | POA: Insufficient documentation

## 2019-03-28 DIAGNOSIS — M545 Low back pain: Secondary | ICD-10-CM | POA: Insufficient documentation

## 2019-03-28 DIAGNOSIS — I10 Essential (primary) hypertension: Secondary | ICD-10-CM | POA: Diagnosis not present

## 2019-03-28 DIAGNOSIS — Z5111 Encounter for antineoplastic chemotherapy: Secondary | ICD-10-CM | POA: Insufficient documentation

## 2019-03-28 DIAGNOSIS — Z95828 Presence of other vascular implants and grafts: Secondary | ICD-10-CM

## 2019-03-28 DIAGNOSIS — Z923 Personal history of irradiation: Secondary | ICD-10-CM | POA: Diagnosis not present

## 2019-03-28 DIAGNOSIS — M8580 Other specified disorders of bone density and structure, unspecified site: Secondary | ICD-10-CM | POA: Diagnosis not present

## 2019-03-28 DIAGNOSIS — R05 Cough: Secondary | ICD-10-CM | POA: Diagnosis not present

## 2019-03-28 DIAGNOSIS — C3431 Malignant neoplasm of lower lobe, right bronchus or lung: Secondary | ICD-10-CM | POA: Insufficient documentation

## 2019-03-28 DIAGNOSIS — E7800 Pure hypercholesterolemia, unspecified: Secondary | ICD-10-CM | POA: Diagnosis not present

## 2019-03-28 DIAGNOSIS — C7951 Secondary malignant neoplasm of bone: Secondary | ICD-10-CM | POA: Insufficient documentation

## 2019-03-28 LAB — CMP (CANCER CENTER ONLY)
ALT: 13 U/L (ref 0–44)
AST: 28 U/L (ref 15–41)
Albumin: 3.4 g/dL — ABNORMAL LOW (ref 3.5–5.0)
Alkaline Phosphatase: 64 U/L (ref 38–126)
Anion gap: 12 (ref 5–15)
BUN: 23 mg/dL (ref 8–23)
CO2: 23 mmol/L (ref 22–32)
Calcium: 9.4 mg/dL (ref 8.9–10.3)
Chloride: 104 mmol/L (ref 98–111)
Creatinine: 1.19 mg/dL — ABNORMAL HIGH (ref 0.44–1.00)
GFR, Est AFR Am: 53 mL/min — ABNORMAL LOW (ref >60)
GFR, Estimated: 46 mL/min — ABNORMAL LOW (ref >60)
Glucose, Bld: 111 mg/dL — ABNORMAL HIGH (ref 70–99)
Potassium: 4.1 mmol/L (ref 3.5–5.1)
Sodium: 139 mmol/L (ref 135–145)
Total Bilirubin: 0.6 mg/dL (ref 0.3–1.2)
Total Protein: 6.9 g/dL (ref 6.5–8.1)

## 2019-03-28 LAB — CBC WITH DIFFERENTIAL (CANCER CENTER ONLY)
Abs Immature Granulocytes: 0.02 10*3/uL (ref 0.00–0.07)
Basophils Absolute: 0 10*3/uL (ref 0.0–0.1)
Basophils Relative: 0 %
Eosinophils Absolute: 0 10*3/uL (ref 0.0–0.5)
Eosinophils Relative: 0 %
HCT: 31.9 % — ABNORMAL LOW (ref 36.0–46.0)
Hemoglobin: 10.5 g/dL — ABNORMAL LOW (ref 12.0–15.0)
Immature Granulocytes: 0 %
Lymphocytes Relative: 14 %
Lymphs Abs: 0.6 10*3/uL — ABNORMAL LOW (ref 0.7–4.0)
MCH: 34.1 pg — ABNORMAL HIGH (ref 26.0–34.0)
MCHC: 32.9 g/dL (ref 30.0–36.0)
MCV: 103.6 fL — ABNORMAL HIGH (ref 80.0–100.0)
Monocytes Absolute: 0.5 10*3/uL (ref 0.1–1.0)
Monocytes Relative: 10 %
Neutro Abs: 3.5 10*3/uL (ref 1.7–7.7)
Neutrophils Relative %: 76 %
Platelet Count: 257 10*3/uL (ref 150–400)
RBC: 3.08 MIL/uL — ABNORMAL LOW (ref 3.87–5.11)
RDW: 14.1 % (ref 11.5–15.5)
WBC Count: 4.7 10*3/uL (ref 4.0–10.5)
nRBC: 0 % (ref 0.0–0.2)

## 2019-03-28 MED ORDER — PROCHLORPERAZINE MALEATE 10 MG PO TABS
10.0000 mg | ORAL_TABLET | Freq: Once | ORAL | Status: AC
  Administered 2019-03-28: 10 mg via ORAL

## 2019-03-28 MED ORDER — HEPARIN SOD (PORK) LOCK FLUSH 100 UNIT/ML IV SOLN
500.0000 [IU] | Freq: Once | INTRAVENOUS | Status: AC | PRN
  Administered 2019-03-28: 500 [IU]
  Filled 2019-03-28: qty 5

## 2019-03-28 MED ORDER — BENZONATATE 200 MG PO CAPS: 200.0000 mg | ORAL_CAPSULE | Freq: Three times a day (TID) | ORAL | 0 refills | Status: DC | PRN

## 2019-03-28 MED ORDER — PROCHLORPERAZINE MALEATE 10 MG PO TABS
ORAL_TABLET | ORAL | Status: AC
  Filled 2019-03-28: qty 1

## 2019-03-28 MED ORDER — SODIUM CHLORIDE 0.9% FLUSH
10.0000 mL | INTRAVENOUS | Status: DC | PRN
  Administered 2019-03-28: 10 mL
  Filled 2019-03-28: qty 10

## 2019-03-28 MED ORDER — SODIUM CHLORIDE 0.9% FLUSH
10.0000 mL | Freq: Once | INTRAVENOUS | Status: AC
  Administered 2019-03-28: 10 mL
  Filled 2019-03-28: qty 10

## 2019-03-28 MED ORDER — SODIUM CHLORIDE 0.9 % IV SOLN
900.0000 mg | Freq: Once | INTRAVENOUS | Status: AC
  Administered 2019-03-28: 900 mg via INTRAVENOUS
  Filled 2019-03-28: qty 20

## 2019-03-28 MED ORDER — SODIUM CHLORIDE 0.9 % IV SOLN
Freq: Once | INTRAVENOUS | Status: AC
  Administered 2019-03-28: 10:00:00 via INTRAVENOUS
  Filled 2019-03-28: qty 250

## 2019-03-28 NOTE — Patient Instructions (Signed)
[facility] Discharge Instructions for Patients Receiving Chemotherapy  Today you received the following chemotherapy agents: Pemetrexed (Alimta)  To help prevent nausea and vomiting after your treatment, we encourage you to take your nausea medication as directed.    If you develop nausea and vomiting that is not controlled by your nausea medication, call the clinic.   BELOW ARE SYMPTOMS THAT SHOULD BE REPORTED IMMEDIATELY:  *FEVER GREATER THAN 100.5 F  *CHILLS WITH OR WITHOUT FEVER  NAUSEA AND VOMITING THAT IS NOT CONTROLLED WITH YOUR NAUSEA MEDICATION  *UNUSUAL SHORTNESS OF BREATH  *UNUSUAL BRUISING OR BLEEDING  TENDERNESS IN MOUTH AND THROAT WITH OR WITHOUT PRESENCE OF ULCERS  *URINARY PROBLEMS  *BOWEL PROBLEMS  UNUSUAL RASH Items with * indicate a potential emergency and should be followed up as soon as possible.  Feel free to call the clinic should you have any questions or concerns. The clinic phone number is [phone].  Please show the [facility] at check-in to the Emergency Department and triage nurse.  Coronavirus (COVID-19) Are you at risk?  Are you at risk for the Coronavirus (COVID-19)?  To be considered HIGH RISK for Coronavirus (COVID-19), you have to meet the following criteria:  . Traveled to [place], [place], [place], [place] or [place]; or in the [place] to [place], [place], [place], or [place]; and have fever, cough, and shortness of breath within the last 2 weeks of travel OR . Been in close contact with a person diagnosed with COVID-19 within the last 2 weeks and have fever, cough, and shortness of breath . IF YOU DO NOT MEET THESE CRITERIA, YOU ARE CONSIDERED LOW RISK FOR COVID-19.  What to do if you are HIGH RISK for COVID-19?  Marland Kitchen If you are having a medical emergency, call 911. . Seek medical care right away. Before you go to a doctor's office, urgent care or emergency department, call ahead and tell them  about your recent travel, contact with someone diagnosed with COVID-19, and your symptoms. You should receive instructions from your physician's office regarding next steps of care.  . When you arrive at healthcare provider, tell the healthcare staff immediately you have returned from visiting [place], [place], [place], [place] or [place]; or traveled in the [place] to [place], [place], [place], or [place]; in the last two weeks or you have been in close contact with a person diagnosed with COVID-19 in the last 2 weeks.   . Tell the health care staff about your symptoms: fever, cough and shortness of breath. . After you have been seen by a medical provider, you will be either: o Tested for (COVID-19) and discharged home on quarantine except to seek medical care if symptoms worsen, and asked to  - Stay home and avoid contact with others until you get your results (4-5 days)  - Avoid travel on public transportation if possible (such as bus, train, or airplane) or o Sent to the Emergency Department by EMS for evaluation, COVID-19 testing, and possible admission depending on your condition and test results.  What to do if you are LOW RISK for COVID-19?  Reduce your risk of any infection by using the same precautions used for avoiding the common cold or flu:  Marland Kitchen Wash your hands often with soap and warm water for at least 20 seconds.  If soap and water are not readily available, use an alcohol-based hand sanitizer with at least 60% alcohol.  . If coughing  or sneezing, cover your mouth and nose by coughing or sneezing into the elbow areas of your shirt or coat, into a tissue or into your sleeve (not your hands). . Avoid shaking hands with others and consider head nods or verbal greetings only. . Avoid touching your eyes, nose, or mouth with unwashed hands.  . Avoid close contact with people who are sick. . Avoid places or events with large numbers of people in one location, like concerts or  sporting events. . Carefully consider travel plans you have or are making. . If you are planning any travel outside or inside the [place], visit the CDC's Travelers' Health webpage for the latest health notices. . If you have some symptoms but not all symptoms, continue to monitor at home and seek medical attention if your symptoms worsen. . If you are having a medical emergency, call 911.   [place] / e-Visit: [email or web address]         [facility]: [facility]: [phone]                   [facility]: [phone]

## 2019-03-28 NOTE — Progress Notes (Signed)
[place], [address] [place] [address]  DIAGNOSIS: Stage IV (T1b, N2, M1b) non-small cell lung cancer, adenocarcinoma diagnosed in March 2017 and presented with right lower lobe lung nodule in addition to mediastinal lymphadenopathy and metastatic bone lesions.  Molecular studies:PDL 1 TPS <1%.   [facility] Studies: Positive for ERBB2 [id number]. Negative for EGFR, KRAS, ALK, BRAF, MET, RET and ROS1.  PRIOR THERAPY: 1) Induction systemic chemotherapy with carboplatin for AUC of 5 and Alimta 500 MG/M2 every 3 weeks is status post 6 cycles at the [facility], last dose was given 03/05/2016 with stable disease. 2) palliative radiation to the metastatic bone disease in the lower back and pelvic area.  CURRENT THERAPY: 1) Maintenance systemic chemotherapy with Alimta 500 MG/M2 every 3 weeks status post 51cycles. First dose was given 03/25/2016.  2) Zometa 4 mg IV every 12 week for metastatic bone disease.  INTERVAL HISTORY: Valerie Long 73 y.o. female returns to the clinic for a follow up visit. The patient is feeling well today without any concerning complaints except for a persistent cough at night time. She has been using tessalon for her cough with resolution of her symptoms. She denies any fever, nasal congestion, chills, sore throats, myalgias, chest pain, or shortness of breath. The patient continues to tolerate treatment with systemic chemotherapy with Alimta well without any adverse side effects. Denies any night sweats or weight loss. Denies any hemoptysis. Denies any nausea, vomiting, diarrhea, or constipation. Denies any headache or visual changes. She still continues to have low back pain secondary to her multilevel degenerative changes in her spine. She is undergoing physical therapy. She is followed by Dr. Posey Pronto from neurology. The patient recently had a restaging CT scan performed.  She is here today for evaluation and to review her scan results before starting cycle #52.   MEDICAL HISTORY: Past Medical History:  Diagnosis Date  . Adenocarcinoma of right lung, stage 4 ([place]) 2017  . Anemia   . Arthritis   . Bone metastases ([place]) 02/24/2017  . Encounter for antineoplastic chemotherapy 03/17/2016  . Hypercholesterolemia   . Hypertension 06/18/2016  . Osteopenia   . 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 Pneumonia   . Shortness of breath dyspnea     ALLERGIES:  has No Known Allergies.  MEDICATIONS:  Current Outpatient Medications  Medication Sig Dispense Refill  . acetaminophen (TYLENOL) 500 MG tablet Take 1,000 mg by mouth every 6 (six) hours as needed for moderate pain or headache.    . baclofen (LIORESAL) 10 MG tablet Take 1 tablet (10 mg total) by mouth 3 (three) times daily. 30 each 0  . benzonatate (TESSALON) 200 MG capsule Take 1 capsule (200 mg total) by mouth 3 (three) times daily as needed for cough. 20 capsule 0  . Calcium Carb-Cholecalciferol (CALCIUM 600/VITAMIN D3 PO) Take by mouth.    . Cyanocobalamin (B-12 PO) Take 2 tablets by mouth daily.    Marland Kitchen dexamethasone (DECADRON) 4 MG tablet Take 33m by mouth twice daily the day before, of, and after chemo 40 tablet 1  . Fish Oil-Cholecalciferol (OMEGA-3 + VITAMIN D3 PO) Take 1 tablet by mouth daily.    . folic acid (FOLVITE) 1 MG tablet Take 1 tablet (1 mg total) by mouth daily. 90 tablet 0  . lidocaine-prilocaine (EMLA) cream Apply 1 application topically as needed. Apply 1-2 tsp over port  site 1-2 hours prior to chemo. 30 g 0  . meloxicam (MOBIC) 15 MG tablet TAKE 1 TABLET BY MOUTH EVERY DAY AS NEEDED FOR PAIN 30 tablet 0  . ondansetron (ZOFRAN) 8 MG tablet Take 8 mg by mouth every 8 (eight) hours as needed.    Marland Kitchen OVER THE COUNTER MEDICATION Apply 1 drop topically daily as needed. 1 drop CBD oil in vicks vapor rub . Applied to right thigh for pain relief.    Marland Kitchen oxyCODONE-acetaminophen  (PERCOCET/ROXICET) 5-325 MG tablet Take 1 tablet by mouth every 8 (eight) hours as needed for severe pain. 30 tablet 0  . PRESCRIPTION MEDICATION Inject 1 Dose as directed every 21 ( twenty-one) days. Chemo    . prochlorperazine (COMPAZINE) 10 MG tablet Take 1 tablet (10 mg total) by mouth every 6 (six) hours as needed for nausea or vomiting. 30 tablet 0   No current facility-administered medications for this visit.     SURGICAL HISTORY:  Past Surgical History:  Procedure Laterality Date  . CESAREAN SECTION     myomectomy  . COLONOSCOPY W/ POLYPECTOMY    . IR GENERIC HISTORICAL  08/17/2016   IR US GUIDE VASC ACCESS RIGHT 08/17/2016 [name], MD [facility]  . IR GENERIC HISTORICAL  08/17/2016   IR FLUORO GUIDE PORT INSERTION RIGHT 08/17/2016 [name], MD [facility]  . MEDIASTINOSCOPY N/A 09/25/2015   Procedure: MEDIASTINOSCOPY;  Surgeon: Melrose Nakayama, MD;  Location: [facility];  Service: Thoracic;  Laterality: N/A;  . VIDEO BRONCHOSCOPY Bilateral 08/19/2015   Procedure: VIDEO BRONCHOSCOPY WITH FLUORO;  Surgeon: Rigoberto Noel, MD;  Location: [facility];  Service: Cardiopulmonary;  Laterality: Bilateral;  . VIDEO BRONCHOSCOPY N/A 09/08/2015   Procedure: VIDEO BRONCHOSCOPY WITH FLUORO;  Surgeon: Rigoberto Noel, MD;  Location: [facility];  Service: Thoracic;  Laterality: N/A;  . VIDEO BRONCHOSCOPY WITH ENDOBRONCHIAL ULTRASOUND Right 09/08/2015   Procedure: ATTEMPTED VIDEO BRONCHOSCOPY ENDOBRONCHIAL ULTRASOUND  ;  Surgeon: Rigoberto Noel, MD;  Location: [facility];  Service: Thoracic;  Laterality: Right;    REVIEW OF SYSTEMS:   Review of Systems  Constitutional: Negative for appetite change, chills, fatigue, fever and unexpected weight change.  HENT: Negative for mouth sores, nosebleeds, sore throat and trouble swallowing.   Eyes: Negative for eye problems and icterus.  Respiratory: Positive for cough at night time. Negative for hemoptysis, shortness of breath and wheezing.    Cardiovascular: Positive for bilateral lower leg swelling. Negative for chest pain.  Gastrointestinal: Negative for abdominal pain, constipation, diarrhea, nausea and vomiting.  Genitourinary: Negative for bladder incontinence, difficulty urinating, dysuria, frequency and hematuria.   Musculoskeletal: Positive for chronic back pain and neck/shoulder pain 4 days following chemotherapy. Negative for gait problem, and neck stiffness.  Skin: Negative for itching and rash.  Neurological: Negative for dizziness, extremity weakness, gait problem, headaches, light-headedness and seizures.  Hematological: Negative for adenopathy. Does not bruise/bleed easily.  Psychiatric/Behavioral: Negative for confusion, depression and sleep disturbance. The patient is not nervous/anxious. nervous/anxious.     PHYSICAL EXAMINATION:  Last menstrual period 06/09/1998.  ECOG PERFORMANCE STATUS: 1 - Symptomatic but completely ambulatory  Physical Exam  Constitutional: Oriented to person, place, and time and well-developed, well-nourished, and in no distress.  HENT:  Head: Normocephalic and atraumatic.  Mouth/Throat: Oropharynx is clear and moist. No oropharyngeal exudate.  Eyes: Conjunctivae are normal. Right eye exhibits no discharge. Left eye exhibits no discharge. No scleral icterus.  Neck: Normal range of motion. Neck supple.  Cardiovascular: Normal rate,  regular rhythm, normal heart sounds and intact distal pulses.   Pulmonary/Chest: Effort normal and breath sounds normal. No respiratory distress. No wheezes. No rales.  Abdominal: Soft. Bowel sounds are normal. Exhibits no distension and no mass. There is no tenderness.  Musculoskeletal: Bilateral lower extremity swelling. Normal range of motion.  Lymphadenopathy:    No cervical adenopathy.  Neurological: Alert and oriented to person, place, and time. Exhibits normal muscle tone. Gait normal. Coordination normal.  Skin: Skin is warm and dry. No rash noted.  Not diaphoretic. No erythema. No pallor.  Psychiatric: Mood, memory and judgment normal.  Vitals reviewed.  LABORATORY DATA: Lab Results  Component Value Date   WBC 5.5 03/07/2019   HGB 10.5 (L) 03/07/2019   HCT 32.1 (L) 03/07/2019   MCV 102.9 (H) 03/07/2019   PLT 253 03/07/2019      Chemistry      Component Value Date/Time   NA 137 03/07/2019 0821   NA 137 06/09/2017 0901   K 3.9 03/07/2019 0821   K 3.9 06/09/2017 0901   CL 104 03/07/2019 0821   CO2 23 03/07/2019 0821   CO2 25 06/09/2017 0901   BUN 19 03/07/2019 0821   BUN 19.0 06/09/2017 0901   CREATININE 1.07 (H) 03/07/2019 0821   CREATININE 1.2 (H) 06/09/2017 0901   GLU 170 01/23/2016      Component Value Date/Time   CALCIUM 9.0 03/07/2019 0821   CALCIUM 9.9 06/09/2017 0901   ALKPHOS 62 03/07/2019 0821   ALKPHOS 63 06/09/2017 0901   AST 28 03/07/2019 0821   AST 35 (H) 06/09/2017 0901   ALT 14 03/07/2019 0821   ALT 23 06/09/2017 0901   BILITOT 0.5 03/07/2019 0821   BILITOT 0.65 06/09/2017 0901       RADIOGRAPHIC STUDIES:  Ct Chest W Contrast  Result Date: 03/27/2019 CLINICAL DATA:  Non-small-cell lung cancer diagnosed 3/17. Lymph node and bone metastasis. Chemotherapy ongoing. Radiation therapy in 2018. Low back and left leg pain. Shortness of breath. Cough for 2 weeks. EXAM: CT CHEST, ABDOMEN, AND PELVIS WITH CONTRAST TECHNIQUE: Multidetector CT imaging of the chest, abdomen and pelvis was performed following the standard protocol during bolus administration of intravenous contrast. CONTRAST:  174m OMNIPAQUE IOHEXOL 300 MG/ML  SOLN COMPARISON:  12/11/2018 FINDINGS: CT CHEST FINDINGS Cardiovascular: Right Port-A-Cath tip at superior caval/atrial junction. Normal heart size, without pericardial effusion. Aortic atherosclerosis. No central pulmonary embolism, on this non-dedicated study. Mediastinum/Nodes: No supraclavicular adenopathy. No mediastinal or hilar adenopathy. Lungs/Pleura: No pleural fluid. Bilateral  pulmonary nodules. Index central right upper lobe pulmonary nodule measures 1.2 cm on 40/4 and is unchanged. A superior segment right lower lobe pulmonary nodule on the same image measures 1.0 cm today versus similar (when remeasured). Anterior right lower lobe lung mass measures 3.5 x 2.5 cm on 77/4 versus 3.7 x 2.9 cm on the prior exam (when remeasured). Lingular nodule measures 1.0 cm on 67/4 and is similar (when remeasured). A left lower lobe pulmonary nodule measures 1.3 cm on 80/4 versus 1.1 cm on the prior exam (when remeasured). Musculoskeletal: Multifocal sclerotic osseous metastasis, similar severity and distribution. CT ABDOMEN PELVIS FINDINGS Hepatobiliary: Normal liver. Multiple small gallstones without acute cholecystitis or biliary duct dilatation. Pancreas: Normal, without mass or ductal dilatation. Spleen: Normal in size, without focal abnormality. Adrenals/Urinary Tract: Normal adrenal glands. Normal right kidney. Left pelvic kidney with malrotation. Normal urinary bladder. Stomach/Bowel: Normal stomach, without wall thickening. There is small bowel nonrotation, with colon positioned in the left side of  the abdomen and small bowel positioned in the right-side of the abdomen. Colonic stool burden suggests constipation. The appendix is positioned in the central upper abdomen, as is the terminal ileum. Otherwise normal. Vascular/Lymphatic: Aortic and branch vessel atherosclerosis. No abdominopelvic adenopathy. Reproductive: Normal uterus and adnexa. Other: No significant free fluid. No evidence of omental or peritoneal disease. Tiny periumbilical fat containing ventral wall hernia. Musculoskeletal: Multifocal sclerotic osseous metastasis are similar in size and distribution. S shaped thoracolumbar spine curvature. IMPRESSION: 1. Similar disease burden within the lungs. Although the right lower lobe lung mass measures slightly smaller, the majority of lung nodules are similar and a left lower lobe lung  nodule has slightly enlarged. 2. No thoracic adenopathy. 3. No soft tissue metastasis within the abdomen or pelvis. 4. Similar sclerotic osseous metastasis. 5.  Aortic Atherosclerosis (ICD10-I70.0). 6. Small bowel nonrotation. No acute complication. Possible constipation. 7. Left pelvic kidney. 8. Cholelithiasis. Electronically Signed   By: Abigail Miyamoto M.D.   On: 03/27/2019 15:04   Ct Abdomen Pelvis W Contrast  Result Date: 03/27/2019 CLINICAL DATA:  Non-small-cell lung cancer diagnosed 3/17. Lymph node and bone metastasis. Chemotherapy ongoing. Radiation therapy in 2018. Low back and left leg pain. Shortness of breath. Cough for 2 weeks. EXAM: CT CHEST, ABDOMEN, AND PELVIS WITH CONTRAST TECHNIQUE: Multidetector CT imaging of the chest, abdomen and pelvis was performed following the standard protocol during bolus administration of intravenous contrast. CONTRAST:  135m OMNIPAQUE IOHEXOL 300 MG/ML  SOLN COMPARISON:  12/11/2018 FINDINGS: CT CHEST FINDINGS Cardiovascular: Right Port-A-Cath tip at superior caval/atrial junction. Normal heart size, without pericardial effusion. Aortic atherosclerosis. No central pulmonary embolism, on this non-dedicated study. Mediastinum/Nodes: No supraclavicular adenopathy. No mediastinal or hilar adenopathy. Lungs/Pleura: No pleural fluid. Bilateral pulmonary nodules. Index central right upper lobe pulmonary nodule measures 1.2 cm on 40/4 and is unchanged. A superior segment right lower lobe pulmonary nodule on the same image measures 1.0 cm today versus similar (when remeasured). Anterior right lower lobe lung mass measures 3.5 x 2.5 cm on 77/4 versus 3.7 x 2.9 cm on the prior exam (when remeasured). Lingular nodule measures 1.0 cm on 67/4 and is similar (when remeasured). A left lower lobe pulmonary nodule measures 1.3 cm on 80/4 versus 1.1 cm on the prior exam (when remeasured). Musculoskeletal: Multifocal sclerotic osseous metastasis, similar severity and distribution. CT  ABDOMEN PELVIS FINDINGS Hepatobiliary: Normal liver. Multiple small gallstones without acute cholecystitis or biliary duct dilatation. Pancreas: Normal, without mass or ductal dilatation. Spleen: Normal in size, without focal abnormality. Adrenals/Urinary Tract: Normal adrenal glands. Normal right kidney. Left pelvic kidney with malrotation. Normal urinary bladder. Stomach/Bowel: Normal stomach, without wall thickening. There is small bowel nonrotation, with colon positioned in the left side of the abdomen and small bowel positioned in the right-side of the abdomen. Colonic stool burden suggests constipation. The appendix is positioned in the central upper abdomen, as is the terminal ileum. Otherwise normal. Vascular/Lymphatic: Aortic and branch vessel atherosclerosis. No abdominopelvic adenopathy. Reproductive: Normal uterus and adnexa. Other: No significant free fluid. No evidence of omental or peritoneal disease. Tiny periumbilical fat containing ventral wall hernia. Musculoskeletal: Multifocal sclerotic osseous metastasis are similar in size and distribution. S shaped thoracolumbar spine curvature. IMPRESSION: 1. Similar disease burden within the lungs. Although the right lower lobe lung mass measures slightly smaller, the majority of lung nodules are similar and a left lower lobe lung nodule has slightly enlarged. 2. No thoracic adenopathy. 3. No soft tissue metastasis within the abdomen or pelvis. 4. Similar  sclerotic osseous metastasis. 5.  Aortic Atherosclerosis (ICD10-I70.0). 6. Small bowel nonrotation. No acute complication. Possible constipation. 7. Left pelvic kidney. 8. Cholelithiasis. Electronically Signed   By: Abigail Miyamoto M.D.   On: 03/27/2019 15:04     ASSESSMENT/PLAN:  This is a very pleasant 73 year old Hispanic female diagnosed with metastatic non-small cell lung cancer, adenocarcinoma. She presented with a right lower lobe lung nodule in addition to mediastinal lymphadenopathy and  metastatic bone lesions. She was diagnosed in March 2017. Her PDL 1 expression is <1% and she has no actionable mutations.  She previously underwent inductiontreatmentwith systemic chemotherapy with Carboplatin and Alimta. He is currently undergoing maintenance therapy withAlimta. She is status post 51cycles. The patient has been tolerating this treatment fairly well without any concerning adverse effects.  The patient recently had a restaging CT scan. Dr. Julien Nordmann personally and independently reviewed the scan and discussed the results with the patient today. The scan showed no evidence of disease progression. Dr. Julien Nordmann recommends that she proceed with cycle #52 today as scheduled.   We will see her back for a follow up visit in 3 weeks for evaluation before starting cycle #53.  The patient will continue to take her folic acid daily as well as her decadron pre-medication  She will continue to get her Zometa every 12 weeks. She last received this on 01/24/2019. She is scheduled to receive Zometa at her next appointment.   I sent a refill of the patient's tessalon to her pharmacy for her cough.  The patient was advised to call immediately if she has any concerning symptoms in the interval. The patient voices understanding of current disease status and treatment options and is in agreement with the current care plan. All questions were answered. The patient knows to call the clinic with any problems, questions or concerns. We can certainly see the patient much sooner if necessary   No orders of the defined types were placed in this encounter.    Valerie Medlen L Kenly Xiao, PA-C 03/28/19  ADDENDUM: Hematology/Oncology Attending: I had a face-to-face encounter with the patient today.  I recommended her care plan.  This is a very pleasant 73 years old African-American female with metastatic non-small cell lung cancer, adenocarcinoma status post 6 cycles of systemic chemotherapy with  carboplatin and Alimta.  She is currently undergoing maintenance treatment with Alimta status post 51 cycles and has been tolerating this treatment well with no concerning adverse effects. The patient had repeat CT scan of the chest, abdomen pelvis performed recently.  I personally and independently reviewed the scans and discussed the results with the patient today. Her scan showed no concerning findings for disease progression. I recommended for the patient to continue her current treatment with Alimta and she will proceed with cycle #52 today. For the low back pain she will continue with her current pain medication. The patient will come back for follow-up visit in 3 weeks for evaluation before the next cycle of her treatment. She was advised to call immediately if she has any concerning symptoms in the interval.  Disclaimer: This note was dictated with voice recognition software. Similar sounding words can inadvertently be transcribed and may be missed upon review. Eilleen Kempf, MD 03/28/19

## 2019-03-29 ENCOUNTER — Telehealth: Payer: Self-pay | Admitting: Internal Medicine

## 2019-03-29 NOTE — Telephone Encounter (Signed)
Per 10/21 los appt already scheduled.

## 2019-04-04 DIAGNOSIS — M545 Low back pain: Secondary | ICD-10-CM | POA: Diagnosis not present

## 2019-04-04 DIAGNOSIS — R202 Paresthesia of skin: Secondary | ICD-10-CM | POA: Diagnosis not present

## 2019-04-04 DIAGNOSIS — M6281 Muscle weakness (generalized): Secondary | ICD-10-CM | POA: Diagnosis not present

## 2019-04-04 DIAGNOSIS — M79605 Pain in left leg: Secondary | ICD-10-CM | POA: Diagnosis not present

## 2019-04-05 ENCOUNTER — Other Ambulatory Visit: Payer: Self-pay

## 2019-04-06 ENCOUNTER — Other Ambulatory Visit: Payer: Self-pay

## 2019-04-09 ENCOUNTER — Ambulatory Visit (INDEPENDENT_AMBULATORY_CARE_PROVIDER_SITE_OTHER): Payer: Medicare Other | Admitting: Internal Medicine

## 2019-04-09 ENCOUNTER — Encounter: Payer: Self-pay | Admitting: Internal Medicine

## 2019-04-09 ENCOUNTER — Other Ambulatory Visit: Payer: Self-pay

## 2019-04-09 VITALS — BP 144/82 | HR 88 | Temp 97.7°F | Resp 16 | Ht 63.0 in | Wt 149.4 lb

## 2019-04-09 DIAGNOSIS — M545 Low back pain, unspecified: Secondary | ICD-10-CM

## 2019-04-09 DIAGNOSIS — E7800 Pure hypercholesterolemia, unspecified: Secondary | ICD-10-CM | POA: Diagnosis not present

## 2019-04-09 DIAGNOSIS — M8580 Other specified disorders of bone density and structure, unspecified site: Secondary | ICD-10-CM | POA: Diagnosis not present

## 2019-04-09 DIAGNOSIS — G8929 Other chronic pain: Secondary | ICD-10-CM

## 2019-04-09 DIAGNOSIS — M5416 Radiculopathy, lumbar region: Secondary | ICD-10-CM

## 2019-04-09 DIAGNOSIS — Z0000 Encounter for general adult medical examination without abnormal findings: Secondary | ICD-10-CM

## 2019-04-09 MED ORDER — TETANUS-DIPHTH-ACELL PERTUSSIS 5-2.5-18.5 LF-MCG/0.5 IM SUSP: 0.5000 mL | Freq: Once | INTRAMUSCULAR | 0 refills | Status: AC

## 2019-04-09 MED ORDER — PREGABALIN 75 MG PO CAPS: ORAL_CAPSULE | ORAL | 3 refills | Status: DC

## 2019-04-09 NOTE — Patient Instructions (Addendum)
Please schedule Medicare Wellness with Glenard Haring.   GO TO THE LAB : Get the blood work     GO TO THE FRONT DESK Schedule your next appointment   for a checkup in 6 months

## 2019-04-09 NOTE — Progress Notes (Signed)
Subjective:    Patient ID: Valerie Long, female    DOB: 1945-11-21, 73 y.o.   MRN: [id number]  DOS:  04/09/2019 Type of visit - description: Routine office visit Hyperlipidemia, diet controlled, patient likes her cholesterol check Back pain: About the same, saw neurology, note and MRI results reviewed. History of colon polyp, chart reviewed and discussed with the patient. History of lung cancer: Last note from oncology reviewed   Review of Systems Denies fever chills No chest pain or difficulty breathing No nausea, vomiting, diarrhea.  No blood in the stools Emotionally doing well. Past Medical History:  Diagnosis Date  . Adenocarcinoma of right lung, stage 4 ([place]) 2017  . Anemia   . Arthritis   . Bone metastases ([place]) 02/24/2017  . Encounter for antineoplastic chemotherapy 03/17/2016  . Hypercholesterolemia   . Hypertension 06/18/2016  . Osteopenia   . 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 Pneumonia   . Shortness of breath dyspnea     Past Surgical History:  Procedure Laterality Date  . CESAREAN SECTION     myomectomy  . COLONOSCOPY W/ POLYPECTOMY    . IR GENERIC HISTORICAL  08/17/2016   IR US GUIDE VASC ACCESS RIGHT 08/17/2016 [name], MD [facility]  . IR GENERIC HISTORICAL  08/17/2016   IR FLUORO GUIDE PORT INSERTION RIGHT 08/17/2016 [name], MD [facility]  . MEDIASTINOSCOPY N/A 09/25/2015   Procedure: MEDIASTINOSCOPY;  Surgeon: Melrose Nakayama, MD;  Location: [facility];  Service: Thoracic;  Laterality: N/A;  . VIDEO BRONCHOSCOPY Bilateral 08/19/2015   Procedure: VIDEO BRONCHOSCOPY WITH FLUORO;  Surgeon: Rigoberto Noel, MD;  Location: [facility];  Service: Cardiopulmonary;  Laterality: Bilateral;  . VIDEO BRONCHOSCOPY N/A 09/08/2015   Procedure: VIDEO BRONCHOSCOPY WITH FLUORO;  Surgeon: Rigoberto Noel, MD;  Location: [facility];  Service: Thoracic;  Laterality: N/A;  . VIDEO BRONCHOSCOPY WITH ENDOBRONCHIAL ULTRASOUND Right 09/08/2015   Procedure: ATTEMPTED VIDEO BRONCHOSCOPY ENDOBRONCHIAL ULTRASOUND  ;  Surgeon: Rigoberto Noel, MD;  Location: [facility];  Service: Thoracic;  Laterality: Right;    Social History   Socioeconomic History  . Marital status: Legally Separated    Spouse name: Not on file  . Number of children: 1  . Years of education: Not on file  . Highest education level: Not on file  Occupational History  . Occupation: retired     Fish farm manager: SELF EMPLOYED  Social Needs  . Financial resource strain: Not on file  . Food insecurity    Worry: Not on file    Inability: Not on file  . Transportation needs    Medical: Not on file    Non-medical: Not on file  Tobacco Use  . Smoking status: Never Smoker  . Smokeless tobacco: Never Used  Substance and Sexual Activity  . Alcohol use: Yes    Alcohol/week: 0.0 standard drinks    Comment: SOCIAL  . Drug use: No  . Sexual activity: Never  Lifestyle  . Physical activity    Days per week: Not on file    Minutes per session: Not on file  . Stress: Not on file  Relationships  . Social Herbalist on phone: Not on file    Gets together: Not on file    Attends religious service: Not on file    Active member of club or organization: Not on file    Attends meetings of clubs or organizations: Not on file  Relationship status: Not on file  . Intimate partner violence    Fear of current or ex partner: Not on file    Emotionally abused: Not on file    Physically abused: Not on file    Forced sexual activity: Not on file  Other Topics Concern  . Not on file  Social History Narrative   From [place], moved from [place]  to [place] 2008   Lives w/ her mother       Right handed      Highest level of edu- 7th grade      Retired from [employer] as of 04/09/2019   No Known Allergies     Medication List       Accurate as of April 09, 2019 11:59 PM. If you have any questions, ask your nurse or doctor.        STOP taking  these medications   benzonatate 200 MG capsule Commonly known as: TESSALON Stopped by: [name], MD   oxyCODONE-acetaminophen 5-325 MG tablet Commonly known as: PERCOCET/ROXICET Stopped by: [name], MD     TAKE these medications   acetaminophen 500 MG tablet Commonly known as: TYLENOL Take 1,000 mg by mouth every 6 (six) hours as needed for moderate pain or headache.   B-12 PO Take 2 tablets by mouth daily.   baclofen 10 MG tablet Commonly known as: LIORESAL Take 1 tablet (10 mg total) by mouth 3 (three) times daily.   CALCIUM 600/VITAMIN D3 PO Take by mouth.   dexamethasone 4 MG tablet Commonly known as: DECADRON Take 4mg  by mouth twice daily the day before, of, and after chemo   folic acid 1 MG tablet Commonly known as: FOLVITE Take 1 tablet (1 mg total) by mouth daily.   lidocaine-prilocaine cream Commonly known as: EMLA Apply 1 application topically as needed. Apply 1-2 tsp over port site 1-2 hours prior to chemo.   meloxicam 15 MG tablet Commonly known as: MOBIC TAKE 1 TABLET BY MOUTH EVERY DAY AS NEEDED FOR PAIN   OMEGA-3 + VITAMIN D3 PO Take 1 tablet by mouth daily.   ondansetron 8 MG tablet Commonly known as: ZOFRAN Take 8 mg by mouth every 8 (eight) hours as needed.   OVER THE COUNTER MEDICATION Apply 1 drop topically daily as needed. 1 drop CBD oil in vicks vapor rub . Applied to right thigh for pain relief.   pregabalin 75 MG capsule Commonly known as: LYRICA 1 tablet daily for 10 days, then 1 tablet twice a day Started by: [name], MD   PRESCRIPTION MEDICATION Inject 1 Dose as directed every 21 ( twenty-one) days. Chemo   prochlorperazine 10 MG tablet Commonly known as: COMPAZINE Take 1 tablet (10 mg total) by mouth every 6 (six) hours as needed for nausea or vomiting.   Tdap 5-2.5-18.5 LF-MCG/0.5 injection Commonly known as: BOOSTRIX Inject 0.5 mLs into the muscle once for 1 dose. Started by: [name], MD           Objective:    Physical Exam BP (!) 144/82 (BP Location: Left Arm, Patient Position: Sitting, Cuff Size: Small)   Pulse 88   Temp 97.7 F (36.5 C) (Temporal)   Resp 16   Ht 5\' 3"  (1.6 m)   Wt 149 lb 6 oz (67.8 kg)   LMP 06/09/1998   SpO2 100%   BMI 26.46 kg/m  General: Well developed, NAD, BMI noted Neck: No  thyromegaly  HEENT:  Normocephalic . Face symmetric, atraumatic Lungs:  CTA B Normal respiratory effort, no intercostal retractions, no accessory muscle use. Heart: RRR,  no murmur.  No pretibial edema bilaterally  Abdomen:  Not distended, soft, non-tender. No rebound or rigidity.   Skin: Exposed areas without rash. Not pale. Not jaundice Neurologic:  alert & oriented X3.  Speech normal, gait appropriate for age and unassisted Strength symmetric and appropriate for age.  Psych: Cognition and judgment appear intact.  Cooperative with normal attention span and concentration.  Behavior appropriate. No anxious or depressed appearing.     Assessment       Assessment  Hyperlipidemia Osteopenia Left pelvic kidney -- by CT done at [place]  Non-small cell lung cancer-Stage IV (T1b, N2, M1b) . Dx  March 2017  PLAN: Labs reviewed, will get FLP Hyperlipidemia:  diet controlled, check a FLP Osteopenia: Recommend calcium, vitamin D, stay active. 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  They try gabapentin, a high-dose helped to some extent but caused side effects. Patient wonders if Lyrica would be a good option.  I agreed to prescribe Lyrica 75 mg, daily then then increase to twice a day.  She is to let me know if that is working for her. I also encouraged to think about going back to Dr. Posey Pronto and continue discussing the issue.  Another option would be a local injection. Lung cancer: Closely follow-up by hematology, currently on maintenance treatment with Alimita RTC 6 months

## 2019-04-09 NOTE — Progress Notes (Signed)
Pre visit review using our clinic review tool, if applicable. No additional management support is needed unless otherwise documented below in the visit note. 

## 2019-04-10 LAB — LIPID PANEL
Cholesterol: 243 mg/dL — ABNORMAL HIGH (ref 0–200)
HDL: 69.8 mg/dL (ref >39.00)
LDL Cholesterol: 149 mg/dL — ABNORMAL HIGH (ref 0–99)
NonHDL: 172.89
Total CHOL/HDL Ratio: 3
Triglycerides: 120 mg/dL (ref 0.0–149.0)
VLDL: 24 mg/dL (ref 0.0–40.0)

## 2019-04-10 NOTE — Assessment & Plan Note (Signed)
Labs reviewed, will get FLP Hyperlipidemia:  diet controlled, check a FLP Osteopenia: Recommend calcium, vitamin D, stay active. 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  They try gabapentin, a high-dose helped to some extent but caused side effects. Patient wonders if Lyrica would be a good option.  I agreed to prescribe Lyrica 75 mg, daily then then increase to twice a day.  She is to let me know if that is working for her. I also encouraged to think about going back to Dr. Posey Pronto and continue discussing the issue.  Another option would be a local injection. Lung cancer: Closely follow-up by hematology, currently on maintenance treatment with Alimita RTC 6 months

## 2019-04-10 NOTE — Assessment & Plan Note (Addendum)
Preventive care reviewed Tdap : Rx printed  Shingles shot :12-2013 Shingrix : Benefit discussed, patient reluctant. prevnar: 2016 PNM 23: Benefit discussed, patient reluctant to proceed Had a flu shot [facility]:  Cscope , Dr Collene Mares [phone], 1 polyp, tubular adenoma, patient reluctant to proceed. Female care per gyn, last PAP 2018 per [facility], plans to see gyn again. MMG neg 04-2018  DEXA: @ gyn, had one on 10-2013 and 12-2016 (osteopenia)

## 2019-04-18 ENCOUNTER — Encounter: Payer: Self-pay | Admitting: Internal Medicine

## 2019-04-18 ENCOUNTER — Inpatient Hospital Stay: Payer: Medicare Other

## 2019-04-18 ENCOUNTER — Other Ambulatory Visit: Payer: Self-pay

## 2019-04-18 ENCOUNTER — Inpatient Hospital Stay: Payer: Medicare Other | Attending: Internal Medicine

## 2019-04-18 ENCOUNTER — Inpatient Hospital Stay (HOSPITAL_BASED_OUTPATIENT_CLINIC_OR_DEPARTMENT_OTHER): Payer: Medicare Other | Admitting: Internal Medicine

## 2019-04-18 VITALS — BP 141/79 | HR 85 | Temp 98.5°F | Resp 18 | Ht 63.0 in | Wt 149.1 lb

## 2019-04-18 DIAGNOSIS — I10 Essential (primary) hypertension: Secondary | ICD-10-CM | POA: Insufficient documentation

## 2019-04-18 DIAGNOSIS — C3431 Malignant neoplasm of lower lobe, right bronchus or lung: Secondary | ICD-10-CM | POA: Diagnosis not present

## 2019-04-18 DIAGNOSIS — M8580 Other specified disorders of bone density and structure, unspecified site: Secondary | ICD-10-CM | POA: Insufficient documentation

## 2019-04-18 DIAGNOSIS — C7951 Secondary malignant neoplasm of bone: Secondary | ICD-10-CM | POA: Diagnosis not present

## 2019-04-18 DIAGNOSIS — Z5111 Encounter for antineoplastic chemotherapy: Secondary | ICD-10-CM | POA: Insufficient documentation

## 2019-04-18 DIAGNOSIS — Z923 Personal history of irradiation: Secondary | ICD-10-CM | POA: Insufficient documentation

## 2019-04-18 DIAGNOSIS — C3491 Malignant neoplasm of unspecified part of right bronchus or lung: Secondary | ICD-10-CM

## 2019-04-18 DIAGNOSIS — Z791 Long term (current) use of non-steroidal anti-inflammatories (NSAID): Secondary | ICD-10-CM | POA: Diagnosis not present

## 2019-04-18 DIAGNOSIS — E7800 Pure hypercholesterolemia, unspecified: Secondary | ICD-10-CM | POA: Diagnosis not present

## 2019-04-18 DIAGNOSIS — Z95828 Presence of other vascular implants and grafts: Secondary | ICD-10-CM

## 2019-04-18 DIAGNOSIS — Z9221 Personal history of antineoplastic chemotherapy: Secondary | ICD-10-CM | POA: Diagnosis not present

## 2019-04-18 DIAGNOSIS — Z79899 Other long term (current) drug therapy: Secondary | ICD-10-CM | POA: Diagnosis not present

## 2019-04-18 LAB — CBC WITH DIFFERENTIAL (CANCER CENTER ONLY)
Abs Immature Granulocytes: 0.02 10*3/uL (ref 0.00–0.07)
Basophils Absolute: 0 10*3/uL (ref 0.0–0.1)
Basophils Relative: 0 %
Eosinophils Absolute: 0 10*3/uL (ref 0.0–0.5)
Eosinophils Relative: 0 %
HCT: 31.7 % — ABNORMAL LOW (ref 36.0–46.0)
Hemoglobin: 10.6 g/dL — ABNORMAL LOW (ref 12.0–15.0)
Immature Granulocytes: 0 %
Lymphocytes Relative: 12 %
Lymphs Abs: 0.7 10*3/uL (ref 0.7–4.0)
MCH: 34.1 pg — ABNORMAL HIGH (ref 26.0–34.0)
MCHC: 33.4 g/dL (ref 30.0–36.0)
MCV: 101.9 fL — ABNORMAL HIGH (ref 80.0–100.0)
Monocytes Absolute: 0.7 10*3/uL (ref 0.1–1.0)
Monocytes Relative: 11 %
Neutro Abs: 4.4 10*3/uL (ref 1.7–7.7)
Neutrophils Relative %: 77 %
Platelet Count: 264 10*3/uL (ref 150–400)
RBC: 3.11 MIL/uL — ABNORMAL LOW (ref 3.87–5.11)
RDW: 14.5 % (ref 11.5–15.5)
WBC Count: 5.8 10*3/uL (ref 4.0–10.5)
nRBC: 0 % (ref 0.0–0.2)

## 2019-04-18 LAB — CMP (CANCER CENTER ONLY)
ALT: 14 U/L (ref 0–44)
AST: 32 U/L (ref 15–41)
Albumin: 3.5 g/dL (ref 3.5–5.0)
Alkaline Phosphatase: 69 U/L (ref 38–126)
Anion gap: 9 (ref 5–15)
BUN: 24 mg/dL — ABNORMAL HIGH (ref 8–23)
CO2: 24 mmol/L (ref 22–32)
Calcium: 9.7 mg/dL (ref 8.9–10.3)
Chloride: 105 mmol/L (ref 98–111)
Creatinine: 1.14 mg/dL — ABNORMAL HIGH (ref 0.44–1.00)
GFR, Est AFR Am: 56 mL/min — ABNORMAL LOW (ref >60)
GFR, Estimated: 48 mL/min — ABNORMAL LOW (ref >60)
Glucose, Bld: 105 mg/dL — ABNORMAL HIGH (ref 70–99)
Potassium: 3.9 mmol/L (ref 3.5–5.1)
Sodium: 138 mmol/L (ref 135–145)
Total Bilirubin: 0.5 mg/dL (ref 0.3–1.2)
Total Protein: 7 g/dL (ref 6.5–8.1)

## 2019-04-18 MED ORDER — PROCHLORPERAZINE MALEATE 10 MG PO TABS
10.0000 mg | ORAL_TABLET | Freq: Once | ORAL | Status: AC
  Administered 2019-04-18: 10 mg via ORAL

## 2019-04-18 MED ORDER — SODIUM CHLORIDE 0.9 % IV SOLN
480.0000 mg/m2 | Freq: Once | INTRAVENOUS | Status: AC
  Administered 2019-04-18: 900 mg via INTRAVENOUS
  Filled 2019-04-18: qty 20

## 2019-04-18 MED ORDER — SODIUM CHLORIDE 0.9% FLUSH
10.0000 mL | Freq: Once | INTRAVENOUS | Status: AC
  Administered 2019-04-18: 10 mL
  Filled 2019-04-18: qty 10

## 2019-04-18 MED ORDER — SODIUM CHLORIDE 0.9 % IV SOLN
Freq: Once | INTRAVENOUS | Status: AC
  Administered 2019-04-18: 10:00:00 via INTRAVENOUS
  Filled 2019-04-18: qty 250

## 2019-04-18 MED ORDER — ZOLEDRONIC ACID 4 MG/100ML IV SOLN
4.0000 mg | Freq: Once | INTRAVENOUS | Status: AC
  Administered 2019-04-18: 4 mg via INTRAVENOUS
  Filled 2019-04-18: qty 100

## 2019-04-18 MED ORDER — PROCHLORPERAZINE MALEATE 10 MG PO TABS
ORAL_TABLET | ORAL | Status: AC
  Filled 2019-04-18: qty 1

## 2019-04-18 MED ORDER — HEPARIN SOD (PORK) LOCK FLUSH 100 UNIT/ML IV SOLN
500.0000 [IU] | Freq: Once | INTRAVENOUS | Status: AC | PRN
  Administered 2019-04-18: 500 [IU]
  Filled 2019-04-18: qty 5

## 2019-04-18 MED ORDER — SODIUM CHLORIDE 0.9% FLUSH
10.0000 mL | INTRAVENOUS | Status: DC | PRN
  Administered 2019-04-18: 10 mL
  Filled 2019-04-18: qty 10

## 2019-04-18 NOTE — Patient Instructions (Signed)

## 2019-04-18 NOTE — Progress Notes (Signed)
[facility] [phone]   Fax:(336) [place], [place] [address] [place] [address]  DIAGNOSIS: Stage IV (T1b, N2, M1b) non-small cell lung cancer, adenocarcinoma diagnosed in March 2017 and presented with right lower lobe lung nodule in addition to mediastinal lymphadenopathy and metastatic bone lesions.  Molecular studies: PDL 1 TPS  <1%.   Foundation One Studies: Positive for ERBB2 [id number]. Negative for EGFR, KRAS, ALK, BRAF, MET, RET and ROS1.  PRIOR THERAPY:  1) Induction systemic chemotherapy with carboplatin for AUC of 5 and Alimta 500 MG/M2 every 3 weeks is status post 6 cycles at the [facility], last dose was given 03/05/2016 with stable disease. 2) palliative radiation to the metastatic bone disease in the lower back and pelvic area.  CURRENT THERAPY::  1)  Maintenance systemic chemotherapy with Alimta 500 MG/M2 every 3 weeks status post 53 cycles. First dose was given 03/25/2016.  2) Zometa 4 mg IV every 12 week for metastatic bone disease.  INTERVAL HIST Valerie Long 73 y.o. female returns to the clinic today for follow-up visit.  The patient is feeling fine today with no concerning complaints except for the intermittent low back pain and she takes Tylenol on as-needed basis.  She denied having any current chest pain, shortness of breath, cough or hemoptysis.  She denied having any fever or chills.  She has no nausea, vomiting, diarrhea or constipation.  She has no headache or visual changes.  MEDICAL HISTORY: Past Medical History:  Diagnosis Date   Adenocarcinoma of right lung, stage 4 ([place]) 2017   Anemia    Arthritis    Bone metastases ([place]) 02/24/2017   Encounter for antineoplastic chemotherapy 03/17/2016   Hypercholesterolemia    Hypertension 06/18/2016   Osteopenia    Pelvic kidney    Left. On CT in [place]   Pneumonia    Shortness of  breath dyspnea     ALLERGIES:  has No Known Allergies.  MEDICATIONS:  Current Outpatient Medications  Medication Sig Dispense Refill   acetaminophen (TYLENOL) 500 MG tablet Take 1,000 mg by mouth every 6 (six) hours as needed for moderate pain or headache.     baclofen (LIORESAL) 10 MG tablet Take 1 tablet (10 mg total) by mouth 3 (three) times daily. (Patient not taking: Reported on 03/28/2019) 30 each 0   Calcium Carb-Cholecalciferol (CALCIUM 600/VITAMIN D3 PO) Take by mouth.     Cyanocobalamin (B-12 PO) Take 2 tablets by mouth daily.     dexamethasone (DECADRON) 4 MG tablet Take 54m by mouth twice daily the day before, of, and after chemo 40 tablet 1   Fish Oil-Cholecalciferol (OMEGA-3 + VITAMIN D3 PO) Take 1 tablet by mouth daily.     folic acid (FOLVITE) 1 MG tablet Take 1 tablet (1 mg total) by mouth daily. 90 tablet 0   lidocaine-prilocaine (EMLA) cream Apply 1 application topically as needed. Apply 1-2 tsp over port site 1-2 hours prior to chemo. 30 g 0   meloxicam (MOBIC) 15 MG tablet TAKE 1 TABLET BY MOUTH EVERY DAY AS NEEDED FOR PAIN 30 tablet 0   ondansetron (ZOFRAN) 8 MG tablet Take 8 mg by mouth every 8 (eight) hours as needed.     OVER THE COUNTER MEDICATION Apply 1 drop topically daily as needed. 1 drop CBD oil in vicks vapor rub . Applied to right thigh for pain relief.  pregabalin (LYRICA) 75 MG capsule 1 tablet daily for 10 days, then 1 tablet twice a day 60 capsule 3   PRESCRIPTION MEDICATION Inject 1 Dose as directed every 21 ( twenty-one) days. Chemo     prochlorperazine (COMPAZINE) 10 MG tablet Take 1 tablet (10 mg total) by mouth every 6 (six) hours as needed for nausea or vomiting. 30 tablet 0   No current facility-administered medications for this visit.     SURGICAL HISTORY:  Past Surgical History:  Procedure Laterality Date   CESAREAN SECTION     myomectomy   COLONOSCOPY W/ POLYPECTOMY     IR GENERIC HISTORICAL  08/17/2016   IR US GUIDE  VASC ACCESS RIGHT 08/17/2016 [name], MD [facility]   IR GENERIC HISTORICAL  08/17/2016   IR FLUORO GUIDE PORT INSERTION RIGHT 08/17/2016 [name], MD [facility]   MEDIASTINOSCOPY N/A 09/25/2015   Procedure: MEDIASTINOSCOPY;  Surgeon: Melrose Nakayama, MD;  Location: [facility];  Service: Thoracic;  Laterality: N/A;   VIDEO BRONCHOSCOPY Bilateral 08/19/2015   Procedure: VIDEO BRONCHOSCOPY WITH FLUORO;  Surgeon: Rigoberto Noel, MD;  Location: [facility];  Service: Cardiopulmonary;  Laterality: Bilateral;   VIDEO BRONCHOSCOPY N/A 09/08/2015   Procedure: VIDEO BRONCHOSCOPY WITH FLUORO;  Surgeon: Rigoberto Noel, MD;  Location: [facility];  Service: Thoracic;  Laterality: N/A;   VIDEO BRONCHOSCOPY WITH ENDOBRONCHIAL ULTRASOUND Right 09/08/2015   Procedure: ATTEMPTED VIDEO BRONCHOSCOPY ENDOBRONCHIAL ULTRASOUND  ;  Surgeon: Rigoberto Noel, MD;  Location: [facility];  Service: Thoracic;  Laterality: Right;    REVIEW OF SYSTEMS:  A comprehensive review of systems was negative except for: Constitutional: positive for fatigue Musculoskeletal: positive for back pain   PHYSICAL EXAMINATION: General appearance: alert, cooperative, fatigued and no distress Head: Normocephalic, without obvious abnormality, atraumatic Neck: no adenopathy, no JVD, supple, symmetrical, trachea midline and thyroid not enlarged, symmetric, no tenderness/mass/nodules Lymph nodes: Cervical, supraclavicular, and axillary nodes normal. Resp: clear to auscultation bilaterally Back: symmetric, no curvature. ROM normal. No CVA tenderness. Cardio: regular rate and rhythm, S1, S2 normal, no murmur, click, rub or gallop GI: soft, non-tender; bowel sounds normal; no masses,  no organomegaly Extremities: extremities normal, atraumatic, no cyanosis or edema  ECOG PERFORMANCE STATUS: 1 - Symptomatic but completely ambulatory  Blood pressure (!) 141/79, pulse 85, temperature 98.5 F (36.9 C), temperature source Temporal, resp. rate  18, height 5' 3" (1.6 m), weight 149 lb 1.6 oz (67.6 kg), last menstrual period 06/09/1998, SpO2 100 %.  LABORATORY DATA: Lab Results  Component Value Date   WBC 5.8 04/18/2019   HGB 10.6 (L) 04/18/2019   HCT 31.7 (L) 04/18/2019   MCV 101.9 (H) 04/18/2019   PLT 264 04/18/2019      Chemistry      Component Value Date/Time   NA 139 03/28/2019 0833   NA 137 06/09/2017 0901   K 4.1 03/28/2019 0833   K 3.9 06/09/2017 0901   CL 104 03/28/2019 0833   CO2 23 03/28/2019 0833   CO2 25 06/09/2017 0901   BUN 23 03/28/2019 0833   BUN 19.0 06/09/2017 0901   CREATININE 1.19 (H) 03/28/2019 0833   CREATININE 1.2 (H) 06/09/2017 0901   GLU 170 01/23/2016      Component Value Date/Time   CALCIUM 9.4 03/28/2019 0833   CALCIUM 9.9 06/09/2017 0901   ALKPHOS 64 03/28/2019 0833   ALKPHOS 63 06/09/2017 0901   AST 28 03/28/2019 0833   AST 35 (H) 06/09/2017 0901   ALT 13  03/28/2019 0833   ALT 23 06/09/2017 0901   BILITOT 0.6 03/28/2019 0833   BILITOT 0.65 06/09/2017 0901       RADIOGRAPHIC STUDIES: Ct Chest W Contrast  Result Date: 03/27/2019 CLINICAL DATA:  Non-small-cell lung cancer diagnosed 3/17. Lymph node and bone metastasis. Chemotherapy ongoing. Radiation therapy in 2018. Low back and left leg pain. Shortness of breath. Cough for 2 weeks. EXAM: CT CHEST, ABDOMEN, AND PELVIS WITH CONTRAST TECHNIQUE: Multidetector CT imaging of the chest, abdomen and pelvis was performed following the standard protocol during bolus administration of intravenous contrast. CONTRAST:  18m OMNIPAQUE IOHEXOL 300 MG/ML  SOLN COMPARISON:  12/11/2018 FINDINGS: CT CHEST FINDINGS Cardiovascular: Right Port-A-Cath tip at superior caval/atrial junction. Normal heart size, without pericardial effusion. Aortic atherosclerosis. No central pulmonary embolism, on this non-dedicated study. Mediastinum/Nodes: No supraclavicular adenopathy. No mediastinal or hilar adenopathy. Lungs/Pleura: No pleural fluid. Bilateral  pulmonary nodules. Index central right upper lobe pulmonary nodule measures 1.2 cm on 40/4 and is unchanged. A superior segment right lower lobe pulmonary nodule on the same image measures 1.0 cm today versus similar (when remeasured). Anterior right lower lobe lung mass measures 3.5 x 2.5 cm on 77/4 versus 3.7 x 2.9 cm on the prior exam (when remeasured). Lingular nodule measures 1.0 cm on 67/4 and is similar (when remeasured). A left lower lobe pulmonary nodule measures 1.3 cm on 80/4 versus 1.1 cm on the prior exam (when remeasured). Musculoskeletal: Multifocal sclerotic osseous metastasis, similar severity and distribution. CT ABDOMEN PELVIS FINDINGS Hepatobiliary: Normal liver. Multiple small gallstones without acute cholecystitis or biliary duct dilatation. Pancreas: Normal, without mass or ductal dilatation. Spleen: Normal in size, without focal abnormality. Adrenals/Urinary Tract: Normal adrenal glands. Normal right kidney. Left pelvic kidney with malrotation. Normal urinary bladder. Stomach/Bowel: Normal stomach, without wall thickening. There is small bowel nonrotation, with colon positioned in the left side of the abdomen and small bowel positioned in the right-side of the abdomen. Colonic stool burden suggests constipation. The appendix is positioned in the central upper abdomen, as is the terminal ileum. Otherwise normal. Vascular/Lymphatic: Aortic and branch vessel atherosclerosis. No abdominopelvic adenopathy. Reproductive: Normal uterus and adnexa. Other: No significant free fluid. No evidence of omental or peritoneal disease. Tiny periumbilical fat containing ventral wall hernia. Musculoskeletal: Multifocal sclerotic osseous metastasis are similar in size and distribution. S shaped thoracolumbar spine curvature. IMPRESSION: 1. Similar disease burden within the lungs. Although the right lower lobe lung mass measures slightly smaller, the majority of lung nodules are similar and a left lower lobe lung  nodule has slightly enlarged. 2. No thoracic adenopathy. 3. No soft tissue metastasis within the abdomen or pelvis. 4. Similar sclerotic osseous metastasis. 5.  Aortic Atherosclerosis (ICD10-I70.0). 6. Small bowel nonrotation. No acute complication. Possible constipation. 7. Left pelvic kidney. 8. Cholelithiasis. Electronically Signed   By: KAbigail MiyamotoM.D.   On: 03/27/2019 15:04   Ct Abdomen Pelvis W Contrast  Result Date: 03/27/2019 CLINICAL DATA:  Non-small-cell lung cancer diagnosed 3/17. Lymph node and bone metastasis. Chemotherapy ongoing. Radiation therapy in 2018. Low back and left leg pain. Shortness of breath. Cough for 2 weeks. EXAM: CT CHEST, ABDOMEN, AND PELVIS WITH CONTRAST TECHNIQUE: Multidetector CT imaging of the chest, abdomen and pelvis was performed following the standard protocol during bolus administration of intravenous contrast. CONTRAST:  1072mOMNIPAQUE IOHEXOL 300 MG/ML  SOLN COMPARISON:  12/11/2018 FINDINGS: CT CHEST FINDINGS Cardiovascular: Right Port-A-Cath tip at superior caval/atrial junction. Normal heart size, without pericardial effusion. Aortic atherosclerosis. No central pulmonary  embolism, on this non-dedicated study. Mediastinum/Nodes: No supraclavicular adenopathy. No mediastinal or hilar adenopathy. Lungs/Pleura: No pleural fluid. Bilateral pulmonary nodules. Index central right upper lobe pulmonary nodule measures 1.2 cm on 40/4 and is unchanged. A superior segment right lower lobe pulmonary nodule on the same image measures 1.0 cm today versus similar (when remeasured). Anterior right lower lobe lung mass measures 3.5 x 2.5 cm on 77/4 versus 3.7 x 2.9 cm on the prior exam (when remeasured). Lingular nodule measures 1.0 cm on 67/4 and is similar (when remeasured). A left lower lobe pulmonary nodule measures 1.3 cm on 80/4 versus 1.1 cm on the prior exam (when remeasured). Musculoskeletal: Multifocal sclerotic osseous metastasis, similar severity and distribution. CT  ABDOMEN PELVIS FINDINGS Hepatobiliary: Normal liver. Multiple small gallstones without acute cholecystitis or biliary duct dilatation. Pancreas: Normal, without mass or ductal dilatation. Spleen: Normal in size, without focal abnormality. Adrenals/Urinary Tract: Normal adrenal glands. Normal right kidney. Left pelvic kidney with malrotation. Normal urinary bladder. Stomach/Bowel: Normal stomach, without wall thickening. There is small bowel nonrotation, with colon positioned in the left side of the abdomen and small bowel positioned in the right-side of the abdomen. Colonic stool burden suggests constipation. The appendix is positioned in the central upper abdomen, as is the terminal ileum. Otherwise normal. Vascular/Lymphatic: Aortic and branch vessel atherosclerosis. No abdominopelvic adenopathy. Reproductive: Normal uterus and adnexa. Other: No significant free fluid. No evidence of omental or peritoneal disease. Tiny periumbilical fat containing ventral wall hernia. Musculoskeletal: Multifocal sclerotic osseous metastasis are similar in size and distribution. S shaped thoracolumbar spine curvature. IMPRESSION: 1. Similar disease burden within the lungs. Although the right lower lobe lung mass measures slightly smaller, the majority of lung nodules are similar and a left lower lobe lung nodule has slightly enlarged. 2. No thoracic adenopathy. 3. No soft tissue metastasis within the abdomen or pelvis. 4. Similar sclerotic osseous metastasis. 5.  Aortic Atherosclerosis (ICD10-I70.0). 6. Small bowel nonrotation. No acute complication. Possible constipation. 7. Left pelvic kidney. 8. Cholelithiasis. Electronically Signed   By: Abigail Miyamoto M.D.   On: 03/27/2019 15:04    ASSESSMENT AND PLAN:  This is a very pleasant 73 years old Hispanic female with metastatic non-small cell lung cancer, adenocarcinoma status post induction systemic chemotherapy with carboplatin and Alimta and she is currently on maintenance  treatment with single agent Alimta status post 53 cycles. The patient has been tolerating this treatment well with no concerning adverse effects. I recommended for her to proceed with cycle #54 today as planned. She will come back for follow-up visit in 3 weeks for evaluation before the next cycle of her treatment. She was advised to call immediately if she has any concerning symptoms in the interval. The patient voices understanding of current disease status and treatment options and is in agreement with the current care plan. All questions were answered. The patient knows to call the clinic with any problems, questions or concerns. We can certainly see the patient much sooner if necessary.  Disclaimer: This note was dictated with voice recognition software. Similar sounding words can inadvertently be transcribed and may not be corrected upon review.

## 2019-04-18 NOTE — Patient Instructions (Addendum)
[facility] Discharge Instructions for Patients Receiving Chemotherapy  Today you received the following chemotherapy agents: Pemetrexed (Alimta)  To help prevent nausea and vomiting after your treatment, we encourage you to take your nausea medication as directed.    If you develop nausea and vomiting that is not controlled by your nausea medication, call the clinic.   BELOW ARE SYMPTOMS THAT SHOULD BE REPORTED IMMEDIATELY:  *FEVER GREATER THAN 100.5 F  *CHILLS WITH OR WITHOUT FEVER  NAUSEA AND VOMITING THAT IS NOT CONTROLLED WITH YOUR NAUSEA MEDICATION  *UNUSUAL SHORTNESS OF BREATH  *UNUSUAL BRUISING OR BLEEDING  TENDERNESS IN MOUTH AND THROAT WITH OR WITHOUT PRESENCE OF ULCERS  *URINARY PROBLEMS  *BOWEL PROBLEMS  UNUSUAL RASH Items with * indicate a potential emergency and should be followed up as soon as possible.  Feel free to call the clinic should you have any questions or concerns. The clinic phone number is [phone].  Please show the [facility] at check-in to the Emergency Department and triage nurse.  Zoledronic Acid injection (Hypercalcemia, Oncology) O que  este medicamento? O CIDO ZOLEDRNICO diminui a quantidade da perda de [employer].  usado para tratar o excesso de clcio no sangue provocado pelo cncer. Tambm  usado para prevenir as complicaes de cnceres que tenham se espalhado para os ossos. Este medicamento pode ser usado para outros propsitos; em caso de dvidas, pergunte ao seu profissional de sade ou farmacutico. NOMES DE MARCAS COMUNS: Zometa O que devo dizer a meu profissional de sade antes de tomar este medicamento? Precisam saber se voc tem algum dos seguintes problemas ou estados de sade:  asma induzida por aspirina  cncer, principalmente se estiver tomando medicamentos usados para o tratamento de cncer  doenas dentrias ou uso de dentaduras  infeco  doenas renais  recebendo corticoides,  como dexametasona ou prednisona  reao estranha ou alergia ao cido zoledrnico  reao estranha ou alergia a outros medicamentos  reao estranha ou alergia a alimentos, corantes ou conservantes  est grvida ou tentando engravidar  est amamentando Como devo usar este medicamento? Este medicamento  para infuso intravenosa. Este medicamento  administrado por um profissional da sade no hospital ou em consultrio. Fale com seu pediatra a respeito do uso deste medicamento em crianas. Pode ser preciso tomar alguns cuidados especiais. Superdosagem: Se achar que tomou uma superdosagem deste medicamento, entre em contato imediatamente com o [facility] ou v a [employer]. OBSERVAO: Este medicamento  s para voc. No compartilhe este medicamento com outras pessoas. E se eu deixar de tomar uma dose?  importante que voc no perca nenhuma dose. Se no puder comparecer a uma consulta, entre em contato com seu mdico ou profissional de sade. O que pode interagir com este medicamento?  alguns antibiticos aplicados como injeo  AINEs, medicamentos analgsicos e anti-inflamatrios, como ibuprofeno, naproxeno  alguns diurticos, como bumetanida, furosemida  teriparatida  talidomida Esta lista pode no descrever todas as interaes possveis. D ao seu profissional de sade uma lista de todos os medicamentos, ervas medicinais, remdios de venda livre, ou suplementos alimentares que voc [place]. Diga tambm se voc fuma, bebe, ou [place] drogas ilcitas. Alguns destes podem interagir com o seu medicamento. Ao que devo ficar atento quando estiver [employer] medicamento? Consulte seu mdico ou profissional de sade para acompanhamento regular Museum/gallery curator. Os benefcios deste medicamento podem demorar algum tempo para aparecer. No pare de usar [employer] a menos que seu mdico [place]. Seu  mdico pode pedir exames de sangue e outros exames para monitorar a sua  evoluo. Mulheres que queiram engravidar ou que acreditem que possam estar grvidas devem avisar aos seus mdicos. H risco de graves efeitos colaterais no feto. Para mais informaes, fale com seu mdico, profissional de sade ou farmacutico. Voc deve consumir clcio e vitamina D em quantidade suficiente enquanto estiver [employer]. [place] com seu mdico ou profissional de sade a respeito dos alimentos que consome e das vitaminas que toma. Algumas pessoas que tomam este medicamento sofrem fortes dores nos ossos, articulaes ou msculos. Este medicamento tambm pode aumentar os riscos de problemas no maxilar ou de uma fratura do fmur. Informe o seu mdico imediatamente se sentir dores fortes no maxilar, ossos, articulaes ou msculos. Avise seu mdico se tiver uma dor que no passa ou que piora. Informe ao seu cirurgio-dentista que est tomando este medicamento. Voc no deve passar por nenhuma cirurgia dental de grande porte enquanto tomar [employer]. Consulte o seu dentista para fazer uma avaliao odontolgica e corrigir quaisquer problemas dentrios antes de comear a Systems developer. Mantenha uma boa higiene oral enquanto tomar [employer]. Assegure-se de que far consultas de acompanhamento odontolgico regularmente. Que efeitos colaterais posso sentir aps usar este medicamento? Efeitos colaterais que devem ser informados ao seu mdico ou profissional de sade o mais rpido possvel:  reaes alrgicas, como erupo na pele, coceira, urticria, ou inchao do rosto, dos lbios ou da lngua  [place], confuso ou depresso  dificuldade para respirar  alteraes na viso  dor nos olhos  sensao de [place], [place], quedas  dor na mandbula ou maxilar, principalmente depois de um procedimento odontolgico  aftas  cibras, rigidez ou fraqueza muscular  vermelhido, bolhas, descamao ou afrouxamento da pele, inclusive dentro da  boca  dificuldade para urinar ou alterao na quantidade de urina Efeitos colaterais que normalmente no precisam de cuidados mdicos (avise ao seu mdico ou profissional de sade se persistirem ou forem incmodos):  dor nos ossos, articulaes ou msculos  priso de ventre  diarreia  febre  queda de cabelo  irritao no local da injeo  perda de apetite  enjoo ou vmitos  desarranjo estomacal  dificuldade para dormir  dificuldade para engolir  fraqueza ou cansao Esta lista pode no descrever todos os efeitos colaterais possveis. Para mais orientaes sobre efeitos colaterais, consulte o seu mdico. Voc pode relatar a ocorrncia de efeitos colaterais  FDA pelo telefone [phone]. Onde devo guardar meu medicamento? Este medicamento  administrado por um profissional da sade no hospital ou em consultrio. Voc no receber este medicamento para conservao em casa. OBSERVAO: Este folheto  um resumo. Pode no cobrir todas as informaes possveis. Se tiver dvidas a respeito deste medicamento, fale com seu mdico, farmacutico ou profissional de sade.  2020 Elsevier/Gold Standard (2016-06-24 00:00:00)  Coronavirus (COVID-19) Are you at risk?  Are you at risk for the Coronavirus (COVID-19)?  To be considered HIGH RISK for Coronavirus (COVID-19), you have to meet the following criteria:  . Traveled to [place], [place], [place], [place] or [place]; or in the [place] to [place], [place], [place], or [place]; and have fever, cough, and shortness of breath within the last 2 weeks of travel OR . Been in close contact with a person diagnosed with COVID-19 within the last 2 weeks and have fever, cough, and shortness of breath . IF YOU DO NOT MEET THESE CRITERIA, YOU ARE CONSIDERED LOW RISK FOR COVID-19.  What to do if  you are HIGH RISK for COVID-19?  Marland Kitchen If you are having a medical emergency, call 911. . Seek medical care right away. Before you go to a  doctor's office, urgent care or emergency department, call ahead and tell them about your recent travel, contact with someone diagnosed with COVID-19, and your symptoms. You should receive instructions from your physician's office regarding next steps of care.  . When you arrive at healthcare provider, tell the healthcare staff immediately you have returned from visiting [place], [place], [place], [place] or [place]; or traveled in the [place] to [place], [place], [place], or [place]; in the last two weeks or you have been in close contact with a person diagnosed with COVID-19 in the last 2 weeks.   . Tell the health care staff about your symptoms: fever, cough and shortness of breath. . After you have been seen by a medical provider, you will be either: o Tested for (COVID-19) and discharged home on quarantine except to seek medical care if symptoms worsen, and asked to  - Stay home and avoid contact with others until you get your results (4-5 days)  - Avoid travel on public transportation if possible (such as bus, train, or airplane) or o Sent to the Emergency Department by EMS for evaluation, COVID-19 testing, and possible admission depending on your condition and test results.  What to do if you are LOW RISK for COVID-19?  Reduce your risk of any infection by using the same precautions used for avoiding the common cold or flu:  Marland Kitchen Wash your hands often with soap and warm water for at least 20 seconds.  If soap and water are not readily available, use an alcohol-based hand sanitizer with at least 60% alcohol.  . If coughing or sneezing, cover your mouth and nose by coughing or sneezing into the elbow areas of your shirt or coat, into a tissue or into your sleeve (not your hands). . Avoid shaking hands with others and consider head nods or verbal greetings only. . Avoid touching your eyes, nose, or mouth with unwashed hands.  . Avoid close contact with people who are sick. . Avoid  places or events with large numbers of people in one location, like concerts or sporting events. . Carefully consider travel plans you have or are making. . If you are planning any travel outside or inside the [place], visit the CDC's Travelers' Health webpage for the latest health notices. . If you have some symptoms but not all symptoms, continue to monitor at home and seek medical attention if your symptoms worsen. . If you are having a medical emergency, call 911.   [place] / e-Visit: [email or web address]         [facility]: [facility]: [phone]                   [facility]: [phone]

## 2019-04-19 ENCOUNTER — Telehealth: Payer: Self-pay | Admitting: Internal Medicine

## 2019-04-19 NOTE — Telephone Encounter (Signed)
Scheduled per los. Called and left msg. Mailed printout  °

## 2019-05-07 DIAGNOSIS — H26493 Other secondary cataract, bilateral: Secondary | ICD-10-CM | POA: Diagnosis not present

## 2019-05-07 DIAGNOSIS — H04123 Dry eye syndrome of bilateral lacrimal glands: Secondary | ICD-10-CM | POA: Diagnosis not present

## 2019-05-07 DIAGNOSIS — D23111 Other benign neoplasm of skin of right upper eyelid, including canthus: Secondary | ICD-10-CM | POA: Diagnosis not present

## 2019-05-08 NOTE — Progress Notes (Signed)
[place], MD [address] [place] [address]  DIAGNOSIS: Stage IV (T1b, N2, M1b) non-small cell lung cancer, adenocarcinoma diagnosed in March 2017 and presented with right lower lobe lung nodule in addition to mediastinal lymphadenopathy and metastatic bone lesions.  Molecular studies:PDL 1 TPS <1%.   [employer] Studies: Positive for ERBB2 [id number]. Negative for EGFR, KRAS, ALK, BRAF, MET, RET and ROS1.  PRIOR THERAPY:  1) Induction systemic chemotherapy with carboplatin for AUC of 5 and Alimta 500 MG/M2 every 3 weeks is status post 6 cycles at the [facility], last dose was given 03/05/2016 with stable disease. 2) palliative radiation to the metastatic bone disease in the lower back and pelvic area.  CURRENT THERAPY: 1) Maintenance systemic chemotherapy with Alimta 500 MG/M2 every 3 weeks status post 54cycles. First dose was given 03/25/2016.  2) Zometa 4 mg IV every 12 week for metastatic bone disease.  INTERVAL HISTORY: Valerie Long 73 y.o. female returns to the clinic for a follow up visit. The patient is feeling well today without any concerning complaints. She denies any fever, nasal congestion, chills, sore throats, myalgias, chest pain, or shortness of breath. The patient continues to tolerate treatment with systemic chemotherapy with Alimta well without any adverse side effects. Denies any night sweats or weight loss. Denies any hemoptysis. Denies any nausea, vomiting, diarrhea, or constipation. Denies any headache or visual changes except those related to her recent cataract surgery. She is here today for evaluation before starting cycle #55.   MEDICAL HISTORY: Past Medical History:  Diagnosis Date  . Adenocarcinoma of right lung, stage 4 ([facility]) 2017  . Anemia   . Arthritis   . Bone metastases ([facility]) 02/24/2017  . Encounter for antineoplastic chemotherapy 03/17/2016  .  Hypercholesterolemia   . Hypertension 06/18/2016  . Osteopenia   . 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 Pneumonia   . Shortness of breath dyspnea     ALLERGIES:  has No Known Allergies.  MEDICATIONS:  Current Outpatient Medications  Medication Sig Dispense Refill  . acetaminophen (TYLENOL) 500 MG tablet Take 1,000 mg by mouth every 6 (six) hours as needed for moderate pain or headache.    . baclofen (LIORESAL) 10 MG tablet Take 1 tablet (10 mg total) by mouth 3 (three) times daily. (Patient not taking: Reported on 03/28/2019) 30 each 0  . Calcium Carb-Cholecalciferol (CALCIUM 600/VITAMIN D3 PO) Take by mouth.    . Cyanocobalamin (B-12 PO) Take 2 tablets by mouth daily.    Marland Kitchen dexamethasone (DECADRON) 4 MG tablet Take 68m by mouth twice daily the day before, of, and after chemo 40 tablet 1  . Fish Oil-Cholecalciferol (OMEGA-3 + VITAMIN D3 PO) Take 1 tablet by mouth daily.    . folic acid (FOLVITE) 1 MG tablet Take 1 tablet (1 mg total) by mouth daily. 90 tablet 0  . lidocaine-prilocaine (EMLA) cream Apply 1 application topically as needed. Apply 1-2 tsp over port site 1-2 hours prior to chemo. 30 g 0  . meloxicam (MOBIC) 15 MG tablet TAKE 1 TABLET BY MOUTH EVERY DAY AS NEEDED FOR PAIN 30 tablet 0  . ondansetron (ZOFRAN) 8 MG tablet Take 8 mg by mouth every 8 (eight) hours as needed.    .Marland KitchenOVER THE COUNTER MEDICATION Apply 1 drop topically daily as needed. 1 drop CBD oil in vicks vapor rub . Applied to right thigh for  pain relief.    . pregabalin (LYRICA) 75 MG capsule 1 tablet daily for 10 days, then 1 tablet twice a day (Patient not taking: Reported on 04/18/2019) 60 capsule 3  . PRESCRIPTION MEDICATION Inject 1 Dose as directed every 21 ( twenty-one) days. Chemo    . prochlorperazine (COMPAZINE) 10 MG tablet Take 1 tablet (10 mg total) by mouth every 6 (six) hours as needed for nausea or vomiting. 30 tablet 0   No current facility-administered medications for this visit.      SURGICAL HISTORY:  Past Surgical History:  Procedure Laterality Date  . CESAREAN SECTION     myomectomy  . COLONOSCOPY W/ POLYPECTOMY    . IR GENERIC HISTORICAL  08/17/2016   IR US GUIDE VASC ACCESS RIGHT 08/17/2016 [name], MD [facility]  . IR GENERIC HISTORICAL  08/17/2016   IR FLUORO GUIDE PORT INSERTION RIGHT 08/17/2016 [name], MD [facility]  . MEDIASTINOSCOPY N/A 09/25/2015   Procedure: MEDIASTINOSCOPY;  Surgeon: Melrose Nakayama, MD;  Location: [facility];  Service: Thoracic;  Laterality: N/A;  . VIDEO BRONCHOSCOPY Bilateral 08/19/2015   Procedure: VIDEO BRONCHOSCOPY WITH FLUORO;  Surgeon: Rigoberto Noel, MD;  Location: [facility];  Service: Cardiopulmonary;  Laterality: Bilateral;  . VIDEO BRONCHOSCOPY N/A 09/08/2015   Procedure: VIDEO BRONCHOSCOPY WITH FLUORO;  Surgeon: Rigoberto Noel, MD;  Location: [facility];  Service: Thoracic;  Laterality: N/A;  . VIDEO BRONCHOSCOPY WITH ENDOBRONCHIAL ULTRASOUND Right 09/08/2015   Procedure: ATTEMPTED VIDEO BRONCHOSCOPY ENDOBRONCHIAL ULTRASOUND  ;  Surgeon: Rigoberto Noel, MD;  Location: [facility];  Service: Thoracic;  Laterality: Right;    REVIEW OF SYSTEMS:   Review of Systems  Constitutional: Negative for appetite change, chills, fatigue, fever and unexpected weight change.  HENT: Negative for mouth sores, nosebleeds, sore throat and trouble swallowing.   Eyes: Negative for eye problems and icterus.  Respiratory: Negative for cough, hemoptysis, shortness of breath and wheezing.   Cardiovascular: Positive for bilateral lower extremity swelling. Negative for chest pain. Gastrointestinal: Negative for abdominal pain, constipation, diarrhea, nausea and vomiting.  Genitourinary: Negative for bladder incontinence, difficulty urinating, dysuria, frequency and hematuria.   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 Negative for gait problem, neck pain and neck  stiffness.  Skin: Negative for itching and rash.  Neurological: Negative for dizziness, extremity weakness, gait problem, headaches, light-headedness and seizures.  Hematological: Negative for adenopathy. Does not bruise/bleed easily.  Psychiatric/Behavioral: Negative for confusion, depression and sleep disturbance. The patient is not nervous/anxious.     PHYSICAL EXAMINATION:  Blood pressure 129/83, pulse 81, temperature 98 F (36.7 C), temperature source Temporal, resp. rate 18, weight 148 lb 6.4 oz (67.3 kg), last menstrual period 06/09/1998, SpO2 100 %.  ECOG PERFORMANCE STATUS: 1 - Symptomatic but completely ambulatory  Physical Exam  Constitutional: Oriented to person, place, and time and well-developed, well-nourished, and in no distress.  HENT:  Head: Normocephalic and atraumatic.  Mouth/Throat: Oropharynx is clear and moist. No oropharyngeal exudate.  Eyes: Conjunctivae are normal. Right eye exhibits no discharge. Left eye exhibits no discharge. No scleral icterus.  Neck: Normal range of motion. Neck supple.  Cardiovascular: Normal rate, regular rhythm, normal heart sounds and intact distal pulses.   Pulmonary/Chest: Effort normal and breath sounds normal. No respiratory distress. No wheezes. No rales.  Abdominal: Soft. Bowel sounds are normal. Exhibits no distension and no mass. There is no tenderness.  Musculoskeletal: Bilateral lower extremity edema. Normal range of  motion.  Lymphadenopathy:    No cervical adenopathy.  Neurological: Alert and oriented to person, place, and time. Exhibits normal muscle tone. Gait normal. Coordination normal.  Skin: Skin is warm and dry. No rash noted. Not diaphoretic. No erythema. No pallor.  Psychiatric: Mood, memory and judgment normal.  Vitals reviewed.  LABORATORY DATA: Lab Results  Component Value Date   WBC 5.7 05/09/2019   HGB 10.6 (L) 05/09/2019   HCT 31.7 (L) 05/09/2019   MCV 101.9 (H) 05/09/2019   PLT 261 05/09/2019       Chemistry      Component Value Date/Time   NA 138 04/18/2019 0859   NA 137 06/09/2017 0901   K 3.9 04/18/2019 0859   K 3.9 06/09/2017 0901   CL 105 04/18/2019 0859   CO2 24 04/18/2019 0859   CO2 25 06/09/2017 0901   BUN 24 (H) 04/18/2019 0859   BUN 19.0 06/09/2017 0901   CREATININE 1.14 (H) 04/18/2019 0859   CREATININE 1.2 (H) 06/09/2017 0901   GLU 170 01/23/2016      Component Value Date/Time   CALCIUM 9.7 04/18/2019 0859   CALCIUM 9.9 06/09/2017 0901   ALKPHOS 69 04/18/2019 0859   ALKPHOS 63 06/09/2017 0901   AST 32 04/18/2019 0859   AST 35 (H) 06/09/2017 0901   ALT 14 04/18/2019 0859   ALT 23 06/09/2017 0901   BILITOT 0.5 04/18/2019 0859   BILITOT 0.65 06/09/2017 0901       RADIOGRAPHIC STUDIES:  No results found.   ASSESSMENT/PLAN:  This is a very pleasant 73 year old Hispanic female diagnosed with metastatic non-small cell lung cancer, adenocarcinoma. She presented with a right lower lobe lung nodule in addition to mediastinal lymphadenopathy and metastatic bone lesions. She was diagnosed in March 2017. Her PDL 1 expression is <1% and she has no actionable mutations.  She previously underwent inductiontreatmentwith systemic chemotherapy withCarboplatin and Alimta. She is currently undergoing maintenance therapy withAlimta. She is status post 54cycles. The patient has been tolerating this treatment fairly well without any concerning adverse side effects.  Labs were reviewed. I recommend that she proceed with cycle #55 today as scheduled.   We will see her back for a follow up visit in 3 weeks for evaluation before starting cycle #56.   I have sent a refill of folic acid 1 mg p.o. daily to her pharmacy.   She will continue to follow with Dr. Posey Pronto, her neurologist for her back pain.   The patient was advised to call immediately if she has any concerning symptoms in the interval. The patient voices understanding of current disease status and treatment  options and is in agreement with the current care plan. All questions were answered. The patient knows to call the clinic with any problems, questions or concerns. We can certainly see the patient much sooner if necessary   No orders of the defined types were placed in this encounter.    Panda Crossin L Raquel Racey, PA-C 05/09/19

## 2019-05-09 ENCOUNTER — Inpatient Hospital Stay: Payer: Medicare Other | Attending: Internal Medicine | Admitting: Physician Assistant

## 2019-05-09 ENCOUNTER — Inpatient Hospital Stay: Payer: Medicare Other

## 2019-05-09 ENCOUNTER — Other Ambulatory Visit: Payer: Self-pay

## 2019-05-09 DIAGNOSIS — E7800 Pure hypercholesterolemia, unspecified: Secondary | ICD-10-CM | POA: Diagnosis not present

## 2019-05-09 DIAGNOSIS — Z5111 Encounter for antineoplastic chemotherapy: Secondary | ICD-10-CM | POA: Diagnosis not present

## 2019-05-09 DIAGNOSIS — Z791 Long term (current) use of non-steroidal anti-inflammatories (NSAID): Secondary | ICD-10-CM | POA: Insufficient documentation

## 2019-05-09 DIAGNOSIS — C3491 Malignant neoplasm of unspecified part of right bronchus or lung: Secondary | ICD-10-CM

## 2019-05-09 DIAGNOSIS — C3431 Malignant neoplasm of lower lobe, right bronchus or lung: Secondary | ICD-10-CM | POA: Insufficient documentation

## 2019-05-09 DIAGNOSIS — M8580 Other specified disorders of bone density and structure, unspecified site: Secondary | ICD-10-CM | POA: Diagnosis not present

## 2019-05-09 DIAGNOSIS — C7951 Secondary malignant neoplasm of bone: Secondary | ICD-10-CM | POA: Diagnosis present

## 2019-05-09 DIAGNOSIS — Z79899 Other long term (current) drug therapy: Secondary | ICD-10-CM | POA: Insufficient documentation

## 2019-05-09 DIAGNOSIS — C3490 Malignant neoplasm of unspecified part of unspecified bronchus or lung: Secondary | ICD-10-CM | POA: Diagnosis not present

## 2019-05-09 DIAGNOSIS — I10 Essential (primary) hypertension: Secondary | ICD-10-CM | POA: Diagnosis not present

## 2019-05-09 LAB — CMP (CANCER CENTER ONLY)
ALT: 17 U/L (ref 0–44)
AST: 37 U/L (ref 15–41)
Albumin: 3.5 g/dL (ref 3.5–5.0)
Alkaline Phosphatase: 75 U/L (ref 38–126)
Anion gap: 10 (ref 5–15)
BUN: 20 mg/dL (ref 8–23)
CO2: 26 mmol/L (ref 22–32)
Calcium: 9.7 mg/dL (ref 8.9–10.3)
Chloride: 105 mmol/L (ref 98–111)
Creatinine: 1.13 mg/dL — ABNORMAL HIGH (ref 0.44–1.00)
GFR, Est AFR Am: 57 mL/min — ABNORMAL LOW (ref >60)
GFR, Estimated: 49 mL/min — ABNORMAL LOW (ref >60)
Glucose, Bld: 103 mg/dL — ABNORMAL HIGH (ref 70–99)
Potassium: 3.8 mmol/L (ref 3.5–5.1)
Sodium: 141 mmol/L (ref 135–145)
Total Bilirubin: 0.5 mg/dL (ref 0.3–1.2)
Total Protein: 7 g/dL (ref 6.5–8.1)

## 2019-05-09 LAB — CBC WITH DIFFERENTIAL (CANCER CENTER ONLY)
Abs Immature Granulocytes: 0.02 10*3/uL (ref 0.00–0.07)
Basophils Absolute: 0 10*3/uL (ref 0.0–0.1)
Basophils Relative: 0 %
Eosinophils Absolute: 0 10*3/uL (ref 0.0–0.5)
Eosinophils Relative: 0 %
HCT: 31.7 % — ABNORMAL LOW (ref 36.0–46.0)
Hemoglobin: 10.6 g/dL — ABNORMAL LOW (ref 12.0–15.0)
Immature Granulocytes: 0 %
Lymphocytes Relative: 12 %
Lymphs Abs: 0.7 10*3/uL (ref 0.7–4.0)
MCH: 34.1 pg — ABNORMAL HIGH (ref 26.0–34.0)
MCHC: 33.4 g/dL (ref 30.0–36.0)
MCV: 101.9 fL — ABNORMAL HIGH (ref 80.0–100.0)
Monocytes Absolute: 0.6 10*3/uL (ref 0.1–1.0)
Monocytes Relative: 11 %
Neutro Abs: 4.4 10*3/uL (ref 1.7–7.7)
Neutrophils Relative %: 77 %
Platelet Count: 261 10*3/uL (ref 150–400)
RBC: 3.11 MIL/uL — ABNORMAL LOW (ref 3.87–5.11)
RDW: 14.5 % (ref 11.5–15.5)
WBC Count: 5.7 10*3/uL (ref 4.0–10.5)
nRBC: 0 % (ref 0.0–0.2)

## 2019-05-09 MED ORDER — PROCHLORPERAZINE MALEATE 10 MG PO TABS
10.0000 mg | ORAL_TABLET | Freq: Once | ORAL | Status: AC
  Administered 2019-05-09: 10 mg via ORAL

## 2019-05-09 MED ORDER — PROCHLORPERAZINE MALEATE 10 MG PO TABS
ORAL_TABLET | ORAL | Status: AC
  Filled 2019-05-09: qty 1

## 2019-05-09 MED ORDER — HEPARIN SOD (PORK) LOCK FLUSH 100 UNIT/ML IV SOLN
500.0000 [IU] | Freq: Once | INTRAVENOUS | Status: AC | PRN
  Administered 2019-05-09: 500 [IU]
  Filled 2019-05-09: qty 5

## 2019-05-09 MED ORDER — FOLIC ACID 1 MG PO TABS: 1.0000 mg | ORAL_TABLET | Freq: Every day | ORAL | 0 refills | Status: DC

## 2019-05-09 MED ORDER — CYANOCOBALAMIN 1000 MCG/ML IJ SOLN
1000.0000 ug | Freq: Once | INTRAMUSCULAR | Status: AC
  Administered 2019-05-09: 1000 ug via INTRAMUSCULAR

## 2019-05-09 MED ORDER — SODIUM CHLORIDE 0.9% FLUSH
10.0000 mL | INTRAVENOUS | Status: DC | PRN
  Administered 2019-05-09: 10 mL
  Filled 2019-05-09: qty 10

## 2019-05-09 MED ORDER — SODIUM CHLORIDE 0.9 % IV SOLN
480.0000 mg/m2 | Freq: Once | INTRAVENOUS | Status: AC
  Administered 2019-05-09: 900 mg via INTRAVENOUS
  Filled 2019-05-09: qty 16

## 2019-05-09 MED ORDER — CYANOCOBALAMIN 1000 MCG/ML IJ SOLN
INTRAMUSCULAR | Status: AC
  Filled 2019-05-09: qty 1

## 2019-05-09 MED ORDER — SODIUM CHLORIDE 0.9 % IV SOLN
Freq: Once | INTRAVENOUS | Status: AC
  Administered 2019-05-09: 11:00:00 via INTRAVENOUS
  Filled 2019-05-09: qty 250

## 2019-05-09 NOTE — Patient Instructions (Signed)
[facility] Discharge Instructions for Patients Receiving Chemotherapy  Today you received the following chemotherapy agents: Pemetrexed (Alimta)  To help prevent nausea and vomiting after your treatment, we encourage you to take your nausea medication as directed.    If you develop nausea and vomiting that is not controlled by your nausea medication, call the clinic.   BELOW ARE SYMPTOMS THAT SHOULD BE REPORTED IMMEDIATELY:  *FEVER GREATER THAN 100.5 F  *CHILLS WITH OR WITHOUT FEVER  NAUSEA AND VOMITING THAT IS NOT CONTROLLED WITH YOUR NAUSEA MEDICATION  *UNUSUAL SHORTNESS OF BREATH  *UNUSUAL BRUISING OR BLEEDING  TENDERNESS IN MOUTH AND THROAT WITH OR WITHOUT PRESENCE OF ULCERS  *URINARY PROBLEMS  *BOWEL PROBLEMS  UNUSUAL RASH Items with * indicate a potential emergency and should be followed up as soon as possible.  Feel free to call the clinic should you have any questions or concerns. The clinic phone number is [phone].  Please show the [place] at check-in to the Emergency Department and triage nurse.  Zoledronic Acid injection (Hypercalcemia, Oncology) O que  este medicamento? O CIDO ZOLEDRNICO diminui a quantidade da perda de [employer].  usado para tratar o excesso de clcio no sangue provocado pelo cncer. Tambm  usado para prevenir as complicaes de cnceres que tenham se espalhado para os ossos. Este medicamento pode ser usado para outros propsitos; em caso de dvidas, pergunte ao seu profissional de sade ou farmacutico. NOMES DE MARCAS COMUNS: Zometa O que devo dizer a meu profissional de sade antes de tomar este medicamento? Precisam saber se voc tem algum dos seguintes problemas ou estados de sade:  asma induzida por aspirina  cncer, principalmente se estiver tomando medicamentos usados para o tratamento de cncer  doenas dentrias ou uso de dentaduras  infeco  doenas renais  recebendo corticoides,  como dexametasona ou prednisona  reao estranha ou alergia ao cido zoledrnico  reao estranha ou alergia a outros medicamentos  reao estranha ou alergia a alimentos, corantes ou conservantes  est grvida ou tentando engravidar  est amamentando Como devo usar este medicamento? Este medicamento  para infuso intravenosa. Este medicamento  administrado por um profissional da sade no hospital ou em consultrio. Fale com seu pediatra a respeito do uso deste medicamento em crianas. Pode ser preciso tomar alguns cuidados especiais. Superdosagem: Se achar que tomou uma superdosagem deste medicamento, entre em contato imediatamente com o [facility] ou v a [employer]. OBSERVAO: Este medicamento  s para voc. No compartilhe este medicamento com outras pessoas. E se eu deixar de tomar uma dose?  importante que voc no perca nenhuma dose. Se no puder comparecer a uma consulta, entre em contato com seu mdico ou profissional de sade. O que pode interagir com este medicamento?  alguns antibiticos aplicados como injeo  AINEs, medicamentos analgsicos e anti-inflamatrios, como ibuprofeno, naproxeno  alguns diurticos, como bumetanida, furosemida  teriparatida  talidomida Esta lista pode no descrever todas as interaes possveis. D ao seu profissional de sade uma lista de todos os medicamentos, ervas medicinais, remdios de venda livre, ou suplementos alimentares que voc [place]. Diga tambm se voc fuma, bebe, ou [place] drogas ilcitas. Alguns destes podem interagir com o seu medicamento. Ao que devo ficar atento quando estiver [employer] medicamento? Consulte seu mdico ou profissional de sade para acompanhamento regular Museum/gallery curator. Os benefcios deste medicamento podem demorar algum tempo para aparecer. No pare de usar [employer] a menos que seu mdico [place]. Seu  mdico pode pedir exames de sangue e outros exames para monitorar a sua  evoluo. Mulheres que queiram engravidar ou que acreditem que possam estar grvidas devem avisar aos seus mdicos. H risco de graves efeitos colaterais no feto. Para mais informaes, fale com seu mdico, profissional de sade ou farmacutico. Voc deve consumir clcio e vitamina D em quantidade suficiente enquanto estiver [employer]. [place]verse com seu mdico ou profissional de sade a respeito dos alimentos que consome e das vitaminas que toma. Algumas pessoas que tomam este medicamento sofrem fortes dores nos ossos, articulaes ou msculos. Este medicamento tambm pode aumentar os riscos de problemas no maxilar ou de uma fratura do fmur. Informe o seu mdico imediatamente se sentir dores fortes no maxilar, ossos, articulaes ou msculos. Avise seu mdico se tiver uma dor que no passa ou que piora. Informe ao seu cirurgio-dentista que est tomando este medicamento. Voc no deve passar por nenhuma cirurgia dental de grande porte enquanto tomar [employer]. Consulte o seu dentista para fazer uma avaliao odontolgica e corrigir quaisquer problemas dentrios antes de comear a Systems developer. Mantenha uma boa higiene oral enquanto tomar [employer]. Assegure-se de que far consultas de acompanhamento odontolgico regularmente. Que efeitos colaterais posso sentir aps usar este medicamento? Efeitos colaterais que devem ser informados ao seu mdico ou profissional de sade o mais rpido possvel:  reaes alrgicas, como erupo na pele, coceira, urticria, ou inchao do rosto, dos lbios ou da lngua  [place], confuso ou depresso  dificuldade para respirar  alteraes na viso  dor nos olhos  sensao de [place], [place], quedas  dor na mandbula ou maxilar, principalmente depois de um procedimento odontolgico  aftas  cibras, rigidez ou fraqueza muscular  vermelhido, bolhas, descamao ou afrouxamento da pele, inclusive dentro da  boca  dificuldade para urinar ou alterao na quantidade de urina Efeitos colaterais que normalmente no precisam de cuidados mdicos (avise ao seu mdico ou profissional de sade se persistirem ou forem incmodos):  dor nos ossos, articulaes ou msculos  priso de ventre  diarreia  febre  queda de cabelo  irritao no local da injeo  perda de apetite  enjoo ou vmitos  desarranjo estomacal  dificuldade para dormir  dificuldade para engolir  fraqueza ou cansao Esta lista pode no descrever todos os efeitos colaterais possveis. Para mais orientaes sobre efeitos colaterais, consulte o seu mdico. Voc pode relatar a ocorrncia de efeitos colaterais  FDA pelo telefone [phone]. Onde devo guardar meu medicamento? Este medicamento  administrado por um profissional da sade no hospital ou em consultrio. Voc no receber este medicamento para conservao em casa. OBSERVAO: Este folheto  um resumo. Pode no cobrir todas as informaes possveis. Se tiver dvidas a respeito deste medicamento, fale com seu mdico, farmacutico ou profissional de sade.  2020 Elsevier/Gold Standard (2016-06-24 00:00:00)  Coronavirus (COVID-19) Are you at risk?  Are you at risk for the Coronavirus (COVID-19)?  To be considered HIGH RISK for Coronavirus (COVID-19), you have to meet the following criteria:  . Traveled to [place], [place], [place], [place] or [place]; or in the [place] to [place], [place], [place], or [place]; and have fever, cough, and shortness of breath within the last 2 weeks of travel OR . Been in close contact with a person diagnosed with COVID-19 within the last 2 weeks and have fever, cough, and shortness of breath . IF YOU DO NOT MEET THESE CRITERIA, YOU ARE CONSIDERED LOW RISK FOR COVID-19.  What to do if  you are HIGH RISK for COVID-19?  Marland Kitchen If you are having a medical emergency, call 911. . Seek medical care right away. Before you go to a  doctor's office, urgent care or emergency department, call ahead and tell them about your recent travel, contact with someone diagnosed with COVID-19, and your symptoms. You should receive instructions from your physician's office regarding next steps of care.  . When you arrive at healthcare provider, tell the healthcare staff immediately you have returned from visiting [place], [place], [place], [place] or [place]; or traveled in the [place] to [place], [place], [place], or [place]; in the last two weeks or you have been in close contact with a person diagnosed with COVID-19 in the last 2 weeks.   . Tell the health care staff about your symptoms: fever, cough and shortness of breath. . After you have been seen by a medical provider, you will be either: o Tested for (COVID-19) and discharged home on quarantine except to seek medical care if symptoms worsen, and asked to  - Stay home and avoid contact with others until you get your results (4-5 days)  - Avoid travel on public transportation if possible (such as bus, train, or airplane) or o Sent to the Emergency Department by EMS for evaluation, COVID-19 testing, and possible admission depending on your condition and test results.  What to do if you are LOW RISK for COVID-19?  Reduce your risk of any infection by using the same precautions used for avoiding the common cold or flu:  Marland Kitchen Wash your hands often with soap and warm water for at least 20 seconds.  If soap and water are not readily available, use an alcohol-based hand sanitizer with at least 60% alcohol.  . If coughing or sneezing, cover your mouth and nose by coughing or sneezing into the elbow areas of your shirt or coat, into a tissue or into your sleeve (not your hands). . Avoid shaking hands with others and consider head nods or verbal greetings only. . Avoid touching your eyes, nose, or mouth with unwashed hands.  . Avoid close contact with people who are sick. . Avoid  places or events with large numbers of people in one location, like concerts or sporting events. . Carefully consider travel plans you have or are making. . If you are planning any travel outside or inside the [place], visit the CDC's Travelers' Health webpage for the latest health notices. . If you have some symptoms but not all symptoms, continue to monitor at home and seek medical attention if your symptoms worsen. . If you are having a medical emergency, call 911.   [place] / e-Visit: [email or web address]         [facility]: [facility]: [phone]                   [facility]: [phone]

## 2019-05-09 NOTE — Patient Instructions (Signed)

## 2019-05-10 ENCOUNTER — Telehealth: Payer: Self-pay | Admitting: Internal Medicine

## 2019-05-10 NOTE — Telephone Encounter (Signed)
Scheduled per los. Called and left msg. Mailed printout  °

## 2019-05-16 ENCOUNTER — Telehealth: Payer: Self-pay | Admitting: Internal Medicine

## 2019-05-16 DIAGNOSIS — M5416 Radiculopathy, lumbar region: Secondary | ICD-10-CM

## 2019-05-16 MED ORDER — PREGABALIN 75 MG PO CAPS: ORAL_CAPSULE | ORAL | 3 refills | Status: DC

## 2019-05-16 MED ORDER — TETANUS-DIPHTH-ACELL PERTUSSIS 5-2.5-18.5 LF-MCG/0.5 IM SUSP: 0.5000 mL | Freq: Once | INTRAMUSCULAR | 0 refills | Status: AC

## 2019-05-16 NOTE — Telephone Encounter (Signed)
I have sent the Tdap but unable to send Lyrica (controlled).

## 2019-05-16 NOTE — Telephone Encounter (Signed)
Sent!

## 2019-05-16 NOTE — Telephone Encounter (Signed)
Patient lost prescription for Tdap and Lyrica (prescribed at the last visit), please resend

## 2019-05-31 ENCOUNTER — Inpatient Hospital Stay: Payer: Medicare Other

## 2019-05-31 ENCOUNTER — Other Ambulatory Visit: Payer: Self-pay

## 2019-05-31 ENCOUNTER — Encounter: Payer: Self-pay | Admitting: Internal Medicine

## 2019-05-31 ENCOUNTER — Inpatient Hospital Stay (HOSPITAL_BASED_OUTPATIENT_CLINIC_OR_DEPARTMENT_OTHER): Payer: Medicare Other | Admitting: Internal Medicine

## 2019-05-31 VITALS — BP 134/95 | HR 83 | Temp 98.3°F | Resp 18 | Ht 63.0 in | Wt 147.5 lb

## 2019-05-31 DIAGNOSIS — C7951 Secondary malignant neoplasm of bone: Secondary | ICD-10-CM

## 2019-05-31 DIAGNOSIS — T451X5A Adverse effect of antineoplastic and immunosuppressive drugs, initial encounter: Secondary | ICD-10-CM

## 2019-05-31 DIAGNOSIS — Z95828 Presence of other vascular implants and grafts: Secondary | ICD-10-CM

## 2019-05-31 DIAGNOSIS — C3491 Malignant neoplasm of unspecified part of right bronchus or lung: Secondary | ICD-10-CM

## 2019-05-31 DIAGNOSIS — Z5111 Encounter for antineoplastic chemotherapy: Secondary | ICD-10-CM

## 2019-05-31 DIAGNOSIS — D6481 Anemia due to antineoplastic chemotherapy: Secondary | ICD-10-CM

## 2019-05-31 DIAGNOSIS — C3490 Malignant neoplasm of unspecified part of unspecified bronchus or lung: Secondary | ICD-10-CM

## 2019-05-31 LAB — CMP (CANCER CENTER ONLY)
ALT: 16 U/L (ref 0–44)
AST: 34 U/L (ref 15–41)
Albumin: 3.5 g/dL (ref 3.5–5.0)
Alkaline Phosphatase: 63 U/L (ref 38–126)
Anion gap: 9 (ref 5–15)
BUN: 23 mg/dL (ref 8–23)
CO2: 25 mmol/L (ref 22–32)
Calcium: 9.3 mg/dL (ref 8.9–10.3)
Chloride: 105 mmol/L (ref 98–111)
Creatinine: 1.09 mg/dL — ABNORMAL HIGH (ref 0.44–1.00)
GFR, Est AFR Am: 59 mL/min — ABNORMAL LOW (ref >60)
GFR, Estimated: 51 mL/min — ABNORMAL LOW (ref >60)
Glucose, Bld: 111 mg/dL — ABNORMAL HIGH (ref 70–99)
Potassium: 4 mmol/L (ref 3.5–5.1)
Sodium: 139 mmol/L (ref 135–145)
Total Bilirubin: 0.6 mg/dL (ref 0.3–1.2)
Total Protein: 7 g/dL (ref 6.5–8.1)

## 2019-05-31 LAB — CBC WITH DIFFERENTIAL (CANCER CENTER ONLY)
Abs Immature Granulocytes: 0.01 10*3/uL (ref 0.00–0.07)
Basophils Absolute: 0 10*3/uL (ref 0.0–0.1)
Basophils Relative: 0 %
Eosinophils Absolute: 0 10*3/uL (ref 0.0–0.5)
Eosinophils Relative: 0 %
HCT: 31 % — ABNORMAL LOW (ref 36.0–46.0)
Hemoglobin: 10.4 g/dL — ABNORMAL LOW (ref 12.0–15.0)
Immature Granulocytes: 0 %
Lymphocytes Relative: 15 %
Lymphs Abs: 0.8 10*3/uL (ref 0.7–4.0)
MCH: 34.1 pg — ABNORMAL HIGH (ref 26.0–34.0)
MCHC: 33.5 g/dL (ref 30.0–36.0)
MCV: 101.6 fL — ABNORMAL HIGH (ref 80.0–100.0)
Monocytes Absolute: 0.6 10*3/uL (ref 0.1–1.0)
Monocytes Relative: 10 %
Neutro Abs: 4.1 10*3/uL (ref 1.7–7.7)
Neutrophils Relative %: 75 %
Platelet Count: 259 10*3/uL (ref 150–400)
RBC: 3.05 MIL/uL — ABNORMAL LOW (ref 3.87–5.11)
RDW: 14.6 % (ref 11.5–15.5)
WBC Count: 5.5 10*3/uL (ref 4.0–10.5)
nRBC: 0 % (ref 0.0–0.2)

## 2019-05-31 MED ORDER — SODIUM CHLORIDE 0.9% FLUSH
10.0000 mL | INTRAVENOUS | Status: DC | PRN
  Administered 2019-05-31: 10 mL
  Filled 2019-05-31: qty 10

## 2019-05-31 MED ORDER — CYANOCOBALAMIN 1000 MCG/ML IJ SOLN
INTRAMUSCULAR | Status: AC
  Filled 2019-05-31: qty 1

## 2019-05-31 MED ORDER — PROCHLORPERAZINE MALEATE 10 MG PO TABS
10.0000 mg | ORAL_TABLET | Freq: Once | ORAL | Status: AC
  Administered 2019-05-31: 10 mg via ORAL

## 2019-05-31 MED ORDER — PROCHLORPERAZINE MALEATE 10 MG PO TABS
ORAL_TABLET | ORAL | Status: AC
  Filled 2019-05-31: qty 1

## 2019-05-31 MED ORDER — CYANOCOBALAMIN 1000 MCG/ML IJ SOLN: 1000.0000 ug | Freq: Once | INTRAMUSCULAR | Status: DC

## 2019-05-31 MED ORDER — SODIUM CHLORIDE 0.9 % IV SOLN
Freq: Once | INTRAVENOUS | Status: AC
  Filled 2019-05-31: qty 250

## 2019-05-31 MED ORDER — SODIUM CHLORIDE 0.9 % IV SOLN
480.0000 mg/m2 | Freq: Once | INTRAVENOUS | Status: AC
  Administered 2019-05-31: 12:00:00 900 mg via INTRAVENOUS
  Filled 2019-05-31: qty 20

## 2019-05-31 MED ORDER — SODIUM CHLORIDE 0.9% FLUSH
10.0000 mL | Freq: Once | INTRAVENOUS | Status: DC
  Filled 2019-05-31: qty 10

## 2019-05-31 MED ORDER — HEPARIN SOD (PORK) LOCK FLUSH 100 UNIT/ML IV SOLN
500.0000 [IU] | Freq: Once | INTRAVENOUS | Status: AC | PRN
  Administered 2019-05-31: 500 [IU]
  Filled 2019-05-31: qty 5

## 2019-05-31 NOTE — Patient Instructions (Signed)
[facility] Discharge Instructions for Patients Receiving Chemotherapy  Today you received the following chemotherapy agents: Pemetrexed (Alimta)  To help prevent nausea and vomiting after your treatment, we encourage you to take your nausea medication as directed.    If you develop nausea and vomiting that is not controlled by your nausea medication, call the clinic.   BELOW ARE SYMPTOMS THAT SHOULD BE REPORTED IMMEDIATELY:  *FEVER GREATER THAN 100.5 F  *CHILLS WITH OR WITHOUT FEVER  NAUSEA AND VOMITING THAT IS NOT CONTROLLED WITH YOUR NAUSEA MEDICATION  *UNUSUAL SHORTNESS OF BREATH  *UNUSUAL BRUISING OR BLEEDING  TENDERNESS IN MOUTH AND THROAT WITH OR WITHOUT PRESENCE OF ULCERS  *URINARY PROBLEMS  *BOWEL PROBLEMS  UNUSUAL RASH Items with * indicate a potential emergency and should be followed up as soon as possible.  Feel free to call the clinic should you have any questions or concerns. The clinic phone number is [phone].  Please show the [place] at check-in to the Emergency Department and triage nurse.  Zoledronic Acid injection (Hypercalcemia, Oncology) O que  este medicamento? O CIDO ZOLEDRNICO diminui a quantidade da perda de [employer].  usado para tratar o excesso de clcio no sangue provocado pelo cncer. Tambm  usado para prevenir as complicaes de cnceres que tenham se espalhado para os ossos. Este medicamento pode ser usado para outros propsitos; em caso de dvidas, pergunte ao seu profissional de sade ou farmacutico. NOMES DE MARCAS COMUNS: Zometa O que devo dizer a meu profissional de sade antes de tomar este medicamento? Precisam saber se voc tem algum dos seguintes problemas ou estados de sade:  asma induzida por aspirina  cncer, principalmente se estiver tomando medicamentos usados para o tratamento de cncer  doenas dentrias ou uso de dentaduras  infeco  doenas renais  recebendo corticoides,  como dexametasona ou prednisona  reao estranha ou alergia ao cido zoledrnico  reao estranha ou alergia a outros medicamentos  reao estranha ou alergia a alimentos, corantes ou conservantes  est grvida ou tentando engravidar  est amamentando Como devo usar este medicamento? Este medicamento  para infuso intravenosa. Este medicamento  administrado por um profissional da sade no hospital ou em consultrio. Fale com seu pediatra a respeito do uso deste medicamento em crianas. Pode ser preciso tomar alguns cuidados especiais. Superdosagem: Se achar que tomou uma superdosagem deste medicamento, entre em contato imediatamente com o [facility] ou v a [employer]. OBSERVAO: Este medicamento  s para voc. No compartilhe este medicamento com outras pessoas. E se eu deixar de tomar uma dose?  importante que voc no perca nenhuma dose. Se no puder comparecer a uma consulta, entre em contato com seu mdico ou profissional de sade. O que pode interagir com este medicamento?  alguns antibiticos aplicados como injeo  AINEs, medicamentos analgsicos e anti-inflamatrios, como ibuprofeno, naproxeno  alguns diurticos, como bumetanida, furosemida  teriparatida  talidomida Esta lista pode no descrever todas as interaes possveis. D ao seu profissional de sade uma lista de todos os medicamentos, ervas medicinais, remdios de venda livre, ou suplementos alimentares que voc [place]. Diga tambm se voc fuma, bebe, ou [place] drogas ilcitas. Alguns destes podem interagir com o seu medicamento. Ao que devo ficar atento quando estiver [employer] medicamento? Consulte seu mdico ou profissional de sade para acompanhamento regular Museum/gallery curator. Os benefcios deste medicamento podem demorar algum tempo para aparecer. No pare de usar [employer] a menos que seu mdico [place]. Seu  mdico pode pedir exames de sangue e outros exames para monitorar a sua  evoluo. [place] que queiram engravidar ou que acreditem que possam estar grvidas devem avisar aos seus mdicos. H risco de graves efeitos colaterais no feto. Para mais informaes, fale com seu mdico, profissional de sade ou farmacutico. Voc deve consumir clcio e vitamina D em quantidade suficiente enquanto estiver [employer]. [place] com seu mdico ou profissional de sade a respeito dos alimentos que consome e das vitaminas que toma. Algumas pessoas que tomam este medicamento sofrem fortes dores nos ossos, articulaes ou msculos. Este medicamento tambm pode aumentar os riscos de problemas no maxilar ou de uma fratura do fmur. Informe o seu mdico imediatamente se sentir dores fortes no maxilar, ossos, articulaes ou msculos. Avise seu mdico se tiver uma dor que no passa ou que piora. Informe ao seu cirurgio-dentista que est tomando este medicamento. Voc no deve passar por nenhuma cirurgia dental de grande porte enquanto tomar [employer]. Consulte o seu dentista para fazer uma avaliao odontolgica e corrigir quaisquer problemas dentrios antes de comear a Systems developer. Mantenha uma boa higiene oral enquanto tomar [employer]. Assegure-se de que far consultas de acompanhamento odontolgico regularmente. Que efeitos colaterais posso sentir aps usar este medicamento? Efeitos colaterais que devem ser informados ao seu mdico ou profissional de sade o mais rpido possvel:  reaes alrgicas, como erupo na pele, coceira, urticria, ou inchao do rosto, dos lbios ou da lngua  [place], confuso ou depresso  dificuldade para respirar  alteraes na viso  dor nos olhos  sensao de [place], [place], quedas  dor na mandbula ou maxilar, principalmente depois de um procedimento odontolgico  aftas  cibras, rigidez ou fraqueza muscular  vermelhido, bolhas, descamao ou afrouxamento da pele, inclusive dentro da  boca  dificuldade para urinar ou alterao na quantidade de urina Efeitos colaterais que normalmente no precisam de cuidados mdicos (avise ao seu mdico ou profissional de sade se persistirem ou forem incmodos):  dor nos ossos, articulaes ou msculos  priso de ventre  diarreia  febre  queda de cabelo  irritao no local da injeo  perda de apetite  enjoo ou vmitos  desarranjo estomacal  dificuldade para dormir  dificuldade para engolir  fraqueza ou cansao Esta lista pode no descrever todos os efeitos colaterais possveis. Para mais orientaes sobre efeitos colaterais, consulte o seu mdico. Voc pode relatar a ocorrncia de efeitos colaterais  FDA pelo telefone [phone]. Onde devo guardar meu medicamento? Este medicamento  administrado por um profissional da sade no hospital ou em consultrio. Voc no receber este medicamento para conservao em casa. OBSERVAO: Este folheto  um resumo. Pode no cobrir todas as informaes possveis. Se tiver dvidas a respeito deste medicamento, fale com seu mdico, farmacutico ou profissional de sade.  2020 Elsevier/Gold Standard (2016-06-24 00:00:00)  Coronavirus (COVID-19) Are you at risk?  Are you at risk for the Coronavirus (COVID-19)?  To be considered HIGH RISK for Coronavirus (COVID-19), you have to meet the following criteria:  . Traveled to [place], [place], [place], [place] or [place]; or in the [place] to [place], [place], [place], or [place]; and have fever, cough, and shortness of breath within the last 2 weeks of travel OR . Been in close contact with a person diagnosed with COVID-19 within the last 2 weeks and have fever, cough, and shortness of breath . IF YOU DO NOT MEET THESE CRITERIA, YOU ARE CONSIDERED LOW RISK FOR COVID-19.  What to do if  you are HIGH RISK for COVID-19?  Marland Kitchen If you are having a medical emergency, call 911. . Seek medical care right away. Before you go to a  doctor's office, urgent care or emergency department, call ahead and tell them about your recent travel, contact with someone diagnosed with COVID-19, and your symptoms. You should receive instructions from your physician's office regarding next steps of care.  . When you arrive at healthcare provider, tell the healthcare staff immediately you have returned from visiting [place], [place], [place], [place] or [place]; or traveled in the [place] to [place], [place], [place], or [place]; in the last two weeks or you have been in close contact with a person diagnosed with COVID-19 in the last 2 weeks.   . Tell the health care staff about your symptoms: fever, cough and shortness of breath. . After you have been seen by a medical provider, you will be either: o Tested for (COVID-19) and discharged home on quarantine except to seek medical care if symptoms worsen, and asked to  - Stay home and avoid contact with others until you get your results (4-5 days)  - Avoid travel on public transportation if possible (such as bus, train, or airplane) or o Sent to the Emergency Department by EMS for evaluation, COVID-19 testing, and possible admission depending on your condition and test results.  What to do if you are LOW RISK for COVID-19?  Reduce your risk of any infection by using the same precautions used for avoiding the common cold or flu:  Marland Kitchen Wash your hands often with soap and warm water for at least 20 seconds.  If soap and water are not readily available, use an alcohol-based hand sanitizer with at least 60% alcohol.  . If coughing or sneezing, cover your mouth and nose by coughing or sneezing into the elbow areas of your shirt or coat, into a tissue or into your sleeve (not your hands). . Avoid shaking hands with others and consider head nods or verbal greetings only. . Avoid touching your eyes, nose, or mouth with unwashed hands.  . Avoid close contact with people who are sick. . Avoid  places or events with large numbers of people in one location, like concerts or sporting events. . Carefully consider travel plans you have or are making. . If you are planning any travel outside or inside the [place], visit the CDC's Travelers' Health webpage for the latest health notices. . If you have some symptoms but not all symptoms, continue to monitor at home and seek medical attention if your symptoms worsen. . If you are having a medical emergency, call 911.   [place] / e-Visit: [email or web address]         [facility]: [facility]: [phone]                   [facility]: [phone]

## 2019-05-31 NOTE — Progress Notes (Signed)
[place] [phone]   Fax:([phone]) [place], MD [place] [address] [place] [address]  DIAGNOSIS: Stage IV (T1b, N2, M1b) non-small cell lung cancer, adenocarcinoma diagnosed in March 2017 and presented with right lower lobe lung nodule in addition to mediastinal lymphadenopathy and metastatic bone lesions.  Molecular studies: PDL 1 TPS  <1%.   Foundation One Studies: Positive for ERBB2 [id number]. Negative for EGFR, KRAS, ALK, BRAF, MET, RET and ROS1.  PRIOR THERAPY:  1) Induction systemic chemotherapy with carboplatin for AUC of 5 and Alimta 500 MG/M2 every 3 weeks is status post 6 cycles at the [facility], last dose was given 03/05/2016 with stable disease. 2) palliative radiation to the metastatic bone disease in the lower back and pelvic area.  CURRENT THERAPY::  1)  Maintenance systemic chemotherapy with Alimta 500 MG/M2 every 3 weeks status post 55 cycles. First dose was given 03/25/2016.  2) Zometa 4 mg IV every 12 week for metastatic bone disease.  INTERVAL HIST Valerie Long 73 y.o. female returns to the clinic today for follow-up visit.  The patient is feeling fine today with no concerning complaints except for the persistent low back pain with radiation to the right leg.  She is currently undergoing acupuncture but she did not notice any significant improvement.  She will continue for a few more rounds.  She denied having any chest pain, shortness of breath, cough or hemoptysis.  She denied having any fever or chills.  She has no nausea, vomiting, diarrhea or constipation.  She has no headache or visual changes.  She continues to tolerate her treatment with maintenance Alimta fairly well.  The patient is here today for evaluation before starting cycle #56.  MEDICAL HISTORY: Past Medical History:  Diagnosis Date  . Adenocarcinoma of right lung, stage 4 ([place]) 2017  . Anemia   .  Arthritis   . Bone metastases ([place]) 02/24/2017  . Encounter for antineoplastic chemotherapy 03/17/2016  . Hypercholesterolemia   . Hypertension 06/18/2016  . Osteopenia   . 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 Pneumonia   . Shortness of breath dyspnea     ALLERGIES:  has No Known Allergies.  MEDICATIONS:  Current Outpatient Medications  Medication Sig Dispense Refill  . acetaminophen (TYLENOL) 500 MG tablet Take 1,000 mg by mouth every 6 (six) hours as needed for moderate pain or headache.    . baclofen (LIORESAL) 10 MG tablet Take 1 tablet (10 mg total) by mouth 3 (three) times daily. (Patient not taking: Reported on 03/28/2019) 30 each 0  . Calcium Carb-Cholecalciferol (CALCIUM 600/VITAMIN D3 PO) Take by mouth.    . Cyanocobalamin (B-12 PO) Take 2 tablets by mouth daily.    Marland Kitchen dexamethasone (DECADRON) 4 MG tablet Take 73m by mouth twice daily the day before, of, and after chemo 40 tablet 1  . Fish Oil-Cholecalciferol (OMEGA-3 + VITAMIN D3 PO) Take 1 tablet by mouth daily.    . folic acid (FOLVITE) 1 MG tablet Take 1 tablet (1 mg total) by mouth daily. 90 tablet 0  . lidocaine-prilocaine (EMLA) cream Apply 1 application topically as needed. Apply 1-2 tsp over port site 1-2 hours prior to chemo. 30 g 0  . meloxicam (MOBIC) 15 MG tablet TAKE 1 TABLET BY MOUTH EVERY DAY AS NEEDED FOR PAIN 30 tablet 0  . ondansetron (ZOFRAN) 8 MG tablet Take  8 mg by mouth every 8 (eight) hours as needed.    Marland Kitchen OVER THE COUNTER MEDICATION Apply 1 drop topically daily as needed. 1 drop CBD oil in vicks vapor rub . Applied to right thigh for pain relief.    . pregabalin (LYRICA) 75 MG capsule 1 tablet daily for 10 days, then 1 tablet twice a day 60 capsule 3  . PRESCRIPTION MEDICATION Inject 1 Dose as directed every 21 ( twenty-one) days. Chemo    . prochlorperazine (COMPAZINE) 10 MG tablet Take 1 tablet (10 mg total) by mouth every 6 (six) hours as needed for nausea or vomiting. 30 tablet 0    No current facility-administered medications for this visit.    SURGICAL HISTORY:  Past Surgical History:  Procedure Laterality Date  . CESAREAN SECTION     myomectomy  . COLONOSCOPY W/ POLYPECTOMY    . IR GENERIC HISTORICAL  08/17/2016   IR US GUIDE VASC ACCESS RIGHT 08/17/2016 [name], MD [facility]  . IR GENERIC HISTORICAL  08/17/2016   IR FLUORO GUIDE PORT INSERTION RIGHT 08/17/2016 [name], MD [facility]  . MEDIASTINOSCOPY N/A 09/25/2015   Procedure: MEDIASTINOSCOPY;  Surgeon: Melrose Nakayama, MD;  Location: [place];  Service: Thoracic;  Laterality: N/A;  . VIDEO BRONCHOSCOPY Bilateral 08/19/2015   Procedure: VIDEO BRONCHOSCOPY WITH FLUORO;  Surgeon: Rigoberto Noel, MD;  Location: [place];  Service: Cardiopulmonary;  Laterality: Bilateral;  . VIDEO BRONCHOSCOPY N/A 09/08/2015   Procedure: VIDEO BRONCHOSCOPY WITH FLUORO;  Surgeon: Rigoberto Noel, MD;  Location: [place];  Service: Thoracic;  Laterality: N/A;  . VIDEO BRONCHOSCOPY WITH ENDOBRONCHIAL ULTRASOUND Right 09/08/2015   Procedure: ATTEMPTED VIDEO BRONCHOSCOPY ENDOBRONCHIAL ULTRASOUND  ;  Surgeon: Rigoberto Noel, MD;  Location: [place];  Service: Thoracic;  Laterality: Right;    REVIEW OF SYSTEMS:  A comprehensive review of systems was negative except for: Constitutional: positive for fatigue Musculoskeletal: positive for back pain   PHYSICAL EXAMINATION: General appearance: alert, cooperative, fatigued and no distress Head: Normocephalic, without obvious abnormality, atraumatic Neck: no adenopathy, no JVD, supple, symmetrical, trachea midline and thyroid not enlarged, symmetric, no tenderness/mass/nodules Lymph nodes: Cervical, supraclavicular, and axillary nodes normal. Resp: clear to auscultation bilaterally Back: symmetric, no curvature. ROM normal. No CVA tenderness. Cardio: regular rate and rhythm, S1, S2 normal, no murmur, click, rub or gallop GI: soft, non-tender; bowel sounds normal; no masses,   no organomegaly Extremities: extremities normal, atraumatic, no cyanosis or edema  ECOG PERFORMANCE STATUS: 1 - Symptomatic but completely ambulatory  Blood pressure (!) 134/95, pulse 83, temperature 98.3 F (36.8 C), temperature source Temporal, resp. rate 18, height _0  (1.6 m), weight 147 lb 8 oz (66.9 kg), last menstrual period 06/09/1998, SpO2 100 %.  LABORATORY DATA: Lab Results  Component Value Date   WBC 5.5 05/31/2019   HGB 10.4 (L) 05/31/2019   HCT 31.0 (L) 05/31/2019   MCV 101.6 (H) 05/31/2019   PLT 259 05/31/2019      Chemistry      Component Value Date/Time   NA 141 05/09/2019 0935   NA 137 06/09/2017 0901   K 3.8 05/09/2019 0935   K 3.9 06/09/2017 0901   CL 105 05/09/2019 0935   CO2 26 05/09/2019 0935   CO2 25 06/09/2017 0901   BUN 20 05/09/2019 0935   BUN 19.0 06/09/2017 0901   CREATININE 1.13 (H) 05/09/2019 0935   CREATININE 1.2 (H) 06/09/2017 0901   GLU 170 01/23/2016 0000  Component Value Date/Time   CALCIUM 9.7 05/09/2019 0935   CALCIUM 9.9 06/09/2017 0901   ALKPHOS 75 05/09/2019 0935   ALKPHOS 63 06/09/2017 0901   AST 37 05/09/2019 0935   AST 35 (H) 06/09/2017 0901   ALT 17 05/09/2019 0935   ALT 23 06/09/2017 0901   BILITOT 0.5 05/09/2019 0935   BILITOT 0.65 06/09/2017 0901       RADIOGRAPHIC STUDIES: No results found.  ASSESSMENT AND PLAN:  This is a very pleasant 73 years old Hispanic female with metastatic non-small cell lung cancer, adenocarcinoma status post induction systemic chemotherapy with carboplatin and Alimta and she is currently on maintenance treatment with single agent Alimta status post 55 cycles. The patient continues to tolerate her treatment well with no concerning adverse effects. I recommended for her to proceed with cycle #56 today as planned. I will see her back for follow-up visit in 3 weeks for evaluation after repeating CT scan of the chest, abdomen pelvis for restaging of her disease. For the back pain she  will continue with her current pain medication. The patient was advised to call immediately if she has any concerning symptoms in the interval. The patient voices understanding of current disease status and treatment options and is in agreement with the current care plan with gabapentin and Mobic. All questions were answered. The patient knows to call the clinic with any problems, questions or concerns. We can certainly see the patient much sooner if necessary.  Disclaimer: This note was dictated with voice recognition software. Similar sounding words can inadvertently be transcribed and may not be corrected upon review.

## 2019-06-05 ENCOUNTER — Telehealth: Payer: Self-pay | Admitting: Internal Medicine

## 2019-06-05 NOTE — Telephone Encounter (Signed)
Scheduled per los. Called and left msg. Mailed printout  °

## 2019-06-19 ENCOUNTER — Ambulatory Visit (HOSPITAL_COMMUNITY)
Admission: RE | Admit: 2019-06-19 | Discharge: 2019-06-19 | Disposition: A | Discharge status: Home / Self Care | Payer: Medicare Other | Source: Ambulatory Visit | Attending: Internal Medicine | Admitting: Internal Medicine

## 2019-06-19 ENCOUNTER — Other Ambulatory Visit: Payer: Self-pay

## 2019-06-19 DIAGNOSIS — K8020 Calculus of gallbladder without cholecystitis without obstruction: Secondary | ICD-10-CM | POA: Diagnosis not present

## 2019-06-19 DIAGNOSIS — C3490 Malignant neoplasm of unspecified part of unspecified bronchus or lung: Secondary | ICD-10-CM | POA: Diagnosis not present

## 2019-06-19 MED ORDER — IOHEXOL 300 MG/ML  SOLN
100.0000 mL | Freq: Once | INTRAMUSCULAR | Status: AC | PRN
  Administered 2019-06-19: 100 mL via INTRAVENOUS

## 2019-06-19 MED ORDER — SODIUM CHLORIDE (PF) 0.9 % IJ SOLN
INTRAMUSCULAR | Status: AC
  Filled 2019-06-19: qty 50

## 2019-06-20 ENCOUNTER — Inpatient Hospital Stay: Payer: Medicare Other | Attending: Internal Medicine

## 2019-06-20 ENCOUNTER — Telehealth: Payer: Self-pay | Admitting: Internal Medicine

## 2019-06-20 ENCOUNTER — Inpatient Hospital Stay: Payer: Medicare Other | Admitting: Internal Medicine

## 2019-06-20 ENCOUNTER — Telehealth: Payer: Self-pay | Admitting: Medical Oncology

## 2019-06-20 ENCOUNTER — Inpatient Hospital Stay: Payer: Medicare Other

## 2019-06-20 DIAGNOSIS — E7800 Pure hypercholesterolemia, unspecified: Secondary | ICD-10-CM | POA: Insufficient documentation

## 2019-06-20 DIAGNOSIS — M545 Low back pain: Secondary | ICD-10-CM | POA: Insufficient documentation

## 2019-06-20 DIAGNOSIS — Z791 Long term (current) use of non-steroidal anti-inflammatories (NSAID): Secondary | ICD-10-CM | POA: Insufficient documentation

## 2019-06-20 DIAGNOSIS — I10 Essential (primary) hypertension: Secondary | ICD-10-CM | POA: Insufficient documentation

## 2019-06-20 DIAGNOSIS — Z79899 Other long term (current) drug therapy: Secondary | ICD-10-CM | POA: Insufficient documentation

## 2019-06-20 DIAGNOSIS — C7951 Secondary malignant neoplasm of bone: Secondary | ICD-10-CM | POA: Insufficient documentation

## 2019-06-20 DIAGNOSIS — C3431 Malignant neoplasm of lower lobe, right bronchus or lung: Secondary | ICD-10-CM | POA: Insufficient documentation

## 2019-06-20 DIAGNOSIS — Z7952 Long term (current) use of systemic steroids: Secondary | ICD-10-CM | POA: Insufficient documentation

## 2019-06-20 DIAGNOSIS — Z5111 Encounter for antineoplastic chemotherapy: Secondary | ICD-10-CM | POA: Insufficient documentation

## 2019-06-20 DIAGNOSIS — M8580 Other specified disorders of bone density and structure, unspecified site: Secondary | ICD-10-CM | POA: Insufficient documentation

## 2019-06-20 NOTE — Telephone Encounter (Signed)
Missed appt today -Left message for pt to return call.

## 2019-06-20 NOTE — Telephone Encounter (Signed)
Scheduled appt per 1/13 sch message - unable to reach pt . Left message with appt date and time

## 2019-06-20 NOTE — Telephone Encounter (Signed)
Pt thought her appts were tomorrow. Per Julien Nordmann he will see her tomorrow at 1045. Schedule message sent.

## 2019-06-21 ENCOUNTER — Inpatient Hospital Stay: Payer: Medicare Other

## 2019-06-21 ENCOUNTER — Inpatient Hospital Stay (HOSPITAL_BASED_OUTPATIENT_CLINIC_OR_DEPARTMENT_OTHER): Payer: Medicare Other | Admitting: Internal Medicine

## 2019-06-21 ENCOUNTER — Encounter: Payer: Self-pay | Admitting: Internal Medicine

## 2019-06-21 ENCOUNTER — Other Ambulatory Visit: Payer: Self-pay

## 2019-06-21 VITALS — BP 113/78 | HR 80 | Temp 97.4°F | Resp 18 | Ht 63.0 in | Wt 148.6 lb

## 2019-06-21 DIAGNOSIS — C3491 Malignant neoplasm of unspecified part of right bronchus or lung: Secondary | ICD-10-CM

## 2019-06-21 DIAGNOSIS — Z95828 Presence of other vascular implants and grafts: Secondary | ICD-10-CM

## 2019-06-21 DIAGNOSIS — M8580 Other specified disorders of bone density and structure, unspecified site: Secondary | ICD-10-CM | POA: Diagnosis not present

## 2019-06-21 DIAGNOSIS — C3431 Malignant neoplasm of lower lobe, right bronchus or lung: Secondary | ICD-10-CM | POA: Diagnosis not present

## 2019-06-21 DIAGNOSIS — C7951 Secondary malignant neoplasm of bone: Secondary | ICD-10-CM

## 2019-06-21 DIAGNOSIS — D6481 Anemia due to antineoplastic chemotherapy: Secondary | ICD-10-CM

## 2019-06-21 DIAGNOSIS — E7800 Pure hypercholesterolemia, unspecified: Secondary | ICD-10-CM | POA: Diagnosis not present

## 2019-06-21 DIAGNOSIS — M545 Low back pain: Secondary | ICD-10-CM | POA: Diagnosis not present

## 2019-06-21 DIAGNOSIS — I10 Essential (primary) hypertension: Secondary | ICD-10-CM | POA: Diagnosis not present

## 2019-06-21 DIAGNOSIS — Z5111 Encounter for antineoplastic chemotherapy: Secondary | ICD-10-CM | POA: Diagnosis not present

## 2019-06-21 DIAGNOSIS — Z79899 Other long term (current) drug therapy: Secondary | ICD-10-CM | POA: Diagnosis not present

## 2019-06-21 DIAGNOSIS — Z7952 Long term (current) use of systemic steroids: Secondary | ICD-10-CM | POA: Diagnosis not present

## 2019-06-21 DIAGNOSIS — Z791 Long term (current) use of non-steroidal anti-inflammatories (NSAID): Secondary | ICD-10-CM | POA: Diagnosis not present

## 2019-06-21 DIAGNOSIS — T451X5A Adverse effect of antineoplastic and immunosuppressive drugs, initial encounter: Secondary | ICD-10-CM | POA: Diagnosis not present

## 2019-06-21 LAB — CBC WITH DIFFERENTIAL (CANCER CENTER ONLY)
Abs Immature Granulocytes: 0.02 10*3/uL (ref 0.00–0.07)
Basophils Absolute: 0 10*3/uL (ref 0.0–0.1)
Basophils Relative: 0 %
Eosinophils Absolute: 0 10*3/uL (ref 0.0–0.5)
Eosinophils Relative: 0 %
HCT: 31.1 % — ABNORMAL LOW (ref 36.0–46.0)
Hemoglobin: 10.3 g/dL — ABNORMAL LOW (ref 12.0–15.0)
Immature Granulocytes: 0 %
Lymphocytes Relative: 16 %
Lymphs Abs: 1 10*3/uL (ref 0.7–4.0)
MCH: 34.2 pg — ABNORMAL HIGH (ref 26.0–34.0)
MCHC: 33.1 g/dL (ref 30.0–36.0)
MCV: 103.3 fL — ABNORMAL HIGH (ref 80.0–100.0)
Monocytes Absolute: 0.8 10*3/uL (ref 0.1–1.0)
Monocytes Relative: 13 %
Neutro Abs: 4.1 10*3/uL (ref 1.7–7.7)
Neutrophils Relative %: 71 %
Platelet Count: 243 10*3/uL (ref 150–400)
RBC: 3.01 MIL/uL — ABNORMAL LOW (ref 3.87–5.11)
RDW: 14.3 % (ref 11.5–15.5)
WBC Count: 5.8 10*3/uL (ref 4.0–10.5)
nRBC: 0 % (ref 0.0–0.2)

## 2019-06-21 LAB — CMP (CANCER CENTER ONLY)
ALT: 13 U/L (ref 0–44)
AST: 28 U/L (ref 15–41)
Albumin: 3.4 g/dL — ABNORMAL LOW (ref 3.5–5.0)
Alkaline Phosphatase: 65 U/L (ref 38–126)
Anion gap: 7 (ref 5–15)
BUN: 24 mg/dL — ABNORMAL HIGH (ref 8–23)
CO2: 25 mmol/L (ref 22–32)
Calcium: 8.9 mg/dL (ref 8.9–10.3)
Chloride: 107 mmol/L (ref 98–111)
Creatinine: 1.13 mg/dL — ABNORMAL HIGH (ref 0.44–1.00)
GFR, Est AFR Am: 57 mL/min — ABNORMAL LOW (ref >60)
GFR, Estimated: 49 mL/min — ABNORMAL LOW (ref >60)
Glucose, Bld: 107 mg/dL — ABNORMAL HIGH (ref 70–99)
Potassium: 3.9 mmol/L (ref 3.5–5.1)
Sodium: 139 mmol/L (ref 135–145)
Total Bilirubin: 0.5 mg/dL (ref 0.3–1.2)
Total Protein: 6.8 g/dL (ref 6.5–8.1)

## 2019-06-21 MED ORDER — SODIUM CHLORIDE 0.9 % IV SOLN
Freq: Once | INTRAVENOUS | Status: AC
  Filled 2019-06-21: qty 250

## 2019-06-21 MED ORDER — PROCHLORPERAZINE MALEATE 10 MG PO TABS
ORAL_TABLET | ORAL | Status: AC
  Filled 2019-06-21: qty 1

## 2019-06-21 MED ORDER — SODIUM CHLORIDE 0.9 % IV SOLN
500.0000 mg/m2 | Freq: Once | INTRAVENOUS | Status: AC
  Administered 2019-06-21: 12:00:00 900 mg via INTRAVENOUS
  Filled 2019-06-21: qty 16

## 2019-06-21 MED ORDER — HEPARIN SOD (PORK) LOCK FLUSH 100 UNIT/ML IV SOLN
500.0000 [IU] | Freq: Once | INTRAVENOUS | Status: AC | PRN
  Administered 2019-06-21: 13:00:00 500 [IU]
  Filled 2019-06-21: qty 5

## 2019-06-21 MED ORDER — SODIUM CHLORIDE 0.9% FLUSH
10.0000 mL | Freq: Once | INTRAVENOUS | Status: AC
  Administered 2019-06-21: 10 mL
  Filled 2019-06-21: qty 10

## 2019-06-21 MED ORDER — PROCHLORPERAZINE MALEATE 10 MG PO TABS
10.0000 mg | ORAL_TABLET | Freq: Once | ORAL | Status: AC
  Administered 2019-06-21: 10 mg via ORAL

## 2019-06-21 MED ORDER — SODIUM CHLORIDE 0.9% FLUSH
10.0000 mL | INTRAVENOUS | Status: DC | PRN
  Administered 2019-06-21: 13:00:00 10 mL
  Filled 2019-06-21: qty 10

## 2019-06-21 NOTE — Progress Notes (Signed)
[facility] [phone]   Fax:([phone]) [place], [place] [address] [place] [address]  DIAGNOSIS: Stage IV (T1b, N2, M1b) non-small cell lung cancer, adenocarcinoma diagnosed in March 2017 and presented with right lower lobe lung nodule in addition to mediastinal lymphadenopathy and metastatic bone lesions.  Molecular studies: PDL 1 TPS  <1%.   [facility] Studies: Positive for ERBB2 [id number]. Negative for EGFR, KRAS, ALK, BRAF, MET, RET and ROS1.  PRIOR THERAPY:  1) Induction systemic chemotherapy with carboplatin for AUC of 5 and Alimta 500 MG/M2 every 3 weeks is status post 6 cycles at the [facility], last dose was given 03/05/2016 with stable disease. 2) palliative radiation to the metastatic bone disease in the lower back and pelvic area.  CURRENT THERAPY::  1)  Maintenance systemic chemotherapy with Alimta 500 MG/M2 every 3 weeks status post 56 cycles. First dose was given 03/25/2016.  2) Zometa 4 mg IV every 12 week for metastatic bone disease.  INTERVAL HIST Valerie Long 74 y.o. female returns to the clinic today for follow-up visit.  The patient is feeling fine today with no concerning complaints except for the persistent low back pain with radiation to the right leg.  She was given prescription for Lyrica by her primary care physician but she was worried about taking it.  She denied having any current chest pain, shortness of breath, cough or hemoptysis.  She denied having any fever or chills.  She has no nausea, vomiting, diarrhea or constipation.  She has no headache or visual changes.  She had repeat CT scan of the chest, abdomen pelvis performed recently and she is here for evaluation and discussion of her risk her results.  MEDICAL HISTORY: Past Medical History:  Diagnosis Date  . Adenocarcinoma of right lung, stage 4 ([place]) 2017  . Anemia   . Arthritis   . Bone  metastases ([place]) 02/24/2017  . Encounter for antineoplastic chemotherapy 03/17/2016  . Hypercholesterolemia   . Hypertension 06/18/2016  . Osteopenia   . 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 Pneumonia   . Shortness of breath dyspnea     ALLERGIES:  has No Known Allergies.  MEDICATIONS:  Current Outpatient Medications  Medication Sig Dispense Refill  . acetaminophen (TYLENOL) 500 MG tablet Take 1,000 mg by mouth every 6 (six) hours as needed for moderate pain or headache.    . baclofen (LIORESAL) 10 MG tablet Take 1 tablet (10 mg total) by mouth 3 (three) times daily. (Patient not taking: Reported on 03/28/2019) 30 each 0  . Calcium Carb-Cholecalciferol (CALCIUM 600/VITAMIN D3 PO) Take by mouth.    . Cyanocobalamin (B-12 PO) Take 2 tablets by mouth daily.    Marland Kitchen dexamethasone (DECADRON) 4 MG tablet Take 10m by mouth twice daily the day before, of, and after chemo 40 tablet 1  . Fish Oil-Cholecalciferol (OMEGA-3 + VITAMIN D3 PO) Take 1 tablet by mouth daily.    . folic acid (FOLVITE) 1 MG tablet Take 1 tablet (1 mg total) by mouth daily. 90 tablet 0  . lidocaine-prilocaine (EMLA) cream Apply 1 application topically as needed. Apply 1-2 tsp over port site 1-2 hours prior to chemo. 30 g 0  . meloxicam (MOBIC) 15 MG tablet TAKE 1 TABLET BY MOUTH EVERY DAY AS NEEDED FOR PAIN (Patient not taking: Reported on 05/31/2019) 30 tablet 0  . ondansetron (  ZOFRAN) 8 MG tablet Take 8 mg by mouth every 8 (eight) hours as needed.    Marland Kitchen OVER THE COUNTER MEDICATION Apply 1 drop topically daily as needed. 1 drop CBD oil in vicks vapor rub . Applied to right thigh for pain relief.    . pregabalin (LYRICA) 75 MG capsule 1 tablet daily for 10 days, then 1 tablet twice a day 60 capsule 3  . PRESCRIPTION MEDICATION Inject 1 Dose as directed every 21 ( twenty-one) days. Chemo    . prochlorperazine (COMPAZINE) 10 MG tablet Take 1 tablet (10 mg total) by mouth every 6 (six) hours as needed for nausea or  vomiting. (Patient not taking: Reported on 05/31/2019) 30 tablet 0   No current facility-administered medications for this visit.    SURGICAL HISTORY:  Past Surgical History:  Procedure Laterality Date  . CESAREAN SECTION     myomectomy  . COLONOSCOPY W/ POLYPECTOMY    . IR GENERIC HISTORICAL  08/17/2016   IR US GUIDE VASC ACCESS RIGHT 08/17/2016 [name], MD [facility]  . IR GENERIC HISTORICAL  08/17/2016   IR FLUORO GUIDE PORT INSERTION RIGHT 08/17/2016 [name], MD [facility]  . MEDIASTINOSCOPY N/A 09/25/2015   Procedure: MEDIASTINOSCOPY;  Surgeon: Melrose Nakayama, MD;  Location: [facility];  Service: Thoracic;  Laterality: N/A;  . VIDEO BRONCHOSCOPY Bilateral 08/19/2015   Procedure: VIDEO BRONCHOSCOPY WITH FLUORO;  Surgeon: Rigoberto Noel, MD;  Location: [facility];  Service: Cardiopulmonary;  Laterality: Bilateral;  . VIDEO BRONCHOSCOPY N/A 09/08/2015   Procedure: VIDEO BRONCHOSCOPY WITH FLUORO;  Surgeon: Rigoberto Noel, MD;  Location: [facility];  Service: Thoracic;  Laterality: N/A;  . VIDEO BRONCHOSCOPY WITH ENDOBRONCHIAL ULTRASOUND Right 09/08/2015   Procedure: ATTEMPTED VIDEO BRONCHOSCOPY ENDOBRONCHIAL ULTRASOUND  ;  Surgeon: Rigoberto Noel, MD;  Location: [facility];  Service: Thoracic;  Laterality: Right;    REVIEW OF SYSTEMS:  Constitutional: positive for fatigue Eyes: negative Ears, nose, mouth, throat, and face: negative Respiratory: negative Cardiovascular: negative Gastrointestinal: negative Genitourinary:negative Integument/breast: negative Hematologic/lymphatic: negative Musculoskeletal:negative Neurological: negative Behavioral/Psych: negative Endocrine: negative Allergic/Immunologic: negative   PHYSICAL EXAMINATION: General appearance: alert, cooperative, fatigued and no distress Head: Normocephalic, without obvious abnormality, atraumatic Neck: no adenopathy, no JVD, supple, symmetrical, trachea midline and thyroid not enlarged, symmetric, no  tenderness/mass/nodules Lymph nodes: Cervical, supraclavicular, and axillary nodes normal. Resp: clear to auscultation bilaterally Back: symmetric, no curvature. ROM normal. No CVA tenderness. Cardio: regular rate and rhythm, S1, S2 normal, no murmur, click, rub or gallop GI: soft, non-tender; bowel sounds normal; no masses,  no organomegaly Extremities: extremities normal, atraumatic, no cyanosis or edema Neurologic: Alert and oriented X 3, normal strength and tone. Normal symmetric reflexes. Normal coordination and gait  ECOG PERFORMANCE STATUS: 1 - Symptomatic but completely ambulatory  Blood pressure 113/78, pulse 80, temperature (!) 97.4 F (36.3 C), temperature source Temporal, resp. rate 18, height _0  (1.6 m), weight 148 lb 9.6 oz (67.4 kg), last menstrual period 06/09/1998, SpO2 100 %.  LABORATORY DATA: Lab Results  Component Value Date   WBC 5.8 06/21/2019   HGB 10.3 (L) 06/21/2019   HCT 31.1 (L) 06/21/2019   MCV 103.3 (H) 06/21/2019   PLT 243 06/21/2019      Chemistry      Component Value Date/Time   NA 139 05/31/2019 1035   NA 137 06/09/2017 0901   K 4.0 05/31/2019 1035   K 3.9 06/09/2017 0901   CL 105 05/31/2019 1035   CO2 25 05/31/2019  1035   CO2 25 06/09/2017 0901   BUN 23 05/31/2019 1035   BUN 19.0 06/09/2017 0901   CREATININE 1.09 (H) 05/31/2019 1035   CREATININE 1.2 (H) 06/09/2017 0901   GLU 170 01/23/2016 0000      Component Value Date/Time   CALCIUM 9.3 05/31/2019 1035   CALCIUM 9.9 06/09/2017 0901   ALKPHOS 63 05/31/2019 1035   ALKPHOS 63 06/09/2017 0901   AST 34 05/31/2019 1035   AST 35 (H) 06/09/2017 0901   ALT 16 05/31/2019 1035   ALT 23 06/09/2017 0901   BILITOT 0.6 05/31/2019 1035   BILITOT 0.65 06/09/2017 0901       RADIOGRAPHIC STUDIES: CT Chest W Contrast  Result Date: 06/19/2019 CLINICAL DATA:  Non-small-cell lung cancer. EXAM: CT CHEST, ABDOMEN, AND PELVIS WITH CONTRAST TECHNIQUE: Multidetector CT imaging of the chest, abdomen  and pelvis was performed following the standard protocol during bolus administration of intravenous contrast. CONTRAST:  175m OMNIPAQUE IOHEXOL 300 MG/ML  SOLN COMPARISON:  03/27/2019 FINDINGS: CT CHEST FINDINGS Cardiovascular: Heart size upper normal. No substantial pericardial effusion. Atherosclerotic calcification is noted in the wall of the thoracic aorta. Right Port-A-Cath tip position in the distal SVC. Mediastinum/Nodes: No mediastinal lymphadenopathy. There is no hilar lymphadenopathy. The esophagus has normal imaging features. There is no axillary lymphadenopathy. Lungs/Pleura: Bilateral irregular pulmonary nodules again noted. Index right upper lobe nodule on 41/4 is 1.4 cm today compared to 1.2 cm previously. Index nodule superior segment right lower lobe (46/4) is 1.0 cm today compared to 1.0 cm previously. Anterior right lower lobe mass is 3.6 x 2.5 cm today (84/4) measured previously at 3.5 x 2.5. Index lingular nodule measures 1.2 cm today (65/4) compared to 1.0 cm previously. Index medial right lower lobe nodule measures 1.3 cm today (compared to 1.3 cm previously. No definite new pulmonary nodule or mass.  No pleural effusion. Musculoskeletal: Multiple sclerotic bone metastases in the thoracic spine and sternum as well as bilateral ribs are similar to prior. CT ABDOMEN PELVIS FINDINGS Hepatobiliary: No suspicious focal abnormality within the liver parenchyma. Tiny calcified gallstones evident. No intrahepatic or extrahepatic biliary dilation. Pancreas: No focal mass lesion. No dilatation of the main duct. No intraparenchymal cyst. No peripancreatic edema. Spleen: No splenomegaly. No focal mass lesion. Adrenals/Urinary Tract: No adrenal nodule or mass. Right kidney unremarkable. Left kidney is located in the pelvis. No evidence for hydroureter. The urinary bladder appears normal for the degree of distention. Stomach/Bowel: Stomach is unremarkable. No gastric wall thickening. No evidence of outlet  obstruction. Duodenum does not cross the midline with small-bowel concentrated in the right abdomen and colon concentrated in the left abdomen, features compatible with intestinal malrotation. Terminal ileum unremarkable. The appendix is normal. Vascular/Lymphatic: There is abdominal aortic atherosclerosis without aneurysm. There is no gastrohepatic or hepatoduodenal ligament lymphadenopathy. No retroperitoneal or mesenteric lymphadenopathy. No pelvic sidewall lymphadenopathy. Reproductive: Unremarkable Other: No intraperitoneal free fluid. Musculoskeletal: Scattered sclerotic bone metastases, similar to prior. IMPRESSION: 1. Similar appearance of numerous bilateral pulmonary nodules measuring stable to, in some cases, minimally larger today. No gross change and no clear trend towards progression or improvement. 2. No evidence for soft tissue metastatic disease in the abdomen or pelvis. 3. Similar appearance of numerous sclerotic bone lesions, compatible with metastatic involvement. 4. Intestinal malrotation. 5. Cholelithiasis. 6.  Aortic Atherosclerois (ICD10-170.0) Electronically Signed   By: EMisty StanleyM.D.   On: 06/19/2019 14:58   CT Abdomen Pelvis W Contrast  Result Date: 06/19/2019 CLINICAL DATA:  Non-small-cell lung  cancer. EXAM: CT CHEST, ABDOMEN, AND PELVIS WITH CONTRAST TECHNIQUE: Multidetector CT imaging of the chest, abdomen and pelvis was performed following the standard protocol during bolus administration of intravenous contrast. CONTRAST:  163m OMNIPAQUE IOHEXOL 300 MG/ML  SOLN COMPARISON:  03/27/2019 FINDINGS: CT CHEST FINDINGS Cardiovascular: Heart size upper normal. No substantial pericardial effusion. Atherosclerotic calcification is noted in the wall of the thoracic aorta. Right Port-A-Cath tip position in the distal SVC. Mediastinum/Nodes: No mediastinal lymphadenopathy. There is no hilar lymphadenopathy. The esophagus has normal imaging features. There is no axillary lymphadenopathy.  Lungs/Pleura: Bilateral irregular pulmonary nodules again noted. Index right upper lobe nodule on 41/4 is 1.4 cm today compared to 1.2 cm previously. Index nodule superior segment right lower lobe (46/4) is 1.0 cm today compared to 1.0 cm previously. Anterior right lower lobe mass is 3.6 x 2.5 cm today (84/4) measured previously at 3.5 x 2.5. Index lingular nodule measures 1.2 cm today (65/4) compared to 1.0 cm previously. Index medial right lower lobe nodule measures 1.3 cm today (compared to 1.3 cm previously. No definite new pulmonary nodule or mass.  No pleural effusion. Musculoskeletal: Multiple sclerotic bone metastases in the thoracic spine and sternum as well as bilateral ribs are similar to prior. CT ABDOMEN PELVIS FINDINGS Hepatobiliary: No suspicious focal abnormality within the liver parenchyma. Tiny calcified gallstones evident. No intrahepatic or extrahepatic biliary dilation. Pancreas: No focal mass lesion. No dilatation of the main duct. No intraparenchymal cyst. No peripancreatic edema. Spleen: No splenomegaly. No focal mass lesion. Adrenals/Urinary Tract: No adrenal nodule or mass. Right kidney unremarkable. Left kidney is located in the pelvis. No evidence for hydroureter. The urinary bladder appears normal for the degree of distention. Stomach/Bowel: Stomach is unremarkable. No gastric wall thickening. No evidence of outlet obstruction. Duodenum does not cross the midline with small-bowel concentrated in the right abdomen and colon concentrated in the left abdomen, features compatible with intestinal malrotation. Terminal ileum unremarkable. The appendix is normal. Vascular/Lymphatic: There is abdominal aortic atherosclerosis without aneurysm. There is no gastrohepatic or hepatoduodenal ligament lymphadenopathy. No retroperitoneal or mesenteric lymphadenopathy. No pelvic sidewall lymphadenopathy. Reproductive: Unremarkable Other: No intraperitoneal free fluid. Musculoskeletal: Scattered sclerotic  bone metastases, similar to prior. IMPRESSION: 1. Similar appearance of numerous bilateral pulmonary nodules measuring stable to, in some cases, minimally larger today. No gross change and no clear trend towards progression or improvement. 2. No evidence for soft tissue metastatic disease in the abdomen or pelvis. 3. Similar appearance of numerous sclerotic bone lesions, compatible with metastatic involvement. 4. Intestinal malrotation. 5. Cholelithiasis. 6.  Aortic Atherosclerois (ICD10-170.0) Electronically Signed   By: EMisty StanleyM.D.   On: 06/19/2019 14:58    ASSESSMENT AND PLAN:  This is a very pleasant 74years old Hispanic female with metastatic non-small cell lung cancer, adenocarcinoma status post induction systemic chemotherapy with carboplatin and Alimta and she is currently on maintenance treatment with single agent Alimta status post 56 cycles. The patient continues to tolerate this treatment well with no concerning adverse effect except for mild fatigue. She had repeat CT scan of the chest, abdomen pelvis performed recently.  I personally and independently reviewed the scans and discussed the results with the patient today. Her scan showed no concerning findings for disease progression and in general she has a stable disease. I recommended for the patient to continue her current maintenance treatment with Alimta and she will proceed with cycle #57 today as planned. She will come back for follow-up visit in 3 weeks for evaluation  before the next cycle of her treatment. For the back pain, she will continue her current treatment with Mobic and Lyrica. The patient was advised to call immediately if she has any other concerning symptoms in the interval. The patient voices understanding of current disease status and treatment options and is in agreement with the current care plan. All questions were answered. The patient knows to call the clinic with any problems, questions or concerns. We  can certainly see the patient much sooner if necessary.  Disclaimer: This note was dictated with voice recognition software. Similar sounding words can inadvertently be transcribed and may not be corrected upon review.

## 2019-06-21 NOTE — Patient Instructions (Signed)
[facility] Discharge Instructions for Patients Receiving Chemotherapy  Today you received the following chemotherapy agents: alimta   To help prevent nausea and vomiting after your treatment, we encourage you to take your nausea medication as directed.    If you develop nausea and vomiting that is not controlled by your nausea medication, call the clinic.   BELOW ARE SYMPTOMS THAT SHOULD BE REPORTED IMMEDIATELY:  *FEVER GREATER THAN 100.5 F  *CHILLS WITH OR WITHOUT FEVER  NAUSEA AND VOMITING THAT IS NOT CONTROLLED WITH YOUR NAUSEA MEDICATION  *UNUSUAL SHORTNESS OF BREATH  *UNUSUAL BRUISING OR BLEEDING  TENDERNESS IN MOUTH AND THROAT WITH OR WITHOUT PRESENCE OF ULCERS  *URINARY PROBLEMS  *BOWEL PROBLEMS  UNUSUAL RASH Items with * indicate a potential emergency and should be followed up as soon as possible.  Feel free to call the clinic should you have any questions or concerns. The clinic phone number is [phone].  Please show the [facility] at check-in to the Emergency Department and triage nurse.

## 2019-06-22 ENCOUNTER — Telehealth: Payer: Self-pay | Admitting: Internal Medicine

## 2019-06-22 NOTE — Telephone Encounter (Signed)
Scheduled per 1/14 los. Called and spoke with pt, confirmed 2/25 and 3/18 appt

## 2019-07-12 ENCOUNTER — Other Ambulatory Visit: Payer: Self-pay | Admitting: Internal Medicine

## 2019-07-12 ENCOUNTER — Inpatient Hospital Stay: Payer: Medicare Other

## 2019-07-12 ENCOUNTER — Other Ambulatory Visit: Payer: Self-pay

## 2019-07-12 ENCOUNTER — Inpatient Hospital Stay: Payer: Medicare Other | Attending: Internal Medicine | Admitting: Internal Medicine

## 2019-07-12 ENCOUNTER — Encounter: Payer: Self-pay | Admitting: Internal Medicine

## 2019-07-12 VITALS — BP 152/89 | HR 99 | Temp 97.8°F | Resp 20 | Ht 63.0 in | Wt 151.7 lb

## 2019-07-12 DIAGNOSIS — Z79899 Other long term (current) drug therapy: Secondary | ICD-10-CM | POA: Insufficient documentation

## 2019-07-12 DIAGNOSIS — C3491 Malignant neoplasm of unspecified part of right bronchus or lung: Secondary | ICD-10-CM

## 2019-07-12 DIAGNOSIS — E7800 Pure hypercholesterolemia, unspecified: Secondary | ICD-10-CM | POA: Insufficient documentation

## 2019-07-12 DIAGNOSIS — Z7952 Long term (current) use of systemic steroids: Secondary | ICD-10-CM | POA: Insufficient documentation

## 2019-07-12 DIAGNOSIS — Z791 Long term (current) use of non-steroidal anti-inflammatories (NSAID): Secondary | ICD-10-CM | POA: Insufficient documentation

## 2019-07-12 DIAGNOSIS — I10 Essential (primary) hypertension: Secondary | ICD-10-CM | POA: Insufficient documentation

## 2019-07-12 DIAGNOSIS — C3431 Malignant neoplasm of lower lobe, right bronchus or lung: Secondary | ICD-10-CM | POA: Diagnosis not present

## 2019-07-12 DIAGNOSIS — Z95828 Presence of other vascular implants and grafts: Secondary | ICD-10-CM

## 2019-07-12 DIAGNOSIS — M8580 Other specified disorders of bone density and structure, unspecified site: Secondary | ICD-10-CM | POA: Diagnosis not present

## 2019-07-12 DIAGNOSIS — C7951 Secondary malignant neoplasm of bone: Secondary | ICD-10-CM

## 2019-07-12 DIAGNOSIS — Z5111 Encounter for antineoplastic chemotherapy: Secondary | ICD-10-CM | POA: Insufficient documentation

## 2019-07-12 LAB — CBC WITH DIFFERENTIAL (CANCER CENTER ONLY)
Abs Immature Granulocytes: 0.01 10*3/uL (ref 0.00–0.07)
Basophils Absolute: 0 10*3/uL (ref 0.0–0.1)
Basophils Relative: 0 %
Eosinophils Absolute: 0 10*3/uL (ref 0.0–0.5)
Eosinophils Relative: 0 %
HCT: 32.6 % — ABNORMAL LOW (ref 36.0–46.0)
Hemoglobin: 11 g/dL — ABNORMAL LOW (ref 12.0–15.0)
Immature Granulocytes: 0 %
Lymphocytes Relative: 13 %
Lymphs Abs: 0.7 10*3/uL (ref 0.7–4.0)
MCH: 33.8 pg (ref 26.0–34.0)
MCHC: 33.7 g/dL (ref 30.0–36.0)
MCV: 100.3 fL — ABNORMAL HIGH (ref 80.0–100.0)
Monocytes Absolute: 0.5 10*3/uL (ref 0.1–1.0)
Monocytes Relative: 9 %
Neutro Abs: 4.3 10*3/uL (ref 1.7–7.7)
Neutrophils Relative %: 78 %
Platelet Count: 229 10*3/uL (ref 150–400)
RBC: 3.25 MIL/uL — ABNORMAL LOW (ref 3.87–5.11)
RDW: 13.9 % (ref 11.5–15.5)
WBC Count: 5.5 10*3/uL (ref 4.0–10.5)
nRBC: 0 % (ref 0.0–0.2)

## 2019-07-12 LAB — CMP (CANCER CENTER ONLY)
ALT: 17 U/L (ref 0–44)
AST: 32 U/L (ref 15–41)
Albumin: 3.5 g/dL (ref 3.5–5.0)
Alkaline Phosphatase: 59 U/L (ref 38–126)
Anion gap: 8 (ref 5–15)
BUN: 23 mg/dL (ref 8–23)
CO2: 24 mmol/L (ref 22–32)
Calcium: 9.4 mg/dL (ref 8.9–10.3)
Chloride: 106 mmol/L (ref 98–111)
Creatinine: 0.95 mg/dL (ref 0.44–1.00)
GFR, Est AFR Am: >60 mL/min (ref >60)
GFR, Estimated: >60 mL/min (ref >60)
Glucose, Bld: 120 mg/dL — ABNORMAL HIGH (ref 70–99)
Potassium: 4.1 mmol/L (ref 3.5–5.1)
Sodium: 138 mmol/L (ref 135–145)
Total Bilirubin: 0.6 mg/dL (ref 0.3–1.2)
Total Protein: 6.6 g/dL (ref 6.5–8.1)

## 2019-07-12 MED ORDER — ZOLEDRONIC ACID 4 MG/100ML IV SOLN
INTRAVENOUS | Status: AC
  Filled 2019-07-12: qty 100

## 2019-07-12 MED ORDER — CYANOCOBALAMIN 1000 MCG/ML IJ SOLN
1000.0000 ug | Freq: Once | INTRAMUSCULAR | Status: AC
  Administered 2019-07-12: 1000 ug via INTRAMUSCULAR

## 2019-07-12 MED ORDER — ZOLEDRONIC ACID 4 MG/100ML IV SOLN
4.0000 mg | Freq: Once | INTRAVENOUS | Status: AC
  Administered 2019-07-12: 4 mg via INTRAVENOUS

## 2019-07-12 MED ORDER — DEXAMETHASONE 4 MG PO TABS: ORAL_TABLET | ORAL | 1 refills | Status: DC

## 2019-07-12 MED ORDER — SODIUM CHLORIDE 0.9% FLUSH
10.0000 mL | Freq: Once | INTRAVENOUS | Status: AC
  Administered 2019-07-12: 10 mL
  Filled 2019-07-12: qty 10

## 2019-07-12 MED ORDER — LIDOCAINE-PRILOCAINE 2.5-2.5 % EX CREA: 1.0000 "application " | TOPICAL_CREAM | CUTANEOUS | 0 refills | Status: DC | PRN

## 2019-07-12 MED ORDER — PROCHLORPERAZINE MALEATE 10 MG PO TABS
10.0000 mg | ORAL_TABLET | Freq: Once | ORAL | Status: AC
  Administered 2019-07-12: 10 mg via ORAL

## 2019-07-12 MED ORDER — SODIUM CHLORIDE 0.9% FLUSH
10.0000 mL | INTRAVENOUS | Status: DC | PRN
  Administered 2019-07-12: 10 mL
  Filled 2019-07-12: qty 10

## 2019-07-12 MED ORDER — CYANOCOBALAMIN 1000 MCG/ML IJ SOLN
INTRAMUSCULAR | Status: AC
  Filled 2019-07-12: qty 1

## 2019-07-12 MED ORDER — SODIUM CHLORIDE 0.9 % IV SOLN
500.0000 mg/m2 | Freq: Once | INTRAVENOUS | Status: AC
  Administered 2019-07-12: 900 mg via INTRAVENOUS
  Filled 2019-07-12: qty 20

## 2019-07-12 MED ORDER — PROCHLORPERAZINE MALEATE 10 MG PO TABS
ORAL_TABLET | ORAL | Status: AC
  Filled 2019-07-12: qty 1

## 2019-07-12 MED ORDER — SODIUM CHLORIDE 0.9 % IV SOLN
Freq: Once | INTRAVENOUS | Status: AC
  Filled 2019-07-12: qty 250

## 2019-07-12 MED ORDER — HEPARIN SOD (PORK) LOCK FLUSH 100 UNIT/ML IV SOLN
500.0000 [IU] | Freq: Once | INTRAVENOUS | Status: AC | PRN
  Administered 2019-07-12: 500 [IU]
  Filled 2019-07-12: qty 5

## 2019-07-12 NOTE — Patient Instructions (Signed)
[facility] Discharge Instructions for Patients Receiving Chemotherapy  Today you received the following chemotherapy agents: Pemetrexed (Alimta) & Zometa  To help prevent nausea and vomiting after your treatment, we encourage you to take your nausea medication as directed.    If you develop nausea and vomiting that is not controlled by your nausea medication, call the clinic.   BELOW ARE SYMPTOMS THAT SHOULD BE REPORTED IMMEDIATELY:  *FEVER GREATER THAN 100.5 F  *CHILLS WITH OR WITHOUT FEVER  NAUSEA AND VOMITING THAT IS NOT CONTROLLED WITH YOUR NAUSEA MEDICATION  *UNUSUAL SHORTNESS OF BREATH  *UNUSUAL BRUISING OR BLEEDING  TENDERNESS IN MOUTH AND THROAT WITH OR WITHOUT PRESENCE OF ULCERS  *URINARY PROBLEMS  *BOWEL PROBLEMS  UNUSUAL RASH Items with * indicate a potential emergency and should be followed up as soon as possible.  Feel free to call the clinic should you have any questions or concerns. The clinic phone number is [phone].  Please show the [place] at check-in to the Emergency Department and triage nurse.  Zoledronic Acid injection (Hypercalcemia, Oncology) O que  este medicamento? O CIDO ZOLEDRNICO diminui a quantidade da perda de [employer].  usado para tratar o excesso de clcio no sangue provocado pelo cncer. Tambm  usado para prevenir as complicaes de cnceres que tenham se espalhado para os ossos. Este medicamento pode ser usado para outros propsitos; em caso de dvidas, pergunte ao seu profissional de sade ou farmacutico. NOMES DE MARCAS COMUNS: Zometa O que devo dizer a meu profissional de sade antes de tomar este medicamento? Precisam saber se voc tem algum dos seguintes problemas ou estados de sade:  asma induzida por aspirina  cncer, principalmente se estiver tomando medicamentos usados para o tratamento de cncer  doenas dentrias ou uso de dentaduras  infeco  doenas renais  recebendo  corticoides, como dexametasona ou prednisona  reao estranha ou alergia ao cido zoledrnico  reao estranha ou alergia a outros medicamentos  reao estranha ou alergia a alimentos, corantes ou conservantes  est grvida ou tentando engravidar  est amamentando Como devo usar este medicamento? Este medicamento  para infuso intravenosa. Este medicamento  administrado por um profissional da sade no hospital ou em consultrio. Fale com seu pediatra a respeito do uso deste medicamento em crianas. Pode ser preciso tomar alguns cuidados especiais. Superdosagem: Se achar que tomou uma superdosagem deste medicamento, entre em contato imediatamente com o [facility] de Intoxicaes ou v a [employer]. OBSERVAO: Este medicamento  s para voc. No compartilhe este medicamento com outras pessoas. E se eu deixar de tomar uma dose?  importante que voc no perca nenhuma dose. Se no puder comparecer a uma consulta, entre em contato com seu mdico ou profissional de sade. O que pode interagir com este medicamento?  alguns antibiticos aplicados como injeo  AINEs, medicamentos analgsicos e anti-inflamatrios, como ibuprofeno, naproxeno  alguns diurticos, como bumetanida, furosemida  teriparatida  talidomida Esta lista pode no descrever todas as interaes possveis. D ao seu profissional de sade uma lista de todos os medicamentos, ervas medicinais, remdios de venda livre, ou suplementos alimentares que voc [place]. Diga tambm se voc fuma, bebe, ou [place] drogas ilcitas. Alguns destes podem interagir com o seu medicamento. Ao que devo ficar atento quando estiver [employer] medicamento? Consulte seu mdico ou profissional de sade para acompanhamento regular Museum/gallery curator. Os benefcios deste medicamento podem demorar algum tempo para aparecer. No pare de usar [employer] a menos que seu mdico  mande. Seu mdico pode pedir exames de sangue e outros exames para  monitorar a sua evoluo. [place] que queiram engravidar ou que acreditem que possam estar grvidas devem avisar aos seus mdicos. H risco de graves efeitos colaterais no feto. Para mais informaes, fale com seu mdico, profissional de sade ou farmacutico. Voc deve consumir clcio e vitamina D em quantidade suficiente enquanto estiver [employer]. [place] com seu mdico ou profissional de sade a respeito dos alimentos que consome e das vitaminas que toma. Algumas pessoas que tomam este medicamento sofrem fortes dores nos ossos, articulaes ou msculos. Este medicamento tambm pode aumentar os riscos de problemas no maxilar ou de uma fratura do fmur. Informe o seu mdico imediatamente se sentir dores fortes no maxilar, ossos, articulaes ou msculos. Avise seu mdico se tiver uma dor que no passa ou que piora. Informe ao seu cirurgio-dentista que est tomando este medicamento. Voc no deve passar por nenhuma cirurgia dental de grande porte enquanto tomar [employer]. Consulte o seu dentista para fazer uma avaliao odontolgica e corrigir quaisquer problemas dentrios antes de comear a Systems developer. Mantenha uma boa higiene oral enquanto tomar [employer]. Assegure-se de que far consultas de acompanhamento odontolgico regularmente. Que efeitos colaterais posso sentir aps usar este medicamento? Efeitos colaterais que devem ser informados ao seu mdico ou profissional de sade o mais rpido possvel:  reaes alrgicas, como erupo na pele, coceira, urticria, ou inchao do rosto, dos lbios ou da lngua  [place], confuso ou depresso  dificuldade para respirar  alteraes na viso  dor nos olhos  sensao de [place], [place], quedas  dor na mandbula ou maxilar, principalmente depois de um procedimento odontolgico  aftas  cibras, rigidez ou fraqueza muscular  vermelhido, bolhas, descamao ou afrouxamento da pele, inclusive dentro da  boca  dificuldade para urinar ou alterao na quantidade de urina Efeitos colaterais que normalmente no precisam de cuidados mdicos (avise ao seu mdico ou profissional de sade se persistirem ou forem incmodos):  dor nos ossos, articulaes ou msculos  priso de ventre  diarreia  febre  queda de cabelo  irritao no local da injeo  perda de apetite  enjoo ou vmitos  desarranjo estomacal  dificuldade para dormir  dificuldade para engolir  fraqueza ou cansao Esta lista pode no descrever todos os efeitos colaterais possveis. Para mais orientaes sobre efeitos colaterais, consulte o seu mdico. Voc pode relatar a ocorrncia de efeitos colaterais  FDA pelo telefone [phone]. Onde devo guardar meu medicamento? Este medicamento  administrado por um profissional da sade no hospital ou em consultrio. Voc no receber este medicamento para conservao em casa. OBSERVAO: Este folheto  um resumo. Pode no cobrir todas as informaes possveis. Se tiver dvidas a respeito deste medicamento, fale com seu mdico, farmacutico ou profissional de sade.  2020 Elsevier/Gold Standard (2016-06-24 00:00:00)  Coronavirus (COVID-19) Are you at risk?  Are you at risk for the Coronavirus (COVID-19)?  To be considered HIGH RISK for Coronavirus (COVID-19), you have to meet the following criteria:  . Traveled to [place], [place], [place], [place] or [place]; or in the [place] to [place], [place], [place], or [place]; and have fever, cough, and shortness of breath within the last 2 weeks of travel OR . Been in close contact with a person diagnosed with COVID-19 within the last 2 weeks and have fever, cough, and shortness of breath . IF YOU DO NOT MEET THESE CRITERIA, YOU ARE CONSIDERED LOW RISK FOR COVID-19.  What to  do if you are HIGH RISK for COVID-19?  Marland Kitchen If you are having a medical emergency, call 911. . Seek medical care right away. Before you go to a  doctor's office, urgent care or emergency department, call ahead and tell them about your recent travel, contact with someone diagnosed with COVID-19, and your symptoms. You should receive instructions from your physician's office regarding next steps of care.  . When you arrive at healthcare provider, tell the healthcare staff immediately you have returned from visiting [place], [place], [place], [place] or [place]; or traveled in the [place] to [place], [place], [place], or [place]; in the last two weeks or you have been in close contact with a person diagnosed with COVID-19 in the last 2 weeks.   . Tell the health care staff about your symptoms: fever, cough and shortness of breath. . After you have been seen by a medical provider, you will be either: o Tested for (COVID-19) and discharged home on quarantine except to seek medical care if symptoms worsen, and asked to  - Stay home and avoid contact with others until you get your results (4-5 days)  - Avoid travel on public transportation if possible (such as bus, train, or airplane) or o Sent to the Emergency Department by EMS for evaluation, COVID-19 testing, and possible admission depending on your condition and test results.  What to do if you are LOW RISK for COVID-19?  Reduce your risk of any infection by using the same precautions used for avoiding the common cold or flu:  Marland Kitchen Wash your hands often with soap and warm water for at least 20 seconds.  If soap and water are not readily available, use an alcohol-based hand sanitizer with at least 60% alcohol.  . If coughing or sneezing, cover your mouth and nose by coughing or sneezing into the elbow areas of your shirt or coat, into a tissue or into your sleeve (not your hands). . Avoid shaking hands with others and consider head nods or verbal greetings only. . Avoid touching your eyes, nose, or mouth with unwashed hands.  . Avoid close contact with people who are sick. . Avoid  places or events with large numbers of people in one location, like concerts or sporting events. . Carefully consider travel plans you have or are making. . If you are planning any travel outside or inside the [place], visit the CDC's Travelers' Health webpage for the latest health notices. . If you have some symptoms but not all symptoms, continue to monitor at home and seek medical attention if your symptoms worsen. . If you are having a medical emergency, call 911.   [place] / e-Visit: [email or web address]         [facility]: [phone]  [facility]: [phone]                   [facility]: [phone]   Zoledronic Acid injection (Hypercalcemia, Oncology) Sander Nephew es este medicamento? El CIDO ZOLEDRNICO reduce la prdida del calcio de los [place]. Se utiliza para tratar el calcio exceso en la sangre causado por el cncer. Se utiliza tambin para prevenir complicaciones de un cncer que se haya extendido a los huesos. Este medicamento puede ser utilizado para otros usos; si tiene alguna pregunta consulte con su proveedor de atencin mdica o con su farmacutico. MARCAS COMUNES: Zometa Qu le debo informar a mi profesional de la  salud antes de tomar este medicamento? Necesita saber si usted presenta alguno de los [employer] o situaciones:  asma sensible a la aspirina  cncer, especialmente si est recibiendo medicamentos usados para tratar el cncer  enfermedad dental o [place] dentadura postiza  infeccin  enfermedad renal  recibiendo corticosteroides como dexametasona o prednisona  una reaccin alrgica o inusual al cido zoledrnico, a otros medicamentos, alimentos, colorantes o conservantes  si est embarazada o buscando quedar embarazada  si est amamantando a un beb Cmo debo utilizar este medicamento? Este medicamento se  administra mediante infusin por va intravenosa. Lo administra un profesional de Technical sales engineer en un hospital o en un entorno clnico. Hable con su pediatra para informarse acerca del uso de este medicamento en nios. Puede requerir atencin especial. Sobredosis: Pngase en contacto inmediatamente con un centro toxicolgico o una sala de urgencia si usted cree que haya tomado demasiado medicamento. ATENCIN: [employer] es solo para usted. No comparta este medicamento con nadie. Qu sucede si me olvido de una dosis? Es importante no olvidar ninguna dosis. Informe a su mdico o a su profesional de la salud si no puede asistir a Photographer. Qu puede interactuar con este medicamento?  ciertos antibiticos administrados por [employer], medicamentos para Conservation officer, historic buildings o inflamacin, como ibuprofeno o naproxeno  ciertos diurticos bumetanida o furosemida  teriparatida  talidomida Puede ser que esta lista no menciona todas las posibles interacciones. Informe a su profesional de [employer]	[place] de [employer] productos a base de hierbas, medicamentos de [place] o suplementos nutritivos que est tomando. Si usted fuma, consume bebidas alcohlicas o si utiliza drogas ilegales, indqueselo tambin a su profesional de [employer]	[place]. Algunas sustancias pueden interactuar con su medicamento. A qu debo estar atento al usar Coca-Cola? Visite a su mdico o a su profesional de la salud para chequeos peridicos. Puede ser necesario que transcurra cierto tiempo antes de que pueda observar los beneficios de [place]. No deje de tomar su medicamento excepto si as lo indica su mdico. Su mdico puede pedirle anlisis de [place] u otras pruebas para Chief Technology Officer su evolucin. Las mujeres deben informar a su mdico si estn buscando quedar embarazadas o si creen que estn embarazadas. Existe la posibilidad de efectos secundarios graves a un beb sin nacer. Para ms informacin hable con su profesional de la  salud o su farmacutico. Debe asegurarse de que usted reciba la cantidad adecuado de calcio y vitamina D mientras recibe este medicamento. Consulte a su profesional de [employer] alimentos y vitamina que toma. Algunas personas que toman este medicamento experimentan dolor grave de [place], de articulaciones o/y de msculos. Este medicamento tambin puede aumentar el riesgo de problemas de la [place] o un fmur roto. Informe a su mdico inmediatamente si tiene dolor grave en la mandbula, huesos, articulaciones o msculos. Si experimenta dolor que no desaparece o que empeora, informe a su mdico. Informa a su dentista y [place] dental de que usted est [employer]. No debe tener Office manager que est usando este medicamento. Consulte al dentista para hacerse un examen dental y arreglase cualquier problema dental antes de comenzar con este medicamento. Cuide sus dientes mientras est [employer]. Asegrese de visitar a su dentista para citas de control regularmente. Qu efectos secundarios puedo tener al [employer] este medicamento? Efectos secundarios que debe informar a su mdico o a Barrister's clerk de la salud tan pronto como sea posible: Chief of Staff, como erupcin cutnea,  comezn/picazn o urticarias, e hinchazn de la cara, los labios o la lengua anxiedad, confusin o depresin problemas respiratorios cambios en la visin dolor ocular sensacin de desmayos o aturdimiento, cadas dolor de la mandbula, especialmente despus de tratamiento odontolgico llagas en la boca calambres, rigidez o debilidad muscular enrojecimiento, formacin de ampollas, descamacin o distensin de la piel, incluso dentro de la boca dificultad para orinar o cambios en el volumen de orina Efectos secundarios que generalmente no requieren atencin mdica (infrmelos a su mdico o a Barrister's clerk de la salud si persisten o si son molestos): dolores en los [place], las  articulaciones o los msculos estreimiento diarrea fiebre cada del cabello irritacin en el lugar de la inyeccin prdida del apetito nuseas, vmito Higher education careers adviser dificultad para conciliar el sueo problemas para tragar debilidad o cansancio Puede ser que esta lista no menciona todos los posibles efectos secundarios. Comunquese a su mdico por asesoramiento mdico [employer]. Usted puede informar los efectos secundarios a la FDA por telfono al 1-800-FDA-1088. Dnde debo guardar mi medicina? Este medicamento se administra en hospitales o clnicas y no necesitar guardarlo en su domicilio. ATENCIN: Este folleto es un resumen. Puede ser que no cubra toda la posible informacin. Si usted tiene preguntas acerca de esta medicina, consulte con su mdico, su farmacutico o su profesional de Technical sales engineer.  2020 Elsevier/Gold Standard (2016-06-24 00:00:00)

## 2019-07-12 NOTE — Patient Instructions (Signed)

## 2019-07-12 NOTE — Progress Notes (Signed)
[place] [phone]   Fax:([phone]) [place], [place] [address] [place] [address]  DIAGNOSIS: Stage IV (T1b, N2, M1b) non-small cell lung cancer, adenocarcinoma diagnosed in March 2017 and presented with right lower lobe lung nodule in addition to mediastinal lymphadenopathy and metastatic bone lesions.  Molecular studies: PDL 1 TPS  <1%.   Foundation One Studies: Positive for ERBB2 [id number]. Negative for EGFR, KRAS, ALK, BRAF, MET, RET and ROS1.  PRIOR THERAPY:  1) Induction systemic chemotherapy with carboplatin for AUC of 5 and Alimta 500 MG/M2 every 3 weeks is status post 6 cycles at the [facility], last dose was given 03/05/2016 with stable disease. 2) palliative radiation to the metastatic bone disease in the lower back and pelvic area.  CURRENT THERAPY::  1)  Maintenance systemic chemotherapy with Alimta 500 MG/M2 every 3 weeks status post 57 cycles. First dose was given 03/25/2016.  2) Zometa 4 mg IV every 12 week for metastatic bone disease.  INTERVAL HIST Valerie Long 74 y.o. female returns to the clinic today for follow-up visit.  The patient is feeling fine today with no concerning complaints except for the intermittent low back pain with radiation to the right leg.  She is undergoing acupuncture with minimal improvement.  She denied having any current chest pain, shortness of breath, cough or hemoptysis.  She denied having any fever or chills.  She has no nausea, vomiting, diarrhea or constipation.  She denied having any headache or visual changes.  The patient is here today for evaluation before starting cycle #58 of her treatment.  MEDICAL HISTORY: Past Medical History:  Diagnosis Date  . Adenocarcinoma of right lung, stage 4 ([place]) 2017  . Anemia   . Arthritis   . Bone metastases ([place]) 02/24/2017  . Encounter for antineoplastic chemotherapy 03/17/2016  .  Hypercholesterolemia   . Hypertension 06/18/2016  . Osteopenia   . 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 Pneumonia   . Shortness of breath dyspnea     ALLERGIES:  has No Known Allergies.  MEDICATIONS:  Current Outpatient Medications  Medication Sig Dispense Refill  . acetaminophen (TYLENOL) 500 MG tablet Take 1,000 mg by mouth every 6 (six) hours as needed for moderate pain or headache.    . Calcium Carb-Cholecalciferol (CALCIUM 600/VITAMIN D3 PO) Take by mouth.    . Cyanocobalamin (B-12 PO) Take 2 tablets by mouth daily.    Marland Kitchen dexamethasone (DECADRON) 4 MG tablet Take 78m by mouth twice daily the day before, of, and after chemo 40 tablet 1  . Fish Oil-Cholecalciferol (OMEGA-3 + VITAMIN D3 PO) Take 1 tablet by mouth daily.    . folic acid (FOLVITE) 1 MG tablet Take 1 tablet (1 mg total) by mouth daily. 90 tablet 0  . lidocaine-prilocaine (EMLA) cream Apply 1 application topically as needed. Apply 1-2 tsp over port site 1-2 hours prior to chemo. 30 g 0  . OVER THE COUNTER MEDICATION Apply 1 drop topically daily as needed. 1 drop CBD oil in vicks vapor rub . Applied to right thigh for pain relief.    . pregabalin (LYRICA) 75 MG capsule 1 tablet daily for 10 days, then 1 tablet twice a day 60 capsule 3  . PRESCRIPTION MEDICATION Inject 1 Dose as directed every 21 ( twenty-one) days. Chemo    . baclofen (LIORESAL) 10 MG tablet Take 1  tablet (10 mg total) by mouth 3 (three) times daily. (Patient not taking: Reported on 03/28/2019) 30 each 0  . meloxicam (MOBIC) 15 MG tablet TAKE 1 TABLET BY MOUTH EVERY DAY AS NEEDED FOR PAIN (Patient not taking: Reported on 05/31/2019) 30 tablet 0  . ondansetron (ZOFRAN) 8 MG tablet Take 8 mg by mouth every 8 (eight) hours as needed.    . prochlorperazine (COMPAZINE) 10 MG tablet Take 1 tablet (10 mg total) by mouth every 6 (six) hours as needed for nausea or vomiting. (Patient not taking: Reported on 05/31/2019) 30 tablet 0   No current  facility-administered medications for this visit.    SURGICAL HISTORY:  Past Surgical History:  Procedure Laterality Date  . CESAREAN SECTION     myomectomy  . COLONOSCOPY W/ POLYPECTOMY    . IR GENERIC HISTORICAL  08/17/2016   IR US GUIDE VASC ACCESS RIGHT 08/17/2016 [name], MD [facility]  . IR GENERIC HISTORICAL  08/17/2016   IR FLUORO GUIDE PORT INSERTION RIGHT 08/17/2016 [name], MD [facility]  . MEDIASTINOSCOPY N/A 09/25/2015   Procedure: MEDIASTINOSCOPY;  Surgeon: Melrose Nakayama, MD;  Location: [facility];  Service: Thoracic;  Laterality: N/A;  . VIDEO BRONCHOSCOPY Bilateral 08/19/2015   Procedure: VIDEO BRONCHOSCOPY WITH FLUORO;  Surgeon: Rigoberto Noel, MD;  Location: [place];  Service: Cardiopulmonary;  Laterality: Bilateral;  . VIDEO BRONCHOSCOPY N/A 09/08/2015   Procedure: VIDEO BRONCHOSCOPY WITH FLUORO;  Surgeon: Rigoberto Noel, MD;  Location: [place];  Service: Thoracic;  Laterality: N/A;  . VIDEO BRONCHOSCOPY WITH ENDOBRONCHIAL ULTRASOUND Right 09/08/2015   Procedure: ATTEMPTED VIDEO BRONCHOSCOPY ENDOBRONCHIAL ULTRASOUND  ;  Surgeon: Rigoberto Noel, MD;  Location: [place];  Service: Thoracic;  Laterality: Right;    REVIEW OF SYSTEMS:  A comprehensive review of systems was negative except for: Constitutional: positive for fatigue Musculoskeletal: positive for back pain   PHYSICAL EXAMINATION: General appearance: alert, cooperative, fatigued and no distress Head: Normocephalic, without obvious abnormality, atraumatic Neck: no adenopathy, no JVD, supple, symmetrical, trachea midline and thyroid not enlarged, symmetric, no tenderness/mass/nodules Lymph nodes: Cervical, supraclavicular, and axillary nodes normal. Resp: clear to auscultation bilaterally Back: symmetric, no curvature. ROM normal. No CVA tenderness. Cardio: regular rate and rhythm, S1, S2 normal, no murmur, click, rub or gallop GI: soft, non-tender; bowel sounds normal; no masses,  no  organomegaly Extremities: extremities normal, atraumatic, no cyanosis or edema  ECOG PERFORMANCE STATUS: 1 - Symptomatic but completely ambulatory  Blood pressure (!) 152/89, pulse 99, temperature 97.8 F (36.6 C), temperature source Temporal, resp. rate 20, height 5' 3" (1.6 m), weight 151 lb 11.2 oz (68.8 kg), last menstrual period 06/09/1998, SpO2 100 %.  LABORATORY DATA: Lab Results  Component Value Date   WBC 5.5 07/12/2019   HGB 11.0 (L) 07/12/2019   HCT 32.6 (L) 07/12/2019   MCV 100.3 (H) 07/12/2019   PLT 229 07/12/2019      Chemistry      Component Value Date/Time   NA 139 06/21/2019 0948   NA 137 06/09/2017 0901   K 3.9 06/21/2019 0948   K 3.9 06/09/2017 0901   CL 107 06/21/2019 0948   CO2 25 06/21/2019 0948   CO2 25 06/09/2017 0901   BUN 24 (H) 06/21/2019 0948   BUN 19.0 06/09/2017 0901   CREATININE 1.13 (H) 06/21/2019 0948   CREATININE 1.2 (H) 06/09/2017 0901   GLU 170 01/23/2016 0000      Component Value Date/Time   CALCIUM 8.9 06/21/2019  0948   CALCIUM 9.9 06/09/2017 0901   ALKPHOS 65 06/21/2019 0948   ALKPHOS 63 06/09/2017 0901   AST 28 06/21/2019 0948   AST 35 (H) 06/09/2017 0901   ALT 13 06/21/2019 0948   ALT 23 06/09/2017 0901   BILITOT 0.5 06/21/2019 0948   BILITOT 0.65 06/09/2017 0901       RADIOGRAPHIC STUDIES: CT Chest W Contrast  Result Date: 06/19/2019 CLINICAL DATA:  Non-small-cell lung cancer. EXAM: CT CHEST, ABDOMEN, AND PELVIS WITH CONTRAST TECHNIQUE: Multidetector CT imaging of the chest, abdomen and pelvis was performed following the standard protocol during bolus administration of intravenous contrast. CONTRAST:  162m OMNIPAQUE IOHEXOL 300 MG/ML  SOLN COMPARISON:  03/27/2019 FINDINGS: CT CHEST FINDINGS Cardiovascular: Heart size upper normal. No substantial pericardial effusion. Atherosclerotic calcification is noted in the wall of the thoracic aorta. Right Port-A-Cath tip position in the distal SVC. Mediastinum/Nodes: No mediastinal  lymphadenopathy. There is no hilar lymphadenopathy. The esophagus has normal imaging features. There is no axillary lymphadenopathy. Lungs/Pleura: Bilateral irregular pulmonary nodules again noted. Index right upper lobe nodule on 41/4 is 1.4 cm today compared to 1.2 cm previously. Index nodule superior segment right lower lobe (46/4) is 1.0 cm today compared to 1.0 cm previously. Anterior right lower lobe mass is 3.6 x 2.5 cm today (84/4) measured previously at 3.5 x 2.5. Index lingular nodule measures 1.2 cm today (65/4) compared to 1.0 cm previously. Index medial right lower lobe nodule measures 1.3 cm today (compared to 1.3 cm previously. No definite new pulmonary nodule or mass.  No pleural effusion. Musculoskeletal: Multiple sclerotic bone metastases in the thoracic spine and sternum as well as bilateral ribs are similar to prior. CT ABDOMEN PELVIS FINDINGS Hepatobiliary: No suspicious focal abnormality within the liver parenchyma. Tiny calcified gallstones evident. No intrahepatic or extrahepatic biliary dilation. Pancreas: No focal mass lesion. No dilatation of the main duct. No intraparenchymal cyst. No peripancreatic edema. Spleen: No splenomegaly. No focal mass lesion. Adrenals/Urinary Tract: No adrenal nodule or mass. Right kidney unremarkable. Left kidney is located in the pelvis. No evidence for hydroureter. The urinary bladder appears normal for the degree of distention. Stomach/Bowel: Stomach is unremarkable. No gastric wall thickening. No evidence of outlet obstruction. Duodenum does not cross the midline with small-bowel concentrated in the right abdomen and colon concentrated in the left abdomen, features compatible with intestinal malrotation. Terminal ileum unremarkable. The appendix is normal. Vascular/Lymphatic: There is abdominal aortic atherosclerosis without aneurysm. There is no gastrohepatic or hepatoduodenal ligament lymphadenopathy. No retroperitoneal or mesenteric lymphadenopathy. No  pelvic sidewall lymphadenopathy. Reproductive: Unremarkable Other: No intraperitoneal free fluid. Musculoskeletal: Scattered sclerotic bone metastases, similar to prior. IMPRESSION: 1. Similar appearance of numerous bilateral pulmonary nodules measuring stable to, in some cases, minimally larger today. No gross change and no clear trend towards progression or improvement. 2. No evidence for soft tissue metastatic disease in the abdomen or pelvis. 3. Similar appearance of numerous sclerotic bone lesions, compatible with metastatic involvement. 4. Intestinal malrotation. 5. Cholelithiasis. 6.  Aortic Atherosclerois (ICD10-170.0) Electronically Signed   By: EMisty StanleyM.D.   On: 06/19/2019 14:58   CT Abdomen Pelvis W Contrast  Result Date: 06/19/2019 CLINICAL DATA:  Non-small-cell lung cancer. EXAM: CT CHEST, ABDOMEN, AND PELVIS WITH CONTRAST TECHNIQUE: Multidetector CT imaging of the chest, abdomen and pelvis was performed following the standard protocol during bolus administration of intravenous contrast. CONTRAST:  1055mOMNIPAQUE IOHEXOL 300 MG/ML  SOLN COMPARISON:  03/27/2019 FINDINGS: CT CHEST FINDINGS Cardiovascular: Heart size upper normal.  No substantial pericardial effusion. Atherosclerotic calcification is noted in the wall of the thoracic aorta. Right Port-A-Cath tip position in the distal SVC. Mediastinum/Nodes: No mediastinal lymphadenopathy. There is no hilar lymphadenopathy. The esophagus has normal imaging features. There is no axillary lymphadenopathy. Lungs/Pleura: Bilateral irregular pulmonary nodules again noted. Index right upper lobe nodule on 41/4 is 1.4 cm today compared to 1.2 cm previously. Index nodule superior segment right lower lobe (46/4) is 1.0 cm today compared to 1.0 cm previously. Anterior right lower lobe mass is 3.6 x 2.5 cm today (84/4) measured previously at 3.5 x 2.5. Index lingular nodule measures 1.2 cm today (65/4) compared to 1.0 cm previously. Index medial right  lower lobe nodule measures 1.3 cm today (compared to 1.3 cm previously. No definite new pulmonary nodule or mass.  No pleural effusion. Musculoskeletal: Multiple sclerotic bone metastases in the thoracic spine and sternum as well as bilateral ribs are similar to prior. CT ABDOMEN PELVIS FINDINGS Hepatobiliary: No suspicious focal abnormality within the liver parenchyma. Tiny calcified gallstones evident. No intrahepatic or extrahepatic biliary dilation. Pancreas: No focal mass lesion. No dilatation of the main duct. No intraparenchymal cyst. No peripancreatic edema. Spleen: No splenomegaly. No focal mass lesion. Adrenals/Urinary Tract: No adrenal nodule or mass. Right kidney unremarkable. Left kidney is located in the pelvis. No evidence for hydroureter. The urinary bladder appears normal for the degree of distention. Stomach/Bowel: Stomach is unremarkable. No gastric wall thickening. No evidence of outlet obstruction. Duodenum does not cross the midline with small-bowel concentrated in the right abdomen and colon concentrated in the left abdomen, features compatible with intestinal malrotation. Terminal ileum unremarkable. The appendix is normal. Vascular/Lymphatic: There is abdominal aortic atherosclerosis without aneurysm. There is no gastrohepatic or hepatoduodenal ligament lymphadenopathy. No retroperitoneal or mesenteric lymphadenopathy. No pelvic sidewall lymphadenopathy. Reproductive: Unremarkable Other: No intraperitoneal free fluid. Musculoskeletal: Scattered sclerotic bone metastases, similar to prior. IMPRESSION: 1. Similar appearance of numerous bilateral pulmonary nodules measuring stable to, in some cases, minimally larger today. No gross change and no clear trend towards progression or improvement. 2. No evidence for soft tissue metastatic disease in the abdomen or pelvis. 3. Similar appearance of numerous sclerotic bone lesions, compatible with metastatic involvement. 4. Intestinal malrotation. 5.  Cholelithiasis. 6.  Aortic Atherosclerois (ICD10-170.0) Electronically Signed   By: Misty Stanley M.D.   On: 06/19/2019 14:58    ASSESSMENT AND PLAN:  This is a very pleasant 74 years old Hispanic female with metastatic non-small cell lung cancer, adenocarcinoma status post induction systemic chemotherapy with carboplatin and Alimta and she is currently on maintenance treatment with single agent Alimta status post 57 cycles. The patient has been tolerating this treatment well with no concerning adverse effects. I recommended for her to proceed with cycle #58 today as planned. I will see her back for follow-up visit in 3 weeks for evaluation before the next cycle of her treatment. For the back pain, she will continue her current treatment with Mobic and Lyrica. The patient was advised to call immediately if she has any concerning symptoms in the interval. The patient voices understanding of current disease status and treatment options and is in agreement with the current care plan. All questions were answered. The patient knows to call the clinic with any problems, questions or concerns. We can certainly see the patient much sooner if necessary.  Disclaimer: This note was dictated with voice recognition software. Similar sounding words can inadvertently be transcribed and may not be corrected upon review.

## 2019-07-25 ENCOUNTER — Ambulatory Visit: Payer: Medicare Other | Attending: Internal Medicine

## 2019-07-25 DIAGNOSIS — Z20822 Contact with and (suspected) exposure to covid-19: Secondary | ICD-10-CM | POA: Diagnosis not present

## 2019-07-26 LAB — NOVEL CORONAVIRUS, NAA: SARS-CoV-2, NAA: NOT DETECTED

## 2019-08-01 NOTE — Progress Notes (Signed)
[place], MD [address] [place] [address]  DIAGNOSIS: Stage IV (T1b, N2, M1b) non-small cell lung cancer, adenocarcinoma diagnosed in March 2017 and presented with right lower lobe lung nodule in addition to mediastinal lymphadenopathy and metastatic bone lesions.  Molecular studies:PDL 1 TPS <1%.   [employer] Studies: Positive for ERBB2 [id number]. Negative for EGFR, KRAS, ALK, BRAF, MET, RET and ROS1.  PRIOR THERAPY: 1) Induction systemic chemotherapy with carboplatin for AUC of 5 and Alimta 500 MG/M2 every 3 weeks is status post 6 cycles at the [facility], last dose was given 03/05/2016 with stable disease. 2) palliative radiation to the metastatic bone disease in the lower back and pelvic area.  CURRENT THERAPY: 1) Maintenance systemic chemotherapy with Alimta 500 MG/M2 every 3 weeks status post58 cycles. First dose was given 03/25/2016.  2) Zometa 4 mg IV every 12 week for metastatic bone disease.  INTERVAL HISTORY: Valerie Long 74 y.o. female returns to the clinic for a follow up visit. The patient is feeling well today without any concerning complaints except for persistent bilateral lower extremity swelling. Her swelling improves in the morning when she wakes up but worsens throughout the day. She has a challenging time putting her compression stockings on. We discussed ways to get these on easier. She denies any fever, nasal congestion, chills, sore throats, myalgias, chest pain, or shortness of breath. She had a cough two weeks ago that has resolved at this time. The patient continues to tolerate treatment withsystemic chemotherapy with Alimtawell without any adverse sideeffects except facial flushing from her steroid pre-medication. Denies anynight sweats orweight loss. Denies any hemoptysis. Denies any nausea, vomiting, diarrhea, or constipation. Denies any headache or visual  changes.She is here today for evaluation before starting cycle #59  MEDICAL HISTORY: Past Medical History:  Diagnosis Date  . Adenocarcinoma of right lung, stage 4 ([place]) 2017  . Anemia   . Arthritis   . Bone metastases ([place]) 02/24/2017  . Encounter for antineoplastic chemotherapy 03/17/2016  . Hypercholesterolemia   . Hypertension 06/18/2016  . Osteopenia   . 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 Pneumonia   . Shortness of breath dyspnea     ALLERGIES:  has No Known Allergies.  MEDICATIONS:  Current Outpatient Medications  Medication Sig Dispense Refill  . acetaminophen (TYLENOL) 500 MG tablet Take 1,000 mg by mouth every 6 (six) hours as needed for moderate pain or headache.    . baclofen (LIORESAL) 10 MG tablet Take 1 tablet (10 mg total) by mouth 3 (three) times daily. (Patient not taking: Reported on 03/28/2019) 30 each 0  . Calcium Carb-Cholecalciferol (CALCIUM 600/VITAMIN D3 PO) Take by mouth.    . Cyanocobalamin (B-12 PO) Take 2 tablets by mouth daily.    Marland Kitchen dexamethasone (DECADRON) 4 MG tablet Take 58m by mouth twice daily the day before, of, and after chemo 40 tablet 1  . Fish Oil-Cholecalciferol (OMEGA-3 + VITAMIN D3 PO) Take 1 tablet by mouth daily.    . folic acid (FOLVITE) 1 MG tablet Take 1 tablet (1 mg total) by mouth daily. 90 tablet 0  . lidocaine-prilocaine (EMLA) cream Apply 1 application topically as needed. Apply 1-2 tsp over port site 1-2 hours prior to chemo. 30 g 0  . meloxicam (MOBIC) 15 MG tablet TAKE 1 TABLET BY MOUTH EVERY DAY AS NEEDED FOR PAIN (Patient not taking: Reported on 05/31/2019)  30 tablet 0  . ondansetron (ZOFRAN) 8 MG tablet Take 8 mg by mouth every 8 (eight) hours as needed.    Marland Kitchen OVER THE COUNTER MEDICATION Apply 1 drop topically daily as needed. 1 drop CBD oil in vicks vapor rub . Applied to right thigh for pain relief.    . pregabalin (LYRICA) 75 MG capsule 1 tablet daily for 10 days, then 1 tablet twice a day 60 capsule 3   . PRESCRIPTION MEDICATION Inject 1 Dose as directed every 21 ( twenty-one) days. Chemo    . prochlorperazine (COMPAZINE) 10 MG tablet Take 1 tablet (10 mg total) by mouth every 6 (six) hours as needed for nausea or vomiting. (Patient not taking: Reported on 05/31/2019) 30 tablet 0   No current facility-administered medications for this visit.    SURGICAL HISTORY:  Past Surgical History:  Procedure Laterality Date  . CESAREAN SECTION     myomectomy  . COLONOSCOPY W/ POLYPECTOMY    . IR GENERIC HISTORICAL  08/17/2016   IR US GUIDE VASC ACCESS RIGHT 08/17/2016 [name], MD [facility]  . IR GENERIC HISTORICAL  08/17/2016   IR FLUORO GUIDE PORT INSERTION RIGHT 08/17/2016 [name], MD [facility]  . MEDIASTINOSCOPY N/A 09/25/2015   Procedure: MEDIASTINOSCOPY;  Surgeon: Melrose Nakayama, MD;  Location: [place];  Service: Thoracic;  Laterality: N/A;  . VIDEO BRONCHOSCOPY Bilateral 08/19/2015   Procedure: VIDEO BRONCHOSCOPY WITH FLUORO;  Surgeon: Rigoberto Noel, MD;  Location: [facility];  Service: Cardiopulmonary;  Laterality: Bilateral;  . VIDEO BRONCHOSCOPY N/A 09/08/2015   Procedure: VIDEO BRONCHOSCOPY WITH FLUORO;  Surgeon: Rigoberto Noel, MD;  Location: [facility];  Service: Thoracic;  Laterality: N/A;  . VIDEO BRONCHOSCOPY WITH ENDOBRONCHIAL ULTRASOUND Right 09/08/2015   Procedure: ATTEMPTED VIDEO BRONCHOSCOPY ENDOBRONCHIAL ULTRASOUND  ;  Surgeon: Rigoberto Noel, MD;  Location: [facility];  Service: Thoracic;  Laterality: Right;    REVIEW OF SYSTEMS:   Review of Systems  Constitutional: Negative for appetite change, chills, fatigue, fever and unexpected weight change.  HENT: Negative for mouth sores, nosebleeds, sore throat and trouble swallowing.   Eyes: Negative for eye problems and icterus.  Respiratory: Negative for cough, hemoptysis, shortness of breath and wheezing.   Cardiovascular: Positive for bilateral lower extremity swelling. Negative for chest pain.  Gastrointestinal:  Negative for abdominal pain, constipation, diarrhea, nausea and vomiting.  Genitourinary: Negative for bladder incontinence, difficulty urinating, dysuria, frequency and hematuria.   Musculoskeletal: Negative for back pain, neck pain and neck stiffness.  Skin: Negative for itching and rash.  Neurological: Negative for dizziness, extremity weakness, headaches, light-headedness and seizures. Ambulates with a cane. Hematological: Negative for adenopathy. Does not bruise/bleed easily.  Psychiatric/Behavioral: Negative for confusion, depression and sleep disturbance. The patient is not nervous/anxious.     PHYSICAL EXAMINATION:  Last menstrual period 06/09/1998.  ECOG PERFORMANCE STATUS: 1 - Symptomatic but completely ambulatory  Physical Exam  Constitutional: Oriented to person, place, and time and well-developed, well-nourished, and in no distress.  HENT:  Head: Normocephalic and atraumatic.  Mouth/Throat: Oropharynx is clear and moist. No oropharyngeal exudate.  Eyes: Conjunctivae are normal. Right eye exhibits no discharge. Left eye exhibits no discharge. No scleral icterus.  Neck: Normal range of motion. Neck supple.  Cardiovascular: Normal rate, regular rhythm, normal heart sounds and intact distal pulses.   Pulmonary/Chest: Effort normal and breath sounds normal. No respiratory distress. No wheezes. No rales.  Abdominal: Soft. Bowel sounds are normal. Exhibits no distension and no mass. There  is no tenderness.  Musculoskeletal: Bilateral lower extremity pitting edema. Normal range of motion.   Lymphadenopathy:    No cervical adenopathy.  Neurological: Alert and oriented to person, place, and time. Exhibits normal muscle tone. Gait normal. Coordination normal.  Skin: facial flushing. Skin is warm and dry. No rash noted. Not diaphoretic. No erythema. No pallor.  Psychiatric: Mood, memory and judgment normal.  Vitals reviewed.  LABORATORY DATA: Lab Results  Component Value Date    WBC 5.5 07/12/2019   HGB 11.0 (L) 07/12/2019   HCT 32.6 (L) 07/12/2019   MCV 100.3 (H) 07/12/2019   PLT 229 07/12/2019      Chemistry      Component Value Date/Time   NA 138 07/12/2019 0910   NA 137 06/09/2017 0901   K 4.1 07/12/2019 0910   K 3.9 06/09/2017 0901   CL 106 07/12/2019 0910   CO2 24 07/12/2019 0910   CO2 25 06/09/2017 0901   BUN 23 07/12/2019 0910   BUN 19.0 06/09/2017 0901   CREATININE 0.95 07/12/2019 0910   CREATININE 1.2 (H) 06/09/2017 0901   GLU 170 01/23/2016 0000      Component Value Date/Time   CALCIUM 9.4 07/12/2019 0910   CALCIUM 9.9 06/09/2017 0901   ALKPHOS 59 07/12/2019 0910   ALKPHOS 63 06/09/2017 0901   AST 32 07/12/2019 0910   AST 35 (H) 06/09/2017 0901   ALT 17 07/12/2019 0910   ALT 23 06/09/2017 0901   BILITOT 0.6 07/12/2019 0910   BILITOT 0.65 06/09/2017 0901       RADIOGRAPHIC STUDIES:  No results found.   ASSESSMENT/PLAN:  This is a very pleasant 74 year old Hispanic female diagnosed with metastatic non-small cell lung cancer, adenocarcinoma. She presented with a right lower lobe lung nodule in addition to mediastinal lymphadenopathy and metastatic bone lesions. She was diagnosed in March 2017. Her PDL 1 expression is <1% and she has no actionable mutations.  She previously underwent inductiontreatmentwith systemic chemotherapy withCarboplatin and Alimta. She is currently undergoing maintenance therapy withAlimta. She is status post58cycles. The patient has been tolerating this treatment fairly well without any concerning adverse side effects.  Labs were reviewed. I recommend that she proceed with cycle #59 today as scheduled.   We will see her back for a follow up visit in 3 weeks for evaluation before starting cycle #60.   She will continue to follow with Dr. Posey Pronto, her neurologist for her back pain.   Discussed strategies to improve her leg swelling and techniques to help get her compression stockings on easier.  She will continue to elevate her legs when able.   Discussed that we encourage her to receive her COVID-19 vaccine.   The patient was advised to call immediately if she has any concerning symptoms in the interval. The patient voices understanding of current disease status and treatment options and is in agreement with the current care plan. All questions were answered. The patient knows to call the clinic with any problems, questions or concerns. We can certainly see the patient much sooner if necessary    No orders of the defined types were placed in this encounter.    Kace Hartje L Atilla Zollner, PA-C 08/01/19

## 2019-08-02 ENCOUNTER — Inpatient Hospital Stay: Payer: Medicare Other

## 2019-08-02 ENCOUNTER — Other Ambulatory Visit: Payer: Self-pay

## 2019-08-02 ENCOUNTER — Inpatient Hospital Stay (HOSPITAL_BASED_OUTPATIENT_CLINIC_OR_DEPARTMENT_OTHER): Payer: Medicare Other | Admitting: Physician Assistant

## 2019-08-02 VITALS — BP 157/76 | HR 65 | Temp 98.3°F | Resp 17 | Ht 63.0 in | Wt 155.4 lb

## 2019-08-02 DIAGNOSIS — E7800 Pure hypercholesterolemia, unspecified: Secondary | ICD-10-CM | POA: Diagnosis not present

## 2019-08-02 DIAGNOSIS — C3491 Malignant neoplasm of unspecified part of right bronchus or lung: Secondary | ICD-10-CM | POA: Diagnosis not present

## 2019-08-02 DIAGNOSIS — M8580 Other specified disorders of bone density and structure, unspecified site: Secondary | ICD-10-CM | POA: Diagnosis not present

## 2019-08-02 DIAGNOSIS — Z5111 Encounter for antineoplastic chemotherapy: Secondary | ICD-10-CM

## 2019-08-02 DIAGNOSIS — C3431 Malignant neoplasm of lower lobe, right bronchus or lung: Secondary | ICD-10-CM | POA: Diagnosis not present

## 2019-08-02 DIAGNOSIS — Z95828 Presence of other vascular implants and grafts: Secondary | ICD-10-CM

## 2019-08-02 DIAGNOSIS — I10 Essential (primary) hypertension: Secondary | ICD-10-CM | POA: Diagnosis not present

## 2019-08-02 DIAGNOSIS — C7951 Secondary malignant neoplasm of bone: Secondary | ICD-10-CM | POA: Diagnosis not present

## 2019-08-02 LAB — CBC WITH DIFFERENTIAL (CANCER CENTER ONLY)
Abs Immature Granulocytes: 0.02 10*3/uL (ref 0.00–0.07)
Basophils Absolute: 0 10*3/uL (ref 0.0–0.1)
Basophils Relative: 0 %
Eosinophils Absolute: 0 10*3/uL (ref 0.0–0.5)
Eosinophils Relative: 0 %
HCT: 31.5 % — ABNORMAL LOW (ref 36.0–46.0)
Hemoglobin: 10.4 g/dL — ABNORMAL LOW (ref 12.0–15.0)
Immature Granulocytes: 0 %
Lymphocytes Relative: 10 %
Lymphs Abs: 0.7 10*3/uL (ref 0.7–4.0)
MCH: 34.2 pg — ABNORMAL HIGH (ref 26.0–34.0)
MCHC: 33 g/dL (ref 30.0–36.0)
MCV: 103.6 fL — ABNORMAL HIGH (ref 80.0–100.0)
Monocytes Absolute: 0.3 10*3/uL (ref 0.1–1.0)
Monocytes Relative: 5 %
Neutro Abs: 5.5 10*3/uL (ref 1.7–7.7)
Neutrophils Relative %: 85 %
Platelet Count: 264 10*3/uL (ref 150–400)
RBC: 3.04 MIL/uL — ABNORMAL LOW (ref 3.87–5.11)
RDW: 14.5 % (ref 11.5–15.5)
WBC Count: 6.5 10*3/uL (ref 4.0–10.5)
nRBC: 0 % (ref 0.0–0.2)

## 2019-08-02 LAB — CMP (CANCER CENTER ONLY)
ALT: 17 U/L (ref 0–44)
AST: 33 U/L (ref 15–41)
Albumin: 3.5 g/dL (ref 3.5–5.0)
Alkaline Phosphatase: 66 U/L (ref 38–126)
Anion gap: 9 (ref 5–15)
BUN: 22 mg/dL (ref 8–23)
CO2: 25 mmol/L (ref 22–32)
Calcium: 9.1 mg/dL (ref 8.9–10.3)
Chloride: 106 mmol/L (ref 98–111)
Creatinine: 0.95 mg/dL (ref 0.44–1.00)
GFR, Est AFR Am: >60 mL/min (ref >60)
GFR, Estimated: 60 mL/min — ABNORMAL LOW (ref >60)
Glucose, Bld: 94 mg/dL (ref 70–99)
Potassium: 4.2 mmol/L (ref 3.5–5.1)
Sodium: 140 mmol/L (ref 135–145)
Total Bilirubin: 0.4 mg/dL (ref 0.3–1.2)
Total Protein: 7.2 g/dL (ref 6.5–8.1)

## 2019-08-02 MED ORDER — PROCHLORPERAZINE MALEATE 10 MG PO TABS
10.0000 mg | ORAL_TABLET | Freq: Once | ORAL | Status: AC
  Administered 2019-08-02: 10 mg via ORAL

## 2019-08-02 MED ORDER — HEPARIN SOD (PORK) LOCK FLUSH 100 UNIT/ML IV SOLN
500.0000 [IU] | Freq: Once | INTRAVENOUS | Status: AC | PRN
  Administered 2019-08-02: 500 [IU]
  Filled 2019-08-02: qty 5

## 2019-08-02 MED ORDER — SODIUM CHLORIDE 0.9 % IV SOLN
500.0000 mg/m2 | Freq: Once | INTRAVENOUS | Status: AC
  Administered 2019-08-02: 900 mg via INTRAVENOUS
  Filled 2019-08-02: qty 20

## 2019-08-02 MED ORDER — SODIUM CHLORIDE 0.9% FLUSH
10.0000 mL | INTRAVENOUS | Status: DC | PRN
  Administered 2019-08-02: 10 mL
  Filled 2019-08-02: qty 10

## 2019-08-02 MED ORDER — PROCHLORPERAZINE MALEATE 10 MG PO TABS
ORAL_TABLET | ORAL | Status: AC
  Filled 2019-08-02: qty 1

## 2019-08-02 MED ORDER — SODIUM CHLORIDE 0.9 % IV SOLN
Freq: Once | INTRAVENOUS | Status: AC
  Filled 2019-08-02: qty 250

## 2019-08-02 MED ORDER — SODIUM CHLORIDE 0.9% FLUSH
10.0000 mL | Freq: Once | INTRAVENOUS | Status: AC | PRN
  Administered 2019-08-02: 10 mL
  Filled 2019-08-02: qty 10

## 2019-08-02 NOTE — Patient Instructions (Signed)
[facility] Discharge Instructions for Patients Receiving Chemotherapy  Today you received the following chemotherapy agents: Pemetrexed (Alimta)   To help prevent nausea and vomiting after your treatment, we encourage you to take your nausea medication as directed.    If you develop nausea and vomiting that is not controlled by your nausea medication, call the clinic.   BELOW ARE SYMPTOMS THAT SHOULD BE REPORTED IMMEDIATELY:  *FEVER GREATER THAN 100.5 F  *CHILLS WITH OR WITHOUT FEVER  NAUSEA AND VOMITING THAT IS NOT CONTROLLED WITH YOUR NAUSEA MEDICATION  *UNUSUAL SHORTNESS OF BREATH  *UNUSUAL BRUISING OR BLEEDING  TENDERNESS IN MOUTH AND THROAT WITH OR WITHOUT PRESENCE OF ULCERS  *URINARY PROBLEMS  *BOWEL PROBLEMS  UNUSUAL RASH Items with * indicate a potential emergency and should be followed up as soon as possible.  Feel free to call the clinic should you have any questions or concerns. The clinic phone number is [phone].  Please show the [place] at check-in to the Emergency Department and triage nurse.  Zoledronic Acid injection (Hypercalcemia, Oncology) O que  este medicamento? O CIDO ZOLEDRNICO diminui a quantidade da perda de [employer].  usado para tratar o excesso de clcio no sangue provocado pelo cncer. Tambm  usado para prevenir as complicaes de cnceres que tenham se espalhado para os ossos. Este medicamento pode ser usado para outros propsitos; em caso de dvidas, pergunte ao seu profissional de sade ou farmacutico. NOMES DE MARCAS COMUNS: Zometa O que devo dizer a meu profissional de sade antes de tomar este medicamento? Precisam saber se voc tem algum dos seguintes problemas ou estados de sade:  asma induzida por aspirina  cncer, principalmente se estiver tomando medicamentos usados para o tratamento de cncer  doenas dentrias ou uso de dentaduras  infeco  doenas renais  recebendo  corticoides, como dexametasona ou prednisona  reao estranha ou alergia ao cido zoledrnico  reao estranha ou alergia a outros medicamentos  reao estranha ou alergia a alimentos, corantes ou conservantes  est grvida ou tentando engravidar  est amamentando Como devo usar este medicamento? Este medicamento  para infuso intravenosa. Este medicamento  administrado por um profissional da sade no hospital ou em consultrio. Fale com seu pediatra a respeito do uso deste medicamento em crianas. Pode ser preciso tomar alguns cuidados especiais. Superdosagem: Se achar que tomou uma superdosagem deste medicamento, entre em contato imediatamente com o [facility] de Intoxicaes ou v a [employer]. OBSERVAO: Este medicamento  s para voc. No compartilhe este medicamento com outras pessoas. E se eu deixar de tomar uma dose?  importante que voc no perca nenhuma dose. Se no puder comparecer a uma consulta, entre em contato com seu mdico ou profissional de sade. O que pode interagir com este medicamento?  alguns antibiticos aplicados como injeo  AINEs, medicamentos analgsicos e anti-inflamatrios, como ibuprofeno, naproxeno  alguns diurticos, como bumetanida, furosemida  teriparatida  talidomida Esta lista pode no descrever todas as interaes possveis. D ao seu profissional de sade uma lista de todos os medicamentos, ervas medicinais, remdios de venda livre, ou suplementos alimentares que voc [place]. Diga tambm se voc fuma, bebe, ou [place] drogas ilcitas. Alguns destes podem interagir com o seu medicamento. Ao que devo ficar atento quando estiver [employer] medicamento? Consulte seu mdico ou profissional de sade para acompanhamento regular Museum/gallery curator. Os benefcios deste medicamento podem demorar algum tempo para aparecer. No pare de usar [employer] a menos que seu mdico [place].  Seu mdico pode pedir exames de sangue e outros exames para  monitorar a sua evoluo. [place] que queiram engravidar ou que acreditem que possam estar grvidas devem avisar aos seus mdicos. H risco de graves efeitos colaterais no feto. Para mais informaes, fale com seu mdico, profissional de sade ou farmacutico. Voc deve consumir clcio e vitamina D em quantidade suficiente enquanto estiver [employer]. [place] com seu mdico ou profissional de sade a respeito dos alimentos que consome e das vitaminas que toma. Algumas pessoas que tomam este medicamento sofrem fortes dores nos ossos, articulaes ou msculos. Este medicamento tambm pode aumentar os riscos de problemas no maxilar ou de uma fratura do fmur. Informe o seu mdico imediatamente se sentir dores fortes no maxilar, ossos, articulaes ou msculos. Avise seu mdico se tiver uma dor que no passa ou que piora. Informe ao seu cirurgio-dentista que est tomando este medicamento. Voc no deve passar por nenhuma cirurgia dental de grande porte enquanto tomar [employer]. Consulte o seu dentista para fazer uma avaliao odontolgica e corrigir quaisquer problemas dentrios antes de comear a Systems developer. Mantenha uma boa higiene oral enquanto tomar [employer]a. Assegure-se de que far consultas de acompanhamento odontolgico regularmente. Que efeitos colaterais posso sentir aps usar este medicamento? Efeitos colaterais que devem ser informados ao seu mdico ou profissional de sade o mais rpido possvel:  reaes alrgicas, como erupo na pele, coceira, urticria, ou inchao do rosto, dos lbios ou da lngua  [place], confuso ou depresso  dificuldade para respirar  alteraes na viso  dor nos olhos  sensao de [place], [place], quedas  dor na mandbula ou maxilar, principalmente depois de um procedimento odontolgico  aftas  cibras, rigidez ou fraqueza muscular  vermelhido, bolhas, descamao ou afrouxamento da pele, inclusive dentro da  boca  dificuldade para urinar ou alterao na quantidade de urina Efeitos colaterais que normalmente no precisam de cuidados mdicos (avise ao seu mdico ou profissional de sade se persistirem ou forem incmodos):  dor nos ossos, articulaes ou msculos  priso de ventre  diarreia  febre  queda de cabelo  irritao no local da injeo  perda de apetite  enjoo ou vmitos  desarranjo estomacal  dificuldade para dormir  dificuldade para engolir  fraqueza ou cansao Esta lista pode no descrever todos os efeitos colaterais possveis. Para mais orientaes sobre efeitos colaterais, consulte o seu mdico. Voc pode relatar a ocorrncia de efeitos colaterais  FDA pelo telefone [phone]. Onde devo guardar meu medicamento? Este medicamento  administrado por um profissional da sade no hospital ou em consultrio. Voc no receber este medicamento para conservao em casa. OBSERVAO: Este folheto  um resumo. Pode no cobrir todas as informaes possveis. Se tiver dvidas a respeito deste medicamento, fale com seu mdico, farmacutico ou profissional de sade.  2020 Elsevier/Gold Standard (2016-06-24 00:00:00)  Coronavirus (COVID-19) Are you at risk?  Are you at risk for the Coronavirus (COVID-19)?  To be considered HIGH RISK for Coronavirus (COVID-19), you have to meet the following criteria:  . Traveled to [place], [place], [place], [place] or [place]; or in the [place] to [place], [place], [place], or [place]; and have fever, cough, and shortness of breath within the last 2 weeks of travel OR . Been in close contact with a person diagnosed with COVID-19 within the last 2 weeks and have fever, cough, and shortness of breath . IF YOU DO NOT MEET THESE CRITERIA, YOU ARE CONSIDERED LOW RISK FOR COVID-19.  What to do  if you are HIGH RISK for COVID-19?  Marland Kitchen If you are having a medical emergency, call 911. . Seek medical care right away. Before you go to a  doctor's office, urgent care or emergency department, call ahead and tell them about your recent travel, contact with someone diagnosed with COVID-19, and your symptoms. You should receive instructions from your physician's office regarding next steps of care.  . When you arrive at healthcare provider, tell the healthcare staff immediately you have returned from visiting [place], [place], [place], [place] or [place]; or traveled in the [place] to [place], [place], [place], or [place]; in the last two weeks or you have been in close contact with a person diagnosed with COVID-19 in the last 2 weeks.   . Tell the health care staff about your symptoms: fever, cough and shortness of breath. . After you have been seen by a medical provider, you will be either: o Tested for (COVID-19) and discharged home on quarantine except to seek medical care if symptoms worsen, and asked to  - Stay home and avoid contact with others until you get your results (4-5 days)  - Avoid travel on public transportation if possible (such as bus, train, or airplane) or o Sent to the Emergency Department by EMS for evaluation, COVID-19 testing, and possible admission depending on your condition and test results.  What to do if you are LOW RISK for COVID-19?  Reduce your risk of any infection by using the same precautions used for avoiding the common cold or flu:  Marland Kitchen Wash your hands often with soap and warm water for at least 20 seconds.  If soap and water are not readily available, use an alcohol-based hand sanitizer with at least 60% alcohol.  . If coughing or sneezing, cover your mouth and nose by coughing or sneezing into the elbow areas of your shirt or coat, into a tissue or into your sleeve (not your hands). . Avoid shaking hands with others and consider head nods or verbal greetings only. . Avoid touching your eyes, nose, or mouth with unwashed hands.  . Avoid close contact with people who are sick. . Avoid  places or events with large numbers of people in one location, like concerts or sporting events. . Carefully consider travel plans you have or are making. . If you are planning any travel outside or inside the [place], visit the CDC's Travelers' Health webpage for the latest health notices. . If you have some symptoms but not all symptoms, continue to monitor at home and seek medical attention if your symptoms worsen. . If you are having a medical emergency, call 911.   [place] / e-Visit: [email or web address]         [facility]: [phone]  [facility]: [phone]                   [facility]: [phone]   Zoledronic Acid injection (Hypercalcemia, Oncology) Sander Nephew es este medicamento? El CIDO ZOLEDRNICO reduce la prdida del calcio de los [place]. Se utiliza para tratar el calcio exceso en la sangre causado por el cncer. Se utiliza tambin para prevenir complicaciones de un cncer que se haya extendido a los huesos. Este medicamento puede ser utilizado para otros usos; si tiene alguna pregunta consulte con su proveedor de atencin mdica o con su farmacutico. MARCAS COMUNES: Zometa Qu le debo informar a mi profesional de Technical sales engineer  antes de tomar este medicamento? Necesita saber si usted presenta alguno de los [employer] o situaciones:  asma sensible a la aspirina  cncer, especialmente si est recibiendo medicamentos usados para tratar el cncer  enfermedad dental o [place] dentadura postiza  infeccin  enfermedad renal  recibiendo corticosteroides como dexametasona o prednisona  una reaccin alrgica o inusual al cido zoledrnico, a otros medicamentos, alimentos, colorantes o conservantes  si est embarazada o buscando quedar embarazada  si est amamantando a un beb Cmo debo utilizar este medicamento? Este medicamento se  administra mediante infusin por va intravenosa. Lo administra un profesional de Technical sales engineer en un hospital o en un entorno clnico. Hable con su pediatra para informarse acerca del uso de este medicamento en nios. Puede requerir atencin especial. Sobredosis: Pngase en contacto inmediatamente con un centro toxicolgico o una sala de urgencia si usted cree que haya tomado demasiado medicamento. ATENCIN: [employer] es solo para usted. No comparta este medicamento con nadie. Qu sucede si me olvido de una dosis? Es importante no olvidar ninguna dosis. Informe a su mdico o a su profesional de la salud si no puede asistir a Photographer. Qu puede interactuar con este medicamento?  ciertos antibiticos administrados por [employer], medicamentos para Conservation officer, historic buildings o inflamacin, como ibuprofeno o naproxeno  ciertos diurticos bumetanida o furosemida  teriparatida  talidomida Puede ser que esta lista no menciona todas las posibles interacciones. Informe a su profesional de [employer]	[place] de [employer] productos a base de hierbas, medicamentos de [place] o suplementos nutritivos que est tomando. Si usted fuma, consume bebidas alcohlicas o si utiliza drogas ilegales, indqueselo tambin a su profesional de [employer] Home	[place]. Algunas sustancias pueden interactuar con su medicamento. A qu debo estar atento al usar Coca-Cola? Visite a su mdico o a su profesional de la salud para chequeos peridicos. Puede ser necesario que transcurra cierto tiempo antes de que pueda observar los beneficios de [place]. No deje de tomar su medicamento excepto si as lo indica su mdico. Su mdico puede pedirle anlisis de [place] u otras pruebas para Chief Technology Officer su evolucin. Las mujeres deben informar a su mdico si estn buscando quedar embarazadas o si creen que estn embarazadas. Existe la posibilidad de efectos secundarios graves a un beb sin nacer. Para ms informacin hable con su profesional de la  salud o su farmacutico. Debe asegurarse de que usted reciba la cantidad adecuado de calcio y vitamina D mientras recibe este medicamento. Consulte a su profesional de [employer] alimentos y vitamina que toma. Algunas personas que toman este medicamento experimentan dolor grave de [place], de articulaciones o/y de msculos. Este medicamento tambin puede aumentar el riesgo de problemas de la [place] o un fmur roto. Informe a su mdico inmediatamente si tiene dolor grave en la mandbula, huesos, articulaciones o msculos. Si experimenta dolor que no desaparece o que empeora, informe a su mdico. Informa a su dentista y [place] dental de que usted est [employer]. No debe tener Office manager que est usando este medicamento. Consulte al dentista para hacerse un examen dental y arreglase cualquier problema dental antes de comenzar con este medicamento. Cuide sus dientes mientras est [employer]. Asegrese de visitar a su dentista para citas de control regularmente. Qu efectos secundarios puedo tener al [employer] este medicamento? Efectos secundarios que debe informar a su mdico o a Barrister's clerk de la salud tan pronto como sea posible: Chief of Staff, como erupcin cutnea, comezn/picazn  o urticarias, e hinchazn de la cara, los labios o la lengua anxiedad, confusin o depresin problemas respiratorios cambios en la visin dolor ocular sensacin de desmayos o aturdimiento, cadas dolor de la mandbula, especialmente despus de tratamiento odontolgico llagas en la boca calambres, rigidez o debilidad muscular enrojecimiento, formacin de ampollas, descamacin o distensin de la piel, incluso dentro de la boca dificultad para orinar o cambios en el volumen de orina Efectos secundarios que generalmente no requieren atencin mdica (infrmelos a su mdico o a Barrister's clerk de la salud si persisten o si son molestos): dolores en los [place], las  articulaciones o los msculos estreimiento diarrea fiebre cada del cabello irritacin en el lugar de la inyeccin prdida del apetito nuseas, vmito Higher education careers adviser dificultad para conciliar el sueo problemas para tragar debilidad o cansancio Puede ser que esta lista no menciona todos los posibles efectos secundarios. Comunquese a su mdico por asesoramiento mdico [employer]. Usted puede informar los efectos secundarios a la FDA por telfono al 1-800-FDA-1088. Dnde debo guardar mi medicina? Este medicamento se administra en hospitales o clnicas y no necesitar guardarlo en su domicilio. ATENCIN: Este folleto es un resumen. Puede ser que no cubra toda la posible informacin. Si usted tiene preguntas acerca de esta medicina, consulte con su mdico, su farmacutico o su profesional de Technical sales engineer.  2020 Elsevier/Gold Standard (2016-06-24 00:00:00)

## 2019-08-03 ENCOUNTER — Telehealth: Payer: Self-pay | Admitting: Physician Assistant

## 2019-08-03 NOTE — Telephone Encounter (Signed)
Scheduled appts per 2/25 los. Pt to get updated appt calender at next appt

## 2019-08-13 ENCOUNTER — Other Ambulatory Visit: Payer: Self-pay | Admitting: Internal Medicine

## 2019-08-13 DIAGNOSIS — C3491 Malignant neoplasm of unspecified part of right bronchus or lung: Secondary | ICD-10-CM

## 2019-08-15 ENCOUNTER — Telehealth: Payer: Self-pay | Admitting: Internal Medicine

## 2019-08-15 NOTE — Telephone Encounter (Signed)
Please advise whether patient is should take covid vaccine due to medication condition.

## 2019-08-15 NOTE — Telephone Encounter (Signed)
Yes, I recommend her to get the Covid immunization.

## 2019-08-15 NOTE — Telephone Encounter (Signed)
Spoke w/ Pt- informed of recommendations. Pt verbalized understanding.  

## 2019-08-15 NOTE — Telephone Encounter (Signed)
Please advise 

## 2019-08-22 NOTE — Progress Notes (Signed)
[place], MD [address] [place] [address]  DIAGNOSIS: Stage IV (T1b, N2, M1b) non-small cell lung cancer, adenocarcinoma diagnosed in March 2017 and presented with right lower lobe lung nodule in addition to mediastinal lymphadenopathy and metastatic bone lesions.  Molecular studies:PDL 1 TPS <1%.   [facility] One Studies: Positive for ERBB2 [id number]. Negative for EGFR, KRAS, ALK, BRAF, MET, RET and ROS1.  PRIOR THERAPY: 1) Induction systemic chemotherapy with carboplatin for AUC of 5 and Alimta 500 MG/M2 every 3 weeks is status post 6 cycles at the [facility], last dose was given 03/05/2016 with stable disease. 2) palliative radiation to the metastatic bone disease in the lower back and pelvic area.  CURRENT THERAPY: 1) Maintenance systemic chemotherapy with Alimta 500 MG/M2 every 3 weeks status post59 cycles. First dose was given 03/25/2016.  2) Zometa 4 mg IV every 12 week for metastatic bone disease.  INTERVAL HISTORY: Valerie Long 74 y.o. female returns to the clinic for a follow up visit. The patient is feeling well today without any concerning complaints except she states she has been coughing more in the interval since her last appointment. She started taking tessalon last night which helps. Her cough produces white phlegm. Denies sick contacts, fevers, chills, sore throat, or shortness of breath except while coughing. She also mentions a mild wave like sensation in her upper anterior abdomen when she lays down at night that moves right to left. She states this resolves on its own without any intervention. She states it does not "last that long". Denies associated symptoms. The patient continues to tolerate treatment withsystemic chemotherapy with Alimtawell without any adverse sideeffects except facial flushing from her steroid pre-medication and she felt fatigued for several days  after her last infusion. Denies anynight sweats orweight loss. Denies any hemoptysis. Denies any nausea, vomiting, diarrhea, or constipation. Denies any headache or visual changes.She is here today for evaluation before starting cycle #60.   MEDICAL HISTORY: Past Medical History:  Diagnosis Date  . Adenocarcinoma of right lung, stage 4 ([place]) 2017  . Anemia   . Arthritis   . Bone metastases ([place]) 02/24/2017  . Encounter for antineoplastic chemotherapy 03/17/2016  . Hypercholesterolemia   . Hypertension 06/18/2016  . Osteopenia   . 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 Pneumonia   . Shortness of breath dyspnea     ALLERGIES:  has No Known Allergies.  MEDICATIONS:  Current Outpatient Medications  Medication Sig Dispense Refill  . acetaminophen (TYLENOL) 500 MG tablet Take 1,000 mg by mouth every 6 (six) hours as needed for moderate pain or headache.    . baclofen (LIORESAL) 10 MG tablet Take 1 tablet (10 mg total) by mouth 3 (three) times daily. (Patient not taking: Reported on 03/28/2019) 30 each 0  . benzonatate (TESSALON) 200 MG capsule TAKE 1 CAPSULE BY MOUTH EVERY DAY 3 TIMES A DAY AS NEEDED FOR COUGH 40 capsule 0  . Calcium Carb-Cholecalciferol (CALCIUM 600/VITAMIN D3 PO) Take by mouth.    . Cyanocobalamin (B-12 PO) Take 2 tablets by mouth daily.    Marland Kitchen dexamethasone (DECADRON) 4 MG tablet Take 88m by mouth twice daily the day before, of, and after chemo 40 tablet 1  . Fish Oil-Cholecalciferol (OMEGA-3 + VITAMIN D3 PO) Take 1 tablet by mouth daily.    . folic acid (FOLVITE) 1 MG tablet Take 1 tablet (1 mg total)  by mouth daily. 90 tablet 0  . lidocaine-prilocaine (EMLA) cream Apply 1 application topically as needed. Apply 1-2 tsp over port site 1-2 hours prior to chemo. 30 g 0  . meloxicam (MOBIC) 15 MG tablet TAKE 1 TABLET BY MOUTH EVERY DAY AS NEEDED FOR PAIN (Patient not taking: Reported on 05/31/2019) 30 tablet 0  . ondansetron (ZOFRAN) 8 MG tablet Take 8 mg  by mouth every 8 (eight) hours as needed.    Marland Kitchen OVER THE COUNTER MEDICATION Apply 1 drop topically daily as needed. 1 drop CBD oil in vicks vapor rub . Applied to right thigh for pain relief.    . pregabalin (LYRICA) 75 MG capsule 1 tablet daily for 10 days, then 1 tablet twice a day 60 capsule 3  . PRESCRIPTION MEDICATION Inject 1 Dose as directed every 21 ( twenty-one) days. Chemo    . prochlorperazine (COMPAZINE) 10 MG tablet Take 1 tablet (10 mg total) by mouth every 6 (six) hours as needed for nausea or vomiting. (Patient not taking: Reported on 05/31/2019) 30 tablet 0   No current facility-administered medications for this visit.   Facility-Administered Medications Ordered in Other Visits  Medication Dose Route Frequency Provider Last Rate Last Admin  . heparin lock flush 100 unit/mL  500 Units Intracatheter Once PRN Curt Bears, MD      . PEMEtrexed (ALIMTA) 900 mg in sodium chloride 0.9 % 100 mL chemo infusion  500 mg/m2 (Treatment Plan Recorded) Intravenous Once Curt Bears, MD      . sodium chloride flush (NS) 0.9 % injection 10 mL  10 mL Intracatheter PRN Curt Bears, MD        SURGICAL HISTORY:  Past Surgical History:  Procedure Laterality Date  . CESAREAN SECTION     myomectomy  . COLONOSCOPY W/ POLYPECTOMY    . IR GENERIC HISTORICAL  08/17/2016   IR US GUIDE VASC ACCESS RIGHT 08/17/2016 [name], MD [facility]  . IR GENERIC HISTORICAL  08/17/2016   IR FLUORO GUIDE PORT INSERTION RIGHT 08/17/2016 [name], MD [facility]  . MEDIASTINOSCOPY N/A 09/25/2015   Procedure: MEDIASTINOSCOPY;  Surgeon: Melrose Nakayama, MD;  Location: [facility];  Service: Thoracic;  Laterality: N/A;  . VIDEO BRONCHOSCOPY Bilateral 08/19/2015   Procedure: VIDEO BRONCHOSCOPY WITH FLUORO;  Surgeon: Rigoberto Noel, MD;  Location: [facility];  Service: Cardiopulmonary;  Laterality: Bilateral;  . VIDEO BRONCHOSCOPY N/A 09/08/2015   Procedure: VIDEO BRONCHOSCOPY WITH FLUORO;   Surgeon: Rigoberto Noel, MD;  Location: [facility];  Service: Thoracic;  Laterality: N/A;  . VIDEO BRONCHOSCOPY WITH ENDOBRONCHIAL ULTRASOUND Right 09/08/2015   Procedure: ATTEMPTED VIDEO BRONCHOSCOPY ENDOBRONCHIAL ULTRASOUND  ;  Surgeon: Rigoberto Noel, MD;  Location: [facility];  Service: Thoracic;  Laterality: Right;    REVIEW OF SYSTEMS:   Review of Systems  Constitutional: Positive for fatigue following her last cycle of treatment. Negative for appetite change, chills, fever and unexpected weight change.  HENT: Negative for mouth sores, nosebleeds, sore throat and trouble swallowing.   Eyes: Negative for eye problems and icterus.  Respiratory: Positive for productive cough. Negative for hemoptysis, shortness of breath and wheezing.   Cardiovascular: Positive for stable to slightly improved bilateral lower extremtiy swelling. Negative for chest pain. Gastrointestinal: Positive for wave-like abdominal discomfort across RUQ and LUQ. Negative for constipation, diarrhea, nausea and vomiting.  Genitourinary: Negative for bladder incontinence, difficulty urinating, dysuria, frequency and hematuria.   Musculoskeletal: Positive for chronic low back pain. Negative for gait problem, neck  pain and neck stiffness.  Skin: Negative for itching and rash.  Neurological: Negative for dizziness, extremity weakness, gait problem, headaches, light-headedness and seizures. Ambulates with a cane.  Hematological: Negative for adenopathy. Does not bruise/bleed easily.  Psychiatric/Behavioral: Negative for confusion, depression and sleep disturbance. The patient is not nervous/anxious.     PHYSICAL EXAMINATION:  Blood pressure 115/81, pulse 89, temperature 98 F (36.7 C), temperature source Temporal, resp. rate 18, height '5\' 3"'  (1.6 m), last menstrual period 06/09/1998, SpO2 100 %.  ECOG PERFORMANCE STATUS: 1 - Symptomatic but completely ambulatory  Physical Exam  Constitutional: Oriented to person, place, and time and  well-developed, well-nourished, and in no distress.   HENT:  Head: Normocephalic and atraumatic.  Mouth/Throat: Oropharynx is clear and moist. No oropharyngeal exudate.  Eyes: Conjunctivae are normal. Right eye exhibits no discharge. Left eye exhibits no discharge. No scleral icterus.  Neck: Normal range of motion. Neck supple.  Cardiovascular: Normal rate, regular rhythm, normal heart sounds and intact distal pulses.   Pulmonary/Chest: Effort normal and breath sounds normal. No respiratory distress. No wheezes. No rales.  Abdominal: Soft. Bowel sounds are normal. Exhibits no distension and no mass. There is no tenderness.  Musculoskeletal: Normal range of motion. Exhibits no edema.  Lymphadenopathy:    No cervical adenopathy.  Neurological: Alert and oriented to person, place, and time. Exhibits normal muscle tone. Gait normal. Coordination normal.  Skin: Skin is warm and dry. No rash noted. Not diaphoretic. No erythema. No pallor.  Psychiatric: Mood, memory and judgment normal.  Vitals reviewed.  LABORATORY DATA: Lab Results  Component Value Date   WBC 11.3 (H) 08/23/2019   HGB 10.8 (L) 08/23/2019   HCT 32.7 (L) 08/23/2019   MCV 103.8 (H) 08/23/2019   PLT 307 08/23/2019      Chemistry      Component Value Date/Time   NA 138 08/23/2019 1108   NA 137 06/09/2017 0901   K 4.3 08/23/2019 1108   K 3.9 06/09/2017 0901   CL 105 08/23/2019 1108   CO2 23 08/23/2019 1108   CO2 25 06/09/2017 0901   BUN 24 (H) 08/23/2019 1108   BUN 19.0 06/09/2017 0901   CREATININE 1.04 (H) 08/23/2019 1108   CREATININE 1.2 (H) 06/09/2017 0901   GLU 170 01/23/2016 0000      Component Value Date/Time   CALCIUM 9.0 08/23/2019 1108   CALCIUM 9.9 06/09/2017 0901   ALKPHOS 77 08/23/2019 1108   ALKPHOS 63 06/09/2017 0901   AST 25 08/23/2019 1108   AST 35 (H) 06/09/2017 0901   ALT 14 08/23/2019 1108   ALT 23 06/09/2017 0901   BILITOT 0.4 08/23/2019 1108   BILITOT 0.65 06/09/2017 0901        RADIOGRAPHIC STUDIES:  No results found.   ASSESSMENT/PLAN:  This is a very pleasant 74 year old Hispanic female diagnosed with metastatic non-small cell lung cancer, adenocarcinoma. She presented with a right lower lobe lung nodule in addition to mediastinal lymphadenopathy and metastatic bone lesions. She was diagnosed in March 2017. Her PDL 1 expression is <1% and she has no actionable mutations.  She previously underwent inductiontreatmentwith systemic chemotherapy withCarboplatin and Alimta. She is currently undergoing maintenance therapy withAlimta. She is status post59cycles. The patient has been tolerating this treatment fairly well without any concerning adversesideeffects.  The patient was seen with Dr. Julien Nordmann today. Labs were reviewed. We recommend that she proceed with cycle #60 today as scheduled.   I will arrange for a restaging CT scan of the  chest, abdomen, and pelvis prior to her next visit. She was advised that if her coughing worsens or if she develops new or worsening symptoms such as fever, chills, sore throat, shortness of breath, we can evaluate her sooner.   We will see her back for a follow up visit in 3 weeks for evaluation before starting cycle #61.   She will continue to follow with Dr. Posey Pronto, her neurologist for her back pain.  Discussed strategies to improve her leg swelling and techniques to help get her compression stockings on easier. She was instructed to bring in her compression stockings next time so we can demonstrate the best way to get them on. She will continue to elevate her legs when able.   Discussed that we encourage her to receive her COVID-19 vaccine.   The patient was advised to call immediately if she has any concerning symptoms in the interval. The patient voices understanding of current disease status and treatment options and is in agreement with the current care plan. All questions were answered. The patient knows  to call the clinic with any problems, questions or concerns. We can certainly see the patient much sooner if necessary     Orders Placed This Encounter  Procedures  . CT Chest W Contrast    Standing Status:   Future    Standing Expiration Date:   08/22/2020    Order Specific Question:   ** REASON FOR EXAM (FREE TEXT)    Answer:   Restaging Lung Cancer    Order Specific Question:   If indicated for the ordered procedure, I authorize the administration of contrast media per Radiology protocol    Answer:   Yes    Order Specific Question:   Preferred imaging location?    Answer:   [facility]    Order Specific Question:   Radiology Contrast Protocol - do NOT remove file path    Answer:   \\[email or web address]  . CT Abdomen Pelvis W Contrast    Standing Status:   Future    Standing Expiration Date:   08/22/2020    Order Specific Question:   ** REASON FOR EXAM (FREE TEXT)    Answer:   Restaging Lung Cancer    Order Specific Question:   If indicated for the ordered procedure, I authorize the administration of contrast media per Radiology protocol    Answer:   Yes    Order Specific Question:   Preferred imaging location?    Answer:   [facility]    Order Specific Question:   Is Oral Contrast requested for this exam?    Answer:   Yes, Per Radiology protocol    Order Specific Question:   Radiology Contrast Protocol - do NOT remove file path    Answer:   \\[email or web address]     Shivaay Stormont L Darrelle Wiberg, PA-C 08/23/19  ADDENDUM: Hematology/Oncology Attending: I had a face-to-face encounter with the patient today.  I recommended her care plan.  This is a very pleasant 74 years old Hispanic female with stage IV non-small cell lung cancer status post induction systemic chemotherapy with carboplatin and Alimta and she is currently on maintenance treatment with Alimta status post 59 cycles.  The patient has been tolerating her  treatment well with no concerning adverse effects.  She continues to have the low back pain. I recommended for her to proceed with cycle #60 today as planned. We will arrange for the patient to have repeat CT scan of the chest, abdomen  pelvis in 3 weeks for evaluation and restaging of her disease. She was advised to call immediately if she has any concerning symptoms in the interval.  Disclaimer: This note was dictated with voice recognition software. Similar sounding words can inadvertently be transcribed and may be missed upon review. Eilleen Kempf, MD 08/23/19

## 2019-08-23 ENCOUNTER — Encounter: Payer: Self-pay | Admitting: Physician Assistant

## 2019-08-23 ENCOUNTER — Inpatient Hospital Stay: Payer: Medicare Other | Attending: Internal Medicine

## 2019-08-23 ENCOUNTER — Inpatient Hospital Stay: Payer: Medicare Other

## 2019-08-23 ENCOUNTER — Inpatient Hospital Stay (HOSPITAL_BASED_OUTPATIENT_CLINIC_OR_DEPARTMENT_OTHER): Payer: Medicare Other | Admitting: Physician Assistant

## 2019-08-23 ENCOUNTER — Other Ambulatory Visit: Payer: Self-pay

## 2019-08-23 VITALS — BP 115/81 | HR 89 | Temp 98.0°F | Resp 18 | Ht 63.0 in

## 2019-08-23 DIAGNOSIS — C3491 Malignant neoplasm of unspecified part of right bronchus or lung: Secondary | ICD-10-CM | POA: Diagnosis not present

## 2019-08-23 DIAGNOSIS — Z95828 Presence of other vascular implants and grafts: Secondary | ICD-10-CM

## 2019-08-23 DIAGNOSIS — Z5111 Encounter for antineoplastic chemotherapy: Secondary | ICD-10-CM | POA: Diagnosis not present

## 2019-08-23 DIAGNOSIS — Z79899 Other long term (current) drug therapy: Secondary | ICD-10-CM | POA: Diagnosis not present

## 2019-08-23 DIAGNOSIS — Z791 Long term (current) use of non-steroidal anti-inflammatories (NSAID): Secondary | ICD-10-CM | POA: Diagnosis not present

## 2019-08-23 DIAGNOSIS — C7951 Secondary malignant neoplasm of bone: Secondary | ICD-10-CM | POA: Diagnosis not present

## 2019-08-23 DIAGNOSIS — Z7952 Long term (current) use of systemic steroids: Secondary | ICD-10-CM | POA: Insufficient documentation

## 2019-08-23 DIAGNOSIS — C3431 Malignant neoplasm of lower lobe, right bronchus or lung: Secondary | ICD-10-CM | POA: Insufficient documentation

## 2019-08-23 DIAGNOSIS — E7800 Pure hypercholesterolemia, unspecified: Secondary | ICD-10-CM | POA: Insufficient documentation

## 2019-08-23 DIAGNOSIS — M545 Low back pain: Secondary | ICD-10-CM | POA: Insufficient documentation

## 2019-08-23 DIAGNOSIS — I10 Essential (primary) hypertension: Secondary | ICD-10-CM | POA: Diagnosis not present

## 2019-08-23 DIAGNOSIS — M8580 Other specified disorders of bone density and structure, unspecified site: Secondary | ICD-10-CM | POA: Diagnosis not present

## 2019-08-23 LAB — CBC WITH DIFFERENTIAL (CANCER CENTER ONLY)
Abs Immature Granulocytes: 0.1 10*3/uL — ABNORMAL HIGH (ref 0.00–0.07)
Basophils Absolute: 0 10*3/uL (ref 0.0–0.1)
Basophils Relative: 0 %
Eosinophils Absolute: 0 10*3/uL (ref 0.0–0.5)
Eosinophils Relative: 0 %
HCT: 32.7 % — ABNORMAL LOW (ref 36.0–46.0)
Hemoglobin: 10.8 g/dL — ABNORMAL LOW (ref 12.0–15.0)
Immature Granulocytes: 1 %
Lymphocytes Relative: 6 %
Lymphs Abs: 0.7 10*3/uL (ref 0.7–4.0)
MCH: 34.3 pg — ABNORMAL HIGH (ref 26.0–34.0)
MCHC: 33 g/dL (ref 30.0–36.0)
MCV: 103.8 fL — ABNORMAL HIGH (ref 80.0–100.0)
Monocytes Absolute: 1 10*3/uL (ref 0.1–1.0)
Monocytes Relative: 8 %
Neutro Abs: 9.5 10*3/uL — ABNORMAL HIGH (ref 1.7–7.7)
Neutrophils Relative %: 85 %
Platelet Count: 307 10*3/uL (ref 150–400)
RBC: 3.15 MIL/uL — ABNORMAL LOW (ref 3.87–5.11)
RDW: 14.8 % (ref 11.5–15.5)
WBC Count: 11.3 10*3/uL — ABNORMAL HIGH (ref 4.0–10.5)
nRBC: 0 % (ref 0.0–0.2)

## 2019-08-23 LAB — CMP (CANCER CENTER ONLY)
ALT: 14 U/L (ref 0–44)
AST: 25 U/L (ref 15–41)
Albumin: 3.1 g/dL — ABNORMAL LOW (ref 3.5–5.0)
Alkaline Phosphatase: 77 U/L (ref 38–126)
Anion gap: 10 (ref 5–15)
BUN: 24 mg/dL — ABNORMAL HIGH (ref 8–23)
CO2: 23 mmol/L (ref 22–32)
Calcium: 9 mg/dL (ref 8.9–10.3)
Chloride: 105 mmol/L (ref 98–111)
Creatinine: 1.04 mg/dL — ABNORMAL HIGH (ref 0.44–1.00)
GFR, Est AFR Am: >60 mL/min (ref >60)
GFR, Estimated: 54 mL/min — ABNORMAL LOW (ref >60)
Glucose, Bld: 99 mg/dL (ref 70–99)
Potassium: 4.3 mmol/L (ref 3.5–5.1)
Sodium: 138 mmol/L (ref 135–145)
Total Bilirubin: 0.4 mg/dL (ref 0.3–1.2)
Total Protein: 6.8 g/dL (ref 6.5–8.1)

## 2019-08-23 MED ORDER — HEPARIN SOD (PORK) LOCK FLUSH 100 UNIT/ML IV SOLN
500.0000 [IU] | Freq: Once | INTRAVENOUS | Status: AC | PRN
  Administered 2019-08-23: 500 [IU]
  Filled 2019-08-23: qty 5

## 2019-08-23 MED ORDER — SODIUM CHLORIDE 0.9% FLUSH
10.0000 mL | INTRAVENOUS | Status: DC | PRN
  Administered 2019-08-23: 10 mL
  Filled 2019-08-23: qty 10

## 2019-08-23 MED ORDER — PROCHLORPERAZINE MALEATE 10 MG PO TABS
10.0000 mg | ORAL_TABLET | Freq: Once | ORAL | Status: AC
  Administered 2019-08-23: 10 mg via ORAL

## 2019-08-23 MED ORDER — HEPARIN SOD (PORK) LOCK FLUSH 100 UNIT/ML IV SOLN
500.0000 [IU] | Freq: Once | INTRAVENOUS | Status: DC
  Filled 2019-08-23: qty 5

## 2019-08-23 MED ORDER — SODIUM CHLORIDE 0.9 % IV SOLN
500.0000 mg/m2 | Freq: Once | INTRAVENOUS | Status: AC
  Administered 2019-08-23: 900 mg via INTRAVENOUS
  Filled 2019-08-23: qty 20

## 2019-08-23 MED ORDER — PROCHLORPERAZINE MALEATE 10 MG PO TABS
ORAL_TABLET | ORAL | Status: AC
  Filled 2019-08-23: qty 1

## 2019-08-23 MED ORDER — SODIUM CHLORIDE 0.9% FLUSH
10.0000 mL | INTRAVENOUS | Status: DC | PRN
  Administered 2019-08-23: 11:00:00 10 mL via INTRAVENOUS
  Filled 2019-08-23: qty 10

## 2019-08-23 MED ORDER — SODIUM CHLORIDE 0.9 % IV SOLN
Freq: Once | INTRAVENOUS | Status: AC
  Filled 2019-08-23: qty 250

## 2019-08-23 NOTE — Patient Instructions (Signed)
[facility] Discharge Instructions for Patients Receiving Chemotherapy  Today you received the following chemotherapy agents: pemetrexed.  To help prevent nausea and vomiting after your treatment, we encourage you to take your nausea medication as directed.   If you develop nausea and vomiting that is not controlled by your nausea medication, call the clinic.   BELOW ARE SYMPTOMS THAT SHOULD BE REPORTED IMMEDIATELY:  *FEVER GREATER THAN 100.5 F  *CHILLS WITH OR WITHOUT FEVER  NAUSEA AND VOMITING THAT IS NOT CONTROLLED WITH YOUR NAUSEA MEDICATION  *UNUSUAL SHORTNESS OF BREATH  *UNUSUAL BRUISING OR BLEEDING  TENDERNESS IN MOUTH AND THROAT WITH OR WITHOUT PRESENCE OF ULCERS  *URINARY PROBLEMS  *BOWEL PROBLEMS  UNUSUAL RASH Items with * indicate a potential emergency and should be followed up as soon as possible.  Feel free to call the clinic should you have any questions or concerns. The clinic phone number is [phone].  Please show the [facility] at check-in to the Emergency Department and triage nurse.

## 2019-09-07 NOTE — Progress Notes (Signed)
Pharmacist Chemotherapy Monitoring - Follow Up Assessment    I verify that I have reviewed each item in the below checklist:  . Regimen for the patient is scheduled for the appropriate day and plan matches scheduled date. Marland Kitchen Appropriate non-routine labs are ordered dependent on drug ordered. . If applicable, additional medications reviewed and ordered per protocol based on lifetime cumulative doses and/or treatment regimen.   Plan for follow-up and/or issues identified: No . I-vent associated with next due treatment: No . MD and/or nursing notified: No  [place] Oborn K 09/07/2019 8:51 AM

## 2019-09-10 ENCOUNTER — Other Ambulatory Visit: Payer: Self-pay

## 2019-09-10 ENCOUNTER — Ambulatory Visit (HOSPITAL_COMMUNITY)
Admission: RE | Admit: 2019-09-10 | Discharge: 2019-09-10 | Disposition: A | Discharge status: Home / Self Care | Payer: Medicare Other | Source: Ambulatory Visit | Attending: Physician Assistant | Admitting: Physician Assistant

## 2019-09-10 DIAGNOSIS — C3491 Malignant neoplasm of unspecified part of right bronchus or lung: Secondary | ICD-10-CM | POA: Diagnosis not present

## 2019-09-10 MED ORDER — IOHEXOL 300 MG/ML  SOLN
100.0000 mL | Freq: Once | INTRAMUSCULAR | Status: AC | PRN
  Administered 2019-09-10: 100 mL via INTRAVENOUS

## 2019-09-10 MED ORDER — SODIUM CHLORIDE (PF) 0.9 % IJ SOLN
INTRAMUSCULAR | Status: AC
  Filled 2019-09-10: qty 50

## 2019-09-12 NOTE — Progress Notes (Signed)
[place], MD [address] [place] [address]  DIAGNOSIS: Stage IV (T1b, N2, M1b) non-small cell lung cancer, adenocarcinoma diagnosed in March 2017 and presented with right lower lobe lung nodule in addition to mediastinal lymphadenopathy and metastatic bone lesions.  Molecular studies:PDL 1 TPS <1%.   [facility] Studies: Positive for ERBB2 [id number]. Negative for EGFR, KRAS, ALK, BRAF, MET, RET and ROS1.  PRIOR THERAPY:  1) Induction systemic chemotherapy with carboplatin for AUC of 5 and Alimta 500 MG/M2 every 3 weeks is status post 6 cycles at the [facility], last dose was given 03/05/2016 with stable disease. 2) palliative radiation to the metastatic bone disease in the lower back and pelvic area.  CURRENT THERAPY: 1) Maintenance systemic chemotherapy with Alimta 500 MG/M2 every 3 weeks status post60cycles. First dose was given 03/25/2016.  2) Zometa 4 mg IV every 12 week for metastatic bone disease.  INTERVAL HISTORY: Valerie Long 74 y.o. female returns to the clinic for a follow up visit. The patient is feeling well today without any concerning complaints except for worsening cough at night. She coughs up white phlegm. She needs a refill of tessalon. She notes post nasal drainage. The patient continues to tolerate treatment withsystemic chemotherapy with Alimtawell without any adverse sideeffectsexcept facial flushing from her steroid pre-medication. Denies any fevers, chills,night sweats orweight loss. Denies any hemoptysis or chest pain. Denies any vomiting, diarrhea, or constipation but reports very mild nausea. Denies any headache or visual changes.She recently had a restaging CT scan performed. She is here today for evaluation and to review her scan results before starting cycle #61  MEDICAL HISTORY: Past Medical History:  Diagnosis Date  . Adenocarcinoma of right lung,  stage 4 ([place]) 2017  . Anemia   . Arthritis   . Bone metastases ([place]) 02/24/2017  . Encounter for antineoplastic chemotherapy 03/17/2016  . Hypercholesterolemia   . Hypertension 06/18/2016  . Osteopenia   . 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 Pneumonia   . Shortness of breath dyspnea     ALLERGIES:  has No Known Allergies.  MEDICATIONS:  Current Outpatient Medications  Medication Sig Dispense Refill  . acetaminophen (TYLENOL) 500 MG tablet Take 1,000 mg by mouth every 6 (six) hours as needed for moderate pain or headache.    . benzonatate (TESSALON) 200 MG capsule TAKE 1 CAPSULE BY MOUTH EVERY DAY 3 TIMES A DAY AS NEEDED FOR COUGH 40 capsule 0  . Calcium Carb-Cholecalciferol (CALCIUM 600/VITAMIN D3 PO) Take by mouth.    . Cyanocobalamin (B-12 PO) Take 2 tablets by mouth daily.    Marland Kitchen dexamethasone (DECADRON) 4 MG tablet Take 55m by mouth twice daily the day before, of, and after chemo 40 tablet 1  . Fish Oil-Cholecalciferol (OMEGA-3 + VITAMIN D3 PO) Take 1 tablet by mouth daily.    . folic acid (FOLVITE) 1 MG tablet Take 1 tablet (1 mg total) by mouth daily. 90 tablet 0  . lidocaine-prilocaine (EMLA) cream Apply 1 application topically as needed. Apply 1-2 tsp over port site 1-2 hours prior to chemo. 30 g 0  . ondansetron (ZOFRAN) 8 MG tablet Take 8 mg by mouth every 8 (eight) hours as needed.    .Marland KitchenOVER THE COUNTER MEDICATION Apply 1 drop topically daily as needed. 1 drop CBD oil in vicks vapor rub . Applied to right thigh for pain relief.    .Marland Kitchen  pregabalin (LYRICA) 75 MG capsule 1 tablet daily for 10 days, then 1 tablet twice a day 60 capsule 3  . PRESCRIPTION MEDICATION Inject 1 Dose as directed every 21 ( twenty-one) days. Chemo    . baclofen (LIORESAL) 10 MG tablet Take 1 tablet (10 mg total) by mouth 3 (three) times daily. (Patient not taking: Reported on 03/28/2019) 30 each 0  . meloxicam (MOBIC) 15 MG tablet TAKE 1 TABLET BY MOUTH EVERY DAY AS NEEDED FOR PAIN  (Patient not taking: Reported on 05/31/2019) 30 tablet 0  . prochlorperazine (COMPAZINE) 10 MG tablet Take 1 tablet (10 mg total) by mouth every 6 (six) hours as needed for nausea or vomiting. (Patient not taking: Reported on 05/31/2019) 30 tablet 0   No current facility-administered medications for this visit.   Facility-Administered Medications Ordered in Other Visits  Medication Dose Route Frequency Provider Last Rate Last Admin  . heparin lock flush 100 unit/mL  500 Units Intracatheter Once PRN Curt Bears, MD      . PEMEtrexed (ALIMTA) 900 mg in sodium chloride 0.9 % 100 mL chemo infusion  500 mg/m2 (Treatment Plan Recorded) Intravenous Once Curt Bears, MD      . sodium chloride flush (NS) 0.9 % injection 10 mL  10 mL Intracatheter PRN Curt Bears, MD        SURGICAL HISTORY:  Past Surgical History:  Procedure Laterality Date  . CESAREAN SECTION     myomectomy  . COLONOSCOPY W/ POLYPECTOMY    . IR GENERIC HISTORICAL  08/17/2016   IR US GUIDE VASC ACCESS RIGHT 08/17/2016 [name], MD [facility]  . IR GENERIC HISTORICAL  08/17/2016   IR FLUORO GUIDE PORT INSERTION RIGHT 08/17/2016 [name], MD [facility]  . MEDIASTINOSCOPY N/A 09/25/2015   Procedure: MEDIASTINOSCOPY;  Surgeon: Melrose Nakayama, MD;  Location: [facility];  Service: Thoracic;  Laterality: N/A;  . VIDEO BRONCHOSCOPY Bilateral 08/19/2015   Procedure: VIDEO BRONCHOSCOPY WITH FLUORO;  Surgeon: Rigoberto Noel, MD;  Location: [facility];  Service: Cardiopulmonary;  Laterality: Bilateral;  . VIDEO BRONCHOSCOPY N/A 09/08/2015   Procedure: VIDEO BRONCHOSCOPY WITH FLUORO;  Surgeon: Rigoberto Noel, MD;  Location: [facility];  Service: Thoracic;  Laterality: N/A;  . VIDEO BRONCHOSCOPY WITH ENDOBRONCHIAL ULTRASOUND Right 09/08/2015   Procedure: ATTEMPTED VIDEO BRONCHOSCOPY ENDOBRONCHIAL ULTRASOUND  ;  Surgeon: Rigoberto Noel, MD;  Location: [facility];  Service: Thoracic;  Laterality: Right;    REVIEW OF SYSTEMS:    Review of Systems  Constitutional: Negative for appetite change, fatigue, chills, fever and unexpected weight change.  HENT: Negative for mouth sores, nosebleeds, sore throat and trouble swallowing.   Eyes: Negative for eye problems and icterus.  Respiratory: Positive for productive cough. Negative for hemoptysis, shortness of breath and wheezing.   Cardiovascular: Positive for stable bilateral lower extremity swelling. Negative for chest pain. Gastrointestinal: Positive for mild nausea. Negative for constipation, diarrhea, and vomiting.  Genitourinary: Negative for bladder incontinence, difficulty urinating, dysuria, frequency and hematuria.   Musculoskeletal: Positive for chronic low back pain. Negative for gait problem, neck pain and neck stiffness.  Skin: Negative for itching and rash.  Neurological: Negative for dizziness, extremity weakness, gait problem, headaches, light-headedness and seizures. Ambulates with a cane.  Hematological: Negative for adenopathy. Does not bruise/bleed easily.  Psychiatric/Behavioral: Negative for confusion, depression and sleep disturbance. The patient is not nervous/anxious.      PHYSICAL EXAMINATION:  Blood pressure (!) 146/87, pulse 67, temperature 98.9 F (37.2 C), temperature source Temporal,  resp. rate 17, height _0  (1.6 m), weight 152 lb 14.4 oz (69.4 kg), last menstrual period 06/09/1998, SpO2 100 %.  ECOG PERFORMANCE STATUS: 1 - Symptomatic but completely ambulatory  Physical Exam  Constitutional: Oriented to person, place, and time and well-developed, well-nourished, and in no distress.   HENT:  Head: Normocephalic and atraumatic.  Mouth/Throat: Oropharynx is clear and moist. No oropharyngeal exudate.  Eyes: Conjunctivae are normal. Right eye exhibits no discharge. Left eye exhibits no discharge. No scleral icterus.  Neck: Normal range of motion. Neck supple.  Cardiovascular: Normal rate, regular rhythm, normal heart sounds and intact  distal pulses.   Pulmonary/Chest: Effort normal and breath sounds normal. No respiratory distress. No wheezes. No rales.  Abdominal: Soft. Bowel sounds are normal. Exhibits no distension and no mass. There is no tenderness.  Musculoskeletal: Positive for bilateral lower extremity edema (stable). Normal range of motion.  Lymphadenopathy:    No cervical adenopathy.  Neurological: Alert and oriented to person, place, and time. Exhibits normal muscle tone. Gait normal. Coordination normal.  Skin: Skin is warm and dry. No rash noted. Not diaphoretic. No erythema. No pallor.  Psychiatric: Mood, memory and judgment normal.  Vitals reviewed.  LABORATORY DATA: Lab Results  Component Value Date   WBC 7.6 09/13/2019   HGB 10.0 (L) 09/13/2019   HCT 30.6 (L) 09/13/2019   MCV 104.4 (H) 09/13/2019   PLT 351 09/13/2019      Chemistry      Component Value Date/Time   NA 135 09/13/2019 1417   NA 137 06/09/2017 0901   K 4.5 09/13/2019 1417   K 3.9 06/09/2017 0901   CL 102 09/13/2019 1417   CO2 25 09/13/2019 1417   CO2 25 06/09/2017 0901   BUN 15 09/13/2019 1417   BUN 19.0 06/09/2017 0901   CREATININE 1.05 (H) 09/13/2019 1417   CREATININE 1.2 (H) 06/09/2017 0901   GLU 170 01/23/2016 0000      Component Value Date/Time   CALCIUM 9.4 09/13/2019 1417   CALCIUM 9.9 06/09/2017 0901   ALKPHOS 75 09/13/2019 1417   ALKPHOS 63 06/09/2017 0901   AST 31 09/13/2019 1417   AST 35 (H) 06/09/2017 0901   ALT 15 09/13/2019 1417   ALT 23 06/09/2017 0901   BILITOT 0.3 09/13/2019 1417   BILITOT 0.65 06/09/2017 0901       RADIOGRAPHIC STUDIES:  CT Chest W Contrast  Result Date: 09/10/2019 CLINICAL DATA:  Stage IV right lung cancer, restaging EXAM: CT CHEST, ABDOMEN, AND PELVIS WITH CONTRAST TECHNIQUE: Multidetector CT imaging of the chest, abdomen and pelvis was performed following the standard protocol during bolus administration of intravenous contrast. CONTRAST:  129m OMNIPAQUE IOHEXOL 300 MG/ML   SOLN COMPARISON:  06/19/2019 FINDINGS: CT CHEST FINDINGS Cardiovascular: Heart is normal in size.  No pericardial effusion. No evidence of thoracic aortic aneurysm. Mild atherosclerotic calcifications of the aortic arch. Right chest port terminates in the lower SVC. Mediastinum/Nodes: No suspicious mediastinal, hilar, or axillary lymphadenopathy. Visualized thyroid is unremarkable. Lungs/Pleura: Multiple bilateral pulmonary nodules/masses in this patient with known multifocal adenocarcinoma. Index lesions include: --14 mm spiculated right upper lobe nodule (image 43), unchanged --10 mm irregular superior segment right lower lobe nodule (image 49), unchanged --12 mm lingular nodule (image 74), unchanged --3.3 x 3.7 cm anterior right lower lobe mass (image 81), previously 3.4 x 3.4 cm when measured in a similar fashion No focal consolidation. No pleural effusion or pneumothorax. Musculoskeletal: Multifocal sclerotic osseous metastases in the visualized axial and  appendicular skeleton, grossly unchanged. CT ABDOMEN PELVIS FINDINGS Hepatobiliary: Liver is notable for two tiny 5 mm cysts. Otherwise within normal limits. Layering small gallstones (axial image 56). No associated inflammatory changes. No intrahepatic or extrahepatic ductal dilatation. Pancreas: Within normal limits. Spleen: Within normal limits. Adrenals/Urinary Tract: Adrenal glands are within normal limits. Left pelvic kidney. Right kidney is within normal limits. No hydronephrosis. Bladder is mildly thick-walled although underdistended. Stomach/Bowel: Mild wall thickening involving the distal gastric body (axial image 60), nonspecific. Intestinal non rotation.  No evidence of bowel obstruction. Normal appendix (axial image 88). No colonic wall thickening or inflammatory changes. Vascular/Lymphatic: No evidence of abdominal aortic aneurysm. Atherosclerotic calcifications of the abdominal aorta and branch vessels. No suspicious abdominopelvic  lymphadenopathy. Reproductive: Uterus is unremarkable. No adnexal masses. Other: No abdominopelvic ascites. Musculoskeletal: Multifocal osseous metastases in the visualized axial and appendicular skeleton, unchanged. IMPRESSION: Multifocal pulmonary nodules/masses in this patient with known multifocal adenocarcinoma, unchanged. Multifocal osseous metastases in the visualized axial and appendicular skeleton, unchanged. No evidence of new/progressive metastatic disease. Electronically Signed   By: Julian Hy M.D.   On: 09/10/2019 16:05   CT Abdomen Pelvis W Contrast  Result Date: 09/10/2019 CLINICAL DATA:  Stage IV right lung cancer, restaging EXAM: CT CHEST, ABDOMEN, AND PELVIS WITH CONTRAST TECHNIQUE: Multidetector CT imaging of the chest, abdomen and pelvis was performed following the standard protocol during bolus administration of intravenous contrast. CONTRAST:  1103m OMNIPAQUE IOHEXOL 300 MG/ML  SOLN COMPARISON:  06/19/2019 FINDINGS: CT CHEST FINDINGS Cardiovascular: Heart is normal in size.  No pericardial effusion. No evidence of thoracic aortic aneurysm. Mild atherosclerotic calcifications of the aortic arch. Right chest port terminates in the lower SVC. Mediastinum/Nodes: No suspicious mediastinal, hilar, or axillary lymphadenopathy. Visualized thyroid is unremarkable. Lungs/Pleura: Multiple bilateral pulmonary nodules/masses in this patient with known multifocal adenocarcinoma. Index lesions include: --14 mm spiculated right upper lobe nodule (image 43), unchanged --10 mm irregular superior segment right lower lobe nodule (image 49), unchanged --12 mm lingular nodule (image 74), unchanged --3.3 x 3.7 cm anterior right lower lobe mass (image 81), previously 3.4 x 3.4 cm when measured in a similar fashion No focal consolidation. No pleural effusion or pneumothorax. Musculoskeletal: Multifocal sclerotic osseous metastases in the visualized axial and appendicular skeleton, grossly unchanged. CT  ABDOMEN PELVIS FINDINGS Hepatobiliary: Liver is notable for two tiny 5 mm cysts. Otherwise within normal limits. Layering small gallstones (axial image 56). No associated inflammatory changes. No intrahepatic or extrahepatic ductal dilatation. Pancreas: Within normal limits. Spleen: Within normal limits. Adrenals/Urinary Tract: Adrenal glands are within normal limits. Left pelvic kidney. Right kidney is within normal limits. No hydronephrosis. Bladder is mildly thick-walled although underdistended. Stomach/Bowel: Mild wall thickening involving the distal gastric body (axial image 60), nonspecific. Intestinal non rotation.  No evidence of bowel obstruction. Normal appendix (axial image 88). No colonic wall thickening or inflammatory changes. Vascular/Lymphatic: No evidence of abdominal aortic aneurysm. Atherosclerotic calcifications of the abdominal aorta and branch vessels. No suspicious abdominopelvic lymphadenopathy. Reproductive: Uterus is unremarkable. No adnexal masses. Other: No abdominopelvic ascites. Musculoskeletal: Multifocal osseous metastases in the visualized axial and appendicular skeleton, unchanged. IMPRESSION: Multifocal pulmonary nodules/masses in this patient with known multifocal adenocarcinoma, unchanged. Multifocal osseous metastases in the visualized axial and appendicular skeleton, unchanged. No evidence of new/progressive metastatic disease. Electronically Signed   By: SJulian HyM.D.   On: 09/10/2019 16:05     ASSESSMENT/PLAN:  This is a very pleasant 74year old Hispanic female diagnosed with metastatic non-small cell lung cancer, adenocarcinoma. She  presented with a right lower lobe lung nodule in addition to mediastinal lymphadenopathy and metastatic bone lesions. She was diagnosed in March 2017. Her PDL 1 expression is <1% and she has no actionable mutations.  She previously underwent inductiontreatmentwith systemic chemotherapy withCarboplatin and Alimta. She is  currently undergoing maintenance therapy withAlimta. She is status post60cycles. The patient has been tolerating this treatment fairly well without any concerning adversesideeffects.  The patient recently had a restaging CT scan performed. Dr. Julien Nordmann personally and independently reviewed the scan and discussed the results with the patient. The scan did not show any evidence of disease progression. Dr. Julien Nordmann recommends that she continue on the same treatment at the same dose. She will receive cycle #61 today as scheduled.   We will see her back for a follow up visit in 3 weeks for evaluation before starting cycle #62.   No acute or infectious findings were appreciated on her recent lung CT scan. Advised the patient to try taking zyrtec or Claritin. She was given a refill of her tessalon  The patient was advised to call immediately if she has any concerning symptoms in the interval. The patient voices understanding of current disease status and treatment options and is in agreement with the current care plan. All questions were answered. The patient knows to call the clinic with any problems, questions or concerns. We can certainly see the patient much sooner if necessary   No orders of the defined types were placed in this encounter.    Valerie Long L Jeania Nater, PA-C 09/13/19  ADDENDUM: Hematology/Oncology Attending: I had a face-to-face encounter with the patient today.  I recommended her care plan.  This is a very pleasant 74 years old Hispanic female with stage IV non-small cell lung cancer, adenocarcinoma status post induction systemic chemotherapy with carboplatin and Alimta and she is currently undergoing maintenance treatment with single agent Alimta status post 60 cycles.  The patient is tolerating her treatment well except for the generalized fatigue.  She also continues to have low back pain secondary to degenerative disc disease as well as metastatic disease in that area.   She has increased cough recently but this could be associated with the seasonal allergy.  She had repeat CT scan of the chest, abdomen pelvis performed recently.  I personally and independently reviewed the scans and discussed the results with the patient today. Her scan showed no concerning findings for disease progression and in general showed stable disease. I recommended for the patient to continue her current treatment with maintenance Alimta and she will proceed with cycle #61 today. The patient will come back for follow-up visit in 3 weeks for evaluation before the next cycle of her treatment. For the seasonal allergy she was advised to use Zyrtec or Claritin on as-needed basis. The patient was advised to call immediately if she has any other concerning symptoms in the interval.  Disclaimer: This note was dictated with voice recognition software. Similar sounding words can inadvertently be transcribed and may be missed upon review. Eilleen Kempf, MD 09/13/19

## 2019-09-13 ENCOUNTER — Inpatient Hospital Stay: Payer: Medicare Other

## 2019-09-13 ENCOUNTER — Ambulatory Visit: Payer: Medicare Other

## 2019-09-13 ENCOUNTER — Inpatient Hospital Stay: Payer: Medicare Other | Attending: Internal Medicine | Admitting: Physician Assistant

## 2019-09-13 ENCOUNTER — Encounter: Payer: Self-pay | Admitting: Physician Assistant

## 2019-09-13 ENCOUNTER — Other Ambulatory Visit: Payer: Self-pay

## 2019-09-13 VITALS — BP 146/87 | HR 67 | Temp 98.9°F | Resp 17 | Ht 63.0 in | Wt 152.9 lb

## 2019-09-13 DIAGNOSIS — Z95828 Presence of other vascular implants and grafts: Secondary | ICD-10-CM

## 2019-09-13 DIAGNOSIS — C3491 Malignant neoplasm of unspecified part of right bronchus or lung: Secondary | ICD-10-CM | POA: Diagnosis not present

## 2019-09-13 DIAGNOSIS — I10 Essential (primary) hypertension: Secondary | ICD-10-CM | POA: Diagnosis not present

## 2019-09-13 DIAGNOSIS — Z923 Personal history of irradiation: Secondary | ICD-10-CM | POA: Diagnosis not present

## 2019-09-13 DIAGNOSIS — C7951 Secondary malignant neoplasm of bone: Secondary | ICD-10-CM | POA: Diagnosis not present

## 2019-09-13 DIAGNOSIS — Z79899 Other long term (current) drug therapy: Secondary | ICD-10-CM | POA: Diagnosis not present

## 2019-09-13 DIAGNOSIS — Z5111 Encounter for antineoplastic chemotherapy: Secondary | ICD-10-CM | POA: Diagnosis not present

## 2019-09-13 DIAGNOSIS — M8580 Other specified disorders of bone density and structure, unspecified site: Secondary | ICD-10-CM | POA: Insufficient documentation

## 2019-09-13 DIAGNOSIS — C3431 Malignant neoplasm of lower lobe, right bronchus or lung: Secondary | ICD-10-CM | POA: Insufficient documentation

## 2019-09-13 DIAGNOSIS — Z9221 Personal history of antineoplastic chemotherapy: Secondary | ICD-10-CM | POA: Diagnosis not present

## 2019-09-13 DIAGNOSIS — E7800 Pure hypercholesterolemia, unspecified: Secondary | ICD-10-CM | POA: Insufficient documentation

## 2019-09-13 DIAGNOSIS — Z7952 Long term (current) use of systemic steroids: Secondary | ICD-10-CM | POA: Diagnosis not present

## 2019-09-13 DIAGNOSIS — Z791 Long term (current) use of non-steroidal anti-inflammatories (NSAID): Secondary | ICD-10-CM | POA: Insufficient documentation

## 2019-09-13 LAB — CBC WITH DIFFERENTIAL (CANCER CENTER ONLY)
Abs Immature Granulocytes: 0.05 10*3/uL (ref 0.00–0.07)
Basophils Absolute: 0 10*3/uL (ref 0.0–0.1)
Basophils Relative: 0 %
Eosinophils Absolute: 0 10*3/uL (ref 0.0–0.5)
Eosinophils Relative: 0 %
HCT: 30.6 % — ABNORMAL LOW (ref 36.0–46.0)
Hemoglobin: 10 g/dL — ABNORMAL LOW (ref 12.0–15.0)
Immature Granulocytes: 1 %
Lymphocytes Relative: 9 %
Lymphs Abs: 0.6 10*3/uL — ABNORMAL LOW (ref 0.7–4.0)
MCH: 34.1 pg — ABNORMAL HIGH (ref 26.0–34.0)
MCHC: 32.7 g/dL (ref 30.0–36.0)
MCV: 104.4 fL — ABNORMAL HIGH (ref 80.0–100.0)
Monocytes Absolute: 0.3 10*3/uL (ref 0.1–1.0)
Monocytes Relative: 4 %
Neutro Abs: 6.6 10*3/uL (ref 1.7–7.7)
Neutrophils Relative %: 86 %
Platelet Count: 351 10*3/uL (ref 150–400)
RBC: 2.93 MIL/uL — ABNORMAL LOW (ref 3.87–5.11)
RDW: 14.9 % (ref 11.5–15.5)
WBC Count: 7.6 10*3/uL (ref 4.0–10.5)
nRBC: 0 % (ref 0.0–0.2)

## 2019-09-13 LAB — CMP (CANCER CENTER ONLY)
ALT: 15 U/L (ref 0–44)
AST: 31 U/L (ref 15–41)
Albumin: 3.3 g/dL — ABNORMAL LOW (ref 3.5–5.0)
Alkaline Phosphatase: 75 U/L (ref 38–126)
Anion gap: 8 (ref 5–15)
BUN: 15 mg/dL (ref 8–23)
CO2: 25 mmol/L (ref 22–32)
Calcium: 9.4 mg/dL (ref 8.9–10.3)
Chloride: 102 mmol/L (ref 98–111)
Creatinine: 1.05 mg/dL — ABNORMAL HIGH (ref 0.44–1.00)
GFR, Est AFR Am: >60 mL/min (ref >60)
GFR, Estimated: 53 mL/min — ABNORMAL LOW (ref >60)
Glucose, Bld: 100 mg/dL — ABNORMAL HIGH (ref 70–99)
Potassium: 4.5 mmol/L (ref 3.5–5.1)
Sodium: 135 mmol/L (ref 135–145)
Total Bilirubin: 0.3 mg/dL (ref 0.3–1.2)
Total Protein: 7.2 g/dL (ref 6.5–8.1)

## 2019-09-13 MED ORDER — SODIUM CHLORIDE 0.9% FLUSH
10.0000 mL | INTRAVENOUS | Status: DC | PRN
  Administered 2019-09-13: 10 mL
  Filled 2019-09-13: qty 10

## 2019-09-13 MED ORDER — PROCHLORPERAZINE MALEATE 10 MG PO TABS
10.0000 mg | ORAL_TABLET | Freq: Once | ORAL | Status: AC
  Administered 2019-09-13: 10 mg via ORAL

## 2019-09-13 MED ORDER — SODIUM CHLORIDE 0.9% FLUSH
10.0000 mL | Freq: Once | INTRAVENOUS | Status: AC | PRN
  Administered 2019-09-13: 10 mL
  Filled 2019-09-13: qty 10

## 2019-09-13 MED ORDER — SODIUM CHLORIDE 0.9 % IV SOLN
500.0000 mg/m2 | Freq: Once | INTRAVENOUS | Status: AC
  Administered 2019-09-13: 900 mg via INTRAVENOUS
  Filled 2019-09-13: qty 16

## 2019-09-13 MED ORDER — HEPARIN SOD (PORK) LOCK FLUSH 100 UNIT/ML IV SOLN
500.0000 [IU] | Freq: Once | INTRAVENOUS | Status: AC | PRN
  Administered 2019-09-13: 500 [IU]
  Filled 2019-09-13: qty 5

## 2019-09-13 MED ORDER — CYANOCOBALAMIN 1000 MCG/ML IJ SOLN
INTRAMUSCULAR | Status: AC
  Filled 2019-09-13: qty 1

## 2019-09-13 MED ORDER — PROCHLORPERAZINE MALEATE 10 MG PO TABS
ORAL_TABLET | ORAL | Status: AC
  Filled 2019-09-13: qty 1

## 2019-09-13 MED ORDER — SODIUM CHLORIDE 0.9 % IV SOLN
Freq: Once | INTRAVENOUS | Status: AC
  Filled 2019-09-13: qty 250

## 2019-09-13 MED ORDER — BENZONATATE 200 MG PO CAPS: ORAL_CAPSULE | ORAL | 0 refills | Status: DC

## 2019-09-13 MED ORDER — CYANOCOBALAMIN 1000 MCG/ML IJ SOLN
1000.0000 ug | Freq: Once | INTRAMUSCULAR | Status: AC
  Administered 2019-09-13: 1000 ug via INTRAMUSCULAR

## 2019-09-13 MED ORDER — ZOLEDRONIC ACID 4 MG/100ML IV SOLN: 4.0000 mg | Freq: Once | INTRAVENOUS | Status: DC

## 2019-09-13 NOTE — Patient Instructions (Signed)
[facility] Discharge Instructions for Patients Receiving Chemotherapy  Today you received the following chemotherapy agents: pemetrexed.  To help prevent nausea and vomiting after your treatment, we encourage you to take your nausea medication as directed.   If you develop nausea and vomiting that is not controlled by your nausea medication, call the clinic.   BELOW ARE SYMPTOMS THAT SHOULD BE REPORTED IMMEDIATELY:  *FEVER GREATER THAN 100.5 F  *CHILLS WITH OR WITHOUT FEVER  NAUSEA AND VOMITING THAT IS NOT CONTROLLED WITH YOUR NAUSEA MEDICATION  *UNUSUAL SHORTNESS OF BREATH  *UNUSUAL BRUISING OR BLEEDING  TENDERNESS IN MOUTH AND THROAT WITH OR WITHOUT PRESENCE OF ULCERS  *URINARY PROBLEMS  *BOWEL PROBLEMS  UNUSUAL RASH Items with * indicate a potential emergency and should be followed up as soon as possible.  Feel free to call the clinic should you have any questions or concerns. The clinic phone number is [phone].  Please show the [facility] at check-in to the Emergency Department and triage nurse.

## 2019-09-13 NOTE — Patient Instructions (Signed)

## 2019-09-22 ENCOUNTER — Ambulatory Visit: Payer: Medicare Other

## 2019-09-28 NOTE — Progress Notes (Signed)

## 2019-10-02 NOTE — Progress Notes (Signed)
[place], MD [address] [place] [address]  DIAGNOSIS: Stage IV (T1b, N2, M1b) non-small cell lung cancer, adenocarcinoma diagnosed in March 2017 and presented with right lower lobe lung nodule in addition to mediastinal lymphadenopathy and metastatic bone lesions.  Molecular studies:PDL 1 TPS <1%.   [facility] Studies: Positive for ERBB2 [id number]. Negative for EGFR, KRAS, ALK, BRAF, MET, RET and ROS1.  PRIOR THERAPY:  1) Induction systemic chemotherapy with carboplatin for AUC of 5 and Alimta 500 MG/M2 every 3 weeks is status post 6 cycles at the [facility], last dose was given 03/05/2016 with stable disease. 2) palliative radiation to the metastatic bone disease in the lower back and pelvic area.  CURRENT THERAPY:  1) Maintenance systemic chemotherapy with Alimta 500 MG/M2 every 3 weeks status post61cycles. First dose was given 03/25/2016.  2) Zometa 4 mg IV every 12 week for metastatic bone disease.  INTERVAL HISTORY: Valerie Long 74 y.o. female returns to the clinic for a follow up visit. The patient is feeling well today without any concerning complaints. Her cough is improving. She endorsed a cough at her last visit. No etiology was seen at that time on her recent restaging CT scan of the chest.   The patient continues to tolerate treatment withsystemic chemotherapy with Alimtawell without any adverse sideeffectsexcept facial flushing from her steroid pre-medication. Denies any fevers, chills,or night sweats. She lost a few pounds since her last visit but she states she is eating well. Denies any hemoptysis or chest pain. She reports her baseline dyspnea on exertion. Denies any vomiting, diarrhea, or constipation but reports very mild nausea occasionally following treatment. Denies nausea following her most recent cycle of treatment. Denies any headache or visual changes. She is  here today for evaluation before starting cycle #62    MEDICAL HISTORY: Past Medical History:  Diagnosis Date  . Adenocarcinoma of right lung, stage 4 ([place]) 2017  . Anemia   . Arthritis   . Bone metastases ([place]) 02/24/2017  . Encounter for antineoplastic chemotherapy 03/17/2016  . Hypercholesterolemia   . Hypertension 06/18/2016  . Osteopenia   . 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 Pneumonia   . Shortness of breath dyspnea     ALLERGIES:  has No Known Allergies.  MEDICATIONS:  Current Outpatient Medications  Medication Sig Dispense Refill  . acetaminophen (TYLENOL) 500 MG tablet Take 1,000 mg by mouth every 6 (six) hours as needed for moderate pain or headache.    . benzonatate (TESSALON) 200 MG capsule TAKE 1 CAPSULE BY MOUTH EVERY DAY 3 TIMES A DAY AS NEEDED FOR COUGH 40 capsule 0  . Calcium Carb-Cholecalciferol (CALCIUM 600/VITAMIN D3 PO) Take by mouth.    . Cyanocobalamin (B-12 PO) Take 2 tablets by mouth daily.    Marland Kitchen dexamethasone (DECADRON) 4 MG tablet Take 87m by mouth twice daily the day before, of, and after chemo 40 tablet 1  . Fish Oil-Cholecalciferol (OMEGA-3 + VITAMIN D3 PO) Take 1 tablet by mouth daily.    . folic acid (FOLVITE) 1 MG tablet Take 1 tablet (1 mg total) by mouth daily. 90 tablet 0  . lidocaine-prilocaine (EMLA) cream Apply 1 application topically as needed. Apply 1-2 tsp over port site 1-2 hours prior to chemo. 30 g 0  . meloxicam (MOBIC) 15 MG tablet TAKE 1 TABLET BY MOUTH EVERY DAY AS NEEDED FOR PAIN 30 tablet  0  . ondansetron (ZOFRAN) 8 MG tablet Take 8 mg by mouth every 8 (eight) hours as needed.    Marland Kitchen OVER THE COUNTER MEDICATION Apply 1 drop topically daily as needed. 1 drop CBD oil in vicks vapor rub . Applied to right thigh for pain relief.    . pregabalin (LYRICA) 75 MG capsule 1 tablet daily for 10 days, then 1 tablet twice a day 60 capsule 3  . PRESCRIPTION MEDICATION Inject 1 Dose as directed every 21 ( twenty-one) days. Chemo     . prochlorperazine (COMPAZINE) 10 MG tablet Take 1 tablet (10 mg total) by mouth every 6 (six) hours as needed for nausea or vomiting. 30 tablet 0  . baclofen (LIORESAL) 10 MG tablet Take 1 tablet (10 mg total) by mouth 3 (three) times daily. (Patient not taking: Reported on 10/04/2019) 30 each 0   No current facility-administered medications for this visit.    SURGICAL HISTORY:  Past Surgical History:  Procedure Laterality Date  . CESAREAN SECTION     myomectomy  . COLONOSCOPY W/ POLYPECTOMY    . IR GENERIC HISTORICAL  08/17/2016   IR US GUIDE VASC ACCESS RIGHT 08/17/2016 [name], MD [facility]  . IR GENERIC HISTORICAL  08/17/2016   IR FLUORO GUIDE PORT INSERTION RIGHT 08/17/2016 [name], MD [facility]  . MEDIASTINOSCOPY N/A 09/25/2015   Procedure: MEDIASTINOSCOPY;  Surgeon: Melrose Nakayama, MD;  Location: [facility];  Service: Thoracic;  Laterality: N/A;  . VIDEO BRONCHOSCOPY Bilateral 08/19/2015   Procedure: VIDEO BRONCHOSCOPY WITH FLUORO;  Surgeon: Rigoberto Noel, MD;  Location: [facility];  Service: Cardiopulmonary;  Laterality: Bilateral;  . VIDEO BRONCHOSCOPY N/A 09/08/2015   Procedure: VIDEO BRONCHOSCOPY WITH FLUORO;  Surgeon: Rigoberto Noel, MD;  Location: [facility];  Service: Thoracic;  Laterality: N/A;  . VIDEO BRONCHOSCOPY WITH ENDOBRONCHIAL ULTRASOUND Right 09/08/2015   Procedure: ATTEMPTED VIDEO BRONCHOSCOPY ENDOBRONCHIAL ULTRASOUND  ;  Surgeon: Rigoberto Noel, MD;  Location: [facility];  Service: Thoracic;  Laterality: Right;    REVIEW OF SYSTEMS:   Review of Systems  Constitutional:Negative for appetite change, fatigue, chills, fever and unexpected weight change.  HENT: Negative for mouth sores, nosebleeds, sore throat and trouble swallowing.  Eyes: Negative for eye problems and icterus.  Respiratory:Positive for productive cough (improved).Negative for hemoptysis, shortness of breath and wheezing.  Cardiovascular:Positive for stable bilateral lower  extremity swelling.Negative for chest pain. Gastrointestinal:Positive for mild nausea (none at this time). Negative for constipation, diarrhea, and vomiting.  Genitourinary: Negative for bladder incontinence, difficulty urinating, dysuria, frequency and hematuria.  Musculoskeletal:Positive for chronic low back pain.Negative for gait problem, neck pain and neck stiffness.  Skin: Negative for itching and rash.  Neurological: Negative for dizziness, extremity weakness, gait problem, headaches, light-headedness and seizures.Ambulates with a cane. Hematological: Negative for adenopathy. Does not bruise/bleed easily.  Psychiatric/Behavioral: Negative for confusion, depression and sleep disturbance. The patient is not nervous/anxious.      PHYSICAL EXAMINATION:  Blood pressure (!) 143/86, pulse (!) 102, temperature 98.5 F (36.9 C), temperature source Oral, resp. rate 20, height '5\' 3"'  (1.6 m), weight 149 lb 4.8 oz (67.7 kg), last menstrual period 06/09/1998, SpO2 100 %.  ECOG PERFORMANCE STATUS: 1 - Symptomatic but completely ambulatory  Physical Exam  Constitutional: Oriented to person, place, and time and well-developed, well-nourished, and in no distress.  HENT:  Head: Normocephalic and atraumatic.  Mouth/Throat: Oropharynx is clear and moist. No oropharyngeal exudate.  Eyes: Conjunctivae are normal. Right eye exhibits no discharge.  Left eye exhibits no discharge. No scleral icterus.  Neck: Normal range of motion. Neck supple.  Cardiovascular: Normal rate, regular rhythm, normal heart sounds and intact distal pulses.  Pulmonary/Chest: Effort normal and breath sounds normal. No respiratory distress. No wheezes. No rales.  Abdominal: Soft. Bowel sounds are normal. Exhibits no distension and no mass. There is no tenderness.  Musculoskeletal: Positive for bilateral lower extremity edema (stable). Normal range of motion.  Lymphadenopathy:  No cervical adenopathy.  Neurological: Alert  and oriented to person, place, and time. Exhibits normal muscle tone. Gait normal. Coordination normal.  Skin: Skin is warm and dry. No rash noted. Not diaphoretic. No erythema. No pallor.  Psychiatric: Mood, memory and judgment normal.  Vitals reviewed.  LABORATORY DATA: Lab Results  Component Value Date   WBC 6.2 10/04/2019   HGB 10.4 (L) 10/04/2019   HCT 32.6 (L) 10/04/2019   MCV 104.5 (H) 10/04/2019   PLT 316 10/04/2019      Chemistry      Component Value Date/Time   NA 135 09/13/2019 1417   NA 137 06/09/2017 0901   K 4.5 09/13/2019 1417   K 3.9 06/09/2017 0901   CL 102 09/13/2019 1417   CO2 25 09/13/2019 1417   CO2 25 06/09/2017 0901   BUN 15 09/13/2019 1417   BUN 19.0 06/09/2017 0901   CREATININE 1.05 (H) 09/13/2019 1417   CREATININE 1.2 (H) 06/09/2017 0901   GLU 170 01/23/2016 0000      Component Value Date/Time   CALCIUM 9.4 09/13/2019 1417   CALCIUM 9.9 06/09/2017 0901   ALKPHOS 75 09/13/2019 1417   ALKPHOS 63 06/09/2017 0901   AST 31 09/13/2019 1417   AST 35 (H) 06/09/2017 0901   ALT 15 09/13/2019 1417   ALT 23 06/09/2017 0901   BILITOT 0.3 09/13/2019 1417   BILITOT 0.65 06/09/2017 0901       RADIOGRAPHIC STUDIES:  CT Chest W Contrast  Result Date: 09/10/2019 CLINICAL DATA:  Stage IV right lung cancer, restaging EXAM: CT CHEST, ABDOMEN, AND PELVIS WITH CONTRAST TECHNIQUE: Multidetector CT imaging of the chest, abdomen and pelvis was performed following the standard protocol during bolus administration of intravenous contrast. CONTRAST:  122m OMNIPAQUE IOHEXOL 300 MG/ML  SOLN COMPARISON:  06/19/2019 FINDINGS: CT CHEST FINDINGS Cardiovascular: Heart is normal in size.  No pericardial effusion. No evidence of thoracic aortic aneurysm. Mild atherosclerotic calcifications of the aortic arch. Right chest port terminates in the lower SVC. Mediastinum/Nodes: No suspicious mediastinal, hilar, or axillary lymphadenopathy. Visualized thyroid is unremarkable.  Lungs/Pleura: Multiple bilateral pulmonary nodules/masses in this patient with known multifocal adenocarcinoma. Index lesions include: --14 mm spiculated right upper lobe nodule (image 43), unchanged --10 mm irregular superior segment right lower lobe nodule (image 49), unchanged --12 mm lingular nodule (image 74), unchanged --3.3 x 3.7 cm anterior right lower lobe mass (image 81), previously 3.4 x 3.4 cm when measured in a similar fashion No focal consolidation. No pleural effusion or pneumothorax. Musculoskeletal: Multifocal sclerotic osseous metastases in the visualized axial and appendicular skeleton, grossly unchanged. CT ABDOMEN PELVIS FINDINGS Hepatobiliary: Liver is notable for two tiny 5 mm cysts. Otherwise within normal limits. Layering small gallstones (axial image 56). No associated inflammatory changes. No intrahepatic or extrahepatic ductal dilatation. Pancreas: Within normal limits. Spleen: Within normal limits. Adrenals/Urinary Tract: Adrenal glands are within normal limits. Left pelvic kidney. Right kidney is within normal limits. No hydronephrosis. Bladder is mildly thick-walled although underdistended. Stomach/Bowel: Mild wall thickening involving the distal gastric body (axial  image 60), nonspecific. Intestinal non rotation.  No evidence of bowel obstruction. Normal appendix (axial image 88). No colonic wall thickening or inflammatory changes. Vascular/Lymphatic: No evidence of abdominal aortic aneurysm. Atherosclerotic calcifications of the abdominal aorta and branch vessels. No suspicious abdominopelvic lymphadenopathy. Reproductive: Uterus is unremarkable. No adnexal masses. Other: No abdominopelvic ascites. Musculoskeletal: Multifocal osseous metastases in the visualized axial and appendicular skeleton, unchanged. IMPRESSION: Multifocal pulmonary nodules/masses in this patient with known multifocal adenocarcinoma, unchanged. Multifocal osseous metastases in the visualized axial and  appendicular skeleton, unchanged. No evidence of new/progressive metastatic disease. Electronically Signed   By: Julian Hy M.D.   On: 09/10/2019 16:05   CT Abdomen Pelvis W Contrast  Result Date: 09/10/2019 CLINICAL DATA:  Stage IV right lung cancer, restaging EXAM: CT CHEST, ABDOMEN, AND PELVIS WITH CONTRAST TECHNIQUE: Multidetector CT imaging of the chest, abdomen and pelvis was performed following the standard protocol during bolus administration of intravenous contrast. CONTRAST:  110m OMNIPAQUE IOHEXOL 300 MG/ML  SOLN COMPARISON:  06/19/2019 FINDINGS: CT CHEST FINDINGS Cardiovascular: Heart is normal in size.  No pericardial effusion. No evidence of thoracic aortic aneurysm. Mild atherosclerotic calcifications of the aortic arch. Right chest port terminates in the lower SVC. Mediastinum/Nodes: No suspicious mediastinal, hilar, or axillary lymphadenopathy. Visualized thyroid is unremarkable. Lungs/Pleura: Multiple bilateral pulmonary nodules/masses in this patient with known multifocal adenocarcinoma. Index lesions include: --14 mm spiculated right upper lobe nodule (image 43), unchanged --10 mm irregular superior segment right lower lobe nodule (image 49), unchanged --12 mm lingular nodule (image 74), unchanged --3.3 x 3.7 cm anterior right lower lobe mass (image 81), previously 3.4 x 3.4 cm when measured in a similar fashion No focal consolidation. No pleural effusion or pneumothorax. Musculoskeletal: Multifocal sclerotic osseous metastases in the visualized axial and appendicular skeleton, grossly unchanged. CT ABDOMEN PELVIS FINDINGS Hepatobiliary: Liver is notable for two tiny 5 mm cysts. Otherwise within normal limits. Layering small gallstones (axial image 56). No associated inflammatory changes. No intrahepatic or extrahepatic ductal dilatation. Pancreas: Within normal limits. Spleen: Within normal limits. Adrenals/Urinary Tract: Adrenal glands are within normal limits. Left pelvic kidney.  Right kidney is within normal limits. No hydronephrosis. Bladder is mildly thick-walled although underdistended. Stomach/Bowel: Mild wall thickening involving the distal gastric body (axial image 60), nonspecific. Intestinal non rotation.  No evidence of bowel obstruction. Normal appendix (axial image 88). No colonic wall thickening or inflammatory changes. Vascular/Lymphatic: No evidence of abdominal aortic aneurysm. Atherosclerotic calcifications of the abdominal aorta and branch vessels. No suspicious abdominopelvic lymphadenopathy. Reproductive: Uterus is unremarkable. No adnexal masses. Other: No abdominopelvic ascites. Musculoskeletal: Multifocal osseous metastases in the visualized axial and appendicular skeleton, unchanged. IMPRESSION: Multifocal pulmonary nodules/masses in this patient with known multifocal adenocarcinoma, unchanged. Multifocal osseous metastases in the visualized axial and appendicular skeleton, unchanged. No evidence of new/progressive metastatic disease. Electronically Signed   By: SJulian HyM.D.   On: 09/10/2019 16:05     ASSESSMENT/PLAN:  This is a very pleasant 74year old Hispanic female diagnosed with metastatic non-small cell lung cancer, adenocarcinoma. She presented with a right lower lobe lung nodule in addition to mediastinal lymphadenopathy and metastatic bone lesions. She was diagnosed in March 2017. Her PDL 1 expression is <1% and she has no actionable mutations.  She previously underwent inductiontreatmentwith systemic chemotherapy withCarboplatin and Alimta. She is currently undergoing maintenance therapy withAlimta. She is status post61cycles. The patient has been tolerating this treatment fairly well without any concerning adversesideeffects.  Labs were reviewed. Recommend that she proceed with cycle #62 today  as scheduled.   We will see her back for a follow up visit in 3 weeks for evaluation before starting cycle #63.   The patient  was advised to call immediately if she has any concerning symptoms in the interval. The patient voices understanding of current disease status and treatment options and is in agreement with the current care plan. All questions were answered. The patient knows to call the clinic with any problems, questions or concerns. We can certainly see the patient much sooner if necessary   No orders of the defined types were placed in this encounter.    Shaina Gullatt L Coreyon Nicotra, PA-C 10/04/19

## 2019-10-04 ENCOUNTER — Other Ambulatory Visit: Payer: Self-pay

## 2019-10-04 ENCOUNTER — Inpatient Hospital Stay: Payer: Medicare Other

## 2019-10-04 ENCOUNTER — Inpatient Hospital Stay (HOSPITAL_BASED_OUTPATIENT_CLINIC_OR_DEPARTMENT_OTHER): Payer: Medicare Other | Admitting: Physician Assistant

## 2019-10-04 VITALS — BP 143/86 | HR 102 | Temp 98.5°F | Resp 20 | Ht 63.0 in | Wt 149.3 lb

## 2019-10-04 DIAGNOSIS — C3491 Malignant neoplasm of unspecified part of right bronchus or lung: Secondary | ICD-10-CM

## 2019-10-04 DIAGNOSIS — I10 Essential (primary) hypertension: Secondary | ICD-10-CM | POA: Diagnosis not present

## 2019-10-04 DIAGNOSIS — Z95828 Presence of other vascular implants and grafts: Secondary | ICD-10-CM

## 2019-10-04 DIAGNOSIS — E7800 Pure hypercholesterolemia, unspecified: Secondary | ICD-10-CM | POA: Diagnosis not present

## 2019-10-04 DIAGNOSIS — M8580 Other specified disorders of bone density and structure, unspecified site: Secondary | ICD-10-CM | POA: Diagnosis not present

## 2019-10-04 DIAGNOSIS — C7951 Secondary malignant neoplasm of bone: Secondary | ICD-10-CM | POA: Diagnosis not present

## 2019-10-04 DIAGNOSIS — Z5111 Encounter for antineoplastic chemotherapy: Secondary | ICD-10-CM

## 2019-10-04 DIAGNOSIS — C3431 Malignant neoplasm of lower lobe, right bronchus or lung: Secondary | ICD-10-CM | POA: Diagnosis not present

## 2019-10-04 LAB — CBC WITH DIFFERENTIAL (CANCER CENTER ONLY)
Abs Immature Granulocytes: 0.02 10*3/uL (ref 0.00–0.07)
Basophils Absolute: 0 10*3/uL (ref 0.0–0.1)
Basophils Relative: 0 %
Eosinophils Absolute: 0 10*3/uL (ref 0.0–0.5)
Eosinophils Relative: 0 %
HCT: 32.6 % — ABNORMAL LOW (ref 36.0–46.0)
Hemoglobin: 10.4 g/dL — ABNORMAL LOW (ref 12.0–15.0)
Immature Granulocytes: 0 %
Lymphocytes Relative: 15 %
Lymphs Abs: 1 10*3/uL (ref 0.7–4.0)
MCH: 33.3 pg (ref 26.0–34.0)
MCHC: 31.9 g/dL (ref 30.0–36.0)
MCV: 104.5 fL — ABNORMAL HIGH (ref 80.0–100.0)
Monocytes Absolute: 0.7 10*3/uL (ref 0.1–1.0)
Monocytes Relative: 11 %
Neutro Abs: 4.5 10*3/uL (ref 1.7–7.7)
Neutrophils Relative %: 74 %
Platelet Count: 316 10*3/uL (ref 150–400)
RBC: 3.12 MIL/uL — ABNORMAL LOW (ref 3.87–5.11)
RDW: 15.2 % (ref 11.5–15.5)
WBC Count: 6.2 10*3/uL (ref 4.0–10.5)
nRBC: 0 % (ref 0.0–0.2)

## 2019-10-04 LAB — CMP (CANCER CENTER ONLY)
ALT: 12 U/L (ref 0–44)
AST: 27 U/L (ref 15–41)
Albumin: 3.3 g/dL — ABNORMAL LOW (ref 3.5–5.0)
Alkaline Phosphatase: 66 U/L (ref 38–126)
Anion gap: 9 (ref 5–15)
BUN: 18 mg/dL (ref 8–23)
CO2: 25 mmol/L (ref 22–32)
Calcium: 9.4 mg/dL (ref 8.9–10.3)
Chloride: 106 mmol/L (ref 98–111)
Creatinine: 1.17 mg/dL — ABNORMAL HIGH (ref 0.44–1.00)
GFR, Est AFR Am: 54 mL/min — ABNORMAL LOW (ref >60)
GFR, Estimated: 47 mL/min — ABNORMAL LOW (ref >60)
Glucose, Bld: 123 mg/dL — ABNORMAL HIGH (ref 70–99)
Potassium: 3.7 mmol/L (ref 3.5–5.1)
Sodium: 140 mmol/L (ref 135–145)
Total Bilirubin: 0.5 mg/dL (ref 0.3–1.2)
Total Protein: 7 g/dL (ref 6.5–8.1)

## 2019-10-04 MED ORDER — ZOLEDRONIC ACID 4 MG/100ML IV SOLN
INTRAVENOUS | Status: AC
  Filled 2019-10-04: qty 100

## 2019-10-04 MED ORDER — ZOLEDRONIC ACID 4 MG/100ML IV SOLN
4.0000 mg | Freq: Once | INTRAVENOUS | Status: AC
  Administered 2019-10-04: 4 mg via INTRAVENOUS

## 2019-10-04 MED ORDER — PROCHLORPERAZINE MALEATE 10 MG PO TABS
ORAL_TABLET | ORAL | Status: AC
  Filled 2019-10-04: qty 1

## 2019-10-04 MED ORDER — PROCHLORPERAZINE MALEATE 10 MG PO TABS
10.0000 mg | ORAL_TABLET | Freq: Once | ORAL | Status: AC
  Administered 2019-10-04: 10 mg via ORAL

## 2019-10-04 MED ORDER — SODIUM CHLORIDE 0.9 % IV SOLN
Freq: Once | INTRAVENOUS | Status: AC
  Filled 2019-10-04: qty 250

## 2019-10-04 MED ORDER — HEPARIN SOD (PORK) LOCK FLUSH 100 UNIT/ML IV SOLN
500.0000 [IU] | Freq: Once | INTRAVENOUS | Status: AC | PRN
  Administered 2019-10-04: 500 [IU]
  Filled 2019-10-04: qty 5

## 2019-10-04 MED ORDER — SODIUM CHLORIDE 0.9% FLUSH
10.0000 mL | INTRAVENOUS | Status: DC | PRN
  Administered 2019-10-04: 10 mL
  Filled 2019-10-04: qty 10

## 2019-10-04 MED ORDER — SODIUM CHLORIDE 0.9% FLUSH
10.0000 mL | Freq: Once | INTRAVENOUS | Status: AC | PRN
  Administered 2019-10-04: 10 mL
  Filled 2019-10-04: qty 10

## 2019-10-04 MED ORDER — SODIUM CHLORIDE 0.9 % IV SOLN
500.0000 mg/m2 | Freq: Once | INTRAVENOUS | Status: AC
  Administered 2019-10-04: 900 mg via INTRAVENOUS
  Filled 2019-10-04: qty 20

## 2019-10-04 NOTE — Progress Notes (Signed)
Per Md okay to treat with HR 102

## 2019-10-04 NOTE — Patient Instructions (Signed)
[facility] Discharge Instructions for Patients Receiving Chemotherapy  Today you received the following chemotherapy agents Alimta  To help prevent nausea and vomiting after your treatment, we encourage you to take your nausea medication as directed   If you develop nausea and vomiting that is not controlled by your nausea medication, call the clinic.   BELOW ARE SYMPTOMS THAT SHOULD BE REPORTED IMMEDIATELY:  *FEVER GREATER THAN 100.5 F  *CHILLS WITH OR WITHOUT FEVER  NAUSEA AND VOMITING THAT IS NOT CONTROLLED WITH YOUR NAUSEA MEDICATION  *UNUSUAL SHORTNESS OF BREATH  *UNUSUAL BRUISING OR BLEEDING  TENDERNESS IN MOUTH AND THROAT WITH OR WITHOUT PRESENCE OF ULCERS  *URINARY PROBLEMS  *BOWEL PROBLEMS  UNUSUAL RASH Items with * indicate a potential emergency and should be followed up as soon as possible.  Feel free to call the clinic should you have any questions or concerns. The clinic phone number is [phone].  Please show the [facility] [place] at check-in to the Emergency Department and triage nurse.

## 2019-10-05 ENCOUNTER — Telehealth: Payer: Self-pay | Admitting: Internal Medicine

## 2019-10-05 NOTE — Telephone Encounter (Signed)
Scheduled per los. Called and left msg. Mailed printout  °

## 2019-10-09 ENCOUNTER — Ambulatory Visit: Payer: Medicare Other | Admitting: Internal Medicine

## 2019-10-10 ENCOUNTER — Ambulatory Visit: Payer: Medicare Other | Admitting: Internal Medicine

## 2019-10-16 ENCOUNTER — Encounter: Payer: Self-pay | Admitting: Internal Medicine

## 2019-10-16 ENCOUNTER — Ambulatory Visit (INDEPENDENT_AMBULATORY_CARE_PROVIDER_SITE_OTHER): Payer: Medicare Other | Admitting: Internal Medicine

## 2019-10-16 ENCOUNTER — Ambulatory Visit: Payer: Medicare Other | Admitting: Internal Medicine

## 2019-10-16 ENCOUNTER — Other Ambulatory Visit: Payer: Self-pay

## 2019-10-16 VITALS — BP 136/82 | HR 95 | Temp 96.9°F | Resp 18 | Ht 63.0 in | Wt 147.1 lb

## 2019-10-16 DIAGNOSIS — C3491 Malignant neoplasm of unspecified part of right bronchus or lung: Secondary | ICD-10-CM | POA: Diagnosis not present

## 2019-10-16 DIAGNOSIS — M8580 Other specified disorders of bone density and structure, unspecified site: Secondary | ICD-10-CM

## 2019-10-16 DIAGNOSIS — M545 Low back pain, unspecified: Secondary | ICD-10-CM

## 2019-10-16 DIAGNOSIS — G8929 Other chronic pain: Secondary | ICD-10-CM

## 2019-10-16 DIAGNOSIS — E7800 Pure hypercholesterolemia, unspecified: Secondary | ICD-10-CM

## 2019-10-16 MED ORDER — BENZONATATE 200 MG PO CAPS: ORAL_CAPSULE | ORAL | 0 refills | Status: DC

## 2019-10-16 NOTE — Patient Instructions (Addendum)
Please schedule Medicare Wellness with Glenard Haring.     GO TO THE FRONT DESK, PLEASE SCHEDULE YOUR APPOINTMENTS Come back for a check up in 6 months

## 2019-10-16 NOTE — Progress Notes (Signed)
Pre visit review using our clinic review tool, if applicable. No additional management support is needed unless otherwise documented below in the visit note. 

## 2019-10-16 NOTE — Progress Notes (Signed)
Subjective:    Patient ID: Valerie Long, female    DOB: August 30, 1945, 74 y.o.   MRN: [id number]  DOS:  10/16/2019 Type of visit - description: Follow-up Chronic issues discussed. Doing about the same. Medication list reviewed. Continue with back pain with some left leg paresthesias on and off    Review of Systems See above   Past Medical History:  Diagnosis Date  . Adenocarcinoma of right lung, stage 4 ([place]) 2017  . Anemia   . Arthritis   . Bone metastases ([place]) 02/24/2017  . Encounter for antineoplastic chemotherapy 03/17/2016  . Hypercholesterolemia   . Hypertension 06/18/2016  . Osteopenia   . 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 Pneumonia   . Shortness of breath dyspnea     Past Surgical History:  Procedure Laterality Date  . CESAREAN SECTION     myomectomy  . COLONOSCOPY W/ POLYPECTOMY    . IR GENERIC HISTORICAL  08/17/2016   IR US GUIDE VASC ACCESS RIGHT 08/17/2016 [name], MD [facility]  . IR GENERIC HISTORICAL  08/17/2016   IR FLUORO GUIDE PORT INSERTION RIGHT 08/17/2016 [name], MD [facility]  . MEDIASTINOSCOPY N/A 09/25/2015   Procedure: MEDIASTINOSCOPY;  Surgeon: Melrose Nakayama, MD;  Location: [facility];  Service: Thoracic;  Laterality: N/A;  . VIDEO BRONCHOSCOPY Bilateral 08/19/2015   Procedure: VIDEO BRONCHOSCOPY WITH FLUORO;  Surgeon: Rigoberto Noel, MD;  Location: [facility];  Service: Cardiopulmonary;  Laterality: Bilateral;  . VIDEO BRONCHOSCOPY N/A 09/08/2015   Procedure: VIDEO BRONCHOSCOPY WITH FLUORO;  Surgeon: Rigoberto Noel, MD;  Location: [facility];  Service: Thoracic;  Laterality: N/A;  . VIDEO BRONCHOSCOPY WITH ENDOBRONCHIAL ULTRASOUND Right 09/08/2015   Procedure: ATTEMPTED VIDEO BRONCHOSCOPY ENDOBRONCHIAL ULTRASOUND  ;  Surgeon: Rigoberto Noel, MD;  Location: [facility];  Service: Thoracic;  Laterality: Right;    Allergies as of 10/16/2019   No Known Allergies     Medication List       Accurate as of Oct 16, 2019  11:59 PM. If you have any questions, ask your nurse or doctor.        STOP taking these medications   baclofen 10 MG tablet Commonly known as: LIORESAL Stopped by: [name], MD   pregabalin 75 MG capsule Commonly known as: LYRICA Stopped by: [name], MD   prochlorperazine 10 MG tablet Commonly known as: COMPAZINE Stopped by: [name], MD     TAKE these medications   acetaminophen 500 MG tablet Commonly known as: TYLENOL Take 1,000 mg by mouth every 6 (six) hours as needed for moderate pain or headache.   B-12 PO Take 2 tablets by mouth daily.   benzonatate 200 MG capsule Commonly known as: TESSALON TAKE 1 CAPSULE BY MOUTH EVERY DAY 3 TIMES A DAY AS NEEDED FOR COUGH   CALCIUM 600/VITAMIN D3 PO Take by mouth.   dexamethasone 4 MG tablet Commonly known as: DECADRON Take 4mg  by mouth twice daily the day before, of, and after chemo   folic acid 1 MG tablet Commonly known as: FOLVITE Take 1 tablet (1 mg total) by mouth daily.   lidocaine-prilocaine cream Commonly known as: EMLA Apply 1 application topically as needed. Apply 1-2 tsp over port site 1-2 hours prior to chemo.   meloxicam 15 MG tablet Commonly known as: MOBIC TAKE 1 TABLET BY MOUTH EVERY DAY AS NEEDED FOR PAIN   OMEGA-3 + VITAMIN D3 PO Take 1 tablet by mouth daily.  ondansetron 8 MG tablet Commonly known as: ZOFRAN Take 8 mg by mouth every 8 (eight) hours as needed.   OVER THE COUNTER MEDICATION Apply 1 drop topically daily as needed. 1 drop CBD oil in vicks vapor rub . Applied to right thigh for pain relief.   PRESCRIPTION MEDICATION Inject 1 Dose as directed every 21 ( twenty-one) days. Chemo          Objective:   Physical Exam BP 136/82 (BP Location: Right Arm, Patient Position: Sitting, Cuff Size: Small)   Pulse 95   Temp (!) 96.9 F (36.1 C) (Temporal)   Resp 18   Ht 5\' 3"  (1.6 m)   Wt 147 lb 2 oz (66.7 kg)   LMP 06/09/1998   SpO2 100%   BMI 26.06 kg/m  General:   Well  developed, NAD, BMI noted. HEENT:  Normocephalic . Face symmetric, atraumatic Lungs:  CTA B Normal respiratory effort, no intercostal retractions, no accessory muscle use. Heart: RRR,  no murmur.  Lower extremities: no pretibial edema bilaterally  Skin: Not pale. Not jaundice Neurologic:  alert & oriented X3.  Speech normal, gait appropriate for age and unassisted Psych--  Cognition and judgment appear intact.  Cooperative with normal attention span and concentration.  Behavior appropriate. No anxious or depressed appearing.      Assessment       Assessment  Hyperlipidemia Osteopenia Left pelvic kidney -- by CT done at [place]  Non-small cell lung cancer-Stage IV (T1b, N2, M1b) . Dx  March 2017  PLAN: Hyperlipidemia: Based on last cholesterol panel, CV RF 10 years is 13%, we discussed statins, she declines.  Prefers to continue working on her lifestyle Non-small cell lung cancer, adenocarcinoma, metastatic.  On maintenance therapy with Alimta.  Rarely takes nausea medication, on Decadron around the time of chemotherapy. Back pain: Ongoing, previously seen by Dr. 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  Currently with back pain and some left leg paresthesias on and off.  Lyrica did not help.  Currently on Tylenol and occasional meloxicam. Declines further evaluation at this point, symptoms are stable compared to last year. Osteopenia: Last DEXA 2018 in [facility] gynecologist, plans to go back there at some point. Preventive care: Reviewed RTC 6 months    This visit occurred during the SARS-CoV-2 public health emergency.  Safety protocols were in place, including screening questions prior to the visit, additional usage of staff PPE, and extensive cleaning of exam room while observing appropriate contact time as indicated for disinfecting solutions.

## 2019-10-17 NOTE — Assessment & Plan Note (Signed)
Hyperlipidemia: Based on last cholesterol panel, CV RF 10 years is 13%, we discussed statins, she declines.  Prefers to continue working on her lifestyle Non-small cell lung cancer, adenocarcinoma, metastatic.  On maintenance therapy with Alimta.  Rarely takes nausea medication, on Decadron around the time of chemotherapy. Back pain: Ongoing, previously seen by Dr. 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  Currently with back pain and some left leg paresthesias on and off.  Lyrica did not help.  Currently on Tylenol and occasional meloxicam. Declines further evaluation at this point, symptoms are stable compared to last year. Osteopenia: Last DEXA 2018 in [facility] gynecologist, plans to go back there at some point. Preventive care: Reviewed RTC 6 months

## 2019-10-17 NOTE — Assessment & Plan Note (Signed)
Had her first Covid shot Pap smears: Last in 2018, unsure if she likes to proceed with more screening. CCS: We again discussed colonoscopy, does not like to proceed Due for a mammogram, plans to call and schedule

## 2019-10-19 NOTE — Progress Notes (Signed)
Pharmacist Chemotherapy Monitoring - Follow Up Assessment    I verify that I have reviewed each item in the below checklist:  . Regimen for the patient is scheduled for the appropriate day and plan matches scheduled date. Marland Kitchen Appropriate non-routine labs are ordered dependent on drug ordered. . If applicable, additional medications reviewed and ordered per protocol based on lifetime cumulative doses and/or treatment regimen.   Plan for follow-up and/or issues identified: No . I-vent associated with next due treatment: No . MD and/or nursing notified: No  Valerie Long K 10/19/2019 9:38 AM

## 2019-10-24 NOTE — Progress Notes (Signed)
[place], MD [address] [place] [address]  DIAGNOSIS: Stage IV (T1b, N2, M1b) non-small cell lung cancer, adenocarcinoma diagnosed in March 2017 and presented with right lower lobe lung nodule in addition to mediastinal lymphadenopathy and metastatic bone lesions.  Molecular studies:PDL 1 TPS <1%.   [employer] One Studies: Positive for ERBB2 [id number]. Negative for EGFR, KRAS, ALK, BRAF, MET, RET and ROS1.  PRIOR THERAPY: 1) Induction systemic chemotherapy with carboplatin for AUC of 5 and Alimta 500 MG/M2 every 3 weeks is status post 6 cycles at the [facility], last dose was given 03/05/2016 with stable disease. 2) palliative radiation to the metastatic bone disease in the lower back and pelvic area.  CURRENT THERAPY: 1) Maintenance systemic chemotherapy with Alimta 500 MG/M2 every 3 weeks status post62cycles. First dose was given 03/25/2016.  2) Zometa 4 mg IV every 12 week for metastatic bone disease.  INTERVAL HISTORY: Valerie Long 74 y.o. female returns to the clinic for a follow up visit. patient is feeling well today without any concerning complaints. The patient continues to tolerate treatment withsystemic chemotherapy with Alimtawell without any adverse sideeffectsexcept facial flushing from her steroid pre-medication. Denies anyfevers, chills,weight loss, or night sweats. Denies any hemoptysisor chest pain. She reports her baseline dyspnea on exertion. She has had a cough since the fall of 2020. No acute etiology has been noted on any of her restaging CT scans. She states it is a little better because she started drinking tea before bed which has been helping. She also has tessalon prescribed as well. Denies any vomiting, diarrhea, or constipationbut reports very mild nausea occasionally following treatment. Denies nausea following her most recent cycle of treatment. Denies any  headache or visual changes.She is here today for evaluationbefore starting cycle #63  MEDICAL HISTORY: Past Medical History:  Diagnosis Date  . Adenocarcinoma of right lung, stage 4 ([place]) 2017  . Anemia   . Arthritis   . Bone metastases ([place]) 02/24/2017  . Encounter for antineoplastic chemotherapy 03/17/2016  . Hypercholesterolemia   . Hypertension 06/18/2016  . Osteopenia   . 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 Pneumonia   . Shortness of breath dyspnea     ALLERGIES:  has No Known Allergies.  MEDICATIONS:  Current Outpatient Medications  Medication Sig Dispense Refill  . acetaminophen (TYLENOL) 500 MG tablet Take 1,000 mg by mouth every 6 (six) hours as needed for moderate pain or headache.    . benzonatate (TESSALON) 200 MG capsule TAKE 1 CAPSULE BY MOUTH EVERY DAY 3 TIMES A DAY AS NEEDED FOR COUGH 40 capsule 0  . Calcium Carb-Cholecalciferol (CALCIUM 600/VITAMIN D3 PO) Take by mouth.    . Cyanocobalamin (B-12 PO) Take 2 tablets by mouth daily.    Marland Kitchen dexamethasone (DECADRON) 4 MG tablet Take 80m by mouth twice daily the day before, of, and after chemo 40 tablet 1  . Fish Oil-Cholecalciferol (OMEGA-3 + VITAMIN D3 PO) Take 1 tablet by mouth daily.    . folic acid (FOLVITE) 1 MG tablet Take 1 tablet (1 mg total) by mouth daily. 90 tablet 0  . lidocaine-prilocaine (EMLA) cream Apply 1 application topically as needed. Apply 1-2 tsp over port site 1-2 hours prior to chemo. 30 g 0  . ondansetron (ZOFRAN) 8 MG tablet Take 8 mg by mouth every 8 (eight) hours as needed.    .Marland KitchenOVER THE COUNTER MEDICATION  Apply 1 drop topically daily as needed. 1 drop CBD oil in vicks vapor rub . Applied to right thigh for pain relief.    Marland Kitchen PRESCRIPTION MEDICATION Inject 1 Dose as directed every 21 ( twenty-one) days. Chemo    . meloxicam (MOBIC) 15 MG tablet TAKE 1 TABLET BY MOUTH EVERY DAY AS NEEDED FOR PAIN (Patient not taking: Reported on 10/16/2019) 30 tablet 0   No current  facility-administered medications for this visit.    SURGICAL HISTORY:  Past Surgical History:  Procedure Laterality Date  . CESAREAN SECTION     myomectomy  . COLONOSCOPY W/ POLYPECTOMY    . IR GENERIC HISTORICAL  08/17/2016   IR US GUIDE VASC ACCESS RIGHT 08/17/2016 [name], MD [facility]  . IR GENERIC HISTORICAL  08/17/2016   IR FLUORO GUIDE PORT INSERTION RIGHT 08/17/2016 [name], MD [facility]  . MEDIASTINOSCOPY N/A 09/25/2015   Procedure: MEDIASTINOSCOPY;  Surgeon: Melrose Nakayama, MD;  Location: [facility];  Service: Thoracic;  Laterality: N/A;  . VIDEO BRONCHOSCOPY Bilateral 08/19/2015   Procedure: VIDEO BRONCHOSCOPY WITH FLUORO;  Surgeon: Rigoberto Noel, MD;  Location: [place];  Service: Cardiopulmonary;  Laterality: Bilateral;  . VIDEO BRONCHOSCOPY N/A 09/08/2015   Procedure: VIDEO BRONCHOSCOPY WITH FLUORO;  Surgeon: Rigoberto Noel, MD;  Location: [place];  Service: Thoracic;  Laterality: N/A;  . VIDEO BRONCHOSCOPY WITH ENDOBRONCHIAL ULTRASOUND Right 09/08/2015   Procedure: ATTEMPTED VIDEO BRONCHOSCOPY ENDOBRONCHIAL ULTRASOUND  ;  Surgeon: Rigoberto Noel, MD;  Location: [place];  Service: Thoracic;  Laterality: Right;    REVIEW OF SYSTEMS:   Constitutional:Negative for appetite change,fatigue,chills, fever and unexpected weight change.  HENT: Negative for mouth sores, nosebleeds, sore throat and trouble swallowing.  Eyes: Negative for eye problems and icterus.  Respiratory:Positive for productive cough (improved).Negative for hemoptysis, shortness of breath and wheezing.  Cardiovascular:Positive for stable bilateral lower extremityswelling.Negative for chest pain. Gastrointestinal:Positive for mild nausea (none at this time).Negative for constipation, diarrhea, and vomiting.  Genitourinary: Negative for bladder incontinence, difficulty urinating, dysuria, frequency and hematuria.  Musculoskeletal:Positive for chronic low back pain.Negative for gait  problem, neck pain and neck stiffness.  Skin: Negative for itching and rash.  Neurological: Negative for dizziness, extremity weakness, gait problem, headaches, light-headedness and seizures.Ambulates with a cane. Hematological: Negative for adenopathy. Does not bruise/bleed easily.  Psychiatric/Behavioral: Negative for confusion, depression and sleep disturbance. The patient is not nervous/anxious.   PHYSICAL EXAMINATION:  Blood pressure (!) 150/84, pulse 82, temperature 97.9 F (36.6 C), temperature source Temporal, resp. rate 18, height _0  (1.6 m), weight 152 lb 1.6 oz (69 kg), last menstrual period 06/09/1998, SpO2 100 %.  ECOG PERFORMANCE STATUS: 1 - Symptomatic but completely ambulatory  Physical Exam  Constitutional: Oriented to person, place, and time and well-developed, well-nourished, and in no distress.  HENT:  Head: Normocephalic and atraumatic.  Mouth/Throat: Oropharynx is clear and moist. No oropharyngeal exudate.  Eyes: Conjunctivae are normal. Right eye exhibits no discharge. Left eye exhibits no discharge. No scleral icterus.  Neck: Normal range of motion. Neck supple.  Cardiovascular: Normal rate, regular rhythm, normal heart sounds and intact distal pulses.  Pulmonary/Chest: Effort normal and breath sounds normal. No respiratory distress. No wheezes. No rales.  Abdominal: Soft. Bowel sounds are normal. Exhibits no distension and no mass. There is no tenderness.  Musculoskeletal: Positive for bilateral lower extremity edema (stable).Normal range of motion.  Lymphadenopathy:  No cervical adenopathy.  Neurological: Alert and oriented to person, place, and time. Exhibits  normal muscle tone. Gait normal. Coordination normal.  Skin: Skin is warm and dry. No rash noted. Not diaphoretic. No erythema. No pallor.  Psychiatric: Mood, memory and judgment normal.  Vitals reviewed.  LABORATORY DATA: Lab Results  Component Value Date   WBC 6.6 10/25/2019   HGB 10.0  (L) 10/25/2019   HCT 30.4 (L) 10/25/2019   MCV 101.7 (H) 10/25/2019   PLT 286 10/25/2019      Chemistry      Component Value Date/Time   NA 138 10/25/2019 1008   NA 137 06/09/2017 0901   K 3.8 10/25/2019 1008   K 3.9 06/09/2017 0901   CL 105 10/25/2019 1008   CO2 25 10/25/2019 1008   CO2 25 06/09/2017 0901   BUN 17 10/25/2019 1008   BUN 19.0 06/09/2017 0901   CREATININE 1.00 10/25/2019 1008   CREATININE 1.2 (H) 06/09/2017 0901   GLU 170 01/23/2016 0000      Component Value Date/Time   CALCIUM 9.5 10/25/2019 1008   CALCIUM 9.9 06/09/2017 0901   ALKPHOS 67 10/25/2019 1008   ALKPHOS 63 06/09/2017 0901   AST 28 10/25/2019 1008   AST 35 (H) 06/09/2017 0901   ALT 13 10/25/2019 1008   ALT 23 06/09/2017 0901   BILITOT 0.4 10/25/2019 1008   BILITOT 0.65 06/09/2017 0901       RADIOGRAPHIC STUDIES:  No results found.   ASSESSMENT/PLAN:  This is a very pleasant 74 year old Hispanic female diagnosed with metastatic non-small cell lung cancer, adenocarcinoma. She presented with a right lower lobe lung nodule in addition to mediastinal lymphadenopathy and metastatic bone lesions. She was diagnosed in March 2017. Her PDL 1 expression is <1% and she has no actionable mutations.  She previously underwent inductiontreatmentwith systemic chemotherapy withCarboplatin and Alimta. She is currently undergoing maintenance therapy withAlimta. She is status post62cycles. The patient has been tolerating this treatment fairly well without any concerning adversesideeffects.  Labs were reviewed. Recommend that she proceed with cycle #63 today as scheduled.   We will see her back for a follow up visit in 3 weeks for evaluation before starting cycle #64.  The patient mentions skin darkening near her heel/posterior ankle bilaterally but R>L. This area appeared dry on exam. No erythema, no warmth, no swelling, no pain. Patient has chronic venous insuffiencey. Could be related to her  chronic venous insuffiencey vs skin dryness. Patient advised to moisturize this area as well as elevate her legs and use her compression stockings. Discussed concerning skin changes which would warrant medical evaluation including fevers, swelling/erythema, pain, warmth, etc.   The patient was advised to call immediately if she has any concerning symptoms in the interval. The patient voices understanding of current disease status and treatment options and is in agreement with the current care plan. All questions were answered. The patient knows to call the clinic with any problems, questions or concerns. We can certainly see the patient much sooner if necessary   No orders of the defined types were placed in this encounter.    Zakayla Martinec L Nychelle Cassata, PA-C 10/25/19

## 2019-10-25 ENCOUNTER — Other Ambulatory Visit: Payer: Self-pay

## 2019-10-25 ENCOUNTER — Inpatient Hospital Stay: Payer: Medicare Other

## 2019-10-25 ENCOUNTER — Inpatient Hospital Stay: Payer: Medicare Other | Attending: Internal Medicine | Admitting: Physician Assistant

## 2019-10-25 VITALS — BP 150/84 | HR 82 | Temp 97.9°F | Resp 18 | Ht 63.0 in | Wt 152.1 lb

## 2019-10-25 DIAGNOSIS — C3431 Malignant neoplasm of lower lobe, right bronchus or lung: Secondary | ICD-10-CM | POA: Insufficient documentation

## 2019-10-25 DIAGNOSIS — I10 Essential (primary) hypertension: Secondary | ICD-10-CM | POA: Diagnosis not present

## 2019-10-25 DIAGNOSIS — Z95828 Presence of other vascular implants and grafts: Secondary | ICD-10-CM

## 2019-10-25 DIAGNOSIS — C3491 Malignant neoplasm of unspecified part of right bronchus or lung: Secondary | ICD-10-CM | POA: Diagnosis not present

## 2019-10-25 DIAGNOSIS — Z79899 Other long term (current) drug therapy: Secondary | ICD-10-CM | POA: Diagnosis not present

## 2019-10-25 DIAGNOSIS — Z5111 Encounter for antineoplastic chemotherapy: Secondary | ICD-10-CM | POA: Insufficient documentation

## 2019-10-25 DIAGNOSIS — Z791 Long term (current) use of non-steroidal anti-inflammatories (NSAID): Secondary | ICD-10-CM | POA: Insufficient documentation

## 2019-10-25 DIAGNOSIS — C7951 Secondary malignant neoplasm of bone: Secondary | ICD-10-CM | POA: Insufficient documentation

## 2019-10-25 DIAGNOSIS — M8580 Other specified disorders of bone density and structure, unspecified site: Secondary | ICD-10-CM | POA: Insufficient documentation

## 2019-10-25 DIAGNOSIS — Z7952 Long term (current) use of systemic steroids: Secondary | ICD-10-CM | POA: Diagnosis not present

## 2019-10-25 DIAGNOSIS — E7800 Pure hypercholesterolemia, unspecified: Secondary | ICD-10-CM | POA: Diagnosis not present

## 2019-10-25 LAB — CMP (CANCER CENTER ONLY)
ALT: 13 U/L (ref 0–44)
AST: 28 U/L (ref 15–41)
Albumin: 3.1 g/dL — ABNORMAL LOW (ref 3.5–5.0)
Alkaline Phosphatase: 67 U/L (ref 38–126)
Anion gap: 8 (ref 5–15)
BUN: 17 mg/dL (ref 8–23)
CO2: 25 mmol/L (ref 22–32)
Calcium: 9.5 mg/dL (ref 8.9–10.3)
Chloride: 105 mmol/L (ref 98–111)
Creatinine: 1 mg/dL (ref 0.44–1.00)
GFR, Est AFR Am: >60 mL/min (ref >60)
GFR, Estimated: 56 mL/min — ABNORMAL LOW (ref >60)
Glucose, Bld: 103 mg/dL — ABNORMAL HIGH (ref 70–99)
Potassium: 3.8 mmol/L (ref 3.5–5.1)
Sodium: 138 mmol/L (ref 135–145)
Total Bilirubin: 0.4 mg/dL (ref 0.3–1.2)
Total Protein: 6.7 g/dL (ref 6.5–8.1)

## 2019-10-25 LAB — CBC WITH DIFFERENTIAL (CANCER CENTER ONLY)
Abs Immature Granulocytes: 0.02 10*3/uL (ref 0.00–0.07)
Basophils Absolute: 0 10*3/uL (ref 0.0–0.1)
Basophils Relative: 0 %
Eosinophils Absolute: 0 10*3/uL (ref 0.0–0.5)
Eosinophils Relative: 0 %
HCT: 30.4 % — ABNORMAL LOW (ref 36.0–46.0)
Hemoglobin: 10 g/dL — ABNORMAL LOW (ref 12.0–15.0)
Immature Granulocytes: 0 %
Lymphocytes Relative: 14 %
Lymphs Abs: 0.9 10*3/uL (ref 0.7–4.0)
MCH: 33.4 pg (ref 26.0–34.0)
MCHC: 32.9 g/dL (ref 30.0–36.0)
MCV: 101.7 fL — ABNORMAL HIGH (ref 80.0–100.0)
Monocytes Absolute: 0.8 10*3/uL (ref 0.1–1.0)
Monocytes Relative: 13 %
Neutro Abs: 4.8 10*3/uL (ref 1.7–7.7)
Neutrophils Relative %: 73 %
Platelet Count: 286 10*3/uL (ref 150–400)
RBC: 2.99 MIL/uL — ABNORMAL LOW (ref 3.87–5.11)
RDW: 14.7 % (ref 11.5–15.5)
WBC Count: 6.6 10*3/uL (ref 4.0–10.5)
nRBC: 0 % (ref 0.0–0.2)

## 2019-10-25 MED ORDER — SODIUM CHLORIDE 0.9% FLUSH
10.0000 mL | INTRAVENOUS | Status: DC | PRN
  Administered 2019-10-25: 10 mL
  Filled 2019-10-25: qty 10

## 2019-10-25 MED ORDER — PROCHLORPERAZINE MALEATE 10 MG PO TABS
10.0000 mg | ORAL_TABLET | Freq: Once | ORAL | Status: AC
  Administered 2019-10-25: 10 mg via ORAL

## 2019-10-25 MED ORDER — HEPARIN SOD (PORK) LOCK FLUSH 100 UNIT/ML IV SOLN
500.0000 [IU] | Freq: Once | INTRAVENOUS | Status: AC | PRN
  Administered 2019-10-25: 500 [IU]
  Filled 2019-10-25: qty 5

## 2019-10-25 MED ORDER — SODIUM CHLORIDE 0.9 % IV SOLN
Freq: Once | INTRAVENOUS | Status: AC
  Filled 2019-10-25: qty 250

## 2019-10-25 MED ORDER — SODIUM CHLORIDE 0.9 % IV SOLN
500.0000 mg/m2 | Freq: Once | INTRAVENOUS | Status: AC
  Administered 2019-10-25: 900 mg via INTRAVENOUS
  Filled 2019-10-25: qty 16

## 2019-10-25 MED ORDER — SODIUM CHLORIDE 0.9% FLUSH
10.0000 mL | Freq: Once | INTRAVENOUS | Status: AC | PRN
  Administered 2019-10-25: 10 mL
  Filled 2019-10-25: qty 10

## 2019-10-25 MED ORDER — PROCHLORPERAZINE MALEATE 10 MG PO TABS
ORAL_TABLET | ORAL | Status: AC
  Filled 2019-10-25: qty 1

## 2019-10-25 NOTE — Patient Instructions (Signed)
[facility] Discharge Instructions for Patients Receiving Chemotherapy  Today you received the following chemotherapy agents Alimta  To help prevent nausea and vomiting after your treatment, we encourage you to take your nausea medication as directed   If you develop nausea and vomiting that is not controlled by your nausea medication, call the clinic.   BELOW ARE SYMPTOMS THAT SHOULD BE REPORTED IMMEDIATELY:  *FEVER GREATER THAN 100.5 F  *CHILLS WITH OR WITHOUT FEVER  NAUSEA AND VOMITING THAT IS NOT CONTROLLED WITH YOUR NAUSEA MEDICATION  *UNUSUAL SHORTNESS OF BREATH  *UNUSUAL BRUISING OR BLEEDING  TENDERNESS IN MOUTH AND THROAT WITH OR WITHOUT PRESENCE OF ULCERS  *URINARY PROBLEMS  *BOWEL PROBLEMS  UNUSUAL RASH Items with * indicate a potential emergency and should be followed up as soon as possible.  Feel free to call the clinic should you have any questions or concerns. The clinic phone number is [phone].  Please show the [facility] at check-in to the Emergency Department and triage nurse.

## 2019-11-03 ENCOUNTER — Ambulatory Visit: Payer: Medicare Other

## 2019-11-09 NOTE — Progress Notes (Signed)
Pharmacist Chemotherapy Monitoring - Follow Up Assessment    I verify that I have reviewed each item in the below checklist:  . Regimen for the patient is scheduled for the appropriate day and plan matches scheduled date. Marland Kitchen Appropriate non-routine labs are ordered dependent on drug ordered. . If applicable, additional medications reviewed and ordered per protocol based on lifetime cumulative doses and/or treatment regimen.   Plan for follow-up and/or issues identified: No . I-vent associated with next due treatment: No . MD and/or nursing notified: No  Melodie Ashworth K 11/09/2019 12:57 PM

## 2019-11-12 ENCOUNTER — Other Ambulatory Visit: Payer: Self-pay | Admitting: Medical Oncology

## 2019-11-12 DIAGNOSIS — C3491 Malignant neoplasm of unspecified part of right bronchus or lung: Secondary | ICD-10-CM

## 2019-11-12 DIAGNOSIS — Z5111 Encounter for antineoplastic chemotherapy: Secondary | ICD-10-CM

## 2019-11-12 DIAGNOSIS — C3490 Malignant neoplasm of unspecified part of unspecified bronchus or lung: Secondary | ICD-10-CM

## 2019-11-12 MED ORDER — FOLIC ACID 1 MG PO TABS: 1.0000 mg | ORAL_TABLET | Freq: Every day | ORAL | 0 refills | Status: DC

## 2019-11-14 NOTE — Progress Notes (Signed)
[place], MD [address] [place] [address]  DIAGNOSIS: Stage IV (T1b, N2, M1b) non-small cell lung cancer, adenocarcinoma diagnosed in March 2017 and presented with right lower lobe lung nodule in addition to mediastinal lymphadenopathy and metastatic bone lesions.  Molecular studies:PDL 1 TPS <1%.   [employer] One Studies: Positive for ERBB2 [id number]_[id number]. Negative for EGFR, KRAS, ALK, BRAF, MET, RET and ROS1.  PRIOR THERAPY: 1) Induction systemic chemotherapy with carboplatin for AUC of 5 and Alimta 500 MG/M2 every 3 weeks is status post 6 cycles at the [facility], last dose was given 03/05/2016 with stable disease. 2) palliative radiation to the metastatic bone disease in the lower back and pelvic area  CURRENT THERAPY: 1) Maintenance systemic chemotherapy with Alimta 500 MG/M2 every 3 weeks status post63cycles. First dose was given 03/25/2016.  2) Zometa 4 mg IV every 12 week for metastatic bone disease.  INTERVAL HISTORY: Valerie Long 74 y.o. female returns to the clinic for a follow up visit. The patient is feeling well today without any concerning complaints except for a dry patch of skin on her back and under her arm. She denies any new soaps, lotions, or detergents. She states she put a alcohol wipe on the area and now the area is dry. She denies any pain, swelling, drainage, or fluctuance . The patient continues to tolerate treatment withsystemic chemotherapy with Alimtawell without any adverse sideeffectsexcept facial flushing from her steroid pre-medication. Denies anyfevers, chills,weight loss, or night sweats. Denies any hemoptysisor chest pain.She reports her baseline dyspnea on exertion.She has had a cough since the fall of 2020. No acute etiology has been noted on any of her restaging CT scans. She states it is a little better because she started drinking tea before bed which  has been helping. She also has tessalon prescribed as well. The patient does not think this is related to reflux or post nasal drainage, when suggested. Denies any vomiting, diarrhea, or constipationbut reports very mild nauseaoccasionally following treatment. Denies nausea following her most recent cycle of treatment.Denies any headache or visual changes.She is here today for evaluationbefore starting cycle #64   MEDICAL HISTORY: Past Medical History:  Diagnosis Date  . Adenocarcinoma of right lung, stage 4 ([place]) 2017  . Anemia   . Arthritis   . Bone metastases ([place]) 02/24/2017  . Encounter for antineoplastic chemotherapy 03/17/2016  . Hypercholesterolemia   . Hypertension 06/18/2016  . Osteopenia   . 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 Pneumonia   . Shortness of breath dyspnea     ALLERGIES:  has No Known Allergies.  MEDICATIONS:  Current Outpatient Medications  Medication Sig Dispense Refill  . acetaminophen (TYLENOL) 500 MG tablet Take 1,000 mg by mouth every 6 (six) hours as needed for moderate pain or headache.    . benzonatate (TESSALON) 200 MG capsule TAKE 1 CAPSULE BY MOUTH EVERY DAY 3 TIMES A DAY AS NEEDED FOR COUGH 40 capsule 0  . Calcium Carb-Cholecalciferol (CALCIUM 600/VITAMIN D3 PO) Take by mouth.    . Cyanocobalamin (B-12 PO) Take 2 tablets by mouth daily.    Marland Kitchen dexamethasone (DECADRON) 4 MG tablet Take 90m by mouth twice daily the day before, of, and after chemo 40 tablet 1  . Fish Oil-Cholecalciferol (OMEGA-3 + VITAMIN D3 PO) Take 1 tablet by mouth daily.    . folic acid (FOLVITE) 1 MG tablet Take 1  tablet (1 mg total) by mouth daily. 90 tablet 0  . lidocaine-prilocaine (EMLA) cream Apply 1 application topically as needed. Apply 1-2 tsp over port site 1-2 hours prior to chemo. 30 g 0  . meloxicam (MOBIC) 15 MG tablet TAKE 1 TABLET BY MOUTH EVERY DAY AS NEEDED FOR PAIN (Patient not taking: Reported on 10/16/2019) 30 tablet 0  . ondansetron (ZOFRAN)  8 MG tablet Take 8 mg by mouth every 8 (eight) hours as needed.    Marland Kitchen OVER THE COUNTER MEDICATION Apply 1 drop topically daily as needed. 1 drop CBD oil in vicks vapor rub . Applied to right thigh for pain relief.    Marland Kitchen PRESCRIPTION MEDICATION Inject 1 Dose as directed every 21 ( twenty-one) days. Chemo     No current facility-administered medications for this visit.    SURGICAL HISTORY:  Past Surgical History:  Procedure Laterality Date  . CESAREAN SECTION     myomectomy  . COLONOSCOPY W/ POLYPECTOMY    . IR GENERIC HISTORICAL  08/17/2016   IR US GUIDE VASC ACCESS RIGHT 08/17/2016 [name], MD [facility]  . IR GENERIC HISTORICAL  08/17/2016   IR FLUORO GUIDE PORT INSERTION RIGHT 08/17/2016 [name], MD [facility]  . MEDIASTINOSCOPY N/A 09/25/2015   Procedure: MEDIASTINOSCOPY;  Surgeon: Melrose Nakayama, MD;  Location: [place];  Service: Thoracic;  Laterality: N/A;  . VIDEO BRONCHOSCOPY Bilateral 08/19/2015   Procedure: VIDEO BRONCHOSCOPY WITH FLUORO;  Surgeon: Rigoberto Noel, MD;  Location: [place];  Service: Cardiopulmonary;  Laterality: Bilateral;  . VIDEO BRONCHOSCOPY N/A 09/08/2015   Procedure: VIDEO BRONCHOSCOPY WITH FLUORO;  Surgeon: Rigoberto Noel, MD;  Location: [place];  Service: Thoracic;  Laterality: N/A;  . VIDEO BRONCHOSCOPY WITH ENDOBRONCHIAL ULTRASOUND Right 09/08/2015   Procedure: ATTEMPTED VIDEO BRONCHOSCOPY ENDOBRONCHIAL ULTRASOUND  ;  Surgeon: Rigoberto Noel, MD;  Location: [place];  Service: Thoracic;  Laterality: Right;    REVIEW OF SYSTEMS:   Constitutional:Negative for appetite change,fatigue,chills, fever and unexpected weight change.  HENT: Negative for mouth sores, nosebleeds, sore throat and trouble swallowing.  Eyes: Negative for eye problems and icterus.  Respiratory:Positive for chronic cough.Positive for baseline mild dyspnea on exertion. Negative for hemoptysis and wheezing.  Cardiovascular:Positive for stable bilateral lower  extremityswelling.Negative for chest pain. Gastrointestinal:Positive for mild nausea(none recently).Negative for constipation, diarrhea, and vomiting.  Genitourinary: Negative for bladder incontinence, difficulty urinating, dysuria, frequency and hematuria.  Musculoskeletal:Positive for chronic low back pain.Negative for gait problem, neck pain and neck stiffness.  Skin: Positive for dry skin and skin discoloration. Neurological: Negative for dizziness, extremity weakness, gait problem, headaches, light-headedness and seizures.Ambulates with a cane. Hematological: Negative for adenopathy. Does not bruise/bleed easily.  Psychiatric/Behavioral: Negative for confusion, depression and sleep disturbance. The patient is not nervous/anxious.     PHYSICAL EXAMINATION:  Last menstrual period 06/09/1998.  ECOG PERFORMANCE STATUS: 1 - Symptomatic but completely ambulatory  Physical Exam  Constitutional: Oriented to person, place, and time and well-developed, well-nourished, and in no distress.  HENT:  Head: Normocephalic and atraumatic.  Mouth/Throat: Oropharynx is clear and moist. No oropharyngeal exudate.  Eyes: Conjunctivae are normal. Right eye exhibits no discharge. Left eye exhibits no discharge. No scleral icterus.  Neck: Normal range of motion. Neck supple.  Cardiovascular: Normal rate, regular rhythm, normal heart sounds and intact distal pulses.  Pulmonary/Chest: Effort normal and breath sounds normal. No respiratory distress. No wheezes. No rales.  Abdominal: Soft. Bowel sounds are normal. Exhibits no distension and no mass. There  is no tenderness.  Musculoskeletal: Positive for bilateral lower extremity edema (stable).Normal range of motion.  Lymphadenopathy:  No cervical adenopathy.  Neurological: Alert and oriented to person, place, and time. Exhibits normal muscle tone. Gait normal. Coordination normal.  Skin: Two very small dry patches of skin on right shoulder and  under left arm near axillary region. Skin is warm and dry. No rash noted. Not diaphoretic. No erythema. No pallor.  Psychiatric: Mood, memory and judgment normal.  Vitals reviewed.  LABORATORY DATA: Lab Results  Component Value Date   WBC 6.6 10/25/2019   HGB 10.0 (L) 10/25/2019   HCT 30.4 (L) 10/25/2019   MCV 101.7 (H) 10/25/2019   PLT 286 10/25/2019      Chemistry      Component Value Date/Time   NA 138 10/25/2019 1008   NA 137 06/09/2017 0901   K 3.8 10/25/2019 1008   K 3.9 06/09/2017 0901   CL 105 10/25/2019 1008   CO2 25 10/25/2019 1008   CO2 25 06/09/2017 0901   BUN 17 10/25/2019 1008   BUN 19.0 06/09/2017 0901   CREATININE 1.00 10/25/2019 1008   CREATININE 1.2 (H) 06/09/2017 0901   GLU 170 01/23/2016 0000      Component Value Date/Time   CALCIUM 9.5 10/25/2019 1008   CALCIUM 9.9 06/09/2017 0901   ALKPHOS 67 10/25/2019 1008   ALKPHOS 63 06/09/2017 0901   AST 28 10/25/2019 1008   AST 35 (H) 06/09/2017 0901   ALT 13 10/25/2019 1008   ALT 23 06/09/2017 0901   BILITOT 0.4 10/25/2019 1008   BILITOT 0.65 06/09/2017 0901       RADIOGRAPHIC STUDIES:  No results found.   ASSESSMENT/PLAN:  This is a very pleasant 74-year-old Hispanic female diagnosed with metastatic non-small cell lung cancer, adenocarcinoma. She presented with a right lower lobe lung nodule in addition to mediastinal lymphadenopathy and metastatic bone lesions. She was diagnosed in March 2017. Her PDL 1 expression is <1% and she has no actionable mutations.  She previously underwent inductiontreatmentwith systemic chemotherapy withCarboplatin and Alimta. She is currently undergoing maintenance therapy withAlimta. She is status post63cycles. The patient has been tolerating this treatment fairly well without any concerning adversesideeffects.  Labs were reviewed. Recommend that she proceed with cycle #64 today as scheduled.   I will arrange for a restaging CT scan prior to her next  cycle of treatment.   We will see her back for a follow up visit in 3 weeks for evaluation and to review her scan results before starting cycle #65.  For the dry patches of skin, the patient was advised to use lotion.   I have sent a refill of decadron the patient's pharmacy as requested.   The patient was advised to call immediately if she has any concerning symptoms in the interval. The patient voices understanding of current disease status and treatment options and is in agreement with the current care plan. All questions were answered. The patient knows to call the clinic with any problems, questions or concerns. We can certainly see the patient much sooner if necessary  No orders of the defined types were placed in this encounter.    Cassandra L Heilingoetter, PA-C 11/14/19  

## 2019-11-15 ENCOUNTER — Inpatient Hospital Stay: Payer: Medicare Other | Attending: Internal Medicine | Admitting: Physician Assistant

## 2019-11-15 ENCOUNTER — Other Ambulatory Visit: Payer: Self-pay

## 2019-11-15 ENCOUNTER — Inpatient Hospital Stay: Payer: Medicare Other

## 2019-11-15 VITALS — BP 154/76 | HR 66 | Temp 97.9°F | Resp 20 | Ht 63.0 in | Wt 149.8 lb

## 2019-11-15 DIAGNOSIS — Z5111 Encounter for antineoplastic chemotherapy: Secondary | ICD-10-CM | POA: Diagnosis not present

## 2019-11-15 DIAGNOSIS — Z79899 Other long term (current) drug therapy: Secondary | ICD-10-CM | POA: Diagnosis not present

## 2019-11-15 DIAGNOSIS — C3431 Malignant neoplasm of lower lobe, right bronchus or lung: Secondary | ICD-10-CM | POA: Insufficient documentation

## 2019-11-15 DIAGNOSIS — Z7952 Long term (current) use of systemic steroids: Secondary | ICD-10-CM | POA: Insufficient documentation

## 2019-11-15 DIAGNOSIS — C7951 Secondary malignant neoplasm of bone: Secondary | ICD-10-CM

## 2019-11-15 DIAGNOSIS — C3491 Malignant neoplasm of unspecified part of right bronchus or lung: Secondary | ICD-10-CM

## 2019-11-15 DIAGNOSIS — Z791 Long term (current) use of non-steroidal anti-inflammatories (NSAID): Secondary | ICD-10-CM | POA: Diagnosis not present

## 2019-11-15 DIAGNOSIS — I10 Essential (primary) hypertension: Secondary | ICD-10-CM | POA: Insufficient documentation

## 2019-11-15 DIAGNOSIS — E7800 Pure hypercholesterolemia, unspecified: Secondary | ICD-10-CM | POA: Diagnosis not present

## 2019-11-15 DIAGNOSIS — M8580 Other specified disorders of bone density and structure, unspecified site: Secondary | ICD-10-CM | POA: Insufficient documentation

## 2019-11-15 DIAGNOSIS — Z95828 Presence of other vascular implants and grafts: Secondary | ICD-10-CM

## 2019-11-15 LAB — CMP (CANCER CENTER ONLY)
ALT: 11 U/L (ref 0–44)
AST: 26 U/L (ref 15–41)
Albumin: 3.2 g/dL — ABNORMAL LOW (ref 3.5–5.0)
Alkaline Phosphatase: 69 U/L (ref 38–126)
Anion gap: 8 (ref 5–15)
BUN: 14 mg/dL (ref 8–23)
CO2: 23 mmol/L (ref 22–32)
Calcium: 9.3 mg/dL (ref 8.9–10.3)
Chloride: 105 mmol/L (ref 98–111)
Creatinine: 0.97 mg/dL (ref 0.44–1.00)
GFR, Est AFR Am: >60 mL/min (ref >60)
GFR, Estimated: 58 mL/min — ABNORMAL LOW (ref >60)
Glucose, Bld: 98 mg/dL (ref 70–99)
Potassium: 4.2 mmol/L (ref 3.5–5.1)
Sodium: 136 mmol/L (ref 135–145)
Total Bilirubin: 0.3 mg/dL (ref 0.3–1.2)
Total Protein: 7.1 g/dL (ref 6.5–8.1)

## 2019-11-15 LAB — CBC WITH DIFFERENTIAL (CANCER CENTER ONLY)
Abs Immature Granulocytes: 0.02 10*3/uL (ref 0.00–0.07)
Basophils Absolute: 0 10*3/uL (ref 0.0–0.1)
Basophils Relative: 0 %
Eosinophils Absolute: 0 10*3/uL (ref 0.0–0.5)
Eosinophils Relative: 0 %
HCT: 31.7 % — ABNORMAL LOW (ref 36.0–46.0)
Hemoglobin: 10.3 g/dL — ABNORMAL LOW (ref 12.0–15.0)
Immature Granulocytes: 0 %
Lymphocytes Relative: 9 %
Lymphs Abs: 0.6 10*3/uL — ABNORMAL LOW (ref 0.7–4.0)
MCH: 33.7 pg (ref 26.0–34.0)
MCHC: 32.5 g/dL (ref 30.0–36.0)
MCV: 103.6 fL — ABNORMAL HIGH (ref 80.0–100.0)
Monocytes Absolute: 0.6 10*3/uL (ref 0.1–1.0)
Monocytes Relative: 8 %
Neutro Abs: 5.9 10*3/uL (ref 1.7–7.7)
Neutrophils Relative %: 83 %
Platelet Count: 325 10*3/uL (ref 150–400)
RBC: 3.06 MIL/uL — ABNORMAL LOW (ref 3.87–5.11)
RDW: 14.8 % (ref 11.5–15.5)
WBC Count: 7.1 10*3/uL (ref 4.0–10.5)
nRBC: 0 % (ref 0.0–0.2)

## 2019-11-15 MED ORDER — CYANOCOBALAMIN 1000 MCG/ML IJ SOLN
INTRAMUSCULAR | Status: AC
  Filled 2019-11-15: qty 1

## 2019-11-15 MED ORDER — SODIUM CHLORIDE 0.9% FLUSH
10.0000 mL | Freq: Once | INTRAVENOUS | Status: AC
  Administered 2019-11-15: 10 mL via INTRAVENOUS
  Filled 2019-11-15: qty 10

## 2019-11-15 MED ORDER — SODIUM CHLORIDE 0.9 % IV SOLN
500.0000 mg/m2 | Freq: Once | INTRAVENOUS | Status: AC
  Administered 2019-11-15: 900 mg via INTRAVENOUS
  Filled 2019-11-15: qty 20

## 2019-11-15 MED ORDER — PROCHLORPERAZINE MALEATE 10 MG PO TABS
ORAL_TABLET | ORAL | Status: AC
  Filled 2019-11-15: qty 1

## 2019-11-15 MED ORDER — PROCHLORPERAZINE MALEATE 10 MG PO TABS
10.0000 mg | ORAL_TABLET | Freq: Once | ORAL | Status: AC
  Administered 2019-11-15: 10 mg via ORAL

## 2019-11-15 MED ORDER — DEXAMETHASONE 4 MG PO TABS: ORAL_TABLET | ORAL | 1 refills | Status: DC

## 2019-11-15 MED ORDER — HEPARIN SOD (PORK) LOCK FLUSH 100 UNIT/ML IV SOLN
500.0000 [IU] | Freq: Once | INTRAVENOUS | Status: AC | PRN
  Administered 2019-11-15: 500 [IU]
  Filled 2019-11-15: qty 5

## 2019-11-15 MED ORDER — SODIUM CHLORIDE 0.9% FLUSH
10.0000 mL | INTRAVENOUS | Status: DC | PRN
  Administered 2019-11-15: 10 mL
  Filled 2019-11-15: qty 10

## 2019-11-15 MED ORDER — SODIUM CHLORIDE 0.9 % IV SOLN
Freq: Once | INTRAVENOUS | Status: AC
  Filled 2019-11-15: qty 250

## 2019-11-15 MED ORDER — CYANOCOBALAMIN 1000 MCG/ML IJ SOLN
1000.0000 ug | Freq: Once | INTRAMUSCULAR | Status: AC
  Administered 2019-11-15: 1000 ug via INTRAMUSCULAR

## 2019-11-15 NOTE — Patient Instructions (Signed)
[facility] Discharge Instructions for Patients Receiving Chemotherapy  Today you received the following chemotherapy agents: Pemetrexed (Alimta)  To help prevent nausea and vomiting after your treatment, we encourage you to take your nausea medication as directed by your provider.    If you develop nausea and vomiting that is not controlled by your nausea medication, call the clinic.   BELOW ARE SYMPTOMS THAT SHOULD BE REPORTED IMMEDIATELY:  *FEVER GREATER THAN 100.5 F  *CHILLS WITH OR WITHOUT FEVER  NAUSEA AND VOMITING THAT IS NOT CONTROLLED WITH YOUR NAUSEA MEDICATION  *UNUSUAL SHORTNESS OF BREATH  *UNUSUAL BRUISING OR BLEEDING  TENDERNESS IN MOUTH AND THROAT WITH OR WITHOUT PRESENCE OF ULCERS  *URINARY PROBLEMS  *BOWEL PROBLEMS  UNUSUAL RASH Items with * indicate a potential emergency and should be followed up as soon as possible.  Feel free to call the clinic should you have any questions or concerns. The clinic phone number is [phone].  Please show the [facility] at check-in to the Emergency Department and triage nurse.

## 2019-11-23 ENCOUNTER — Other Ambulatory Visit: Payer: Self-pay

## 2019-11-23 ENCOUNTER — Ambulatory Visit (INDEPENDENT_AMBULATORY_CARE_PROVIDER_SITE_OTHER): Payer: Medicare Other | Admitting: Medical

## 2019-11-23 ENCOUNTER — Ambulatory Visit (HOSPITAL_BASED_OUTPATIENT_CLINIC_OR_DEPARTMENT_OTHER)
Admission: RE | Admit: 2019-11-23 | Discharge: 2019-11-23 | Disposition: A | Discharge status: Home / Self Care | Payer: Medicare Other | Source: Ambulatory Visit | Attending: Medical | Admitting: Medical

## 2019-11-23 VITALS — BP 127/77 | HR 113 | Temp 98.2°F | Resp 20 | Ht 63.0 in | Wt 143.6 lb

## 2019-11-23 DIAGNOSIS — L853 Xerosis cutis: Secondary | ICD-10-CM

## 2019-11-23 DIAGNOSIS — R05 Cough: Secondary | ICD-10-CM

## 2019-11-23 DIAGNOSIS — R059 Cough, unspecified: Secondary | ICD-10-CM

## 2019-11-23 DIAGNOSIS — T7840XA Allergy, unspecified, initial encounter: Secondary | ICD-10-CM | POA: Diagnosis not present

## 2019-11-23 DIAGNOSIS — J40 Bronchitis, not specified as acute or chronic: Secondary | ICD-10-CM

## 2019-11-23 DIAGNOSIS — R0981 Nasal congestion: Secondary | ICD-10-CM | POA: Diagnosis not present

## 2019-11-23 MED ORDER — TRIAMCINOLONE ACETONIDE 0.025 % EX OINT
1.0000 | TOPICAL_OINTMENT | Freq: Three times a day (TID) | CUTANEOUS | 0 refills | Status: DC
Start: 2019-11-23 — End: 2020-05-08

## 2019-11-23 MED ORDER — LEVOCETIRIZINE DIHYDROCHLORIDE 5 MG PO TABS
5.0000 mg | ORAL_TABLET | Freq: Every evening | ORAL | 3 refills | Status: DC
Start: 2019-11-23 — End: 2020-02-13

## 2019-11-23 MED ORDER — HYDROCODONE-HOMATROPINE 5-1.5 MG/5ML PO SYRP: 5.0000 mL | ORAL_SOLUTION | Freq: Four times a day (QID) | ORAL | 0 refills | Status: DC | PRN

## 2019-11-23 MED ORDER — AZITHROMYCIN 250 MG PO TABS
ORAL_TABLET | ORAL | 0 refills | Status: DC
Start: 2019-11-23 — End: 2019-12-03

## 2019-11-23 NOTE — Patient Instructions (Addendum)
Your recent mild nasal congestion may represent allergic rhinitis. Offered Flonase nasal spray but declined. Will make Xyzal antihistamine available to use at night.  Recent skin rash and itching indicates probable allergic reaction. On review no cause found. Skin also does feel dry. Recommend that you continue current moisturizer twice daily and use Kenalog ointment 3 times daily. Discussion on potential benefit of very low dose Medrol dose pack versus risk. Decided not to write Medrol due to patient's concerns.  For moderate to severe cough keeping you up at night, I prescribed Hycodan cough syrup. Rx advisement given.  For recent productive cough and history of chemo for lung cancer, want to get chest x-ray today. Decided to go ahead and prescribe a azithromycin antibiotic since you report subjective fever.  Follow-up in 7 days or as needed.

## 2019-11-23 NOTE — Progress Notes (Signed)
Subjective:    Patient ID: Valerie Long, female    DOB: February 16, 1946, 74 y.o.   MRN: [id number]  HPI   Pt in for recent rash on skin rash. She had rash on left upper chest, under axillary area and upper back. Pt recent rash area for about 6 days. Pt has use cetafil but still itches.No change in laundry detergent.  Pt states has occasional cough on and off for one year after her flu vaccine.  But worse recently with recent nasal congestion and pnd. Pt states last 2 days subjective fever. Faint chest congestion.   Pt has hx of lung cancer. Pt had recent chemo this past Thursday. Pt has been treated for lung cancer for 4 years. Next chemo 21 days from last tx.  Pt has had covid vaccine. Finished series.    Review of Systems  Constitutional: Positive for fever. Negative for chills and fatigue.  HENT: Positive for congestion and postnasal drip. Negative for sinus pressure and sinus pain.   Respiratory: Positive for cough. Negative for shortness of breath and wheezing.   Cardiovascular: Negative for chest pain and palpitations.  Gastrointestinal: Negative for abdominal pain.  Musculoskeletal: Negative for back pain.  Skin: Positive for rash.  Neurological: Negative for dizziness and headaches.  Hematological: Negative for adenopathy.  Psychiatric/Behavioral: Negative for behavioral problems and confusion.   Past Medical History:  Diagnosis Date  . Adenocarcinoma of right lung, stage 4 ([place]) 2017  . Anemia   . Arthritis   . Bone metastases ([place]) 02/24/2017  . Encounter for antineoplastic chemotherapy 03/17/2016  . Hypercholesterolemia   . Hypertension 06/18/2016  . Osteopenia   . 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 Pneumonia   . Shortness of breath dyspnea      Social History   Socioeconomic History  . Marital status: Legally Separated    Spouse name: Not on file  . Number of children: 1  . Years of education: Not on file  . Highest education level: Not on  file  Occupational History  . Occupation: retired     Fish farm manager: SELF EMPLOYED  Tobacco Use  . Smoking status: Never Smoker  . Smokeless tobacco: Never Used  Vaping Use  . Vaping Use: Never used  Substance and Sexual Activity  . Alcohol use: Yes    Alcohol/week: 0.0 standard drinks    Comment: SOCIAL  . Drug use: No  . Sexual activity: Never  Other Topics Concern  . Not on file  Social History Narrative   From [place], moved from [place]  to [place] 2008   Lives w/ her mother       Right handed      Highest level of edu- 7th grade      Retired from [employer] Strain:   . Difficulty of Paying Living Expenses:   Food Insecurity:   . Worried About Charity fundraiser in the Last Year:   . Arboriculturist in the Last Year:   Transportation Needs:   . Film/video editor (Medical):   Marland Kitchen Lack of Transportation (Non-Medical):   Physical Activity:   . Days of Exercise per Week:   . Minutes of Exercise per Session:   Stress:   . Feeling of Stress :   Social Connections:   . Frequency of Communication with Friends and Family:   . Frequency of Social Gatherings with Friends  and Family:   . Attends Religious Services:   . Active Member of Clubs or Organizations:   . Attends Archivist Meetings:   Marland Kitchen Marital Status:   Intimate Partner Violence:   . Fear of Current or Ex-Partner:   . Emotionally Abused:   Marland Kitchen Physically Abused:   . Sexually Abused:     Past Surgical History:  Procedure Laterality Date  . CESAREAN SECTION     myomectomy  . COLONOSCOPY W/ POLYPECTOMY    . IR GENERIC HISTORICAL  08/17/2016   IR US GUIDE VASC ACCESS RIGHT 08/17/2016 [name], MD [facility]  . IR GENERIC HISTORICAL  08/17/2016   IR FLUORO GUIDE PORT INSERTION RIGHT 08/17/2016 [name], MD [facility]  . MEDIASTINOSCOPY N/A 09/25/2015   Procedure: MEDIASTINOSCOPY;  Surgeon: Melrose Nakayama, MD;   Location: [facility];  Service: Thoracic;  Laterality: N/A;  . VIDEO BRONCHOSCOPY Bilateral 08/19/2015   Procedure: VIDEO BRONCHOSCOPY WITH FLUORO;  Surgeon: Rigoberto Noel, MD;  Location: [facility];  Service: Cardiopulmonary;  Laterality: Bilateral;  . VIDEO BRONCHOSCOPY N/A 09/08/2015   Procedure: VIDEO BRONCHOSCOPY WITH FLUORO;  Surgeon: Rigoberto Noel, MD;  Location: [facility] OR;  Service: Thoracic;  Laterality: N/A;  . VIDEO BRONCHOSCOPY WITH ENDOBRONCHIAL ULTRASOUND Right 09/08/2015   Procedure: ATTEMPTED VIDEO BRONCHOSCOPY ENDOBRONCHIAL ULTRASOUND  ;  Surgeon: Rigoberto Noel, MD;  Location: [place];  Service: Thoracic;  Laterality: Right;    Family History  Problem Relation Age of Onset  . Hypertension Mother        F and M  . CAD Father   . Diabetes Other        auncle-aunts   . Cancer Other        UTERINE   . Breast cancer Sister   . Stroke Neg Hx   . Colon cancer Neg Hx     No Known Allergies  Current Outpatient Medications on File Prior to Visit  Medication Sig Dispense Refill  . acetaminophen (TYLENOL) 500 MG tablet Take 1,000 mg by mouth every 6 (six) hours as needed for moderate pain or headache.    . Calcium Carb-Cholecalciferol (CALCIUM 600/VITAMIN D3 PO) Take by mouth.    . Cyanocobalamin (B-12 PO) Take 2 tablets by mouth daily.    Marland Kitchen dexamethasone (DECADRON) 4 MG tablet Take 4mg  by mouth twice daily the day before, of, and after chemo 40 tablet 1  . Fish Oil-Cholecalciferol (OMEGA-3 + VITAMIN D3 PO) Take 1 tablet by mouth daily.    . folic acid (FOLVITE) 1 MG tablet Take 1 tablet (1 mg total) by mouth daily. 90 tablet 0  . lidocaine-prilocaine (EMLA) cream Apply 1 application topically as needed. Apply 1-2 tsp over port site 1-2 hours prior to chemo. 30 g 0  . ondansetron (ZOFRAN) 8 MG tablet Take 8 mg by mouth every 8 (eight) hours as needed.    Marland Kitchen OVER THE COUNTER MEDICATION Apply 1 drop topically daily as needed. 1 drop CBD oil in vicks vapor rub . Applied to right thigh for pain  relief.    Marland Kitchen PRESCRIPTION MEDICATION Inject 1 Dose as directed every 21 ( twenty-one) days. Chemo    . meloxicam (MOBIC) 15 MG tablet TAKE 1 TABLET BY MOUTH EVERY DAY AS NEEDED FOR PAIN (Patient not taking: Reported on 10/16/2019) 30 tablet 0   Current Facility-Administered Medications on File Prior to Visit  Medication Dose Route Frequency Provider Last Rate Last Admin  . sodium chloride flush (NS) 0.9 %  injection 10 mL  10 mL Intracatheter PRN Curt Bears, MD   10 mL at 11/15/19 1400    BP 127/77 (BP Location: Right Arm, Patient Position: Sitting, Cuff Size: Normal)   Pulse (!) 113   Temp 98.2 F (36.8 C) (Temporal)   Resp 20   Ht 5\' 3"  (1.6 m)   Wt 143 lb 9.6 oz (65.1 kg)   LMP 06/09/1998   SpO2 100%   BMI 25.44 kg/m       Objective:   Physical Exam   General  Mental Status - Alert. General Appearance - Well groomed. Not in acute distress.  Skin Rashes- scattered mild hyperpigmented rash rt elbow crease. Left upper pec, rt upper pec and upper rt trapezius. All area only about 2.0 cm in diameter. Not raised. No vesicles. Skin feels dry.  HEENT Head- Normal. Ear Auditory Canal - Left- Normal. Right - Normal.Tympanic Membrane- Left- Normal. Right- Normal. Eye Sclera/Conjunctiva- Left- Normal. Right- Normal. Nose & Sinuses Nasal Mucosa- Left-  Boggy and Congested. Right-  Boggy and  Congested.Bilateral no  maxillary and no  frontal sinus pressure. Mouth & Throat Lips: Upper Lip- Normal: no dryness, cracking, pallor, cyanosis, or vesicular eruption. Lower Lip-Normal: no dryness, cracking, pallor, cyanosis or vesicular eruption. Buccal Mucosa- Bilateral- No Aphthous ulcers. Oropharynx- No Discharge or Erythema. +pnd Tonsils: Characteristics- Bilateral- No Erythema or Congestion. Size/Enlargement- Bilateral- No enlargement. Discharge- bilateral-None.  Neck Neck- Supple. No Masses.   Chest and Lung Exam Auscultation: Breath Sounds:-Clear even and  unlabored.  Cardiovascular Auscultation:Rythm- Regular, rate and rhythm. Murmurs & Other Heart Sounds:Ausculatation of the heart reveal- No Murmurs.  Lymphatic Head & Neck General Head & Neck Lymphatics: Bilateral: Description- No Localized lymphadenopathy.      Assessment & Plan:  Your recent mild nasal congestion may represent allergic rhinitis. Offered Flonase nasal spray but declined. Will make Xyzal antihistamine available to use at night.  Recent skin rash and itching indicates probable allergic reaction. On review no cause found. Skin also does feel dry. Recommend that you continue current moisturizer twice daily and use Kenalog ointment 3 times daily. Discussion on potential benefit of very low dose Medrol dose pack versus risk. Decided not to write Medrol due to patient's concerns.  For moderate to severe cough keeping you up at night, I prescribed Hycodan cough syrup. Rx advisement given.  For recent productive cough and history of chemo for lung cancer, want to get chest x-ray today. Decided to go ahead and prescribe a azithromycin antibiotic since you report subjective fever.  Follow-up in 7 days or as needed.  Mackie Pai, PA-C   Time spent with patient today was 30  minutes which consisted of chart review, discussing diagnoses, work up, treatment , answering questions and documentation.

## 2019-11-28 ENCOUNTER — Other Ambulatory Visit: Payer: Self-pay

## 2019-11-28 ENCOUNTER — Telehealth: Payer: Self-pay | Admitting: Medical

## 2019-11-28 DIAGNOSIS — J13 Pneumonia due to Streptococcus pneumoniae: Secondary | ICD-10-CM

## 2019-11-28 DIAGNOSIS — J189 Pneumonia, unspecified organism: Secondary | ICD-10-CM

## 2019-11-28 NOTE — Telephone Encounter (Signed)
Opened to review 

## 2019-11-30 ENCOUNTER — Ambulatory Visit (HOSPITAL_BASED_OUTPATIENT_CLINIC_OR_DEPARTMENT_OTHER)
Admission: RE | Admit: 2019-11-30 | Discharge: 2019-11-30 | Disposition: A | Discharge status: Home / Self Care | Payer: Medicare Other | Source: Ambulatory Visit | Attending: Medical | Admitting: Medical

## 2019-11-30 ENCOUNTER — Other Ambulatory Visit: Payer: Self-pay

## 2019-11-30 DIAGNOSIS — J13 Pneumonia due to Streptococcus pneumoniae: Secondary | ICD-10-CM | POA: Insufficient documentation

## 2019-12-03 ENCOUNTER — Telehealth: Payer: Self-pay | Admitting: Medical

## 2019-12-03 MED ORDER — AZITHROMYCIN 250 MG PO TABS: ORAL_TABLET | ORAL | 0 refills | Status: DC

## 2019-12-03 MED ORDER — BUDESONIDE-FORMOTEROL FUMARATE 80-4.5 MCG/ACT IN AERO
2.0000 | INHALATION_SPRAY | Freq: Two times a day (BID) | RESPIRATORY_TRACT | 3 refills | Status: DC
Start: 2019-12-03 — End: 2020-05-08

## 2019-12-03 NOTE — Telephone Encounter (Signed)
Rx azithromycin sent to pt pharmacy.

## 2019-12-03 NOTE — Telephone Encounter (Signed)
Also decided to rx symbicort inhaler to help with wheezing.

## 2019-12-04 ENCOUNTER — Other Ambulatory Visit: Payer: Self-pay

## 2019-12-04 ENCOUNTER — Encounter (HOSPITAL_COMMUNITY): Payer: Self-pay

## 2019-12-04 ENCOUNTER — Ambulatory Visit (HOSPITAL_COMMUNITY)
Admission: RE | Admit: 2019-12-04 | Discharge: 2019-12-04 | Disposition: A | Discharge status: Home / Self Care | Payer: Medicare Other | Source: Ambulatory Visit | Attending: Physician Assistant | Admitting: Physician Assistant

## 2019-12-04 DIAGNOSIS — C3491 Malignant neoplasm of unspecified part of right bronchus or lung: Secondary | ICD-10-CM | POA: Insufficient documentation

## 2019-12-04 DIAGNOSIS — M4186 Other forms of scoliosis, lumbar region: Secondary | ICD-10-CM | POA: Diagnosis not present

## 2019-12-04 DIAGNOSIS — R109 Unspecified abdominal pain: Secondary | ICD-10-CM | POA: Diagnosis not present

## 2019-12-04 DIAGNOSIS — M5136 Other intervertebral disc degeneration, lumbar region: Secondary | ICD-10-CM | POA: Diagnosis not present

## 2019-12-04 DIAGNOSIS — C3490 Malignant neoplasm of unspecified part of unspecified bronchus or lung: Secondary | ICD-10-CM | POA: Diagnosis not present

## 2019-12-04 DIAGNOSIS — I700 Atherosclerosis of aorta: Secondary | ICD-10-CM | POA: Diagnosis not present

## 2019-12-04 MED ORDER — IOHEXOL 300 MG/ML  SOLN
100.0000 mL | Freq: Once | INTRAMUSCULAR | Status: AC | PRN
  Administered 2019-12-04: 100 mL via INTRAVENOUS

## 2019-12-04 MED ORDER — SODIUM CHLORIDE (PF) 0.9 % IJ SOLN
INTRAMUSCULAR | Status: AC
  Filled 2019-12-04: qty 50

## 2019-12-04 NOTE — Telephone Encounter (Signed)
Patient advised of new medications

## 2019-12-06 ENCOUNTER — Inpatient Hospital Stay: Payer: Medicare Other

## 2019-12-06 ENCOUNTER — Encounter: Payer: Self-pay | Admitting: Internal Medicine

## 2019-12-06 ENCOUNTER — Inpatient Hospital Stay: Payer: Medicare Other | Attending: Internal Medicine | Admitting: Internal Medicine

## 2019-12-06 ENCOUNTER — Other Ambulatory Visit: Payer: Self-pay

## 2019-12-06 VITALS — BP 154/79 | HR 97 | Temp 97.7°F | Resp 17 | Ht 63.0 in | Wt 152.7 lb

## 2019-12-06 DIAGNOSIS — E7800 Pure hypercholesterolemia, unspecified: Secondary | ICD-10-CM | POA: Insufficient documentation

## 2019-12-06 DIAGNOSIS — Z923 Personal history of irradiation: Secondary | ICD-10-CM | POA: Diagnosis not present

## 2019-12-06 DIAGNOSIS — M549 Dorsalgia, unspecified: Secondary | ICD-10-CM | POA: Insufficient documentation

## 2019-12-06 DIAGNOSIS — C3491 Malignant neoplasm of unspecified part of right bronchus or lung: Secondary | ICD-10-CM | POA: Diagnosis not present

## 2019-12-06 DIAGNOSIS — I10 Essential (primary) hypertension: Secondary | ICD-10-CM | POA: Insufficient documentation

## 2019-12-06 DIAGNOSIS — C3431 Malignant neoplasm of lower lobe, right bronchus or lung: Secondary | ICD-10-CM | POA: Insufficient documentation

## 2019-12-06 DIAGNOSIS — Z5111 Encounter for antineoplastic chemotherapy: Secondary | ICD-10-CM

## 2019-12-06 DIAGNOSIS — Z791 Long term (current) use of non-steroidal anti-inflammatories (NSAID): Secondary | ICD-10-CM | POA: Insufficient documentation

## 2019-12-06 DIAGNOSIS — Z7952 Long term (current) use of systemic steroids: Secondary | ICD-10-CM | POA: Diagnosis not present

## 2019-12-06 DIAGNOSIS — Z95828 Presence of other vascular implants and grafts: Secondary | ICD-10-CM

## 2019-12-06 DIAGNOSIS — Z7951 Long term (current) use of inhaled steroids: Secondary | ICD-10-CM | POA: Insufficient documentation

## 2019-12-06 DIAGNOSIS — J90 Pleural effusion, not elsewhere classified: Secondary | ICD-10-CM | POA: Insufficient documentation

## 2019-12-06 DIAGNOSIS — M8580 Other specified disorders of bone density and structure, unspecified site: Secondary | ICD-10-CM | POA: Insufficient documentation

## 2019-12-06 DIAGNOSIS — Z79899 Other long term (current) drug therapy: Secondary | ICD-10-CM | POA: Insufficient documentation

## 2019-12-06 DIAGNOSIS — C7951 Secondary malignant neoplasm of bone: Secondary | ICD-10-CM | POA: Diagnosis not present

## 2019-12-06 LAB — CBC WITH DIFFERENTIAL (CANCER CENTER ONLY)
Abs Immature Granulocytes: 0.02 10*3/uL (ref 0.00–0.07)
Basophils Absolute: 0 10*3/uL (ref 0.0–0.1)
Basophils Relative: 0 %
Eosinophils Absolute: 0 10*3/uL (ref 0.0–0.5)
Eosinophils Relative: 0 %
HCT: 31.2 % — ABNORMAL LOW (ref 36.0–46.0)
Hemoglobin: 10.1 g/dL — ABNORMAL LOW (ref 12.0–15.0)
Immature Granulocytes: 0 %
Lymphocytes Relative: 14 %
Lymphs Abs: 0.8 10*3/uL (ref 0.7–4.0)
MCH: 32.9 pg (ref 26.0–34.0)
MCHC: 32.4 g/dL (ref 30.0–36.0)
MCV: 101.6 fL — ABNORMAL HIGH (ref 80.0–100.0)
Monocytes Absolute: 0.5 10*3/uL (ref 0.1–1.0)
Monocytes Relative: 9 %
Neutro Abs: 4.2 10*3/uL (ref 1.7–7.7)
Neutrophils Relative %: 77 %
Platelet Count: 335 10*3/uL (ref 150–400)
RBC: 3.07 MIL/uL — ABNORMAL LOW (ref 3.87–5.11)
RDW: 15 % (ref 11.5–15.5)
WBC Count: 5.6 10*3/uL (ref 4.0–10.5)
nRBC: 0 % (ref 0.0–0.2)

## 2019-12-06 LAB — CMP (CANCER CENTER ONLY)
ALT: 13 U/L (ref 0–44)
AST: 29 U/L (ref 15–41)
Albumin: 3 g/dL — ABNORMAL LOW (ref 3.5–5.0)
Alkaline Phosphatase: 70 U/L (ref 38–126)
Anion gap: 11 (ref 5–15)
BUN: 14 mg/dL (ref 8–23)
CO2: 24 mmol/L (ref 22–32)
Calcium: 9.6 mg/dL (ref 8.9–10.3)
Chloride: 102 mmol/L (ref 98–111)
Creatinine: 1.12 mg/dL — ABNORMAL HIGH (ref 0.44–1.00)
GFR, Est AFR Am: 57 mL/min — ABNORMAL LOW (ref >60)
GFR, Estimated: 49 mL/min — ABNORMAL LOW (ref >60)
Glucose, Bld: 171 mg/dL — ABNORMAL HIGH (ref 70–99)
Potassium: 3.8 mmol/L (ref 3.5–5.1)
Sodium: 137 mmol/L (ref 135–145)
Total Bilirubin: 0.3 mg/dL (ref 0.3–1.2)
Total Protein: 6.9 g/dL (ref 6.5–8.1)

## 2019-12-06 MED ORDER — PROCHLORPERAZINE MALEATE 10 MG PO TABS
10.0000 mg | ORAL_TABLET | Freq: Once | ORAL | Status: AC
  Administered 2019-12-06: 10 mg via ORAL

## 2019-12-06 MED ORDER — SODIUM CHLORIDE 0.9 % IV SOLN
500.0000 mg/m2 | Freq: Once | INTRAVENOUS | Status: AC
  Administered 2019-12-06: 900 mg via INTRAVENOUS
  Filled 2019-12-06: qty 20

## 2019-12-06 MED ORDER — HEPARIN SOD (PORK) LOCK FLUSH 100 UNIT/ML IV SOLN
500.0000 [IU] | Freq: Once | INTRAVENOUS | Status: AC | PRN
  Administered 2019-12-06: 500 [IU]
  Filled 2019-12-06: qty 5

## 2019-12-06 MED ORDER — PROCHLORPERAZINE MALEATE 10 MG PO TABS
ORAL_TABLET | ORAL | Status: AC
  Filled 2019-12-06: qty 1

## 2019-12-06 MED ORDER — SODIUM CHLORIDE 0.9 % IV SOLN
Freq: Once | INTRAVENOUS | Status: AC
  Filled 2019-12-06: qty 250

## 2019-12-06 MED ORDER — SODIUM CHLORIDE 0.9% FLUSH
10.0000 mL | INTRAVENOUS | Status: DC | PRN
  Administered 2019-12-06: 10 mL via INTRAVENOUS
  Filled 2019-12-06: qty 10

## 2019-12-06 MED ORDER — SODIUM CHLORIDE 0.9% FLUSH
10.0000 mL | INTRAVENOUS | Status: DC | PRN
  Administered 2019-12-06: 10 mL
  Filled 2019-12-06: qty 10

## 2019-12-06 NOTE — Patient Instructions (Signed)
[facility] Discharge Instructions for Patients Receiving Chemotherapy  Today you received the following chemotherapy agents Pemetrexed (ALIMTA).  To help prevent nausea and vomiting after your treatment, we encourage you to take your nausea medication as prescribed.   If you develop nausea and vomiting that is not controlled by your nausea medication, call the clinic.   BELOW ARE SYMPTOMS THAT SHOULD BE REPORTED IMMEDIATELY:  *FEVER GREATER THAN 100.5 F  *CHILLS WITH OR WITHOUT FEVER  NAUSEA AND VOMITING THAT IS NOT CONTROLLED WITH YOUR NAUSEA MEDICATION  *UNUSUAL SHORTNESS OF BREATH  *UNUSUAL BRUISING OR BLEEDING  TENDERNESS IN MOUTH AND THROAT WITH OR WITHOUT PRESENCE OF ULCERS  *URINARY PROBLEMS  *BOWEL PROBLEMS  UNUSUAL RASH Items with * indicate a potential emergency and should be followed up as soon as possible.  Feel free to call the clinic should you have any questions or concerns. The clinic phone number is [phone].  Please show the [facility] at check-in to the Emergency Department and triage nurse.

## 2019-12-06 NOTE — Progress Notes (Signed)
[place] [phone]   Fax:([phone]) [place], MD [address] [place] [address]  DIAGNOSIS: Stage IV (T1b, N2, M1b) non-small cell lung cancer, adenocarcinoma diagnosed in March 2017 and presented with right lower lobe lung nodule in addition to mediastinal lymphadenopathy and metastatic bone lesions.  Molecular studies: PDL 1 TPS  <1%.   Foundation One Studies: Positive for ERBB2 [id number]. Negative for EGFR, KRAS, ALK, BRAF, MET, RET and ROS1.  PRIOR THERAPY:  1) Induction systemic chemotherapy with carboplatin for AUC of 5 and Alimta 500 MG/M2 every 3 weeks is status post 6 cycles at the [facility], last dose was given 03/05/2016 with stable disease. 2) palliative radiation to the metastatic bone disease in the lower back and pelvic area.  CURRENT THERAPY::  1)  Maintenance systemic chemotherapy with Alimta 500 MG/M2 every 3 weeks status post 64 cycles. First dose was given 03/25/2016.  2) Zometa 4 mg IV every 12 week for metastatic bone disease.  INTERVAL HIST Valerie Long 74 y.o. female returns to the clinic today for follow-up visit.  The patient is feeling fine today with no concerning complaints.  The patient continues to complain of cough as well as shortness of breath with exertion.  She was seen by her primary care physician recently and treated for suspicious pneumonia with 2 courses of antibiotic the last one was the back.  She denied having any chest pain or hemoptysis.  She denied having any recent weight loss or night sweats.  She has no nausea, vomiting, diarrhea or constipation.  She denied having any headache or visual changes.  She continues to tolerate her treatment with maintenance Alimta fairly well.  The patient is here today for evaluation with repeat CT scan of the chest, abdomen pelvis for restaging of her disease.  MEDICAL HISTORY: Past Medical History:   Diagnosis Date  . Adenocarcinoma of right lung, stage 4 ([place]) 2017  . Anemia   . Arthritis   . Bone metastases ([place]) 02/24/2017  . Encounter for antineoplastic chemotherapy 03/17/2016  . Hypercholesterolemia   . Hypertension 06/18/2016  . Osteopenia   . 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 Pneumonia   . Shortness of breath dyspnea     ALLERGIES:  has No Known Allergies.  MEDICATIONS:  Current Outpatient Medications  Medication Sig Dispense Refill  . acetaminophen (TYLENOL) 500 MG tablet Take 1,000 mg by mouth every 6 (six) hours as needed for moderate pain or headache.    Marland Kitchen azithromycin (ZITHROMAX) 250 MG tablet Take 2 tablets by mouth on day 1, followed by 1 tablet by mouth daily for 4 days. 6 tablet 0  . budesonide-formoterol (SYMBICORT) 80-4.5 MCG/ACT inhaler Inhale 2 puffs into the lungs 2 (two) times daily. 1 Inhaler 3  . Calcium Carb-Cholecalciferol (CALCIUM 600/VITAMIN D3 PO) Take by mouth.    . Cyanocobalamin (B-12 PO) Take 2 tablets by mouth daily.    Marland Kitchen dexamethasone (DECADRON) 4 MG tablet Take 64m by mouth twice daily the day before, of, and after chemo 40 tablet 1  . Fish Oil-Cholecalciferol (OMEGA-3 + VITAMIN D3 PO) Take 1 tablet by mouth daily.    . folic acid (FOLVITE) 1 MG tablet Take 1 tablet (1 mg total) by mouth daily. 90 tablet 0  . HYDROcodone-homatropine (HYCODAN) 5-1.5 MG/5ML syrup Take 5 mLs by mouth every 6 (six) hours as needed  for cough. 100 mL 0  . levocetirizine (XYZAL) 5 MG tablet Take 1 tablet (5 mg total) by mouth every evening. 30 tablet 3  . lidocaine-prilocaine (EMLA) cream Apply 1 application topically as needed. Apply 1-2 tsp over port site 1-2 hours prior to chemo. 30 g 0  . meloxicam (MOBIC) 15 MG tablet TAKE 1 TABLET BY MOUTH EVERY DAY AS NEEDED FOR PAIN (Patient not taking: Reported on 10/16/2019) 30 tablet 0  . ondansetron (ZOFRAN) 8 MG tablet Take 8 mg by mouth every 8 (eight) hours as needed.    Marland Kitchen OVER THE COUNTER MEDICATION  Apply 1 drop topically daily as needed. 1 drop CBD oil in vicks vapor rub . Applied to right thigh for pain relief.    Marland Kitchen PRESCRIPTION MEDICATION Inject 1 Dose as directed every 21 ( twenty-one) days. Chemo    . triamcinolone (KENALOG) 0.025 % ointment Apply 1 application topically 3 (three) times daily. 30 g 0   No current facility-administered medications for this visit.   Facility-Administered Medications Ordered in Other Visits  Medication Dose Route Frequency Provider Last Rate Last Admin  . sodium chloride flush (NS) 0.9 % injection 10 mL  10 mL Intracatheter PRN Curt Bears, MD   10 mL at 11/15/19 1400  . sodium chloride flush (NS) 0.9 % injection 10 mL  10 mL Intravenous PRN Curt Bears, MD   10 mL at 12/06/19 0902    SURGICAL HISTORY:  Past Surgical History:  Procedure Laterality Date  . CESAREAN SECTION     myomectomy  . COLONOSCOPY W/ POLYPECTOMY    . IR GENERIC HISTORICAL  08/17/2016   IR US GUIDE VASC ACCESS RIGHT 08/17/2016 [name], MD [facility]INTER[facility]  . IR GENERIC HISTORICAL  08/17/2016   IR FLUORO GUIDE PORT INSERTION RIGHT 08/17/2016 [name], MD [facility]  . MEDIASTINOSCOPY N/A 09/25/2015   Procedure: MEDIASTINOSCOPY;  Surgeon: Melrose Nakayama, MD;  Location: [place];  Service: Thoracic;  Laterality: N/A;  . VIDEO BRONCHOSCOPY Bilateral 08/19/2015   Procedure: VIDEO BRONCHOSCOPY WITH FLUORO;  Surgeon: Rigoberto Noel, MD;  Location: [facility];  Service: Cardiopulmonary;  Laterality: Bilateral;  . VIDEO BRONCHOSCOPY N/A 09/08/2015   Procedure: VIDEO BRONCHOSCOPY WITH FLUORO;  Surgeon: Rigoberto Noel, MD;  Location: [facility];  Service: Thoracic;  Laterality: N/A;  . VIDEO BRONCHOSCOPY WITH ENDOBRONCHIAL ULTRASOUND Right 09/08/2015   Procedure: ATTEMPTED VIDEO BRONCHOSCOPY ENDOBRONCHIAL ULTRASOUND  ;  Surgeon: Rigoberto Noel, MD;  Location: [facility];  Service: Thoracic;  Laterality: Right;    REVIEW OF SYSTEMS:  Constitutional: positive for fatigue Eyes:  negative Ears, nose, mouth, throat, and face: negative Respiratory: positive for cough and dyspnea on exertion Cardiovascular: negative Gastrointestinal: negative Genitourinary:negative Integument/breast: negative Hematologic/lymphatic: negative Musculoskeletal:negative Neurological: negative Behavioral/Psych: negative Endocrine: negative Allergic/Immunologic: negative   PHYSICAL EXAMINATION: General appearance: alert, cooperative, fatigued and no distress Head: Normocephalic, without obvious abnormality, atraumatic Neck: no adenopathy, no JVD, supple, symmetrical, trachea midline and thyroid not enlarged, symmetric, no tenderness/mass/nodules Lymph nodes: Cervical, supraclavicular, and axillary nodes normal. Resp: diminished breath sounds RLL and dullness to percussion RLL Back: symmetric, no curvature. ROM normal. No CVA tenderness. Cardio: regular rate and rhythm, S1, S2 normal, no murmur, click, rub or gallop GI: soft, non-tender; bowel sounds normal; no masses,  no organomegaly Extremities: extremities normal, atraumatic, no cyanosis or edema Neurologic: Alert and oriented X 3, normal strength and tone. Normal symmetric reflexes. Normal coordination and gait  ECOG PERFORMANCE STATUS: 1 - Symptomatic but completely ambulatory  Blood pressure (!) 154/79, pulse 97, temperature 97.7 F (36.5 C), temperature source Temporal, resp. rate 17, height _0  (1.6 m), weight 152 lb 11.2 oz (69.3 kg), last menstrual period 06/09/1998, SpO2 100 %.  LABORATORY DATA: Lab Results  Component Value Date   WBC 7.1 11/15/2019   HGB 10.3 (L) 11/15/2019   HCT 31.7 (L) 11/15/2019   MCV 103.6 (H) 11/15/2019   PLT 325 11/15/2019      Chemistry      Component Value Date/Time   NA 136 11/15/2019 1420   NA 137 06/09/2017 0901   K 4.2 11/15/2019 1420   K 3.9 06/09/2017 0901   CL 105 11/15/2019 1420   CO2 23 11/15/2019 1420   CO2 25 06/09/2017 0901   BUN 14 11/15/2019 1420   BUN 19.0  06/09/2017 0901   CREATININE 0.97 11/15/2019 1420   CREATININE 1.2 (H) 06/09/2017 0901   GLU 170 01/23/2016 0000      Component Value Date/Time   CALCIUM 9.3 11/15/2019 1420   CALCIUM 9.9 06/09/2017 0901   ALKPHOS 69 11/15/2019 1420   ALKPHOS 63 06/09/2017 0901   AST 26 11/15/2019 1420   AST 35 (H) 06/09/2017 0901   ALT 11 11/15/2019 1420   ALT 23 06/09/2017 0901   BILITOT 0.3 11/15/2019 1420   BILITOT 0.65 06/09/2017 0901       RADIOGRAPHIC STUDIES: DG Chest 2 View  Result Date: 12/01/2019 CLINICAL DATA:  Pneumonia, back pain, orthopnea EXAM: CHEST - 2 VIEW COMPARISON:  Radiograph 11/23/2019, CT 09/10/2019 FINDINGS: Right IJ approach Port-A-Cath tip terminates in the mid SVC. Persistent multifocal nodular masslike opacities throughout both lungs, largest in the right lower lobe demonstrating a central lucency concerning for developing cavitation. Some additional hazy opacity present in the right mid to lower lung as well. Could reflect superimposed infection on a background of the known multifocal adenocarcinoma. No pneumothorax or effusion. No acute osseous or soft tissue abnormality. Degenerative changes are present in the imaged spine and shoulders. Spinal curvature, similar to comparison. IMPRESSION: 1. Persistent multifocal nodular opacities throughout both lungs, Findings compatible with known multifocal adenocarcinoma. Largest nodule/mass in the right lower lobe demonstrating a central lucency concerning for developing cavitation. 2. Additional hazy opacity in the right mid to lower lung could reflect superimposed infection on a background of the known multifocal adenocarcinoma. Electronically Signed   By: Lovena Le M.D.   On: 12/01/2019 23:24   DG Chest 2 View  Result Date: 11/23/2019 CLINICAL DATA:  Productive cough. EXAM: CHEST - 2 VIEW COMPARISON:  August 12, 2017. FINDINGS: Stable cardiomediastinal silhouette. Right internal jugular Port-A-Cath is unchanged in position. No  pneumothorax is noted. Left lung is clear. Increased right basilar opacity is noted concerning for pneumonia or atelectasis with associated mild right pleural effusion. Bony thorax is unremarkable. IMPRESSION: Increased right basilar opacity is noted concerning for pneumonia or atelectasis with associated mild right pleural effusion. Followup PA and lateral chest X-ray is recommended in 3-4 weeks following trial of antibiotic therapy to ensure resolution and exclude underlying malignancy. Electronically Signed   By: Marijo Conception M.D.   On: 11/23/2019 12:11   CT Chest W Contrast  Result Date: 12/05/2019 CLINICAL DATA:  Lung cancer diagnosed in 2017, chemotherapy in progress, prior radiation therapy. Back pains over the last 2 days with shortness breath. EXAM: CT CHEST, ABDOMEN, AND PELVIS WITH CONTRAST TECHNIQUE: Multidetector CT imaging of the chest, abdomen and pelvis was performed following the standard protocol during bolus administration  of intravenous contrast. CONTRAST:  123m OMNIPAQUE IOHEXOL 300 MG/ML  SOLN COMPARISON:  Multiple exams, including 09/10/2019 FINDINGS: CT CHEST FINDINGS Cardiovascular: Right Port-A-Cath tip: SVC. Atherosclerotic calcification of the aortic arch. Mild cardiomegaly. Mediastinum/Nodes: No adenopathy identified. Lungs/Pleura: Biapical pleuroparenchymal scarring. Generally enlarged bilateral pulmonary nodules without new nodules identified. An index left lower lobe pulmonary nodule measures 1.9 by 1.3 cm on image 93/6, previously 1.8 by 1.1 cm. Index right upper lobe nodule 1.6 by 1.5 cm on image 43/6, previously 1.4 by 1.2 cm. Nodules with adjacent volume loss noted in the right lower lobe and right middle lobe. There is a new moderate to large right pleural effusion, nonspecific for transudative versus exudative etiology. Musculoskeletal: Stable pattern of numerous scattered small sclerotic osseous metastatic lesions. CT ABDOMEN PELVIS FINDINGS Hepatobiliary: Two stable  tiny hypodense lesions in the liver which are probably benign but technically too small to characterize. Cholelithiasis noted. No new or particularly worrisome liver lesion is identified. Pancreas: Unremarkable Spleen: Unremarkable Adrenals/Urinary Tract: Mildly dysplastic, flattened, and non rotated left kidney is pelvic in position. Adrenal glands and right kidney normal. 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 No obstruction. Normal appendix. Vascular/Lymphatic: Aortoiliac atherosclerotic vascular disease. No pathologic adenopathy. Reproductive: The uterus seems to be substantially elongated, narrow and thin and measuring up to 14.6 cm in length. Adnexa unremarkable. Other: Unremarkable Musculoskeletal: Innumerable small sclerotic metastatic lesions are scattered throughout the spine, bony pelvis, proximal femurs. No significant change from prior with regard to size or number of lesions. Levoconvex lumbar scoliosis with rotary component. In conjunction with spondylosis and degenerative disc disease there is suspected right foraminal impingement at L2-3 and L3-4 with left foraminal impingement at L3-4, L4-5, and L5-S1. IMPRESSION: 1. Generally enlarged bilateral pulmonary nodules without new nodules identified. There is a new moderate to large right pleural effusion, nonspecific for transudative versus exudative etiology. 2. Stable pattern of numerous scattered small sclerotic osseous metastatic lesions. 3. Other imaging findings of potential clinical significance: Mild cardiomegaly. Cholelithiasis. Mildly dysplastic, flattened, and non rotated left kidney is pelvic in position. 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 Lumbar scoliosis and degenerative disc disease causing multilevel impingement. Thin, elongated uterus. Aortic  Atherosclerosis (ICD10-I70.0). Electronically Signed   By: WVan ClinesM.D.   On: 12/05/2019 09:19   CT Abdomen Pelvis W Contrast  Result Date: 12/05/2019 CLINICAL DATA:  Lung cancer diagnosed in 2017, chemotherapy in progress, prior radiation therapy. Back pains over the last 2 days with shortness breath. EXAM: CT CHEST, ABDOMEN, AND PELVIS WITH CONTRAST TECHNIQUE: Multidetector CT imaging of the chest, abdomen and pelvis was performed following the standard protocol during bolus administration of intravenous contrast. CONTRAST:  1046mOMNIPAQUE IOHEXOL 300 MG/ML  SOLN COMPARISON:  Multiple exams, including 09/10/2019 FINDINGS: CT CHEST FINDINGS Cardiovascular: Right Port-A-Cath tip: SVC. Atherosclerotic calcification of the aortic arch. Mild cardiomegaly. Mediastinum/Nodes: No adenopathy identified. Lungs/Pleura: Biapical pleuroparenchymal scarring. Generally enlarged bilateral pulmonary nodules without new nodules identified. An index left lower lobe pulmonary nodule measures 1.9 by 1.3 cm on image 93/6, previously 1.8 by 1.1 cm. Index right upper lobe nodule 1.6 by 1.5 cm on image 43/6, previously 1.4 by 1.2 cm. Nodules with adjacent volume loss noted in the right lower lobe and right middle lobe. There is a new moderate to large right  pleural effusion, nonspecific for transudative versus exudative etiology. Musculoskeletal: Stable pattern of numerous scattered small sclerotic osseous metastatic lesions. CT ABDOMEN PELVIS FINDINGS Hepatobiliary: Two stable tiny hypodense lesions in the liver which are probably benign but technically too small to characterize. Cholelithiasis noted. No new or particularly worrisome liver lesion is identified. Pancreas: Unremarkable Spleen: Unremarkable Adrenals/Urinary Tract: Mildly dysplastic, flattened, and non rotated left kidney is pelvic in position. Adrenal glands and right kidney normal. Stomach/Bowel: Malrotation of bowel with most of the small bowel in the  right abdomen and much of the large bowel in the left abdomen. No obstruction. Normal appendix. Vascular/Lymphatic: Aortoiliac atherosclerotic vascular disease. No pathologic adenopathy. Reproductive: The uterus seems to be substantially elongated, narrow and thin and measuring up to 14.6 cm in length. Adnexa unremarkable. Other: Unremarkable Musculoskeletal: Innumerable small sclerotic metastatic lesions are scattered throughout the spine, bony pelvis, proximal femurs. No significant change from prior with regard to size or number of lesions. Levoconvex lumbar scoliosis with rotary component. In conjunction with spondylosis and degenerative disc disease there is suspected right foraminal impingement at L2-3 and L3-4 with left foraminal impingement at L3-4, L4-5, and L5-S1. IMPRESSION: 1. Generally enlarged bilateral pulmonary nodules without new nodules identified. There is a new moderate to large right pleural effusion, nonspecific for transudative versus exudative etiology. 2. Stable pattern of numerous scattered small sclerotic osseous metastatic lesions. 3. Other imaging findings of potential clinical significance: Mild cardiomegaly. Cholelithiasis. Mildly dysplastic, flattened, and non rotated left kidney is pelvic in position. 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 Lumbar scoliosis and degenerative disc disease causing multilevel impingement. Thin, elongated uterus. Aortic Atherosclerosis (ICD10-I70.0). Electronically Signed   By: Van Clines M.D.   On: 12/05/2019 09:19    ASSESSMENT AND PLAN:  This is a very pleasant 74 years old Hispanic female with metastatic non-small cell lung cancer, adenocarcinoma status post induction systemic chemotherapy with carboplatin and Alimta and she is currently on maintenance treatment with single agent Alimta status post 64 cycles. The patient continues to tolerate her  treatment with maintenance Alimta fairly well. She had repeat CT scan of the chest, abdomen pelvis performed recently.  I personally and independently reviewed the scan images and discussed the result and showed the images to the patient today. Her scan showed no concerning findings for disease progression except for development of new right pleural effusion.  This may explain her shortness of breath and cough recently. I recommended for the patient to continue her current treatment with maintenance Alimta and she will proceed with cycle #65 today. For the pleural effusion, I will send the patient to interventional radiology for ultrasound-guided right thoracentesis and will check the fluid for cytology. She will come back for follow-up visit in 3 weeks for evaluation before the next cycle of her treatment. For the hypertension, she was advised to take her blood pressure medication as prescribed and to monitor it closely at home. The patient was advised to call immediately if she has any concerning symptoms in the interval. For the back pain, she will continue her current treatment with Mobic and Lyrica. The patient voices understanding of current disease status and treatment options and is in agreement with the current care plan. All questions were answered. The patient knows to call the clinic with any problems, questions or concerns. We can certainly see the patient much sooner if necessary.  Disclaimer: This note  was dictated with voice recognition software. Similar sounding words can inadvertently be transcribed and may not be corrected upon review.

## 2019-12-11 ENCOUNTER — Telehealth: Payer: Self-pay | Admitting: Medical Oncology

## 2019-12-11 ENCOUNTER — Telehealth: Payer: Self-pay | Admitting: Internal Medicine

## 2019-12-11 NOTE — Telephone Encounter (Signed)
  LVm for pt -I instructed her to call central scheduling .   Calling about thoracentesis-it is not scheduled.

## 2019-12-11 NOTE — Telephone Encounter (Signed)
Added more appt per los. Called and left msg

## 2019-12-14 ENCOUNTER — Telehealth: Payer: Self-pay | Admitting: Medical Oncology

## 2019-12-14 NOTE — Telephone Encounter (Signed)
Pt calling about thoracentesis . I told her to call central scheduling -they have been trying to call her.

## 2019-12-17 ENCOUNTER — Other Ambulatory Visit (HOSPITAL_COMMUNITY)
Admission: RE | Admit: 2019-12-17 | Discharge: 2019-12-17 | Disposition: A | Discharge status: Home / Self Care | Payer: Medicare Other | Source: Ambulatory Visit | Attending: Internal Medicine | Admitting: Internal Medicine

## 2019-12-17 DIAGNOSIS — Z20822 Contact with and (suspected) exposure to covid-19: Secondary | ICD-10-CM | POA: Diagnosis not present

## 2019-12-17 LAB — SARS CORONAVIRUS 2 (TAT 6-24 HRS): SARS Coronavirus 2: NEGATIVE

## 2019-12-19 ENCOUNTER — Ambulatory Visit (HOSPITAL_COMMUNITY)
Admission: RE | Admit: 2019-12-19 | Discharge: 2019-12-19 | Disposition: A | Discharge status: Home / Self Care | Payer: Medicare Other | Source: Ambulatory Visit | Attending: Radiology | Admitting: Radiology

## 2019-12-19 ENCOUNTER — Ambulatory Visit (HOSPITAL_COMMUNITY)
Admission: RE | Admit: 2019-12-19 | Discharge: 2019-12-19 | Disposition: A | Discharge status: Home / Self Care | Payer: Medicare Other | Source: Ambulatory Visit | Attending: Internal Medicine | Admitting: Internal Medicine

## 2019-12-19 ENCOUNTER — Other Ambulatory Visit: Payer: Self-pay

## 2019-12-19 DIAGNOSIS — C7951 Secondary malignant neoplasm of bone: Secondary | ICD-10-CM | POA: Insufficient documentation

## 2019-12-19 DIAGNOSIS — J90 Pleural effusion, not elsewhere classified: Secondary | ICD-10-CM | POA: Diagnosis not present

## 2019-12-19 DIAGNOSIS — C384 Malignant neoplasm of pleura: Secondary | ICD-10-CM | POA: Diagnosis not present

## 2019-12-19 DIAGNOSIS — Z9889 Other specified postprocedural states: Secondary | ICD-10-CM

## 2019-12-19 DIAGNOSIS — Z85118 Personal history of other malignant neoplasm of bronchus and lung: Secondary | ICD-10-CM | POA: Insufficient documentation

## 2019-12-19 DIAGNOSIS — C3491 Malignant neoplasm of unspecified part of right bronchus or lung: Secondary | ICD-10-CM

## 2019-12-19 MED ORDER — LIDOCAINE HCL 1 % IJ SOLN
INTRAMUSCULAR | Status: AC
  Filled 2019-12-19: qty 20

## 2019-12-19 NOTE — Procedures (Signed)
Ultrasound-guided diagnostic and therapeutic right thoracentesis performed yielding 900 cc of blood-tinged fluid. No immediate complications. Follow-up chest x-ray pending.The fluid was sent to the lab for cytology. EBL none.

## 2019-12-20 LAB — CYTOLOGY - NON PAP

## 2019-12-21 ENCOUNTER — Ambulatory Visit (INDEPENDENT_AMBULATORY_CARE_PROVIDER_SITE_OTHER): Payer: Medicare Other | Admitting: Internal Medicine

## 2019-12-21 ENCOUNTER — Encounter: Payer: Self-pay | Admitting: Internal Medicine

## 2019-12-21 ENCOUNTER — Other Ambulatory Visit: Payer: Self-pay

## 2019-12-21 VITALS — BP 130/77 | HR 109 | Temp 98.4°F | Resp 18 | Ht 63.0 in | Wt 146.2 lb

## 2019-12-21 DIAGNOSIS — C3491 Malignant neoplasm of unspecified part of right bronchus or lung: Secondary | ICD-10-CM

## 2019-12-21 DIAGNOSIS — R21 Rash and other nonspecific skin eruption: Secondary | ICD-10-CM | POA: Diagnosis not present

## 2019-12-21 MED ORDER — TRIAMCINOLONE ACETONIDE 0.1 % EX LOTN
1.0000 | TOPICAL_LOTION | Freq: Three times a day (TID) | CUTANEOUS | 0 refills | Status: DC
Start: 2019-12-21 — End: 2020-05-08

## 2019-12-21 MED ORDER — KETOCONAZOLE 2 % EX CREA
1.0000 | TOPICAL_CREAM | Freq: Every day | CUTANEOUS | 0 refills | Status: DC
Start: 2019-12-21 — End: 2020-04-16

## 2019-12-21 NOTE — Progress Notes (Signed)
Pre visit review using our clinic review tool, if applicable. No additional management support is needed unless otherwise documented below in the visit note. 

## 2019-12-21 NOTE — Progress Notes (Signed)
Pharmacist Chemotherapy Monitoring - Follow Up Assessment    I verify that I have reviewed each item in the below checklist:  . Regimen for the patient is scheduled for the appropriate day and plan matches scheduled date. Marland Kitchen Appropriate non-routine labs are ordered dependent on drug ordered. . If applicable, additional medications reviewed and ordered per protocol based on lifetime cumulative doses and/or treatment regimen.   Plan for follow-up and/or issues identified: Yes . I-vent associated with next due treatment: Yes . MD and/or nursing notified: No   Kennith Center, Pharm.D., CPP 12/21/2019@11 :35 AM

## 2019-12-21 NOTE — Progress Notes (Signed)
Subjective:    Patient ID: Valerie Long, female    DOB: 10/25/1945, 74 y.o.   MRN: [id number]  DOS:  12/21/2019 Type of visit - description: acute Developed a rash approximately 2 months ago. Described as darker spots, started at the left shoulder, then expanded to the back and axillary areas. Very itchy at the back, not pruritic at the underarms  Recently had a right pleural effusion tapped. Denies fever chills.  Review of Systems See above   Past Medical History:  Diagnosis Date  . Adenocarcinoma of right lung, stage 4 ([place]) 2017  . Anemia   . Arthritis   . Bone metastases ([place]) 02/24/2017  . Encounter for antineoplastic chemotherapy 03/17/2016  . Hypercholesterolemia   . Hypertension 06/18/2016  . Osteopenia   . 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 Pneumonia   . Shortness of breath dyspnea     Past Surgical History:  Procedure Laterality Date  . CESAREAN SECTION     myomectomy  . COLONOSCOPY W/ POLYPECTOMY    . IR GENERIC HISTORICAL  08/17/2016   IR US GUIDE VASC ACCESS RIGHT 08/17/2016 [name], MD [facility]  . IR GENERIC HISTORICAL  08/17/2016   IR FLUORO GUIDE PORT INSERTION RIGHT 08/17/2016 [name], MD [facility]  . MEDIASTINOSCOPY N/A 09/25/2015   Procedure: MEDIASTINOSCOPY;  Surgeon: Melrose Nakayama, MD;  Location: [facility];  Service: Thoracic;  Laterality: N/A;  . VIDEO BRONCHOSCOPY Bilateral 08/19/2015   Procedure: VIDEO BRONCHOSCOPY WITH FLUORO;  Surgeon: Rigoberto Noel, MD;  Location: [facility];  Service: Cardiopulmonary;  Laterality: Bilateral;  . VIDEO BRONCHOSCOPY N/A 09/08/2015   Procedure: VIDEO BRONCHOSCOPY WITH FLUORO;  Surgeon: Rigoberto Noel, MD;  Location: [facility];  Service: Thoracic;  Laterality: N/A;  . VIDEO BRONCHOSCOPY WITH ENDOBRONCHIAL ULTRASOUND Right 09/08/2015   Procedure: ATTEMPTED VIDEO BRONCHOSCOPY ENDOBRONCHIAL ULTRASOUND  ;  Surgeon: Rigoberto Noel, MD;  Location: [facility];  Service: Thoracic;   Laterality: Right;    Allergies as of 12/21/2019   No Known Allergies     Medication List       Accurate as of December 21, 2019 11:59 PM. If you have any questions, ask your nurse or doctor.        STOP taking these medications   azithromycin 250 MG tablet Commonly known as: ZITHROMAX Stopped by: [name], MD     TAKE these medications   acetaminophen 500 MG tablet Commonly known as: TYLENOL Take 1,000 mg by mouth every 6 (six) hours as needed for moderate pain or headache.   B-12 PO Take 2 tablets by mouth daily.   budesonide-formoterol 80-4.5 MCG/ACT inhaler Commonly known as: SYMBICORT Inhale 2 puffs into the lungs 2 (two) times daily.   CALCIUM 600/VITAMIN D3 PO Take by mouth.   dexamethasone 4 MG tablet Commonly known as: DECADRON Take 4mg  by mouth twice daily the day before, of, and after chemo   folic acid 1 MG tablet Commonly known as: FOLVITE Take 1 tablet (1 mg total) by mouth daily.   HYDROcodone-homatropine 5-1.5 MG/5ML syrup Commonly known as: HYCODAN Take 5 mLs by mouth every 6 (six) hours as needed for cough.   ketoconazole 2 % cream Commonly known as: NIZORAL Apply 1 application topically daily. Started by: [name], MD   levocetirizine 5 MG tablet Commonly known as: XYZAL Take 1 tablet (5 mg total) by mouth every evening.   lidocaine-prilocaine cream Commonly known as: EMLA Apply 1  application topically as needed. Apply 1-2 tsp over port site 1-2 hours prior to chemo.   meloxicam 15 MG tablet Commonly known as: MOBIC TAKE 1 TABLET BY MOUTH EVERY DAY AS NEEDED FOR PAIN   OMEGA-3 + VITAMIN D3 PO Take 1 tablet by mouth daily.   ondansetron 8 MG tablet Commonly known as: ZOFRAN Take 8 mg by mouth every 8 (eight) hours as needed.   OVER THE COUNTER MEDICATION Apply 1 drop topically daily as needed. 1 drop CBD oil in vicks vapor rub . Applied to right thigh for pain relief.   PRESCRIPTION MEDICATION Inject 1 Dose as directed every 21 (  twenty-one) days. Chemo   triamcinolone 0.025 % ointment Commonly known as: KENALOG Apply 1 application topically 3 (three) times daily. What changed: Another medication with the same name was added. Make sure you understand how and when to take each. Changed by: [name], MD   triamcinolone lotion 0.1 % Commonly known as: KENALOG Apply 1 application topically 3 (three) times daily. What changed: You were already taking a medication with the same name, and this prescription was added. Make sure you understand how and when to take each. Changed by: [name], MD          Objective:   Physical Exam BP 130/77 (BP Location: Right Arm, Patient Position: Sitting, Cuff Size: Small)   Pulse (!) 109   Temp 98.4 F (36.9 C) (Oral)   Resp 18   Ht 5\' 3"  (1.6 m)   Wt 146 lb 4 oz (66.3 kg)   LMP 06/09/1998   SpO2 96%   BMI 25.91 kg/m  General:   Well developed, NAD, BMI noted. HEENT:  Normocephalic . Face symmetric, atraumatic Skin:  On the back has multiple moles, few areas of diffuse hyperpigmentation.  I cannot tell if the skin is dry because she applied  a lotion Under the arms, there is patches of macular hyperpigmentation with no scales or blisters Neurologic:  alert & oriented X3.  Speech normal, gait appropriate for age and unassisted Psych--  Cognition and judgment appear intact.  Cooperative with normal attention span and concentration.  Behavior appropriate. No anxious or depressed appearing.      Assessment    Assessment  Hyperlipidemia Osteopenia Left pelvic kidney -- by CT done at [place]  Non-small cell lung cancer-Stage IV (T1b, N2, M1b) . Dx  March 2017  PLAN: Rash: Nonspecific hyperpigmentation.  Related to cancer treatment?, for now recommend Kenalog lotion for the back, Mycolog for the underarms (fungal?). Non-small cell lung cancer: On alimita #65, recently had right pleural effusion tapped, pathology reviewed, will let hematology discussed  results with her   This visit occurred during the SARS-CoV-2 public health emergency.  Safety protocols were in place, including screening questions prior to the visit, additional usage of staff PPE, and extensive cleaning of exam room while observing appropriate contact time as indicated for disinfecting solutions.

## 2019-12-21 NOTE — Patient Instructions (Addendum)
Please schedule Medicare Wellness with Glenard Haring.   Apply triamcinolone lotion to the back twice a day as needed  Apply ketoconazole cream twice a day for 1 week at the underarms  Call if not gradually better

## 2019-12-22 NOTE — Assessment & Plan Note (Signed)
Rash: Nonspecific hyperpigmentation.  Related to cancer treatment?, for now recommend Kenalog lotion for the back, Mycolog for the underarms (fungal?). Non-small cell lung cancer: On alimita #65, recently had right pleural effusion tapped, pathology reviewed, will let hematology discussed results with her

## 2019-12-27 ENCOUNTER — Inpatient Hospital Stay: Payer: Medicare Other

## 2019-12-27 ENCOUNTER — Inpatient Hospital Stay (HOSPITAL_BASED_OUTPATIENT_CLINIC_OR_DEPARTMENT_OTHER): Payer: Medicare Other | Admitting: Internal Medicine

## 2019-12-27 ENCOUNTER — Encounter: Payer: Self-pay | Admitting: Internal Medicine

## 2019-12-27 ENCOUNTER — Other Ambulatory Visit: Payer: Self-pay

## 2019-12-27 VITALS — BP 154/91 | HR 97 | Temp 97.9°F | Resp 17 | Ht 63.0 in | Wt 149.9 lb

## 2019-12-27 DIAGNOSIS — M549 Dorsalgia, unspecified: Secondary | ICD-10-CM | POA: Diagnosis not present

## 2019-12-27 DIAGNOSIS — C3491 Malignant neoplasm of unspecified part of right bronchus or lung: Secondary | ICD-10-CM

## 2019-12-27 DIAGNOSIS — T451X5A Adverse effect of antineoplastic and immunosuppressive drugs, initial encounter: Secondary | ICD-10-CM

## 2019-12-27 DIAGNOSIS — I10 Essential (primary) hypertension: Secondary | ICD-10-CM | POA: Diagnosis not present

## 2019-12-27 DIAGNOSIS — E7800 Pure hypercholesterolemia, unspecified: Secondary | ICD-10-CM | POA: Diagnosis not present

## 2019-12-27 DIAGNOSIS — C3431 Malignant neoplasm of lower lobe, right bronchus or lung: Secondary | ICD-10-CM | POA: Diagnosis not present

## 2019-12-27 DIAGNOSIS — Z5111 Encounter for antineoplastic chemotherapy: Secondary | ICD-10-CM

## 2019-12-27 DIAGNOSIS — D6481 Anemia due to antineoplastic chemotherapy: Secondary | ICD-10-CM | POA: Diagnosis not present

## 2019-12-27 DIAGNOSIS — C7951 Secondary malignant neoplasm of bone: Secondary | ICD-10-CM | POA: Diagnosis not present

## 2019-12-27 DIAGNOSIS — Z95828 Presence of other vascular implants and grafts: Secondary | ICD-10-CM

## 2019-12-27 LAB — CMP (CANCER CENTER ONLY)
ALT: 9 U/L (ref 0–44)
AST: 23 U/L (ref 15–41)
Albumin: 2.9 g/dL — ABNORMAL LOW (ref 3.5–5.0)
Alkaline Phosphatase: 69 U/L (ref 38–126)
Anion gap: 11 (ref 5–15)
BUN: 16 mg/dL (ref 8–23)
CO2: 24 mmol/L (ref 22–32)
Calcium: 9.9 mg/dL (ref 8.9–10.3)
Chloride: 103 mmol/L (ref 98–111)
Creatinine: 0.95 mg/dL (ref 0.44–1.00)
GFR, Est AFR Am: >60 mL/min (ref >60)
GFR, Estimated: 60 mL/min — ABNORMAL LOW (ref >60)
Glucose, Bld: 110 mg/dL — ABNORMAL HIGH (ref 70–99)
Potassium: 4.1 mmol/L (ref 3.5–5.1)
Sodium: 138 mmol/L (ref 135–145)
Total Bilirubin: 0.3 mg/dL (ref 0.3–1.2)
Total Protein: 7.2 g/dL (ref 6.5–8.1)

## 2019-12-27 LAB — CBC WITH DIFFERENTIAL (CANCER CENTER ONLY)
Abs Immature Granulocytes: 0.03 10*3/uL (ref 0.00–0.07)
Basophils Absolute: 0 10*3/uL (ref 0.0–0.1)
Basophils Relative: 0 %
Eosinophils Absolute: 0 10*3/uL (ref 0.0–0.5)
Eosinophils Relative: 0 %
HCT: 30 % — ABNORMAL LOW (ref 36.0–46.0)
Hemoglobin: 9.8 g/dL — ABNORMAL LOW (ref 12.0–15.0)
Immature Granulocytes: 0 %
Lymphocytes Relative: 10 %
Lymphs Abs: 0.7 10*3/uL (ref 0.7–4.0)
MCH: 33 pg (ref 26.0–34.0)
MCHC: 32.7 g/dL (ref 30.0–36.0)
MCV: 101 fL — ABNORMAL HIGH (ref 80.0–100.0)
Monocytes Absolute: 0.6 10*3/uL (ref 0.1–1.0)
Monocytes Relative: 8 %
Neutro Abs: 5.7 10*3/uL (ref 1.7–7.7)
Neutrophils Relative %: 82 %
Platelet Count: 392 10*3/uL (ref 150–400)
RBC: 2.97 MIL/uL — ABNORMAL LOW (ref 3.87–5.11)
RDW: 15 % (ref 11.5–15.5)
WBC Count: 6.9 10*3/uL (ref 4.0–10.5)
nRBC: 0 % (ref 0.0–0.2)

## 2019-12-27 MED ORDER — ZOLEDRONIC ACID 4 MG/100ML IV SOLN
INTRAVENOUS | Status: AC
  Filled 2019-12-27: qty 100

## 2019-12-27 MED ORDER — SODIUM CHLORIDE 0.9% FLUSH
10.0000 mL | Freq: Once | INTRAVENOUS | Status: AC
  Administered 2019-12-27: 10 mL
  Filled 2019-12-27: qty 10

## 2019-12-27 MED ORDER — SODIUM CHLORIDE 0.9% FLUSH
10.0000 mL | INTRAVENOUS | Status: DC | PRN
  Administered 2019-12-27: 10 mL
  Filled 2019-12-27: qty 10

## 2019-12-27 MED ORDER — HEPARIN SOD (PORK) LOCK FLUSH 100 UNIT/ML IV SOLN
500.0000 [IU] | Freq: Once | INTRAVENOUS | Status: AC | PRN
  Administered 2019-12-27: 500 [IU]
  Filled 2019-12-27: qty 5

## 2019-12-27 MED ORDER — ZOLEDRONIC ACID 4 MG/100ML IV SOLN
4.0000 mg | Freq: Once | INTRAVENOUS | Status: AC
  Administered 2019-12-27: 4 mg via INTRAVENOUS

## 2019-12-27 MED ORDER — SODIUM CHLORIDE 0.9 % IV SOLN
500.0000 mg/m2 | Freq: Once | INTRAVENOUS | Status: AC
  Administered 2019-12-27: 900 mg via INTRAVENOUS
  Filled 2019-12-27: qty 20

## 2019-12-27 MED ORDER — PROCHLORPERAZINE MALEATE 10 MG PO TABS
10.0000 mg | ORAL_TABLET | Freq: Once | ORAL | Status: AC
  Administered 2019-12-27: 10 mg via ORAL

## 2019-12-27 MED ORDER — PROCHLORPERAZINE MALEATE 10 MG PO TABS
ORAL_TABLET | ORAL | Status: AC
  Filled 2019-12-27: qty 1

## 2019-12-27 MED ORDER — SODIUM CHLORIDE 0.9 % IV SOLN
Freq: Once | INTRAVENOUS | Status: AC
  Filled 2019-12-27: qty 250

## 2019-12-27 NOTE — Patient Instructions (Signed)
[facility] Discharge Instructions for Patients Receiving Chemotherapy  Today you received the following chemotherapy agents: Alimta  To help prevent nausea and vomiting after your treatment, we encourage you to take your nausea medication as directed.    If you develop nausea and vomiting that is not controlled by your nausea medication, call the clinic.   BELOW ARE SYMPTOMS THAT SHOULD BE REPORTED IMMEDIATELY:  *FEVER GREATER THAN 100.5 F  *CHILLS WITH OR WITHOUT FEVER  NAUSEA AND VOMITING THAT IS NOT CONTROLLED WITH YOUR NAUSEA MEDICATION  *UNUSUAL SHORTNESS OF BREATH  *UNUSUAL BRUISING OR BLEEDING  TENDERNESS IN MOUTH AND THROAT WITH OR WITHOUT PRESENCE OF ULCERS  *URINARY PROBLEMS  *BOWEL PROBLEMS  UNUSUAL RASH Items with * indicate a potential emergency and should be followed up as soon as possible.  Feel free to call the clinic should you have any questions or concerns. The clinic phone number is [phone].  Please show the [facility] at check-in to the Emergency Department and triage nurse.

## 2019-12-27 NOTE — Progress Notes (Signed)
[place] [phone]   Fax:([phone]) [place], MD [place] [address] [place] [address]  DIAGNOSIS: Stage IV (T1b, N2, M1b) non-small cell lung cancer, adenocarcinoma diagnosed in March 2017 and presented with right lower lobe lung nodule in addition to mediastinal lymphadenopathy and metastatic bone lesions.  Molecular studies: PDL 1 TPS  <1%.   Foundation One Studies: Positive for ERBB2 [id number]. Negative for EGFR, KRAS, ALK, BRAF, MET, RET and ROS1.  PRIOR THERAPY:  1) Induction systemic chemotherapy with carboplatin for AUC of 5 and Alimta 500 MG/M2 every 3 weeks is status post 6 cycles at [facility], last dose was given 03/05/2016 with stable disease. 2) palliative radiation to the metastatic bone disease in the lower back and pelvic area.  CURRENT THERAPY::  1)  Maintenance systemic chemotherapy with Alimta 500 MG/M2 every 3 weeks status post 65 cycles. First dose was given 03/25/2016.  2) Zometa 4 mg IV every 12 week for metastatic bone disease.  INTERVAL HIST Valerie Long 74 y.o. female returns to the clinic today for follow-up visit.  The patient is feeling fine today with no concerning complaints.  She underwent ultrasound guided right thoracentesis with drainage of 900 cc of pleural fluid.  The patient continues to have mild pain on the right side of the chest and shortness of breath with exertion with mild cough and no hemoptysis.  She has no nausea, vomiting, diarrhea or constipation.  She denied having any headache or visual changes.  She has no fever or chills.  She is here today for evaluation before starting cycle #66 of her treatment.  MEDICAL HISTORY: Past Medical History:  Diagnosis Date  . Adenocarcinoma of right lung, stage 4 ([place]) 2017  . Anemia   . Arthritis   . Bone metastases ([place]) 02/24/2017  . Encounter for antineoplastic chemotherapy 03/17/2016  .  Hypercholesterolemia   . Hypertension 06/18/2016  . Osteopenia   . 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 Pneumonia   . Shortness of breath dyspnea     ALLERGIES:  has No Known Allergies.  MEDICATIONS:  Current Outpatient Medications  Medication Sig Dispense Refill  . acetaminophen (TYLENOL) 500 MG tablet Take 1,000 mg by mouth every 6 (six) hours as needed for moderate pain or headache.    . budesonide-formoterol (SYMBICORT) 80-4.5 MCG/ACT inhaler Inhale 2 puffs into the lungs 2 (two) times daily. 1 Inhaler 3  . Calcium Carb-Cholecalciferol (CALCIUM 600/VITAMIN D3 PO) Take by mouth.    . Cyanocobalamin (B-12 PO) Take 2 tablets by mouth daily.    Marland Kitchen dexamethasone (DECADRON) 4 MG tablet Take 21m by mouth twice daily the day before, of, and after chemo (Patient not taking: Reported on 12/21/2019) 40 tablet 1  . Fish Oil-Cholecalciferol (OMEGA-3 + VITAMIN D3 PO) Take 1 tablet by mouth daily.    . folic acid (FOLVITE) 1 MG tablet Take 1 tablet (1 mg total) by mouth daily. 90 tablet 0  . HYDROcodone-homatropine (HYCODAN) 5-1.5 MG/5ML syrup Take 5 mLs by mouth every 6 (six) hours as needed for cough. (Patient not taking: Reported on 12/21/2019) 100 mL 0  . ketoconazole (NIZORAL) 2 % cream Apply 1 application topically daily. 30 g 0  . levocetirizine (XYZAL) 5 MG tablet Take 1 tablet (5 mg total) by mouth every evening. 30 tablet 3  . lidocaine-prilocaine (EMLA) cream Apply 1 application topically  as needed. Apply 1-2 tsp over port site 1-2 hours prior to chemo. 30 g 0  . meloxicam (MOBIC) 15 MG tablet TAKE 1 TABLET BY MOUTH EVERY DAY AS NEEDED FOR PAIN 30 tablet 0  . ondansetron (ZOFRAN) 8 MG tablet Take 8 mg by mouth every 8 (eight) hours as needed. (Patient not taking: Reported on 12/21/2019)    . OVER THE COUNTER MEDICATION Apply 1 drop topically daily as needed. 1 drop CBD oil in vicks vapor rub . Applied to right thigh for pain relief.    Marland Kitchen PRESCRIPTION MEDICATION Inject 1 Dose  as directed every 21 ( twenty-one) days. Chemo    . triamcinolone (KENALOG) 0.025 % ointment Apply 1 application topically 3 (three) times daily. 30 g 0  . triamcinolone lotion (KENALOG) 0.1 % Apply 1 application topically 3 (three) times daily. 120 mL 0   No current facility-administered medications for this visit.   Facility-Administered Medications Ordered in Other Visits  Medication Dose Route Frequency Provider Last Rate Last Admin  . sodium chloride flush (NS) 0.9 % injection 10 mL  10 mL Intracatheter PRN Curt Bears, MD   10 mL at 11/15/19 1400    SURGICAL HISTORY:  Past Surgical History:  Procedure Laterality Date  . CESAREAN SECTION     myomectomy  . COLONOSCOPY W/ POLYPECTOMY    . IR GENERIC HISTORICAL  08/17/2016   IR US GUIDE VASC ACCESS RIGHT 08/17/2016 [name], MD [facility]  . IR GENERIC HISTORICAL  08/17/2016   IR FLUORO GUIDE PORT INSERTION RIGHT 08/17/2016 [name], MD [facility]  . MEDIASTINOSCOPY N/A 09/25/2015   Procedure: MEDIASTINOSCOPY;  Surgeon: Melrose Nakayama, MD;  Location: [facility];  Service: Thoracic;  Laterality: N/A;  . VIDEO BRONCHOSCOPY Bilateral 08/19/2015   Procedure: VIDEO BRONCHOSCOPY WITH FLUORO;  Surgeon: Rigoberto Noel, MD;  Location: [facility];  Service: Cardiopulmonary;  Laterality: Bilateral;  . VIDEO BRONCHOSCOPY N/A 09/08/2015   Procedure: VIDEO BRONCHOSCOPY WITH FLUORO;  Surgeon: Rigoberto Noel, MD;  Location: [facility];  Service: Thoracic;  Laterality: N/A;  . VIDEO BRONCHOSCOPY WITH ENDOBRONCHIAL ULTRASOUND Right 09/08/2015   Procedure: ATTEMPTED VIDEO BRONCHOSCOPY ENDOBRONCHIAL ULTRASOUND  ;  Surgeon: Rigoberto Noel, MD;  Location: [facility];  Service: Thoracic;  Laterality: Right;    REVIEW OF SYSTEMS:  A comprehensive review of systems was negative except for: Constitutional: positive for fatigue Respiratory: positive for dyspnea on exertion and pleurisy/chest pain Musculoskeletal: positive for bone pain   PHYSICAL  EXAMINATION: General appearance: alert, cooperative, fatigued and no distress Head: Normocephalic, without obvious abnormality, atraumatic Neck: no adenopathy, no JVD, supple, symmetrical, trachea midline and thyroid not enlarged, symmetric, no tenderness/mass/nodules Lymph nodes: Cervical, supraclavicular, and axillary nodes normal. Resp: clear to auscultation bilaterally Back: symmetric, no curvature. ROM normal. No CVA tenderness. Cardio: regular rate and rhythm, S1, S2 normal, no murmur, click, rub or gallop GI: soft, non-tender; bowel sounds normal; no masses,  no organomegaly Extremities: extremities normal, atraumatic, no cyanosis or edema  ECOG PERFORMANCE STATUS: 1 - Symptomatic but completely ambulatory  Blood pressure (!) 154/91, pulse 97, temperature 97.9 F (36.6 C), temperature source Temporal, resp. rate 17, height _0  (1.6 m), weight 149 lb 14.4 oz (68 kg), last menstrual period 06/09/1998, SpO2 100 %.  LABORATORY DATA: Lab Results  Component Value Date   WBC 6.9 12/27/2019   HGB 9.8 (L) 12/27/2019   HCT 30.0 (L) 12/27/2019   MCV 101.0 (H) 12/27/2019   PLT 392 12/27/2019  Chemistry      Component Value Date/Time   NA 137 12/06/2019 0903   NA 137 06/09/2017 0901   K 3.8 12/06/2019 0903   K 3.9 06/09/2017 0901   CL 102 12/06/2019 0903   CO2 24 12/06/2019 0903   CO2 25 06/09/2017 0901   BUN 14 12/06/2019 0903   BUN 19.0 06/09/2017 0901   CREATININE 1.12 (H) 12/06/2019 0903   CREATININE 1.2 (H) 06/09/2017 0901   GLU 170 01/23/2016 0000      Component Value Date/Time   CALCIUM 9.6 12/06/2019 0903   CALCIUM 9.9 06/09/2017 0901   ALKPHOS 70 12/06/2019 0903   ALKPHOS 63 06/09/2017 0901   AST 29 12/06/2019 0903   AST 35 (H) 06/09/2017 0901   ALT 13 12/06/2019 0903   ALT 23 06/09/2017 0901   BILITOT 0.3 12/06/2019 0903   BILITOT 0.65 06/09/2017 0901       RADIOGRAPHIC STUDIES: DG Chest 1 View  Result Date: 12/19/2019 CLINICAL DATA:  Post right  thoracentesis EXAM: CHEST  1 VIEW COMPARISON:  11/30/2019 FINDINGS: Right Port-A-Cath remains in place, unchanged. Small right pleural effusion. No pneumothorax. Persistent nodular opacities throughout the lungs, unchanged. Heart is normal size. No acute bony abnormality. IMPRESSION: Stable persistent nodular opacities throughout the lungs. Small right effusion.  No pneumothorax following thoracentesis. Electronically Signed   By: Rolm Baptise M.D.   On: 12/19/2019 12:35   DG Chest 2 View  Result Date: 12/01/2019 CLINICAL DATA:  Pneumonia, back pain, orthopnea EXAM: CHEST - 2 VIEW COMPARISON:  Radiograph 11/23/2019, CT 09/10/2019 FINDINGS: Right IJ approach Port-A-Cath tip terminates in the mid SVC. Persistent multifocal nodular masslike opacities throughout both lungs, largest in the right lower lobe demonstrating a central lucency concerning for developing cavitation. Some additional hazy opacity present in the right mid to lower lung as well. Could reflect superimposed infection on a background of the known multifocal adenocarcinoma. No pneumothorax or effusion. No acute osseous or soft tissue abnormality. Degenerative changes are present in the imaged spine and shoulders. Spinal curvature, similar to comparison. IMPRESSION: 1. Persistent multifocal nodular opacities throughout both lungs, Findings compatible with known multifocal adenocarcinoma. Largest nodule/mass in the right lower lobe demonstrating a central lucency concerning for developing cavitation. 2. Additional hazy opacity in the right mid to lower lung could reflect superimposed infection on a background of the known multifocal adenocarcinoma. Electronically Signed   By: Lovena Le M.D.   On: 12/01/2019 23:24   CT Chest W Contrast  Result Date: 12/05/2019 CLINICAL DATA:  Lung cancer diagnosed in 2017, chemotherapy in progress, prior radiation therapy. Back pains over the last 2 days with shortness breath. EXAM: CT CHEST, ABDOMEN, AND PELVIS  WITH CONTRAST TECHNIQUE: Multidetector CT imaging of the chest, abdomen and pelvis was performed following the standard protocol during bolus administration of intravenous contrast. CONTRAST:  113m OMNIPAQUE IOHEXOL 300 MG/ML  SOLN COMPARISON:  Multiple exams, including 09/10/2019 FINDINGS: CT CHEST FINDINGS Cardiovascular: Right Port-A-Cath tip: SVC. Atherosclerotic calcification of the aortic arch. Mild cardiomegaly. Mediastinum/Nodes: No adenopathy identified. Lungs/Pleura: Biapical pleuroparenchymal scarring. Generally enlarged bilateral pulmonary nodules without new nodules identified. An index left lower lobe pulmonary nodule measures 1.9 by 1.3 cm on image 93/6, previously 1.8 by 1.1 cm. Index right upper lobe nodule 1.6 by 1.5 cm on image 43/6, previously 1.4 by 1.2 cm. Nodules with adjacent volume loss noted in the right lower lobe and right middle lobe. There is a new moderate to large right pleural effusion, nonspecific for transudative  versus exudative etiology. Musculoskeletal: Stable pattern of numerous scattered small sclerotic osseous metastatic lesions. CT ABDOMEN PELVIS FINDINGS Hepatobiliary: Two stable tiny hypodense lesions in the liver which are probably benign but technically too small to characterize. Cholelithiasis noted. No new or particularly worrisome liver lesion is identified. Pancreas: Unremarkable Spleen: Unremarkable Adrenals/Urinary Tract: Mildly dysplastic, flattened, and non rotated left kidney is pelvic in position. Adrenal glands and right kidney normal. 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 No obstruction. Normal appendix. Vascular/Lymphatic: Aortoiliac atherosclerotic vascular disease. No pathologic adenopathy. Reproductive: The uterus seems to be substantially elongated, narrow and thin and measuring up to 14.6 cm in length. Adnexa unremarkable. Other: Unremarkable  Musculoskeletal: Innumerable small sclerotic metastatic lesions are scattered throughout the spine, bony pelvis, proximal femurs. No significant change from prior with regard to size or number of lesions. Levoconvex lumbar scoliosis with rotary component. In conjunction with spondylosis and degenerative disc disease there is suspected right foraminal impingement at L2-3 and L3-4 with left foraminal impingement at L3-4, L4-5, and L5-S1. IMPRESSION: 1. Generally enlarged bilateral pulmonary nodules without new nodules identified. There is a new moderate to large right pleural effusion, nonspecific for transudative versus exudative etiology. 2. Stable pattern of numerous scattered small sclerotic osseous metastatic lesions. 3. Other imaging findings of potential clinical significance: Mild cardiomegaly. Cholelithiasis. Mildly dysplastic, flattened, and non rotated left kidney is pelvic in position. 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 Lumbar scoliosis and degenerative disc disease causing multilevel impingement. Thin, elongated uterus. Aortic Atherosclerosis (ICD10-I70.0). Electronically Signed   By: Van Clines M.D.   On: 12/05/2019 09:19   CT Abdomen Pelvis W Contrast  Result Date: 12/05/2019 CLINICAL DATA:  Lung cancer diagnosed in 2017, chemotherapy in progress, prior radiation therapy. Back pains over the last 2 days with shortness breath. EXAM: CT CHEST, ABDOMEN, AND PELVIS WITH CONTRAST TECHNIQUE: Multidetector CT imaging of the chest, abdomen and pelvis was performed following the standard protocol during bolus administration of intravenous contrast. CONTRAST:  142m OMNIPAQUE IOHEXOL 300 MG/ML  SOLN COMPARISON:  Multiple exams, including 09/10/2019 FINDINGS: CT CHEST FINDINGS Cardiovascular: Right Port-A-Cath tip: SVC. Atherosclerotic calcification of the aortic arch. Mild cardiomegaly. Mediastinum/Nodes: No  adenopathy identified. Lungs/Pleura: Biapical pleuroparenchymal scarring. Generally enlarged bilateral pulmonary nodules without new nodules identified. An index left lower lobe pulmonary nodule measures 1.9 by 1.3 cm on image 93/6, previously 1.8 by 1.1 cm. Index right upper lobe nodule 1.6 by 1.5 cm on image 43/6, previously 1.4 by 1.2 cm. Nodules with adjacent volume loss noted in the right lower lobe and right middle lobe. There is a new moderate to large right pleural effusion, nonspecific for transudative versus exudative etiology. Musculoskeletal: Stable pattern of numerous scattered small sclerotic osseous metastatic lesions. CT ABDOMEN PELVIS FINDINGS Hepatobiliary: Two stable tiny hypodense lesions in the liver which are probably benign but technically too small to characterize. Cholelithiasis noted. No new or particularly worrisome liver lesion is identified. Pancreas: Unremarkable Spleen: Unremarkable Adrenals/Urinary Tract: Mildly dysplastic, flattened, and non rotated left kidney is pelvic in position. Adrenal glands and right kidney normal. 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 No obstruction. Normal appendix. Vascular/Lymphatic: Aortoiliac atherosclerotic vascular disease. No pathologic adenopathy. Reproductive: The uterus seems  to be substantially elongated, narrow and thin and measuring up to 14.6 cm in length. Adnexa unremarkable. Other: Unremarkable Musculoskeletal: Innumerable small sclerotic metastatic lesions are scattered throughout the spine, bony pelvis, proximal femurs. No significant change from prior with regard to size or number of lesions. Levoconvex lumbar scoliosis with rotary component. In conjunction with spondylosis and degenerative disc disease there is suspected right foraminal impingement at L2-3 and L3-4 with left foraminal impingement at L3-4, L4-5, and L5-S1. IMPRESSION: 1.  Generally enlarged bilateral pulmonary nodules without new nodules identified. There is a new moderate to large right pleural effusion, nonspecific for transudative versus exudative etiology. 2. Stable pattern of numerous scattered small sclerotic osseous metastatic lesions. 3. Other imaging findings of potential clinical significance: Mild cardiomegaly. Cholelithiasis. Mildly dysplastic, flattened, and non rotated left kidney is pelvic in position. 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 Lumbar scoliosis and degenerative disc disease causing multilevel impingement. Thin, elongated uterus. Aortic Atherosclerosis (ICD10-I70.0). Electronically Signed   By: Van Clines M.D.   On: 12/05/2019 09:19   US Thoracentesis Asp Pleural space w/IMG guide  Result Date: 12/19/2019 INDICATION: Patient with history of lung adenocarcinoma, dyspnea, right pleural effusion. Request made for diagnostic and therapeutic right thoracentesis. EXAM: ULTRASOUND GUIDED DIAGNOSTIC AND THERAPEUTIC RIGHT THORACENTESIS MEDICATIONS: 1% lidocaine to skin and subcutaneous tissue COMPLICATIONS: None immediate. PROCEDURE: An ultrasound guided thoracentesis was thoroughly discussed with the patient and questions answered. The benefits, risks, alternatives and complications were also discussed. The patient understands and wishes to proceed with the procedure. Written consent was obtained. Ultrasound was performed to localize and mark an adequate pocket of fluid in the right chest. The area was then prepped and draped in the normal sterile fashion. 1% Lidocaine was used for local anesthesia. Under ultrasound guidance a 6 Fr Safe-T-Centesis catheter was introduced. Thoracentesis was performed. The catheter was removed and a dressing applied. FINDINGS: A total of approximately 900 cc of blood-tinged fluid was removed. Samples were sent to the laboratory as  requested by the clinical team. IMPRESSION: Successful ultrasound guided diagnostic and therapeutic right thoracentesis yielding 900 cc of pleural fluid. Read by: Rowe Robert, PA-C Electronically Signed   By: [name] M.D.   On: 12/19/2019 12:34    ASSESSMENT AND PLAN:  This is a very pleasant 74 years old Hispanic female with metastatic non-small cell lung cancer, adenocarcinoma status post induction systemic chemotherapy with carboplatin and Alimta and she is currently on maintenance treatment with single agent Alimta status post 65 cycles. The patient has been tolerating her treatment well with no significant adverse effects. I recommended for her to proceed with cycle #66 today as planned. For the history of pleural effusion, she underwent ultrasound-guided thoracentesis and she is feeling better. For the back pain, she will continue her current treatment with Mobic and Lyrica. The patient will come back for follow-up visit in 3 weeks for evaluation before starting cycle #67. She was advised to call immediately if she has any concerning symptoms in the interval. The patient voices understanding of current disease status and treatment options and is in agreement with the current care plan. All questions were answered. The patient knows to call the clinic with any problems, questions or concerns. We can certainly see the patient much sooner if necessary.  Disclaimer: This note was dictated with voice recognition software. Similar sounding words can inadvertently be transcribed and may not  be corrected upon review.

## 2019-12-28 ENCOUNTER — Telehealth: Payer: Self-pay | Admitting: Internal Medicine

## 2019-12-28 NOTE — Telephone Encounter (Signed)
Scheduled per los. Called and left msg. Mailed printout  °

## 2020-01-17 ENCOUNTER — Encounter: Payer: Self-pay | Admitting: Internal Medicine

## 2020-01-17 ENCOUNTER — Inpatient Hospital Stay: Payer: Medicare Other

## 2020-01-17 ENCOUNTER — Other Ambulatory Visit: Payer: Self-pay

## 2020-01-17 ENCOUNTER — Inpatient Hospital Stay: Payer: Medicare Other | Attending: Internal Medicine | Admitting: Internal Medicine

## 2020-01-17 VITALS — BP 148/75 | HR 79 | Temp 97.5°F | Resp 20 | Ht 63.0 in | Wt 149.4 lb

## 2020-01-17 DIAGNOSIS — Z7952 Long term (current) use of systemic steroids: Secondary | ICD-10-CM | POA: Diagnosis not present

## 2020-01-17 DIAGNOSIS — Z7951 Long term (current) use of inhaled steroids: Secondary | ICD-10-CM | POA: Diagnosis not present

## 2020-01-17 DIAGNOSIS — Z95828 Presence of other vascular implants and grafts: Secondary | ICD-10-CM

## 2020-01-17 DIAGNOSIS — M8580 Other specified disorders of bone density and structure, unspecified site: Secondary | ICD-10-CM | POA: Insufficient documentation

## 2020-01-17 DIAGNOSIS — C3491 Malignant neoplasm of unspecified part of right bronchus or lung: Secondary | ICD-10-CM

## 2020-01-17 DIAGNOSIS — I10 Essential (primary) hypertension: Secondary | ICD-10-CM | POA: Diagnosis not present

## 2020-01-17 DIAGNOSIS — C3431 Malignant neoplasm of lower lobe, right bronchus or lung: Secondary | ICD-10-CM | POA: Diagnosis not present

## 2020-01-17 DIAGNOSIS — Z79899 Other long term (current) drug therapy: Secondary | ICD-10-CM | POA: Diagnosis not present

## 2020-01-17 DIAGNOSIS — E7800 Pure hypercholesterolemia, unspecified: Secondary | ICD-10-CM | POA: Insufficient documentation

## 2020-01-17 DIAGNOSIS — D649 Anemia, unspecified: Secondary | ICD-10-CM | POA: Diagnosis not present

## 2020-01-17 DIAGNOSIS — Z5111 Encounter for antineoplastic chemotherapy: Secondary | ICD-10-CM | POA: Diagnosis not present

## 2020-01-17 DIAGNOSIS — C7951 Secondary malignant neoplasm of bone: Secondary | ICD-10-CM | POA: Insufficient documentation

## 2020-01-17 LAB — CMP (CANCER CENTER ONLY)
ALT: 8 U/L (ref 0–44)
AST: 22 U/L (ref 15–41)
Albumin: 2.8 g/dL — ABNORMAL LOW (ref 3.5–5.0)
Alkaline Phosphatase: 68 U/L (ref 38–126)
Anion gap: 9 (ref 5–15)
BUN: 16 mg/dL (ref 8–23)
CO2: 24 mmol/L (ref 22–32)
Calcium: 9.2 mg/dL (ref 8.9–10.3)
Chloride: 105 mmol/L (ref 98–111)
Creatinine: 1.03 mg/dL — ABNORMAL HIGH (ref 0.44–1.00)
GFR, Est AFR Am: >60 mL/min (ref >60)
GFR, Estimated: 54 mL/min — ABNORMAL LOW (ref >60)
Glucose, Bld: 151 mg/dL — ABNORMAL HIGH (ref 70–99)
Potassium: 3.7 mmol/L (ref 3.5–5.1)
Sodium: 138 mmol/L (ref 135–145)
Total Bilirubin: 0.3 mg/dL (ref 0.3–1.2)
Total Protein: 6.8 g/dL (ref 6.5–8.1)

## 2020-01-17 LAB — CBC WITH DIFFERENTIAL (CANCER CENTER ONLY)
Abs Immature Granulocytes: 0.01 10*3/uL (ref 0.00–0.07)
Basophils Absolute: 0 10*3/uL (ref 0.0–0.1)
Basophils Relative: 0 %
Eosinophils Absolute: 0 10*3/uL (ref 0.0–0.5)
Eosinophils Relative: 0 %
HCT: 28.8 % — ABNORMAL LOW (ref 36.0–46.0)
Hemoglobin: 9.1 g/dL — ABNORMAL LOW (ref 12.0–15.0)
Immature Granulocytes: 0 %
Lymphocytes Relative: 12 %
Lymphs Abs: 0.7 10*3/uL (ref 0.7–4.0)
MCH: 32.5 pg (ref 26.0–34.0)
MCHC: 31.6 g/dL (ref 30.0–36.0)
MCV: 102.9 fL — ABNORMAL HIGH (ref 80.0–100.0)
Monocytes Absolute: 0.7 10*3/uL (ref 0.1–1.0)
Monocytes Relative: 13 %
Neutro Abs: 4.1 10*3/uL (ref 1.7–7.7)
Neutrophils Relative %: 75 %
Platelet Count: 344 10*3/uL (ref 150–400)
RBC: 2.8 MIL/uL — ABNORMAL LOW (ref 3.87–5.11)
RDW: 16.4 % — ABNORMAL HIGH (ref 11.5–15.5)
WBC Count: 5.5 10*3/uL (ref 4.0–10.5)
nRBC: 0 % (ref 0.0–0.2)

## 2020-01-17 MED ORDER — HEPARIN SOD (PORK) LOCK FLUSH 100 UNIT/ML IV SOLN
500.0000 [IU] | Freq: Once | INTRAVENOUS | Status: AC | PRN
  Administered 2020-01-17: 500 [IU]
  Filled 2020-01-17: qty 5

## 2020-01-17 MED ORDER — GABAPENTIN 100 MG PO CAPS: 200.0000 mg | ORAL_CAPSULE | Freq: Three times a day (TID) | ORAL | 0 refills | Status: DC

## 2020-01-17 MED ORDER — SODIUM CHLORIDE 0.9% FLUSH
10.0000 mL | Freq: Once | INTRAVENOUS | Status: AC
  Administered 2020-01-17: 10 mL
  Filled 2020-01-17: qty 10

## 2020-01-17 MED ORDER — SODIUM CHLORIDE 0.9 % IV SOLN
Freq: Once | INTRAVENOUS | Status: AC
  Filled 2020-01-17: qty 250

## 2020-01-17 MED ORDER — PROCHLORPERAZINE MALEATE 10 MG PO TABS
ORAL_TABLET | ORAL | Status: AC
  Filled 2020-01-17: qty 1

## 2020-01-17 MED ORDER — SODIUM CHLORIDE 0.9 % IV SOLN
500.0000 mg/m2 | Freq: Once | INTRAVENOUS | Status: AC
  Administered 2020-01-17: 900 mg via INTRAVENOUS
  Filled 2020-01-17: qty 20

## 2020-01-17 MED ORDER — CYANOCOBALAMIN 1000 MCG/ML IJ SOLN
1000.0000 ug | Freq: Once | INTRAMUSCULAR | Status: AC
  Administered 2020-01-17: 1000 ug via INTRAMUSCULAR

## 2020-01-17 MED ORDER — SODIUM CHLORIDE 0.9% FLUSH
10.0000 mL | INTRAVENOUS | Status: DC | PRN
  Administered 2020-01-17: 10 mL
  Filled 2020-01-17: qty 10

## 2020-01-17 MED ORDER — CYANOCOBALAMIN 1000 MCG/ML IJ SOLN
INTRAMUSCULAR | Status: AC
  Filled 2020-01-17: qty 1

## 2020-01-17 MED ORDER — PROCHLORPERAZINE MALEATE 10 MG PO TABS
10.0000 mg | ORAL_TABLET | Freq: Once | ORAL | Status: AC
  Administered 2020-01-17: 10 mg via ORAL

## 2020-01-17 NOTE — Progress Notes (Signed)
[facility] [phone]) [phone]   Fax:([phone]) Dustin, MD [address] [place] [address]  DIAGNOSIS: Stage IV (T1b, N2, M1b) non-small cell lung cancer, adenocarcinoma diagnosed in March 2017 and presented with right lower lobe lung nodule in addition to mediastinal lymphadenopathy and metastatic bone lesions.  Molecular studies: PDL 1 TPS  <1%.   Foundation One Studies: Positive for ERBB2 [id number]. Negative for EGFR, KRAS, ALK, BRAF, MET, RET and ROS1.  PRIOR THERAPY:  1) Induction systemic chemotherapy with carboplatin for AUC of 5 and Alimta 500 MG/M2 every 3 weeks is status post 6 cycles at the [facility], last dose was given 03/05/2016 with stable disease. 2) palliative radiation to the metastatic bone disease in the lower back and pelvic area.  CURRENT THERAPY::  1)  Maintenance systemic chemotherapy with Alimta 500 MG/M2 every 3 weeks status post 66 cycles. First dose was given 03/25/2016.  2) Zometa 4 mg IV every 12 week for metastatic bone disease.  INTERVAL HIST Valerie Long 74 y.o. female returns to the clinic today for follow-up visit.  The patient is feeling fine today with no concerning complaints except for the intermittent low back pain.  She also has some swelling of the lower extremities.  The patient also continues to complain of fatigue.  She denied having any current chest pain, shortness of breath, cough or hemoptysis.  She denied having any fever or chills.  She has no nausea, vomiting, diarrhea or constipation.  She has no headache or visual changes.  She is here today for evaluation before starting cycle #67 of her treatment.  MEDICAL HISTORY: Past Medical History:  Diagnosis Date  . Adenocarcinoma of right lung, stage 4 ([place]) 2017  . Anemia   . Arthritis   . Bone metastases ([place]) 02/24/2017  . Encounter for antineoplastic chemotherapy 03/17/2016  .  Hypercholesterolemia   . Hypertension 06/18/2016  . Osteopenia   . 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 Pneumonia   . Shortness of breath dyspnea     ALLERGIES:  has No Known Allergies.  MEDICATIONS:  Current Outpatient Medications  Medication Sig Dispense Refill  . acetaminophen (TYLENOL) 500 MG tablet Take 1,000 mg by mouth every 6 (six) hours as needed for moderate pain or headache.    . budesonide-formoterol (SYMBICORT) 80-4.5 MCG/ACT inhaler Inhale 2 puffs into the lungs 2 (two) times daily. 1 Inhaler 3  . Calcium Carb-Cholecalciferol (CALCIUM 600/VITAMIN D3 PO) Take by mouth.    . Cyanocobalamin (B-12 PO) Take 2 tablets by mouth daily.    Marland Kitchen dexamethasone (DECADRON) 4 MG tablet Take 83m by mouth twice daily the day before, of, and after chemo (Patient not taking: Reported on 12/21/2019) 40 tablet 1  . Fish Oil-Cholecalciferol (OMEGA-3 + VITAMIN D3 PO) Take 1 tablet by mouth daily.    . folic acid (FOLVITE) 1 MG tablet Take 1 tablet (1 mg total) by mouth daily. 90 tablet 0  . HYDROcodone-homatropine (HYCODAN) 5-1.5 MG/5ML syrup Take 5 mLs by mouth every 6 (six) hours as needed for cough. (Patient not taking: Reported on 12/21/2019) 100 mL 0  . ketoconazole (NIZORAL) 2 % cream Apply 1 application topically daily. 30 g 0  . levocetirizine (XYZAL) 5 MG tablet Take 1 tablet (5 mg total) by mouth every evening. 30 tablet 3  . lidocaine-prilocaine (EMLA) cream Apply 1 application topically as needed.  Apply 1-2 tsp over port site 1-2 hours prior to chemo. 30 g 0  . meloxicam (MOBIC) 15 MG tablet TAKE 1 TABLET BY MOUTH EVERY DAY AS NEEDED FOR PAIN 30 tablet 0  . ondansetron (ZOFRAN) 8 MG tablet Take 8 mg by mouth every 8 (eight) hours as needed. (Patient not taking: Reported on 12/21/2019)    . OVER THE COUNTER MEDICATION Apply 1 drop topically daily as needed. 1 drop CBD oil in vicks vapor rub . Applied to right thigh for pain relief.    Marland Kitchen PRESCRIPTION MEDICATION Inject 1 Dose  as directed every 21 ( twenty-one) days. Chemo    . triamcinolone (KENALOG) 0.025 % ointment Apply 1 application topically 3 (three) times daily. 30 g 0  . triamcinolone lotion (KENALOG) 0.1 % Apply 1 application topically 3 (three) times daily. 120 mL 0   No current facility-administered medications for this visit.   Facility-Administered Medications Ordered in Other Visits  Medication Dose Route Frequency Provider Last Rate Last Admin  . sodium chloride flush (NS) 0.9 % injection 10 mL  10 mL Intracatheter PRN Curt Bears, MD   10 mL at 11/15/19 1400    SURGICAL HISTORY:  Past Surgical History:  Procedure Laterality Date  . CESAREAN SECTION     myomectomy  . COLONOSCOPY W/ POLYPECTOMY    . IR GENERIC HISTORICAL  08/17/2016   IR US GUIDE VASC ACCESS RIGHT 08/17/2016 [name], MD [facility]  . IR GENERIC HISTORICAL  08/17/2016   IR FLUORO GUIDE PORT INSERTION RIGHT 08/17/2016 [name], MD [facility]  . MEDIASTINOSCOPY N/A 09/25/2015   Procedure: MEDIASTINOSCOPY;  Surgeon: Melrose Nakayama, MD;  Location: [facility];  Service: Thoracic;  Laterality: N/A;  . VIDEO BRONCHOSCOPY Bilateral 08/19/2015   Procedure: VIDEO BRONCHOSCOPY WITH FLUORO;  Surgeon: Rigoberto Noel, MD;  Location: [facility];  Service: Cardiopulmonary;  Laterality: Bilateral;  . VIDEO BRONCHOSCOPY N/A 09/08/2015   Procedure: VIDEO BRONCHOSCOPY WITH FLUORO;  Surgeon: Rigoberto Noel, MD;  Location: [facility];  Service: Thoracic;  Laterality: N/A;  . VIDEO BRONCHOSCOPY WITH ENDOBRONCHIAL ULTRASOUND Right 09/08/2015   Procedure: ATTEMPTED VIDEO BRONCHOSCOPY ENDOBRONCHIAL ULTRASOUND  ;  Surgeon: Rigoberto Noel, MD;  Location: [facility];  Service: Thoracic;  Laterality: Right;    REVIEW OF SYSTEMS:  A comprehensive review of systems was negative except for: Constitutional: positive for fatigue Musculoskeletal: positive for bone pain   PHYSICAL EXAMINATION: General appearance: alert, cooperative, fatigued and no  distress Head: Normocephalic, without obvious abnormality, atraumatic Neck: no adenopathy, no JVD, supple, symmetrical, trachea midline and thyroid not enlarged, symmetric, no tenderness/mass/nodules Lymph nodes: Cervical, supraclavicular, and axillary nodes normal. Resp: clear to auscultation bilaterally Back: symmetric, no curvature. ROM normal. No CVA tenderness. Cardio: regular rate and rhythm, S1, S2 normal, no murmur, click, rub or gallop GI: soft, non-tender; bowel sounds normal; no masses,  no organomegaly Extremities: extremities normal, atraumatic, no cyanosis or edema  ECOG PERFORMANCE STATUS: 1 - Symptomatic but completely ambulatory  Blood pressure (!) 148/75, pulse 79, temperature (!) 97.5 F (36.4 C), temperature source Tympanic, resp. rate 20, height _0  (1.6 m), weight 149 lb 6.4 oz (67.8 kg), last menstrual period 06/09/1998, SpO2 100 %.  LABORATORY DATA: Lab Results  Component Value Date   WBC 6.9 12/27/2019   HGB 9.8 (L) 12/27/2019   HCT 30.0 (L) 12/27/2019   MCV 101.0 (H) 12/27/2019   PLT 392 12/27/2019      Chemistry      Component  Value Date/Time   NA 138 12/27/2019 0904   NA 137 06/09/2017 0901   K 4.1 12/27/2019 0904   K 3.9 06/09/2017 0901   CL 103 12/27/2019 0904   CO2 24 12/27/2019 0904   CO2 25 06/09/2017 0901   BUN 16 12/27/2019 0904   BUN 19.0 06/09/2017 0901   CREATININE 0.95 12/27/2019 0904   CREATININE 1.2 (H) 06/09/2017 0901   GLU 170 01/23/2016 0000      Component Value Date/Time   CALCIUM 9.9 12/27/2019 0904   CALCIUM 9.9 06/09/2017 0901   ALKPHOS 69 12/27/2019 0904   ALKPHOS 63 06/09/2017 0901   AST 23 12/27/2019 0904   AST 35 (H) 06/09/2017 0901   ALT 9 12/27/2019 0904   ALT 23 06/09/2017 0901   BILITOT 0.3 12/27/2019 0904   BILITOT 0.65 06/09/2017 0901       RADIOGRAPHIC STUDIES: DG Chest 1 View  Result Date: 12/19/2019 CLINICAL DATA:  Post right thoracentesis EXAM: CHEST  1 VIEW COMPARISON:  11/30/2019 FINDINGS:  Right Port-A-Cath remains in place, unchanged. Small right pleural effusion. No pneumothorax. Persistent nodular opacities throughout the lungs, unchanged. Heart is normal size. No acute bony abnormality. IMPRESSION: Stable persistent nodular opacities throughout the lungs. Small right effusion.  No pneumothorax following thoracentesis. Electronically Signed   By: Rolm Baptise M.D.   On: 12/19/2019 12:35   US Thoracentesis Asp Pleural space w/IMG guide  Result Date: 12/19/2019 INDICATION: Patient with history of lung adenocarcinoma, dyspnea, right pleural effusion. Request made for diagnostic and therapeutic right thoracentesis. EXAM: ULTRASOUND GUIDED DIAGNOSTIC AND THERAPEUTIC RIGHT THORACENTESIS MEDICATIONS: 1% lidocaine to skin and subcutaneous tissue COMPLICATIONS: None immediate. PROCEDURE: An ultrasound guided thoracentesis was thoroughly discussed with the patient and questions answered. The benefits, risks, alternatives and complications were also discussed. The patient understands and wishes to proceed with the procedure. Written consent was obtained. Ultrasound was performed to localize and mark an adequate pocket of fluid in the right chest. The area was then prepped and draped in the normal sterile fashion. 1% Lidocaine was used for local anesthesia. Under ultrasound guidance a 6 Fr Safe-T-Centesis catheter was introduced. Thoracentesis was performed. The catheter was removed and a dressing applied. FINDINGS: A total of approximately 900 cc of blood-tinged fluid was removed. Samples were sent to the laboratory as requested by the clinical team. IMPRESSION: Successful ultrasound guided diagnostic and therapeutic right thoracentesis yielding 900 cc of pleural fluid. Read by: Rowe Prakriti Carignan, PA-C Electronically Signed   By: [name] M.D.   On: 12/19/2019 12:34    ASSESSMENT AND PLAN:  This is a very pleasant 74 years old Hispanic female with metastatic non-small cell lung cancer,  adenocarcinoma status post induction systemic chemotherapy with carboplatin and Alimta and she is currently on maintenance treatment with single agent Alimta status post 66 cycles. She has been tolerating this treatment well with no concerning adverse effects. I recommended for her to proceed with cycle #67 today as planned. For the back pain she will resume her treatment with Mobic and Lyrica. For the anemia she was advised to take oral iron tablets at regular basis. The patient will come back for follow-up visit in 3 weeks for evaluation before the next cycle of her treatment. She was advised to call immediately if she has any concerning symptoms in the interval. The patient voices understanding of current disease status and treatment options and is in agreement with the current care plan. All questions were answered. The patient knows to call the  clinic with any problems, questions or concerns. We can certainly see the patient much sooner if necessary.  Disclaimer: This note was dictated with voice recognition software. Similar sounding words can inadvertently be transcribed and may not be corrected upon review.

## 2020-01-17 NOTE — Patient Instructions (Signed)

## 2020-01-17 NOTE — Patient Instructions (Signed)
[facility] Discharge Instructions for Patients Receiving Chemotherapy  Today you received the following chemotherapy agents: Alimta  To help prevent nausea and vomiting after your treatment, we encourage you to take your nausea medication as directed.    If you develop nausea and vomiting that is not controlled by your nausea medication, call the clinic.   BELOW ARE SYMPTOMS THAT SHOULD BE REPORTED IMMEDIATELY:  *FEVER GREATER THAN 100.5 F  *CHILLS WITH OR WITHOUT FEVER  NAUSEA AND VOMITING THAT IS NOT CONTROLLED WITH YOUR NAUSEA MEDICATION  *UNUSUAL SHORTNESS OF BREATH  *UNUSUAL BRUISING OR BLEEDING  TENDERNESS IN MOUTH AND THROAT WITH OR WITHOUT PRESENCE OF ULCERS  *URINARY PROBLEMS  *BOWEL PROBLEMS  UNUSUAL RASH Items with * indicate a potential emergency and should be followed up as soon as possible.  Feel free to call the clinic should you have any questions or concerns. The clinic phone number is [phone].  Please show the [facility] at check-in to the Emergency Department and triage nurse.

## 2020-02-02 NOTE — Progress Notes (Signed)
[place], MD [address] [place] [address]  DIAGNOSIS: Stage IV (T1b, N2, M1b) non-small cell lung cancer, adenocarcinoma diagnosed in March 2017 and presented with right lower lobe lung nodule in addition to mediastinal lymphadenopathy and metastatic bone lesions.   Molecular studies:PDL 1 TPS <1%.   [facility] Studies: Positive for ERBB2 [id number]. Negative for EGFR, KRAS, ALK, BRAF, MET, RET and ROS1.  PRIOR THERAPY: 1) Induction systemic chemotherapy with carboplatin for AUC of 5 and Alimta 500 MG/M2 every 3 weeks is status post 6 cycles at the [facility], last dose was given 03/05/2016 with stable disease. 2) palliative radiation to the metastatic bone disease in the lower back and pelvic area  CURRENT THERAPY:  1) Maintenance systemic chemotherapy with Alimta 500 MG/M2 every 3 weeks status post67cycles. First dose was given 03/25/2016.  2) Zometa 4 mg IV every 12 week for metastatic bone disease.  INTERVAL HISTORY: Valerie Long 74 y.o. female returns to the clinic today for a follow up visit. The patient is feeling well today without any concerning complaints except for her chronic back pain which she follows with neurology. She is taking gabapentin.  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 She still is endorsing a cough, particularly at night, but it is not as bad as it was. She is requesting a refill of her tessalon. Denies any significant shortness of breath. Otherwise, The patient continues to tolerate treatment withsystemic chemotherapy with Alimtawell without any adverse sideeffectsexcept facial flushing from her steroid pre-medication. Denies anyfevers, chills,or night sweats. She lost about 3 lbs since her last appointment due to not feeling as hungry. Denies any hemoptysisor chest pain. Denies any vomiting,  diarrhea, or constipationbut reports very mild nausea1 day following her last treatment. Denies any headache or visual changes. She is asking if it is safe to schedule her mammogram and colonoscopy. The patient was here today for a follow up visit before starting cycle #68.   MEDICAL HISTORY: Past Medical History:  Diagnosis Date  . Adenocarcinoma of right lung, stage 4 ([place]) 2017  . Anemia   . Arthritis   . Bone metastases ([place]) 02/24/2017  . Encounter for antineoplastic chemotherapy 03/17/2016  . Hypercholesterolemia   . Hypertension 06/18/2016  . Osteopenia   . 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 Pneumonia   . Shortness of breath dyspnea     ALLERGIES:  has No Known Allergies.  MEDICATIONS:  Current Outpatient Medications  Medication Sig Dispense Refill  . acetaminophen (TYLENOL) 500 MG tablet Take 1,000 mg by mouth every 6 (six) hours as needed for moderate pain or headache.    . benzonatate (TESSALON) 100 MG capsule Take 1 capsule (100 mg total) by mouth 3 (three) times daily as needed for cough. 60 capsule 0  . budesonide-formoterol (SYMBICORT) 80-4.5 MCG/ACT inhaler Inhale 2 puffs into the lungs 2 (two) times daily. 1 Inhaler 3  . Calcium Carb-Cholecalciferol (CALCIUM 600/VITAMIN D3 PO) Take by mouth.    . Cyanocobalamin (B-12 PO) Take 2 tablets by mouth daily.    Marland Kitchen dexamethasone (DECADRON) 4 MG tablet Take 42m by mouth twice daily the day before, of, and after chemo (Patient not taking: Reported on 12/21/2019) 40 tablet 1  . Fish Oil-Cholecalciferol (OMEGA-3 + VITAMIN D3 PO) Take 1 tablet by mouth daily.    . folic acid (  FOLVITE) 1 MG tablet TAKE 1 TABLET BY MOUTH EVERY DAY 90 tablet 0  . gabapentin (NEURONTIN) 100 MG capsule Take 2 capsules (200 mg total) by mouth 3 (three) times daily. 180 capsule 0  . HYDROcodone-homatropine (HYCODAN) 5-1.5 MG/5ML syrup Take 5 mLs by mouth every 6 (six) hours as needed for cough. (Patient not taking: Reported on 12/21/2019)  100 mL 0  . ketoconazole (NIZORAL) 2 % cream Apply 1 application topically daily. 30 g 0  . levocetirizine (XYZAL) 5 MG tablet Take 1 tablet (5 mg total) by mouth every evening. 30 tablet 3  . lidocaine-prilocaine (EMLA) cream Apply 1 application topically as needed. Apply 1-2 tsp over port site 1-2 hours prior to chemo. 30 g 0  . meloxicam (MOBIC) 15 MG tablet TAKE 1 TABLET BY MOUTH EVERY DAY AS NEEDED FOR PAIN 30 tablet 0  . ondansetron (ZOFRAN) 8 MG tablet Take 8 mg by mouth every 8 (eight) hours as needed. (Patient not taking: Reported on 12/21/2019)    . OVER THE COUNTER MEDICATION Apply 1 drop topically daily as needed. 1 drop CBD oil in vicks vapor rub . Applied to right thigh for pain relief.    Marland Kitchen PRESCRIPTION MEDICATION Inject 1 Dose as directed every 21 ( twenty-one) days. Chemo    . triamcinolone (KENALOG) 0.025 % ointment Apply 1 application topically 3 (three) times daily. 30 g 0  . triamcinolone lotion (KENALOG) 0.1 % Apply 1 application topically 3 (three) times daily. 120 mL 0   No current facility-administered medications for this visit.   Facility-Administered Medications Ordered in Other Visits  Medication Dose Route Frequency Provider Last Rate Last Admin  . sodium chloride flush (NS) 0.9 % injection 10 mL  10 mL Intracatheter PRN Curt Bears, MD   10 mL at 11/15/19 1400    SURGICAL HISTORY:  Past Surgical History:  Procedure Laterality Date  . CESAREAN SECTION     myomectomy  . COLONOSCOPY W/ POLYPECTOMY    . IR GENERIC HISTORICAL  08/17/2016   IR US GUIDE VASC ACCESS RIGHT 08/17/2016 [name], MD [facility]  . IR GENERIC HISTORICAL  08/17/2016   IR FLUORO GUIDE PORT INSERTION RIGHT 08/17/2016 [name], MD [facility]  . MEDIASTINOSCOPY N/A 09/25/2015   Procedure: MEDIASTINOSCOPY;  Surgeon: Melrose Nakayama, MD;  Location: [place];  Service: Thoracic;  Laterality: N/A;  . VIDEO BRONCHOSCOPY Bilateral 08/19/2015   Procedure: VIDEO BRONCHOSCOPY  WITH FLUORO;  Surgeon: Rigoberto Noel, MD;  Location: [place];  Service: Cardiopulmonary;  Laterality: Bilateral;  . VIDEO BRONCHOSCOPY N/A 09/08/2015   Procedure: VIDEO BRONCHOSCOPY WITH FLUORO;  Surgeon: Rigoberto Noel, MD;  Location: [facility];  Service: Thoracic;  Laterality: N/A;  . VIDEO BRONCHOSCOPY WITH ENDOBRONCHIAL ULTRASOUND Right 09/08/2015   Procedure: ATTEMPTED VIDEO BRONCHOSCOPY ENDOBRONCHIAL ULTRASOUND  ;  Surgeon: Rigoberto Noel, MD;  Location: [facility];  Service: Thoracic;  Laterality: Right;    REVIEW OF SYSTEMS:   Review of Systems  Constitutional: Positive for appetite change and 3 lb weight loss. Negative for chills, fatigue, and fever.  HENT: Negative for mouth sores, nosebleeds, sore throat and trouble swallowing.   Eyes: Negative for eye problems and icterus.  Respiratory: Positive for cough. Negative for hemoptysis, shortness of breath and wheezing.   Cardiovascular: Positive for chronic bilateral lower extremity swelling. Negative for chest pain. Gastrointestinal: Negative for abdominal pain, constipation, diarrhea, nausea and vomiting.  Genitourinary: Negative for bladder incontinence, difficulty urinating, dysuria, frequency and hematuria.  Musculoskeletal: Positive for chronic back pain. Negative for gait problem, neck pain and neck stiffness.  Skin: Negative for itching and rash.  Neurological: Negative for dizziness, extremity weakness, gait problem, headaches, light-headedness and seizures.  Hematological: Negative for adenopathy. Does not bruise/bleed easily.  Psychiatric/Behavioral: Negative for confusion, depression and sleep disturbance. The patient is not nervous/anxious.     PHYSICAL EXAMINATION:  Blood pressure (!) 155/84, pulse 80, temperature 97.9 F (36.6 C), temperature source Tympanic, resp. rate 20, height '5\' 3"'  (1.6 m), weight 146 lb 3.2 oz (66.3 kg), last menstrual period 06/09/1998, SpO2 100 %.  ECOG PERFORMANCE STATUS: 1 - Symptomatic but  completely ambulatory  Physical Exam  Constitutional: Oriented to person, place, and time and well-developed, well-nourished, and in no distress.  HENT:  Head: Normocephalic and atraumatic.  Mouth/Throat: Oropharynx is clear and moist. No oropharyngeal exudate.  Eyes: Conjunctivae are normal. Right eye exhibits no discharge. Left eye exhibits no discharge. No scleral icterus.  Neck: Normal range of motion. Neck supple.  Cardiovascular: Normal rate, regular rhythm, normal heart sounds and intact distal pulses.  Pulmonary/Chest: Effort normal and breath sounds norma except diminished breath sounds at base of right lung. No respiratory distress. No wheezes. No rales.  Abdominal: Soft. Bowel sounds are normal. Exhibits no distension and no mass. There is no tenderness.  Musculoskeletal: Positive for bilateral lower extremity edema (stable).Normal range of motion.  Lymphadenopathy:  No cervical adenopathy.  Neurological: Alert and oriented to person, place, and time. Exhibits normal muscle tone. Gait normal but uses cane for assistance. Coordination normal.  Skin: Skin is warm and dry. No rash noted. Not diaphoretic. No erythema. No pallor.  Psychiatric: Mood, memory and judgment normal.  Vitals reviewed.  LABORATORY DATA: Lab Results  Component Value Date   WBC 5.9 02/07/2020   HGB 9.5 (L) 02/07/2020   HCT 29.6 (L) 02/07/2020   MCV 100.7 (H) 02/07/2020   PLT 375 02/07/2020      Chemistry      Component Value Date/Time   NA 138 01/17/2020 0834   NA 137 06/09/2017 0901   K 3.7 01/17/2020 0834   K 3.9 06/09/2017 0901   CL 105 01/17/2020 0834   CO2 24 01/17/2020 0834   CO2 25 06/09/2017 0901   BUN 16 01/17/2020 0834   BUN 19.0 06/09/2017 0901   CREATININE 1.03 (H) 01/17/2020 0834   CREATININE 1.2 (H) 06/09/2017 0901   GLU 170 01/23/2016 0000      Component Value Date/Time   CALCIUM 9.2 01/17/2020 0834   CALCIUM 9.9 06/09/2017 0901   ALKPHOS 68 01/17/2020 0834   ALKPHOS  63 06/09/2017 0901   AST 22 01/17/2020 0834   AST 35 (H) 06/09/2017 0901   ALT 8 01/17/2020 0834   ALT 23 06/09/2017 0901   BILITOT 0.3 01/17/2020 0834   BILITOT 0.65 06/09/2017 0901       RADIOGRAPHIC STUDIES:  No results found.   ASSESSMENT/PLAN:  This is a very pleasant 74 year old Hispanic female diagnosed with metastatic non-small cell lung cancer, adenocarcinoma. She presented with a right lower lobe lung nodule in addition to mediastinal lymphadenopathy and metastatic bone lesions. She was diagnosed in March 2017. Her PDL 1 expression is <1% and she has no actionable mutations.   She previously underwent inductiontreatmentwith systemic chemotherapy withCarboplatin and Alimta. She is currently undergoing maintenance therapy withAlimta. She is status post67cycles. The patient has been tolerating this treatment fairly well without any concerning adversesideeffects.  Labs were reviewed. Recommend that she proceed  with cycle #68today as scheduled.   I will arrange for a restaging CT scan of her chest, abdomen, and pelvis, prior to her next cycle of treatment.   We will see her back for a follow up visit in 3 weeks for evaluation and and to review her scan results before starting cycle #69  I have sent a refill of tessalon to her pharmacy.   Discussed it is ok to schedule her screening mammogram and colonoscopy.   The patient was advised to call immediately if she has any concerning symptoms in the interval. The patient voices understanding of current disease status and treatment options and is in agreement with the current care plan. All questions were answered. The patient knows to call the clinic with any problems, questions or concerns. We can certainly see the patient much sooner if necessary    Orders Placed This Encounter  Procedures  . CT Abdomen Pelvis W Contrast    Standing Status:   Future    Standing Expiration Date:   02/06/2021    Order Specific  Question:   If indicated for the ordered procedure, I authorize the administration of contrast media per Radiology protocol    Answer:   Yes    Order Specific Question:   Preferred imaging location?    Answer:   [facility]    Order Specific Question:   Is Oral Contrast requested for this exam?    Answer:   Yes, Per Radiology protocol    Order Specific Question:   Radiology Contrast Protocol - do NOT remove file path    Answer:   [email or web address]  . CT Chest W Contrast    Standing Status:   Future    Standing Expiration Date:   02/06/2021    Order Specific Question:   If indicated for the ordered procedure, I authorize the administration of contrast media per Radiology protocol    Answer:   Yes    Order Specific Question:   Preferred imaging location?    Answer:   [facility]    Order Specific Question:   Radiology Contrast Protocol - do NOT remove file path    Answer:   [email or web address]     [place], PA-C 02/07/20

## 2020-02-03 ENCOUNTER — Other Ambulatory Visit: Payer: Self-pay | Admitting: Physician Assistant

## 2020-02-03 DIAGNOSIS — C3491 Malignant neoplasm of unspecified part of right bronchus or lung: Secondary | ICD-10-CM

## 2020-02-03 DIAGNOSIS — C3490 Malignant neoplasm of unspecified part of unspecified bronchus or lung: Secondary | ICD-10-CM

## 2020-02-03 DIAGNOSIS — Z5111 Encounter for antineoplastic chemotherapy: Secondary | ICD-10-CM

## 2020-02-07 ENCOUNTER — Inpatient Hospital Stay: Payer: Medicare Other | Attending: Internal Medicine | Admitting: Physician Assistant

## 2020-02-07 ENCOUNTER — Inpatient Hospital Stay: Payer: Medicare Other

## 2020-02-07 ENCOUNTER — Other Ambulatory Visit: Payer: Self-pay

## 2020-02-07 VITALS — BP 155/84 | HR 80 | Temp 97.9°F | Resp 20 | Ht 63.0 in | Wt 146.2 lb

## 2020-02-07 DIAGNOSIS — Z5111 Encounter for antineoplastic chemotherapy: Secondary | ICD-10-CM | POA: Insufficient documentation

## 2020-02-07 DIAGNOSIS — Z79899 Other long term (current) drug therapy: Secondary | ICD-10-CM | POA: Insufficient documentation

## 2020-02-07 DIAGNOSIS — I10 Essential (primary) hypertension: Secondary | ICD-10-CM | POA: Diagnosis not present

## 2020-02-07 DIAGNOSIS — C3431 Malignant neoplasm of lower lobe, right bronchus or lung: Secondary | ICD-10-CM | POA: Diagnosis not present

## 2020-02-07 DIAGNOSIS — Z7952 Long term (current) use of systemic steroids: Secondary | ICD-10-CM | POA: Insufficient documentation

## 2020-02-07 DIAGNOSIS — C3491 Malignant neoplasm of unspecified part of right bronchus or lung: Secondary | ICD-10-CM

## 2020-02-07 DIAGNOSIS — R05 Cough: Secondary | ICD-10-CM

## 2020-02-07 DIAGNOSIS — E7800 Pure hypercholesterolemia, unspecified: Secondary | ICD-10-CM | POA: Diagnosis not present

## 2020-02-07 DIAGNOSIS — M8580 Other specified disorders of bone density and structure, unspecified site: Secondary | ICD-10-CM | POA: Diagnosis not present

## 2020-02-07 DIAGNOSIS — Z7951 Long term (current) use of inhaled steroids: Secondary | ICD-10-CM | POA: Diagnosis not present

## 2020-02-07 DIAGNOSIS — Z95828 Presence of other vascular implants and grafts: Secondary | ICD-10-CM

## 2020-02-07 DIAGNOSIS — R059 Cough, unspecified: Secondary | ICD-10-CM

## 2020-02-07 DIAGNOSIS — Z791 Long term (current) use of non-steroidal anti-inflammatories (NSAID): Secondary | ICD-10-CM | POA: Insufficient documentation

## 2020-02-07 DIAGNOSIS — C7951 Secondary malignant neoplasm of bone: Secondary | ICD-10-CM | POA: Diagnosis not present

## 2020-02-07 DIAGNOSIS — D6481 Anemia due to antineoplastic chemotherapy: Secondary | ICD-10-CM | POA: Diagnosis not present

## 2020-02-07 DIAGNOSIS — G8929 Other chronic pain: Secondary | ICD-10-CM | POA: Diagnosis not present

## 2020-02-07 LAB — CMP (CANCER CENTER ONLY)
ALT: 8 U/L (ref 0–44)
AST: 23 U/L (ref 15–41)
Albumin: 3.1 g/dL — ABNORMAL LOW (ref 3.5–5.0)
Alkaline Phosphatase: 75 U/L (ref 38–126)
Anion gap: 9 (ref 5–15)
BUN: 15 mg/dL (ref 8–23)
CO2: 25 mmol/L (ref 22–32)
Calcium: 10.1 mg/dL (ref 8.9–10.3)
Chloride: 103 mmol/L (ref 98–111)
Creatinine: 0.89 mg/dL (ref 0.44–1.00)
GFR, Est AFR Am: >60 mL/min (ref >60)
GFR, Estimated: >60 mL/min (ref >60)
Glucose, Bld: 96 mg/dL (ref 70–99)
Potassium: 3.9 mmol/L (ref 3.5–5.1)
Sodium: 137 mmol/L (ref 135–145)
Total Bilirubin: 0.4 mg/dL (ref 0.3–1.2)
Total Protein: 7.2 g/dL (ref 6.5–8.1)

## 2020-02-07 LAB — CBC WITH DIFFERENTIAL (CANCER CENTER ONLY)
Abs Immature Granulocytes: 0.02 10*3/uL (ref 0.00–0.07)
Basophils Absolute: 0 10*3/uL (ref 0.0–0.1)
Basophils Relative: 0 %
Eosinophils Absolute: 0 10*3/uL (ref 0.0–0.5)
Eosinophils Relative: 0 %
HCT: 29.6 % — ABNORMAL LOW (ref 36.0–46.0)
Hemoglobin: 9.5 g/dL — ABNORMAL LOW (ref 12.0–15.0)
Immature Granulocytes: 0 %
Lymphocytes Relative: 13 %
Lymphs Abs: 0.8 10*3/uL (ref 0.7–4.0)
MCH: 32.3 pg (ref 26.0–34.0)
MCHC: 32.1 g/dL (ref 30.0–36.0)
MCV: 100.7 fL — ABNORMAL HIGH (ref 80.0–100.0)
Monocytes Absolute: 0.7 10*3/uL (ref 0.1–1.0)
Monocytes Relative: 12 %
Neutro Abs: 4.4 10*3/uL (ref 1.7–7.7)
Neutrophils Relative %: 75 %
Platelet Count: 375 10*3/uL (ref 150–400)
RBC: 2.94 MIL/uL — ABNORMAL LOW (ref 3.87–5.11)
RDW: 15.6 % — ABNORMAL HIGH (ref 11.5–15.5)
WBC Count: 5.9 10*3/uL (ref 4.0–10.5)
nRBC: 0 % (ref 0.0–0.2)

## 2020-02-07 MED ORDER — SODIUM CHLORIDE 0.9% FLUSH
10.0000 mL | INTRAVENOUS | Status: DC | PRN
  Administered 2020-02-07: 10 mL
  Filled 2020-02-07: qty 10

## 2020-02-07 MED ORDER — BENZONATATE 100 MG PO CAPS: 100.0000 mg | ORAL_CAPSULE | Freq: Three times a day (TID) | ORAL | 0 refills | Status: DC | PRN

## 2020-02-07 MED ORDER — PROCHLORPERAZINE MALEATE 10 MG PO TABS
10.0000 mg | ORAL_TABLET | Freq: Once | ORAL | Status: AC
  Administered 2020-02-07: 10 mg via ORAL

## 2020-02-07 MED ORDER — HEPARIN SOD (PORK) LOCK FLUSH 100 UNIT/ML IV SOLN
500.0000 [IU] | Freq: Once | INTRAVENOUS | Status: AC | PRN
  Administered 2020-02-07: 500 [IU]
  Filled 2020-02-07: qty 5

## 2020-02-07 MED ORDER — PROCHLORPERAZINE MALEATE 10 MG PO TABS
ORAL_TABLET | ORAL | Status: AC
  Filled 2020-02-07: qty 1

## 2020-02-07 MED ORDER — SODIUM CHLORIDE 0.9% FLUSH
10.0000 mL | Freq: Once | INTRAVENOUS | Status: AC
  Administered 2020-02-07: 10 mL
  Filled 2020-02-07: qty 10

## 2020-02-07 MED ORDER — SODIUM CHLORIDE 0.9 % IV SOLN
500.0000 mg/m2 | Freq: Once | INTRAVENOUS | Status: AC
  Administered 2020-02-07: 900 mg via INTRAVENOUS
  Filled 2020-02-07: qty 20

## 2020-02-07 MED ORDER — SODIUM CHLORIDE 0.9 % IV SOLN
Freq: Once | INTRAVENOUS | Status: AC
  Filled 2020-02-07: qty 250

## 2020-02-07 NOTE — Patient Instructions (Signed)
[facility] Discharge Instructions for Patients Receiving Chemotherapy  Today you received the following chemotherapy agents: Pemetrexed (Alimta)  To help prevent nausea and vomiting after your treatment, we encourage you to take your nausea medication as directed by your MD.   If you develop nausea and vomiting that is not controlled by your nausea medication, call the clinic.   BELOW ARE SYMPTOMS THAT SHOULD BE REPORTED IMMEDIATELY:  *FEVER GREATER THAN 100.5 F  *CHILLS WITH OR WITHOUT FEVER  NAUSEA AND VOMITING THAT IS NOT CONTROLLED WITH YOUR NAUSEA MEDICATION  *UNUSUAL SHORTNESS OF BREATH  *UNUSUAL BRUISING OR BLEEDING  TENDERNESS IN MOUTH AND THROAT WITH OR WITHOUT PRESENCE OF ULCERS  *URINARY PROBLEMS  *BOWEL PROBLEMS  UNUSUAL RASH Items with * indicate a potential emergency and should be followed up as soon as possible.  Feel free to call the clinic should you have any questions or concerns. The clinic phone number is [phone].  Please show the [place] [facility] at check-in to the Emergency Department and triage nurse.

## 2020-02-13 ENCOUNTER — Other Ambulatory Visit: Payer: Self-pay | Admitting: Medical

## 2020-02-13 ENCOUNTER — Telehealth: Payer: Self-pay | Admitting: Internal Medicine

## 2020-02-13 NOTE — Telephone Encounter (Signed)
Scheduled per los. Called and left msg. Mailed printout  °

## 2020-02-19 ENCOUNTER — Other Ambulatory Visit: Payer: Self-pay | Admitting: Internal Medicine

## 2020-02-19 DIAGNOSIS — Z0000 Encounter for general adult medical examination without abnormal findings: Secondary | ICD-10-CM

## 2020-02-25 ENCOUNTER — Other Ambulatory Visit: Payer: Self-pay

## 2020-02-25 ENCOUNTER — Ambulatory Visit (HOSPITAL_COMMUNITY)
Admission: RE | Admit: 2020-02-25 | Discharge: 2020-02-25 | Disposition: A | Discharge status: Home / Self Care | Payer: Medicare Other | Source: Ambulatory Visit | Attending: Physician Assistant | Admitting: Physician Assistant

## 2020-02-25 DIAGNOSIS — C7951 Secondary malignant neoplasm of bone: Secondary | ICD-10-CM | POA: Diagnosis not present

## 2020-02-25 DIAGNOSIS — R918 Other nonspecific abnormal finding of lung field: Secondary | ICD-10-CM | POA: Diagnosis not present

## 2020-02-25 DIAGNOSIS — C3490 Malignant neoplasm of unspecified part of unspecified bronchus or lung: Secondary | ICD-10-CM | POA: Diagnosis not present

## 2020-02-25 DIAGNOSIS — I700 Atherosclerosis of aorta: Secondary | ICD-10-CM | POA: Diagnosis not present

## 2020-02-25 DIAGNOSIS — C3491 Malignant neoplasm of unspecified part of right bronchus or lung: Secondary | ICD-10-CM | POA: Diagnosis not present

## 2020-02-25 DIAGNOSIS — K8020 Calculus of gallbladder without cholecystitis without obstruction: Secondary | ICD-10-CM | POA: Diagnosis not present

## 2020-02-25 DIAGNOSIS — N2889 Other specified disorders of kidney and ureter: Secondary | ICD-10-CM | POA: Diagnosis not present

## 2020-02-25 MED ORDER — IOHEXOL 300 MG/ML  SOLN
100.0000 mL | Freq: Once | INTRAMUSCULAR | Status: AC | PRN
  Administered 2020-02-25: 100 mL via INTRAVENOUS

## 2020-02-27 ENCOUNTER — Other Ambulatory Visit: Payer: Self-pay

## 2020-02-27 ENCOUNTER — Inpatient Hospital Stay (HOSPITAL_BASED_OUTPATIENT_CLINIC_OR_DEPARTMENT_OTHER): Payer: Medicare Other | Admitting: Internal Medicine

## 2020-02-27 ENCOUNTER — Inpatient Hospital Stay: Payer: Medicare Other

## 2020-02-27 ENCOUNTER — Encounter: Payer: Self-pay | Admitting: Internal Medicine

## 2020-02-27 ENCOUNTER — Inpatient Hospital Stay: Payer: Medicare Other | Admitting: Internal Medicine

## 2020-02-27 ENCOUNTER — Telehealth: Payer: Self-pay | Admitting: Medical Oncology

## 2020-02-27 VITALS — BP 135/82 | HR 99 | Temp 98.4°F | Resp 18 | Wt 146.6 lb

## 2020-02-27 DIAGNOSIS — C3491 Malignant neoplasm of unspecified part of right bronchus or lung: Secondary | ICD-10-CM

## 2020-02-27 DIAGNOSIS — C7951 Secondary malignant neoplasm of bone: Secondary | ICD-10-CM | POA: Diagnosis not present

## 2020-02-27 DIAGNOSIS — E7800 Pure hypercholesterolemia, unspecified: Secondary | ICD-10-CM | POA: Diagnosis not present

## 2020-02-27 DIAGNOSIS — Z5111 Encounter for antineoplastic chemotherapy: Secondary | ICD-10-CM

## 2020-02-27 DIAGNOSIS — T451X5A Adverse effect of antineoplastic and immunosuppressive drugs, initial encounter: Secondary | ICD-10-CM | POA: Diagnosis not present

## 2020-02-27 DIAGNOSIS — D6481 Anemia due to antineoplastic chemotherapy: Secondary | ICD-10-CM | POA: Diagnosis not present

## 2020-02-27 DIAGNOSIS — C3431 Malignant neoplasm of lower lobe, right bronchus or lung: Secondary | ICD-10-CM | POA: Diagnosis not present

## 2020-02-27 DIAGNOSIS — G8929 Other chronic pain: Secondary | ICD-10-CM | POA: Diagnosis not present

## 2020-02-27 LAB — CMP (CANCER CENTER ONLY)
ALT: 11 U/L (ref 0–44)
AST: 27 U/L (ref 15–41)
Albumin: 2.9 g/dL — ABNORMAL LOW (ref 3.5–5.0)
Alkaline Phosphatase: 83 U/L (ref 38–126)
Anion gap: 2 — ABNORMAL LOW (ref 5–15)
BUN: 13 mg/dL (ref 8–23)
CO2: 30 mmol/L (ref 22–32)
Calcium: 9.3 mg/dL (ref 8.9–10.3)
Chloride: 103 mmol/L (ref 98–111)
Creatinine: 1.08 mg/dL — ABNORMAL HIGH (ref 0.44–1.00)
GFR, Est AFR Am: 59 mL/min — ABNORMAL LOW
GFR, Estimated: 51 mL/min — ABNORMAL LOW
Glucose, Bld: 104 mg/dL — ABNORMAL HIGH (ref 70–99)
Potassium: 4.2 mmol/L (ref 3.5–5.1)
Sodium: 135 mmol/L (ref 135–145)
Total Bilirubin: 0.3 mg/dL (ref 0.3–1.2)
Total Protein: 7.2 g/dL (ref 6.5–8.1)

## 2020-02-27 LAB — CBC WITH DIFFERENTIAL (CANCER CENTER ONLY)
Abs Immature Granulocytes: 0.01 10*3/uL (ref 0.00–0.07)
Basophils Absolute: 0 10*3/uL (ref 0.0–0.1)
Basophils Relative: 0 %
Eosinophils Absolute: 0.1 10*3/uL (ref 0.0–0.5)
Eosinophils Relative: 1 %
HCT: 29.8 % — ABNORMAL LOW (ref 36.0–46.0)
Hemoglobin: 9.6 g/dL — ABNORMAL LOW (ref 12.0–15.0)
Immature Granulocytes: 0 %
Lymphocytes Relative: 11 %
Lymphs Abs: 0.6 10*3/uL — ABNORMAL LOW (ref 0.7–4.0)
MCH: 33.1 pg (ref 26.0–34.0)
MCHC: 32.2 g/dL (ref 30.0–36.0)
MCV: 102.8 fL — ABNORMAL HIGH (ref 80.0–100.0)
Monocytes Absolute: 0.5 10*3/uL (ref 0.1–1.0)
Monocytes Relative: 10 %
Neutro Abs: 4 10*3/uL (ref 1.7–7.7)
Neutrophils Relative %: 78 %
Platelet Count: 323 10*3/uL (ref 150–400)
RBC: 2.9 MIL/uL — ABNORMAL LOW (ref 3.87–5.11)
RDW: 15.8 % — ABNORMAL HIGH (ref 11.5–15.5)
WBC Count: 5.2 10*3/uL (ref 4.0–10.5)
nRBC: 0 % (ref 0.0–0.2)

## 2020-02-27 MED ORDER — PROCHLORPERAZINE MALEATE 10 MG PO TABS
10.0000 mg | ORAL_TABLET | Freq: Once | ORAL | Status: AC
  Administered 2020-02-27: 10 mg via ORAL

## 2020-02-27 MED ORDER — SODIUM CHLORIDE 0.9% FLUSH
10.0000 mL | INTRAVENOUS | Status: DC | PRN
  Administered 2020-02-27: 10 mL
  Filled 2020-02-27: qty 10

## 2020-02-27 MED ORDER — PROCHLORPERAZINE MALEATE 10 MG PO TABS
ORAL_TABLET | ORAL | Status: AC
  Filled 2020-02-27: qty 1

## 2020-02-27 MED ORDER — HEPARIN SOD (PORK) LOCK FLUSH 100 UNIT/ML IV SOLN
500.0000 [IU] | Freq: Once | INTRAVENOUS | Status: AC | PRN
  Administered 2020-02-27: 500 [IU]
  Filled 2020-02-27: qty 5

## 2020-02-27 MED ORDER — SODIUM CHLORIDE 0.9 % IV SOLN
Freq: Once | INTRAVENOUS | Status: AC
  Filled 2020-02-27: qty 250

## 2020-02-27 MED ORDER — SODIUM CHLORIDE 0.9 % IV SOLN
500.0000 mg/m2 | Freq: Once | INTRAVENOUS | Status: AC
  Administered 2020-02-27: 900 mg via INTRAVENOUS
  Filled 2020-02-27: qty 20

## 2020-02-27 NOTE — Patient Instructions (Signed)

## 2020-02-27 NOTE — Patient Instructions (Signed)
[facility] Discharge Instructions for Patients Receiving Chemotherapy  Today you received the following chemotherapy agents: pemetrexed.  To help prevent nausea and vomiting after your treatment, we encourage you to take your nausea medication as directed.   If you develop nausea and vomiting that is not controlled by your nausea medication, call the clinic.   BELOW ARE SYMPTOMS THAT SHOULD BE REPORTED IMMEDIATELY:  *FEVER GREATER THAN 100.5 F  *CHILLS WITH OR WITHOUT FEVER  NAUSEA AND VOMITING THAT IS NOT CONTROLLED WITH YOUR NAUSEA MEDICATION  *UNUSUAL SHORTNESS OF BREATH  *UNUSUAL BRUISING OR BLEEDING  TENDERNESS IN MOUTH AND THROAT WITH OR WITHOUT PRESENCE OF ULCERS  *URINARY PROBLEMS  *BOWEL PROBLEMS  UNUSUAL RASH Items with * indicate a potential emergency and should be followed up as soon as possible.  Feel free to call the clinic should you have any questions or concerns. The clinic phone number is [phone].  Please show the [facility] at check-in to the Emergency Department and triage nurse.

## 2020-02-27 NOTE — Progress Notes (Signed)
[place] [phone]   Fax:([phone]) [place], MD [address] [place] [address]  DIAGNOSIS: Stage IV (T1b, N2, M1b) non-small cell lung cancer, adenocarcinoma diagnosed in March 2017 and presented with right lower lobe lung nodule in addition to mediastinal lymphadenopathy and metastatic bone lesions.  Molecular studies: PDL 1 TPS  <1%.   Foundation One Studies: Positive for ERBB2 [id number]. Negative for EGFR, KRAS, ALK, BRAF, MET, RET and ROS1.  PRIOR THERAPY:  1) Induction systemic chemotherapy with carboplatin for AUC of 5 and Alimta 500 MG/M2 every 3 weeks is status post 6 cycles at the [facility], last dose was given 03/05/2016 with stable disease. 2) palliative radiation to the metastatic bone disease in the lower back and pelvic area.  CURRENT THERAPY::  1)  Maintenance systemic chemotherapy with Alimta 500 MG/M2 every 3 weeks status post 68 cycles. First dose was given 03/25/2016.  2) Zometa 4 mg IV every 12 week for metastatic bone disease.  INTERVAL HIST Valerie Long 74 y.o. female returns to the clinic today for follow-up visit.  The patient is feeling fine today with no concerning complaints except for fatigue.  She also has occasional nausea a few days after her treatment.  She denied having any current chest pain but has shortness of breath with exertion with mild cough and no hemoptysis.  She denied having any fever or chills.  She has no current nausea, vomiting, diarrhea or constipation.  She denied having any headache or visual changes.  She has no weight loss or night sweats.  She continues to tolerate her treatment with maintenance chemotherapy fairly well.  She had repeat CT scan of the chest, abdomen pelvis performed recently and she is here for evaluation and discussion of her scan results.   MEDICAL HISTORY: Past Medical History:  Diagnosis Date  .  Adenocarcinoma of right lung, stage 4 ([place]) 2017  . Anemia   . Arthritis   . Bone metastases ([place]) 02/24/2017  . Encounter for antineoplastic chemotherapy 03/17/2016  . Hypercholesterolemia   . Hypertension 06/18/2016  . Osteopenia   . 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 Pneumonia   . Shortness of breath dyspnea     ALLERGIES:  has No Known Allergies.  MEDICATIONS:  Current Outpatient Medications  Medication Sig Dispense Refill  . acetaminophen (TYLENOL) 500 MG tablet Take 1,000 mg by mouth every 6 (six) hours as needed for moderate pain or headache.    . benzonatate (TESSALON) 100 MG capsule Take 1 capsule (100 mg total) by mouth 3 (three) times daily as needed for cough. 60 capsule 0  . budesonide-formoterol (SYMBICORT) 80-4.5 MCG/ACT inhaler Inhale 2 puffs into the lungs 2 (two) times daily. 1 Inhaler 3  . Calcium Carb-Cholecalciferol (CALCIUM 600/VITAMIN D3 PO) Take by mouth.    . Cyanocobalamin (B-12 PO) Take 2 tablets by mouth daily.    Marland Kitchen dexamethasone (DECADRON) 4 MG tablet Take 14m by mouth twice daily the day before, of, and after chemo 40 tablet 1  . Fish Oil-Cholecalciferol (OMEGA-3 + VITAMIN D3 PO) Take 1 tablet by mouth daily.    . folic acid (FOLVITE) 1 MG tablet TAKE 1 TABLET BY MOUTH EVERY DAY 90 tablet 0  . gabapentin (NEURONTIN) 100 MG capsule Take 2 capsules (200 mg total) by mouth 3 (three) times daily. 180 capsule 0  . HYDROcodone-homatropine (HYCODAN)  5-1.5 MG/5ML syrup Take 5 mLs by mouth every 6 (six) hours as needed for cough. 100 mL 0  . ketoconazole (NIZORAL) 2 % cream Apply 1 application topically daily. 30 g 0  . levocetirizine (XYZAL) 5 MG tablet Take 1 tablet (5 mg total) by mouth every evening. 90 tablet 3  . lidocaine-prilocaine (EMLA) cream Apply 1 application topically as needed. Apply 1-2 tsp over port site 1-2 hours prior to chemo. 30 g 0  . meloxicam (MOBIC) 15 MG tablet TAKE 1 TABLET BY MOUTH EVERY DAY AS NEEDED FOR PAIN 30 tablet  0  . ondansetron (ZOFRAN) 8 MG tablet Take 8 mg by mouth every 8 (eight) hours as needed.     Marland Kitchen OVER THE COUNTER MEDICATION Apply 1 drop topically daily as needed. 1 drop CBD oil in vicks vapor rub . Applied to right thigh for pain relief.    Marland Kitchen PRESCRIPTION MEDICATION Inject 1 Dose as directed every 21 ( twenty-one) days. Chemo    . triamcinolone (KENALOG) 0.025 % ointment Apply 1 application topically 3 (three) times daily. 30 g 0  . triamcinolone lotion (KENALOG) 0.1 % Apply 1 application topically 3 (three) times daily. 120 mL 0   No current facility-administered medications for this visit.   Facility-Administered Medications Ordered in Other Visits  Medication Dose Route Frequency Provider Last Rate Last Admin  . sodium chloride flush (NS) 0.9 % injection 10 mL  10 mL Intracatheter PRN Curt Bears, MD   10 mL at 11/15/19 1400    SURGICAL HISTORY:  Past Surgical History:  Procedure Laterality Date  . CESAREAN SECTION     myomectomy  . COLONOSCOPY W/ POLYPECTOMY    . IR GENERIC HISTORICAL  08/17/2016   IR US GUIDE VASC ACCESS RIGHT 08/17/2016 [name], MD [facility]  . IR GENERIC HISTORICAL  08/17/2016   IR FLUORO GUIDE PORT INSERTION RIGHT 08/17/2016 [name], MD [facility]  . MEDIASTINOSCOPY N/A 09/25/2015   Procedure: MEDIASTINOSCOPY;  Surgeon: Melrose Nakayama, MD;  Location: [facility];  Service: Thoracic;  Laterality: N/A;  . VIDEO BRONCHOSCOPY Bilateral 08/19/2015   Procedure: VIDEO BRONCHOSCOPY WITH FLUORO;  Surgeon: Rigoberto Noel, MD;  Location: [facility];  Service: Cardiopulmonary;  Laterality: Bilateral;  . VIDEO BRONCHOSCOPY N/A 09/08/2015   Procedure: VIDEO BRONCHOSCOPY WITH FLUORO;  Surgeon: Rigoberto Noel, MD;  Location: [facility];  Service: Thoracic;  Laterality: N/A;  . VIDEO BRONCHOSCOPY WITH ENDOBRONCHIAL ULTRASOUND Right 09/08/2015   Procedure: ATTEMPTED VIDEO BRONCHOSCOPY ENDOBRONCHIAL ULTRASOUND  ;  Surgeon: Rigoberto Noel, MD;  Location: [facility];   Service: Thoracic;  Laterality: Right;    REVIEW OF SYSTEMS:  Constitutional: positive for fatigue Eyes: negative Ears, nose, mouth, throat, and face: negative Respiratory: positive for dyspnea on exertion Cardiovascular: negative Gastrointestinal: negative Genitourinary:negative Integument/breast: negative Hematologic/lymphatic: negative Musculoskeletal:negative Neurological: negative Behavioral/Psych: negative Endocrine: negative Allergic/Immunologic: negative   PHYSICAL EXAMINATION: General appearance: alert, cooperative, fatigued and no distress Head: Normocephalic, without obvious abnormality, atraumatic Neck: no adenopathy, no JVD, supple, symmetrical, trachea midline and thyroid not enlarged, symmetric, no tenderness/mass/nodules Lymph nodes: Cervical, supraclavicular, and axillary nodes normal. Resp: clear to auscultation bilaterally Back: symmetric, no curvature. ROM normal. No CVA tenderness. Cardio: regular rate and rhythm, S1, S2 normal, no murmur, click, rub or gallop GI: soft, non-tender; bowel sounds normal; no masses,  no organomegaly Extremities: extremities normal, atraumatic, no cyanosis or edema Neurologic: Alert and oriented X 3, normal strength and tone. Normal symmetric reflexes. Normal coordination and gait  ECOG PERFORMANCE STATUS: 1 - Symptomatic but completely ambulatory  Blood pressure 135/82, pulse 99, temperature 98.4 F (36.9 C), resp. rate 18, weight 146 lb 9.6 oz (66.5 kg), last menstrual period 06/09/1998, SpO2 100 %.  LABORATORY DATA: Lab Results  Component Value Date   WBC 5.2 02/27/2020   HGB 9.6 (L) 02/27/2020   HCT 29.8 (L) 02/27/2020   MCV 102.8 (H) 02/27/2020   PLT 323 02/27/2020      Chemistry      Component Value Date/Time   NA 137 02/07/2020 0811   NA 137 06/09/2017 0901   K 3.9 02/07/2020 0811   K 3.9 06/09/2017 0901   CL 103 02/07/2020 0811   CO2 25 02/07/2020 0811   CO2 25 06/09/2017 0901   BUN 15 02/07/2020 0811    BUN 19.0 06/09/2017 0901   CREATININE 0.89 02/07/2020 0811   CREATININE 1.2 (H) 06/09/2017 0901   GLU 170 01/23/2016 0000      Component Value Date/Time   CALCIUM 10.1 02/07/2020 0811   CALCIUM 9.9 06/09/2017 0901   ALKPHOS 75 02/07/2020 0811   ALKPHOS 63 06/09/2017 0901   AST 23 02/07/2020 0811   AST 35 (H) 06/09/2017 0901   ALT 8 02/07/2020 0811   ALT 23 06/09/2017 0901   BILITOT 0.4 02/07/2020 0811   BILITOT 0.65 06/09/2017 0901       RADIOGRAPHIC STUDIES: CT Chest W Contrast  Result Date: 02/25/2020 CLINICAL DATA:  Non-small cell lung cancer, metastatic, assess treatment response, ongoing chemotherapy EXAM: CT CHEST, ABDOMEN, AND PELVIS WITH CONTRAST TECHNIQUE: Multidetector CT imaging of the chest, abdomen and pelvis was performed following the standard protocol during bolus administration of intravenous contrast. CONTRAST:  135m OMNIPAQUE IOHEXOL 300 MG/ML SOLN, additional oral enteric contrast COMPARISON:  01/03/2020 FINDINGS: CT CHEST FINDINGS Cardiovascular: Right chest port catheter. Aortic atherosclerosis. Normal heart size. No pericardial effusion. Mediastinum/Nodes: Unchanged post treatment appearance of ill-defined soft tissue in the mediastinum without discretely visualized lymph nodes (series 4, image 24). Thyroid gland, trachea, and esophagus demonstrate no significant findings. Lungs/Pleura: Unchanged moderate right pleural effusion associated atelectasis or consolidation. Numerous unchanged pulmonary nodules and opacities bilaterally, an index nodule of the superior segment left lower lobe measuring 1.9 x 1.1 cm (series 9, image 65). Unchanged post treatment appearance of a mass of the anterior right lower lobe measuring 3.7 x 3.6 cm (series 9, image 91). Musculoskeletal: No chest wall mass. Numerous sclerotic osseous lesions, unchanged. CT ABDOMEN PELVIS FINDINGS Hepatobiliary: No solid liver abnormality is seen. Gallstones in the contracted gallbladder. No gallbladder  wall thickening, or biliary dilatation. Pancreas: Unremarkable. No pancreatic ductal dilatation or surrounding inflammatory changes. Spleen: Normal in size without significant abnormality. Adrenals/Urinary Tract: Adrenal glands are unremarkable. Malrotated, low lying left kidney. The right kidney is normal. No hydronephrosis. Bladder is unremarkable. Stomach/Bowel: Stomach is within normal limits. Appendix appears normal. No evidence of bowel wall thickening, distention, or inflammatory changes. Vascular/Lymphatic: Aortic atherosclerosis. No enlarged abdominal or pelvic lymph nodes. Reproductive: No mass or other abnormality. Other: No abdominal wall hernia or abnormality. No abdominopelvic ascites. Musculoskeletal: Numerous sclerotic osseous lesions, unchanged. IMPRESSION: 1. Unchanged post treatment appearance of a mass of the anterior right lower lobe. 2. Numerous unchanged pulmonary nodules and opacities bilaterally. 3. Unchanged post treatment appearance of ill-defined soft tissue in the mediastinum without discretely visualized lymph nodes. 4. Unchanged moderate right pleural effusion associated atelectasis or consolidation. 5. Numerous sclerotic osseous metastatic lesions, unchanged. 6. No evidence of metastatic disease within the abdomen or  pelvis. 7. Cholelithiasis. 8. Aortic Atherosclerosis (ICD10-I70.0). Electronically Signed   By: Eddie Candle M.D.   On: 02/25/2020 15:30   CT Abdomen Pelvis W Contrast  Result Date: 02/25/2020 CLINICAL DATA:  Non-small cell lung cancer, metastatic, assess treatment response, ongoing chemotherapy EXAM: CT CHEST, ABDOMEN, AND PELVIS WITH CONTRAST TECHNIQUE: Multidetector CT imaging of the chest, abdomen and pelvis was performed following the standard protocol during bolus administration of intravenous contrast. CONTRAST:  155m OMNIPAQUE IOHEXOL 300 MG/ML SOLN, additional oral enteric contrast COMPARISON:  01/03/2020 FINDINGS: CT CHEST FINDINGS Cardiovascular: Right  chest port catheter. Aortic atherosclerosis. Normal heart size. No pericardial effusion. Mediastinum/Nodes: Unchanged post treatment appearance of ill-defined soft tissue in the mediastinum without discretely visualized lymph nodes (series 4, image 24). Thyroid gland, trachea, and esophagus demonstrate no significant findings. Lungs/Pleura: Unchanged moderate right pleural effusion associated atelectasis or consolidation. Numerous unchanged pulmonary nodules and opacities bilaterally, an index nodule of the superior segment left lower lobe measuring 1.9 x 1.1 cm (series 9, image 65). Unchanged post treatment appearance of a mass of the anterior right lower lobe measuring 3.7 x 3.6 cm (series 9, image 91). Musculoskeletal: No chest wall mass. Numerous sclerotic osseous lesions, unchanged. CT ABDOMEN PELVIS FINDINGS Hepatobiliary: No solid liver abnormality is seen. Gallstones in the contracted gallbladder. No gallbladder wall thickening, or biliary dilatation. Pancreas: Unremarkable. No pancreatic ductal dilatation or surrounding inflammatory changes. Spleen: Normal in size without significant abnormality. Adrenals/Urinary Tract: Adrenal glands are unremarkable. Malrotated, low lying left kidney. The right kidney is normal. No hydronephrosis. Bladder is unremarkable. Stomach/Bowel: Stomach is within normal limits. Appendix appears normal. No evidence of bowel wall thickening, distention, or inflammatory changes. Vascular/Lymphatic: Aortic atherosclerosis. No enlarged abdominal or pelvic lymph nodes. Reproductive: No mass or other abnormality. Other: No abdominal wall hernia or abnormality. No abdominopelvic ascites. Musculoskeletal: Numerous sclerotic osseous lesions, unchanged. IMPRESSION: 1. Unchanged post treatment appearance of a mass of the anterior right lower lobe. 2. Numerous unchanged pulmonary nodules and opacities bilaterally. 3. Unchanged post treatment appearance of ill-defined soft tissue in the  mediastinum without discretely visualized lymph nodes. 4. Unchanged moderate right pleural effusion associated atelectasis or consolidation. 5. Numerous sclerotic osseous metastatic lesions, unchanged. 6. No evidence of metastatic disease within the abdomen or pelvis. 7. Cholelithiasis. 8. Aortic Atherosclerosis (ICD10-I70.0). Electronically Signed   By: AEddie CandleM.D.   On: 02/25/2020 15:30    ASSESSMENT AND PLAN:  This is a very pleasant 74years old Hispanic female with metastatic non-small cell lung cancer, adenocarcinoma status post induction systemic chemotherapy with carboplatin and Alimta and she is currently on maintenance treatment with single agent Alimta status post 68 cycles. The patient has been tolerating her maintenance treatment fairly well with no concerning adverse effect except for fatigue. She had repeat CT scan of the chest, abdomen pelvis performed recently.  I personally and independently reviewed the scans and discussed the results with the patient today. Her scan showed no concerning findings for disease progression. I recommended for the patient to continue her current maintenance treatment and she will proceed with cycle #69 today. For the chronic back pain, she will continue her treatment with Mobic and Lyrica. For the chemotherapy-induced anemia, the patient will continue her current treatment with oral iron tablets. She will come back for follow-up visit in 3 weeks for evaluation before the next cycle of her treatment. The patient was advised to call immediately if she has any concerning symptoms in the interval.  The patient voices understanding of current  disease status and treatment options and is in agreement with the current care plan. All questions were answered. The patient knows to call the clinic with any problems, questions or concerns. We can certainly see the patient much sooner if necessary.  Disclaimer: This note was dictated with voice recognition  software. Similar sounding words can inadvertently be transcribed and may not be corrected upon review.

## 2020-02-27 NOTE — Telephone Encounter (Signed)
LVM o return my call re her missed appt.

## 2020-03-10 ENCOUNTER — Ambulatory Visit: Payer: Medicare Other

## 2020-03-20 ENCOUNTER — Encounter: Payer: Self-pay | Admitting: Internal Medicine

## 2020-03-20 ENCOUNTER — Inpatient Hospital Stay: Payer: Medicare Other

## 2020-03-20 ENCOUNTER — Other Ambulatory Visit: Payer: Self-pay | Admitting: Internal Medicine

## 2020-03-20 ENCOUNTER — Other Ambulatory Visit: Payer: Self-pay

## 2020-03-20 ENCOUNTER — Inpatient Hospital Stay: Payer: Medicare Other | Attending: Internal Medicine | Admitting: Internal Medicine

## 2020-03-20 VITALS — BP 156/76 | HR 88 | Temp 96.2°F | Resp 17 | Ht 63.0 in | Wt 147.6 lb

## 2020-03-20 DIAGNOSIS — Z7951 Long term (current) use of inhaled steroids: Secondary | ICD-10-CM | POA: Insufficient documentation

## 2020-03-20 DIAGNOSIS — Z95828 Presence of other vascular implants and grafts: Secondary | ICD-10-CM

## 2020-03-20 DIAGNOSIS — E7800 Pure hypercholesterolemia, unspecified: Secondary | ICD-10-CM | POA: Diagnosis not present

## 2020-03-20 DIAGNOSIS — Z7952 Long term (current) use of systemic steroids: Secondary | ICD-10-CM | POA: Diagnosis not present

## 2020-03-20 DIAGNOSIS — Z79899 Other long term (current) drug therapy: Secondary | ICD-10-CM | POA: Diagnosis not present

## 2020-03-20 DIAGNOSIS — C7951 Secondary malignant neoplasm of bone: Secondary | ICD-10-CM

## 2020-03-20 DIAGNOSIS — Z5111 Encounter for antineoplastic chemotherapy: Secondary | ICD-10-CM

## 2020-03-20 DIAGNOSIS — I10 Essential (primary) hypertension: Secondary | ICD-10-CM

## 2020-03-20 DIAGNOSIS — C3431 Malignant neoplasm of lower lobe, right bronchus or lung: Secondary | ICD-10-CM | POA: Insufficient documentation

## 2020-03-20 DIAGNOSIS — C3491 Malignant neoplasm of unspecified part of right bronchus or lung: Secondary | ICD-10-CM | POA: Diagnosis not present

## 2020-03-20 DIAGNOSIS — M8580 Other specified disorders of bone density and structure, unspecified site: Secondary | ICD-10-CM | POA: Diagnosis not present

## 2020-03-20 LAB — CMP (CANCER CENTER ONLY)
ALT: 7 U/L (ref 0–44)
AST: 23 U/L (ref 15–41)
Albumin: 2.9 g/dL — ABNORMAL LOW (ref 3.5–5.0)
Alkaline Phosphatase: 75 U/L (ref 38–126)
Anion gap: 6 (ref 5–15)
BUN: 18 mg/dL (ref 8–23)
CO2: 28 mmol/L (ref 22–32)
Calcium: 9.3 mg/dL (ref 8.9–10.3)
Chloride: 104 mmol/L (ref 98–111)
Creatinine: 0.93 mg/dL (ref 0.44–1.00)
GFR, Estimated: >60 mL/min (ref >60)
Glucose, Bld: 119 mg/dL — ABNORMAL HIGH (ref 70–99)
Potassium: 3.6 mmol/L (ref 3.5–5.1)
Sodium: 138 mmol/L (ref 135–145)
Total Bilirubin: 0.4 mg/dL (ref 0.3–1.2)
Total Protein: 7 g/dL (ref 6.5–8.1)

## 2020-03-20 LAB — CBC WITH DIFFERENTIAL (CANCER CENTER ONLY)
Abs Immature Granulocytes: 0.02 10*3/uL (ref 0.00–0.07)
Basophils Absolute: 0 10*3/uL (ref 0.0–0.1)
Basophils Relative: 0 %
Eosinophils Absolute: 0 10*3/uL (ref 0.0–0.5)
Eosinophils Relative: 0 %
HCT: 27.7 % — ABNORMAL LOW (ref 36.0–46.0)
Hemoglobin: 9.1 g/dL — ABNORMAL LOW (ref 12.0–15.0)
Immature Granulocytes: 0 %
Lymphocytes Relative: 12 %
Lymphs Abs: 0.7 10*3/uL (ref 0.7–4.0)
MCH: 32.6 pg (ref 26.0–34.0)
MCHC: 32.9 g/dL (ref 30.0–36.0)
MCV: 99.3 fL (ref 80.0–100.0)
Monocytes Absolute: 0.6 10*3/uL (ref 0.1–1.0)
Monocytes Relative: 11 %
Neutro Abs: 4.5 10*3/uL (ref 1.7–7.7)
Neutrophils Relative %: 77 %
Platelet Count: 338 10*3/uL (ref 150–400)
RBC: 2.79 MIL/uL — ABNORMAL LOW (ref 3.87–5.11)
RDW: 15.3 % (ref 11.5–15.5)
WBC Count: 5.8 10*3/uL (ref 4.0–10.5)
nRBC: 0 % (ref 0.0–0.2)

## 2020-03-20 MED ORDER — ZOLEDRONIC ACID 4 MG/100ML IV SOLN
INTRAVENOUS | Status: AC
  Filled 2020-03-20: qty 100

## 2020-03-20 MED ORDER — CYANOCOBALAMIN 1000 MCG/ML IJ SOLN
1000.0000 ug | Freq: Once | INTRAMUSCULAR | Status: AC
  Administered 2020-03-20: 1000 ug via INTRAMUSCULAR

## 2020-03-20 MED ORDER — ZOLEDRONIC ACID 4 MG/100ML IV SOLN
4.0000 mg | Freq: Once | INTRAVENOUS | Status: AC
  Administered 2020-03-20: 4 mg via INTRAVENOUS

## 2020-03-20 MED ORDER — SODIUM CHLORIDE 0.9% FLUSH
10.0000 mL | Freq: Once | INTRAVENOUS | Status: AC
  Administered 2020-03-20: 10 mL
  Filled 2020-03-20: qty 10

## 2020-03-20 MED ORDER — PROCHLORPERAZINE MALEATE 10 MG PO TABS
10.0000 mg | ORAL_TABLET | Freq: Once | ORAL | Status: AC
  Administered 2020-03-20: 10 mg via ORAL

## 2020-03-20 MED ORDER — SODIUM CHLORIDE 0.9 % IV SOLN
500.0000 mg/m2 | Freq: Once | INTRAVENOUS | Status: AC
  Administered 2020-03-20: 900 mg via INTRAVENOUS
  Filled 2020-03-20: qty 20

## 2020-03-20 MED ORDER — CYANOCOBALAMIN 1000 MCG/ML IJ SOLN
INTRAMUSCULAR | Status: AC
  Filled 2020-03-20: qty 1

## 2020-03-20 MED ORDER — PROCHLORPERAZINE MALEATE 10 MG PO TABS
ORAL_TABLET | ORAL | Status: AC
  Filled 2020-03-20: qty 1

## 2020-03-20 MED ORDER — SODIUM CHLORIDE 0.9 % IV SOLN
Freq: Once | INTRAVENOUS | Status: AC
  Filled 2020-03-20: qty 250

## 2020-03-20 MED ORDER — SODIUM CHLORIDE 0.9% FLUSH
10.0000 mL | INTRAVENOUS | Status: DC | PRN
  Administered 2020-03-20: 10 mL
  Filled 2020-03-20: qty 10

## 2020-03-20 MED ORDER — HEPARIN SOD (PORK) LOCK FLUSH 100 UNIT/ML IV SOLN
500.0000 [IU] | Freq: Once | INTRAVENOUS | Status: AC | PRN
  Administered 2020-03-20: 500 [IU]
  Filled 2020-03-20: qty 5

## 2020-03-20 NOTE — Patient Instructions (Signed)
Valerie Long Discharge Instructions for Patients Receiving Chemotherapy  Today you received the following chemotherapy agents: pemetrexed.  To help prevent nausea and vomiting after your treatment, we encourage you to take your nausea medication as directed.   If you develop nausea and vomiting that is not controlled by your nausea medication, call the clinic.   BELOW ARE SYMPTOMS THAT SHOULD BE REPORTED IMMEDIATELY:  *FEVER GREATER THAN 100.5 F  *CHILLS WITH OR WITHOUT FEVER  NAUSEA AND VOMITING THAT IS NOT CONTROLLED WITH YOUR NAUSEA MEDICATION  *UNUSUAL SHORTNESS OF BREATH  *UNUSUAL BRUISING OR BLEEDING  TENDERNESS IN MOUTH AND THROAT WITH OR WITHOUT PRESENCE OF ULCERS  *URINARY PROBLEMS  *BOWEL PROBLEMS  UNUSUAL RASH Items with * indicate a potential emergency and should be followed up as soon as possible.  Feel free to call the clinic should you have any questions or concerns. The clinic phone number is [phone].  Please show the [facility] at check-in to the Emergency Department and triage nurse.

## 2020-03-20 NOTE — Patient Instructions (Signed)

## 2020-03-20 NOTE — Progress Notes (Signed)
[place] Telephone:([phone]) [phone]   Fax:([phone]) [place], [place] [address] [place] [address]  DIAGNOSIS: Stage IV (T1b, N2, M1b) non-small cell lung cancer, adenocarcinoma diagnosed in March 2017 and presented with right lower lobe lung nodule in addition to mediastinal lymphadenopathy and metastatic bone lesions.  Molecular studies: PDL 1 TPS  <1%.   Foundation One Studies: Positive for ERBB2 [id number]. Negative for EGFR, KRAS, ALK, BRAF, MET, RET and ROS1.  PRIOR THERAPY:  1) Induction systemic chemotherapy with carboplatin for AUC of 5 and Alimta 500 MG/M2 every 3 weeks is status post 6 cycles at the [facility], last dose was given 03/05/2016 with stable disease. 2) palliative radiation to the metastatic bone disease in the lower back and pelvic area.  CURRENT THERAPY::  1)  Maintenance systemic chemotherapy with Alimta 500 MG/M2 every 3 weeks status post 69 cycles. First dose was given 03/25/2016.  2) Zometa 4 mg IV every 12 week for metastatic bone disease.  INTERVAL HIST Valerie Long 74 y.o. female returns to the clinic today for follow-up visit.  The patient is feeling fine today with no concerning complaints except for the fatigue and weakness and aching pain in the legs.  She also has some right ear discomfort but she is planning to see her primary care physician for this.  She denied having any chest pain, shortness of breath, cough or hemoptysis.  She denied having any fever or chills.  She has no nausea, vomiting, diarrhea or constipation.  She has no headache or visual changes.  She is here today for evaluation before starting cycle #7 of her treatment.  MEDICAL HISTORY: Past Medical History:  Diagnosis Date  . Adenocarcinoma of right lung, stage 4 ([place]) 2017  . Anemia   . Arthritis   . Bone metastases ([place]) 02/24/2017  . Encounter for antineoplastic chemotherapy 03/17/2016  .  Hypercholesterolemia   . Hypertension 06/18/2016  . Osteopenia   . 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 Pneumonia   . Shortness of breath dyspnea     ALLERGIES:  has No Known Allergies.  MEDICATIONS:  Current Outpatient Medications  Medication Sig Dispense Refill  . acetaminophen (TYLENOL) 500 MG tablet Take 1,000 mg by mouth every 6 (six) hours as needed for moderate pain or headache.    . benzonatate (TESSALON) 100 MG capsule Take 1 capsule (100 mg total) by mouth 3 (three) times daily as needed for cough. 60 capsule 0  . budesonide-formoterol (SYMBICORT) 80-4.5 MCG/ACT inhaler Inhale 2 puffs into the lungs 2 (two) times daily. (Patient not taking: Reported on 02/27/2020) 1 Inhaler 3  . Calcium Carb-Cholecalciferol (CALCIUM 600/VITAMIN D3 PO) Take by mouth.    . Cyanocobalamin (B-12 PO) Take 2 tablets by mouth daily.    Marland Kitchen dexamethasone (DECADRON) 4 MG tablet Take 46m by mouth twice daily the day before, of, and after chemo 40 tablet 1  . Fish Oil-Cholecalciferol (OMEGA-3 + VITAMIN D3 PO) Take 1 tablet by mouth daily.    . folic acid (FOLVITE) 1 MG tablet TAKE 1 TABLET BY MOUTH EVERY DAY 90 tablet 0  . gabapentin (NEURONTIN) 100 MG capsule Take 2 capsules (200 mg total) by mouth 3 (three) times daily. 180 capsule 0  . HYDROcodone-homatropine (HYCODAN) 5-1.5 MG/5ML syrup Take 5 mLs by mouth every 6 (six) hours as needed for cough. (Patient not taking: Reported  on 02/27/2020) 100 mL 0  . ketoconazole (NIZORAL) 2 % cream Apply 1 application topically daily. 30 g 0  . levocetirizine (XYZAL) 5 MG tablet Take 1 tablet (5 mg total) by mouth every evening. 90 tablet 3  . lidocaine-prilocaine (EMLA) cream Apply 1 application topically as needed. Apply 1-2 tsp over port site 1-2 hours prior to chemo. 30 g 0  . meloxicam (MOBIC) 15 MG tablet TAKE 1 TABLET BY MOUTH EVERY DAY AS NEEDED FOR PAIN 30 tablet 0  . ondansetron (ZOFRAN) 8 MG tablet Take 8 mg by mouth every 8 (eight) hours as  needed.     Marland Kitchen OVER THE COUNTER MEDICATION Apply 1 drop topically daily as needed. 1 drop CBD oil in vicks vapor rub . Applied to right thigh for pain relief.    Marland Kitchen PRESCRIPTION MEDICATION Inject 1 Dose as directed every 21 ( twenty-one) days. Chemo    . triamcinolone (KENALOG) 0.025 % ointment Apply 1 application topically 3 (three) times daily. 30 g 0  . triamcinolone lotion (KENALOG) 0.1 % Apply 1 application topically 3 (three) times daily. 120 mL 0   No current facility-administered medications for this visit.   Facility-Administered Medications Ordered in Other Visits  Medication Dose Route Frequency Provider Last Rate Last Admin  . sodium chloride flush (NS) 0.9 % injection 10 mL  10 mL Intracatheter PRN Curt Bears, MD   10 mL at 11/15/19 1400    SURGICAL HISTORY:  Past Surgical History:  Procedure Laterality Date  . CESAREAN SECTION     myomectomy  . COLONOSCOPY W/ POLYPECTOMY    . IR GENERIC HISTORICAL  08/17/2016   IR US GUIDE VASC ACCESS RIGHT 08/17/2016 [name], MD [facility]  . IR GENERIC HISTORICAL  08/17/2016   IR FLUORO GUIDE PORT INSERTION RIGHT 08/17/2016 [name], MD [facility]  . MEDIASTINOSCOPY N/A 09/25/2015   Procedure: MEDIASTINOSCOPY;  Surgeon: Melrose Nakayama, MD;  Location: [facility];  Service: Thoracic;  Laterality: N/A;  . VIDEO BRONCHOSCOPY Bilateral 08/19/2015   Procedure: VIDEO BRONCHOSCOPY WITH FLUORO;  Surgeon: Rigoberto Noel, MD;  Location: [facility];  Service: Cardiopulmonary;  Laterality: Bilateral;  . VIDEO BRONCHOSCOPY N/A 09/08/2015   Procedure: VIDEO BRONCHOSCOPY WITH FLUORO;  Surgeon: Rigoberto Noel, MD;  Location: [facility];  Service: Thoracic;  Laterality: N/A;  . VIDEO BRONCHOSCOPY WITH ENDOBRONCHIAL ULTRASOUND Right 09/08/2015   Procedure: ATTEMPTED VIDEO BRONCHOSCOPY ENDOBRONCHIAL ULTRASOUND  ;  Surgeon: Rigoberto Noel, MD;  Location: [facility];  Service: Thoracic;  Laterality: Right;    REVIEW OF SYSTEMS:  A comprehensive  review of systems was negative except for: Constitutional: positive for fatigue Musculoskeletal: positive for arthralgias and back pain   PHYSICAL EXAMINATION: General appearance: alert, cooperative, fatigued and no distress Head: Normocephalic, without obvious abnormality, atraumatic Neck: no adenopathy, no JVD, supple, symmetrical, trachea midline and thyroid not enlarged, symmetric, no tenderness/mass/nodules Lymph nodes: Cervical, supraclavicular, and axillary nodes normal. Resp: clear to auscultation bilaterally Back: symmetric, no curvature. ROM normal. No CVA tenderness. Cardio: regular rate and rhythm, S1, S2 normal, no murmur, click, rub or gallop GI: soft, non-tender; bowel sounds normal; no masses,  no organomegaly Extremities: extremities normal, atraumatic, no cyanosis or edema  ECOG PERFORMANCE STATUS: 1 - Symptomatic but completely ambulatory  Blood pressure (!) 156/76, pulse 88, temperature (!) 96.2 F (35.7 C), temperature source Tympanic, resp. rate 17, height _0  (1.6 m), weight 147 lb 9.6 oz (67 kg), last menstrual period 06/09/1998, SpO2 100 %.  LABORATORY DATA: Lab Results  Component Value Date   WBC 5.8 03/20/2020   HGB 9.1 (L) 03/20/2020   HCT 27.7 (L) 03/20/2020   MCV 99.3 03/20/2020   PLT 338 03/20/2020      Chemistry      Component Value Date/Time   NA 138 03/20/2020 0910   NA 137 06/09/2017 0901   K 3.6 03/20/2020 0910   K 3.9 06/09/2017 0901   CL 104 03/20/2020 0910   CO2 28 03/20/2020 0910   CO2 25 06/09/2017 0901   BUN 18 03/20/2020 0910   BUN 19.0 06/09/2017 0901   CREATININE 0.93 03/20/2020 0910   CREATININE 1.2 (H) 06/09/2017 0901   GLU 170 01/23/2016 0000      Component Value Date/Time   CALCIUM 9.3 03/20/2020 0910   CALCIUM 9.9 06/09/2017 0901   ALKPHOS 75 03/20/2020 0910   ALKPHOS 63 06/09/2017 0901   AST 23 03/20/2020 0910   AST 35 (H) 06/09/2017 0901   ALT 7 03/20/2020 0910   ALT 23 06/09/2017 0901   BILITOT 0.4 03/20/2020  0910   BILITOT 0.65 06/09/2017 0901       RADIOGRAPHIC STUDIES: CT Chest W Contrast  Result Date: 02/25/2020 CLINICAL DATA:  Non-small cell lung cancer, metastatic, assess treatment response, ongoing chemotherapy EXAM: CT CHEST, ABDOMEN, AND PELVIS WITH CONTRAST TECHNIQUE: Multidetector CT imaging of the chest, abdomen and pelvis was performed following the standard protocol during bolus administration of intravenous contrast. CONTRAST:  161m OMNIPAQUE IOHEXOL 300 MG/ML SOLN, additional oral enteric contrast COMPARISON:  01/03/2020 FINDINGS: CT CHEST FINDINGS Cardiovascular: Right chest port catheter. Aortic atherosclerosis. Normal heart size. No pericardial effusion. Mediastinum/Nodes: Unchanged post treatment appearance of ill-defined soft tissue in the mediastinum without discretely visualized lymph nodes (series 4, image 24). Thyroid gland, trachea, and esophagus demonstrate no significant findings. Lungs/Pleura: Unchanged moderate right pleural effusion associated atelectasis or consolidation. Numerous unchanged pulmonary nodules and opacities bilaterally, an index nodule of the superior segment left lower lobe measuring 1.9 x 1.1 cm (series 9, image 65). Unchanged post treatment appearance of a mass of the anterior right lower lobe measuring 3.7 x 3.6 cm (series 9, image 91). Musculoskeletal: No chest wall mass. Numerous sclerotic osseous lesions, unchanged. CT ABDOMEN PELVIS FINDINGS Hepatobiliary: No solid liver abnormality is seen. Gallstones in the contracted gallbladder. No gallbladder wall thickening, or biliary dilatation. Pancreas: Unremarkable. No pancreatic ductal dilatation or surrounding inflammatory changes. Spleen: Normal in size without significant abnormality. Adrenals/Urinary Tract: Adrenal glands are unremarkable. Malrotated, low lying left kidney. The right kidney is normal. No hydronephrosis. Bladder is unremarkable. Stomach/Bowel: Stomach is within normal limits. Appendix appears  normal. No evidence of bowel wall thickening, distention, or inflammatory changes. Vascular/Lymphatic: Aortic atherosclerosis. No enlarged abdominal or pelvic lymph nodes. Reproductive: No mass or other abnormality. Other: No abdominal wall hernia or abnormality. No abdominopelvic ascites. Musculoskeletal: Numerous sclerotic osseous lesions, unchanged. IMPRESSION: 1. Unchanged post treatment appearance of a mass of the anterior right lower lobe. 2. Numerous unchanged pulmonary nodules and opacities bilaterally. 3. Unchanged post treatment appearance of ill-defined soft tissue in the mediastinum without discretely visualized lymph nodes. 4. Unchanged moderate right pleural effusion associated atelectasis or consolidation. 5. Numerous sclerotic osseous metastatic lesions, unchanged. 6. No evidence of metastatic disease within the abdomen or pelvis. 7. Cholelithiasis. 8. Aortic Atherosclerosis (ICD10-I70.0). Electronically Signed   By: AEddie CandleM.D.   On: 02/25/2020 15:30   CT Abdomen Pelvis W Contrast  Result Date: 02/25/2020 CLINICAL DATA:  Non-small cell lung cancer,  metastatic, assess treatment response, ongoing chemotherapy EXAM: CT CHEST, ABDOMEN, AND PELVIS WITH CONTRAST TECHNIQUE: Multidetector CT imaging of the chest, abdomen and pelvis was performed following the standard protocol during bolus administration of intravenous contrast. CONTRAST:  168m OMNIPAQUE IOHEXOL 300 MG/ML SOLN, additional oral enteric contrast COMPARISON:  01/03/2020 FINDINGS: CT CHEST FINDINGS Cardiovascular: Right chest port catheter. Aortic atherosclerosis. Normal heart size. No pericardial effusion. Mediastinum/Nodes: Unchanged post treatment appearance of ill-defined soft tissue in the mediastinum without discretely visualized lymph nodes (series 4, image 24). Thyroid gland, trachea, and esophagus demonstrate no significant findings. Lungs/Pleura: Unchanged moderate right pleural effusion associated atelectasis or  consolidation. Numerous unchanged pulmonary nodules and opacities bilaterally, an index nodule of the superior segment left lower lobe measuring 1.9 x 1.1 cm (series 9, image 65). Unchanged post treatment appearance of a mass of the anterior right lower lobe measuring 3.7 x 3.6 cm (series 9, image 91). Musculoskeletal: No chest wall mass. Numerous sclerotic osseous lesions, unchanged. CT ABDOMEN PELVIS FINDINGS Hepatobiliary: No solid liver abnormality is seen. Gallstones in the contracted gallbladder. No gallbladder wall thickening, or biliary dilatation. Pancreas: Unremarkable. No pancreatic ductal dilatation or surrounding inflammatory changes. Spleen: Normal in size without significant abnormality. Adrenals/Urinary Tract: Adrenal glands are unremarkable. Malrotated, low lying left kidney. The right kidney is normal. No hydronephrosis. Bladder is unremarkable. Stomach/Bowel: Stomach is within normal limits. Appendix appears normal. No evidence of bowel wall thickening, distention, or inflammatory changes. Vascular/Lymphatic: Aortic atherosclerosis. No enlarged abdominal or pelvic lymph nodes. Reproductive: No mass or other abnormality. Other: No abdominal wall hernia or abnormality. No abdominopelvic ascites. Musculoskeletal: Numerous sclerotic osseous lesions, unchanged. IMPRESSION: 1. Unchanged post treatment appearance of a mass of the anterior right lower lobe. 2. Numerous unchanged pulmonary nodules and opacities bilaterally. 3. Unchanged post treatment appearance of ill-defined soft tissue in the mediastinum without discretely visualized lymph nodes. 4. Unchanged moderate right pleural effusion associated atelectasis or consolidation. 5. Numerous sclerotic osseous metastatic lesions, unchanged. 6. No evidence of metastatic disease within the abdomen or pelvis. 7. Cholelithiasis. 8. Aortic Atherosclerosis (ICD10-I70.0). Electronically Signed   By: AEddie CandleM.D.   On: 02/25/2020 15:30    ASSESSMENT AND  PLAN:  This is a very pleasant 74years old Hispanic female with metastatic non-small cell lung cancer, adenocarcinoma status post induction systemic chemotherapy with carboplatin and Alimta and she is currently on maintenance treatment with single agent Alimta status post 69 cycles. The patient continues to tolerate her treatment with Alimta fairly well except for fatigue. I recommended for her to proceed with cycle #70 today as planned. For the chronic back pain, she will continue her treatment with Mobic and Lyrica. For the chemotherapy-induced anemia, she has not been taking her iron tablet at regular basis recently.  I recommended for the patient to resume her oral iron tablets. She will come back for follow-up visit in 3 weeks for evaluation before the next cycle of her treatment. She was advised to call immediately if she has any concerning symptoms in the interval. The patient voices understanding of current disease status and treatment options and is in agreement with the current care plan. All questions were answered. The patient knows to call the clinic with any problems, questions or concerns. We can certainly see the patient much sooner if necessary.  Disclaimer: This note was dictated with voice recognition software. Similar sounding words can inadvertently be transcribed and may not be corrected upon review.

## 2020-03-28 ENCOUNTER — Other Ambulatory Visit: Payer: Self-pay

## 2020-03-28 ENCOUNTER — Ambulatory Visit
Admission: RE | Admit: 2020-03-28 | Discharge: 2020-03-28 | Disposition: A | Discharge status: Home / Self Care | Payer: Medicare Other | Source: Ambulatory Visit | Attending: Internal Medicine | Admitting: Internal Medicine

## 2020-03-28 DIAGNOSIS — Z5111 Encounter for antineoplastic chemotherapy: Secondary | ICD-10-CM

## 2020-03-28 DIAGNOSIS — Z1231 Encounter for screening mammogram for malignant neoplasm of breast: Secondary | ICD-10-CM | POA: Diagnosis not present

## 2020-03-28 DIAGNOSIS — C3491 Malignant neoplasm of unspecified part of right bronchus or lung: Secondary | ICD-10-CM

## 2020-03-28 DIAGNOSIS — Z0000 Encounter for general adult medical examination without abnormal findings: Secondary | ICD-10-CM

## 2020-03-28 DIAGNOSIS — C3490 Malignant neoplasm of unspecified part of unspecified bronchus or lung: Secondary | ICD-10-CM

## 2020-03-28 MED ORDER — FOLIC ACID 1 MG PO TABS: 1.0000 mg | ORAL_TABLET | Freq: Every day | ORAL | 0 refills | Status: DC

## 2020-04-09 ENCOUNTER — Other Ambulatory Visit: Payer: Self-pay

## 2020-04-09 DIAGNOSIS — C3491 Malignant neoplasm of unspecified part of right bronchus or lung: Secondary | ICD-10-CM

## 2020-04-10 ENCOUNTER — Inpatient Hospital Stay: Payer: Medicare Other

## 2020-04-10 ENCOUNTER — Inpatient Hospital Stay: Payer: Medicare Other | Attending: Internal Medicine | Admitting: Internal Medicine

## 2020-04-10 ENCOUNTER — Other Ambulatory Visit: Payer: Self-pay

## 2020-04-10 ENCOUNTER — Encounter: Payer: Self-pay | Admitting: Internal Medicine

## 2020-04-10 VITALS — BP 143/66 | HR 89 | Temp 97.5°F | Resp 18 | Ht 63.0 in | Wt 143.5 lb

## 2020-04-10 DIAGNOSIS — C3491 Malignant neoplasm of unspecified part of right bronchus or lung: Secondary | ICD-10-CM

## 2020-04-10 DIAGNOSIS — E7800 Pure hypercholesterolemia, unspecified: Secondary | ICD-10-CM | POA: Insufficient documentation

## 2020-04-10 DIAGNOSIS — C3431 Malignant neoplasm of lower lobe, right bronchus or lung: Secondary | ICD-10-CM | POA: Insufficient documentation

## 2020-04-10 DIAGNOSIS — Z79899 Other long term (current) drug therapy: Secondary | ICD-10-CM | POA: Insufficient documentation

## 2020-04-10 DIAGNOSIS — M8580 Other specified disorders of bone density and structure, unspecified site: Secondary | ICD-10-CM | POA: Insufficient documentation

## 2020-04-10 DIAGNOSIS — Z5111 Encounter for antineoplastic chemotherapy: Secondary | ICD-10-CM | POA: Diagnosis not present

## 2020-04-10 DIAGNOSIS — C7951 Secondary malignant neoplasm of bone: Secondary | ICD-10-CM | POA: Diagnosis not present

## 2020-04-10 DIAGNOSIS — Z95828 Presence of other vascular implants and grafts: Secondary | ICD-10-CM

## 2020-04-10 DIAGNOSIS — Z7951 Long term (current) use of inhaled steroids: Secondary | ICD-10-CM | POA: Insufficient documentation

## 2020-04-10 DIAGNOSIS — Z7952 Long term (current) use of systemic steroids: Secondary | ICD-10-CM | POA: Diagnosis not present

## 2020-04-10 DIAGNOSIS — G8929 Other chronic pain: Secondary | ICD-10-CM | POA: Diagnosis not present

## 2020-04-10 DIAGNOSIS — I10 Essential (primary) hypertension: Secondary | ICD-10-CM | POA: Diagnosis not present

## 2020-04-10 LAB — CMP (CANCER CENTER ONLY)
ALT: 10 U/L (ref 0–44)
AST: 25 U/L (ref 15–41)
Albumin: 3 g/dL — ABNORMAL LOW (ref 3.5–5.0)
Alkaline Phosphatase: 85 U/L (ref 38–126)
Anion gap: 9 (ref 5–15)
BUN: 13 mg/dL (ref 8–23)
CO2: 25 mmol/L (ref 22–32)
Calcium: 9.4 mg/dL (ref 8.9–10.3)
Chloride: 104 mmol/L (ref 98–111)
Creatinine: 0.95 mg/dL (ref 0.44–1.00)
GFR, Estimated: >60 mL/min (ref >60)
Glucose, Bld: 118 mg/dL — ABNORMAL HIGH (ref 70–99)
Potassium: 3.9 mmol/L (ref 3.5–5.1)
Sodium: 138 mmol/L (ref 135–145)
Total Bilirubin: 0.4 mg/dL (ref 0.3–1.2)
Total Protein: 7.4 g/dL (ref 6.5–8.1)

## 2020-04-10 LAB — CBC WITH DIFFERENTIAL (CANCER CENTER ONLY)
Abs Immature Granulocytes: 0.02 10*3/uL (ref 0.00–0.07)
Basophils Absolute: 0 10*3/uL (ref 0.0–0.1)
Basophils Relative: 0 %
Eosinophils Absolute: 0 10*3/uL (ref 0.0–0.5)
Eosinophils Relative: 0 %
HCT: 29.6 % — ABNORMAL LOW (ref 36.0–46.0)
Hemoglobin: 9.6 g/dL — ABNORMAL LOW (ref 12.0–15.0)
Immature Granulocytes: 0 %
Lymphocytes Relative: 12 %
Lymphs Abs: 0.7 10*3/uL (ref 0.7–4.0)
MCH: 32.4 pg (ref 26.0–34.0)
MCHC: 32.4 g/dL (ref 30.0–36.0)
MCV: 100 fL (ref 80.0–100.0)
Monocytes Absolute: 0.7 10*3/uL (ref 0.1–1.0)
Monocytes Relative: 12 %
Neutro Abs: 4.1 10*3/uL (ref 1.7–7.7)
Neutrophils Relative %: 76 %
Platelet Count: 347 10*3/uL (ref 150–400)
RBC: 2.96 MIL/uL — ABNORMAL LOW (ref 3.87–5.11)
RDW: 15.2 % (ref 11.5–15.5)
WBC Count: 5.5 10*3/uL (ref 4.0–10.5)
nRBC: 0 % (ref 0.0–0.2)

## 2020-04-10 MED ORDER — SODIUM CHLORIDE 0.9 % IV SOLN
500.0000 mg/m2 | Freq: Once | INTRAVENOUS | Status: AC
  Administered 2020-04-10: 900 mg via INTRAVENOUS
  Filled 2020-04-10: qty 16

## 2020-04-10 MED ORDER — SODIUM CHLORIDE 0.9% FLUSH
10.0000 mL | INTRAVENOUS | Status: DC | PRN
  Administered 2020-04-10: 10 mL via INTRAVENOUS
  Filled 2020-04-10: qty 10

## 2020-04-10 MED ORDER — SODIUM CHLORIDE 0.9% FLUSH
10.0000 mL | INTRAVENOUS | Status: DC | PRN
  Administered 2020-04-10: 10 mL
  Filled 2020-04-10: qty 10

## 2020-04-10 MED ORDER — PROCHLORPERAZINE MALEATE 10 MG PO TABS
ORAL_TABLET | ORAL | Status: AC
  Filled 2020-04-10: qty 1

## 2020-04-10 MED ORDER — PROCHLORPERAZINE MALEATE 10 MG PO TABS
10.0000 mg | ORAL_TABLET | Freq: Once | ORAL | Status: AC
  Administered 2020-04-10: 10 mg via ORAL

## 2020-04-10 MED ORDER — HEPARIN SOD (PORK) LOCK FLUSH 100 UNIT/ML IV SOLN
500.0000 [IU] | Freq: Once | INTRAVENOUS | Status: AC | PRN
  Administered 2020-04-10: 500 [IU]
  Filled 2020-04-10: qty 5

## 2020-04-10 MED ORDER — SODIUM CHLORIDE 0.9 % IV SOLN
Freq: Once | INTRAVENOUS | Status: AC
  Filled 2020-04-10: qty 250

## 2020-04-10 NOTE — Patient Instructions (Signed)
[facility] Discharge Instructions for Patients Receiving Chemotherapy  Today you received the following chemotherapy agents: Alimta.  To help prevent nausea and vomiting after your treatment, we encourage you to take your nausea medication as directed.   If you develop nausea and vomiting that is not controlled by your nausea medication, call the clinic.   BELOW ARE SYMPTOMS THAT SHOULD BE REPORTED IMMEDIATELY:  *FEVER GREATER THAN 100.5 F  *CHILLS WITH OR WITHOUT FEVER  NAUSEA AND VOMITING THAT IS NOT CONTROLLED WITH YOUR NAUSEA MEDICATION  *UNUSUAL SHORTNESS OF BREATH  *UNUSUAL BRUISING OR BLEEDING  TENDERNESS IN MOUTH AND THROAT WITH OR WITHOUT PRESENCE OF ULCERS  *URINARY PROBLEMS  *BOWEL PROBLEMS  UNUSUAL RASH Items with * indicate a potential emergency and should be followed up as soon as possible.  Feel free to call the clinic should you have any questions or concerns. The clinic phone number is [phone].  Please show the [facility] at check-in to the Emergency Department and triage nurse.

## 2020-04-10 NOTE — Progress Notes (Signed)
[place] [phone]   Fax:([phone] [place], MD [address] [place] [address]  DIAGNOSIS: Stage IV (T1b, N2, M1b) non-small cell lung cancer, adenocarcinoma diagnosed in March 2017 and presented with right lower lobe lung nodule in addition to mediastinal lymphadenopathy and metastatic bone lesions.  Molecular studies: PDL 1 TPS  <1%.   Foundation One Studies: Positive for ERBB2 [id number]. Negative for EGFR, KRAS, ALK, BRAF, MET, RET and ROS1.  PRIOR THERAPY:  1) Induction systemic chemotherapy with carboplatin for AUC of 5 and Alimta 500 MG/M2 every 3 weeks is status post 6 cycles at the [facility], last dose was given 03/05/2016 with stable disease. 2) palliative radiation to the metastatic bone disease in the lower back and pelvic area.  CURRENT THERAPY::  1)  Maintenance systemic chemotherapy with Alimta 500 MG/M2 every 3 weeks status post 70 cycles. First dose was given 03/25/2016.  2) Zometa 4 mg IV every 12 week for metastatic bone disease.  INTERVAL HIST Valerie Long 74 y.o. female returns to the clinic today for follow-up visit.  The patient is feeling fine except for the low back pain with radiation to the right leg.  She denied having any chest pain, shortness of breath but has cough with no hemoptysis.  She is using Best boy for her cough at nighttime.  She denied having any nausea, vomiting, diarrhea or constipation.  She denied having any headache or visual changes.  The patient is here today for evaluation before starting cycle #71.  MEDICAL HISTORY: Past Medical History:  Diagnosis Date  . Adenocarcinoma of right lung, stage 4 ([place]) 2017  . Anemia   . Arthritis   . Bone metastases ([place]) 02/24/2017  . Encounter for antineoplastic chemotherapy 03/17/2016  . Hypercholesterolemia   . Hypertension 06/18/2016  . Osteopenia   . Pelvic kidney    Left. On CT  in [place]  . Pneumonia   . Shortness of breath dyspnea     ALLERGIES:  has No Known Allergies.  MEDICATIONS:  Current Outpatient Medications  Medication Sig Dispense Refill  . acetaminophen (TYLENOL) 500 MG tablet Take 1,000 mg by mouth every 6 (six) hours as needed for moderate pain or headache.    . benzonatate (TESSALON) 100 MG capsule Take 1 capsule (100 mg total) by mouth 3 (three) times daily as needed for cough. 60 capsule 0  . budesonide-formoterol (SYMBICORT) 80-4.5 MCG/ACT inhaler Inhale 2 puffs into the lungs 2 (two) times daily. 1 Inhaler 3  . Calcium Carb-Cholecalciferol (CALCIUM 600/VITAMIN D3 PO) Take by mouth.    . Cyanocobalamin (B-12 PO) Take 2 tablets by mouth daily.    Marland Kitchen dexamethasone (DECADRON) 4 MG tablet Take 54m by mouth twice daily the day before, of, and after chemo 40 tablet 1  . Fish Oil-Cholecalciferol (OMEGA-3 + VITAMIN D3 PO) Take 1 tablet by mouth daily.    . folic acid (FOLVITE) 1 MG tablet Take 1 tablet (1 mg total) by mouth daily. 90 tablet 0  . gabapentin (NEURONTIN) 100 MG capsule Take 2 capsules (200 mg total) by mouth 3 (three) times daily. 180 capsule 0  . HYDROcodone-homatropine (HYCODAN) 5-1.5 MG/5ML syrup Take 5 mLs by mouth every 6 (six) hours as needed for cough. 100 mL 0  . ketoconazole (NIZORAL) 2 % cream Apply 1 application topically daily. 30 g 0  . levocetirizine (XYZAL) 5 MG tablet Take 1  tablet (5 mg total) by mouth every evening. 90 tablet 3  . lidocaine-prilocaine (EMLA) cream Apply 1 application topically as needed. Apply 1-2 tsp over port site 1-2 hours prior to chemo. 30 g 0  . meloxicam (MOBIC) 15 MG tablet TAKE 1 TABLET BY MOUTH EVERY DAY AS NEEDED FOR PAIN 30 tablet 0  . ondansetron (ZOFRAN) 8 MG tablet Take 8 mg by mouth every 8 (eight) hours as needed.     Marland Kitchen OVER THE COUNTER MEDICATION Apply 1 drop topically daily as needed. 1 drop CBD oil in vicks vapor rub . Applied to right thigh for pain relief.    Marland Kitchen PRESCRIPTION  MEDICATION Inject 1 Dose as directed every 21 ( twenty-one) days. Chemo    . triamcinolone (KENALOG) 0.025 % ointment Apply 1 application topically 3 (three) times daily. 30 g 0  . triamcinolone lotion (KENALOG) 0.1 % Apply 1 application topically 3 (three) times daily. 120 mL 0   No current facility-administered medications for this visit.   Facility-Administered Medications Ordered in Other Visits  Medication Dose Route Frequency Provider Last Rate Last Admin  . sodium chloride flush (NS) 0.9 % injection 10 mL  10 mL Intracatheter PRN Curt Bears, MD   10 mL at 11/15/19 1400    SURGICAL HISTORY:  Past Surgical History:  Procedure Laterality Date  . CESAREAN SECTION     myomectomy  . COLONOSCOPY W/ POLYPECTOMY    . IR GENERIC HISTORICAL  08/17/2016   IR US GUIDE VASC ACCESS RIGHT 08/17/2016 [name], MD [facility]  . IR GENERIC HISTORICAL  08/17/2016   IR FLUORO GUIDE PORT INSERTION RIGHT 08/17/2016 [name], MD [facility]  . MEDIASTINOSCOPY N/A 09/25/2015   Procedure: MEDIASTINOSCOPY;  Surgeon: Melrose Nakayama, MD;  Location: [place];  Service: Thoracic;  Laterality: N/A;  . VIDEO BRONCHOSCOPY Bilateral 08/19/2015   Procedure: VIDEO BRONCHOSCOPY WITH FLUORO;  Surgeon: Rigoberto Noel, MD;  Location: [facility];  Service: Cardiopulmonary;  Laterality: Bilateral;  . VIDEO BRONCHOSCOPY N/A 09/08/2015   Procedure: VIDEO BRONCHOSCOPY WITH FLUORO;  Surgeon: Rigoberto Noel, MD;  Location: [facility];  Service: Thoracic;  Laterality: N/A;  . VIDEO BRONCHOSCOPY WITH ENDOBRONCHIAL ULTRASOUND Right 09/08/2015   Procedure: ATTEMPTED VIDEO BRONCHOSCOPY ENDOBRONCHIAL ULTRASOUND  ;  Surgeon: Rigoberto Noel, MD;  Location: [facility];  Service: Thoracic;  Laterality: Right;    REVIEW OF SYSTEMS:  A comprehensive review of systems was negative except for: Constitutional: positive for fatigue Respiratory: positive for cough Musculoskeletal: positive for arthralgias and back pain   PHYSICAL  EXAMINATION: General appearance: alert, cooperative, fatigued and no distress Head: Normocephalic, without obvious abnormality, atraumatic Neck: no adenopathy, no JVD, supple, symmetrical, trachea midline and thyroid not enlarged, symmetric, no tenderness/mass/nodules Lymph nodes: Cervical, supraclavicular, and axillary nodes normal. Resp: clear to auscultation bilaterally Back: symmetric, no curvature. ROM normal. No CVA tenderness. Cardio: regular rate and rhythm, S1, S2 normal, no murmur, click, rub or gallop GI: soft, non-tender; bowel sounds normal; no masses,  no organomegaly Extremities: extremities normal, atraumatic, no cyanosis or edema  ECOG PERFORMANCE STATUS: 1 - Symptomatic but completely ambulatory  Blood pressure (!) 143/66, pulse 89, temperature (!) 97.5 F (36.4 C), temperature source Tympanic, resp. rate 18, height 5' 3" (1.6 m), weight 143 lb 8 oz (65.1 kg), last menstrual period 06/09/1998, SpO2 100 %.  LABORATORY DATA: Lab Results  Component Value Date   WBC 5.5 04/10/2020   HGB 9.6 (L) 04/10/2020   HCT 29.6 (L) 04/10/2020  MCV 100.0 04/10/2020   PLT 347 04/10/2020      Chemistry      Component Value Date/Time   NA 138 04/10/2020 0912   NA 137 06/09/2017 0901   K 3.9 04/10/2020 0912   K 3.9 06/09/2017 0901   CL 104 04/10/2020 0912   CO2 25 04/10/2020 0912   CO2 25 06/09/2017 0901   BUN 13 04/10/2020 0912   BUN 19.0 06/09/2017 0901   CREATININE 0.95 04/10/2020 0912   CREATININE 1.2 (H) 06/09/2017 0901   GLU 170 01/23/2016 0000      Component Value Date/Time   CALCIUM 9.4 04/10/2020 0912   CALCIUM 9.9 06/09/2017 0901   ALKPHOS 85 04/10/2020 0912   ALKPHOS 63 06/09/2017 0901   AST 25 04/10/2020 0912   AST 35 (H) 06/09/2017 0901   ALT 10 04/10/2020 0912   ALT 23 06/09/2017 0901   BILITOT 0.4 04/10/2020 0912   BILITOT 0.65 06/09/2017 0901       RADIOGRAPHIC STUDIES: MM 3D SCREEN BREAST BILATERAL  Result Date: 04/01/2020 CLINICAL DATA:   Screening. EXAM: DIGITAL SCREENING BILATERAL MAMMOGRAM WITH TOMO AND CAD COMPARISON:  Previous exam(s). ACR Breast Density Category b: There are scattered areas of fibroglandular density. FINDINGS: There are no findings suspicious for malignancy. Images were processed with CAD. IMPRESSION: No mammographic evidence of malignancy. A result letter of this screening mammogram will be mailed directly to the patient. RECOMMENDATION: Screening mammogram in one year. (Code:SM-B-01Y) BI-RADS CATEGORY  1: Negative. Electronically Signed   By: Franki Cabot M.D.   On: 04/01/2020 10:45    ASSESSMENT AND PLAN:  This is a very pleasant 74 years old Hispanic female with metastatic non-small cell lung cancer, adenocarcinoma diagnosed in March 2017 status post induction systemic chemotherapy with carboplatin and Alimta and she is currently on maintenance treatment with single agent Alimta status post 70 cycles. The patient continues to tolerate her treatment well except for the fatigue. I recommended for her to proceed with cycle #71 today as planned. For the chronic back pain, she will continue her treatment with Mobic and Lyrica. For the chemotherapy-induced anemia, she has not been taking her iron tablet at regular basis recently.  I recommended for the patient to resume her oral iron tablets. She will come back for follow-up visit in 3 weeks for evaluation before the next cycle of her treatment. The patient was advised to call immediately if she has any concerning symptoms in the interval. The patient voices understanding of current disease status and treatment options and is in agreement with the current care plan. All questions were answered. The patient knows to call the clinic with any problems, questions or concerns. We can certainly see the patient much sooner if necessary.  Disclaimer: This note was dictated with voice recognition software. Similar sounding words can inadvertently be transcribed and may not  be corrected upon review.

## 2020-04-15 ENCOUNTER — Ambulatory Visit: Payer: Medicare Other | Admitting: Internal Medicine

## 2020-04-16 ENCOUNTER — Other Ambulatory Visit: Payer: Self-pay | Admitting: Physician Assistant

## 2020-04-16 ENCOUNTER — Telehealth: Payer: Self-pay | Admitting: Internal Medicine

## 2020-04-16 DIAGNOSIS — C3491 Malignant neoplasm of unspecified part of right bronchus or lung: Secondary | ICD-10-CM

## 2020-04-16 DIAGNOSIS — R059 Cough, unspecified: Secondary | ICD-10-CM

## 2020-04-16 NOTE — Telephone Encounter (Signed)
Prescriptions sent

## 2020-04-16 NOTE — Telephone Encounter (Signed)
Refill request for benzonatate (no longer on med list) and ketoconazole 2% cream. Please advise.

## 2020-04-18 ENCOUNTER — Telehealth: Payer: Self-pay

## 2020-04-18 ENCOUNTER — Other Ambulatory Visit: Payer: Self-pay | Admitting: Physician Assistant

## 2020-04-18 DIAGNOSIS — C3491 Malignant neoplasm of unspecified part of right bronchus or lung: Secondary | ICD-10-CM

## 2020-04-18 DIAGNOSIS — C7951 Secondary malignant neoplasm of bone: Secondary | ICD-10-CM

## 2020-04-18 MED ORDER — DEXAMETHASONE 4 MG PO TABS: ORAL_TABLET | ORAL | 1 refills | Status: DC

## 2020-04-18 MED ORDER — LIDOCAINE-PRILOCAINE 2.5-2.5 % EX CREA: 1.0000 "application " | TOPICAL_CREAM | CUTANEOUS | 2 refills | Status: DC | PRN

## 2020-04-18 NOTE — Telephone Encounter (Signed)
Pt LM requesting refills but did not specify what medication.  I called tp back and she advised Decadron, EMLA Cream and Tessalon.  Pt was advised her Tessalon was already submitted and Cassie PA-C advised she will sent the EMLA and Decadron today. Pt was advised and expressed understanding of this information.

## 2020-04-18 NOTE — Progress Notes (Addendum)
[facility] [address], [place], [place] [address] [phone]  Date:  04/21/2020   Name:  Valerie Long   DOB:  04-10-1946   MRN:  [id number]  PCP:  Colon Branch, MD    Chief Complaint: Abdominal Pain   History of Present Illness:  Valerie Long is a 73 y.o. very pleasant female patient who presents with the following:  Primary patient of my partner Dr. Larose Kells, here today with concern of abdominal pain I have not seen her myself in the past Unfortunately, she has advanced lung cancer-her oncologist is Dr. Julien Nordmann.  Most recent visit earlier this month DIAGNOSIS: Stage IV (T1b, N2, M1b) non-small cell lung cancer, adenocarcinoma diagnosed in March 2017 and presented with right lower lobe lung nodule in addition to mediastinal lymphadenopathy and metastatic bone lesions She is currently on maintenance treatment with Alimta  CBC, CMP on chart from 11/4 Flu vaccine- not done this year yet. She is actively on chemo covid vaccines done- 2nd dose May 30th   Pt notes that she had a chemo treatment on 11/4.  She felt fine for a few days after, but Monday (a week ago) she felt feverish.  She checked her temperature and noted that she was running low-grade fevers.  She was 99.8 last night T-max 101.1 this last Thursday She notes a cough but this is stable- she would like some cough syrup to assist with sleep if possible She is using Tylenol for her fevers She has noted perhaps some mild pain in the LUQ-she was not sure if this might be related No urinary sx  No sore throat No new body aches  She did tell DR Julien Nordmann about her fevers- she was told if higher than 101.5 to seek care   Her last dose of tylenol was last night   Here today with her sister Per patient report, tachycardia is not unusual for her  Pulse Readings from Last 3 Encounters:  04/21/20 (!) 114  04/10/20 89  03/20/20 88   11/23/2019  11/23/2019   1   Hydrocodone-Homatropine Soln 100.00  5  Ed Sag  [id number]  [place] ([id number])  0/0  20.00 MME  Comm Ins  [place]    05/16/2019  05/16/2019   1  Pregabalin 75 Mg Capsule 60.00  [place]            Patient Active Problem List   Diagnosis Date Noted  . Pleural effusion on right 12/06/2019  . Port-A-Cath in place 01/12/2018  . Bone metastases ([place]) 02/24/2017  . 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 Hypertension 06/18/2016  . Encounter for antineoplastic chemotherapy 03/17/2016  . Adenocarcinoma of right lung, stage 4 ([place])   . PCP NOTES >>> 03/22/2015  . CTS (carpal tunnel syndrome) 12/06/2013  . Annual physical exam 08/29/2012  . Pain in joint, shoulder region 08/29/2012  . Vaginal atrophy 06/17/2011  . Osteopenia   . Hypercholesterolemia     Past Medical History:  Diagnosis Date  . Adenocarcinoma of right lung, stage 4 ([place]) 2017  . Anemia   . Arthritis   . Bone metastases ([place]) 02/24/2017  . Encounter for antineoplastic chemotherapy 03/17/2016  . Hypercholesterolemia   . Hypertension 06/18/2016  . Osteopenia   . 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 Pneumonia   . Shortness of breath dyspnea     Past Surgical History:  Procedure  Laterality Date  . CESAREAN SECTION     myomectomy  . COLONOSCOPY W/ POLYPECTOMY    . IR GENERIC HISTORICAL  08/17/2016   IR US GUIDE VASC ACCESS RIGHT 08/17/2016 [name], MD [facility]  . IR GENERIC HISTORICAL  08/17/2016   IR FLUORO GUIDE PORT INSERTION RIGHT 08/17/2016 [name], MD [facility]  . MEDIASTINOSCOPY N/A 09/25/2015   Procedure: MEDIASTINOSCOPY;  Surgeon: Melrose Nakayama, MD;  Location: [facility];  Service: Thoracic;  Laterality: N/A;  . VIDEO BRONCHOSCOPY Bilateral 08/19/2015   Procedure: VIDEO BRONCHOSCOPY WITH FLUORO;  Surgeon: Rigoberto Noel, MD;  Location: [facility];  Service: Cardiopulmonary;  Laterality: Bilateral;  . VIDEO BRONCHOSCOPY N/A 09/08/2015   Procedure: VIDEO BRONCHOSCOPY  WITH FLUORO;  Surgeon: Rigoberto Noel, MD;  Location: [facility];  Service: Thoracic;  Laterality: N/A;  . VIDEO BRONCHOSCOPY WITH ENDOBRONCHIAL ULTRASOUND Right 09/08/2015   Procedure: ATTEMPTED VIDEO BRONCHOSCOPY ENDOBRONCHIAL ULTRASOUND  ;  Surgeon: Rigoberto Noel, MD;  Location: [facility];  Service: Thoracic;  Laterality: Right;    Social History   Tobacco Use  . Smoking status: Never Smoker  . Smokeless tobacco: Never Used  Vaping Use  . Vaping Use: Never used  Substance Use Topics  . Alcohol use: Yes    Alcohol/week: 0.0 standard drinks    Comment: SOCIAL  . Drug use: No    Family History  Problem Relation Age of Onset  . Hypertension Mother        F and M  . CAD Father   . Diabetes Other        auncle-aunts   . Cancer Other        UTERINE   . Breast cancer Sister   . Stroke Neg Hx   . Colon cancer Neg Hx     No Known Allergies  Medication list has been reviewed and updated.  Current Outpatient Medications on File Prior to Visit  Medication Sig Dispense Refill  . acetaminophen (TYLENOL) 500 MG tablet Take 1,000 mg by mouth every 6 (six) hours as needed for moderate pain or headache.    . benzonatate (TESSALON) 100 MG capsule TAKE 1 CAPSULE BY MOUTH THREE TIMES A DAY AS NEEDED FOR COUGH 60 capsule 0  . benzonatate (TESSALON) 200 MG capsule TAKE 1 CAPSULE BY MOUTH THREE TIMES A DAY AS NEEDED FOR COUGH 40 capsule 0  . budesonide-formoterol (SYMBICORT) 80-4.5 MCG/ACT inhaler Inhale 2 puffs into the lungs 2 (two) times daily. 1 Inhaler 3  . Calcium Carb-Cholecalciferol (CALCIUM 600/VITAMIN D3 PO) Take by mouth.    . Cyanocobalamin (B-12 PO) Take 2 tablets by mouth daily.    Marland Kitchen dexamethasone (DECADRON) 4 MG tablet Take 4mg  by mouth twice daily the day before, of, and after chemo 40 tablet 1  . Fish Oil-Cholecalciferol (OMEGA-3 + VITAMIN D3 PO) Take 1 tablet by mouth daily.    . folic acid (FOLVITE) 1 MG tablet Take 1 tablet (1 mg total) by mouth daily. 90 tablet 0  . gabapentin  (NEURONTIN) 100 MG capsule Take 2 capsules (200 mg total) by mouth 3 (three) times daily. 180 capsule 0  . HYDROcodone-homatropine (HYCODAN) 5-1.5 MG/5ML syrup Take 5 mLs by mouth every 6 (six) hours as needed for cough. 100 mL 0  . ketoconazole (NIZORAL) 2 % cream APPLY TO AFFECTED AREA EVERY DAY 30 g 0  . levocetirizine (XYZAL) 5 MG tablet Take 1 tablet (5 mg total) by mouth every evening. 90 tablet 3  . lidocaine-prilocaine (  EMLA) cream Apply 1 application topically as needed. Apply 1-2 tsp over port site 1-2 hours prior to chemo. 30 g 2  . meloxicam (MOBIC) 15 MG tablet TAKE 1 TABLET BY MOUTH EVERY DAY AS NEEDED FOR PAIN 30 tablet 0  . ondansetron (ZOFRAN) 8 MG tablet Take 8 mg by mouth every 8 (eight) hours as needed.     Marland Kitchen OVER THE COUNTER MEDICATION Apply 1 drop topically daily as needed. 1 drop CBD oil in vicks vapor rub . Applied to right thigh for pain relief.    Marland Kitchen PRESCRIPTION MEDICATION Inject 1 Dose as directed every 21 ( twenty-one) days. Chemo    . triamcinolone (KENALOG) 0.025 % ointment Apply 1 application topically 3 (three) times daily. 30 g 0  . triamcinolone lotion (KENALOG) 0.1 % Apply 1 application topically 3 (three) times daily. 120 mL 0  . [DISCONTINUED] folic acid (FOLVITE) 1 MG tablet Take 1 tablet (1 mg total) by mouth daily. 90 tablet 0   Current Facility-Administered Medications on File Prior to Visit  Medication Dose Route Frequency Provider Last Rate Last Admin  . sodium chloride flush (NS) 0.9 % injection 10 mL  10 mL Intracatheter PRN Curt Bears, MD   10 mL at 11/15/19 1400    Review of Systems:  As per HPI- otherwise negative.   Physical Examination: Vitals:   04/21/20 1054  BP: 136/78  Pulse: (!) 114  Resp: 16  Temp: 97.9 F (36.6 C)  SpO2: 99%   Vitals:   04/21/20 1054  Weight: 143 lb 9.6 oz (65.1 kg)   Body mass index is 25.44 kg/m. Ideal Body Weight:    GEN: no acute distress.  Looks well and normal weight  HEENT: Atraumatic,  Normocephalic.  Ears and Nose: No external deformity. CV: RRR- tachycardia, No M/G/R. No JVD. No thrill. No extra heart sounds. PULM: CTA B, no wheezes, crackles, rhonchi. No retractions. No resp. distress. No accessory muscle use.   ABD: S, ND, +BS. No rebound. No HSM.  She notes mild discomfort over most of her abdomen, most severe in the left upper quadrant EXTR: No c/c/e PSYCH: Normally interactive. Conversant.   Results for orders placed or performed in visit on 04/21/20  POCT Influenza A/B  Result Value Ref Range   Influenza A, POC Negative Negative   Influenza B, POC Negative Negative  POCT urinalysis dipstick  Result Value Ref Range   Color, UA light yellow (A) yellow   Clarity, UA clear clear   Glucose, UA negative negative mg/dL   Bilirubin, UA negative negative   Ketones, POC UA negative negative mg/dL   Spec Grav, UA 1.010 1.010 - 1.025   Blood, UA negative negative   pH, UA 7.0 5.0 - 8.0   Protein Ur, POC negative negative mg/dL   Urobilinogen, UA negative (A) 0.2 or 1.0 E.U./dL   Nitrite, UA Negative Negative   Leukocytes, UA Negative Negative      Assessment and Plan: Low grade fever - Plan: POCT Influenza A/B, Urine Culture, POCT urinalysis dipstick, DG Chest 2 View  Adenocarcinoma of right lung, stage 4 ([facility])  LUQ pain - Plan: CBC, Comprehensive metabolic panel, Amylase, Lipase  Cough - Plan: HYDROcodone-homatropine (HYCODAN) 5-1.5 MG/5ML syrup  Patient with history of advanced lung cancer currently undergoing chemotherapy.  Here today with low-grade fevers for about 1 week.  Flu test and urine negative today.  I am not able to test patient for COVID-19, she agrees to be tested at pharmacy as  soon as possible.  Plan for chest film and labs as above.  I offered to set up a CT scan of the abdomen pelvis today.  The patient would prefer to wait and see what her labs show which is also reasonable I cautioned her to watch carefully for any worsening of her  symptoms and seek care if she is getting worse Otherwise I will be in touch with her pending her labs Prescribed Hycodan use as needed for cough This visit occurred during the SARS-CoV-2 public health emergency.  Safety protocols were in place, including screening questions prior to the visit, additional usage of staff PPE, and extensive cleaning of exam room while observing appropriate contact time as indicated for disinfecting solutions.    Signed Lamar Blinks, MD   Received chest film as below, called to give patient results DG Chest 2 View  Result Date: 04/21/2020 CLINICAL DATA:  Cough and fever.  Known lung carcinoma EXAM: CHEST - 2 VIEW COMPARISON:  December 19, 2019 chest radiograph; chest CT February 25, 2020 FINDINGS: There is an apparent mass in the right lower lobe measuring 4.3 x 2.8 cm. There are multiple subcentimeter areas of opacity bilaterally, concerning for metastatic foci. There is a right pleural effusion with right base atelectasis. Port-A-Cath tip is in the superior vena cava. Heart size and pulmonary vascularity normal. No adenopathy. There is thoracolumbar levoscoliosis. IMPRESSION: Apparent mass right lower lobe, similar in appearance to prior CT. Multiple subcentimeter nodular opacities concerning for metastatic foci. Right pleural effusion is again noted with right base atelectasis. Appearance similar to most recent CT. Heart size normal. No adenopathy evident. Port-A-Cath tip in superior vena cava. Electronically Signed   By: Lowella Grip III M.D.   On: 04/21/2020 12:52   Received her lab work as below-called hematology at 4:28.  Asked to speak with your Dr. Julien Nordmann or his nurse, was transferred to a message line.  Called back at 4:30 to try and speak with Dr. Julien Nordmann, the office is now closed  We will send a MyChart message to Dr. Julien Nordmann re leukopenia Called patient, she would like to go ahead with CT abdomen pelvis-she reports she will be due for a restaging CT  in the next couple of weeks anyway.  Therefore, I will order both abdomen pelvis and chest CT scans for her  Addendum 11/16, received a message back from Dr. Essie Hart is not surprised by her white blood cell count given her cancer.  He is okay with me ordering CT abdomen pelvis  Urine culture came back, negative Results for orders placed or performed in visit on 04/21/20  CBC  Result Value Ref Range   WBC 2.1 Repeated and verified X2. (L) 4.0 - 10.5 K/uL   RBC 2.92 (L) 3.87 - 5.11 Mil/uL   Platelets 178.0 150 - 400 K/uL   Hemoglobin 9.5 (L) 12.0 - 15.0 g/dL   HCT 28.1 (L) 36 - 46 %   MCV 96.4 78.0 - 100.0 fl   MCHC 33.8 30.0 - 36.0 g/dL   RDW 15.8 (H) 11.5 - 15.5 %  Comprehensive metabolic panel  Result Value Ref Range   Sodium 137 135 - 145 mEq/L   Potassium 5.0 3.5 - 5.1 mEq/L   Chloride 100 96 - 112 mEq/L   CO2 32 19 - 32 mEq/L   Glucose, Bld 85 70 - 99 mg/dL   BUN 11 6 - 23 mg/dL   Creatinine, Ser 1.03 0.40 - 1.20 mg/dL   Total Bilirubin 0.4  0.2 - 1.2 mg/dL   Alkaline Phosphatase 82 39 - 117 U/L   AST 26 0 - 37 U/L   ALT 11 0 - 35 U/L   Total Protein 6.7 6.0 - 8.3 g/dL   Albumin 3.4 (L) 3.5 - 5.2 g/dL   GFR 54.22 (L) >60.00 mL/min   Calcium 9.4 8.4 - 10.5 mg/dL  Amylase  Result Value Ref Range   Amylase 56 27 - 131 U/L  Lipase  Result Value Ref Range   Lipase 29.0 11 - 59 U/L  POCT Influenza A/B  Result Value Ref Range   Influenza A, POC Negative Negative   Influenza B, POC Negative Negative  POCT urinalysis dipstick  Result Value Ref Range   Color, UA light yellow (A) yellow   Clarity, UA clear clear   Glucose, UA negative negative mg/dL   Bilirubin, UA negative negative   Ketones, POC UA negative negative mg/dL   Spec Grav, UA 1.010 1.010 - 1.025   Blood, UA negative negative   pH, UA 7.0 5.0 - 8.0   Protein Ur, POC negative negative mg/dL   Urobilinogen, UA negative (A) 0.2 or 1.0 E.U./dL   Nitrite, UA Negative Negative   Leukocytes, UA Negative  Negative

## 2020-04-21 ENCOUNTER — Ambulatory Visit (HOSPITAL_BASED_OUTPATIENT_CLINIC_OR_DEPARTMENT_OTHER)
Admission: RE | Admit: 2020-04-21 | Discharge: 2020-04-21 | Disposition: A | Discharge status: Home / Self Care | Payer: Medicare Other | Source: Ambulatory Visit | Attending: Family Medicine | Admitting: Family Medicine

## 2020-04-21 ENCOUNTER — Encounter: Payer: Self-pay | Admitting: Family Medicine

## 2020-04-21 ENCOUNTER — Other Ambulatory Visit: Payer: Self-pay

## 2020-04-21 ENCOUNTER — Ambulatory Visit (INDEPENDENT_AMBULATORY_CARE_PROVIDER_SITE_OTHER): Payer: Medicare Other | Admitting: Family Medicine

## 2020-04-21 VITALS — BP 136/78 | HR 102 | Temp 97.9°F | Resp 16 | Wt 143.6 lb

## 2020-04-21 DIAGNOSIS — C3491 Malignant neoplasm of unspecified part of right bronchus or lung: Secondary | ICD-10-CM | POA: Diagnosis not present

## 2020-04-21 DIAGNOSIS — R509 Fever, unspecified: Secondary | ICD-10-CM | POA: Diagnosis not present

## 2020-04-21 DIAGNOSIS — J9811 Atelectasis: Secondary | ICD-10-CM | POA: Diagnosis not present

## 2020-04-21 DIAGNOSIS — R059 Cough, unspecified: Secondary | ICD-10-CM | POA: Diagnosis not present

## 2020-04-21 DIAGNOSIS — R1012 Left upper quadrant pain: Secondary | ICD-10-CM

## 2020-04-21 DIAGNOSIS — J90 Pleural effusion, not elsewhere classified: Secondary | ICD-10-CM | POA: Diagnosis not present

## 2020-04-21 LAB — COMPREHENSIVE METABOLIC PANEL
ALT: 11 U/L (ref 0–35)
AST: 26 U/L (ref 0–37)
Albumin: 3.4 g/dL — ABNORMAL LOW (ref 3.5–5.2)
Alkaline Phosphatase: 82 U/L (ref 39–117)
BUN: 11 mg/dL (ref 6–23)
CO2: 32 mEq/L (ref 19–32)
Calcium: 9.4 mg/dL (ref 8.4–10.5)
Chloride: 100 mEq/L (ref 96–112)
Creatinine, Ser: 1.03 mg/dL (ref 0.40–1.20)
GFR: 54.22 mL/min — ABNORMAL LOW (ref >60.00)
Glucose, Bld: 85 mg/dL (ref 70–99)
Potassium: 5 mEq/L (ref 3.5–5.1)
Sodium: 137 mEq/L (ref 135–145)
Total Bilirubin: 0.4 mg/dL (ref 0.2–1.2)
Total Protein: 6.7 g/dL (ref 6.0–8.3)

## 2020-04-21 LAB — POCT URINALYSIS DIP (MANUAL ENTRY)
Bilirubin, UA: NEGATIVE
Blood, UA: NEGATIVE
Glucose, UA: NEGATIVE mg/dL
Ketones, POC UA: NEGATIVE mg/dL
Leukocytes, UA: NEGATIVE
Nitrite, UA: NEGATIVE
Protein Ur, POC: NEGATIVE mg/dL
Spec Grav, UA: 1.01 (ref 1.010–1.025)
Urobilinogen, UA: NEGATIVE E.U./dL — AB
pH, UA: 7 (ref 5.0–8.0)

## 2020-04-21 LAB — CBC
HCT: 28.1 % — ABNORMAL LOW (ref 36.0–46.0)
Hemoglobin: 9.5 g/dL — ABNORMAL LOW (ref 12.0–15.0)
MCHC: 33.8 g/dL (ref 30.0–36.0)
MCV: 96.4 fl (ref 78.0–100.0)
Platelets: 178 10*3/uL (ref 150.0–400.0)
RBC: 2.92 Mil/uL — ABNORMAL LOW (ref 3.87–5.11)
RDW: 15.8 % — ABNORMAL HIGH (ref 11.5–15.5)
WBC: 2.1 10*3/uL — ABNORMAL LOW (ref 4.0–10.5)

## 2020-04-21 LAB — POCT INFLUENZA A/B
Influenza A, POC: NEGATIVE
Influenza B, POC: NEGATIVE

## 2020-04-21 LAB — LIPASE: Lipase: 29 U/L (ref 11.0–59.0)

## 2020-04-21 LAB — AMYLASE: Amylase: 56 U/L (ref 27–131)

## 2020-04-21 MED ORDER — HYDROCODONE-HOMATROPINE 5-1.5 MG/5ML PO SYRP: 5.0000 mL | ORAL_SOLUTION | Freq: Three times a day (TID) | ORAL | 0 refills | Status: AC | PRN

## 2020-04-21 NOTE — Patient Instructions (Signed)
It was good to see you today- I am sorry you are not feeling well Please go to lab and then to the ground floor to have a chest x-ray  Please continue to monitor your temperature at home, and write down your fevers.  I will be in touch with your lab work, we can get a CT scan of your abdomen and pelvis if necessary  I prescribed cough syrup for you, remember this can make you drowsy.  Do not use it when you need to drive

## 2020-04-21 NOTE — Addendum Note (Signed)
Addended by: Lamar Blinks C on: 04/21/2020 04:44 PM   Modules accepted: Orders

## 2020-04-22 LAB — URINE CULTURE
MICRO NUMBER:: 11203264
Result:: NO GROWTH
SPECIMEN QUALITY:: ADEQUATE

## 2020-04-23 DIAGNOSIS — Z20822 Contact with and (suspected) exposure to covid-19: Secondary | ICD-10-CM | POA: Diagnosis not present

## 2020-04-24 ENCOUNTER — Telehealth: Payer: Self-pay | Admitting: Internal Medicine

## 2020-04-24 NOTE — Telephone Encounter (Signed)
Called pt to check in with her- explained rationale behind getting CT scan as she had fevers and abd pain.  Now her abd pain is resolved. Temps no higher than 100 at night only- normal during the day She prefers to observe for now which is reasonable- she will keep [place] closely posted about any changes Will have usual staging CT scan in a couple of weeks

## 2020-04-24 NOTE — Telephone Encounter (Signed)
-----   Message from Loleta Chance, [place] sent at 04/24/2020  8:32 AM EST ----- Patient states she feels weak "but that is her normal", still having low grade fever 99.9 at 9pm and 98 at 10 pm.  the cough syrup made her feel worse so she is taking tessalon for the cough. daytime temperature stays ok in the 97 range. Patient did ask if the CT ordered by you is absolutely necessary to do right now? She states her oncologist orders the same CT chest and abdomen once every couple of months. She is due for one in about 2 weeks. Is it ok for her to wait and have it done with him?

## 2020-04-24 NOTE — Telephone Encounter (Signed)
Caller Lexee Brashears Call Back # [phone]  Patient seen by Dr Lorelei Pont on 04/21/2020, asked to be tested for covid.  Patient states she is negative for covid.

## 2020-04-28 ENCOUNTER — Telehealth: Payer: Self-pay | Admitting: Medical Oncology

## 2020-04-28 NOTE — Telephone Encounter (Signed)
Low grade fevers 100.4,  99.6,cough.  CXR done a week ago and pt said the doctor "did not see anything".  "IMPRESSION: Apparent mass right lower lobe, similar in appearance to prior CT. Multiple subcentimeter nodular opacities concerning for metastatic foci. Right pleural effusion is again noted with right base atelectasis. Appearance similar to most recent CT. Heart size normal. No adenopathy evident. Port-A-Cath tip in superior vena"  I instructed pt to do some deep breathing and coughing exercises and keep appt for wed. She is also taking Tessalon perles.

## 2020-04-29 NOTE — Progress Notes (Signed)
[place], MD [address] [place] [address]  DIAGNOSIS: Stage IV (T1b, N2, M1b) non-small cell lung cancer, adenocarcinoma diagnosed in March 2017 and presented with right lower lobe lung nodule in addition to mediastinal lymphadenopathy and metastatic bone lesions.   Molecular studies:PDL 1 TPS <1%.   [employer] Studies: Positive for ERBB2 [id number]. Negative for EGFR, KRAS, ALK, BRAF, MET, RET and ROS1.  PRIOR THERAPY: 1) Induction systemic chemotherapy with carboplatin for AUC of 5 and Alimta 500 MG/M2 every 3 weeks is status post 6 cycles at the [facility], last dose was given 03/05/2016 with stable disease. 2) palliative radiation to the metastatic bone disease in the lower back and pelvic area  CURRENT THERAPY:  1) Maintenance systemic chemotherapy with Alimta 500 MG/M2 every 3 weeks status post71cycles. First dose was given 03/25/2016.  2) Zometa 4 mg IV every 12 week for metastatic bone disease.  INTERVAL HISTORY: Valerie Long 74 y.o. female returns to the clinic today for a follow-up visit.  The patient has been feeling unwell over the course of the last 2-3 weeks.  The patient reports low-grade fevers running 99-100.4. She states that she did not have a fever last night.  She was seen by her PCP who performed a chest x-ray which did not show any clear etiology of her fevers.  She also had a urinalysis performed. She was also checked for the flu and covid-19 which was negative.  A CT scan of the chest, abdomen, and pelvis was ordered but the patient declined to have that performed at this time due to our office performing restaging CT scans.  The patient chills, night sweats, and weight loss. The patient reports her baseline cough which produces clear/white phlegm. She states her cough is "more or less" the same as it has been the last year.  She takes [employer] but stopped taking  it recently.  Her shortness of breath is stable.  She denies any hemoptysis. She denies nasal congestion or sore throat. She has nausea and 1 episode of vomiting following her last treatment. She has compazine and zofran but states these cause constipation and that she does not want to take them because the nausea and vomiting is "not bad".  She denies any diarrhea or constipation.  She denies any headache or visual changes.  The patient is here today for evaluation, repeat blood work, and consideration of starting cycle #72.  MEDICAL HISTORY: Past Medical History:  Diagnosis Date  . Adenocarcinoma of right lung, stage 4 ([place]) 2017  . Anemia   . Arthritis   . Bone metastases ([place]) 02/24/2017  . Encounter for antineoplastic chemotherapy 03/17/2016  . Hypercholesterolemia   . Hypertension 06/18/2016  . Osteopenia   . 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 Pneumonia   . Shortness of breath dyspnea     ALLERGIES:  has No Known Allergies.  MEDICATIONS:  Current Outpatient Medications  Medication Sig Dispense Refill  . acetaminophen (TYLENOL) 500 MG tablet Take 1,000 mg by mouth every 6 (six) hours as needed for moderate pain or headache.    . benzonatate (TESSALON) 100 MG capsule TAKE 1 CAPSULE BY MOUTH THREE TIMES A DAY AS NEEDED FOR COUGH 60 capsule 0  . benzonatate (TESSALON) 200 MG capsule TAKE 1 CAPSULE BY MOUTH THREE TIMES A DAY AS NEEDED FOR COUGH 40 capsule 0  . budesonide-formoterol (SYMBICORT)  80-4.5 MCG/ACT inhaler Inhale 2 puffs into the lungs 2 (two) times daily. 1 Inhaler 3  . Calcium Carb-Cholecalciferol (CALCIUM 600/VITAMIN D3 PO) Take by mouth.    . Cyanocobalamin (B-12 PO) Take 2 tablets by mouth daily.    Marland Kitchen dexamethasone (DECADRON) 4 MG tablet Take 27m by mouth twice daily the day before, of, and after chemo 40 tablet 1  . Fish Oil-Cholecalciferol (OMEGA-3 + VITAMIN D3 PO) Take 1 tablet by mouth daily.    . folic acid (FOLVITE) 1 MG tablet Take 1 tablet (1  mg total) by mouth daily. 90 tablet 0  . gabapentin (NEURONTIN) 100 MG capsule Take 2 capsules (200 mg total) by mouth 3 (three) times daily. 180 capsule 0  . HYDROcodone-homatropine (HYCODAN) 5-1.5 MG/5ML syrup Take 5 mLs by mouth every 6 (six) hours as needed for cough. 100 mL 0  . ketoconazole (NIZORAL) 2 % cream APPLY TO AFFECTED AREA EVERY DAY 30 g 0  . levocetirizine (XYZAL) 5 MG tablet Take 1 tablet (5 mg total) by mouth every evening. 90 tablet 3  . lidocaine-prilocaine (EMLA) cream Apply 1 application topically as needed. Apply 1-2 tsp over port site 1-2 hours prior to chemo. 30 g 2  . meloxicam (MOBIC) 15 MG tablet TAKE 1 TABLET BY MOUTH EVERY DAY AS NEEDED FOR PAIN 30 tablet 0  . ondansetron (ZOFRAN) 8 MG tablet Take 8 mg by mouth every 8 (eight) hours as needed.     .Marland KitchenOVER THE COUNTER MEDICATION Apply 1 drop topically daily as needed. 1 drop CBD oil in vicks vapor rub . Applied to right thigh for pain relief.    .Marland KitchenPRESCRIPTION MEDICATION Inject 1 Dose as directed every 21 ( twenty-one) days. Chemo    . triamcinolone (KENALOG) 0.025 % ointment Apply 1 application topically 3 (three) times daily. 30 g 0  . triamcinolone lotion (KENALOG) 0.1 % Apply 1 application topically 3 (three) times daily. 120 mL 0   No current facility-administered medications for this visit.   Facility-Administered Medications Ordered in Other Visits  Medication Dose Route Frequency Provider Last Rate Last Admin  . sodium chloride flush (NS) 0.9 % injection 10 mL  10 mL Intracatheter PRN MCurt Bears MD   10 mL at 11/15/19 1400    SURGICAL HISTORY:  Past Surgical History:  Procedure Laterality Date  . CESAREAN SECTION     myomectomy  . COLONOSCOPY W/ POLYPECTOMY    . IR GENERIC HISTORICAL  08/17/2016   IR [place] ACC[place] RIGHT 08/17/2016 HJacqulynn Cadet MD [facility]-INTERV RAD  . IR GENERIC HISTORICAL  08/17/2016   IR FLUORO GUIDE PORT INSERTION RIGHT 08/17/2016 HJacqulynn Cadet MD [facility]INTER[facility]  .  MEDIASTINOSCOPY N/A 09/25/2015   Procedure: MEDIASTINOSCOPY;  Surgeon: SMelrose Nakayama MD;  [facility]  Service: Thoracic;  Laterality: N/A;  . VIDEO BRONCHOSCOPY Bilateral 08/19/2015   Procedure: VIDEO BRONCHOSCOPY WITH FLUORO;  Surgeon: RRigoberto Noel MD;  [facility]  Service: Cardiopulmonary;  Laterality: Bilateral;  . VIDEO BRONCHOSCOPY N/A 09/08/2015   Procedure: VIDEO BRONCHOSCOPY WITH FLUORO;  Surgeon: RRigoberto Noel MD;  [facility]  Service: Thoracic;  Laterality: N/A;  . VIDEO BRONCHOSCOPY WITH ENDOBRONCHIAL ULTRASOUND Right 09/08/2015   Procedure: ATTEMPTED VIDEO BRONCHOSCOPY ENDOBRONCHIAL ULTRASOUND  ;  Surgeon: RRigoberto Noel MD;  [facility]  Service: Thoracic;  Laterality: Right;    REVIEW OF SYSTEMS:   Constitutional: Positive for decreased appetite. Positive for low grade fevers. Positive for fatigue. Negative  for chills. HENT: Negative for mouth sores, nosebleeds, sore throat and trouble swallowing.   Eyes: Negative for eye problems and icterus.  Respiratory: Positive for baseline cough. Negative for hemoptysis, shortness of breath and wheezing.   Cardiovascular: Positive for chronic bilateral lower extremity swelling. Negative for chest pain. Gastrointestinal: Positive for nausea and vomiting following treatment. Negative for abdominal pain, constipation, and diarrhea. Genitourinary: Negative for bladder incontinence, difficulty urinating, dysuria, frequency and hematuria.   Musculoskeletal: Positive for chronic back pain. Negative for gait problem, neck pain and neck stiffness.  Skin: Negative for itching and rash.  Neurological: Negative for dizziness, extremity weakness, gait problem, headaches, light-headedness and seizures.  Hematological: Negative for adenopathy. Does not bruise/bleed easily.  Psychiatric/Behavioral: Negative for confusion, depression and sleep disturbance. The patient is not nervous/anxious.      PHYSICAL EXAMINATION:   Blood pressure 137/86, pulse (!) 105, temperature 98 F (36.7 C), resp. rate (!) 98, height '5\' 3"'  (1.6 m), weight 143 lb 11.2 oz (65.2 kg), last menstrual period 06/09/1998.  ECOG PERFORMANCE STATUS: 1 - Symptomatic but completely ambulatory  Physical Exam  Constitutional: Oriented to person, place, and time and well-developed, well-nourished, and in no distress.  HENT:  Head: Normocephalic and atraumatic.  Mouth/Throat: Oropharynx is clear and moist. No oropharyngeal exudate.  Eyes: Conjunctivae are normal. Right eye exhibits no discharge. Left eye exhibits no discharge. No scleral icterus.  Neck: Normal range of motion. Neck supple.  Cardiovascular: Normal rate, regular rhythm, normal heart sounds and intact distal pulses.  Pulmonary/Chest: Effort normal and breath sounds norma except diminished breath sounds at base of right lung. No respiratory distress. No wheezes. No rales.  Abdominal: Soft. Bowel sounds are normal. Exhibits no distension and no mass. There is no tenderness.  Musculoskeletal: Positive for bilateral lower extremity edema (stable).Normal range of motion.  Lymphadenopathy:  No cervical adenopathy.  Neurological: Alert and oriented to person, place, and time. Exhibits normal muscle tone. Gait normal but uses cane for assistance. Coordination normal.  Skin:Skin is warm and dry. No rash noted. Not diaphoretic. No erythema. No pallor.  Psychiatric: Mood, memory and judgment normal.  Vitals reviewed.  LABORATORY DATA: Lab Results  Component Value Date   WBC 5.0 04/30/2020   HGB 9.2 (L) 04/30/2020   HCT 28.0 (L) 04/30/2020   MCV 98.2 04/30/2020   PLT 364 04/30/2020      Chemistry      Component Value Date/Time   NA 138 04/30/2020 0824   NA 137 06/09/2017 0901   K 3.9 04/30/2020 0824   K 3.9 06/09/2017 0901   CL 105 04/30/2020 0824   CO2 23 04/30/2020 0824   CO2 25 06/09/2017 0901   BUN 18 04/30/2020 0824   BUN 19.0 06/09/2017 0901   CREATININE 1.06  (H) 04/30/2020 0824   CREATININE 1.2 (H) 06/09/2017 0901   GLU 170 01/23/2016 0000      Component Value Date/Time   CALCIUM 9.6 04/30/2020 0824   CALCIUM 9.9 06/09/2017 0901   ALKPHOS 94 04/30/2020 0824   ALKPHOS 63 06/09/2017 0901   AST 25 04/30/2020 0824   AST 35 (H) 06/09/2017 0901   ALT 9 04/30/2020 0824   ALT 23 06/09/2017 0901   BILITOT 0.3 04/30/2020 0824   BILITOT 0.65 06/09/2017 0901       RADIOGRAPHIC STUDIES:  DG Chest 2 View  Result Date: 04/21/2020 CLINICAL DATA:  Cough and fever.  Known lung carcinoma EXAM: CHEST - 2 VIEW COMPARISON:  December 19, 2019 chest radiograph;  chest CT February 25, 2020 FINDINGS: There is an apparent mass in the right lower lobe measuring 4.3 x 2.8 cm. There are multiple subcentimeter areas of opacity bilaterally, concerning for metastatic foci. There is a right pleural effusion with right base atelectasis. Port-A-Cath tip is in the superior vena cava. Heart size and pulmonary vascularity normal. No adenopathy. There is thoracolumbar levoscoliosis. IMPRESSION: Apparent mass right lower lobe, similar in appearance to prior CT. Multiple subcentimeter nodular opacities concerning for metastatic foci. Right pleural effusion is again noted with right base atelectasis. Appearance similar to most recent CT. Heart size normal. No adenopathy evident. Port-A-Cath tip in superior vena cava. Electronically Signed   By: Lowella Grip III M.D.   On: 04/21/2020 12:52     ASSESSMENT/PLAN:  This is a very pleasant 74 year old Hispanic female diagnosed with metastatic non-small cell lung cancer, adenocarcinoma. She presented with a right lower lobe lung nodule in addition to mediastinal lymphadenopathy and metastatic bone lesions. She was diagnosed in March 2017. Her PDL 1 expression is <1% and she has no actionable mutations.   She previously underwent inductiontreatmentwith systemic chemotherapy withCarboplatin and Alimta. She is currently undergoing  maintenance therapy withAlimta. She is status post71cycles. The patient has been tolerating this treatment fairly well without any concerning adversesideeffects.  The patient has been experiencing low grade fevers. CXR was negative and her UA was negative.   The patient was seen with Dr. Julien Nordmann today.  Labs were reviewed.   Dr. Earlie Server recommends she proceed with cycle #72 today as scheduled..  I will arrange for restaging CT scan of the chest, abdomen, and pelvis prior to starting her next cycle of treatment. Since she has been having low grade fevers, we will arrange for this to be performed next week. If there is any evidence of infection, I will call the patient before the next appointment with the results.   We will see her back for follow-up visit in 3 weeks for evaluation.   The patient has nausea medication at home but is reluctant to take it due to constipation. Discussed if needed she may take her anti-emetic and use stool softeners. She insists that her nausea is "not bad" but she has her anti-emetic if needed.  The patient was advised to call immediately if she has any concerning symptoms in the interval. The patient voices understanding of current disease status and treatment options and is in agreement with the current care plan. All questions were answered. The patient knows to call the clinic with any problems, questions or concerns. We can certainly see the patient much sooner if necessary       Orders Placed This Encounter  Procedures  . CT Abdomen Pelvis W Contrast    Standing Status:   Future    Standing Expiration Date:   04/30/2021    Order Specific Question:   If indicated for the ordered procedure, I authorize the administration of contrast media per Radiology protocol    Answer:   Yes    Order Specific Question:   Preferred imaging location?    Answer:   [facility]    Order Specific Question:   Is Oral Contrast requested for this exam?     Answer:   Yes, Per Radiology protocol  . CT Chest W Contrast    Standing Status:   Future    Standing Expiration Date:   04/30/2021    Order Specific Question:   If indicated for the ordered procedure, I authorize the  administration of contrast media per Radiology protocol    Answer:   Yes    Order Specific Question:   Preferred imaging location?    Answer:   [place], [facility] 04/30/20  ADDENDUM: Hematology/Oncology Attending: I had a face-to-face encounter with the patient today.  I recommended her care plan.  This is a very pleasant 74 years old Hispanic female with metastatic non-small cell lung cancer, adenocarcinoma diagnosed in March 2017 with no actionable mutation and negative PD-L1 expression.  The patient started induction systemic chemotherapy with carboplatin and Alimta for 6 cycles followed by maintenance treatment with single agent Alimta status post total treatment of 71 cycles.  The patient has been tolerating the treatment well with no concerning adverse effect except for fatigue.  She also had some low-grade fever recently and she had work-up by her primary care physician including chest x-ray and UA that were negative. She is feeling much better today. I recommended for her to proceed with her treatment today as planned. We will arrange for the patient to have repeat CT scan of the chest, abdomen pelvis sooner for evaluation of her disease and to rule out any disease progression. She will come back for follow-up visit in 3 weeks for evaluation or sooner if needed. The patient was advised to call immediately if she has any concerning symptoms in the interval.  Disclaimer: This note was dictated with voice recognition software. Similar sounding words can inadvertently be transcribed and may be missed upon review. Eilleen Kempf, MD 04/30/20

## 2020-04-30 ENCOUNTER — Other Ambulatory Visit: Payer: Self-pay | Admitting: Physician Assistant

## 2020-04-30 ENCOUNTER — Other Ambulatory Visit: Payer: Self-pay

## 2020-04-30 ENCOUNTER — Inpatient Hospital Stay: Payer: Medicare Other

## 2020-04-30 ENCOUNTER — Encounter: Payer: Self-pay | Admitting: Physician Assistant

## 2020-04-30 ENCOUNTER — Inpatient Hospital Stay (HOSPITAL_BASED_OUTPATIENT_CLINIC_OR_DEPARTMENT_OTHER): Payer: Medicare Other | Admitting: Physician Assistant

## 2020-04-30 ENCOUNTER — Telehealth: Payer: Self-pay

## 2020-04-30 VITALS — Resp 72

## 2020-04-30 VITALS — BP 137/86 | HR 105 | Temp 98.0°F | Resp 98 | Ht 63.0 in | Wt 143.7 lb

## 2020-04-30 DIAGNOSIS — E7800 Pure hypercholesterolemia, unspecified: Secondary | ICD-10-CM | POA: Diagnosis not present

## 2020-04-30 DIAGNOSIS — C3491 Malignant neoplasm of unspecified part of right bronchus or lung: Secondary | ICD-10-CM

## 2020-04-30 DIAGNOSIS — C3431 Malignant neoplasm of lower lobe, right bronchus or lung: Secondary | ICD-10-CM | POA: Diagnosis not present

## 2020-04-30 DIAGNOSIS — G8929 Other chronic pain: Secondary | ICD-10-CM | POA: Diagnosis not present

## 2020-04-30 DIAGNOSIS — C7951 Secondary malignant neoplasm of bone: Secondary | ICD-10-CM | POA: Diagnosis not present

## 2020-04-30 DIAGNOSIS — C3490 Malignant neoplasm of unspecified part of unspecified bronchus or lung: Secondary | ICD-10-CM

## 2020-04-30 DIAGNOSIS — M8580 Other specified disorders of bone density and structure, unspecified site: Secondary | ICD-10-CM | POA: Diagnosis not present

## 2020-04-30 DIAGNOSIS — Z5111 Encounter for antineoplastic chemotherapy: Secondary | ICD-10-CM

## 2020-04-30 LAB — CMP (CANCER CENTER ONLY)
ALT: 9 U/L (ref 0–44)
AST: 25 U/L (ref 15–41)
Albumin: 2.9 g/dL — ABNORMAL LOW (ref 3.5–5.0)
Alkaline Phosphatase: 94 U/L (ref 38–126)
Anion gap: 10 (ref 5–15)
BUN: 18 mg/dL (ref 8–23)
CO2: 23 mmol/L (ref 22–32)
Calcium: 9.6 mg/dL (ref 8.9–10.3)
Chloride: 105 mmol/L (ref 98–111)
Creatinine: 1.06 mg/dL — ABNORMAL HIGH (ref 0.44–1.00)
GFR, Estimated: 56 mL/min — ABNORMAL LOW (ref >60)
Glucose, Bld: 139 mg/dL — ABNORMAL HIGH (ref 70–99)
Potassium: 3.9 mmol/L (ref 3.5–5.1)
Sodium: 138 mmol/L (ref 135–145)
Total Bilirubin: 0.3 mg/dL (ref 0.3–1.2)
Total Protein: 7.3 g/dL (ref 6.5–8.1)

## 2020-04-30 LAB — CBC WITH DIFFERENTIAL (CANCER CENTER ONLY)
Abs Immature Granulocytes: 0.01 10*3/uL (ref 0.00–0.07)
Basophils Absolute: 0 10*3/uL (ref 0.0–0.1)
Basophils Relative: 0 %
Eosinophils Absolute: 0 10*3/uL (ref 0.0–0.5)
Eosinophils Relative: 0 %
HCT: 28 % — ABNORMAL LOW (ref 36.0–46.0)
Hemoglobin: 9.2 g/dL — ABNORMAL LOW (ref 12.0–15.0)
Immature Granulocytes: 0 %
Lymphocytes Relative: 11 %
Lymphs Abs: 0.5 10*3/uL — ABNORMAL LOW (ref 0.7–4.0)
MCH: 32.3 pg (ref 26.0–34.0)
MCHC: 32.9 g/dL (ref 30.0–36.0)
MCV: 98.2 fL (ref 80.0–100.0)
Monocytes Absolute: 0.5 10*3/uL (ref 0.1–1.0)
Monocytes Relative: 10 %
Neutro Abs: 4 10*3/uL (ref 1.7–7.7)
Neutrophils Relative %: 79 %
Platelet Count: 364 10*3/uL (ref 150–400)
RBC: 2.85 MIL/uL — ABNORMAL LOW (ref 3.87–5.11)
RDW: 16.1 % — ABNORMAL HIGH (ref 11.5–15.5)
WBC Count: 5 10*3/uL (ref 4.0–10.5)
nRBC: 0 % (ref 0.0–0.2)

## 2020-04-30 MED ORDER — PROCHLORPERAZINE MALEATE 10 MG PO TABS
ORAL_TABLET | ORAL | Status: AC
  Filled 2020-04-30: qty 1

## 2020-04-30 MED ORDER — SODIUM CHLORIDE 0.9 % IV SOLN
500.0000 mg/m2 | Freq: Once | INTRAVENOUS | Status: AC
  Administered 2020-04-30: 900 mg via INTRAVENOUS
  Filled 2020-04-30: qty 16

## 2020-04-30 MED ORDER — PROCHLORPERAZINE MALEATE 10 MG PO TABS
10.0000 mg | ORAL_TABLET | Freq: Once | ORAL | Status: AC
  Administered 2020-04-30: 10 mg via ORAL

## 2020-04-30 MED ORDER — SODIUM CHLORIDE 0.9% FLUSH
10.0000 mL | INTRAVENOUS | Status: DC | PRN
  Administered 2020-04-30: 10 mL
  Filled 2020-04-30: qty 10

## 2020-04-30 MED ORDER — HEPARIN SOD (PORK) LOCK FLUSH 100 UNIT/ML IV SOLN
500.0000 [IU] | Freq: Once | INTRAVENOUS | Status: AC | PRN
  Administered 2020-04-30: 500 [IU]
  Filled 2020-04-30: qty 5

## 2020-04-30 MED ORDER — SODIUM CHLORIDE 0.9 % IV SOLN
Freq: Once | INTRAVENOUS | Status: AC
  Filled 2020-04-30: qty 250

## 2020-04-30 NOTE — Progress Notes (Signed)
Patient stable upon discharge. No complaints.

## 2020-04-30 NOTE — Patient Instructions (Signed)
[facility] Discharge Instructions for Patients Receiving Chemotherapy  Today you received the following chemotherapy agents Alimta  To help prevent nausea and vomiting after your treatment, we encourage you to take your nausea medication as directed.  If you develop nausea and vomiting that is not controlled by your nausea medication, call the clinic.   BELOW ARE SYMPTOMS THAT SHOULD BE REPORTED IMMEDIATELY:  *FEVER GREATER THAN 100.5 F  *CHILLS WITH OR WITHOUT FEVER  NAUSEA AND VOMITING THAT IS NOT CONTROLLED WITH YOUR NAUSEA MEDICATION  *UNUSUAL SHORTNESS OF BREATH  *UNUSUAL BRUISING OR BLEEDING  TENDERNESS IN MOUTH AND THROAT WITH OR WITHOUT PRESENCE OF ULCERS  *URINARY PROBLEMS  *BOWEL PROBLEMS  UNUSUAL RASH Items with * indicate a potential emergency and should be followed up as soon as possible.  Feel free to call the clinic should you have any questions or concerns. The clinic phone number is [phone].  Please show the [place] at check-in to the Emergency Department and triage nurse.

## 2020-04-30 NOTE — Progress Notes (Signed)
Nutrition Assessment:  Patient identified on Malnutrition Screening report for weight loss and poor appetite.  74 year old female with stage IV non small cell lung cancer with metastatic disease to bone.  Past medical history of HTN, hypercholesterolemia.    Met with patient during infusion.  Patient reports that her appetite is decreased. Seems as if it decreased after started take iron tablets.  Patient reports that she eats rice and beans, vegetables, salads, soup, eggs.  Has questions about if it is ok to eat oatmeal, cream of wheat and dairy products.      Medications: zofran, calcium carbonate/vit d, fish oil, vit b 12, folic acid  Labs: reviewed  Anthropometrics:   Height: 63 inches Weight: 143 lb today 149 lb 8/12 BMI: 25  4% weight loss in the last 3 months   NUTRITION DIAGNOSIS: Inadequate oral intake related to cancer related treatment side effects, ?? Iron as evidenced by 4% weight loss in the last 4 months and decreased appetite.   INTERVENTION:  Discussed foods that are appropriate for patient to eat during treatment.   Encouraged high calorie, high protein foods during treatment. Handout provided Encouraged patient to try ensure/boost shakes for additional calories and protien Contact information provided    MONITORING, EVALUATION, GOAL: weight trends, intake   NEXT VISIT: to be determined with treatments  Josuha Fontanez B. Zenia Resides, [place], [place] Registered Dietitian [phone] (mobile)

## 2020-04-30 NOTE — Telephone Encounter (Signed)
LM for pt advising her PCP already ordered the CT scans and they were authorized by her insurance, therefore dr. Julien Nordmann wants her to schedule them. I also included the contact number to Radiology for scheduling.

## 2020-05-07 ENCOUNTER — Telehealth: Payer: Self-pay | Admitting: Internal Medicine

## 2020-05-07 ENCOUNTER — Inpatient Hospital Stay (HOSPITAL_COMMUNITY)
Admission: EM | Admit: 2020-05-07 | Discharge: 2020-05-10 | DRG: 809 | Disposition: A | Discharge status: Home / Self Care | Payer: Medicare Other | Attending: Internal Medicine | Admitting: Internal Medicine

## 2020-05-07 ENCOUNTER — Other Ambulatory Visit: Payer: Self-pay

## 2020-05-07 ENCOUNTER — Encounter (HOSPITAL_COMMUNITY): Payer: Self-pay

## 2020-05-07 ENCOUNTER — Emergency Department (HOSPITAL_COMMUNITY): Payer: Medicare Other

## 2020-05-07 DIAGNOSIS — Z20822 Contact with and (suspected) exposure to covid-19: Secondary | ICD-10-CM | POA: Diagnosis present

## 2020-05-07 DIAGNOSIS — Z8249 Family history of ischemic heart disease and other diseases of the circulatory system: Secondary | ICD-10-CM

## 2020-05-07 DIAGNOSIS — Z803 Family history of malignant neoplasm of breast: Secondary | ICD-10-CM

## 2020-05-07 DIAGNOSIS — R509 Fever, unspecified: Secondary | ICD-10-CM

## 2020-05-07 DIAGNOSIS — Z791 Long term (current) use of non-steroidal anti-inflammatories (NSAID): Secondary | ICD-10-CM | POA: Diagnosis not present

## 2020-05-07 DIAGNOSIS — Z5111 Encounter for antineoplastic chemotherapy: Secondary | ICD-10-CM

## 2020-05-07 DIAGNOSIS — D709 Neutropenia, unspecified: Secondary | ICD-10-CM | POA: Diagnosis present

## 2020-05-07 DIAGNOSIS — C3431 Malignant neoplasm of lower lobe, right bronchus or lung: Secondary | ICD-10-CM | POA: Diagnosis present

## 2020-05-07 DIAGNOSIS — M8580 Other specified disorders of bone density and structure, unspecified site: Secondary | ICD-10-CM | POA: Diagnosis present

## 2020-05-07 DIAGNOSIS — C7951 Secondary malignant neoplasm of bone: Secondary | ICD-10-CM | POA: Diagnosis present

## 2020-05-07 DIAGNOSIS — I10 Essential (primary) hypertension: Secondary | ICD-10-CM | POA: Diagnosis present

## 2020-05-07 DIAGNOSIS — Z7951 Long term (current) use of inhaled steroids: Secondary | ICD-10-CM | POA: Diagnosis not present

## 2020-05-07 DIAGNOSIS — R5081 Fever presenting with conditions classified elsewhere: Secondary | ICD-10-CM | POA: Diagnosis present

## 2020-05-07 DIAGNOSIS — C3491 Malignant neoplasm of unspecified part of right bronchus or lung: Secondary | ICD-10-CM

## 2020-05-07 DIAGNOSIS — R911 Solitary pulmonary nodule: Secondary | ICD-10-CM | POA: Diagnosis not present

## 2020-05-07 DIAGNOSIS — E7800 Pure hypercholesterolemia, unspecified: Secondary | ICD-10-CM | POA: Diagnosis present

## 2020-05-07 DIAGNOSIS — M1990 Unspecified osteoarthritis, unspecified site: Secondary | ICD-10-CM | POA: Diagnosis present

## 2020-05-07 DIAGNOSIS — Z923 Personal history of irradiation: Secondary | ICD-10-CM

## 2020-05-07 DIAGNOSIS — Z79899 Other long term (current) drug therapy: Secondary | ICD-10-CM

## 2020-05-07 DIAGNOSIS — R059 Cough, unspecified: Secondary | ICD-10-CM | POA: Diagnosis not present

## 2020-05-07 DIAGNOSIS — J90 Pleural effusion, not elsewhere classified: Secondary | ICD-10-CM | POA: Diagnosis not present

## 2020-05-07 LAB — CBC WITH DIFFERENTIAL/PLATELET
Abs Immature Granulocytes: 0.01 10*3/uL (ref 0.00–0.07)
Basophils Absolute: 0 10*3/uL (ref 0.0–0.1)
Basophils Relative: 1 %
Eosinophils Absolute: 0 10*3/uL (ref 0.0–0.5)
Eosinophils Relative: 2 %
HCT: 29.2 % — ABNORMAL LOW (ref 36.0–46.0)
Hemoglobin: 9.3 g/dL — ABNORMAL LOW (ref 12.0–15.0)
Immature Granulocytes: 1 %
Lymphocytes Relative: 25 %
Lymphs Abs: 0.5 10*3/uL — ABNORMAL LOW (ref 0.7–4.0)
MCH: 32 pg (ref 26.0–34.0)
MCHC: 31.8 g/dL (ref 30.0–36.0)
MCV: 100.3 fL — ABNORMAL HIGH (ref 80.0–100.0)
Monocytes Absolute: 0.2 10*3/uL (ref 0.1–1.0)
Monocytes Relative: 11 %
Neutro Abs: 1.2 10*3/uL — ABNORMAL LOW (ref 1.7–7.7)
Neutrophils Relative %: 60 %
Platelets: 165 10*3/uL (ref 150–400)
RBC: 2.91 MIL/uL — ABNORMAL LOW (ref 3.87–5.11)
RDW: 15.2 % (ref 11.5–15.5)
WBC: 2 10*3/uL — ABNORMAL LOW (ref 4.0–10.5)
nRBC: 0 % (ref 0.0–0.2)

## 2020-05-07 LAB — PROTIME-INR
INR: 1 (ref 0.8–1.2)
Prothrombin Time: 12.8 seconds (ref 11.4–15.2)

## 2020-05-07 LAB — COMPREHENSIVE METABOLIC PANEL
ALT: 14 U/L (ref 0–44)
AST: 28 U/L (ref 15–41)
Albumin: 3.4 g/dL — ABNORMAL LOW (ref 3.5–5.0)
Alkaline Phosphatase: 73 U/L (ref 38–126)
Anion gap: 11 (ref 5–15)
BUN: 22 mg/dL (ref 8–23)
CO2: 26 mmol/L (ref 22–32)
Calcium: 9.7 mg/dL (ref 8.9–10.3)
Chloride: 98 mmol/L (ref 98–111)
Creatinine, Ser: 1.01 mg/dL — ABNORMAL HIGH (ref 0.44–1.00)
GFR, Estimated: 59 mL/min — ABNORMAL LOW (ref >60)
Glucose, Bld: 106 mg/dL — ABNORMAL HIGH (ref 70–99)
Potassium: 4.6 mmol/L (ref 3.5–5.1)
Sodium: 135 mmol/L (ref 135–145)
Total Bilirubin: 0.9 mg/dL (ref 0.3–1.2)
Total Protein: 7.4 g/dL (ref 6.5–8.1)

## 2020-05-07 LAB — URINALYSIS, ROUTINE W REFLEX MICROSCOPIC
Bilirubin Urine: NEGATIVE
Glucose, UA: NEGATIVE mg/dL
Hgb urine dipstick: NEGATIVE
Ketones, ur: NEGATIVE mg/dL
Leukocytes,Ua: NEGATIVE
Nitrite: NEGATIVE
Protein, ur: NEGATIVE mg/dL
Specific Gravity, Urine: 1.014 (ref 1.005–1.030)
pH: 6 (ref 5.0–8.0)

## 2020-05-07 LAB — RESP PANEL BY RT-PCR (FLU A&B, COVID) ARPGX2
Influenza A by PCR: NEGATIVE
Influenza B by PCR: NEGATIVE
SARS Coronavirus 2 by RT PCR: NEGATIVE

## 2020-05-07 LAB — APTT: aPTT: 36 seconds (ref 24–36)

## 2020-05-07 LAB — LACTIC ACID, PLASMA
Lactic Acid, Venous: 1.2 mmol/L (ref 0.5–1.9)
Lactic Acid, Venous: 1 mmol/L (ref 0.5–1.9)

## 2020-05-07 MED ORDER — ENOXAPARIN SODIUM 40 MG/0.4ML ~~LOC~~ SOLN
40.0000 mg | SUBCUTANEOUS | Status: DC
  Filled 2020-05-07: qty 0.4

## 2020-05-07 MED ORDER — ACETAMINOPHEN 325 MG PO TABS
650.0000 mg | ORAL_TABLET | Freq: Four times a day (QID) | ORAL | Status: DC | PRN
  Administered 2020-05-08 – 2020-05-10 (×3): 650 mg via ORAL
  Filled 2020-05-07 (×3): qty 2

## 2020-05-07 MED ORDER — ONDANSETRON HCL 4 MG/2ML IJ SOLN: 4.0000 mg | Freq: Four times a day (QID) | INTRAMUSCULAR | Status: DC | PRN

## 2020-05-07 MED ORDER — ENSURE ENLIVE PO LIQD
237.0000 mL | Freq: Two times a day (BID) | ORAL | Status: DC
  Administered 2020-05-08 – 2020-05-10 (×3): 237 mL via ORAL

## 2020-05-07 MED ORDER — GUAIFENESIN-DM 100-10 MG/5ML PO SYRP
5.0000 mL | ORAL_SOLUTION | ORAL | Status: DC | PRN
  Administered 2020-05-07: 5 mL via ORAL
  Filled 2020-05-07: qty 10

## 2020-05-07 MED ORDER — SODIUM CHLORIDE 0.9 % IV BOLUS
1000.0000 mL | Freq: Once | INTRAVENOUS | Status: AC
  Administered 2020-05-07: 1000 mL via INTRAVENOUS

## 2020-05-07 MED ORDER — ACETAMINOPHEN 650 MG RE SUPP: 650.0000 mg | Freq: Four times a day (QID) | RECTAL | Status: DC | PRN

## 2020-05-07 MED ORDER — SODIUM CHLORIDE 0.9 % IV SOLN
2.0000 g | Freq: Three times a day (TID) | INTRAVENOUS | Status: DC
  Administered 2020-05-08 – 2020-05-10 (×7): 2 g via INTRAVENOUS
  Filled 2020-05-07 (×8): qty 2

## 2020-05-07 MED ORDER — ONDANSETRON HCL 4 MG PO TABS: 4.0000 mg | ORAL_TABLET | Freq: Four times a day (QID) | ORAL | Status: DC | PRN

## 2020-05-07 MED ORDER — SODIUM CHLORIDE 0.9 % IV SOLN
2.0000 g | Freq: Once | INTRAVENOUS | Status: AC
  Administered 2020-05-07: 2 g via INTRAVENOUS
  Filled 2020-05-07: qty 2

## 2020-05-07 MED ORDER — ACETAMINOPHEN 500 MG PO TABS
1000.0000 mg | ORAL_TABLET | Freq: Once | ORAL | Status: AC
  Administered 2020-05-07: 1000 mg via ORAL
  Filled 2020-05-07: qty 2

## 2020-05-07 NOTE — ED Provider Notes (Signed)
Pt care assumed at 1600. Patient with history of lung cancer currently on chemotherapy here for evaluation of fever to 101.9 at home. Labs and x-rays pending.  CBC with ANC of 1200. UA not consistent with UTI. Chest x-ray stable, no evidence of pneumonia. Discussed with Dr. Lorenso Courier, with oncology. He recommends observation admission with cefepime Q8 hours and follow cultures. Patient updated findings of studies recommendation for mission and she is in agreement treatment plan. Hospitalist consulted for admission.   Quintella Reichert, MD 05/07/20 2056

## 2020-05-07 NOTE — H&P (Signed)
History and Physical    Valerie Long MVE:[id number] DOB: Feb 03, 1946 DOA: 05/07/2020  PCP: Colon Branch, MD  Patient coming from: Home  I have personally briefly reviewed patient's old medical records in [place]  Chief Complaint: Fever  HPI: Valerie Long is a 74 y.o. female with medical history significant of NSCLC stage 4, long term survivor on chronic chemotherapy for years now with Dr. Earlie Server.  Most recent chemo cycle was 11/24.  Pt presents to ED with c/o fevers at home up to 101.9.  Has cough as ongoing issue, maybe worse over past couple of days.  Has had COVID vaccine, no sick contacts.  No skin wounds, rashes, or changes.  No dysuria, no vomiting nor diarrhea.   ED Course: ANC 1200.  CXR shows no change from prior exam.  Remainder of workup neg for obvious source.   Review of Systems: As per HPI, otherwise all review of systems negative.  Past Medical History:  Diagnosis Date  . Adenocarcinoma of right lung, stage 4 ([place]) 2017  . Anemia   . Arthritis   . Bone metastases ([place]) 02/24/2017  . Encounter for antineoplastic chemotherapy 03/17/2016  . Hypercholesterolemia   . Hypertension 06/18/2016  . Osteopenia   . 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 Pneumonia   . Shortness of breath dyspnea     Past Surgical History:  Procedure Laterality Date  . CESAREAN SECTION     myomectomy  . COLONOSCOPY W/ POLYPECTOMY    . IR GENERIC HISTORICAL  08/17/2016   IR US GUIDE VASC ACCESS RIGHT 08/17/2016 [name], MD [facility]  . IR GENERIC HISTORICAL  08/17/2016   IR FLUORO GUIDE PORT INSERTION RIGHT 08/17/2016 [name], MD [facility]  . MEDIASTINOSCOPY N/A 09/25/2015   Procedure: MEDIASTINOSCOPY;  Surgeon: Melrose Nakayama, MD;  Location: [facility];  Service: Thoracic;  Laterality: N/A;  . VIDEO BRONCHOSCOPY Bilateral 08/19/2015   Procedure: VIDEO BRONCHOSCOPY WITH FLUORO;  Surgeon: Rigoberto Noel, MD;  Location: [facility];   Service: Cardiopulmonary;  Laterality: Bilateral;  . VIDEO BRONCHOSCOPY N/A 09/08/2015   Procedure: VIDEO BRONCHOSCOPY WITH FLUORO;  Surgeon: Rigoberto Noel, MD;  Location: [facility];  Service: Thoracic;  Laterality: N/A;  . VIDEO BRONCHOSCOPY WITH ENDOBRONCHIAL ULTRASOUND Right 09/08/2015   Procedure: ATTEMPTED VIDEO BRONCHOSCOPY ENDOBRONCHIAL ULTRASOUND  ;  Surgeon: Rigoberto Noel, MD;  Location: [facility];  Service: Thoracic;  Laterality: Right;     reports that she has never smoked. She has never used smokeless tobacco. She reports current alcohol use. She reports that she does not use drugs.  No Known Allergies  Family History  Problem Relation Age of Onset  . Hypertension Mother        F and M  . CAD Father   . Diabetes Other        auncle-aunts   . Cancer Other        UTERINE   . Breast cancer Sister   . Stroke Neg Hx   . Colon cancer Neg Hx      Prior to Admission medications   Medication Sig Start Date End Date Taking? Authorizing Provider  Cholecalciferol (VITAMIN D3 PO) Take 2 tablets by mouth daily.   Yes [provider]  Ferrous Sulfate (IRON PO) Take 1 tablet by mouth daily.   Yes [provider]  folic acid (FOLVITE) 1 MG tablet Take 1 tablet (1 mg total) by mouth daily. 03/28/20  Yes Heilingoetter, Cassandra L, PA-C  benzonatate (TESSALON) 100 MG capsule TAKE 1 CAPSULE BY MOUTH THREE TIMES A DAY AS NEEDED FOR COUGH Patient not taking: Reported on 05/07/2020 04/16/20   Heilingoetter, Cassandra L, PA-C  benzonatate (TESSALON) 200 MG capsule TAKE 1 CAPSULE BY MOUTH THREE TIMES A DAY AS NEEDED FOR COUGH Patient not taking: Reported on 05/07/2020 04/16/20   Colon Branch, MD  budesonide-formoterol [facility]) 80-4.5 MCG/ACT inhaler Inhale 2 puffs into the lungs 2 (two) times daily. Patient not taking: Reported on 05/07/2020 12/03/19   Saguier, Percell Miller, PA-C  dexamethasone (DECADRON) 4 MG tablet Take 4mg  by mouth twice daily the day before, of, and after chemo Patient not  taking: Reported on 05/07/2020 04/18/20   Heilingoetter, Cassandra L, PA-C  gabapentin (NEURONTIN) 100 MG capsule Take 2 capsules (200 mg total) by mouth 3 (three) times daily. Patient not taking: Reported on 05/07/2020 01/17/20   Curt Bears, MD  HYDROcodone-homatropine [facility]) 5-1.5 MG/5ML syrup Take 5 mLs by mouth every 6 (six) hours as needed for cough. Patient not taking: Reported on 05/07/2020 11/23/19   Saguier, Percell Miller, PA-C  ketoconazole (NIZORAL) 2 % cream APPLY TO AFFECTED AREA EVERY DAY Patient not taking: Reported on 05/07/2020 04/16/20   Colon Branch, MD  levocetirizine (XYZAL) 5 MG tablet Take 1 tablet (5 mg total) by mouth every evening. Patient not taking: Reported on 05/07/2020 02/13/20   Colon Branch, MD  lidocaine-prilocaine (EMLA) cream Apply 1 application topically as needed. Apply 1-2 tsp over port site 1-2 hours prior to chemo. Patient not taking: Reported on 05/07/2020 04/18/20   Heilingoetter, Cassandra L, PA-C  meloxicam (MOBIC) 15 MG tablet TAKE 1 TABLET BY MOUTH EVERY DAY AS NEEDED FOR PAIN Patient not taking: Reported on 05/07/2020 09/20/18   Heilingoetter, Cassandra L, PA-C  triamcinolone (KENALOG) 0.025 % ointment Apply 1 application topically 3 (three) times daily. Patient not taking: Reported on 05/07/2020 11/23/19   Saguier, Percell Miller, PA-C  triamcinolone lotion (KENALOG) 0.1 % Apply 1 application topically 3 (three) times daily. Patient not taking: Reported on 05/07/2020 12/21/19   Colon Branch, MD  folic acid (FOLVITE) 1 MG tablet Take 1 tablet (1 mg total) by mouth daily. 11/12/19   Heilingoetter, Cassandra L, PA-C    Physical Exam: Vitals:   05/07/20 2020 05/07/20 2100 05/07/20 2130 05/07/20 2200  BP: 121/72 114/70 124/68 132/73  Pulse: 93 90 88 87  Resp: (!) 22 20 10  (!) 9  Temp:    97.9 F (36.6 C)  TempSrc:    Oral  SpO2: 98% 100% 100% 100%  Weight:      Height:        Constitutional: NAD, calm, comfortable Eyes: PERRL, lids and conjunctivae normal ENMT:  Mucous membranes are moist. Posterior pharynx clear of any exudate or lesions.Normal dentition.  Neck: normal, supple, no masses, no thyromegaly Respiratory: clear to auscultation bilaterally, no wheezing, no crackles. Normal respiratory effort. No accessory muscle use.  Cardiovascular: Regular rate and rhythm, no murmurs / rubs / gallops. No extremity edema. 2+ pedal pulses. No carotid bruits.  Abdomen: no tenderness, no masses palpated. No hepatosplenomegaly. Bowel sounds positive.  Musculoskeletal: no clubbing / cyanosis. No joint deformity upper and lower extremities. Good ROM, no contractures. Normal muscle tone.  Skin: no rashes, lesions, ulcers. No induration Neurologic: CN 2-12 grossly intact. Sensation intact, DTR normal. Strength 5/5 in all 4.  Psychiatric: Normal judgment and insight. Alert and oriented x 3. Normal mood.    Labs on Admission:  I have personally reviewed following labs and imaging studies  CBC: Recent Labs  Lab 05/07/20 1541  WBC 2.0*  NEUTROABS 1.2*  HGB 9.3*  HCT 29.2*  MCV 100.3*  PLT 213   Basic Metabolic Panel: Recent Labs  Lab 05/07/20 1541  NA 135  K 4.6  CL 98  CO2 26  GLUCOSE 106*  BUN 22  CREATININE 1.01*  CALCIUM 9.7   GFR: Estimated Creatinine Clearance: 45.6 mL/min (A) (by C-G formula based on SCr of 1.01 mg/dL (H)). Liver Function Tests: Recent Labs  Lab 05/07/20 1541  AST 28  ALT 14  ALKPHOS 73  BILITOT 0.9  PROT 7.4  ALBUMIN 3.4*   No results for input(s): LIPASE, AMYLASE in the last 168 hours. No results for input(s): AMMONIA in the last 168 hours. Coagulation Profile: Recent Labs  Lab 05/07/20 1541  INR 1.0   Cardiac Enzymes: No results for input(s): CKTOTAL, CKMB, CKMBINDEX, TROPONINI in the last 168 hours. BNP (last 3 results) No results for input(s): PROBNP in the last 8760 hours. HbA1C: No results for input(s): HGBA1C in the last 72 hours. CBG: No results for input(s): GLUCAP in the last 168 hours. Lipid  Profile: No results for input(s): CHOL, HDL, LDLCALC, TRIG, CHOLHDL, LDLDIRECT in the last 72 hours. Thyroid Function Tests: No results for input(s): TSH, T4TOTAL, FREET4, T3FREE, THYROIDAB in the last 72 hours. Anemia Panel: No results for input(s): VITAMINB12, FOLATE, FERRITIN, TIBC, IRON, RETICCTPCT in the last 72 hours. Urine analysis:    Component Value Date/Time   COLORURINE YELLOW 05/07/2020 1817   APPEARANCEUR CLEAR 05/07/2020 1817   LABSPEC 1.014 05/07/2020 1817   PHURINE 6.0 05/07/2020 1817   GLUCOSEU NEGATIVE 05/07/2020 1817   HGBUR NEGATIVE 05/07/2020 1817   BILIRUBINUR NEGATIVE 05/07/2020 1817   BILIRUBINUR negative 04/21/2020 1155   KETONESUR NEGATIVE 05/07/2020 1817   PROTEINUR NEGATIVE 05/07/2020 1817   UROBILINOGEN negative (A) 04/21/2020 1155   UROBILINOGEN 0.2 08/02/2014 0327   NITRITE NEGATIVE 05/07/2020 1817   LEUKOCYTESUR NEGATIVE 05/07/2020 1817    Radiological Exams on Admission: DG Chest Port 1 View  Result Date: 05/07/2020 CLINICAL DATA:  Fever and cough for 2 weeks. EXAM: PORTABLE CHEST 1 VIEW COMPARISON:  04/21/2020 and older studies. FINDINGS: Masslike opacity at the right lung base, adjacent linear opacities and associated right pleural effusion are stable from the prior study. Remainder of the right lung is clear. Subtle small opacity in the left mid lung likely reflects 1 of the nodules seen on the previous CT, 02/25/2020. Left lung is otherwise clear. No convincing left pleural effusion. No pneumothorax. Cardiac silhouette normal in size.  No mediastinal or hilar masses. Right anterior chest wall Port-A-Cath is stable. No acute skeletal abnormality. Sclerotic lesions noted on the prior CT are not visualized on the AP portable semi-erect study. IMPRESSION: 1. No significant change from the most recent prior exam, allowing for differences in patient positioning and technique. 2. The right lung base mass is without substantial change. Smaller lung nodules  noted on the prior chest CT are not well visualized on the current portable chest radiograph. Stable right pleural effusion. Electronically Signed   By: Lajean Manes M.D.   On: 05/07/2020 16:35    EKG: Independently reviewed.  Assessment/Plan Principal Problem:   Febrile neutropenia (HCC) Active Problems:   Adenocarcinoma of right lung, stage 4 (HCC)    1. Febrile neutropenia - 1. BCx pending 2. Cefepime 2g Q8H IV 3. Not on g-CSF with chemo that I can  see 4. Spoke with Dr. Lorenso Courier: 1. Apparently g-CSF doesn't improve outcome in febrile neutropenia.  Didn't recommend giving it at this time. 2. Defer this decision to Dr. Earlie Server when he sees her in AM.  DVT prophylaxis: Lovenox Code Status: Full Family Communication: Family at bedside Disposition Plan: Home after treatment Consults called: None Admission status: Admit to inpatient  Severity of Illness: The appropriate patient status for this patient is INPATIENT. Inpatient status is judged to be reasonable and necessary in order to provide the required intensity of service to ensure the patient's safety. The patient's presenting symptoms, physical exam findings, and initial radiographic and laboratory data in the context of their chronic comorbidities is felt to place them at high risk for further clinical deterioration. Furthermore, it is not anticipated that the patient will be medically stable for discharge from the hospital within 2 midnights of admission. The following factors support the patient status of inpatient.   IP status for treatment of febrile neutropenia.   * I certify that at the point of admission it is my clinical judgment that the patient will require inpatient hospital care spanning beyond 2 midnights from the point of admission due to high intensity of service, high risk for further deterioration and high frequency of surveillance required.*    Reza Crymes M. DO [facility]  How to contact the  [facility] Attending or Consulting provider [place] or covering provider during after hours [place], for this patient?  1. Check the care team in [facility] and look for a) attending/consulting [facility] provider listed and b) the [facility] team listed 2. Log into [email or web address]  Amion Physician Scheduling and messaging for groups and whole hospitals  On call and physician scheduling software for group practices, residents, hospitalists and other medical providers for call, clinic, rotation and shift schedules. On[employer] is a hospital-wide system for scheduling doctors and paging doctors on call. EasyPlot is for scientific plotting and data analysis.  [email or web address]  and use [place]'s universal password to access. If you do not have the password, please contact the hospital operator.  3. Locate the [facility] provider you are looking for under [facility] and page to a number that you can be directly reached. 4. If you still have difficulty reaching the provider, please page the [facility] (Director on Call) for the Hospitalists listed on amion for assistance.  05/07/2020, 10:10 PM

## 2020-05-07 NOTE — Telephone Encounter (Signed)
Needs an appt

## 2020-05-07 NOTE — ED Provider Notes (Signed)
Valerie Long   CSN: [id number] Arrival date & time: 05/07/20  1511     History Chief Complaint  Patient presents with  . Fever    Valerie Long is a 74 y.o. female.  74 yo F with with a chief complaints of a fever.  This has been going on for a couple weeks.  Seems to coincide with when she gets her cancer therapy.  Had her last infusion about a week ago but then developed a fever.  As high as 102 at home.  Having cough this been an ongoing issue but worsening over a few days.  She denies any wounds or sores.  Denies urinary symptoms.  Has had some transient abdominal pain but nothing continual.  No nausea vomiting or diarrhea.  The history is provided by the patient.  Fever Associated symptoms: cough   Associated symptoms: no chest pain, no chills, no congestion, no dysuria, no headaches, no myalgias, no nausea, no rhinorrhea and no vomiting   Illness Severity:  Moderate Onset quality:  Gradual Duration:  2 days Timing:  Constant Progression:  Worsening Chronicity:  New Associated symptoms: cough and fever   Associated symptoms: no chest pain, no congestion, no headaches, no myalgias, no nausea, no rhinorrhea, no shortness of breath, no vomiting and no wheezing        Past Medical History:  Diagnosis Date  . Adenocarcinoma of right lung, stage 4 ([facility]) 2017  . Anemia   . Arthritis   . Bone metastases ([place]) 02/24/2017  . Encounter for antineoplastic chemotherapy 03/17/2016  . Hypercholesterolemia   . Hypertension 06/18/2016  . Osteopenia   . 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 Pneumonia   . Shortness of breath dyspnea     Patient Active Problem List   Diagnosis Date Noted  . Pleural effusion on right 12/06/2019  . Port-A-Cath in place 01/12/2018  . Bone metastases ([place]) 02/24/2017  . 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 Hypertension 06/18/2016  . Encounter for antineoplastic  chemotherapy 03/17/2016  . Adenocarcinoma of right lung, stage 4 ([place])   . PCP NOTES >>> 03/22/2015  . CTS (carpal tunnel syndrome) 12/06/2013  . Annual physical exam 08/29/2012  . Pain in joint, shoulder region 08/29/2012  . Vaginal atrophy 06/17/2011  . Osteopenia   . Hypercholesterolemia     Past Surgical History:  Procedure Laterality Date  . CESAREAN SECTION     myomectomy  . COLONOSCOPY W/ POLYPECTOMY    . IR GENERIC HISTORICAL  08/17/2016   IR US GUIDE VASC ACCESS RIGHT 08/17/2016 [name], MD [facility]  . IR GENERIC HISTORICAL  08/17/2016   IR FLUORO GUIDE PORT INSERTION RIGHT 08/17/2016 [name], MD [facility]  . MEDIASTINOSCOPY N/A 09/25/2015   Procedure: MEDIASTINOSCOPY;  Surgeon: Melrose Nakayama, MD;  Location: [place];  Service: Thoracic;  Laterality: N/A;  . VIDEO BRONCHOSCOPY Bilateral 08/19/2015   Procedure: VIDEO BRONCHOSCOPY WITH FLUORO;  Surgeon: Rigoberto Noel, MD;  Location: [place];  Service: Cardiopulmonary;  Laterality: Bilateral;  . VIDEO BRONCHOSCOPY N/A 09/08/2015   Procedure: VIDEO BRONCHOSCOPY WITH FLUORO;  Surgeon: Rigoberto Noel, MD;  Location: [place];  Service: Thoracic;  Laterality: N/A;  . VIDEO BRONCHOSCOPY WITH ENDOBRONCHIAL ULTRASOUND Right 09/08/2015   Procedure: ATTEMPTED VIDEO BRONCHOSCOPY ENDOBRONCHIAL ULTRASOUND  ;  Surgeon: Rigoberto Noel, MD;  Location: [place];  Service: Thoracic;  Laterality: Right;     OB History  Gravida  1   Para  0   Term      Preterm      AB  1   Living  0     SAB  1   TAB      Ectopic      Multiple      Live Births              Family History  Problem Relation Age of Onset  . Hypertension Mother        F and M  . CAD Father   . Diabetes Other        auncle-aunts   . Cancer Other        UTERINE   . Breast cancer Sister   . Stroke Neg Hx   . Colon cancer Neg Hx     Social History   Tobacco Use  . Smoking status: Never Smoker  . Smokeless tobacco: Never Used    Vaping Use  . Vaping Use: Never used  Substance Use Topics  . Alcohol use: Yes    Alcohol/week: 0.0 standard drinks    Comment: SOCIAL  . Drug use: No    Home Medications Prior to Admission medications   Medication Sig Start Date End Date Taking? Authorizing Provider  acetaminophen (TYLENOL) 500 MG tablet Take 1,000 mg by mouth every 6 (six) hours as needed for moderate pain or headache.    [provider]  benzonatate (TESSALON) 100 MG capsule TAKE 1 CAPSULE BY MOUTH THREE TIMES A DAY AS NEEDED FOR COUGH 04/16/20   Heilingoetter, Cassandra L, PA-C  benzonatate (TESSALON) 200 MG capsule TAKE 1 CAPSULE BY MOUTH THREE TIMES A DAY AS NEEDED FOR COUGH 04/16/20   Colon Branch, MD  budesonide-formoterol [facility]) 80-4.5 MCG/ACT inhaler Inhale 2 puffs into the lungs 2 (two) times daily. 12/03/19   Saguier, Percell Miller, PA-C  Calcium Carb-Cholecalciferol (CALCIUM 600/VITAMIN D3 PO) Take by mouth.    [provider]  Cyanocobalamin (B-12 PO) Take 2 tablets by mouth daily.    [provider]  dexamethasone (DECADRON) 4 MG tablet Take 4mg  by mouth twice daily the day before, of, and after chemo 04/18/20   Heilingoetter, Cassandra L, PA-C  Fish Oil-Cholecalciferol (OMEGA-3 + VITAMIN D3 PO) Take 1 tablet by mouth daily.    [provider]  folic acid (FOLVITE) 1 MG tablet Take 1 tablet (1 mg total) by mouth daily. 03/28/20   Heilingoetter, Cassandra L, PA-C  gabapentin (NEURONTIN) 100 MG capsule Take 2 capsules (200 mg total) by mouth 3 (three) times daily. 01/17/20   Curt Bears, MD  HYDROcodone-homatropine [facility]) 5-1.5 MG/5ML syrup Take 5 mLs by mouth every 6 (six) hours as needed for cough. 11/23/19   Saguier, Percell Miller, PA-C  ketoconazole (NIZORAL) 2 % cream APPLY TO AFFECTED AREA EVERY DAY 04/16/20   Colon Branch, MD  levocetirizine (XYZAL) 5 MG tablet Take 1 tablet (5 mg total) by mouth every evening. 02/13/20   Colon Branch, MD  lidocaine-prilocaine (EMLA) cream  Apply 1 application topically as needed. Apply 1-2 tsp over port site 1-2 hours prior to chemo. 04/18/20   Heilingoetter, Cassandra L, PA-C  meloxicam (MOBIC) 15 MG tablet TAKE 1 TABLET BY MOUTH EVERY DAY AS NEEDED FOR PAIN 09/20/18   Heilingoetter, Cassandra L, PA-C  ondansetron (ZOFRAN) 8 MG tablet Take 8 mg by mouth every 8 (eight) hours as needed.  11/14/15   [provider]  OVER THE COUNTER MEDICATION Apply 1  drop topically daily as needed. 1 drop CBD oil in vicks vapor rub . Applied to right thigh for pain relief.    [provider]  PRESCRIPTION MEDICATION Inject 1 Dose as directed every 21 ( twenty-one) days. Chemo    [provider]  triamcinolone (KENALOG) 0.025 % ointment Apply 1 application topically 3 (three) times daily. 11/23/19   Saguier, Percell Miller, PA-C  triamcinolone lotion (KENALOG) 0.1 % Apply 1 application topically 3 (three) times daily. 12/21/19   Colon Branch, MD  folic acid (FOLVITE) 1 MG tablet Take 1 tablet (1 mg total) by mouth daily. 11/12/19   Heilingoetter, Cassandra L, PA-C    Allergies    Patient has no known allergies.  Review of Systems   Review of Systems  Constitutional: Positive for fever. Negative for chills.  HENT: Negative for congestion and rhinorrhea.   Eyes: Negative for redness and visual disturbance.  Respiratory: Positive for cough. Negative for shortness of breath and wheezing.   Cardiovascular: Negative for chest pain and palpitations.  Gastrointestinal: Negative for nausea and vomiting.  Genitourinary: Negative for dysuria and urgency.  Musculoskeletal: Negative for arthralgias and myalgias.  Skin: Negative for pallor and wound.  Neurological: Negative for dizziness and headaches.    Physical Exam Updated Vital Signs BP 129/77   Pulse (!) 113   Temp 98.3 F (36.8 C) (Oral)   Resp 18   Ht 5\' 3"  (1.6 m)   Wt 65 kg   LMP 06/09/1998   SpO2 97%   BMI 25.38 kg/m   Physical Exam Vitals and nursing Long reviewed.    Constitutional:      General: She is not in acute distress.    Appearance: She is well-developed. She is not diaphoretic.  HENT:     Head: Normocephalic and atraumatic.  Eyes:     Pupils: Pupils are equal, round, and reactive to light.  Cardiovascular:     Rate and Rhythm: Normal rate and regular rhythm.     Heart sounds: No murmur heard.  No friction rub. No gallop.   Pulmonary:     Effort: Pulmonary effort is normal.     Breath sounds: No wheezing or rales.  Abdominal:     General: There is no distension.     Palpations: Abdomen is soft.     Tenderness: There is no abdominal tenderness.  Musculoskeletal:        General: No tenderness.     Cervical back: Normal range of motion and neck supple.  Skin:    General: Skin is warm and dry.  Neurological:     Mental Status: She is alert and oriented to person, place, and time.  Psychiatric:        Behavior: Behavior normal.     ED Results / Procedures / Treatments   Labs (all labs ordered are listed, but only abnormal results are displayed) Labs Reviewed  COMPREHENSIVE METABOLIC PANEL - Abnormal; Notable for the following components:      Result Value   Glucose, Bld 106 (*)    Creatinine, Ser 1.01 (*)    Albumin 3.4 (*)    GFR, Estimated 59 (*)    All other components within normal limits  CBC WITH DIFFERENTIAL/PLATELET - Abnormal; Notable for the following components:   WBC 2.0 (*)    RBC 2.91 (*)    Hemoglobin 9.3 (*)    HCT 29.2 (*)    MCV 100.3 (*)    All other components within normal limits  URINE CULTURE  CULTURE, BLOOD (ROUTINE X 2)  CULTURE, BLOOD (ROUTINE X 2)  RESP PANEL BY RT-PCR (FLU A&B, COVID) ARPGX2  LACTIC ACID, PLASMA  PROTIME-INR  APTT  LACTIC ACID, PLASMA  URINALYSIS, ROUTINE W REFLEX MICROSCOPIC    EKG EKG Interpretation  Date/Time:  Wednesday May 07 2020 15:51:24 EST Ventricular Rate:  113 PR Interval:    QRS Duration: 84 QT Interval:  306 QTC Calculation: 420 R  Axis:   31 Text Interpretation: Sinus tachycardia Low voltage, extremity and precordial leads No significant change since last tracing Confirmed by Deno Etienne [phone]) on 05/07/2020 3:58:06 PM   Radiology DG Chest Port 1 View  Result Date: 05/07/2020 CLINICAL DATA:  Fever and cough for 2 weeks. EXAM: PORTABLE CHEST 1 VIEW COMPARISON:  04/21/2020 and older studies. FINDINGS: Masslike opacity at the right lung base, adjacent linear opacities and associated right pleural effusion are stable from the prior study. Remainder of the right lung is clear. Subtle small opacity in the left mid lung likely reflects 1 of the nodules seen on the previous CT, 02/25/2020. Left lung is otherwise clear. No convincing left pleural effusion. No pneumothorax. Cardiac silhouette normal in size.  No mediastinal or hilar masses. Right anterior chest wall Port-A-Cath is stable. No acute skeletal abnormality. Sclerotic lesions noted on the prior CT are not visualized on the AP portable semi-erect study. IMPRESSION: 1. No significant change from the most recent prior exam, allowing for differences in patient positioning and technique. 2. The right lung base mass is without substantial change. Smaller lung nodules noted on the prior chest CT are not well visualized on the current portable chest radiograph. Stable right pleural effusion. Electronically Signed   By: Lajean Manes M.D.   On: 05/07/2020 16:35    Procedures Procedures (including critical care time)  Medications Ordered in ED Medications  sodium chloride 0.9 % bolus 1,000 mL (has no administration in time range)  acetaminophen (TYLENOL) tablet 1,000 mg (has no administration in time range)    ED Course  I have reviewed the triage vital signs and the nursing notes.  Pertinent labs & imaging results that were available during my care of the patient were reviewed by me and considered in my medical decision making (see chart for details).    MDM  Rules/Calculators/A&P                          74 yo F with a chief complaints of fever.  Patient is undergoing chemotherapy for lung cancer.  She is concerned with persistent fever.  Had called her oncologist but did not get a return call.  Having a cough that has been a chronic problem mildly worsening over the past few days.  Will obtain a laboratory evaluation chest x-ray UA reassess.  Patient signed out to Dr. Ralene Bathe, please see her Long for further details care in ED.  The patients results and plan were reviewed and discussed.   Any x-rays performed were independently reviewed by myself.   Differential diagnosis were considered with the presenting HPI.  Medications  sodium chloride 0.9 % bolus 1,000 mL (has no administration in time range)  acetaminophen (TYLENOL) tablet 1,000 mg (has no administration in time range)    Vitals:   05/07/20 1523 05/07/20 1526 05/07/20 1620  BP: 137/86  129/77  Pulse: 66  (!) 113  Resp: 20  18  Temp: 98.3 F (36.8 C)    TempSrc: Oral  SpO2: 100%  97%  Weight:  65 kg   Height:  5\' 3"  (1.6 m)     Final diagnoses:  Fever in adult    Admission/ observation were discussed with the admitting physician, patient and/or family and they are comfortable with the plan.    Final Clinical Impression(s) / ED Diagnoses Final diagnoses:  Fever in adult    Rx / DC Orders ED Discharge Orders    None       Deno Etienne, DO 05/07/20 1643

## 2020-05-07 NOTE — ED Triage Notes (Signed)
Temp max 101.6 @ home, productive cough since Sunday.

## 2020-05-07 NOTE — Telephone Encounter (Signed)
Caller states she has a fever on and off for over two weeks now, patient would like to know what to do. She is requesting to speak with nurse.

## 2020-05-08 DIAGNOSIS — D709 Neutropenia, unspecified: Principal | ICD-10-CM

## 2020-05-08 DIAGNOSIS — R5081 Fever presenting with conditions classified elsewhere: Secondary | ICD-10-CM | POA: Diagnosis not present

## 2020-05-08 LAB — CBC
HCT: 24 % — ABNORMAL LOW (ref 36.0–46.0)
Hemoglobin: 7.7 g/dL — ABNORMAL LOW (ref 12.0–15.0)
MCH: 32 pg (ref 26.0–34.0)
MCHC: 32.1 g/dL (ref 30.0–36.0)
MCV: 99.6 fL (ref 80.0–100.0)
Platelets: 123 10*3/uL — ABNORMAL LOW (ref 150–400)
RBC: 2.41 MIL/uL — ABNORMAL LOW (ref 3.87–5.11)
RDW: 15.1 % (ref 11.5–15.5)
WBC: 1.3 10*3/uL — CL (ref 4.0–10.5)
nRBC: 0 % (ref 0.0–0.2)

## 2020-05-08 LAB — BASIC METABOLIC PANEL
Anion gap: 8 (ref 5–15)
BUN: 19 mg/dL (ref 8–23)
CO2: 23 mmol/L (ref 22–32)
Calcium: 8.6 mg/dL — ABNORMAL LOW (ref 8.9–10.3)
Chloride: 103 mmol/L (ref 98–111)
Creatinine, Ser: 0.81 mg/dL (ref 0.44–1.00)
GFR, Estimated: >60 mL/min (ref >60)
Glucose, Bld: 95 mg/dL (ref 70–99)
Potassium: 4.6 mmol/L (ref 3.5–5.1)
Sodium: 134 mmol/L — ABNORMAL LOW (ref 135–145)

## 2020-05-08 MED ORDER — VITAMIN D 25 MCG (1000 UNIT) PO TABS
1000.0000 [IU] | ORAL_TABLET | Freq: Every day | ORAL | Status: DC
  Administered 2020-05-08 – 2020-05-10 (×3): 1000 [IU] via ORAL
  Filled 2020-05-08 (×3): qty 1

## 2020-05-08 MED ORDER — FOLIC ACID 1 MG PO TABS
1.0000 mg | ORAL_TABLET | Freq: Every day | ORAL | Status: DC
  Administered 2020-05-08 – 2020-05-10 (×3): 1 mg via ORAL
  Filled 2020-05-08 (×3): qty 1

## 2020-05-08 MED ORDER — FERROUS SULFATE 325 (65 FE) MG PO TABS
ORAL_TABLET | Freq: Every day | ORAL | Status: DC
  Administered 2020-05-08 – 2020-05-10 (×3): 325 mg via ORAL
  Filled 2020-05-08 (×3): qty 1

## 2020-05-08 MED ORDER — SODIUM CHLORIDE 0.9 % IV SOLN
INTRAVENOUS | Status: DC | PRN
  Administered 2020-05-08: 250 mL via INTRAVENOUS

## 2020-05-08 NOTE — Progress Notes (Signed)
Initial Nutrition Assessment  INTERVENTION:   -Ensure Enlive po BID, each supplement provides 350 kcal and 20 grams of protein  NUTRITION DIAGNOSIS:   Increased nutrient needs related to cancer and cancer related treatments as evidenced by estimated needs.  GOAL:   Patient will meet greater than or equal to 90% of their needs  MONITOR:   PO intake, Supplement acceptance, Labs, Weight trends, I & O's  REASON FOR ASSESSMENT:   Malnutrition Screening Tool    ASSESSMENT:   74 y.o. female with medical history significant of NSCLC stage 4, long term survivor on chronic chemotherapy for years now with Dr. Earlie Server.  Most recent chemo cycle was 11/24.  Patient with poor appetite r/t chemotherapy. Pt has had a fever for about a week now following her last chemo treatment. During her last treatment, pt was seen by Cancer center RD who provided diet education and recommended pt start protein supplements at home. Ensure supplements have been ordered.   Per weight records, pt has lost 11 lbs since 7/22 (7% wt loss x 4.5 months, insignificant for time frame). Weight is trending down.   Medications reviewed.   Labs reviewed: Low Na  NUTRITION - FOCUSED PHYSICAL EXAM:  Unable to complete  Diet Order:   Diet Order            Diet regular Room service appropriate? Yes; Fluid consistency: Thin  Diet effective now                 EDUCATION NEEDS:   No education needs have been identified at this time  Skin:  Skin Assessment: Reviewed RN Assessment  Last BM:  12/1  Height:   Ht Readings from Last 1 Encounters:  05/07/20 5\' 3"  (1.6 m)    Weight:   Wt Readings from Last 1 Encounters:  05/07/20 62.9 kg   BMI:  Body mass index is 24.56 kg/m.  Estimated Nutritional Needs:   Kcal:  1850-2050  Protein:  85-100g  Fluid:  2L/day  Clayton Bibles, MS, RD, LDN Inpatient Clinical Dietitian Contact information available via Amion

## 2020-05-08 NOTE — Progress Notes (Signed)
PROGRESS NOTE    Valerie Long  FAO:[id number] DOB: 27-Apr-1946 DOA: 05/07/2020 PCP: Colon Branch, MD    Brief Narrative:  74 year old female with history of non-small cell lung cancer stage IV, long-term survival on chronic chemotherapy, most recent chemotherapy on 11/24.  Presented to the ER with complains of fever up to 101.9 at home.  No other localizing symptoms.  In the emergency room temperature 101.  Absolute neutrophil count 1.2.  Chest x-ray normal.  COVID-19 negative.  Admitted for neutropenic fever.   Assessment & Plan:   Principal Problem:   Febrile neutropenia (HCC) Active Problems:   Adenocarcinoma of right lung, stage 4 (HCC)  Febrile neutropenia: No localizing source of infection.  Blood cultures drawn.  Started on cefepime that we will continue until clinical improvement. Continue monitoring for any localizing source of infection. ANC is low, she is day 8 post chemotherapy.  Probably at nadir.  Will be seen by oncology and they will decide whether patient will benefit with any G-CSF.  Stage IV lung cancer: 4 years on chemotherapy after radiation.  She is on her 72 cycle of chemo.  Functionally very well.   DVT prophylaxis: enoxaparin (LOVENOX) injection 40 mg Start: 05/07/20 2200   Code Status: Full code Family Communication: None Disposition Plan: Status is: Inpatient  Remains inpatient appropriate because:Inpatient level of care appropriate due to severity of illness   Dispo: The patient is from: Home              Anticipated d/c is to: Home              Anticipated d/c date is: 2 days              Patient currently is not medically stable to d/c.         Consultants:   Oncology  Procedures:   None  Antimicrobials:  Anti-infectives (From admission, onward)   Start     Dose/Rate Route Frequency Ordered Stop   05/08/20 0300  ceFEPIme (MAXIPIME) 2 g in sodium chloride 0.9 % 100 mL IVPB        2 g 200 mL/hr over 30 Minutes Intravenous Every 8  hours 05/07/20 2113     05/07/20 1845  ceFEPIme (MAXIPIME) 2 g in sodium chloride 0.9 % 100 mL IVPB        2 g 200 mL/hr over 30 Minutes Intravenous  Once 05/07/20 1832 05/07/20 1930         Subjective: Patient seen and examined.  Denies any complaints.  She was wondering whether she can continue her home doses of vitamin D and folic acid.  Febrile overnight with low-grade temperature.  Denies any nausea vomiting.  Objective: Vitals:   05/08/20 0228 05/08/20 0626 05/08/20 1011 05/08/20 1359  BP: 135/80 120/73 124/79 129/76  Pulse: 91 92 92 94  Resp: 16 16 20  (!) 21  Temp: 98.2 F (36.8 C) 98.2 F (36.8 C) 98.2 F (36.8 C) 98.4 F (36.9 C)  TempSrc: Oral Oral Oral Oral  SpO2: 98% 97% 95% 100%  Weight:      Height:        Intake/Output Summary (Last 24 hours) at 05/08/2020 1414 Last data filed at 05/08/2020 0644 Gross per 24 hour  Intake 1219.06 ml  Output --  Net 1219.06 ml   Filed Weights   05/07/20 1526 05/07/20 2233  Weight: 65 kg 62.9 kg    Examination:  General exam: Appears calm and comfortable, comfortable on room air.  Respiratory system: Clear to auscultation. Respiratory effort normal. Patient has a subcutaneous port on the left chest wall that is nontender and without any erythema. Cardiovascular system: S1 & S2 heard, RRR. No JVD, murmurs, rubs, gallops or clicks. No pedal edema. Gastrointestinal system: Abdomen is nondistended, soft and nontender. No organomegaly or masses felt. Normal bowel sounds heard. Central nervous system: Alert and oriented. No focal neurological deficits. Extremities: Symmetric 5 x 5 power. Skin: No rashes, lesions or ulcers Psychiatry: Judgement and insight appear normal. Mood & affect appropriate.     Data Reviewed: I have personally reviewed following labs and imaging studies  CBC: Recent Labs  Lab 05/07/20 1541 05/08/20 0612  WBC 2.0* 1.3*  NEUTROABS 1.2*  --   HGB 9.3* 7.7*  HCT 29.2* 24.0*  MCV 100.3* 99.6  PLT  165 448*   Basic Metabolic Panel: Recent Labs  Lab 05/07/20 1541 05/08/20 0612  NA 135 134*  K 4.6 4.6  CL 98 103  CO2 26 23  GLUCOSE 106* 95  BUN 22 19  CREATININE 1.01* 0.81  CALCIUM 9.7 8.6*   GFR: Estimated Creatinine Clearance: 56.1 mL/min (by C-G formula based on SCr of 0.81 mg/dL). Liver Function Tests: Recent Labs  Lab 05/07/20 1541  AST 28  ALT 14  ALKPHOS 73  BILITOT 0.9  PROT 7.4  ALBUMIN 3.4*   No results for input(s): LIPASE, AMYLASE in the last 168 hours. No results for input(s): AMMONIA in the last 168 hours. Coagulation Profile: Recent Labs  Lab 05/07/20 1541  INR 1.0   Cardiac Enzymes: No results for input(s): CKTOTAL, CKMB, CKMBINDEX, TROPONINI in the last 168 hours. BNP (last 3 results) No results for input(s): PROBNP in the last 8760 hours. HbA1C: No results for input(s): HGBA1C in the last 72 hours. CBG: No results for input(s): GLUCAP in the last 168 hours. Lipid Profile: No results for input(s): CHOL, HDL, LDLCALC, TRIG, CHOLHDL, LDLDIRECT in the last 72 hours. Thyroid Function Tests: No results for input(s): TSH, T4TOTAL, FREET4, T3FREE, THYROIDAB in the last 72 hours. Anemia Panel: No results for input(s): VITAMINB12, FOLATE, FERRITIN, TIBC, IRON, RETICCTPCT in the last 72 hours. Sepsis Labs: Recent Labs  Lab 05/07/20 1541 05/07/20 1822  LATICACIDVEN 1.2 1.0    Recent Results (from the past 240 hour(s))  Resp Panel by RT-PCR (Flu A&B, Covid) Nasopharyngeal Swab     Status: None   Collection Time: 05/07/20  4:00 PM   Specimen: Nasopharyngeal Swab; Nasopharyngeal(NP) swabs in vial transport medium  Result Value Ref Range Status   SARS Coronavirus 2 by RT PCR NEGATIVE NEGATIVE Final    Comment: (NOTE) SARS-CoV-2 target nucleic acids are NOT DETECTED.  The SARS-CoV-2 RNA is generally detectable in upper respiratory specimens during the acute phase of infection. The lowest concentration of SARS-CoV-2 viral copies this assay can  detect is 138 copies/mL. A negative result does not preclude SARS-Cov-2 infection and should not be used as the sole basis for treatment or other patient management decisions. A negative result may occur with  improper specimen collection/handling, submission of specimen other than nasopharyngeal swab, presence of viral mutation(s) within the areas targeted by this assay, and inadequate number of viral copies(<138 copies/mL). A negative result must be combined with clinical observations, patient history, and epidemiological information. The expected result is Negative.  Fact Sheet for Patients:  EntrepreneurPulse.com.au  Fact Sheet for Healthcare Providers:  IncredibleEmployment.be  This test is no t yet approved or cleared by the [place] and  has been authorized for detection and/or diagnosis of SARS-CoV-2 by FDA under an Emergency Use Authorization (EUA). This EUA will remain  in effect (meaning this test can be used) for the duration of the COVID-19 declaration under Section 564(b)(1) of the Act, 21 U.S.C.section 360bbb-3(b)(1), unless the authorization is terminated  or revoked sooner.       Influenza A by PCR NEGATIVE NEGATIVE Final   Influenza B by PCR NEGATIVE NEGATIVE Final    Comment: (NOTE) The Xpert Xpress SARS-CoV-2/FLU/RSV plus assay is intended as an aid in the diagnosis of influenza from Nasopharyngeal swab specimens and should not be used as a sole basis for treatment. Nasal washings and aspirates are unacceptable for Xpert Xpress SARS-CoV-2/FLU/RSV testing.  Fact Sheet for Patients: EntrepreneurPulse.com.au  Fact Sheet for Healthcare Providers: IncredibleEmployment.be  This test is not yet approved or cleared by the [place] FDA and has been authorized for detection and/or diagnosis of SARS-CoV-2 by FDA under an Emergency Use Authorization (EUA). This EUA will remain in  effect (meaning this test can be used) for the duration of the COVID-19 declaration under Section 564(b)(1) of the Act, 21 U.S.C. section 360bbb-3(b)(1), unless the authorization is terminated or revoked.  Performed at [facility], [place] [address], [place], [place] [address]   Blood culture (routine x 2)     Status: None (Preliminary result)   Collection Time: 05/07/20  4:01 PM   Specimen: BLOOD  Result Value Ref Range Status   Specimen Description   Final    BLOOD RIGHT ANTECUBITAL Performed at [place] [address], [place], [place] [address]    Special Requests   Final    BOTTLES DRAWN AEROBIC AND ANAEROBIC Blood Culture adequate volume Performed at [facility] [address], [place], [place] [address]    Culture   Final    NO GROWTH < 12 HOURS Performed at [facility] [address], [place], [place] [address]    Report Status PENDING  Incomplete  Blood culture (routine x 2)     Status: None (Preliminary result)   Collection Time: 05/07/20  5:00 PM   Specimen: BLOOD RIGHT FOREARM  Result Value Ref Range Status   Specimen Description   Final    BLOOD RIGHT FOREARM Performed at [facility] [address] [place], [place] [address]    Special Requests   Final    BOTTLES DRAWN AEROBIC AND ANAEROBIC Blood Culture adequate volume Performed at [facility] [address], [place], [place] [address]    Culture   Final    NO GROWTH < 12 HOURS Performed at [facility] [address], [place], [place] [address]    Report Status PENDING  Incomplete         Radiology Studies: DG Chest Port 1 View  Result Date: 05/07/2020 CLINICAL DATA:  Fever and cough for 2 weeks. EXAM: PORTABLE CHEST 1 VIEW COMPARISON:  04/21/2020 and older studies. FINDINGS: Masslike opacity at the right lung base, adjacent linear opacities and associated right pleural effusion are  stable from the prior study. Remainder of the right lung is clear. Subtle small opacity in the left mid lung likely reflects 1 of the nodules seen on the previous CT, 02/25/2020. Left lung is otherwise clear. No convincing left pleural effusion. No pneumothorax. Cardiac silhouette normal in size.  No mediastinal or hilar masses. Right anterior chest wall Port-A-Cath is stable. No acute skeletal abnormality.  Sclerotic lesions noted on the prior CT are not visualized on the AP portable semi-erect study. IMPRESSION: 1. No significant change from the most recent prior exam, allowing for differences in patient positioning and technique. 2. The right lung base mass is without substantial change. Smaller lung nodules noted on the prior chest CT are not well visualized on the current portable chest radiograph. Stable right pleural effusion. Electronically Signed   By: Lajean Manes M.D.   On: 05/07/2020 16:35        Scheduled Meds: . cholecalciferol  1,000 Units Oral Daily  . enoxaparin (LOVENOX) injection  40 mg Subcutaneous Q24H  . feeding supplement  237 mL Oral BID BM  . [START ON 05/09/2020] ferrous sulfate   Oral QAC breakfast  . folic acid  1 mg Oral Daily   Continuous Infusions: . sodium chloride Stopped (05/08/20 0500)  . ceFEPime (MAXIPIME) IV Stopped (05/08/20 0306)     LOS: 1 day    Time spent: 30 minutes    Barb Merino, MD [facility] Pager [phone]

## 2020-05-09 DIAGNOSIS — D709 Neutropenia, unspecified: Secondary | ICD-10-CM | POA: Diagnosis not present

## 2020-05-09 DIAGNOSIS — R5081 Fever presenting with conditions classified elsewhere: Secondary | ICD-10-CM | POA: Diagnosis not present

## 2020-05-09 LAB — CBC WITH DIFFERENTIAL/PLATELET
Abs Immature Granulocytes: 0.01 10*3/uL (ref 0.00–0.07)
Basophils Absolute: 0 10*3/uL (ref 0.0–0.1)
Basophils Relative: 1 %
Eosinophils Absolute: 0 10*3/uL (ref 0.0–0.5)
Eosinophils Relative: 2 %
HCT: 24.1 % — ABNORMAL LOW (ref 36.0–46.0)
Hemoglobin: 7.7 g/dL — ABNORMAL LOW (ref 12.0–15.0)
Immature Granulocytes: 1 %
Lymphocytes Relative: 40 %
Lymphs Abs: 0.5 10*3/uL — ABNORMAL LOW (ref 0.7–4.0)
MCH: 32.4 pg (ref 26.0–34.0)
MCHC: 32 g/dL (ref 30.0–36.0)
MCV: 101.3 fL — ABNORMAL HIGH (ref 80.0–100.0)
Monocytes Absolute: 0.3 10*3/uL (ref 0.1–1.0)
Monocytes Relative: 25 %
Neutro Abs: 0.4 10*3/uL — CL (ref 1.7–7.7)
Neutrophils Relative %: 31 %
Platelets: 111 10*3/uL — ABNORMAL LOW (ref 150–400)
RBC: 2.38 MIL/uL — ABNORMAL LOW (ref 3.87–5.11)
RDW: 15.1 % (ref 11.5–15.5)
WBC: 1.1 10*3/uL — CL (ref 4.0–10.5)
nRBC: 0 % (ref 0.0–0.2)

## 2020-05-09 LAB — URINE CULTURE

## 2020-05-09 MED ORDER — TBO-FILGRASTIM 300 MCG/0.5ML ~~LOC~~ SOSY
300.0000 ug | PREFILLED_SYRINGE | Freq: Every day | SUBCUTANEOUS | Status: DC
  Administered 2020-05-09: 300 ug via SUBCUTANEOUS
  Filled 2020-05-09 (×2): qty 0.5

## 2020-05-09 NOTE — Progress Notes (Signed)
PROGRESS NOTE    Valerie Long  TDS:[id number] DOB: [birth year] DOA: 05/07/2020 PCP: Colon Branch, MD    Brief Narrative:  74 year old female with history of non-small cell lung cancer stage IV, long-term survival on chronic chemotherapy, most recent chemotherapy on 11/24.  Presented to the ER with complains of fever up to 101.9 at home.  No other localizing symptoms.  In the emergency room temperature 101.  Absolute neutrophil count 1.2.  Chest x-ray normal.  COVID-19 negative.  Admitted for neutropenic fever.   Assessment & Plan:   Principal Problem:   Febrile neutropenia (HCC) Active Problems:   Adenocarcinoma of right lung, stage 4 (HCC)  Febrile neutropenia: No localizing source of infection.  Blood cultures negative.  Urine culture with no growth.  Started on cefepime that we will continue until clinical improvement. ANC 400. Day 9 post chemotherapy. Seen by oncology, started on Granix today. We will continue IV antibiotics until [place] more than thousand and afebrile.  Stage IV lung cancer: 4 years on chemotherapy after radiation.  She is on her 72 cycle of chemo.  Functionally very well.   DVT prophylaxis: enoxaparin (LOVENOX) injection 40 mg Start: 05/07/20 2200   Code Status: Full code Family Communication: None Disposition Plan: Status is: Inpatient  Remains inpatient appropriate because:Inpatient level of care appropriate due to severity of illness   Dispo: The patient is from: Home              Anticipated d/c is to: Home              Anticipated d/c date is: 2 days              Patient currently is not medically stable to d/c.         Consultants:   Oncology  Procedures:   None  Antimicrobials:  Anti-infectives (From admission, onward)   Start     Dose/Rate Route Frequency Ordered Stop   05/08/20 0300  ceFEPIme (MAXIPIME) 2 g in sodium chloride 0.9 % 100 mL IVPB        2 g 200 mL/hr over 30 Minutes Intravenous Every 8 hours 05/07/20 2113      05/07/20 1845  ceFEPIme (MAXIPIME) 2 g in sodium chloride 0.9 % 100 mL IVPB        2 g 200 mL/hr over 30 Minutes Intravenous  Once 05/07/20 1832 05/07/20 1930         Subjective: Patient seen and examined.  No overnight events.  Afebrile overnight.  She just does not feel well and feels weak. Objective: Vitals:   05/08/20 1359 05/08/20 2049 05/08/20 2115 05/09/20 0514  BP: 129/76 (!) 141/82 132/77 133/80  Pulse: 94 (!) 110 (!) 105 98  Resp: (!) 21 18  16   Temp: 98.4 F (36.9 C) 98.4 F (36.9 C)  98.2 F (36.8 C)  TempSrc: Oral Oral  Oral  SpO2: 100% 100%  100%  Weight:      Height:       No intake or output data in the 24 hours ending 05/09/20 1337 Filed Weights   05/07/20 1526 05/07/20 2233  Weight: 65 kg 62.9 kg    Examination:  General exam: Appears calm and comfortable, comfortable on room air. Respiratory system: Clear to auscultation. Respiratory effort normal. Patient has a subcutaneous port on the left chest wall that is nontender and without any erythema. Cardiovascular system: S1 & S2 heard, RRR. No JVD, murmurs, rubs, gallops or clicks. No pedal edema. Gastrointestinal  system: Abdomen is nondistended, soft and nontender. No organomegaly or masses felt. Normal bowel sounds heard. Central nervous system: Alert and oriented. No focal neurological deficits. Extremities: Symmetric 5 x 5 power. Skin: No rashes, lesions or ulcers Psychiatry: Judgement and insight appear normal. Mood & affect appropriate.     Data Reviewed: I have personally reviewed following labs and imaging studies  CBC: Recent Labs  Lab 05/07/20 1541 05/08/20 0612 05/09/20 0558  WBC 2.0* 1.3* 1.1*  NEUTROABS 1.2*  --  0.4*  HGB 9.3* 7.7* 7.7*  HCT 29.2* 24.0* 24.1*  MCV 100.3* 99.6 101.3*  PLT 165 123* 983*   Basic Metabolic Panel: Recent Labs  Lab 05/07/20 1541 05/08/20 0612  NA 135 134*  K 4.6 4.6  CL 98 103  CO2 26 23  GLUCOSE 106* 95  BUN 22 19  CREATININE 1.01* 0.81    CALCIUM 9.7 8.6*   GFR: Estimated Creatinine Clearance: 56.1 mL/min (by C-G formula based on SCr of 0.81 mg/dL). Liver Function Tests: Recent Labs  Lab 05/07/20 1541  AST 28  ALT 14  ALKPHOS 73  BILITOT 0.9  PROT 7.4  ALBUMIN 3.4*   No results for input(s): LIPASE, AMYLASE in the last 168 hours. No results for input(s): AMMONIA in the last 168 hours. Coagulation Profile: Recent Labs  Lab 05/07/20 1541  INR 1.0   Cardiac Enzymes: No results for input(s): CKTOTAL, CKMB, CKMBINDEX, TROPONINI in the last 168 hours. BNP (last 3 results) No results for input(s): PROBNP in the last 8760 hours. HbA1C: No results for input(s): HGBA1C in the last 72 hours. CBG: No results for input(s): GLUCAP in the last 168 hours. Lipid Profile: No results for input(s): CHOL, HDL, LDLCALC, TRIG, CHOLHDL, LDLDIRECT in the last 72 hours. Thyroid Function Tests: No results for input(s): TSH, T4TOTAL, FREET4, T3FREE, THYROIDAB in the last 72 hours. Anemia Panel: No results for input(s): VITAMINB12, FOLATE, FERRITIN, TIBC, IRON, RETICCTPCT in the last 72 hours. Sepsis Labs: Recent Labs  Lab 05/07/20 1541 05/07/20 1822  LATICACIDVEN 1.2 1.0    Recent Results (from the past 240 hour(s))  Resp Panel by RT-PCR (Flu A&B, Covid) Nasopharyngeal Swab     Status: None   Collection Time: 05/07/20  4:00 PM   Specimen: Nasopharyngeal Swab; Nasopharyngeal(NP) swabs in vial transport medium  Result Value Ref Range Status   SARS Coronavirus 2 by RT PCR NEGATIVE NEGATIVE Final    Comment: (NOTE) SARS-CoV-2 target nucleic acids are NOT DETECTED.  The SARS-CoV-2 RNA is generally detectable in upper respiratory specimens during the acute phase of infection. The lowest concentration of SARS-CoV-2 viral copies this assay can detect is 138 copies/mL. A negative result does not preclude SARS-Cov-2 infection and should not be used as the sole basis for treatment or other patient management decisions. A  negative result may occur with  improper specimen collection/handling, submission of specimen other than nasopharyngeal swab, presence of viral mutation(s) within the areas targeted by this assay, and inadequate number of viral copies(<138 copies/mL). A negative result must be combined with clinical observations, patient history, and epidemiological information. The expected result is Negative.  Fact Sheet for Patients:  EntrepreneurPulse.com.au  Fact Sheet for Healthcare Providers:  IncredibleEmployment.be  This test is no t yet approved or cleared by the [place] FDA and  has been authorized for detection and/or diagnosis of SARS-CoV-2 by FDA under an Emergency Use Authorization (EUA). This EUA will remain  in effect (meaning this test can be used) for the duration of  the COVID-19 declaration under Section 564(b)(1) of the Act, 21 U.S.C.section 360bbb-3(b)(1), unless the authorization is terminated  or revoked sooner.       Influenza A by PCR NEGATIVE NEGATIVE Final   Influenza B by PCR NEGATIVE NEGATIVE Final    Comment: (NOTE) The Xpert Xpress SARS-CoV-2/FLU/RSV plus assay is intended as an aid in the diagnosis of influenza from Nasopharyngeal swab specimens and should not be used as a sole basis for treatment. Nasal washings and aspirates are unacceptable for Xpert Xpress SARS-CoV-2/FLU/RSV testing.  Fact Sheet for Patients: EntrepreneurPulse.com.au  Fact Sheet for Healthcare Providers: IncredibleEmployment.be  This test is not yet approved or cleared by the [place] FDA and has been authorized for detection and/or diagnosis of SARS-CoV-2 by FDA under an Emergency Use Authorization (EUA). This EUA will remain in effect (meaning this test can be used) for the duration of the COVID-19 declaration under Section 564(b)(1) of the Act, 21 U.S.C. section 360bbb-3(b)(1), unless the authorization  is terminated or revoked.  Performed at [facility], [place] [address], [place], [place] [address]   Blood culture (routine x 2)     Status: None (Preliminary result)   Collection Time: 05/07/20  4:01 PM   Specimen: BLOOD  Result Value Ref Range Status   Specimen Description   Final    BLOOD RIGHT ANTECUBITAL Performed at [facility] [address] [place], [place] [address]    Special Requests   Final    BOTTLES DRAWN AEROBIC AND ANAEROBIC Blood Culture adequate volume Performed at [facility] [address], [place], [place] [address]    Culture   Final    NO GROWTH 2 DAYS Performed at [facility] [address] [place], [place] [address]    Report Status PENDING  Incomplete  Blood culture (routine x 2)     Status: None (Preliminary result)   Collection Time: 05/07/20  5:00 PM   Specimen: BLOOD RIGHT FOREARM  Result Value Ref Range Status   Specimen Description   Final    BLOOD RIGHT FOREARM Performed at [facility] [address], [place], [place] [address]    Special Requests   Final    BOTTLES DRAWN AEROBIC AND ANAEROBIC Blood Culture adequate volume Performed at [facility] [address], [place], [place] [address]    Culture   Final    NO GROWTH 2 DAYS Performed at [facility] [address] [place], [place] [address]    Report Status PENDING  Incomplete  Urine culture     Status: Abnormal   Collection Time: 05/07/20  6:17 PM   Specimen: In/Out Cath Urine  Result Value Ref Range Status   Specimen Description   Final    IN/OUT CATH URINE Performed at [facility] [address], [place], [place] [address]    Special Requests   Final    NONE Performed at [facility], [place] [address], [place],  [address]    Culture MULTIPLE SPECIES PRESENT, SUGGEST RECOLLECTION (A)  Final   Report Status  05/09/2020 FINAL  Final         Radiology Studies: DG Chest Port 1 View  Result Date: 05/07/2020 CLINICAL DATA:  Fever and cough for 2 weeks. EXAM: PORTABLE CHEST 1 VIEW COMPARISON:  04/21/2020 and older studies. FINDINGS: Masslike opacity at the right lung base, adjacent linear opacities and associated right pleural effusion are stable from  the prior study. Remainder of the right lung is clear. Subtle small opacity in the left mid lung likely reflects 1 of the nodules seen on the previous CT, 02/25/2020. Left lung is otherwise clear. No convincing left pleural effusion. No pneumothorax. Cardiac silhouette normal in size.  No mediastinal or hilar masses. Right anterior chest wall Port-A-Cath is stable. No acute skeletal abnormality. Sclerotic lesions noted on the prior CT are not visualized on the AP portable semi-erect study. IMPRESSION: 1. No significant change from the most recent prior exam, allowing for differences in patient positioning and technique. 2. The right lung base mass is without substantial change. Smaller lung nodules noted on the prior chest CT are not well visualized on the current portable chest radiograph. Stable right pleural effusion. Electronically Signed   By: Lajean Manes M.D.   On: 05/07/2020 16:35        Scheduled Meds: . cholecalciferol  1,000 Units Oral Daily  . enoxaparin (LOVENOX) injection  40 mg Subcutaneous Q24H  . feeding supplement  237 mL Oral BID BM  . ferrous sulfate   Oral QAC breakfast  . folic acid  1 mg Oral Daily  . Tbo-filgastrim (GRANIX) SQ  300 mcg Subcutaneous q1800   Continuous Infusions: . sodium chloride Stopped (05/08/20 0500)  . ceFEPime (MAXIPIME) IV 2 g (05/09/20 0618)     LOS: 2 days    Time spent: 30 minutes    Barb Merino, MD [facility] Pager [phone]

## 2020-05-09 NOTE — Progress Notes (Signed)
HEMATOLOGY-ONCOLOGY PROGRESS NOTE  SUBJECTIVE: Valerie Long was admitted for fevers.  Found to be neutropenic on admission.  Chest x-ray did not show any evidence of infection.  Blood cultures negative to date.  Urine culture with multiple species present.  Remains on IV antibiotics.  The patient tells me that she was having fevers and chills for about 72 weeks at home.  She notices some coughing.  Feels somewhat better today.  Has been afebrile for the past 24 hours.  Denies chest pain, shortness of breath, abdominal pain, nausea, vomiting.   REVIEW OF SYSTEMS:   Constitutional: Fevers and chills have resolved Eyes: Denies blurriness of vision Ears, nose, mouth, throat, and face: Denies mucositis or sore throat Respiratory: Reports cough without hemoptysis.  Denies shortness of breath. Cardiovascular: Denies palpitation, chest discomfort Gastrointestinal:  Denies nausea, heartburn or change in bowel habits Skin: Denies abnormal skin rashes Lymphatics: Denies new lymphadenopathy or easy bruising Neurological:Denies numbness, tingling or new weaknesses Behavioral/Psych: Mood is stable, no new changes  Extremities: No lower extremity edema All other systems were reviewed with the patient and are negative.  I have reviewed the past medical history, past surgical history, social history and family history with the patient and they are unchanged from previous note.   PHYSICAL EXAMINATION: ECOG PERFORMANCE STATUS: 1 - Symptomatic but completely ambulatory  Vitals:   05/08/20 2115 05/09/20 0514  BP: 132/77 133/80  Pulse: (!) 105 98  Resp:  16  Temp:  98.2 F (36.8 C)  SpO2:  100%   Filed Weights   05/07/20 1526 05/07/20 2233  Weight: 65 kg 62.9 kg    Intake/Output from previous day: No intake/output data recorded.  GENERAL:alert, no distress and comfortable SKIN: skin color, texture, turgor are normal, no rashes or significant lesions EYES: normal, Conjunctiva are pink and  non-injected, sclera clear OROPHARYNX:no exudate, no erythema and lips, buccal mucosa, and tongue normal  LUNGS: clear to auscultation and percussion with normal breathing effort HEART: regular rate & rhythm and no murmurs and no lower extremity edema ABDOMEN:abdomen soft, non-tender and normal bowel sounds Musculoskeletal:no cyanosis of digits and no clubbing  NEURO: alert & oriented x 3 with fluent speech, no focal motor/sensory deficits  LABORATORY DATA:  I have reviewed the data as listed CMP Latest Ref Rng & Units 05/08/2020 05/07/2020 04/30/2020  Glucose 70 - 99 mg/dL 95 106(H) 139(H)  BUN 8 - 23 mg/dL '19 22 18  ' Creatinine 0.44 - 1.00 mg/dL 0.81 1.01(H) 1.06(H)  Sodium 135 - 145 mmol/L 134(L) 135 138  Potassium 3.5 - 5.1 mmol/L 4.6 4.6 3.9  Chloride 98 - 111 mmol/L 103 98 105  CO2 22 - 32 mmol/L '23 26 23  ' Calcium 8.9 - 10.3 mg/dL 8.6(L) 9.7 9.6  Total Protein 6.5 - 8.1 g/dL - 7.4 7.3  Total Bilirubin 0.3 - 1.2 mg/dL - 0.9 0.3  Alkaline Phos 38 - 126 U/L - 73 94  AST 15 - 41 U/L - 28 25  ALT 0 - 44 U/L - 14 9    Lab Results  Component Value Date   WBC 1.1 (LL) 05/09/2020   HGB 7.7 (L) 05/09/2020   HCT 24.1 (L) 05/09/2020   MCV 101.3 (H) 05/09/2020   PLT 111 (L) 05/09/2020   NEUTROABS 0.4 (LL) 05/09/2020    DG Chest 2 View  Result Date: 04/21/2020 CLINICAL DATA:  Cough and fever.  Known lung carcinoma EXAM: CHEST - 2 VIEW COMPARISON:  December 19, 2019 chest radiograph; chest CT February 25, 2020 FINDINGS: There is an apparent mass in the right lower lobe measuring 4.3 x 2.8 cm. There are multiple subcentimeter areas of opacity bilaterally, concerning for metastatic foci. There is a right pleural effusion with right base atelectasis. Port-A-Cath tip is in the superior vena cava. Heart size and pulmonary vascularity normal. No adenopathy. There is thoracolumbar levoscoliosis. IMPRESSION: Apparent mass right lower lobe, similar in appearance to prior CT. Multiple subcentimeter  nodular opacities concerning for metastatic foci. Right pleural effusion is again noted with right base atelectasis. Appearance similar to most recent CT. Heart size normal. No adenopathy evident. Port-A-Cath tip in superior vena cava. Electronically Signed   By: Lowella Grip III M.D.   On: 04/21/2020 12:52   DG Chest Port 1 View  Result Date: 05/07/2020 CLINICAL DATA:  Fever and cough for 2 weeks. EXAM: PORTABLE CHEST 1 VIEW COMPARISON:  04/21/2020 and older studies. FINDINGS: Masslike opacity at the right lung base, adjacent linear opacities and associated right pleural effusion are stable from the prior study. Remainder of the right lung is clear. Subtle small opacity in the left mid lung likely reflects 1 of the nodules seen on the previous CT, 02/25/2020. Left lung is otherwise clear. No convincing left pleural effusion. No pneumothorax. Cardiac silhouette normal in size.  No mediastinal or hilar masses. Right anterior chest wall Port-A-Cath is stable. No acute skeletal abnormality. Sclerotic lesions noted on the prior CT are not visualized on the AP portable semi-erect study. IMPRESSION: 1. No significant change from the most recent prior exam, allowing for differences in patient positioning and technique. 2. The right lung base mass is without substantial change. Smaller lung nodules noted on the prior chest CT are not well visualized on the current portable chest radiograph. Stable right pleural effusion. Electronically Signed   By: Lajean Manes M.D.   On: 05/07/2020 16:35    ASSESSMENT AND PLAN: This is a pleasant 74 year old Hispanic female with metastatic non-small cell lung cancer, adenocarcinoma.  She presented with a right lower lobe lung nodule in addition to mediastinal lymphadenopathy and metastatic bone lesions.  Diagnosed in March 2017.  PD-L1 expression less than 1% and she has no actionable mutations.  She previously underwent induction treatment with systemic chemotherapy with  carboplatin and Alimta.  Now receiving maintenance therapy with single agent Alimta.  Status post 72 cycles.  She has been tolerating her treatment well overall without any concerning adverse effects.  The patient has been experiencing fevers and chills for at least 2 weeks.  Admitted with fevers.  No clear source of infection has been identified to date.  Remains on IV antibiotics and is not having any recurrent fevers or chills.  CBC has been reviewed.  WBC is down to 1.1 with an ANC of 0.4.  Will initiate Granix 300 mcg subcu daily.  I have ordered 3 doses.  If ANC is above 1.0, may discontinue Granix.  Monitor hemoglobin closely.  If hemoglobin is less than 7, recommend PRBC transfusion.  From our standpoint, the patient may be discharged home once ANC is above 1.0 and she remains afebrile.  She is already scheduled for outpatient follow-up on 05/22/2020.  She should keep this appointment.  I also talked with the patient about our symptom management clinic at the cancer center.  Advised her to call our office for same-day appointment when she is not feeling well.  We can evaluate her in our office and initiate treatment if needed.    LOS: 2 days  Mikey Bussing, DNP, AGPCNP-BC, AOCNP 05/09/20

## 2020-05-10 DIAGNOSIS — D709 Neutropenia, unspecified: Secondary | ICD-10-CM | POA: Diagnosis not present

## 2020-05-10 DIAGNOSIS — R5081 Fever presenting with conditions classified elsewhere: Secondary | ICD-10-CM | POA: Diagnosis not present

## 2020-05-10 LAB — CBC WITH DIFFERENTIAL/PLATELET
Abs Immature Granulocytes: 0.05 10*3/uL (ref 0.00–0.07)
Basophils Absolute: 0 10*3/uL (ref 0.0–0.1)
Basophils Relative: 0 %
Eosinophils Absolute: 0 10*3/uL (ref 0.0–0.5)
Eosinophils Relative: 1 %
HCT: 27.4 % — ABNORMAL LOW (ref 36.0–46.0)
Hemoglobin: 8.6 g/dL — ABNORMAL LOW (ref 12.0–15.0)
Immature Granulocytes: 1 %
Lymphocytes Relative: 16 %
Lymphs Abs: 0.8 10*3/uL (ref 0.7–4.0)
MCH: 31.7 pg (ref 26.0–34.0)
MCHC: 31.4 g/dL (ref 30.0–36.0)
MCV: 101.1 fL — ABNORMAL HIGH (ref 80.0–100.0)
Monocytes Absolute: 0.9 10*3/uL (ref 0.1–1.0)
Monocytes Relative: 19 %
Neutro Abs: 2.9 10*3/uL (ref 1.7–7.7)
Neutrophils Relative %: 63 %
Platelets: 119 10*3/uL — ABNORMAL LOW (ref 150–400)
RBC: 2.71 MIL/uL — ABNORMAL LOW (ref 3.87–5.11)
RDW: 15 % (ref 11.5–15.5)
WBC: 4.7 10*3/uL (ref 4.0–10.5)
nRBC: 0 % (ref 0.0–0.2)

## 2020-05-10 MED ORDER — CHLORHEXIDINE GLUCONATE CLOTH 2 % EX PADS
6.0000 | MEDICATED_PAD | Freq: Every day | CUTANEOUS | Status: DC
  Administered 2020-05-10: 6 via TOPICAL

## 2020-05-10 NOTE — Discharge Summary (Signed)
Physician Discharge Summary  Allyssa Abruzzese DGL:[id number] DOB: 1946-03-11 DOA: 05/07/2020  PCP: Colon Branch, MD  Admit date: 05/07/2020 Discharge date: 05/10/2020  Admitted From: Home Disposition: Home  Recommendations for Outpatient Follow-up:  1. Follow-up with oncology office as scheduled.  Home Health: Not applicable Equipment/Devices: Not applicable  Discharge Condition: Stable CODE STATUS: Full code Diet recommendation: Regular diet  Discharge summary: 74 year old female with history of non-small cell lung cancer stage IV, long-term survival on chronic chemotherapy presented to the ER with fever but no localizing symptoms after last chemotherapy on 11/24.  Her ANC was less than 400.  She was admitted to the hospital with cefepime.  Remained stable.  No localizing evidence of infection.  Was also given 1 dose of Granix on 12/3.  Patient did good clinical recovery.  No fever for last 72 hours.  All cultures negative.  Treated with cefepime for 3 days.  Now with negative cultures and normal neutrophil counts discharge home with plan for outpatient follow-up as recommended by oncology.  Discharge Diagnoses:  Principal Problem:   Febrile neutropenia ([place]) Active Problems:   Adenocarcinoma of right lung, stage 4 [facility])    Discharge Instructions  Discharge Instructions    Diet general   Complete by: As directed    Increase activity slowly   Complete by: As directed      Allergies as of 05/10/2020   No Known Allergies     Medication List    TAKE these medications   folic acid 1 MG tablet Commonly known as: FOLVITE Take 1 tablet (1 mg total) by mouth daily.   IRON PO Take 1 tablet by mouth daily.   VITAMIN D3 PO Take 2 tablets by mouth daily.       Follow-up Information    Curt Bears, MD Follow up.   Specialty: Oncology Contact information: [place] [address] [phone]              No Known  Allergies  Consultations:  Oncology   Procedures/Studies: DG Chest 2 View  Result Date: 04/21/2020 CLINICAL DATA:  Cough and fever.  Known lung carcinoma EXAM: CHEST - 2 VIEW COMPARISON:  December 19, 2019 chest radiograph; chest CT February 25, 2020 FINDINGS: There is an apparent mass in the right lower lobe measuring 4.3 x 2.8 cm. There are multiple subcentimeter areas of opacity bilaterally, concerning for metastatic foci. There is a right pleural effusion with right base atelectasis. Port-A-Cath tip is in the superior vena cava. Heart size and pulmonary vascularity normal. No adenopathy. There is thoracolumbar levoscoliosis. IMPRESSION: Apparent mass right lower lobe, similar in appearance to prior CT. Multiple subcentimeter nodular opacities concerning for metastatic foci. Right pleural effusion is again noted with right base atelectasis. Appearance similar to most recent CT. Heart size normal. No adenopathy evident. Port-A-Cath tip in superior vena cava. Electronically Signed   By: Lowella Grip III M.D.   On: 04/21/2020 12:52   DG Chest Port 1 View  Result Date: 05/07/2020 CLINICAL DATA:  Fever and cough for 2 weeks. EXAM: PORTABLE CHEST 1 VIEW COMPARISON:  04/21/2020 and older studies. FINDINGS: Masslike opacity at the right lung base, adjacent linear opacities and associated right pleural effusion are stable from the prior study. Remainder of the right lung is clear. Subtle small opacity in the left mid lung likely reflects 1 of the nodules seen on the previous CT, 02/25/2020. Left lung is otherwise clear. No convincing left pleural effusion. No pneumothorax. Cardiac silhouette  normal in size.  No mediastinal or hilar masses. Right anterior chest wall Port-A-Cath is stable. No acute skeletal abnormality. Sclerotic lesions noted on the prior CT are not visualized on the AP portable semi-erect study. IMPRESSION: 1. No significant change from the most recent prior exam, allowing for differences  in patient positioning and technique. 2. The right lung base mass is without substantial change. Smaller lung nodules noted on the prior chest CT are not well visualized on the current portable chest radiograph. Stable right pleural effusion. Electronically Signed   By: Lajean Manes M.D.   On: 05/07/2020 16:35    (Echo, Carotid, EGD, Colonoscopy, ERCP)    Subjective: Patient seen and examined.  Feels slightly weak but no other complaints.  Eager to go home.   Discharge Exam: Vitals:   05/09/20 2119 05/10/20 0549  BP: 134/75 138/74  Pulse: 99 89  Resp: 14 14  Temp: 99.3 F (37.4 C) 98 F (36.7 C)  SpO2: 100% 99%   Vitals:   05/09/20 0514 05/09/20 1515 05/09/20 2119 05/10/20 0549  BP: 133/80 122/81 134/75 138/74  Pulse: 98 99 99 89  Resp: 16 18 14 14   Temp: 98.2 F (36.8 C) 98.2 F (36.8 C) 99.3 F (37.4 C) 98 F (36.7 C)  TempSrc: Oral Oral Oral Oral  SpO2: 100% 98% 100% 99%  Weight:      Height:        General: Pt is alert, awake, not in acute distress Cardiovascular: RRR, S1/S2 +, no rubs, no gallops She has implantable port on the left chest.  Nontender. Respiratory: CTA bilaterally, no wheezing, no rhonchi Abdominal: Soft, NT, ND, bowel sounds + Extremities: no edema, no cyanosis    The results of significant diagnostics from this hospitalization (including imaging, microbiology, ancillary and laboratory) are listed below for reference.     Microbiology: Recent Results (from the past 240 hour(s))  Resp Panel by RT-PCR (Flu A&B, Covid) Nasopharyngeal Swab     Status: None   Collection Time: 05/07/20  4:00 PM   Specimen: Nasopharyngeal Swab; Nasopharyngeal(NP) swabs in vial transport medium  Result Value Ref Range Status   SARS Coronavirus 2 by RT PCR NEGATIVE NEGATIVE Final    Comment: (NOTE) SARS-CoV-2 target nucleic acids are NOT DETECTED.  The SARS-CoV-2 RNA is generally detectable in upper respiratory specimens during the acute phase of infection. The  lowest concentration of SARS-CoV-2 viral copies this assay can detect is 138 copies/mL. A negative result does not preclude SARS-Cov-2 infection and should not be used as the sole basis for treatment or other patient management decisions. A negative result may occur with  improper specimen collection/handling, submission of specimen other than nasopharyngeal swab, presence of viral mutation(s) within the areas targeted by this assay, and inadequate number of viral copies(<138 copies/mL). A negative result must be combined with clinical observations, patient history, and epidemiological information. The expected result is Negative.  Fact Sheet for Patients:  EntrepreneurPulse.com.au  Fact Sheet for Healthcare Providers:  IncredibleEmployment.be  This test is no t yet approved or cleared by the [place] FDA and  has been authorized for detection and/or diagnosis of SARS-CoV-2 by FDA under an Emergency Use Authorization (EUA). This EUA will remain  in effect (meaning this test can be used) for the duration of the COVID-19 declaration under Section 564(b)(1) of the Act, 21 U.S.C.section 360bbb-3(b)(1), unless the authorization is terminated  or revoked sooner.       Influenza A by PCR NEGATIVE NEGATIVE Final  Influenza B by PCR NEGATIVE NEGATIVE Final    Comment: (NOTE) The Xpert Xpress SARS-CoV-2/FLU/RSV plus assay is intended as an aid in the diagnosis of influenza from Nasopharyngeal swab specimens and should not be used as a sole basis for treatment. Nasal washings and aspirates are unacceptable for Xpert Xpress SARS-CoV-2/FLU/RSV testing.  Fact Sheet for Patients: EntrepreneurPulse.com.au  Fact Sheet for Healthcare Providers: IncredibleEmployment.be  This test is not yet approved or cleared by the [place] FDA and has been authorized for detection and/or diagnosis of SARS-CoV-2 by FDA under  an Emergency Use Authorization (EUA). This EUA will remain in effect (meaning this test can be used) for the duration of the COVID-19 declaration under Section 564(b)(1) of the Act, 21 U.S.C. section 360bbb-3(b)(1), unless the authorization is terminated or revoked.  Performed at [facility], [place] [address] [place], [place] [address]   Blood culture (routine x 2)     Status: None (Preliminary result)   Collection Time: 05/07/20  4:01 PM   Specimen: BLOOD  Result Value Ref Range Status   Specimen Description   Final    BLOOD RIGHT ANTECUBITAL Performed at [facility] [address] [place], [place] [address]    Special Requests   Final    BOTTLES DRAWN AEROBIC AND ANAEROBIC Blood Culture adequate volume Performed at [facility] [address], [place], [place] [address]    Culture   Final    NO GROWTH 3 DAYS Performed at [facility], [place] [address], [place], [place] [address]    Report Status PENDING  Incomplete  Blood culture (routine x 2)     Status: None (Preliminary result)   Collection Time: 05/07/20  5:00 PM   Specimen: BLOOD RIGHT FOREARM  Result Value Ref Range Status   Specimen Description   Final    BLOOD RIGHT FOREARM Performed at [facility] [address] [place], [place] [address]    Special Requests   Final    BOTTLES DRAWN AEROBIC AND ANAEROBIC Blood Culture adequate volume Performed at [facility] [address], [place], [place] [address]    Culture   Final    NO GROWTH 3 DAYS Performed at [facility], [place] [address] [place], [place] [address]    Report Status PENDING  Incomplete  Urine culture     Status: Abnormal   Collection Time: 05/07/20  6:17 PM   Specimen: In/Out Cath Urine  Result Value Ref Range Status   Specimen Description   Final    IN/OUT CATH URINE Performed at [facility] [address],  [place], [place] [address]    Special Requests   Final    NONE Performed at [facility], [place] [address], [place], [place] [address]    Culture MULTIPLE SPECIES PRESENT, SUGGEST RECOLLECTION (A)  Final   Report Status 05/09/2020 FINAL  Final     Labs: BNP (last 3 results) No results for input(s): BNP in the last 8760 hours. Basic Metabolic Panel: Recent Labs  Lab 05/07/20 1541 05/08/20 0612  NA 135 134*  K 4.6 4.6  CL 98 103  CO2 26 23  GLUCOSE 106* 95  BUN 22 19  CREATININE 1.01* 0.81  CALCIUM 9.7 8.6*   Liver Function Tests: Recent Labs  Lab 05/07/20 1541  AST 28  ALT 14  ALKPHOS 73  BILITOT 0.9  PROT 7.4  ALBUMIN 3.4*   No  results for input(s): LIPASE, AMYLASE in the last 168 hours. No results for input(s): AMMONIA in the last 168 hours. CBC: Recent Labs  Lab 05/07/20 1541 05/08/20 0612 05/09/20 0558 05/10/20 0802  WBC 2.0* 1.3* 1.1* 4.7  NEUTROABS 1.2*  --  0.4* 2.9  HGB 9.3* 7.7* 7.7* 8.6*  HCT 29.2* 24.0* 24.1* 27.4*  MCV 100.3* 99.6 101.3* 101.1*  PLT 165 123* 111* 119*   Cardiac Enzymes: No results for input(s): CKTOTAL, CKMB, CKMBINDEX, TROPONINI in the last 168 hours. BNP: Invalid input(s): POCBNP CBG: No results for input(s): GLUCAP in the last 168 hours. D-Dimer No results for input(s): DDIMER in the last 72 hours. Hgb A1c No results for input(s): HGBA1C in the last 72 hours. Lipid Profile No results for input(s): CHOL, HDL, LDLCALC, TRIG, CHOLHDL, LDLDIRECT in the last 72 hours. Thyroid function studies No results for input(s): TSH, T4TOTAL, T3FREE, THYROIDAB in the last 72 hours.  Invalid input(s): FREET3 Anemia work up No results for input(s): VITAMINB12, FOLATE, FERRITIN, TIBC, IRON, RETICCTPCT in the last 72 hours. Urinalysis    Component Value Date/Time   COLORURINE YELLOW 05/07/2020 1817   APPEARANCEUR CLEAR 05/07/2020 1817   LABSPEC 1.014 05/07/2020 1817   PHURINE 6.0 05/07/2020 1817   GLUCOSEU NEGATIVE  05/07/2020 1817   HGBUR NEGATIVE 05/07/2020 1817   BILIRUBINUR NEGATIVE 05/07/2020 1817   BILIRUBINUR negative 04/21/2020 1155   KETONESUR NEGATIVE 05/07/2020 1817   PROTEINUR NEGATIVE 05/07/2020 1817   UROBILINOGEN negative (A) 04/21/2020 1155   UROBILINOGEN 0.2 08/02/2014 0327   NITRITE NEGATIVE 05/07/2020 1817   LEUKOCYTESUR NEGATIVE 05/07/2020 1817   Sepsis Labs Invalid input(s): PROCALCITONIN,  WBC,  LACTICIDVEN Microbiology Recent Results (from the past 240 hour(s))  Resp Panel by RT-PCR (Flu A&B, Covid) Nasopharyngeal Swab     Status: None   Collection Time: 05/07/20  4:00 PM   Specimen: Nasopharyngeal Swab; Nasopharyngeal(NP) swabs in vial transport medium  Result Value Ref Range Status   SARS Coronavirus 2 by RT PCR NEGATIVE NEGATIVE Final    Comment: (NOTE) SARS-CoV-2 target nucleic acids are NOT DETECTED.  The SARS-CoV-2 RNA is generally detectable in upper respiratory specimens during the acute phase of infection. The lowest concentration of SARS-CoV-2 viral copies this assay can detect is 138 copies/mL. A negative result does not preclude SARS-Cov-2 infection and should not be used as the sole basis for treatment or other patient management decisions. A negative result may occur with  improper specimen collection/handling, submission of specimen other than nasopharyngeal swab, presence of viral mutation(s) within the areas targeted by this assay, and inadequate number of viral copies(<138 copies/mL). A negative result must be combined with clinical observations, patient history, and epidemiological information. The expected result is Negative.  Fact Sheet for Patients:  EntrepreneurPulse.com.au  Fact Sheet for Healthcare Providers:  IncredibleEmployment.be  This test is no t yet approved or cleared by the [place] FDA and  has been authorized for detection and/or diagnosis of SARS-CoV-2 by FDA under an Emergency Use  Authorization (EUA). This EUA will remain  in effect (meaning this test can be used) for the duration of the COVID-19 declaration under Section 564(b)(1) of the Act, 21 U.S.C.section 360bbb-3(b)(1), unless the authorization is terminated  or revoked sooner.       Influenza A by PCR NEGATIVE NEGATIVE Final   Influenza B by PCR NEGATIVE NEGATIVE Final    Comment: (NOTE) The Xpert Xpress SARS-CoV-2/FLU/RSV plus assay is intended as an aid in the diagnosis of influenza from Nasopharyngeal swab  specimens and should not be used as a sole basis for treatment. Nasal washings and aspirates are unacceptable for Xpert Xpress SARS-CoV-2/FLU/RSV testing.  Fact Sheet for Patients: EntrepreneurPulse.com.au  Fact Sheet for Healthcare Providers: IncredibleEmployment.be  This test is not yet approved or cleared by the [place] FDA and has been authorized for detection and/or diagnosis of SARS-CoV-2 by FDA under an Emergency Use Authorization (EUA). This EUA will remain in effect (meaning this test can be used) for the duration of the COVID-19 declaration under Section 564(b)(1) of the Act, 21 U.S.C. section 360bbb-3(b)(1), unless the authorization is terminated or revoked.  Performed at [facility], [place] [address] [place], [place] [address]   Blood culture (routine x 2)     Status: None (Preliminary result)   Collection Time: 05/07/20  4:01 PM   Specimen: BLOOD  Result Value Ref Range Status   Specimen Description   Final    BLOOD RIGHT ANTECUBITAL Performed at [facility] [address], [place], [place] [address]    Special Requests   Final    BOTTLES DRAWN AEROBIC AND ANAEROBIC Blood Culture adequate volume Performed at [facility] [address] [place], [place] [address]    Culture   Final    NO GROWTH 3 DAYS Performed at [facility], [place] [address] [place], [place]  [address]    Report Status PENDING  Incomplete  Blood culture (routine x 2)     Status: None (Preliminary result)   Collection Time: 05/07/20  5:00 PM   Specimen: BLOOD RIGHT FOREARM  Result Value Ref Range Status   Specimen Description   Final    BLOOD RIGHT FOREARM Performed at [facility] [address], [place], [place] [address]    Special Requests   Final    BOTTLES DRAWN AEROBIC AND ANAEROBIC Blood Culture adequate volume Performed at [facility] [address], [place], [place] [address]    Culture   Final    NO GROWTH 3 DAYS Performed at [facility], [place] [address] [place], [place] [address]    Report Status PENDING  Incomplete  Urine culture     Status: Abnormal   Collection Time: 05/07/20  6:17 PM   Specimen: In/Out Cath Urine  Result Value Ref Range Status   Specimen Description   Final    IN/OUT CATH URINE Performed at [facility] [address], [place], [place] [address]    Special Requests   Final    NONE Performed at [facility], [place] [address], [place], [place] [address]    Culture MULTIPLE SPECIES PRESENT, SUGGEST RECOLLECTION (A)  Final   Report Status 05/09/2020 FINAL  Final     Time coordinating discharge: 28 minutes  SIGNED:   Barb Merino, MD  [facility] 05/10/2020, 12:52 PM

## 2020-05-10 NOTE — Discharge Instructions (Signed)
Fiebre en los adultos Fever, Adult     La fiebre es un aumento de la Firefighter. A menudo significa una temperatura de 100,45F (38C) o ms. Las fiebres leves o moderadas y de corta duracin en general no tienen efectos a Barrister's clerk. A menudo no requieren tratamiento. Las fiebres moderadas o altas pueden hacerlo sentir incmodo. En ocasiones, pueden ser un signo de [place] afeccin o enfermedad graves. Una fiebre que vuelve una y otra vez o que dura mucho tiempo puede causar la prdida de agua en el cuerpo (deshidratacin). Puede tomarse la temperatura con un termmetro para saber si tiene fiebre. La temperatura puede variar segn:  La edad.  El momento del da.  El lugar del cuerpo donde se coloque el termmetro. Las lecturas pueden variar cuando el termmetro se coloca: ? En la boca (oral). ? En el ano (rectal). ? En el odo (timpnica). ? Debajo del brazo Art therapist). ? En la frente (temporal). Siga estas indicaciones en su casa: Medicamentos  [employer] de venta libre y los recetados solamente como se lo haya indicado el mdico. Siga cuidadosamente las instrucciones con respecto a la dosis.  Si le recetaron un antibitico, tmelo como se lo haya indicado el mdico. No deje de tomarlo aunque comience a sentirse mejor. Indicaciones generales  Controle si hay algn cambio en sus sntomas. Dgale a su mdico sobre ellos.  Descanse todo lo que sea necesario.  Beba suficiente lquido para Contractor pis (la orina) de color amarillo plido.  Dese un bao de esponja o de inmersin con agua a temperatura ambiente segn sea necesario. Esto ayuda a Mining engineer. No use agua helada.  No use demasiadas mantas ni use ropa que sea demasiado pesada.  Si la fiebre fue causada por una infeccin que se transmite de persona a persona (es contagiosa), como el resfro o la gripe: ? Debe quedarse en su casa y no concurrir al [place] o a lugares pblicos durante al  menos 24 horas despus de la desaparicin de la fiebre. ? La fiebre debe desaparecer durante al menos 24 horas sin necesidad de [employer]. Comunquese con un mdico si:  Vomita.  No puede comer ni beber lquidos sin vomitar.  Presenta heces lquidas (diarrea).  Le duele al hacer pis.  Los sntomas no mejoran con Dispensing optician.  Aparecen nuevos sntomas.  Se siente muy dbil. Solicite ayuda inmediatamente si:  Siente falta de aire o dificultad para respirar.  Se siente mareado o se desvanece (se desmaya).  Se siente desorientado (confundido).  Presenta signos de no tener suficiente agua en el cuerpo, por ejemplo: ? Elmon Else, muy escasa o falta de [place]. ? Labios agrietados. ? Sequedad de boca. ? Ojos hundidos. ? Somnolencia. ? Debilidad.  Tiene dolor muy intenso en el vientre (abdomen).  Contina vomitando o con diarrea.  Tiene una erupcin cutnea en la piel.  Los sntomas empeoran de [place] repentina. Resumen  La fiebre es un aumento de la Firefighter. A menudo significa una temperatura de 100,45F (38C) o ms.  Controle si hay algn cambio en sus sntomas. Dgale a su mdico sobre ellos.  Tome todos los medicamentos solamente como se lo haya indicado el mdico.  No concurra al [place] o a otros lugares pblicos si la fiebre fue causado por una enfermedad que puede transmitirse a Producer, television/film/video.  Solicite ayuda de inmediato si tiene signos de no tener suficiente agua en el cuerpo. Esta informacin no tiene Psychologist, clinical  consejo del mdico. Asegrese de hacerle al mdico cualquier pregunta que tenga. Document Revised: 01/03/2018 Document Reviewed: 01/03/2018 Elsevier Patient Education  [place].   Neutropenia Neutropenia La neutropenia es una afeccin que surge cuando el nivel de un tipo de glbulos blancos (neutrfilos) en el cuerpo es inferior al normal. Los neutrfilos, que se producen en el centro esponjoso de  los huesos grandes (mdula sea), combaten las infecciones. Los neutrfilos son la defensa principal del cuerpo contra las infecciones por bacterias y hongos. Cuanto ms bajo sea el nivel de neutrfilos y cuanto ms tiempo el cuerpo se quede sin ellos, ms riesgos hay de sufrir una infeccin grave. Cules son las causas? Esta afeccin puede aparecer cuando el cuerpo utiliza o destruye neutrfilos ms rpido de lo que la mdula sea puede producirlos. Las causas de la neutropenia pueden ser las siguientes:  Una infeccin por bacterias u hongos.  Trastornos alrgicos.  Reacciones a algunos medicamentos.  Una enfermedad autoinmunitaria.  Agrandamiento del bazo. Esta afeccin tambin puede surgir si la mdula sea no produce la cantidad suficiente de neutrfilos. Este problema puede deberse a lo siguiente:  Cncer.  Tratamientos Social worker, como radioterapia o quimioterapia.  Infecciones virales.  Medicamentos, como fenitona.  Deficiencia de vitamina B12.  Enfermedades de la mdula sea.  Toxinas ambientales, como insecticidas. Cules son los signos o los sntomas? Por lo general, esta afeccin no causa sntomas. Si aparecen sntomas, estos suelen deberse a una infeccin preexistente. Los sntomas de una infeccin pueden incluir los siguientes:  [place].  Escalofros.  Glndulas inflamadas.  lceras orales o anales.  Tos y [place].  Erupcin cutnea.  Infeccin en la piel.  Fatiga. Cmo se diagnostica? El mdico puede sospechar la presencia de neutropenia si usted tiene lo siguiente:  Una afeccin que cause neutropenia.  Sntomas durante o despus del tratamiento para el cncer.  Sntomas de infeccin, especialmente, fiebre.  Infecciones frecuentes y poco comunes. Esta afeccin se diagnostica en funcin de sus antecedentes mdicos y de un examen fsico. Tambin se le realizarn pruebas, por ejemplo:  Un hemograma completo.  Un procedimiento para  extraer Truddie Coco de mdula sea y luego examinarla (biopsia de mdula sea).  Una radiografa de trax.  Un cultivo de [place].  Un hemocultivo. Cmo se trata? El tratamiento depende de la causa preexistente y de la gravedad de la afeccin. La neutropenia leve puede no requerir tratamiento. El tratamiento puede incluir medicamentos, por ejemplo:  Administracin de antibiticos a travs de una va i.v.  Medicamentos antivirales.  Medicamentos antimicticos.  Un medicamento para aumentar la produccin de neutrfilos (factor estimulante de colonias). Este medicamento puede administrarse por una va i.v. o con una inyeccin.  Corticoesteroides administrados a travs de una va i.v. Si la causa de la neutropenia es una afeccin preexistente, puede requerir tratamiento para esa afeccin. Si los medicamentos o los tratamientos contra el cncer estn causando neutropenia, el mdico puede indicarle que suspenda los medicamentos o el [address]. Siga estas instrucciones en su casa: Medicamentos   Tome los medicamentos de venta libre y los recetados solamente como se lo haya indicado el mdico.  Colquese la vacuna contra la gripe estacional.  Evite el contacto con personas que recibieron una vacuna en los ltimos 30das si esa vacuna contena virus vivos. No debe recibir una vacuna con virus vivos. Las vacunas con virus vivos ms frecuentes son la vacuna contra polio; sarampin, paperas, rubola (SRP); varicela y [place]. Comida y bebida  No comparta utensilios personales.  No coma productos no pasteurizados.  No coma carne, pescado ni huevos crudos o poco cocidos.  No coma frutas ni vegetales crudos sin lavar. Estilo de vida  Evite exponerse a grupos de Engineer, manufacturing o nios.  Evite estar cerca de personas enfermas.  Evite estar cerca de la suciedad o del polvo, por ejemplo, en reas de construccin o jardines.  No cuide directamente a sus mascotas. Evite los excrementos de los  [place]. No limpie las cajas de arena y las [place] para pjaros.  No tenga relaciones sexuales si el mdico no lo ha autorizado. [employer].  Limpie el rea entre los genitales y el ano (rea perineal) despus de Garment/textile technologist o hacer deposiciones. Si es [place], lmpiese desde adelante hacia atrs.  Cepllese los dientes con un cepillo de cerdas suaves antes y despus de las comidas.  No use una afeitadora regular. Use una rasuradora elctrica para afeitarse.  Lvese las manos con frecuencia. Asegrese de [employer] [place] [employer] personas que estn en contacto con usted tambin se laven las manos. Use desinfectante para manos si no dispone de [place] y [place]. Instrucciones generales  Siga las precauciones que le haya indicado el mdico para reducir el riesgo de lesin o infeccin.  Tome medidas para evitar los cortes y [place]. Por ejemplo: ? Tenga cuidado al usar cuchillos. Siempre corte en direccin contraria a su cuerpo. ? Mantenga los cuchillos en fundas o cubiertas protectoras cuando no los use. ? Use guantes para horno al cocinar en el horno, la cocina o la parrilla. ? Aljese del fuego.  No use tampones, enemas o supositorios rectales excepto si lo ha autorizado su mdico.  Concurra a todas las visitas de seguimiento como se lo haya indicado el mdico. Esto es importante. Comunquese con un mdico si:  Tiene los siguientes sntomas: ? Dolor de Investment banker, operational. ? Una zona de piel que est caliente, enrojecida o dolorosa a la palpacin. ? Tos. ? Orina frecuente o dolor al [place]. ? Secrecin o picazn vaginal.  Tiene los siguientes signos y sntomas: ? Llagas en la boca o en el ano. ? Ganglios linfticos hinchados. ? Lneas rojas en la piel. ? Erupcin cutnea. Solicite ayuda inmediatamente si:  Tiene los siguientes sntomas: ? Cristy Hilts. ? Escalofros, o comienza a temblar.  Presenta los siguientes sntomas: ? Tiene nuseas o vomita. ? Siente mucha fatiga. ? Le falta  el aire. Resumen  La neutropenia es una afeccin que surge cuando el nivel de un tipo de glbulos blancos (neutrfilos) en el cuerpo es inferior al normal.  Esta afeccin puede aparecer cuando el cuerpo utiliza o destruye neutrfilos ms rpido de lo que la mdula sea puede producirlos.  El tratamiento depende de la causa preexistente y de la gravedad de la afeccin. La neutropenia leve puede no requerir tratamiento.  Siga las precauciones que le haya indicado el mdico para reducir el riesgo de lesin o infeccin. Esta informacin no tiene Marine scientist el consejo del mdico. Asegrese de hacerle al mdico cualquier pregunta que tenga. Document Revised: 04/19/2018 Document Reviewed: 04/19/2018 Elsevier Patient Education  2020 [employer].

## 2020-05-12 LAB — CULTURE, BLOOD (ROUTINE X 2)
Culture: NO GROWTH
Culture: NO GROWTH
Special Requests: ADEQUATE
Special Requests: ADEQUATE

## 2020-05-13 ENCOUNTER — Other Ambulatory Visit: Payer: Self-pay

## 2020-05-13 ENCOUNTER — Ambulatory Visit (INDEPENDENT_AMBULATORY_CARE_PROVIDER_SITE_OTHER): Payer: Medicare Other | Admitting: Obstetrics and Gynecology

## 2020-05-13 ENCOUNTER — Encounter: Payer: Self-pay | Admitting: Obstetrics and Gynecology

## 2020-05-13 VITALS — BP 124/80 | Ht 63.0 in | Wt 142.0 lb

## 2020-05-13 DIAGNOSIS — Z124 Encounter for screening for malignant neoplasm of cervix: Secondary | ICD-10-CM | POA: Diagnosis not present

## 2020-05-13 DIAGNOSIS — M8588 Other specified disorders of bone density and structure, other site: Secondary | ICD-10-CM

## 2020-05-13 DIAGNOSIS — M8580 Other specified disorders of bone density and structure, unspecified site: Secondary | ICD-10-CM

## 2020-05-13 DIAGNOSIS — Z01419 Encounter for gynecological examination (general) (routine) without abnormal findings: Secondary | ICD-10-CM

## 2020-05-13 NOTE — Progress Notes (Signed)
   Valerie Long 1946-04-25 [id number]  SUBJECTIVE:  74 y.o. G27P0010 female here for a breast and pelvic exam and Pap smear. She has no gynecologic concerns.  Last seen in this office 11/2016 by Dr. Toney Rakes.  She has stage 4 lung cancer.  Recently was hospitalized and released after treatment for neutropenic fever.  Current Outpatient Medications  Medication Sig Dispense Refill  . Cholecalciferol (VITAMIN D3 PO) Take 2 tablets by mouth daily.    . Ferrous Sulfate (IRON PO) Take 1 tablet by mouth daily.    . folic acid (FOLVITE) 1 MG tablet Take 1 tablet (1 mg total) by mouth daily. 90 tablet 0  . gabapentin (NEURONTIN) 300 MG capsule Take 300 mg by mouth 3 (three) times daily.     No current facility-administered medications for this visit.   Facility-Administered Medications Ordered in Other Visits  Medication Dose Route Frequency Provider Last Rate Last Admin  . sodium chloride flush (NS) 0.9 % injection 10 mL  10 mL Intracatheter PRN Curt Bears, MD   10 mL at 11/15/19 1400   Allergies: Patient has no known allergies.  Patient's last menstrual period was 06/09/1998.  Past medical history,surgical history, problem list, medications, allergies, family history and social history were all reviewed and documented as reviewed in the EPIC chart.  GYN ROS: no abnormal bleeding, pelvic pain or discharge, no breast pain or new or enlarging lumps on self exam.  No dysuria, urinary frequency, pain with urination, cloudy/malodorous urine.   OBJECTIVE:  BP 124/80   Ht 5\' 3"  (1.6 m)   Wt 142 lb (64.4 kg)   LMP 06/09/1998   BMI 25.15 kg/m  The patient appears well, alert, oriented, in no distress.  BREAST EXAM: breasts appear normal, no suspicious masses, no skin or nipple changes or axillary nodes, chemotherapy port noted at corner of medial upper quadrant of right breast  PELVIC EXAM: VULVA: normal appearing vulva with atrophic change, no masses, tenderness or lesions, VAGINA: normal  appearing vagina with atrophic change, normal color and discharge, no lesions, CERVIX: normal appearing atrophic cervix without discharge or lesions, UTERUS: uterus is normal size, shape, consistency and nontender, ADNEXA: normal adnexa in size, nontender and no masses, RECTAL: external hemorrhoids non-thrombosed, PAP: Pap smear done today, thin-prep method  Chaperone: Caryn Bee present during the examination  ASSESSMENT:  74 y.o. G1P0010 here for a breast and pelvic exam  PLAN:   1. Postmenopausal.  No significant hot flashes or night sweats.  No vaginal bleeding. 2. Pap smear/HPV 11/2016.  ASCUS Pap smear noted in 2013 negative HPV test.  [name] ahead and collected a Pap smear today given that she is beyond the 3-year interval from her last test with a cytology abnormality within the last 10 years. 3. Mammogram 03/2020.  Normal breast exam today.  She will continue with annual mammograms. 4. Colonoscopy 2013.  She will follow up at the interval recommended by her GI specialist.   5. DEXA 12/2016.  T score -1.3, FRAX 5.9% / 1.0%.  I did offer to repeat her bone density, but she believes that since she has bone metastases so she does not want to pursue any further bone density screening. 6. Health maintenance.  No labs today as she normally has these completed elsewhere.  Return annually or sooner, prn.  Joseph Pierini MD 05/13/20

## 2020-05-13 NOTE — Addendum Note (Signed)
Addended by: Nelva Nay on: 05/13/2020 03:55 PM   Modules accepted: Orders

## 2020-05-14 LAB — PAP IG W/ RFLX HPV ASCU

## 2020-05-15 ENCOUNTER — Ambulatory Visit (HOSPITAL_COMMUNITY)
Admission: RE | Admit: 2020-05-15 | Discharge: 2020-05-15 | Disposition: A | Discharge status: Home / Self Care | Payer: Medicare Other | Source: Ambulatory Visit | Attending: Physician Assistant | Admitting: Physician Assistant

## 2020-05-15 ENCOUNTER — Other Ambulatory Visit: Payer: Self-pay

## 2020-05-15 DIAGNOSIS — J90 Pleural effusion, not elsewhere classified: Secondary | ICD-10-CM | POA: Diagnosis not present

## 2020-05-15 DIAGNOSIS — C3490 Malignant neoplasm of unspecified part of unspecified bronchus or lung: Secondary | ICD-10-CM | POA: Diagnosis not present

## 2020-05-15 DIAGNOSIS — C3491 Malignant neoplasm of unspecified part of right bronchus or lung: Secondary | ICD-10-CM | POA: Diagnosis not present

## 2020-05-15 DIAGNOSIS — G9589 Other specified diseases of spinal cord: Secondary | ICD-10-CM | POA: Diagnosis not present

## 2020-05-15 DIAGNOSIS — K7689 Other specified diseases of liver: Secondary | ICD-10-CM | POA: Diagnosis not present

## 2020-05-15 DIAGNOSIS — C7951 Secondary malignant neoplasm of bone: Secondary | ICD-10-CM | POA: Diagnosis not present

## 2020-05-15 DIAGNOSIS — K8020 Calculus of gallbladder without cholecystitis without obstruction: Secondary | ICD-10-CM | POA: Diagnosis not present

## 2020-05-15 MED ORDER — IOHEXOL 300 MG/ML  SOLN
100.0000 mL | Freq: Once | INTRAMUSCULAR | Status: AC | PRN
  Administered 2020-05-15: 100 mL via INTRAVENOUS

## 2020-05-19 ENCOUNTER — Other Ambulatory Visit: Payer: Self-pay

## 2020-05-19 ENCOUNTER — Ambulatory Visit (INDEPENDENT_AMBULATORY_CARE_PROVIDER_SITE_OTHER): Payer: Medicare Other | Admitting: Internal Medicine

## 2020-05-19 VITALS — BP 130/82 | HR 91 | Temp 98.1°F | Ht 63.0 in | Wt 141.0 lb

## 2020-05-19 DIAGNOSIS — K623 Rectal prolapse: Secondary | ICD-10-CM

## 2020-05-19 DIAGNOSIS — R5081 Fever presenting with conditions classified elsewhere: Secondary | ICD-10-CM

## 2020-05-19 DIAGNOSIS — D709 Neutropenia, unspecified: Secondary | ICD-10-CM

## 2020-05-19 DIAGNOSIS — Z7185 Encounter for immunization safety counseling: Secondary | ICD-10-CM | POA: Diagnosis not present

## 2020-05-19 NOTE — Progress Notes (Signed)
Subjective:    Patient ID: Valerie Long, female    DOB: [birth year], 74 y.o.   MRN: [id number]  DOS:  05/19/2020 Type of visit - description: Follow-up Since the last office visit was admitted to the hospital 05/07/2020 and discharged 3 days later She was admitted with neutropenic fever, got antibiotics, blood and urine cultures negative. Eventually was better and sent home.  Since she left the hospital is doing okay. No fever. Some nausea but no vomiting or diarrhea Cough is chronic,, no hemoptysis. Has lower extremity edema: At baseline. In general feels weaker now than few months ago.  Had surveillant CTs 05/15/2020: CT chest -abd-pelvis: Mild progression of numerous bilateral subsolid pulmonary metastases. Dominant anterior right lower lobe lung mass is stable. 2. Moderate right pleural effusion, increased. 3. New indistinct patchy consolidation and ground-glass opacity in the left greater than right lower lobes, favor a nonspecific infectious or inflammatory etiology, with differential including pneumonia or drug toxicity. 4. Widespread small sclerotic bone metastases, unchanged. 5. Aortic Atherosclerosis (ICD10-I70.0).  Review of Systems See above   Past Medical History:  Diagnosis Date  . Adenocarcinoma of right lung, stage 4 ([place]) 2017  . Anemia   . Arthritis   . Bone metastases ([place]) 02/24/2017  . Encounter for antineoplastic chemotherapy 03/17/2016  . Hypercholesterolemia   . Hypertension 06/18/2016  . Osteopenia   . 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 Pneumonia   . Shortness of breath dyspnea     Past Surgical History:  Procedure Laterality Date  . CESAREAN SECTION     myomectomy  . COLONOSCOPY W/ POLYPECTOMY    . IR GENERIC HISTORICAL  08/17/2016   IR US GUIDE VASC ACCESS RIGHT 08/17/2016 [name], MD [facility]-INTERV RAD  . IR GENERIC HISTORICAL  08/17/2016   IR FLUORO GUIDE PORT INSERTION RIGHT 08/17/2016 [name], MD  [facility]-INTERV RAD  . MEDIASTINOSCOPY N/A 09/25/2015   Procedure: MEDIASTINOSCOPY;  Surgeon: Melrose Nakayama, MD;  Location: [facility];  Service: Thoracic;  Laterality: N/A;  . VIDEO BRONCHOSCOPY Bilateral 08/19/2015   Procedure: VIDEO BRONCHOSCOPY WITH FLUORO;  Surgeon: Rigoberto Noel, MD;  Location: [facility];  Service: Cardiopulmonary;  Laterality: Bilateral;  . VIDEO BRONCHOSCOPY N/A 09/08/2015   Procedure: VIDEO BRONCHOSCOPY WITH FLUORO;  Surgeon: Rigoberto Noel, MD;  Location: [facility];  Service: Thoracic;  Laterality: N/A;  . VIDEO BRONCHOSCOPY WITH ENDOBRONCHIAL ULTRASOUND Right 09/08/2015   Procedure: ATTEMPTED VIDEO BRONCHOSCOPY ENDOBRONCHIAL ULTRASOUND  ;  Surgeon: Rigoberto Noel, MD;  Location: [facility];  Service: Thoracic;  Laterality: Right;    Allergies as of 05/19/2020   No Known Allergies     Medication List       Accurate as of May 19, 2020 11:34 AM. If you have any questions, ask your nurse or doctor.        folic acid 1 MG tablet Commonly known as: FOLVITE Take 1 tablet (1 mg total) by mouth daily.   gabapentin 300 MG capsule Commonly known as: NEURONTIN Take 300 mg by mouth 3 (three) times daily.   IRON PO Take 1 tablet by mouth daily.   VITAMIN D3 PO Take 2 tablets by mouth daily.          Objective:   Physical Exam BP 130/82 (BP Location: Right Arm, Patient Position: Sitting, Cuff Size: Large)   Pulse 91   Temp 98.1 F (36.7 C) (Oral)   Ht 5\' 3"  (1.6 m)   Wt 141  lb (64 kg)   LMP 06/09/1998   SpO2 99%   BMI 24.98 kg/m  General:   Well developed, some cough noted with large airway congestion, nontoxic appearing. HEENT:  Normocephalic . Face symmetric, atraumatic Lungs:  Decreased breath sounds upper> lower right field.  + Large airway congestion. Normal respiratory effort, no intercostal retractions, no accessory muscle use. Heart: RRR,  no murmur.  Lower extremities: +/+++ pretibial edema bilaterally  Skin: Not pale. Not jaundice Neurologic:   alert & oriented X3.  Speech normal, gait appropriate for age and unassisted Psych--  Cognition and judgment appear intact.  Cooperative with normal attention span and concentration.  Behavior appropriate. No anxious or depressed appearing.      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 Dx  March 2017  PLAN: Neutropenic fever: Recently admitted to the hospital, cultures negative, she is now afebrile, will see hematology in 3 days. Recommend to seek medical help sooner if needed. I reviewed the CTs done after the admission for surveillance, they report new patchy consolidation and groundglass opacity, discussed informally with oncology, no need to pursue antibiotics at this point if she is asymptomatic.  Appreciate their help. Rectal prolapse?  Also, patient reports that for a while she noticed her rectum gets out perhaps 1 or 2 cm with cough or Valsalva, it is easily pushed back in by the patient, states that she already discussed that with gynecology. Advised patient I could refer her to surgery for further evaluation but states she most likely won't consent to surgery. We also agreed that the best thing to do for now is control her cough to minimize the prolapse. If she has a large or nonreducible rectal prolapse needs to seek medical attention. Preventive care reviewed RTC 6 to 8 months    This visit occurred during the SARS-CoV-2 public health emergency.  Safety protocols were in place, including screening questions prior to the visit, additional usage of staff PPE, and extensive cleaning of exam room while observing appropriate contact time as indicated for disinfecting solutions.

## 2020-05-19 NOTE — Patient Instructions (Addendum)
TOME MUCINEX DM 1 PASTILLA DOS VECES AL DIA      GO TO THE FRONT DESK, PLEASE SCHEDULE YOUR APPOINTMENTS Come back for  A CHECK UP IN 6-8 MONTHS

## 2020-05-20 ENCOUNTER — Telehealth: Payer: Self-pay | Admitting: Medical Oncology

## 2020-05-20 NOTE — Assessment & Plan Note (Signed)
Tdap, Shingrix, PNM 23: Reluctant, benefits discussed before Prevnar: 2016 Zostavax 12-2013 COVID vaccine x2 Flu shot: Reluctant to proceed CCS: Cscope , Dr Collene Mares [phone], 1 polyp, tubular adenoma, patient reluctant to proceed. Saw gynecology 05/13/2020, Pap negative MMG: 03/28/2020 DEXA @ gyn: 10-2013, 12-2016 (osteopenia)

## 2020-05-20 NOTE — Assessment & Plan Note (Signed)
Neutropenic fever: Recently admitted to the hospital, cultures negative, she is now afebrile, will see hematology in 3 days. Recommend to seek medical help sooner if needed. I reviewed the CTs done after the admission for surveillance, they report new patchy consolidation and groundglass opacity, discussed informally with oncology, no need to pursue antibiotics at this point if she is asymptomatic.  Appreciate their help. Rectal prolapse?  Also, patient reports that for a while she noticed her rectum gets out perhaps 1 or 2 cm with cough or Valsalva, it is easily pushed back in by the patient, states that she already discussed that with gynecology. Advised patient I could refer her to surgery for further evaluation but states she most likely won't consent to surgery. We also agreed that the best thing to do for now is control her cough to minimize the prolapse. If she has a large or nonreducible rectal prolapse needs to seek medical attention. Preventive care reviewed RTC 6 to 8 months

## 2020-05-20 NOTE — Telephone Encounter (Signed)
Still feels weak-asking if she should cancel appts this week.  I told her to keep appts on Thursday to see [facility].

## 2020-05-22 ENCOUNTER — Other Ambulatory Visit: Payer: Self-pay

## 2020-05-22 ENCOUNTER — Other Ambulatory Visit: Payer: Self-pay | Admitting: Medical Oncology

## 2020-05-22 ENCOUNTER — Inpatient Hospital Stay: Payer: Medicare Other

## 2020-05-22 ENCOUNTER — Inpatient Hospital Stay: Payer: Medicare Other | Attending: Internal Medicine | Admitting: Internal Medicine

## 2020-05-22 ENCOUNTER — Encounter: Payer: Self-pay | Admitting: Internal Medicine

## 2020-05-22 VITALS — BP 150/77 | HR 86 | Temp 98.0°F | Resp 18 | Ht 63.0 in | Wt 141.5 lb

## 2020-05-22 DIAGNOSIS — T451X5A Adverse effect of antineoplastic and immunosuppressive drugs, initial encounter: Secondary | ICD-10-CM | POA: Diagnosis not present

## 2020-05-22 DIAGNOSIS — Z7952 Long term (current) use of systemic steroids: Secondary | ICD-10-CM | POA: Diagnosis not present

## 2020-05-22 DIAGNOSIS — D6481 Anemia due to antineoplastic chemotherapy: Secondary | ICD-10-CM | POA: Insufficient documentation

## 2020-05-22 DIAGNOSIS — Z79899 Other long term (current) drug therapy: Secondary | ICD-10-CM | POA: Diagnosis not present

## 2020-05-22 DIAGNOSIS — E7800 Pure hypercholesterolemia, unspecified: Secondary | ICD-10-CM | POA: Insufficient documentation

## 2020-05-22 DIAGNOSIS — Z5111 Encounter for antineoplastic chemotherapy: Secondary | ICD-10-CM

## 2020-05-22 DIAGNOSIS — J90 Pleural effusion, not elsewhere classified: Secondary | ICD-10-CM

## 2020-05-22 DIAGNOSIS — I10 Essential (primary) hypertension: Secondary | ICD-10-CM | POA: Insufficient documentation

## 2020-05-22 DIAGNOSIS — Z923 Personal history of irradiation: Secondary | ICD-10-CM | POA: Insufficient documentation

## 2020-05-22 DIAGNOSIS — C7951 Secondary malignant neoplasm of bone: Secondary | ICD-10-CM

## 2020-05-22 DIAGNOSIS — C3431 Malignant neoplasm of lower lobe, right bronchus or lung: Secondary | ICD-10-CM | POA: Insufficient documentation

## 2020-05-22 DIAGNOSIS — Z9221 Personal history of antineoplastic chemotherapy: Secondary | ICD-10-CM | POA: Diagnosis not present

## 2020-05-22 DIAGNOSIS — C3491 Malignant neoplasm of unspecified part of right bronchus or lung: Secondary | ICD-10-CM

## 2020-05-22 DIAGNOSIS — Z95828 Presence of other vascular implants and grafts: Secondary | ICD-10-CM

## 2020-05-22 DIAGNOSIS — M8580 Other specified disorders of bone density and structure, unspecified site: Secondary | ICD-10-CM | POA: Diagnosis not present

## 2020-05-22 DIAGNOSIS — C3490 Malignant neoplasm of unspecified part of unspecified bronchus or lung: Secondary | ICD-10-CM

## 2020-05-22 LAB — CBC WITH DIFFERENTIAL (CANCER CENTER ONLY)
Abs Immature Granulocytes: 0.08 10*3/uL — ABNORMAL HIGH (ref 0.00–0.07)
Basophils Absolute: 0 10*3/uL (ref 0.0–0.1)
Basophils Relative: 0 %
Eosinophils Absolute: 0 10*3/uL (ref 0.0–0.5)
Eosinophils Relative: 0 %
HCT: 28.6 % — ABNORMAL LOW (ref 36.0–46.0)
Hemoglobin: 9.1 g/dL — ABNORMAL LOW (ref 12.0–15.0)
Immature Granulocytes: 1 %
Lymphocytes Relative: 8 %
Lymphs Abs: 0.7 10*3/uL (ref 0.7–4.0)
MCH: 31.8 pg (ref 26.0–34.0)
MCHC: 31.8 g/dL (ref 30.0–36.0)
MCV: 100 fL (ref 80.0–100.0)
Monocytes Absolute: 0.9 10*3/uL (ref 0.1–1.0)
Monocytes Relative: 10 %
Neutro Abs: 7.4 10*3/uL (ref 1.7–7.7)
Neutrophils Relative %: 81 %
Platelet Count: 432 10*3/uL — ABNORMAL HIGH (ref 150–400)
RBC: 2.86 MIL/uL — ABNORMAL LOW (ref 3.87–5.11)
RDW: 16.7 % — ABNORMAL HIGH (ref 11.5–15.5)
WBC Count: 9.1 10*3/uL (ref 4.0–10.5)
nRBC: 0 % (ref 0.0–0.2)

## 2020-05-22 LAB — CMP (CANCER CENTER ONLY)
ALT: 15 U/L (ref 0–44)
AST: 26 U/L (ref 15–41)
Albumin: 2.9 g/dL — ABNORMAL LOW (ref 3.5–5.0)
Alkaline Phosphatase: 102 U/L (ref 38–126)
Anion gap: 8 (ref 5–15)
BUN: 16 mg/dL (ref 8–23)
CO2: 25 mmol/L (ref 22–32)
Calcium: 10.5 mg/dL — ABNORMAL HIGH (ref 8.9–10.3)
Chloride: 102 mmol/L (ref 98–111)
Creatinine: 1.09 mg/dL — ABNORMAL HIGH (ref 0.44–1.00)
GFR, Estimated: 54 mL/min — ABNORMAL LOW (ref >60)
Glucose, Bld: 111 mg/dL — ABNORMAL HIGH (ref 70–99)
Potassium: 3.9 mmol/L (ref 3.5–5.1)
Sodium: 135 mmol/L (ref 135–145)
Total Bilirubin: 0.4 mg/dL (ref 0.3–1.2)
Total Protein: 7.3 g/dL (ref 6.5–8.1)

## 2020-05-22 MED ORDER — SODIUM CHLORIDE 0.9% FLUSH
10.0000 mL | Freq: Once | INTRAVENOUS | Status: AC
  Administered 2020-05-22: 10 mL via INTRAVENOUS
  Filled 2020-05-22: qty 10

## 2020-05-22 MED ORDER — FOLIC ACID 1 MG PO TABS: 1.0000 mg | ORAL_TABLET | Freq: Every day | ORAL | 0 refills | Status: DC

## 2020-05-22 NOTE — Patient Instructions (Signed)

## 2020-05-22 NOTE — Progress Notes (Signed)
[place] [phone]) [phone]   Fax:([phone]) [place], MD [place] [address] [place] [address]  DIAGNOSIS: Stage IV (T1b, N2, M1b) non-small cell lung cancer, adenocarcinoma diagnosed in March 2017 and presented with right lower lobe lung nodule in addition to mediastinal lymphadenopathy and metastatic bone lesions.  Molecular studies: PDL 1 TPS  <1%.   [facility] Studies: Positive for ERBB2 [id number]. Negative for EGFR, KRAS, ALK, BRAF, MET, RET and ROS1.  PRIOR THERAPY:  1) Induction systemic chemotherapy with carboplatin for AUC of 5 and Alimta 500 MG/M2 every 3 weeks is status post 6 cycles at the [facility], last dose was given 03/05/2016 with stable disease. 2) palliative radiation to the metastatic bone disease in the lower back and pelvic area.  CURRENT THERAPY::  1)  Maintenance systemic chemotherapy with Alimta 500 MG/M2 every 3 weeks status post 72 cycles. First dose was given 03/25/2016.  2) Zometa 4 mg IV every 12 week for metastatic bone disease.  INTERVAL HIST Valerie Long 74 y.o. female returns to the clinic today for follow-up visit accompanied by her sister.  The patient continues to complain of increasing fatigue and weakness.  She was recently admitted to [facility] with neutropenic fever.  She was treated with Granix and she felt much better.  She also received 2 units of PRBCs transfusion during her hospitalization.  The patient denied having any current chest pain but has shortness of breath with exertion with no cough or hemoptysis.  She continues to have the low back pain.  She denied having any nausea, vomiting, diarrhea or constipation.  She has no headache or visual changes.  She had repeat CT scan of the chest, abdomen pelvis performed during her hospitalization and she is here for evaluation and discussion of her scan results and treatment options.  MEDICAL  HISTORY: Past Medical History:  Diagnosis Date  . Adenocarcinoma of right lung, stage 4 ([place]) 2017  . Anemia   . Arthritis   . Bone metastases ([place]) 02/24/2017  . Encounter for antineoplastic chemotherapy 03/17/2016  . Hypercholesterolemia   . Hypertension 06/18/2016  . Osteopenia   . 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 Pneumonia   . Shortness of breath dyspnea     ALLERGIES:  has No Known Allergies.  MEDICATIONS:  Current Outpatient Medications  Medication Sig Dispense Refill  . Cholecalciferol (VITAMIN D3 PO) Take 2 tablets by mouth daily.    Marland Kitchen dexamethasone (DECADRON) 4 MG tablet Take 4 mg by mouth 2 (two) times daily with a meal. Take 2 tablets the day before the day of and day after    . dextromethorphan-guaiFENesin (MUCINEX DM) 30-600 MG 12hr tablet Take 1 tablet by mouth 2 (two) times daily.    . Ferrous Sulfate (IRON PO) Take 1 tablet by mouth daily.    . folic acid (FOLVITE) 1 MG tablet Take 1 tablet (1 mg total) by mouth daily. 90 tablet 0  . gabapentin (NEURONTIN) 300 MG capsule Take 200 mg by mouth 3 (three) times daily.    Marland Kitchen lidocaine-prilocaine (EMLA) cream Apply 1 application topically as needed.    . Omega-3 1000 MG CAPS     . docusate sodium (COLACE) 100 MG capsule take (Patient not taking: Reported on 05/22/2020)    . Meloxicam 10 MG CAPS  (Patient not taking: Reported on 05/22/2020)  No current facility-administered medications for this visit.   Facility-Administered Medications Ordered in Other Visits  Medication Dose Route Frequency Provider Last Rate Last Admin  . sodium chloride flush (NS) 0.9 % injection 10 mL  10 mL Intracatheter PRN Curt Bears, MD   10 mL at 11/15/19 1400    SURGICAL HISTORY:  Past Surgical History:  Procedure Laterality Date  . CESAREAN SECTION     myomectomy  . COLONOSCOPY W/ POLYPECTOMY    . IR GENERIC HISTORICAL  08/17/2016   IR US GUIDE VASC ACCESS RIGHT 08/17/2016 [name], MD [facility]   . IR GENERIC HISTORICAL  08/17/2016   IR FLUORO GUIDE PORT INSERTION RIGHT 08/17/2016 [name], MD [facility]  . MEDIASTINOSCOPY N/A 09/25/2015   Procedure: MEDIASTINOSCOPY;  Surgeon: Melrose Nakayama, MD;  Location: [place];  Service: Thoracic;  Laterality: N/A;  . VIDEO BRONCHOSCOPY Bilateral 08/19/2015   Procedure: VIDEO BRONCHOSCOPY WITH FLUORO;  Surgeon: Rigoberto Noel, MD;  Location: [place];  Service: Cardiopulmonary;  Laterality: Bilateral;  . VIDEO BRONCHOSCOPY N/A 09/08/2015   Procedure: VIDEO BRONCHOSCOPY WITH FLUORO;  Surgeon: Rigoberto Noel, MD;  Location: [place];  Service: Thoracic;  Laterality: N/A;  . VIDEO BRONCHOSCOPY WITH ENDOBRONCHIAL ULTRASOUND Right 09/08/2015   Procedure: ATTEMPTED VIDEO BRONCHOSCOPY ENDOBRONCHIAL ULTRASOUND  ;  Surgeon: Rigoberto Noel, MD;  Location: [place];  Service: Thoracic;  Laterality: Right;    REVIEW OF SYSTEMS:  Constitutional: positive for fatigue Eyes: negative Ears, nose, mouth, throat, and face: negative Respiratory: positive for dyspnea on exertion Cardiovascular: negative Gastrointestinal: negative Genitourinary:negative Integument/breast: negative Hematologic/lymphatic: negative Musculoskeletal:positive for back pain Neurological: negative Behavioral/Psych: negative Endocrine: negative Allergic/Immunologic: negative   PHYSICAL EXAMINATION: General appearance: alert, cooperative, fatigued and no distress Head: Normocephalic, without obvious abnormality, atraumatic Neck: no adenopathy, no JVD, supple, symmetrical, trachea midline and thyroid not enlarged, symmetric, no tenderness/mass/nodules Lymph nodes: Cervical, supraclavicular, and axillary nodes normal. Resp: clear to auscultation bilaterally Back: symmetric, no curvature. ROM normal. No CVA tenderness. Cardio: regular rate and rhythm, S1, S2 normal, no murmur, click, rub or gallop GI: soft, non-tender; bowel sounds normal; no masses,  no organomegaly Extremities:  extremities normal, atraumatic, no cyanosis or edema Neurologic: Alert and oriented X 3, normal strength and tone. Normal symmetric reflexes. Normal coordination and gait  ECOG PERFORMANCE STATUS: 1 - Symptomatic but completely ambulatory  Blood pressure (!) 150/77, pulse 86, temperature 98 F (36.7 C), temperature source Tympanic, resp. rate 18, height _0  (1.6 m), weight 141 lb 8 oz (64.2 kg), last menstrual period 06/09/1998, SpO2 100 %.  LABORATORY DATA: Lab Results  Component Value Date   WBC 9.1 05/22/2020   HGB 9.1 (L) 05/22/2020   HCT 28.6 (L) 05/22/2020   MCV 100.0 05/22/2020   PLT 432 (H) 05/22/2020      Chemistry      Component Value Date/Time   NA 135 05/22/2020 0830   NA 137 06/09/2017 0901   K 3.9 05/22/2020 0830   K 3.9 06/09/2017 0901   CL 102 05/22/2020 0830   CO2 25 05/22/2020 0830   CO2 25 06/09/2017 0901   BUN 16 05/22/2020 0830   BUN 19.0 06/09/2017 0901   CREATININE 1.09 (H) 05/22/2020 0830   CREATININE 1.2 (H) 06/09/2017 0901   GLU 170 01/23/2016 0000      Component Value Date/Time   CALCIUM 10.5 (H) 05/22/2020 0830   CALCIUM 9.9 06/09/2017 0901   ALKPHOS 102 05/22/2020 0830   ALKPHOS 63 06/09/2017 0901  AST 26 05/22/2020 0830   AST 35 (H) 06/09/2017 0901   ALT 15 05/22/2020 0830   ALT 23 06/09/2017 0901   BILITOT 0.4 05/22/2020 0830   BILITOT 0.65 06/09/2017 0901       RADIOGRAPHIC STUDIES:  CT Chest W Contrast  Result Date: 05/16/2020 CLINICAL DATA:  Primary Cancer Type: Lung Imaging Indication: Routine surveillance Interval therapy since last imaging? Yes Initial Cancer Diagnosis Date: 09/25/2015; Established by: Biopsy-proven Detailed Pathology: Stage IV non-small cell lung cancer, adenocarcinoma. Primary Tumor location: Right lower lobe.  Metastatic bone lesions. Surgeries: No. Chemotherapy: Yes; Ongoing? Yes; Most recent administration: 04/30/2020 Immunotherapy? No Radiation therapy? Yes; Date Range: 04/14/2017 - 04/26/2017; Target:  Lumbar spine EXAM: CT CHEST, ABDOMEN, AND PELVIS WITH CONTRAST TECHNIQUE: Multidetector CT imaging of the chest, abdomen and pelvis was performed following the standard protocol during bolus administration of intravenous contrast. CONTRAST:  161m OMNIPAQUE IOHEXOL 300 MG/ML  SOLN COMPARISON:  Most recent CT chest, abdomen and pelvis 02/25/2020. 08/21/2015 PET-CT. FINDINGS: CT CHEST FINDINGS Cardiovascular: Top-normal heart size. No significant pericardial effusion/thickening. Right internal jugular Port-A-Cath terminates at the cavoatrial junction. Atherosclerotic nonaneurysmal thoracic aorta. Normal caliber pulmonary arteries. No central pulmonary emboli. Mediastinum/Nodes: No discrete thyroid nodules. Unremarkable esophagus. No pathologically enlarged axillary, mediastinal or hilar lymph nodes. Lungs/Pleura: No pneumothorax. Moderate dependent/subpulmonic right pleural effusion, increased. No left pleural effusion. Anterior right lower lobe 3.6 x 3.3 cm lung mass (series 6/image 84), previously 3.6 x 3.3 cm using similar measurement technique, stable. Numerous (at least 15) poorly marginated subsolid pulmonary nodules scattered throughout both lungs, mildly enlarged. Representative 2.1 x 1.7 cm right upper lobe nodule (series 6/image 43), previously 1.8 x 1.5 cm using similar measurement technique. Representative 2.1 x 1.4 cm superior segment left lower lobe nodule (series 6/image 53), previously 1.9 x 1.1 cm. Representative 1.8 x 1.3 cm left upper lobe nodule (series 6/image 66), previously 1.6 x 1.0 cm. New indistinct patchy consolidation and ground-glass opacity in the left greater than right lower lobes (series 6/image 72). Musculoskeletal: Innumerable scattered small sclerotic lesions throughout the thoracic spine, sternum, bilateral ribs and bilateral shoulder girdles, not appreciably changed. Mild thoracic spondylosis. CT ABDOMEN PELVIS FINDINGS Hepatobiliary: Normal liver size. Stable scattered  subcentimeter hypodense liver lesions, too small to characterize. No new liver lesions. Cholelithiasis. No biliary ductal dilatation. Pancreas: Normal, with no mass or duct dilation. Spleen: Normal size. No mass. Adrenals/Urinary Tract: Normal adrenals. Normal right kidney with no right hydronephrosis and no right renal masses. Ectopic malrotated upper left pelvic kidney with no left hydronephrosis and no left renal mass. Normal bladder. Stomach/Bowel: Normal non-distended stomach. Normal caliber small bowel with no small bowel wall thickening. Normal appendix. Normal large bowel with no diverticulosis, large bowel wall thickening or pericolonic fat stranding. Vascular/Lymphatic: Atherosclerotic nonaneurysmal abdominal aorta. Patent portal, splenic, hepatic and renal veins. No pathologically enlarged lymph nodes in the abdomen or pelvis. Reproductive: Grossly normal uterus.  No adnexal mass. Other: No pneumoperitoneum, ascites or focal fluid collection. Musculoskeletal: Innumerable small sclerotic lesions scattered throughout the lumbar spine, sacrum and bilateral pelvic girdle, not appreciably changed. Moderate lumbar spondylosis with moderate levoscoliosis of the lumbar spine. IMPRESSION: 1. Mild progression of numerous bilateral subsolid pulmonary metastases. Dominant anterior right lower lobe lung mass is stable. 2. Moderate right pleural effusion, increased. 3. New indistinct patchy consolidation and ground-glass opacity in the left greater than right lower lobes, favor a nonspecific infectious or inflammatory etiology, with differential including pneumonia or drug toxicity. 4. Widespread small sclerotic bone metastases,  unchanged. 5. Aortic Atherosclerosis (ICD10-I70.0). Electronically Signed   By: Ilona Sorrel M.D.   On: 05/16/2020 10:35   CT Abdomen Pelvis W Contrast  Result Date: 05/16/2020 CLINICAL DATA:  Primary Cancer Type: Lung Imaging Indication: Routine surveillance Interval therapy since last  imaging? Yes Initial Cancer Diagnosis Date: 09/25/2015; Established by: Biopsy-proven Detailed Pathology: Stage IV non-small cell lung cancer, adenocarcinoma. Primary Tumor location: Right lower lobe.  Metastatic bone lesions. Surgeries: No. Chemotherapy: Yes; Ongoing? Yes; Most recent administration: 04/30/2020 Immunotherapy? No Radiation therapy? Yes; Date Range: 04/14/2017 - 04/26/2017; Target: Lumbar spine EXAM: CT CHEST, ABDOMEN, AND PELVIS WITH CONTRAST TECHNIQUE: Multidetector CT imaging of the chest, abdomen and pelvis was performed following the standard protocol during bolus administration of intravenous contrast. CONTRAST:  142m OMNIPAQUE IOHEXOL 300 MG/ML  SOLN COMPARISON:  Most recent CT chest, abdomen and pelvis 02/25/2020. 08/21/2015 PET-CT. FINDINGS: CT CHEST FINDINGS Cardiovascular: Top-normal heart size. No significant pericardial effusion/thickening. Right internal jugular Port-A-Cath terminates at the cavoatrial junction. Atherosclerotic nonaneurysmal thoracic aorta. Normal caliber pulmonary arteries. No central pulmonary emboli. Mediastinum/Nodes: No discrete thyroid nodules. Unremarkable esophagus. No pathologically enlarged axillary, mediastinal or hilar lymph nodes. Lungs/Pleura: No pneumothorax. Moderate dependent/subpulmonic right pleural effusion, increased. No left pleural effusion. Anterior right lower lobe 3.6 x 3.3 cm lung mass (series 6/image 84), previously 3.6 x 3.3 cm using similar measurement technique, stable. Numerous (at least 15) poorly marginated subsolid pulmonary nodules scattered throughout both lungs, mildly enlarged. Representative 2.1 x 1.7 cm right upper lobe nodule (series 6/image 43), previously 1.8 x 1.5 cm using similar measurement technique. Representative 2.1 x 1.4 cm superior segment left lower lobe nodule (series 6/image 53), previously 1.9 x 1.1 cm. Representative 1.8 x 1.3 cm left upper lobe nodule (series 6/image 66), previously 1.6 x 1.0 cm. New indistinct  patchy consolidation and ground-glass opacity in the left greater than right lower lobes (series 6/image 72). Musculoskeletal: Innumerable scattered small sclerotic lesions throughout the thoracic spine, sternum, bilateral ribs and bilateral shoulder girdles, not appreciably changed. Mild thoracic spondylosis. CT ABDOMEN PELVIS FINDINGS Hepatobiliary: Normal liver size. Stable scattered subcentimeter hypodense liver lesions, too small to characterize. No new liver lesions. Cholelithiasis. No biliary ductal dilatation. Pancreas: Normal, with no mass or duct dilation. Spleen: Normal size. No mass. Adrenals/Urinary Tract: Normal adrenals. Normal right kidney with no right hydronephrosis and no right renal masses. Ectopic malrotated upper left pelvic kidney with no left hydronephrosis and no left renal mass. Normal bladder. Stomach/Bowel: Normal non-distended stomach. Normal caliber small bowel with no small bowel wall thickening. Normal appendix. Normal large bowel with no diverticulosis, large bowel wall thickening or pericolonic fat stranding. Vascular/Lymphatic: Atherosclerotic nonaneurysmal abdominal aorta. Patent portal, splenic, hepatic and renal veins. No pathologically enlarged lymph nodes in the abdomen or pelvis. Reproductive: Grossly normal uterus.  No adnexal mass. Other: No pneumoperitoneum, ascites or focal fluid collection. Musculoskeletal: Innumerable small sclerotic lesions scattered throughout the lumbar spine, sacrum and bilateral pelvic girdle, not appreciably changed. Moderate lumbar spondylosis with moderate levoscoliosis of the lumbar spine. IMPRESSION: 1. Mild progression of numerous bilateral subsolid pulmonary metastases. Dominant anterior right lower lobe lung mass is stable. 2. Moderate right pleural effusion, increased. 3. New indistinct patchy consolidation and ground-glass opacity in the left greater than right lower lobes, favor a nonspecific infectious or inflammatory etiology, with  differential including pneumonia or drug toxicity. 4. Widespread small sclerotic bone metastases, unchanged. 5. Aortic Atherosclerosis (ICD10-I70.0). Electronically Signed   By: JIlona SorrelM.D.   On: 05/16/2020 10:35  DG Chest Port 1 View  Result Date: 05/07/2020 CLINICAL DATA:  Fever and cough for 2 weeks. EXAM: PORTABLE CHEST 1 VIEW COMPARISON:  04/21/2020 and older studies. FINDINGS: Masslike opacity at the right lung base, adjacent linear opacities and associated right pleural effusion are stable from the prior study. Remainder of the right lung is clear. Subtle small opacity in the left mid lung likely reflects 1 of the nodules seen on the previous CT, 02/25/2020. Left lung is otherwise clear. No convincing left pleural effusion. No pneumothorax. Cardiac silhouette normal in size.  No mediastinal or hilar masses. Right anterior chest wall Port-A-Cath is stable. No acute skeletal abnormality. Sclerotic lesions noted on the prior CT are not visualized on the AP portable semi-erect study. IMPRESSION: 1. No significant change from the most recent prior exam, allowing for differences in patient positioning and technique. 2. The right lung base mass is without substantial change. Smaller lung nodules noted on the prior chest CT are not well visualized on the current portable chest radiograph. Stable right pleural effusion. Electronically Signed   By: Lajean Manes M.D.   On: 05/07/2020 16:35    ASSESSMENT AND PLAN:  This is a very pleasant 74 years old Hispanic female with metastatic non-small cell lung cancer, adenocarcinoma diagnosed in March 2017 status post induction systemic chemotherapy with carboplatin and Alimta and she is currently on maintenance treatment with single agent Alimta status post 72 cycles. The patient continues to tolerate her treatment well except for fatigue from the chemotherapy-induced anemia. She was admitted recently to the hospital with neutropenic fever treated with Granix.   The patient is feeling much better today but she has residual fatigue and weakness. She had repeat CT scan of the chest, abdomen pelvis.  I personally and independently reviewed the scan images and discussed the results with the patient and her sister. Her scan showed a stable disease except for mild increase of some of the pulmonary nodules. I had a lengthy discussion with the patient today about her current condition and treatment options.  I gave her the option of continuous treatment with the current regimen and close monitoring on the upcoming imaging studies versus switching treatment to second line immunotherapy with nivolumab. The patient would like to continue her current treatment with Alimta for now.  We will delay the start of cycle number 73 x 2 weeks to start after Christmas and to give the patient more time to recover. For the anemia of chronic disease, she will continue on oral iron tablets and will consider the patient for transfusion if her hemoglobin is less than 8.0G/DL. For the chronic back pain she will continue her current treatment with Mobic and Lyrica. For the hypercalcemia I encouraged the patient to increase her oral intake and she is currently on Zometa every 12 weeks. The patient was advised to call immediately if she has any concerning symptoms in the interval. The patient voices understanding of current disease status and treatment options and is in agreement with the current care plan. All questions were answered. The patient knows to call the clinic with any problems, questions or concerns. We can certainly see the patient much sooner if necessary.  Disclaimer: This note was dictated with voice recognition software. Similar sounding words can inadvertently be transcribed and may not be corrected upon review.

## 2020-06-12 ENCOUNTER — Telehealth: Payer: Self-pay | Admitting: Internal Medicine

## 2020-06-12 ENCOUNTER — Other Ambulatory Visit: Payer: Medicare Other

## 2020-06-12 ENCOUNTER — Ambulatory Visit: Payer: Medicare Other | Admitting: Internal Medicine

## 2020-06-12 ENCOUNTER — Ambulatory Visit: Payer: Medicare Other

## 2020-06-12 NOTE — Telephone Encounter (Signed)
Scheduled appt per 1/5 sch msg - unable to reach pt . Left message with appt date and time

## 2020-06-16 ENCOUNTER — Inpatient Hospital Stay: Payer: Medicare Other

## 2020-06-16 ENCOUNTER — Inpatient Hospital Stay: Payer: Medicare Other | Attending: Internal Medicine

## 2020-06-16 ENCOUNTER — Other Ambulatory Visit: Payer: Self-pay

## 2020-06-16 ENCOUNTER — Inpatient Hospital Stay (HOSPITAL_BASED_OUTPATIENT_CLINIC_OR_DEPARTMENT_OTHER): Payer: Medicare Other | Admitting: Internal Medicine

## 2020-06-16 ENCOUNTER — Encounter: Payer: Self-pay | Admitting: Internal Medicine

## 2020-06-16 VITALS — BP 144/81 | HR 78 | Temp 97.9°F | Resp 18 | Ht 63.0 in | Wt 140.9 lb

## 2020-06-16 DIAGNOSIS — C3491 Malignant neoplasm of unspecified part of right bronchus or lung: Secondary | ICD-10-CM

## 2020-06-16 DIAGNOSIS — C3431 Malignant neoplasm of lower lobe, right bronchus or lung: Secondary | ICD-10-CM | POA: Diagnosis not present

## 2020-06-16 DIAGNOSIS — I10 Essential (primary) hypertension: Secondary | ICD-10-CM | POA: Diagnosis not present

## 2020-06-16 DIAGNOSIS — Z833 Family history of diabetes mellitus: Secondary | ICD-10-CM | POA: Insufficient documentation

## 2020-06-16 DIAGNOSIS — Z95828 Presence of other vascular implants and grafts: Secondary | ICD-10-CM

## 2020-06-16 DIAGNOSIS — Z5111 Encounter for antineoplastic chemotherapy: Secondary | ICD-10-CM | POA: Insufficient documentation

## 2020-06-16 DIAGNOSIS — D6481 Anemia due to antineoplastic chemotherapy: Secondary | ICD-10-CM | POA: Diagnosis not present

## 2020-06-16 DIAGNOSIS — Z7951 Long term (current) use of inhaled steroids: Secondary | ICD-10-CM | POA: Diagnosis not present

## 2020-06-16 DIAGNOSIS — Z8049 Family history of malignant neoplasm of other genital organs: Secondary | ICD-10-CM | POA: Diagnosis not present

## 2020-06-16 DIAGNOSIS — Z7952 Long term (current) use of systemic steroids: Secondary | ICD-10-CM | POA: Insufficient documentation

## 2020-06-16 DIAGNOSIS — C7951 Secondary malignant neoplasm of bone: Secondary | ICD-10-CM | POA: Diagnosis not present

## 2020-06-16 DIAGNOSIS — Z79899 Other long term (current) drug therapy: Secondary | ICD-10-CM | POA: Insufficient documentation

## 2020-06-16 DIAGNOSIS — Z8249 Family history of ischemic heart disease and other diseases of the circulatory system: Secondary | ICD-10-CM | POA: Insufficient documentation

## 2020-06-16 DIAGNOSIS — M8580 Other specified disorders of bone density and structure, unspecified site: Secondary | ICD-10-CM | POA: Diagnosis not present

## 2020-06-16 DIAGNOSIS — E7800 Pure hypercholesterolemia, unspecified: Secondary | ICD-10-CM | POA: Insufficient documentation

## 2020-06-16 DIAGNOSIS — Z803 Family history of malignant neoplasm of breast: Secondary | ICD-10-CM | POA: Diagnosis not present

## 2020-06-16 DIAGNOSIS — T451X5A Adverse effect of antineoplastic and immunosuppressive drugs, initial encounter: Secondary | ICD-10-CM

## 2020-06-16 DIAGNOSIS — D649 Anemia, unspecified: Secondary | ICD-10-CM | POA: Diagnosis not present

## 2020-06-16 LAB — CMP (CANCER CENTER ONLY)
ALT: 11 U/L (ref 0–44)
AST: 24 U/L (ref 15–41)
Albumin: 3 g/dL — ABNORMAL LOW (ref 3.5–5.0)
Alkaline Phosphatase: 112 U/L (ref 38–126)
Anion gap: 6 (ref 5–15)
BUN: 20 mg/dL (ref 8–23)
CO2: 27 mmol/L (ref 22–32)
Calcium: 9.7 mg/dL (ref 8.9–10.3)
Chloride: 104 mmol/L (ref 98–111)
Creatinine: 0.94 mg/dL (ref 0.44–1.00)
GFR, Estimated: >60 mL/min (ref >60)
Glucose, Bld: 93 mg/dL (ref 70–99)
Potassium: 3.9 mmol/L (ref 3.5–5.1)
Sodium: 137 mmol/L (ref 135–145)
Total Bilirubin: 0.5 mg/dL (ref 0.3–1.2)
Total Protein: 7.6 g/dL (ref 6.5–8.1)

## 2020-06-16 LAB — CBC WITH DIFFERENTIAL (CANCER CENTER ONLY)
Abs Immature Granulocytes: 0.03 10*3/uL (ref 0.00–0.07)
Basophils Absolute: 0 10*3/uL (ref 0.0–0.1)
Basophils Relative: 0 %
Eosinophils Absolute: 0 10*3/uL (ref 0.0–0.5)
Eosinophils Relative: 0 %
HCT: 30 % — ABNORMAL LOW (ref 36.0–46.0)
Hemoglobin: 9.8 g/dL — ABNORMAL LOW (ref 12.0–15.0)
Immature Granulocytes: 0 %
Lymphocytes Relative: 8 %
Lymphs Abs: 0.6 10*3/uL — ABNORMAL LOW (ref 0.7–4.0)
MCH: 31.7 pg (ref 26.0–34.0)
MCHC: 32.7 g/dL (ref 30.0–36.0)
MCV: 97.1 fL (ref 80.0–100.0)
Monocytes Absolute: 0.7 10*3/uL (ref 0.1–1.0)
Monocytes Relative: 10 %
Neutro Abs: 5.7 10*3/uL (ref 1.7–7.7)
Neutrophils Relative %: 82 %
Platelet Count: 233 10*3/uL (ref 150–400)
RBC: 3.09 MIL/uL — ABNORMAL LOW (ref 3.87–5.11)
RDW: 14.8 % (ref 11.5–15.5)
WBC Count: 7 10*3/uL (ref 4.0–10.5)
nRBC: 0 % (ref 0.0–0.2)

## 2020-06-16 MED ORDER — SODIUM CHLORIDE 0.9% FLUSH
10.0000 mL | INTRAVENOUS | Status: DC | PRN
  Administered 2020-06-16: 10 mL via INTRAVENOUS
  Filled 2020-06-16: qty 10

## 2020-06-16 MED ORDER — PROCHLORPERAZINE MALEATE 10 MG PO TABS
ORAL_TABLET | ORAL | Status: AC
  Filled 2020-06-16: qty 1

## 2020-06-16 MED ORDER — PROCHLORPERAZINE MALEATE 10 MG PO TABS
10.0000 mg | ORAL_TABLET | Freq: Once | ORAL | Status: AC
  Administered 2020-06-16: 10 mg via ORAL

## 2020-06-16 MED ORDER — HEPARIN SOD (PORK) LOCK FLUSH 100 UNIT/ML IV SOLN
500.0000 [IU] | Freq: Once | INTRAVENOUS | Status: AC | PRN
  Administered 2020-06-16: 500 [IU]
  Filled 2020-06-16: qty 5

## 2020-06-16 MED ORDER — SODIUM CHLORIDE 0.9 % IV SOLN
Freq: Once | INTRAVENOUS | Status: AC
  Filled 2020-06-16: qty 250

## 2020-06-16 MED ORDER — SODIUM CHLORIDE 0.9% FLUSH
10.0000 mL | INTRAVENOUS | Status: DC | PRN
  Administered 2020-06-16: 10 mL
  Filled 2020-06-16: qty 10

## 2020-06-16 MED ORDER — CYANOCOBALAMIN 1000 MCG/ML IJ SOLN
INTRAMUSCULAR | Status: AC
  Filled 2020-06-16: qty 1

## 2020-06-16 MED ORDER — SODIUM CHLORIDE 0.9 % IV SOLN
500.0000 mg/m2 | Freq: Once | INTRAVENOUS | Status: AC
  Administered 2020-06-16: 900 mg via INTRAVENOUS
  Filled 2020-06-16: qty 20

## 2020-06-16 MED ORDER — ZOLEDRONIC ACID 4 MG/100ML IV SOLN
4.0000 mg | Freq: Once | INTRAVENOUS | Status: AC
  Administered 2020-06-16: 4 mg via INTRAVENOUS

## 2020-06-16 MED ORDER — CYANOCOBALAMIN 1000 MCG/ML IJ SOLN
1000.0000 ug | Freq: Once | INTRAMUSCULAR | Status: AC
  Administered 2020-06-16: 1000 ug via INTRAMUSCULAR

## 2020-06-16 NOTE — Progress Notes (Signed)
[place] Telephone:([phone]) [phone]   Fax:(336) [place], MD [place] [address] [place] [address]  DIAGNOSIS: Stage IV (T1b, N2, M1b) non-small cell lung cancer, adenocarcinoma diagnosed in March 2017 and presented with right lower lobe lung nodule in addition to mediastinal lymphadenopathy and metastatic bone lesions.  Molecular studies: PDL 1 TPS  <1%.   Foundation One Studies: Positive for ERBB2 [id number]. Negative for EGFR, KRAS, ALK, BRAF, MET, RET and ROS1.  PRIOR THERAPY:  1) Induction systemic chemotherapy with carboplatin for AUC of 5 and Alimta 500 MG/M2 every 3 weeks is status post 6 cycles at the [facility], last dose was given 03/05/2016 with stable disease. 2) palliative radiation to the metastatic bone disease in the lower back and pelvic area.  CURRENT THERAPY::  1)  Maintenance systemic chemotherapy with Alimta 500 MG/M2 every 3 weeks status post 72 cycles. First dose was given 03/25/2016.  2) Zometa 4 mg IV every 12 week for metastatic bone disease.  INTERVAL HIST Mekaila Long 75 y.o. female returns to the clinic today for follow-up visit accompanied by her sister. The patient is feeling fine today with no concerning complaints except for the baseline fatigue and low back pain. She also had some shaking movement in her hand when she was taking high-dose of Neurontin and this is improved after reducing the dose to 100 mg p.o. 3 times daily. She denied having any current chest pain, shortness of breath, cough or hemoptysis. She denied having any fever or chills. She has no nausea, vomiting, diarrhea or constipation. She denied having any headache or visual changes. She is here today for evaluation before starting cycle #73.  MEDICAL HISTORY: Past Medical History:  Diagnosis Date  . Adenocarcinoma of right lung, stage 4 ([place]) 2017  . Anemia   . Arthritis   . Bone metastases ([place])  02/24/2017  . Encounter for antineoplastic chemotherapy 03/17/2016  . Hypercholesterolemia   . Hypertension 06/18/2016  . Osteopenia   . 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 Pneumonia   . Shortness of breath dyspnea     ALLERGIES:  has No Known Allergies.  MEDICATIONS:  Current Outpatient Medications  Medication Sig Dispense Refill  . acetaminophen (TYLENOL) 500 MG tablet Take 1,000 mg by mouth every 6 (six) hours as needed.    . budesonide-formoterol (SYMBICORT) 80-4.5 MCG/ACT inhaler Inhale 2 puffs into the lungs 2 (two) times daily.    . Cholecalciferol (VITAMIN D3 PO) Take 2 tablets by mouth daily.    Marland Kitchen dexamethasone (DECADRON) 4 MG tablet Take 4 mg by mouth 2 (two) times daily with a meal. Take 2 tablets the day before the day of and day after    . dextromethorphan-guaiFENesin (MUCINEX DM) 30-600 MG 12hr tablet Take 1 tablet by mouth 2 (two) times daily.    Marland Kitchen docusate sodium (COLACE) 100 MG capsule take (Patient not taking: Reported on 05/22/2020)    . Ferrous Sulfate (IRON PO) Take 1 tablet by mouth daily.    . folic acid (FOLVITE) 1 MG tablet Take 1 tablet (1 mg total) by mouth daily. 90 tablet 0  . gabapentin (NEURONTIN) 300 MG capsule Take 200 mg by mouth 3 (three) times daily.    Marland Kitchen lidocaine-prilocaine (EMLA) cream Apply 1 application topically as needed.    . Meloxicam 10 MG CAPS  (Patient not taking: Reported on 05/22/2020)    .  Omega-3 1000 MG CAPS     . ondansetron (ZOFRAN) 8 MG tablet Take 8 mg by mouth every 8 (eight) hours as needed for nausea or vomiting.     No current facility-administered medications for this visit.   Facility-Administered Medications Ordered in Other Visits  Medication Dose Route Frequency Provider Last Rate Last Admin  . sodium chloride flush (NS) 0.9 % injection 10 mL  10 mL Intracatheter PRN Curt Bears, MD   10 mL at 11/15/19 1400  . sodium chloride flush (NS) 0.9 % injection 10 mL  10 mL Intravenous PRN Curt Bears, MD   10 mL at 06/16/20 1138    SURGICAL HISTORY:  Past Surgical History:  Procedure Laterality Date  . CESAREAN SECTION     myomectomy  . COLONOSCOPY W/ POLYPECTOMY    . IR GENERIC HISTORICAL  08/17/2016   IR US GUIDE VASC ACCESS RIGHT 08/17/2016 [name], MD [facility]  . IR GENERIC HISTORICAL  08/17/2016   IR FLUORO GUIDE PORT INSERTION RIGHT 08/17/2016 [name], MD [facility]  . MEDIASTINOSCOPY N/A 09/25/2015   Procedure: MEDIASTINOSCOPY;  Surgeon: Melrose Nakayama, MD;  Location: [facility];  Service: Thoracic;  Laterality: N/A;  . VIDEO BRONCHOSCOPY Bilateral 08/19/2015   Procedure: VIDEO BRONCHOSCOPY WITH FLUORO;  Surgeon: Rigoberto Noel, MD;  Location: [place];  Service: Cardiopulmonary;  Laterality: Bilateral;  . VIDEO BRONCHOSCOPY N/A 09/08/2015   Procedure: VIDEO BRONCHOSCOPY WITH FLUORO;  Surgeon: Rigoberto Noel, MD;  Location: [facility];  Service: Thoracic;  Laterality: N/A;  . VIDEO BRONCHOSCOPY WITH ENDOBRONCHIAL ULTRASOUND Right 09/08/2015   Procedure: ATTEMPTED VIDEO BRONCHOSCOPY ENDOBRONCHIAL ULTRASOUND  ;  Surgeon: Rigoberto Noel, MD;  Location: [place];  Service: Thoracic;  Laterality: Right;    REVIEW OF SYSTEMS:  A comprehensive review of systems was negative except for: Constitutional: positive for fatigue Musculoskeletal: positive for back pain   PHYSICAL EXAMINATION: General appearance: alert, cooperative, fatigued and no distress Head: Normocephalic, without obvious abnormality, atraumatic Neck: no adenopathy, no JVD, supple, symmetrical, trachea midline and thyroid not enlarged, symmetric, no tenderness/mass/nodules Lymph nodes: Cervical, supraclavicular, and axillary nodes normal. Resp: clear to auscultation bilaterally Back: symmetric, no curvature. ROM normal. No CVA tenderness. Cardio: regular rate and rhythm, S1, S2 normal, no murmur, click, rub or gallop GI: soft, non-tender; bowel sounds normal; no masses,  no  organomegaly Extremities: extremities normal, atraumatic, no cyanosis or edema  ECOG PERFORMANCE STATUS: 1 - Symptomatic but completely ambulatory  Blood pressure (!) 144/81, pulse 78, temperature 97.9 F (36.6 C), temperature source Tympanic, resp. rate 18, height '5\' 3"'  (1.6 m), weight 140 lb 14.4 oz (63.9 kg), last menstrual period 06/09/1998, SpO2 100 %.  LABORATORY DATA: Lab Results  Component Value Date   WBC 9.1 05/22/2020   HGB 9.1 (L) 05/22/2020   HCT 28.6 (L) 05/22/2020   MCV 100.0 05/22/2020   PLT 432 (H) 05/22/2020      Chemistry      Component Value Date/Time   NA 135 05/22/2020 0830   NA 137 06/09/2017 0901   K 3.9 05/22/2020 0830   K 3.9 06/09/2017 0901   CL 102 05/22/2020 0830   CO2 25 05/22/2020 0830   CO2 25 06/09/2017 0901   BUN 16 05/22/2020 0830   BUN 19.0 06/09/2017 0901   CREATININE 1.09 (H) 05/22/2020 0830   CREATININE 1.2 (H) 06/09/2017 0901   GLU 170 01/23/2016 0000      Component Value Date/Time   CALCIUM 10.5 (H)  05/22/2020 0830   CALCIUM 9.9 06/09/2017 0901   ALKPHOS 102 05/22/2020 0830   ALKPHOS 63 06/09/2017 0901   AST 26 05/22/2020 0830   AST 35 (H) 06/09/2017 0901   ALT 15 05/22/2020 0830   ALT 23 06/09/2017 0901   BILITOT 0.4 05/22/2020 0830   BILITOT 0.65 06/09/2017 0901       RADIOGRAPHIC STUDIES:  No results found.  ASSESSMENT AND PLAN:  This is a very pleasant 75 years old Hispanic female with metastatic non-small cell lung cancer, adenocarcinoma diagnosed in March 2017 status post induction systemic chemotherapy with carboplatin and Alimta and she is currently on maintenance treatment with single agent Alimta status post 72 cycles. The patient continues to tolerate this treatment well with no concerning adverse effects. I recommended for her to proceed with cycle #73 today as planned. For the chronic back pain she will continue on Mobic and gabapentin. For the bone disease, the patient will continue her current treatment  with Zometa every 12 weeks. She will come back for follow-up visit in 3 weeks for evaluation before the next cycle of her treatment. She was advised to call immediately if she has any concerning symptoms in the interval. The patient voices understanding of current disease status and treatment options and is in agreement with the current care plan. All questions were answered. The patient knows to call the clinic with any problems, questions or concerns. We can certainly see the patient much sooner if necessary.  Disclaimer: This note was dictated with voice recognition software. Similar sounding words can inadvertently be transcribed and may not be corrected upon review.

## 2020-06-16 NOTE — Patient Instructions (Signed)

## 2020-06-16 NOTE — Patient Instructions (Signed)
Rule Discharge Instructions for Patients Receiving Chemotherapy  Today you received the following chemotherapy agents Alimta  To help prevent nausea and vomiting after your treatment, we encourage you to take your nausea medication as directed.  If you develop nausea and vomiting that is not controlled by your nausea medication, call the clinic.   BELOW ARE SYMPTOMS THAT SHOULD BE REPORTED IMMEDIATELY:  *FEVER GREATER THAN 100.5 F  *CHILLS WITH OR WITHOUT FEVER  NAUSEA AND VOMITING THAT IS NOT CONTROLLED WITH YOUR NAUSEA MEDICATION  *UNUSUAL SHORTNESS OF BREATH  *UNUSUAL BRUISING OR BLEEDING  TENDERNESS IN MOUTH AND THROAT WITH OR WITHOUT PRESENCE OF ULCERS  *URINARY PROBLEMS  *BOWEL PROBLEMS  UNUSUAL RASH Items with * indicate a potential emergency and should be followed up as soon as possible.  Feel free to call the clinic should you have any questions or concerns. The clinic phone number is [phone].  Please show the [place] at check-in to the Emergency Department and triage nurse.

## 2020-06-19 ENCOUNTER — Telehealth: Payer: Self-pay | Admitting: Internal Medicine

## 2020-06-19 NOTE — Telephone Encounter (Signed)
Scheduled per 1/10 los. Pt will receive an updated appt calendar per next visit appt notes

## 2020-06-26 ENCOUNTER — Inpatient Hospital Stay (HOSPITAL_BASED_OUTPATIENT_CLINIC_OR_DEPARTMENT_OTHER): Payer: Medicare Other | Admitting: Medical

## 2020-06-26 ENCOUNTER — Telehealth: Payer: Self-pay | Admitting: Medical Oncology

## 2020-06-26 ENCOUNTER — Ambulatory Visit (HOSPITAL_COMMUNITY)
Admission: RE | Admit: 2020-06-26 | Discharge: 2020-06-26 | Disposition: A | Discharge status: Home / Self Care | Payer: Medicare Other | Source: Ambulatory Visit | Attending: Medical | Admitting: Medical

## 2020-06-26 ENCOUNTER — Other Ambulatory Visit: Payer: Self-pay

## 2020-06-26 ENCOUNTER — Other Ambulatory Visit: Payer: Medicare Other

## 2020-06-26 VITALS — BP 136/86 | HR 112 | Temp 98.6°F | Resp 24 | Wt 141.2 lb

## 2020-06-26 DIAGNOSIS — C3491 Malignant neoplasm of unspecified part of right bronchus or lung: Secondary | ICD-10-CM

## 2020-06-26 DIAGNOSIS — E7800 Pure hypercholesterolemia, unspecified: Secondary | ICD-10-CM | POA: Diagnosis not present

## 2020-06-26 DIAGNOSIS — R071 Chest pain on breathing: Secondary | ICD-10-CM

## 2020-06-26 DIAGNOSIS — C7951 Secondary malignant neoplasm of bone: Secondary | ICD-10-CM | POA: Diagnosis not present

## 2020-06-26 DIAGNOSIS — R0602 Shortness of breath: Secondary | ICD-10-CM | POA: Diagnosis not present

## 2020-06-26 DIAGNOSIS — Z5111 Encounter for antineoplastic chemotherapy: Secondary | ICD-10-CM | POA: Diagnosis not present

## 2020-06-26 DIAGNOSIS — C3490 Malignant neoplasm of unspecified part of unspecified bronchus or lung: Secondary | ICD-10-CM | POA: Diagnosis not present

## 2020-06-26 DIAGNOSIS — R509 Fever, unspecified: Secondary | ICD-10-CM

## 2020-06-26 DIAGNOSIS — I10 Essential (primary) hypertension: Secondary | ICD-10-CM | POA: Diagnosis not present

## 2020-06-26 DIAGNOSIS — R079 Chest pain, unspecified: Secondary | ICD-10-CM | POA: Diagnosis not present

## 2020-06-26 DIAGNOSIS — J90 Pleural effusion, not elsewhere classified: Secondary | ICD-10-CM | POA: Diagnosis not present

## 2020-06-26 DIAGNOSIS — M8580 Other specified disorders of bone density and structure, unspecified site: Secondary | ICD-10-CM | POA: Diagnosis not present

## 2020-06-26 DIAGNOSIS — J9811 Atelectasis: Secondary | ICD-10-CM | POA: Diagnosis not present

## 2020-06-26 DIAGNOSIS — C3431 Malignant neoplasm of lower lobe, right bronchus or lung: Secondary | ICD-10-CM | POA: Diagnosis not present

## 2020-06-26 DIAGNOSIS — R911 Solitary pulmonary nodule: Secondary | ICD-10-CM | POA: Diagnosis not present

## 2020-06-26 LAB — CBC WITH DIFFERENTIAL (CANCER CENTER ONLY)
Abs Immature Granulocytes: 0.02 10*3/uL (ref 0.00–0.07)
Basophils Absolute: 0 10*3/uL (ref 0.0–0.1)
Basophils Relative: 0 %
Eosinophils Absolute: 0.1 10*3/uL (ref 0.0–0.5)
Eosinophils Relative: 3 %
HCT: 31.4 % — ABNORMAL LOW (ref 36.0–46.0)
Hemoglobin: 9.9 g/dL — ABNORMAL LOW (ref 12.0–15.0)
Immature Granulocytes: 1 %
Lymphocytes Relative: 26 %
Lymphs Abs: 0.7 10*3/uL (ref 0.7–4.0)
MCH: 30.7 pg (ref 26.0–34.0)
MCHC: 31.5 g/dL (ref 30.0–36.0)
MCV: 97.2 fL (ref 80.0–100.0)
Monocytes Absolute: 0.6 10*3/uL (ref 0.1–1.0)
Monocytes Relative: 24 %
Neutro Abs: 1.2 10*3/uL — ABNORMAL LOW (ref 1.7–7.7)
Neutrophils Relative %: 46 %
Platelet Count: 187 10*3/uL (ref 150–400)
RBC: 3.23 MIL/uL — ABNORMAL LOW (ref 3.87–5.11)
RDW: 14 % (ref 11.5–15.5)
WBC Count: 2.6 10*3/uL — ABNORMAL LOW (ref 4.0–10.5)
nRBC: 0 % (ref 0.0–0.2)

## 2020-06-26 LAB — CMP (CANCER CENTER ONLY)
ALT: 19 U/L (ref 0–44)
AST: 35 U/L (ref 15–41)
Albumin: 2.8 g/dL — ABNORMAL LOW (ref 3.5–5.0)
Alkaline Phosphatase: 117 U/L (ref 38–126)
Anion gap: 10 (ref 5–15)
BUN: 12 mg/dL (ref 8–23)
CO2: 27 mmol/L (ref 22–32)
Calcium: 9.8 mg/dL (ref 8.9–10.3)
Chloride: 100 mmol/L (ref 98–111)
Creatinine: 0.98 mg/dL (ref 0.44–1.00)
GFR, Estimated: >60 mL/min (ref >60)
Glucose, Bld: 100 mg/dL — ABNORMAL HIGH (ref 70–99)
Potassium: 5.1 mmol/L (ref 3.5–5.1)
Sodium: 137 mmol/L (ref 135–145)
Total Bilirubin: 0.4 mg/dL (ref 0.3–1.2)
Total Protein: 7.9 g/dL (ref 6.5–8.1)

## 2020-06-26 MED ORDER — AMOXICILLIN-POT CLAVULANATE 875-125 MG PO TABS: 1.0000 | ORAL_TABLET | Freq: Two times a day (BID) | ORAL | 0 refills | Status: DC

## 2020-06-26 NOTE — Telephone Encounter (Signed)
Pt did not show up for xray or [facility] today-I am unable to reach her or her sister now.

## 2020-06-26 NOTE — Telephone Encounter (Signed)
4 days of sharp pain in under left breast /rib area. She is also more short of breath.   Appt today with [facility] with cxr and labs first. Pt notified of appt.

## 2020-06-26 NOTE — Telephone Encounter (Signed)
No answer on pt phone.  I spoke to her sister and she will call pt.

## 2020-06-29 NOTE — Progress Notes (Signed)
[facility] Progress Note   Valerie Long [id number] May 04, 1946 75 y.o.  [name] is managed by Dr. Fanny Bien. Mohamed  Actively treated with chemotherapy/immunotherapy/hormonal therapy: yes  Current therapy: Alimta  Last treated: 06/16/2020 (cycle 73, day 1)  Next scheduled appointment with provider: 07/07/2020  Assessment: Plan:    Chest pain on breathing - Plan: amoxicillin-clavulanate (AUGMENTIN) 875-125 MG tablet  Adenocarcinoma of right lung, stage 4 (HCC)  Bone metastases (HCC)  Fever, unspecified fever cause - Plan: amoxicillin-clavulanate (AUGMENTIN) 875-125 MG tablet   Chest pain and fever in the setting of prior pneumonias: Patient was referred for chest x-ray.  She was given a prescription for Augmentin 875-125 p.o. twice daily x7 days.  Stage IV adenocarcinoma of the right lung with bone metastasis: The patient continues to be managed by Dr. Fanny Bien. Mohamed and is status post cycle 73, day 1 of Alimta which was dosed on 06/16/2020.  She is scheduled to return for follow-up on 07/07/2020.  Please see After Visit Summary for patient specific instructions.  Future Appointments  Date Time Provider [place]  07/07/2020 10:15 AM [facility] LAB [facility] None  07/07/2020 10:30 AM [facility] [place] FLUSH [facility] None  07/07/2020 11:00 AM Curt Bears, MD [facility] None  07/07/2020 11:30 AM [facility] INFUSION [facility] None  07/28/2020  9:45 AM [facility] LAB [facility] None  07/28/2020 10:00 AM [facility] [place] FLUSH [facility] None  07/28/2020 10:30 AM Curt Bears, MD [facility] None  07/28/2020 11:30 AM [facility] INFUSION [facility] None  08/18/2020 10:15 AM [facility] LAB [facility] None  08/18/2020 10:30 AM [facility] [place] FLUSH [facility] None  08/18/2020 11:00 AM Curt Bears, MD [facility] None  08/18/2020 12:00 PM [facility] INFUSION [facility] None    No orders of the defined types were placed in this  encounter.      Subjective:   Patient ID:  Valerie Long is a 75 y.o. (DOB [birth year]) female.  Chief Complaint: No chief complaint on file.   HPI Valerie Long  is a 75 y.o. female with a diagnosis of a stage IV adenocarcinoma of the right lung with bone metastasis.  She is managed by Dr. Julien Nordmann and is status post cycle 73, day 1 of Alimta which was dosed on 06/16/2020.  She has a history of recurrent pneumonias.  She is experiencing sharp pain in her left chest.  Last weekend she had fevers of 102.  This week she has had fevers no higher than 100.6.  She has a cough.  She denies any other issues of concern.  She has no dysuria.  She denies any sore throat.  She was referred for a chest x-ray which ultimately returned showing:  FINDINGS: The right-sided Port-A-Cath is in good position, unchanged.  Slightly larger moderate-sized right pleural effusion and new small left pleural effusion. Moderate atelectasis overlying the right effusion. Stable appearing right lower lobe mass.  Numerous metastatic pulmonary nodules are again demonstrated.  No pneumothorax.  The bony thorax is intact.  IMPRESSION: Slightly larger moderate-sized right pleural effusion with overlying atelectasis and right lower lobe lung mass.  Small left pleural effusion.  Stable appearing metastatic pulmonary nodules.  There was a delay getting the x-ray results back prior to the patient leaving.  She was thus begun on Augmentin 875-125 p.o. twice daily x7 days given her history of pneumonias.  Medications: I have reviewed the patient's current medications.  Allergies: No Known Allergies  Past Medical History:  Diagnosis Date  . Adenocarcinoma of right lung, stage 4 ([place]) 2017  .  Anemia   . Arthritis   . Bone metastases ([place]) 02/24/2017  . Encounter for antineoplastic chemotherapy 03/17/2016  . Hypercholesterolemia   . Hypertension 06/18/2016  . Osteopenia   . Pelvic kidney    Left. On CT in  [place]  . Pneumonia   . Shortness of breath dyspnea     Past Surgical History:  Procedure Laterality Date  . CESAREAN SECTION     myomectomy  . COLONOSCOPY W/ POLYPECTOMY    . IR GENERIC HISTORICAL  08/17/2016   IR US GUIDE VASC ACCESS RIGHT 08/17/2016 [name], MD [facility]  . IR GENERIC HISTORICAL  08/17/2016   IR FLUORO GUIDE PORT INSERTION RIGHT 08/17/2016 [name], MD [facility]  . MEDIASTINOSCOPY N/A 09/25/2015   Procedure: MEDIASTINOSCOPY;  Surgeon: Melrose Nakayama, MD;  Location: [place];  Service: Thoracic;  Laterality: N/A;  . VIDEO BRONCHOSCOPY Bilateral 08/19/2015   Procedure: VIDEO BRONCHOSCOPY WITH FLUORO;  Surgeon: Rigoberto Noel, MD;  Location: [place];  Service: Cardiopulmonary;  Laterality: Bilateral;  . VIDEO BRONCHOSCOPY N/A 09/08/2015   Procedure: VIDEO BRONCHOSCOPY WITH FLUORO;  Surgeon: Rigoberto Noel, MD;  Location: [facility] OR;  Service: Thoracic;  Laterality: N/A;  . VIDEO BRONCHOSCOPY WITH ENDOBRONCHIAL ULTRASOUND Right 09/08/2015   Procedure: ATTEMPTED VIDEO BRONCHOSCOPY ENDOBRONCHIAL ULTRASOUND  ;  Surgeon: Rigoberto Noel, MD;  Location: [facility];  Service: Thoracic;  Laterality: Right;    Family History  Problem Relation Age of Onset  . Hypertension Mother        F and M  . CAD Father   . Diabetes Other        auncle-aunts   . Cancer Other        UTERINE   . Breast cancer Sister   . Stroke Neg Hx   . Colon cancer Neg Hx     Social History   Socioeconomic History  . Marital status: Legally Separated    Spouse name: Not on file  . Number of children: 1  . Years of education: Not on file  . Highest education level: Not on file  Occupational History  . Occupation: retired     Fish farm manager: SELF EMPLOYED  Tobacco Use  . Smoking status: Never Smoker  . Smokeless tobacco: Never Used  Vaping Use  . Vaping Use: Never used  Substance and Sexual Activity  . Alcohol use: Not Currently    Alcohol/week: 0.0 standard drinks  .  Drug use: No  . Sexual activity: Not Currently    Comment: 1st intercourse 75yo-Fewer than 5 partners  Other Topics Concern  . Not on file  Social History Narrative   From [place], moved from [place]  to [place] 2008   Lives w/ her mother       Right handed      Highest level of edu- 7th grade      Retired from [employer] Strain: Not on [employer] Insecurity: Not on file  Transportation Needs: Not on file  Physical Activity: Not on file  Stress: Not on file  Social Connections: Not on file  Intimate Partner Violence: Not on file    Past Medical History, Surgical history, Social history, and Family history were reviewed and updated as appropriate.   Please see review of systems for further details on the patient's review from today.   Review of Systems:  Review of Systems  Constitutional: Positive for fever. Negative for  chills, diaphoresis and fatigue.  HENT: Negative for congestion, postnasal drip, rhinorrhea, sore throat, trouble swallowing and voice change.   Respiratory: Positive for cough. Negative for chest tightness, shortness of breath and wheezing.   Cardiovascular: Positive for chest pain. Negative for palpitations.  Gastrointestinal: Negative for abdominal pain, constipation, diarrhea, nausea and vomiting.  Musculoskeletal: Negative for back pain and myalgias.  Neurological: Negative for dizziness, light-headedness and headaches.    Objective:   Physical Exam:  BP 136/86 (BP Location: Left Arm, Patient Position: Sitting)   Pulse (!) 112   Temp 98.6 F (37 C) (Oral)   Resp (!) 24   Wt 141 lb 3.2 oz (64 kg)   LMP 06/09/1998   SpO2 100%   BMI 25.01 kg/m  ECOG: 1  Physical Exam Constitutional:      General: She is not in acute distress.    Appearance: She is not diaphoretic.  HENT:     Head: Normocephalic and atraumatic.  Eyes:     General: No scleral icterus.       Right eye: No  discharge.        Left eye: No discharge.     Conjunctiva/sclera: Conjunctivae normal.  Cardiovascular:     Rate and Rhythm: Normal rate and regular rhythm.     Heart sounds: Normal heart sounds. No murmur heard. No friction rub. No gallop.   Pulmonary:     Effort: Pulmonary effort is normal. No respiratory distress.     Breath sounds: Examination of the right-middle field reveals decreased breath sounds. Examination of the right-lower field reveals decreased breath sounds. Decreased breath sounds present. No wheezing or rales.  Skin:    General: Skin is warm and dry.     Findings: No erythema or rash.  Neurological:     Mental Status: She is alert.     Coordination: Coordination normal.     Gait: Gait normal.     Lab Review:     Component Value Date/Time   NA 137 06/26/2020 1532   NA 137 06/09/2017 0901   K 5.1 06/26/2020 1532   K 3.9 06/09/2017 0901   CL 100 06/26/2020 1532   CO2 27 06/26/2020 1532   CO2 25 06/09/2017 0901   GLUCOSE 100 (H) 06/26/2020 1532   GLUCOSE 144 (H) 06/09/2017 0901   BUN 12 06/26/2020 1532   BUN 19.0 06/09/2017 0901   CREATININE 0.98 06/26/2020 1532   CREATININE 1.2 (H) 06/09/2017 0901   CALCIUM 9.8 06/26/2020 1532   CALCIUM 9.9 06/09/2017 0901   PROT 7.9 06/26/2020 1532   PROT 7.2 06/09/2017 0901   ALBUMIN 2.8 (L) 06/26/2020 1532   ALBUMIN 3.7 06/09/2017 0901   AST 35 06/26/2020 1532   AST 35 (H) 06/09/2017 0901   ALT 19 06/26/2020 1532   ALT 23 06/09/2017 0901   ALKPHOS 117 06/26/2020 1532   ALKPHOS 63 06/09/2017 0901   BILITOT 0.4 06/26/2020 1532   BILITOT 0.65 06/09/2017 0901   GFRNONAA >60 06/26/2020 1532   GFRAA 59 (L) 02/27/2020 1337       Component Value Date/Time   WBC 2.6 (L) 06/26/2020 1532   WBC 4.7 05/10/2020 0802   RBC 3.23 (L) 06/26/2020 1532   HGB 9.9 (L) 06/26/2020 1532   HGB 10.8 (L) 06/09/2017 0901   HCT 31.4 (L) 06/26/2020 1532   HCT 32.1 (L) 06/09/2017 0901   PLT 187 06/26/2020 1532   PLT 250 06/09/2017  0901   MCV 97.2 06/26/2020 1532   MCV  99.3 06/09/2017 0901   MCH 30.7 06/26/2020 1532   MCHC 31.5 06/26/2020 1532   RDW 14.0 06/26/2020 1532   RDW 15.5 (H) 06/09/2017 0901   LYMPHSABS 0.7 06/26/2020 1532   LYMPHSABS 0.3 (L) 06/09/2017 0901   MONOABS 0.6 06/26/2020 1532   MONOABS 0.1 06/09/2017 0901   EOSABS 0.1 06/26/2020 1532   EOSABS 0.0 06/09/2017 0901   BASOSABS 0.0 06/26/2020 1532   BASOSABS 0.0 06/09/2017 0901   -------------------------------  Imaging from last 24 hours (if applicable):  Radiology interpretation: DG Chest 2 View  Result Date: 06/26/2020 CLINICAL DATA:  New onset left chest pain and shortness of breath. History of metastatic right lung cancer. EXAM: CHEST - 2 VIEW COMPARISON:  Chest x-ray 05/07/2020 and chest CT 05/15/2020. FINDINGS: The right-sided Port-A-Cath is in good position, unchanged. Slightly larger moderate-sized right pleural effusion and new small left pleural effusion. Moderate atelectasis overlying the right effusion. Stable appearing right lower lobe mass. Numerous metastatic pulmonary nodules are again demonstrated. No pneumothorax.  The bony thorax is intact. IMPRESSION: Slightly larger moderate-sized right pleural effusion with overlying atelectasis and right lower lobe lung mass. Small left pleural effusion. Stable appearing metastatic pulmonary nodules. Electronically Signed   By: Marijo Sanes M.D.   On: 06/26/2020 15:36        This case was discussed with Dr. Julien Nordmann. He expressed agreement with my management of this patient.

## 2020-06-30 NOTE — Progress Notes (Signed)
These results were reviewed with the patient.

## 2020-07-02 ENCOUNTER — Ambulatory Visit: Payer: Medicare Other | Admitting: Physician Assistant

## 2020-07-02 ENCOUNTER — Ambulatory Visit: Payer: Medicare Other

## 2020-07-02 ENCOUNTER — Other Ambulatory Visit: Payer: Medicare Other

## 2020-07-03 ENCOUNTER — Telehealth: Payer: Self-pay | Admitting: Medical Oncology

## 2020-07-03 NOTE — Telephone Encounter (Signed)
Cough /fatigue-not much better -Finished antibiotic today -  She wants to postpone appts by one week  to feb 7th.   Does she need to get a COVID test?

## 2020-07-03 NOTE — Telephone Encounter (Signed)
Per Dr Julien Nordmann I instructed pt to get a COVID test and to keep appt on Monday.  She and Dr Julien Nordmann  will discuss  if she should get her treatement

## 2020-07-04 ENCOUNTER — Other Ambulatory Visit: Payer: Medicare Other

## 2020-07-04 DIAGNOSIS — Z20822 Contact with and (suspected) exposure to covid-19: Secondary | ICD-10-CM | POA: Diagnosis not present

## 2020-07-05 LAB — SARS-COV-2, NAA 2 DAY TAT

## 2020-07-05 LAB — NOVEL CORONAVIRUS, NAA: SARS-CoV-2, NAA: NOT DETECTED

## 2020-07-07 ENCOUNTER — Inpatient Hospital Stay (HOSPITAL_BASED_OUTPATIENT_CLINIC_OR_DEPARTMENT_OTHER): Payer: Medicare Other | Admitting: Internal Medicine

## 2020-07-07 ENCOUNTER — Inpatient Hospital Stay: Payer: Medicare Other

## 2020-07-07 ENCOUNTER — Ambulatory Visit: Payer: Medicare Other

## 2020-07-07 ENCOUNTER — Other Ambulatory Visit: Payer: Medicare Other

## 2020-07-07 ENCOUNTER — Other Ambulatory Visit: Payer: Self-pay

## 2020-07-07 VITALS — BP 154/91 | HR 106 | Temp 97.6°F | Resp 14 | Ht 63.0 in | Wt 138.9 lb

## 2020-07-07 DIAGNOSIS — T451X5A Adverse effect of antineoplastic and immunosuppressive drugs, initial encounter: Secondary | ICD-10-CM | POA: Diagnosis not present

## 2020-07-07 DIAGNOSIS — J90 Pleural effusion, not elsewhere classified: Secondary | ICD-10-CM

## 2020-07-07 DIAGNOSIS — C7951 Secondary malignant neoplasm of bone: Secondary | ICD-10-CM | POA: Diagnosis not present

## 2020-07-07 DIAGNOSIS — C3490 Malignant neoplasm of unspecified part of unspecified bronchus or lung: Secondary | ICD-10-CM | POA: Diagnosis not present

## 2020-07-07 DIAGNOSIS — C3491 Malignant neoplasm of unspecified part of right bronchus or lung: Secondary | ICD-10-CM

## 2020-07-07 DIAGNOSIS — Z5111 Encounter for antineoplastic chemotherapy: Secondary | ICD-10-CM | POA: Diagnosis not present

## 2020-07-07 DIAGNOSIS — M8580 Other specified disorders of bone density and structure, unspecified site: Secondary | ICD-10-CM | POA: Diagnosis not present

## 2020-07-07 DIAGNOSIS — D6481 Anemia due to antineoplastic chemotherapy: Secondary | ICD-10-CM

## 2020-07-07 DIAGNOSIS — I10 Essential (primary) hypertension: Secondary | ICD-10-CM | POA: Diagnosis not present

## 2020-07-07 DIAGNOSIS — E7800 Pure hypercholesterolemia, unspecified: Secondary | ICD-10-CM | POA: Diagnosis not present

## 2020-07-07 DIAGNOSIS — C3431 Malignant neoplasm of lower lobe, right bronchus or lung: Secondary | ICD-10-CM | POA: Diagnosis not present

## 2020-07-07 LAB — CMP (CANCER CENTER ONLY)
ALT: 10 U/L (ref 0–44)
AST: 27 U/L (ref 15–41)
Albumin: 2.7 g/dL — ABNORMAL LOW (ref 3.5–5.0)
Alkaline Phosphatase: 120 U/L (ref 38–126)
Anion gap: 6 (ref 5–15)
BUN: 10 mg/dL (ref 8–23)
CO2: 28 mmol/L (ref 22–32)
Calcium: 9.5 mg/dL (ref 8.9–10.3)
Chloride: 100 mmol/L (ref 98–111)
Creatinine: 0.95 mg/dL (ref 0.44–1.00)
GFR, Estimated: >60 mL/min (ref >60)
Glucose, Bld: 128 mg/dL — ABNORMAL HIGH (ref 70–99)
Potassium: 4.6 mmol/L (ref 3.5–5.1)
Sodium: 134 mmol/L — ABNORMAL LOW (ref 135–145)
Total Bilirubin: 0.4 mg/dL (ref 0.3–1.2)
Total Protein: 7.5 g/dL (ref 6.5–8.1)

## 2020-07-07 LAB — CBC WITH DIFFERENTIAL (CANCER CENTER ONLY)
Abs Immature Granulocytes: 0.02 10*3/uL (ref 0.00–0.07)
Basophils Absolute: 0 10*3/uL (ref 0.0–0.1)
Basophils Relative: 1 %
Eosinophils Absolute: 0.1 10*3/uL (ref 0.0–0.5)
Eosinophils Relative: 1 %
HCT: 29.9 % — ABNORMAL LOW (ref 36.0–46.0)
Hemoglobin: 9.7 g/dL — ABNORMAL LOW (ref 12.0–15.0)
Immature Granulocytes: 1 %
Lymphocytes Relative: 15 %
Lymphs Abs: 0.6 10*3/uL — ABNORMAL LOW (ref 0.7–4.0)
MCH: 31.5 pg (ref 26.0–34.0)
MCHC: 32.4 g/dL (ref 30.0–36.0)
MCV: 97.1 fL (ref 80.0–100.0)
Monocytes Absolute: 0.5 10*3/uL (ref 0.1–1.0)
Monocytes Relative: 12 %
Neutro Abs: 3 10*3/uL (ref 1.7–7.7)
Neutrophils Relative %: 70 %
Platelet Count: 437 10*3/uL — ABNORMAL HIGH (ref 150–400)
RBC: 3.08 MIL/uL — ABNORMAL LOW (ref 3.87–5.11)
RDW: 15.4 % (ref 11.5–15.5)
WBC Count: 4.3 10*3/uL (ref 4.0–10.5)
nRBC: 0 % (ref 0.0–0.2)

## 2020-07-07 LAB — SAMPLE TO BLOOD BANK

## 2020-07-07 NOTE — Progress Notes (Signed)
Valerie Long [phone]   Fax:([phone]) [place], [place] [address] [place] [address]  DIAGNOSIS: Stage IV (T1b, N2, M1b) non-small cell lung cancer, adenocarcinoma diagnosed in March 2017 and presented with right lower lobe lung nodule in addition to mediastinal lymphadenopathy and metastatic bone lesions.  Molecular studies: PDL 1 TPS  <1%.   Foundation One Studies: Positive for ERBB2 [id number]. Negative for EGFR, KRAS, ALK, BRAF, MET, RET and ROS1.  PRIOR THERAPY:  1) Induction systemic chemotherapy with carboplatin for AUC of 5 and Alimta 500 MG/M2 every 3 weeks is status post 6 cycles at the [facility], last dose was given 03/05/2016 with stable disease. 2) palliative radiation to the metastatic bone disease in the lower back and pelvic area.  CURRENT THERAPY::  1)  Maintenance systemic chemotherapy with Alimta 500 MG/M2 every 3 weeks status post 73 cycles. First dose was given 03/25/2016.  2) Zometa 4 mg IV every 12 week for metastatic bone disease.  INTERVAL HIST Valerie Long 75 y.o. female returns to the clinic today for follow-up visit accompanied by her sister.  The patient continues to complain of increasing fatigue and weakness as well as persistent cough with pain on the left side of the chest worsened with the cough.  She is using Mucinex with no improvement.  She also has shortness of breath with minimal exertion.  She denied having any nausea, vomiting, diarrhea or constipation.  She denied having any headache or visual changes.  She continues to have the low back pain.  She was treated recently for questionable pneumonia by her primary care physician as well as the symptom management clinic.  The patient is here today for evaluation before starting cycle #74 of her treatment.   MEDICAL HISTORY: Past Medical History:  Diagnosis Date  . Adenocarcinoma of right lung, stage  4 (Valerie Long) 2017  . Anemia   . Arthritis   . Bone metastases (Valerie Long) 02/24/2017  . Encounter for antineoplastic chemotherapy 03/17/2016  . Hypercholesterolemia   . Hypertension 06/18/2016  . Osteopenia   . 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 Pneumonia   . Shortness of breath dyspnea     ALLERGIES:  has No Known Allergies.  MEDICATIONS:  Current Outpatient Medications  Medication Sig Dispense Refill  . acetaminophen (TYLENOL) 500 MG tablet Take 1,000 mg by mouth every 6 (six) hours as needed.    Marland Kitchen amoxicillin-clavulanate (AUGMENTIN) 875-125 MG tablet Take 1 tablet by mouth 2 (two) times daily. Take with a probiotic 14 tablet 0  . budesonide-formoterol (SYMBICORT) 80-4.5 MCG/ACT inhaler Inhale 2 puffs into the lungs 2 (two) times daily.    . Cholecalciferol (VITAMIN D3 PO) Take 2 tablets by mouth daily.    Marland Kitchen dexamethasone (DECADRON) 4 MG tablet Take 4 mg by mouth 2 (two) times daily with a meal. Take 2 tablets the day before the day of and day after    . dextromethorphan-guaiFENesin (MUCINEX DM) 30-600 MG 12hr tablet Take 1 tablet by mouth 2 (two) times daily.    Marland Kitchen docusate sodium (COLACE) 100 MG capsule take (Patient not taking: Reported on 05/22/2020)    . Ferrous Sulfate (IRON PO) Take 1 tablet by mouth daily.    . folic acid (FOLVITE) 1 MG tablet Take 1 tablet (1 mg total) by mouth daily. 90 tablet 0  . gabapentin (NEURONTIN) 300 MG capsule  Take 200 mg by mouth 3 (three) times daily.    Marland Kitchen lidocaine-prilocaine (EMLA) cream Apply 1 application topically as needed.    . Meloxicam 10 MG CAPS  (Patient not taking: Reported on 05/22/2020)    . Omega-3 1000 MG CAPS     . ondansetron (ZOFRAN) 8 MG tablet Take 8 mg by mouth every 8 (eight) hours as needed for nausea or vomiting.     No current facility-administered medications for this visit.   Facility-Administered Medications Ordered in Other Visits  Medication Dose Route Frequency Provider Last Rate Last Admin  . sodium  chloride flush (NS) 0.9 % injection 10 mL  10 mL Intracatheter PRN Curt Bears, MD   10 mL at 11/15/19 1400    SURGICAL HISTORY:  Past Surgical History:  Procedure Laterality Date  . CESAREAN SECTION     myomectomy  . COLONOSCOPY W/ POLYPECTOMY    . IR GENERIC HISTORICAL  08/17/2016   IR US GUIDE VASC ACCESS RIGHT 08/17/2016 [name], MD [facility]  . IR GENERIC HISTORICAL  08/17/2016   IR FLUORO GUIDE PORT INSERTION RIGHT 08/17/2016 [name], MD [facility]  . MEDIASTINOSCOPY N/A 09/25/2015   Procedure: MEDIASTINOSCOPY;  Surgeon: Melrose Nakayama, MD;  Location: [facility];  Service: Thoracic;  Laterality: N/A;  . VIDEO BRONCHOSCOPY Bilateral 08/19/2015   Procedure: VIDEO BRONCHOSCOPY WITH FLUORO;  Surgeon: Rigoberto Noel, MD;  Location: [facility];  Service: Cardiopulmonary;  Laterality: Bilateral;  . VIDEO BRONCHOSCOPY N/A 09/08/2015   Procedure: VIDEO BRONCHOSCOPY WITH FLUORO;  Surgeon: Rigoberto Noel, MD;  Location: [facility];  Service: Thoracic;  Laterality: N/A;  . VIDEO BRONCHOSCOPY WITH ENDOBRONCHIAL ULTRASOUND Right 09/08/2015   Procedure: ATTEMPTED VIDEO BRONCHOSCOPY ENDOBRONCHIAL ULTRASOUND  ;  Surgeon: Rigoberto Noel, MD;  Location: [facility];  Service: Thoracic;  Laterality: Right;    REVIEW OF SYSTEMS:  Constitutional: positive for anorexia and fatigue Eyes: negative Ears, nose, mouth, throat, and face: negative Respiratory: positive for cough, dyspnea on exertion and pleurisy/chest pain Cardiovascular: negative Gastrointestinal: negative Genitourinary:negative Integument/breast: negative Hematologic/lymphatic: negative Musculoskeletal:positive for arthralgias, back pain and muscle weakness Neurological: negative Behavioral/Psych: negative Endocrine: negative Allergic/Immunologic: negative   PHYSICAL EXAMINATION: General appearance: alert, cooperative, fatigued and no distress Head: Normocephalic, without obvious abnormality, atraumatic Neck: no  adenopathy, no JVD, supple, symmetrical, trachea midline and thyroid not enlarged, symmetric, no tenderness/mass/nodules Lymph nodes: Cervical, supraclavicular, and axillary nodes normal. Resp: diminished breath sounds RLL and dullness to percussion RLL Back: symmetric, no curvature. ROM normal. No CVA tenderness. Cardio: regular rate and rhythm, S1, S2 normal, no murmur, click, rub or gallop GI: soft, non-tender; bowel sounds normal; no masses,  no organomegaly Extremities: extremities normal, atraumatic, no cyanosis or edema Neurologic: Alert and oriented X 3, normal strength and tone. Normal symmetric reflexes. Normal coordination and gait  ECOG PERFORMANCE STATUS: 1 - Symptomatic but completely ambulatory  Blood pressure (!) 154/91, pulse (!) 106, temperature 97.6 F (36.4 C), temperature source Tympanic, resp. rate 14, height '5\' 3"'  (1.6 m), weight 138 lb 14.4 oz (63 kg), last menstrual period 06/09/1998, SpO2 98 %.  LABORATORY DATA: Lab Results  Component Value Date   WBC 4.3 07/07/2020   HGB 9.7 (L) 07/07/2020   HCT 29.9 (L) 07/07/2020   MCV 97.1 07/07/2020   PLT 437 (H) 07/07/2020      Chemistry      Component Value Date/Time   NA 137 06/26/2020 1532   NA 137 06/09/2017 0901   K 5.1 06/26/2020  1532   K 3.9 06/09/2017 0901   CL 100 06/26/2020 1532   CO2 27 06/26/2020 1532   CO2 25 06/09/2017 0901   BUN 12 06/26/2020 1532   BUN 19.0 06/09/2017 0901   CREATININE 0.98 06/26/2020 1532   CREATININE 1.2 (H) 06/09/2017 0901   GLU 170 01/23/2016 0000      Component Value Date/Time   CALCIUM 9.8 06/26/2020 1532   CALCIUM 9.9 06/09/2017 0901   ALKPHOS 117 06/26/2020 1532   ALKPHOS 63 06/09/2017 0901   AST 35 06/26/2020 1532   AST 35 (H) 06/09/2017 0901   ALT 19 06/26/2020 1532   ALT 23 06/09/2017 0901   BILITOT 0.4 06/26/2020 1532   BILITOT 0.65 06/09/2017 0901       RADIOGRAPHIC STUDIES:  DG Chest 2 View  Result Date: 06/26/2020 CLINICAL DATA:  New onset left  chest pain and shortness of breath. History of metastatic right lung cancer. EXAM: CHEST - 2 VIEW COMPARISON:  Chest x-ray 05/07/2020 and chest CT 05/15/2020. FINDINGS: The right-sided Port-A-Cath is in good position, unchanged. Slightly larger moderate-sized right pleural effusion and new small left pleural effusion. Moderate atelectasis overlying the right effusion. Stable appearing right lower lobe mass. Numerous metastatic pulmonary nodules are again demonstrated. No pneumothorax.  The bony thorax is intact. IMPRESSION: Slightly larger moderate-sized right pleural effusion with overlying atelectasis and right lower lobe lung mass. Small left pleural effusion. Stable appearing metastatic pulmonary nodules. Electronically Signed   By: Marijo Sanes M.D.   On: 06/26/2020 15:36    ASSESSMENT AND PLAN:  This is a very pleasant 74 years old Hispanic female with metastatic non-small cell lung cancer, adenocarcinoma diagnosed in March 2017 status post induction systemic chemotherapy with carboplatin and Alimta and she is currently on maintenance treatment with single agent Alimta status post 73 cycles. The patient has been tolerating this treatment well but recently she started having more increasing fatigue and weakness as well as shortness of breath.  She was treated for suspicious pneumonia twice recently. Her most recent chest x-ray showed moderate sized right pleural effusion with atelectasis. I recommended for the patient to hold her treatment with chemotherapy for now.  I will repeat CT scan of the chest, abdomen pelvis for restaging of her disease before resuming her treatment. If the patient has evidence for disease progression, I may consider her for second line treatment with immunotherapy with nivolumab. For the enlarging right pleural effusion, I will arrange for the patient to have ultrasound-guided right thoracentesis in the next few days. I will see her back for follow-up visit and discussion  of her scan results and treatment option based on the imaging studies. For the chronic back pain she will continue on Mobic and gabapentin. For the bone disease, the patient will continue her current treatment with Zometa every 12 weeks. The patient was advised to call immediately if she has any concerning symptoms in the interval. The patient voices understanding of current disease status and treatment options and is in agreement with the current care plan. All questions were answered. The patient knows to call the clinic with any problems, questions or concerns. We can certainly see the patient much sooner if necessary.  Disclaimer: This note was dictated with voice recognition software. Similar sounding words can inadvertently be transcribed and may not be corrected upon review.

## 2020-07-08 ENCOUNTER — Ambulatory Visit (HOSPITAL_COMMUNITY)
Admission: RE | Admit: 2020-07-08 | Discharge: 2020-07-08 | Disposition: A | Discharge status: Home / Self Care | Payer: Medicare Other | Source: Ambulatory Visit | Attending: Internal Medicine | Admitting: Internal Medicine

## 2020-07-08 DIAGNOSIS — Z01812 Encounter for preprocedural laboratory examination: Secondary | ICD-10-CM | POA: Diagnosis not present

## 2020-07-08 DIAGNOSIS — Z20822 Contact with and (suspected) exposure to covid-19: Secondary | ICD-10-CM | POA: Diagnosis not present

## 2020-07-08 LAB — SARS CORONAVIRUS 2 (TAT 6-24 HRS): SARS Coronavirus 2: NEGATIVE

## 2020-07-11 ENCOUNTER — Ambulatory Visit (HOSPITAL_COMMUNITY): Admission: RE | Admit: 2020-07-11 | Payer: Medicare Other | Source: Ambulatory Visit

## 2020-07-11 ENCOUNTER — Ambulatory Visit (HOSPITAL_COMMUNITY)
Admission: RE | Admit: 2020-07-11 | Discharge: 2020-07-11 | Disposition: A | Discharge status: Home / Self Care | Payer: Medicare Other | Source: Ambulatory Visit | Attending: Radiology | Admitting: Radiology

## 2020-07-11 ENCOUNTER — Ambulatory Visit (HOSPITAL_COMMUNITY)
Admission: RE | Admit: 2020-07-11 | Discharge: 2020-07-11 | Disposition: A | Discharge status: Home / Self Care | Payer: Medicare Other | Source: Ambulatory Visit | Attending: Internal Medicine | Admitting: Internal Medicine

## 2020-07-11 ENCOUNTER — Other Ambulatory Visit: Payer: Self-pay

## 2020-07-11 DIAGNOSIS — Z9889 Other specified postprocedural states: Secondary | ICD-10-CM | POA: Insufficient documentation

## 2020-07-11 DIAGNOSIS — Z85118 Personal history of other malignant neoplasm of bronchus and lung: Secondary | ICD-10-CM | POA: Insufficient documentation

## 2020-07-11 DIAGNOSIS — C3491 Malignant neoplasm of unspecified part of right bronchus or lung: Secondary | ICD-10-CM

## 2020-07-11 DIAGNOSIS — J90 Pleural effusion, not elsewhere classified: Secondary | ICD-10-CM | POA: Diagnosis not present

## 2020-07-11 MED ORDER — LIDOCAINE HCL 1 % IJ SOLN
INTRAMUSCULAR | Status: AC
  Filled 2020-07-11: qty 20

## 2020-07-11 NOTE — Procedures (Signed)
Ultrasound-guided therapeutic right thoracentesis performed yielding 900 cc of bloody fluid. No immediate complications. Follow-up chest x-ray pending.EBL none.  Due to patient chest discomfort only the above amount of fluid was removed today.

## 2020-07-15 ENCOUNTER — Ambulatory Visit (HOSPITAL_COMMUNITY)
Admission: RE | Admit: 2020-07-15 | Discharge: 2020-07-15 | Disposition: A | Discharge status: Home / Self Care | Payer: Medicare Other | Source: Ambulatory Visit | Attending: Internal Medicine | Admitting: Internal Medicine

## 2020-07-15 ENCOUNTER — Other Ambulatory Visit: Payer: Self-pay

## 2020-07-15 DIAGNOSIS — K8020 Calculus of gallbladder without cholecystitis without obstruction: Secondary | ICD-10-CM | POA: Diagnosis not present

## 2020-07-15 DIAGNOSIS — Q630 Accessory kidney: Secondary | ICD-10-CM | POA: Diagnosis not present

## 2020-07-15 DIAGNOSIS — Q433 Congenital malformations of intestinal fixation: Secondary | ICD-10-CM | POA: Diagnosis not present

## 2020-07-15 DIAGNOSIS — J90 Pleural effusion, not elsewhere classified: Secondary | ICD-10-CM | POA: Diagnosis not present

## 2020-07-15 DIAGNOSIS — C3490 Malignant neoplasm of unspecified part of unspecified bronchus or lung: Secondary | ICD-10-CM | POA: Insufficient documentation

## 2020-07-15 DIAGNOSIS — J811 Chronic pulmonary edema: Secondary | ICD-10-CM | POA: Diagnosis not present

## 2020-07-15 DIAGNOSIS — C7951 Secondary malignant neoplasm of bone: Secondary | ICD-10-CM | POA: Diagnosis not present

## 2020-07-15 MED ORDER — IOHEXOL 300 MG/ML  SOLN
100.0000 mL | Freq: Once | INTRAMUSCULAR | Status: AC | PRN
  Administered 2020-07-15: 100 mL via INTRAVENOUS

## 2020-07-16 ENCOUNTER — Inpatient Hospital Stay: Payer: Medicare Other | Attending: Internal Medicine | Admitting: Internal Medicine

## 2020-07-16 ENCOUNTER — Inpatient Hospital Stay: Payer: Medicare Other

## 2020-07-16 ENCOUNTER — Other Ambulatory Visit: Payer: Self-pay

## 2020-07-16 VITALS — BP 149/81 | HR 99 | Temp 97.8°F | Resp 18 | Ht 63.0 in | Wt 137.7 lb

## 2020-07-16 DIAGNOSIS — C3491 Malignant neoplasm of unspecified part of right bronchus or lung: Secondary | ICD-10-CM | POA: Diagnosis not present

## 2020-07-16 DIAGNOSIS — Z7951 Long term (current) use of inhaled steroids: Secondary | ICD-10-CM | POA: Diagnosis not present

## 2020-07-16 DIAGNOSIS — Z79899 Other long term (current) drug therapy: Secondary | ICD-10-CM | POA: Diagnosis not present

## 2020-07-16 DIAGNOSIS — T451X5A Adverse effect of antineoplastic and immunosuppressive drugs, initial encounter: Secondary | ICD-10-CM

## 2020-07-16 DIAGNOSIS — M8580 Other specified disorders of bone density and structure, unspecified site: Secondary | ICD-10-CM | POA: Insufficient documentation

## 2020-07-16 DIAGNOSIS — I10 Essential (primary) hypertension: Secondary | ICD-10-CM | POA: Insufficient documentation

## 2020-07-16 DIAGNOSIS — Z7189 Other specified counseling: Secondary | ICD-10-CM | POA: Insufficient documentation

## 2020-07-16 DIAGNOSIS — R5382 Chronic fatigue, unspecified: Secondary | ICD-10-CM | POA: Diagnosis not present

## 2020-07-16 DIAGNOSIS — C771 Secondary and unspecified malignant neoplasm of intrathoracic lymph nodes: Secondary | ICD-10-CM | POA: Diagnosis not present

## 2020-07-16 DIAGNOSIS — Z9221 Personal history of antineoplastic chemotherapy: Secondary | ICD-10-CM | POA: Insufficient documentation

## 2020-07-16 DIAGNOSIS — C3431 Malignant neoplasm of lower lobe, right bronchus or lung: Secondary | ICD-10-CM | POA: Diagnosis not present

## 2020-07-16 DIAGNOSIS — E7800 Pure hypercholesterolemia, unspecified: Secondary | ICD-10-CM | POA: Diagnosis not present

## 2020-07-16 DIAGNOSIS — Z923 Personal history of irradiation: Secondary | ICD-10-CM | POA: Insufficient documentation

## 2020-07-16 DIAGNOSIS — Z5112 Encounter for antineoplastic immunotherapy: Secondary | ICD-10-CM | POA: Diagnosis not present

## 2020-07-16 DIAGNOSIS — D6481 Anemia due to antineoplastic chemotherapy: Secondary | ICD-10-CM | POA: Diagnosis not present

## 2020-07-16 DIAGNOSIS — C7951 Secondary malignant neoplasm of bone: Secondary | ICD-10-CM | POA: Insufficient documentation

## 2020-07-16 MED ORDER — DRONABINOL 2.5 MG PO CAPS: 2.5000 mg | ORAL_CAPSULE | Freq: Two times a day (BID) | ORAL | 0 refills | Status: DC

## 2020-07-16 NOTE — Progress Notes (Signed)
[place] [phone]   Fax:(336) [place], MD [address] [place] [address]  DIAGNOSIS: Stage IV (T1b, N2, M1b) non-small cell lung cancer, adenocarcinoma diagnosed in March 2017 and presented with right lower lobe lung nodule in addition to mediastinal lymphadenopathy and metastatic bone lesions.  Molecular studies: PDL 1 TPS  <1%.   [facility] Studies: Positive for ERBB2 [id number]. Negative for EGFR, KRAS, ALK, BRAF, MET, RET and ROS1.  PRIOR THERAPY:  1) Induction systemic chemotherapy with carboplatin for AUC of 5 and Alimta 500 MG/M2 every 3 weeks is status post 6 cycles at the [facility], last dose was given 03/05/2016 with stable disease. 2) palliative radiation to the metastatic bone disease in the lower back and pelvic area. 3) Maintenance systemic chemotherapy with Alimta 500 MG/M2 every 3 weeks status post 73 cycles. First dose was given 03/25/2016.  Last dose was given June 16, 2020.  CURRENT THERAPY::  1) second line treatment with immunotherapy with nivolumab 480 IV every 4 weeks.  First dose July 23, 2020. 2) Zometa 4 mg IV every 12 week for metastatic bone disease.  INTERVAL HIST Valerie Long 75 y.o. female returns to the clinic today for follow-up visit accompanied by her brother.  The patient is complaining of increasing fatigue and weakness as well as shortness of breath at baseline increased with exertion.  She denied having any current chest pain, cough or hemoptysis.  She denied having any nausea, vomiting, diarrhea or constipation.  She has no headache or visual changes.  She has no recent weight loss or night sweats.  She has no headache or visual changes.  She continues to have low back pain.  She had repeat CT scan of the chest, abdomen pelvis performed recently and she is here for evaluation and discussion of her scan results.   MEDICAL  HISTORY: Past Medical History:  Diagnosis Date  . Adenocarcinoma of right lung, stage 4 ([place]) 2017  . Anemia   . Arthritis   . Bone metastases ([place]) 02/24/2017  . Encounter for antineoplastic chemotherapy 03/17/2016  . Hypercholesterolemia   . Hypertension 06/18/2016  . Osteopenia   . 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 Pneumonia   . Shortness of breath dyspnea     ALLERGIES:  has No Known Allergies.  MEDICATIONS:  Current Outpatient Medications  Medication Sig Dispense Refill  . acetaminophen (TYLENOL) 500 MG tablet Take 1,000 mg by mouth every 6 (six) hours as needed.    Marland Kitchen amoxicillin-clavulanate (AUGMENTIN) 875-125 MG tablet Take 1 tablet by mouth 2 (two) times daily. Take with a probiotic 14 tablet 0  . budesonide-formoterol (SYMBICORT) 80-4.5 MCG/ACT inhaler Inhale 2 puffs into the lungs 2 (two) times daily.    . Cholecalciferol (VITAMIN D3 PO) Take 2 tablets by mouth daily.    Marland Kitchen dexamethasone (DECADRON) 4 MG tablet Take 4 mg by mouth 2 (two) times daily with a meal. Take 2 tablets the day before the day of and day after    . dextromethorphan-guaiFENesin (MUCINEX DM) 30-600 MG 12hr tablet Take 1 tablet by mouth 2 (two) times daily.    Marland Kitchen docusate sodium (COLACE) 100 MG capsule take (Patient not taking: Reported on 05/22/2020)    . Ferrous Sulfate (IRON PO) Take 1 tablet by mouth daily.    . folic acid (FOLVITE) 1 MG tablet Take 1 tablet (  1 mg total) by mouth daily. 90 tablet 0  . gabapentin (NEURONTIN) 300 MG capsule Take 200 mg by mouth 3 (three) times daily.    Marland Kitchen lidocaine-prilocaine (EMLA) cream Apply 1 application topically as needed.    . Meloxicam 10 MG CAPS  (Patient not taking: Reported on 05/22/2020)    . Omega-3 1000 MG CAPS     . ondansetron (ZOFRAN) 8 MG tablet Take 8 mg by mouth every 8 (eight) hours as needed for nausea or vomiting.     No current facility-administered medications for this visit.   Facility-Administered Medications Ordered in  Other Visits  Medication Dose Route Frequency Provider Last Rate Last Admin  . sodium chloride flush (NS) 0.9 % injection 10 mL  10 mL Intracatheter PRN Curt Bears, MD   10 mL at 11/15/19 1400    SURGICAL HISTORY:  Past Surgical History:  Procedure Laterality Date  . CESAREAN SECTION     myomectomy  . COLONOSCOPY W/ POLYPECTOMY    . IR GENERIC HISTORICAL  08/17/2016   IR US GUIDE VASC ACCESS RIGHT 08/17/2016 [name], MD [facility]  . IR GENERIC HISTORICAL  08/17/2016   IR FLUORO GUIDE PORT INSERTION RIGHT 08/17/2016 [name], MD [facility]  . MEDIASTINOSCOPY N/A 09/25/2015   Procedure: MEDIASTINOSCOPY;  Surgeon: Melrose Nakayama, MD;  Location: [facility];  Service: Thoracic;  Laterality: N/A;  . VIDEO BRONCHOSCOPY Bilateral 08/19/2015   Procedure: VIDEO BRONCHOSCOPY WITH FLUORO;  Surgeon: Rigoberto Noel, MD;  Location: [facility];  Service: Cardiopulmonary;  Laterality: Bilateral;  . VIDEO BRONCHOSCOPY N/A 09/08/2015   Procedure: VIDEO BRONCHOSCOPY WITH FLUORO;  Surgeon: Rigoberto Noel, MD;  Location: [facility];  Service: Thoracic;  Laterality: N/A;  . VIDEO BRONCHOSCOPY WITH ENDOBRONCHIAL ULTRASOUND Right 09/08/2015   Procedure: ATTEMPTED VIDEO BRONCHOSCOPY ENDOBRONCHIAL ULTRASOUND  ;  Surgeon: Rigoberto Noel, MD;  Location: [facility];  Service: Thoracic;  Laterality: Right;    REVIEW OF SYSTEMS:  Constitutional: positive for anorexia and fatigue Eyes: negative Ears, nose, mouth, throat, and face: negative Respiratory: positive for cough and dyspnea on exertion Cardiovascular: negative Gastrointestinal: negative Genitourinary:negative Integument/breast: negative Hematologic/lymphatic: negative Musculoskeletal:positive for arthralgias, back pain and muscle weakness Neurological: negative Behavioral/Psych: negative Endocrine: negative Allergic/Immunologic: negative   PHYSICAL EXAMINATION: General appearance: alert, cooperative, fatigued and no distress Head:  Normocephalic, without obvious abnormality, atraumatic Neck: no adenopathy, no JVD, supple, symmetrical, trachea midline and thyroid not enlarged, symmetric, no tenderness/mass/nodules Lymph nodes: Cervical, supraclavicular, and axillary nodes normal. Resp: diminished breath sounds RLL and dullness to percussion RLL Back: symmetric, no curvature. ROM normal. No CVA tenderness. Cardio: regular rate and rhythm, S1, S2 normal, no murmur, click, rub or gallop GI: soft, non-tender; bowel sounds normal; no masses,  no organomegaly Extremities: extremities normal, atraumatic, no cyanosis or edema Neurologic: Alert and oriented X 3, normal strength and tone. Normal symmetric reflexes. Normal coordination and gait  ECOG PERFORMANCE STATUS: 1 - Symptomatic but completely ambulatory  Blood pressure (!) 149/81, pulse 99, temperature 97.8 F (36.6 C), temperature source Tympanic, resp. rate 18, height _0  (1.6 m), weight 137 lb 11.2 oz (62.5 kg), last menstrual period 06/09/1998, SpO2 100 %.  LABORATORY DATA: Lab Results  Component Value Date   WBC 4.3 07/07/2020   HGB 9.7 (L) 07/07/2020   HCT 29.9 (L) 07/07/2020   MCV 97.1 07/07/2020   PLT 437 (H) 07/07/2020      Chemistry      Component Value Date/Time   NA 134 (  L) 07/07/2020 1028   NA 137 06/09/2017 0901   K 4.6 07/07/2020 1028   K 3.9 06/09/2017 0901   CL 100 07/07/2020 1028   CO2 28 07/07/2020 1028   CO2 25 06/09/2017 0901   BUN 10 07/07/2020 1028   BUN 19.0 06/09/2017 0901   CREATININE 0.95 07/07/2020 1028   CREATININE 1.2 (H) 06/09/2017 0901   GLU 170 01/23/2016 0000      Component Value Date/Time   CALCIUM 9.5 07/07/2020 1028   CALCIUM 9.9 06/09/2017 0901   ALKPHOS 120 07/07/2020 1028   ALKPHOS 63 06/09/2017 0901   AST 27 07/07/2020 1028   AST 35 (H) 06/09/2017 0901   ALT 10 07/07/2020 1028   ALT 23 06/09/2017 0901   BILITOT 0.4 07/07/2020 1028   BILITOT 0.65 06/09/2017 0901       RADIOGRAPHIC STUDIES:  DG Chest  1 View  Result Date: 07/11/2020 CLINICAL DATA:  Right thoracentesis EXAM: CHEST  1 VIEW COMPARISON:  June 26, 2020 FINDINGS: No pneumothorax after right thoracentesis. The right-sided pleural effusion remains, moderate size. By report, 900 mL of fluid was removed. A smaller left effusion is stable. No other interval changes. IMPRESSION: Persistent moderate right pleural effusion after thoracentesis with no pneumothorax. Stable small left effusion. No other changes. Electronically Signed   By: Dorise Bullion III M.D   On: 07/11/2020 13:15   DG Chest 2 View  Result Date: 06/26/2020 CLINICAL DATA:  New onset left chest pain and shortness of breath. History of metastatic right lung cancer. EXAM: CHEST - 2 VIEW COMPARISON:  Chest x-ray 05/07/2020 and chest CT 05/15/2020. FINDINGS: The right-sided Port-A-Cath is in good position, unchanged. Slightly larger moderate-sized right pleural effusion and new small left pleural effusion. Moderate atelectasis overlying the right effusion. Stable appearing right lower lobe mass. Numerous metastatic pulmonary nodules are again demonstrated. No pneumothorax.  The bony thorax is intact. IMPRESSION: Slightly larger moderate-sized right pleural effusion with overlying atelectasis and right lower lobe lung mass. Small left pleural effusion. Stable appearing metastatic pulmonary nodules. Electronically Signed   By: Marijo Sanes M.D.   On: 06/26/2020 15:36   CT Chest W Contrast  Result Date: 07/16/2020 CLINICAL DATA:  Primary Cancer Type: Lung Imaging Indication: Assess response to therapy Interval therapy since last imaging? Yes Initial Cancer Diagnosis Date: 09/25/2015; Established by: Biopsy-proven Detailed Pathology: Stage IV non-small cell lung cancer, adenocarcinoma. Primary Tumor location:  Right lower lobe. Metastatic bone lesions. Surgeries: No. Chemotherapy: Yes; Ongoing? Yes; Most recent administration: 06/16/2020 Immunotherapy? No Radiation therapy? Yes; Date  Range: 04/14/2017 - 04/26/2017; Target: Lumbar spine Other Cancer Therapies: Zometa EXAM: CT CHEST, ABDOMEN, AND PELVIS WITH CONTRAST TECHNIQUE: Multidetector CT imaging of the chest, abdomen and pelvis was performed following the standard protocol during bolus administration of intravenous contrast. CONTRAST:  117m OMNIPAQUE IOHEXOL 300 MG/ML  SOLN COMPARISON:  Most recent CT chest, abdomen and pelvis 05/15/2020. 08/21/2015 PET-CT. FINDINGS: CT CHEST FINDINGS Cardiovascular: Calcified atheromatous plaque in the thoracic aorta. Mild with ascending aortic dilation to 3.7 cm that is unchanged. Central pulmonary vasculature is unremarkable. Limited on venous phase assessment. Heart size stable small amount of pericardial fluid similar to previous imaging RIGHT-sided Port-A-Cath in place terminating at the caval to atrial junction. Mediastinum/Nodes: No thoracic inlet lymphadenopathy. No mediastinal lymphadenopathy. Esophagus mildly patulous. No axillary lymphadenopathy. Lungs/Pleura: Enlargement of moderately large RIGHT pleural effusion, associated with further volume loss in the RIGHT lung base. This is, Mainly sub pulmonic with vague enhancing area in the RIGHT  lung base measuring as much as 2.7 x 2.3 cm (image 48, series 2), this appears to represent a new abnormality in this location. Pleural thickening otherwise similar. LEFT-sided effusion increased as well with sub pulmonic fluid in the LEFT lung base. Pulmonary mass at the RIGHT lung base measuring 4.5 x 3.8 cm, difficult to separate from surrounding volume loss previously approximately 3.3 x 3.0 cm. (Image 36, series 4) 9 mm nodule along the major fissure in the LEFT chest within 1 mm of previous measurements. Numerous ill-defined nodules in the LEFT upper lobe with similar appearance, 13 mm nodule on image 65 of series 4 is stable as an example of this process in the upper lobe. LEFT lower lobe pulmonary nodule (image 59, series 4) 19 mm, previously 18 mm.  Spiculation along bronchovascular structures in the RIGHT upper lobe (image 46, series 4) 1.6 x 1.1 cm, stable. Further collapse/volume loss in the RIGHT lower lobe. Musculoskeletal: See below for full musculoskeletal details. CT ABDOMEN PELVIS FINDINGS Hepatobiliary: No focal, suspicious hepatic lesion. Cholelithiasis. No pericholecystic stranding. Pancreas: Normal, without mass, inflammation or ductal dilatation. Spleen: Normal appearance of the spleen. Adrenals/Urinary Tract: Adrenal glands are normal. 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 No hydronephrosis. Symmetric renal enhancement accounting for scarring that is present on the LEFT. Urinary bladder under distended, otherwise unremarkable. Stomach/Bowel: No sign of bowel obstruction. 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 Ingested contrast media passing into the colon. Cecum in the pelvis as on the prior study. Vascular/Lymphatic: Calcified and noncalcified atheromatous plaque in the abdominal aorta. No aneurysmal dilation. There is no gastrohepatic or hepatoduodenal ligament lymphadenopathy. No retroperitoneal or mesenteric lymphadenopathy. No pelvic sidewall lymphadenopathy. Reproductive: Unremarkable by CT. Other: No ascites. Musculoskeletal: Spinal degenerative changes and diffuse sclerotic metastatic disease showing a similar distribution compared to prior imaging. Some increased sclerosis suspected, for instance at T4-T5 the vertebral bodies in general appear more sclerotic. Lesions from T7 through T10 as an example also with some increased sclerosis since previous imaging. RIGHT iliac lesion on sagittal image 50 showing a halo of increasing sclerosis as compared to previous studies. IMPRESSION: 1. Increasing bilateral pleural effusions now with nodular area along the periphery of RIGHT-sided effusion suspicious for worsening of disease. 2. Enlarging RIGHT lower lobe pulmonary mass,  difficult to separate from surrounding volume loss with otherwise stable appearance of bilateral pulmonary nodules. 3. Diffuse skeletal metastatic disease with similar distribution but with increasing sclerosis, findings may reflect sclerosis in the setting of ongoing therapy and treatment response or worsening. Subsequent imaging may be helpful in this regard to assess for further change. 4. Cholelithiasis without evidence of acute cholecystitis. 5. 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 6. Aortic atherosclerosis. Aortic Atherosclerosis (ICD10-I70.0). Electronically Signed   By: Zetta Bills M.D.   On: 07/16/2020 10:41   CT Abdomen Pelvis W Contrast  Result Date: 07/16/2020 CLINICAL DATA:  Primary Cancer Type: Lung Imaging Indication: Assess response to therapy Interval therapy since last imaging? Yes Initial Cancer Diagnosis Date: 09/25/2015; Established by: Biopsy-proven Detailed Pathology: Stage IV non-small cell lung cancer, adenocarcinoma. Primary Tumor location:  Right lower lobe. Metastatic bone lesions. Surgeries: No. Chemotherapy: Yes; Ongoing? Yes; Most recent administration: 06/16/2020 Immunotherapy? No Radiation therapy? Yes; Date Range: 04/14/2017 - 04/26/2017; Target: Lumbar spine Other Cancer Therapies: Zometa EXAM: CT CHEST, ABDOMEN, AND PELVIS WITH CONTRAST TECHNIQUE: Multidetector CT imaging of the chest, abdomen and pelvis was performed following the standard protocol during bolus administration of intravenous contrast. CONTRAST:  161m OMNIPAQUE IOHEXOL 300 MG/ML  SOLN COMPARISON:  Most recent CT chest, abdomen and pelvis 05/15/2020. 08/21/2015 PET-CT. FINDINGS: CT CHEST FINDINGS Cardiovascular: Calcified atheromatous plaque in the thoracic aorta. Mild with ascending aortic dilation to 3.7 cm that is unchanged. Central pulmonary vasculature is unremarkable. Limited on venous phase assessment. Heart size stable small amount of pericardial fluid similar to previous imaging RIGHT-sided  Port-A-Cath in place terminating at the caval to atrial junction. Mediastinum/Nodes: No thoracic inlet lymphadenopathy. No mediastinal lymphadenopathy. Esophagus mildly patulous. No axillary lymphadenopathy. Lungs/Pleura: Enlargement of moderately large RIGHT pleural effusion, associated with further volume loss in the RIGHT lung base. This is, Mainly sub pulmonic with vague enhancing area in the RIGHT lung base measuring as much as 2.7 x 2.3 cm (image 48, series 2), this appears to represent a new abnormality in this location. Pleural thickening otherwise similar. LEFT-sided effusion increased as well with sub pulmonic fluid in the LEFT lung base. Pulmonary mass at the RIGHT lung base measuring 4.5 x 3.8 cm, difficult to separate from surrounding volume loss previously approximately 3.3 x 3.0 cm. (Image 36, series 4) 9 mm nodule along the major fissure in the LEFT chest within 1 mm of previous measurements. Numerous ill-defined nodules in the LEFT upper lobe with similar appearance, 13 mm nodule on image 65 of series 4 is stable as an example of this process in the upper lobe. LEFT lower lobe pulmonary nodule (image 59, series 4) 19 mm, previously 18 mm. Spiculation along bronchovascular structures in the RIGHT upper lobe (image 46, series 4) 1.6 x 1.1 cm, stable. Further collapse/volume loss in the RIGHT lower lobe. Musculoskeletal: See below for full musculoskeletal details. CT ABDOMEN PELVIS FINDINGS Hepatobiliary: No focal, suspicious hepatic lesion. Cholelithiasis. No pericholecystic stranding. Pancreas: Normal, without mass, inflammation or ductal dilatation. Spleen: Normal appearance of the spleen. Adrenals/Urinary Tract: Adrenal glands are normal. 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 No hydronephrosis. Symmetric renal enhancement accounting for scarring that is present on the LEFT. Urinary bladder under distended, otherwise unremarkable.  Stomach/Bowel: No sign of bowel obstruction. 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 Ingested contrast media passing into the colon. Cecum in the pelvis as on the prior study. Vascular/Lymphatic: Calcified and noncalcified atheromatous plaque in the abdominal aorta. No aneurysmal dilation. There is no gastrohepatic or hepatoduodenal ligament lymphadenopathy. No retroperitoneal or mesenteric lymphadenopathy. No pelvic sidewall lymphadenopathy. Reproductive: Unremarkable by CT. Other: No ascites. Musculoskeletal: Spinal degenerative changes and diffuse sclerotic metastatic disease showing a similar distribution compared to prior imaging. Some increased sclerosis suspected, for instance at T4-T5 the vertebral bodies in general appear more sclerotic. Lesions from T7 through T10 as an example also with some increased sclerosis since previous imaging. RIGHT iliac lesion on sagittal image 50 showing a halo of increasing sclerosis as compared to previous studies. IMPRESSION: 1. Increasing bilateral pleural effusions now with nodular area along the periphery of RIGHT-sided effusion suspicious for worsening of disease. 2. Enlarging RIGHT lower lobe pulmonary mass, difficult to separate from surrounding volume loss with otherwise stable appearance of bilateral pulmonary nodules. 3. Diffuse skeletal metastatic disease with similar distribution but with increasing sclerosis, findings may reflect sclerosis in the setting of ongoing therapy and treatment response or worsening. Subsequent imaging may be helpful in this regard to assess for further change. 4. Cholelithiasis without evidence of acute cholecystitis. 5. 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 6. Aortic atherosclerosis. Aortic Atherosclerosis (ICD10-I70.0). Electronically Signed   By: GJewel BaizeD.  On: 07/16/2020 10:41   US Thoracentesis Asp Pleural space w/IMG guide  Result Date: 07/11/2020 INDICATION: Patient with history of stage IV right lung cancer,  dyspnea, right pleural effusion. Request received for therapeutic right thoracentesis. EXAM: ULTRASOUND GUIDED THERAPEUTIC RIGHT THORACENTESIS MEDICATIONS: 1% lidocaine to skin and subcutaneous tissue COMPLICATIONS: None immediate. PROCEDURE: An ultrasound guided thoracentesis was thoroughly discussed with the patient and questions answered. The benefits, risks, alternatives and complications were also discussed. The patient understands and wishes to proceed with the procedure. Written consent was obtained. Ultrasound was performed to localize and mark an adequate pocket of fluid in the right chest. The area was then prepped and draped in the normal sterile fashion. 1% Lidocaine was used for local anesthesia. Under ultrasound guidance a 6 Fr Safe-T-Centesis catheter was introduced. Thoracentesis was performed. The catheter was removed and a dressing applied. FINDINGS: A total of approximately 900 cc of bloody fluid was removed. Due to patient chest discomfort only the above amount of fluid was removed today. IMPRESSION: Successful ultrasound guided therapeutic right thoracentesis yielding 900 cc of pleural fluid. Read by: Rowe Robert, PA-C Electronically Signed   By: Ruthann Cancer MD   On: 07/11/2020 13:03    ASSESSMENT AND PLAN:  This is a very pleasant 75 years old Hispanic female with metastatic non-small cell lung cancer, adenocarcinoma diagnosed in March 2017 status post induction systemic chemotherapy with carboplatin and Alimta and she is currently on maintenance treatment with single agent Alimta status post 73 cycles. The patient has been tolerating this treatment well but recently she started having more increasing fatigue and weakness as well as shortness of breath.  She had repeat CT scan of the chest, abdomen pelvis performed recently.  I personally and independently reviewed the scan images and discussed the results with the patient and her brother. Unfortunately her scan showed evidence for  disease progression with increase in pleural effusion especially on the right side as well as enlargement of many of the lung nodules and masses. I recommended for the patient to discontinue her current treatment with Alimta at this point because of the disease progression. I discussed with the patient other treatment options including palliative care and hospice versus consideration of second line treatment with immunotherapy with nivolumab 480 mg IV every 4 weeks until disease progression or unacceptable toxicity.  I also discussed with the patient the option of second line chemotherapy with docetaxel and Cyramza but she would like to proceed with the immunotherapy. I discussed with the patient the adverse effect of this treatment including but not limited to immunotherapy mediated skin rash, diarrhea, inflammation of the lung, kidney, liver, thyroid or other endocrine dysfunction. She is expected to start the first dose of this treatment next week. The patient will come back for follow-up visit in 5 weeks for evaluation before starting cycle #2. For the lack of appetite, I will start the patient on Marinol 2.5 mg p.o. twice daily. For the metastatic bone disease, she will continue her current treatment with Zometa every 12-week. For the chronic back pain she is currently on treatment with gabapentin and Mobic as needed. The patient was advised to call immediately if she has any concerning symptoms in the interval.  The patient voices understanding of current disease status and treatment options and is in agreement with the current care plan. All questions were answered. The patient knows to call the clinic with any problems, questions or concerns. We can certainly see the patient much sooner if necessary.  Disclaimer: This note was dictated with voice recognition software. Similar sounding words can inadvertently be transcribed and may not be corrected upon review.

## 2020-07-16 NOTE — Progress Notes (Signed)
DISCONTINUE ON PATHWAY REGIMEN - Non-Small Cell Lung     A cycle is every 21 days:     Pemetrexed        Dose Mod: None  **Always confirm dose/schedule in your pharmacy ordering system**  REASON: Disease Progression PRIOR TREATMENT: [id number]: Pemetrexed 500 mg/m2 q21 Days Until Progression or Unacceptable Toxicity TREATMENT RESPONSE: Partial Response (PR)  START ON PATHWAY REGIMEN - Non-Small Cell Lung     A cycle is every 28 days:     Nivolumab   **Always confirm dose/schedule in your pharmacy ordering system**  Patient Characteristics: Stage IV Metastatic, Nonsquamous, Second Line - Chemotherapy/Immunotherapy, PS = 0, 1, No Prior PD-1/PD-L1  Inhibitor and Immunotherapy Candidate Therapeutic Status: Stage IV Metastatic Histology: Nonsquamous Cell ROS1 Rearrangement Status: Negative Other Mutations/Biomarkers: No Other Actionable Mutations Chemotherapy/Immunotherapy LOT: Second Line Chemotherapy/Immunotherapy Molecular Targeted Therapy: Not Appropriate KRAS G12C Mutation Status: Negative MET Exon 14 Mutation Status: Negative RET Gene Fusion Status: Negative EGFR Mutation Status: Negative/Wild Type NTRK Gene Fusion Status: Negative PD-L1 Expression Status: Quantity Not Sufficient ALK Rearrangement Status: Negative BRAF [id number] Mutation Status: Negative ECOG Performance Status: 1 Immunotherapy Candidate Status: Candidate for Immunotherapy Prior Immunotherapy Status: No Prior PD-1/PD-L1 Inhibitor Intent of Therapy: Non-Curative / Palliative Intent, Discussed with Patient

## 2020-07-16 NOTE — Patient Instructions (Addendum)
Nivolumab injection What is this medicine? NIVOLUMAB (nye VOL ue mab) is a monoclonal antibody. It treats certain types of cancer. Some of the cancers treated are colon cancer, head and neck cancer, Hodgkin lymphoma, lung cancer, and melanoma. This medicine may be used for other purposes; ask your health care provider or pharmacist if you have questions. COMMON BRAND NAME(S): Opdivo What should I tell my health care provider before I take this medicine? They need to know if you have any of these conditions:  autoimmune diseases like Crohn's disease, ulcerative colitis, or lupus  have had or planning to have an allogeneic stem cell transplant (uses someone else's stem cells)  history of chest radiation  history of organ transplant  nervous system problems like myasthenia gravis or Guillain-Barre syndrome  an unusual or allergic reaction to nivolumab, other medicines, foods, dyes, or preservatives  pregnant or trying to get pregnant  breast-feeding How should I use this medicine? This medicine is for infusion into a vein. It is given by a health care professional in a hospital or clinic setting. A special MedGuide will be given to you before each treatment. Be sure to read this information carefully each time. Talk to your pediatrician regarding the use of this medicine in children. While this drug may be prescribed for children as young as 12 years for selected conditions, precautions do apply. Overdosage: If you think you have taken too much of this medicine contact a poison control center or emergency room at once. NOTE: This medicine is only for you. Do not share this medicine with others. What if I miss a dose? It is important not to miss your dose. Call your doctor or health care professional if you are unable to keep an appointment. What may interact with this medicine? Interactions have not been studied. This list may not describe all possible interactions. Give your health  care provider a list of all the medicines, herbs, non-prescription drugs, or dietary supplements you use. Also tell them if you smoke, drink alcohol, or use illegal drugs. Some items may interact with your medicine. What should I watch for while using this medicine? This drug may make you feel generally unwell. Continue your course of treatment even though you feel ill unless your doctor tells you to stop. You may need blood work done while you are taking this medicine. Do not become pregnant while taking this medicine or for 5 months after stopping it. Women should inform their doctor if they wish to become pregnant or think they might be pregnant. There is a potential for serious side effects to an unborn child. Talk to your health care professional or pharmacist for more information. Do not breast-feed an infant while taking this medicine or for 5 months after stopping it. What side effects may I notice from receiving this medicine? Side effects that you should report to your doctor or health care professional as soon as possible:  allergic reactions like skin rash, itching or hives, swelling of the face, lips, or tongue  breathing problems  blood in the urine  bloody or watery diarrhea or black, tarry stools  changes in emotions or moods  changes in vision  chest pain  cough  dizziness  feeling faint or lightheaded, falls  fever, chills  headache with fever, neck stiffness, confusion, loss of memory, sensitivity to light, hallucination, loss of contact with reality, or seizures  joint pain  mouth sores  redness, blistering, peeling or loosening of the skin, including inside the  mouth  severe muscle pain or weakness  signs and symptoms of high blood sugar such as dizziness; dry mouth; dry skin; fruity breath; nausea; stomach pain; increased hunger or thirst; increased urination  signs and symptoms of kidney injury like trouble passing urine or change in the amount of  urine  signs and symptoms of liver injury like dark yellow or brown urine; general ill feeling or flu-like symptoms; light-colored stools; loss of appetite; nausea; right upper belly pain; unusually weak or tired; yellowing of the eyes or skin  swelling of the ankles, feet, hands  trouble passing urine or change in the amount of urine  unusually weak or tired  weight gain or loss Side effects that usually do not require medical attention (report to your doctor or health care professional if they continue or are bothersome):  bone pain  constipation  decreased appetite  diarrhea  muscle pain  nausea, vomiting  tiredness This list may not describe all possible side effects. Call your doctor for medical advice about side effects. You may report side effects to FDA at 1-800-FDA-1088. Where should I keep my medicine? This drug is given in a hospital or clinic and will not be stored at home. NOTE: This sheet is a summary. It may not cover all possible information. If you have questions about this medicine, talk to your doctor, pharmacist, or health care provider.  2021 Elsevier/Gold Standard (2019-09-26 10:08:25) Nivolumab injection Qu es este medicamento? El NIVOLUMAB es un anticuerpo monoclonal. Se utiliza en el tratamiento del cncer de colon, cncer de esfago, cncer de cabeza y cuello, linfoma de Hodgkin, cncer de rin, cncer de hgado, cncer de pulmn, mesotelioma, melanoma y cncer urotelial. Este medicamento puede ser utilizado para otros usos; si tiene alguna pregunta consulte con su proveedor de atencin mdica o con su farmacutico. MARCAS COMUNES: Opdivo Qu le debo informar a mi profesional de la salud antes de tomar este medicamento? Necesitan saber si usted presenta alguno de los siguientes problemas o situaciones: enfermedades autoinmunes tales como enfermedad de Crohn, colitis ulcerativa o lupus ha tenido o planea tener un trasplante alognico de Education officer, museum ([place] las clulas madre de Theatre manager persona) antecedentes de radiacin en el pecho antecedentes de trasplante de rganos problemas del sistema nervioso, tales como miastenia grave o sndrome de Curator una reaccin alrgica o inusual al nivolumab, a otros medicamentos, alimentos, colorantes o conservantes si est embarazada o buscando quedar embarazada si est amamantando a un beb Cmo debo utilizar este medicamento? Este medicamento se administra mediante infusin en una vena. Lo administra un profesional de Technical sales engineer en un hospital o en un entorno clnico. Se le entregar una Gua del medicamento (MedGuide, su nombre en ingls) especial antes de cada tratamiento. Asegrese de leer esta informacin cada vez cuidadosamente. Hable con su pediatra para informarse acerca del uso de este medicamento en nios. Aunque este medicamento se puede recetar a nios tan pequeos como de 12 aos de edad con ciertas afecciones, existen precauciones que deben tomarse. Sobredosis: Pngase en contacto inmediatamente con un centro toxicolgico o una sala de urgencia si usted cree que haya tomado demasiado medicamento. ATENCIN: [employer] es solo para usted. No comparta este medicamento con nadie. Qu sucede si me olvido de una dosis? Es importante no olvidar ninguna dosis. Informe a su mdico o a su profesional de la salud si no puede asistir a Photographer. Qu puede interactuar con este medicamento? No se han estudiado las interacciones. Puede ser que esta lista no  menciona todas las posibles interacciones. Informe a su profesional de [employer]	[place] de [employer] productos a base de hierbas, medicamentos de [place] o suplementos nutritivos que est tomando. Si usted fuma, consume bebidas alcohlicas o si utiliza drogas ilegales, indqueselo tambin a su profesional de [employer]	[place]. Algunas sustancias pueden interactuar con su medicamento. A qu debo estar atento al usar [employer]? Este medicamento podra  hacerle sentir un Tree surgeon general. Contine con el tratamiento aun si se siente enfermo, a menos que su mdico le indique que lo suspenda. Usted podra necesitar realizarse [employer] de sangre mientras est usando [place]. No debe quedar embarazada mientras est usando este medicamento o por 5 meses despus de dejar de usarlo. Las mujeres deben informar a su mdico si estn buscando quedar embarazadas o si creen que podran estar embarazadas. Existe la posibilidad de efectos secundarios graves en un beb sin nacer. Para obtener ms informacin, hable con su profesional de la salud o su farmacutico. No debe Economist a un beb mientras est usando este medicamento o por 5 meses despus de dejar de usarlo. Qu efectos secundarios puedo tener al [employer] este medicamento? Efectos secundarios que debe informar a su mdico o a Barrister's clerk de la salud tan pronto como sea posible: Chief of Staff, tales como erupcin cutnea, comezn/picazn o urticaria, e hinchazn de la cara, los labios o la lengua problemas para respirar sangre en la orina diarrea con sangre o acuosa, o heces de color negro y aspecto alquitranado cambios en las emociones o el estado de nimo cambios en la visin dolor en el pecho tos mareos sensacin de [place] o aturdimiento, cadas fiebre, escalofros dolor de cabeza con fiebre, rigidez del cuello, confusin, prdida de memoria, sensibilidad a la luz, alucinaciones, prdida de contacto con la realidad, o convulsiones dolor en las articulaciones llagas en la boca enrojecimiento, formacin de ampollas, descamacin o distensin de la piel, incluso dentro de la boca dolor muscular o debilidad muscular grave signos y sntomas de niveles elevados de azcar en la sangre, tales como mareo; boca seca; piel seca; aliento frutal; nuseas; dolor de estmago; aumento del apetito o la sed; aumento de la frecuencia urinaria signos y sntomas de lesin renal, tales como dificultad para Garment/textile technologist  o cambios en la cantidad de orina signos y sntomas de lesin en el hgado, tales como orina amarilla oscura o Forensic psychologist; sensacin general de estar enfermo o sntomas gripales; heces claras; prdida del apetito; nuseas; dolor en la regin abdominal superior derecha; debilidad o cansancio inusuales; color amarillento de los ojos o la piel hinchazn de tobillos, pies, manos dificultad para orinar o cambios en la cantidad de [place] debilidad o cansancio inusuales aumento o prdida de peso Efectos secundarios que generalmente no requieren atencin mdica (infrmelos a su mdico o a Barrister's clerk de la salud si persisten o si son molestos): dolor de [employer] estreimiento disminucin del apetito diarrea dolor muscular nuseas, vmito cansancio Puede ser que esta lista no menciona todos los posibles efectos secundarios. Comunquese a su mdico por asesoramiento mdico [employer]. Usted puede informar los efectos secundarios a la FDA por telfono al 1-800-FDA-1088. Dnde debo guardar mi medicina? Este medicamento se administra en hospitales o clnicas, y no necesitar guardarlo en su domicilio. ATENCIN: Este folleto es un resumen. Puede ser que no cubra toda la posible informacin. Si usted tiene preguntas acerca de esta medicina, consulte con su mdico, su farmacutico o su profesional de Technical sales engineer.  2021 Elsevier/Gold Standard (2019-09-27 00:00:00)  Rogelia Boga  pleural permanente, gua para el hogar Indwelling Pleural Catheter Home Guide  Una sonda pleural permanente es un tubo delgado y flexible (catter) que se inserta debajo de la piel en el pecho. El catter drena el exceso de lquido que se acumula entre la pared del pecho y los pulmones (espacio pleural).Luego de Sport and exercise psychologist, este se conecta a un frasco que Radio producer lquido. El catter pleural le permite drenar el lquido del pecho en su casa peridicamente (a veces, todos los [place]). Esto eliminar la necesidad de hacer visitas  frecuentes al hospital o a la clnica para drenar el lquido. El catter puede quitarse luego de que el problema de exceso de lquido se resuelva, por lo general, luego de 2 a 52meses. Es importante seguir las indicaciones del mdico acerca de cmo debe realizar el drenaje y Research scientist (physical sciences). Cules son los riesgos? En general, se trata de un procedimiento seguro. Sin embargo, pueden ocurrir complicaciones, por ejemplo:  Infeccin.  Lesin cutnea alrededor del catter.  Lesin pulmonar.  Mal funcionamiento del tubo pleural.  Propagacin de clulas cancerosas por el catter, si tiene cncer. Suministros necesarios:  Frasco de recoleccin cerrado al vaco con lnea de drenaje incluida.  Vendaje estril.  Almohadillas estriles con alcohol.  Guantes estriles.  Tapa de vlvula.  Almohadillas de gasa estriles de 4x4pulgadas (10cmx10cm).  [employer].  Isaac Laud.  Almohadilla de gomaespuma estril para el catter. Cmo cuidar el catter y TEFL teacher de insercin  Lvese las manos con agua tibia y jabn antes y despus de tocar el catter o TEFL teacher de insercin. Use desinfectante para manos si no dispone de [place] y [place].  Controle la venda (vendaje) todos los das y asegrese de que est limpia y Audiological scientist.  Mantenga la piel que rodea el catter limpia y [place].  Controle el catter con regularidad para verificar que el tubo no est rajado ni torcido.  Controle TEFL teacher de insercin del catter [employer] para descartar signos de infeccin. Est atento a los siguientes signos: ? Lesiones cutneas. ? Enrojecimiento, hinchazn o dolor. ? Lquido o sangre. ? Calor. ? Pus o mal olor. Cmo drenar el catter Es posible que deba drenar el catter [employer] o con la frecuencia que le haya indicado el mdico. Siga las indicaciones del mdico acerca de cmo debe drenar el catter. Tambin puede [employer] instrucciones que vienen con el sistema de [place].  Para drenar el catter: 1. Lvese las manos con agua tibia y [place]. Use desinfectante para manos si no dispone de [place] y [place]. 2. Con cuidado, quite el vendaje alrededor del catter. 3. Lvese bien las manos nuevamente. 4. Pngase los guantes provistos. 5. Prepare el frasco de recoleccin cerrado al vaco y la lnea de drenaje. Cierre la lnea de drenaje del frasco de recoleccin al apretar la presilla o girar la rueda de la pinza reguladora en direccin al frasco. El vaco del frasco se perder si la lnea no est completamente cerrada. 6. Quite la tapa de la punta de acceso de la lnea de drenaje. No toque el extremo. Colquela sobre una superficie estril. 7. Quite la tapa de la vlvula del catter y deschela. 8. Use una almohadilla con alcohol para limpiar el extremo del catter. 9. Introduzca la punta de acceso en la vlvula del catter. Asegrese de que la vlvula y la punta de acceso estn conectadas de forma segura. Para confirmarlo, debe escuchar un clic. 10. Introduzca el mbolo"T" para romper el vaco  del frasco de recoleccin. 11. Abra la presilla de la lnea de drenaje. 12. Deje drenar el catter. Mantenga el catter y el frasco de recoleccin por debajo del nivel del pecho. Puede que el extremo del tubo tenga una vlvula de un solo sentido que permitir que el lquido y el aire fluyan fuera del catter sin dejar que entre aire. 13. Drene la cantidad de lquido que le haya indicado el mdico. [place], el proceso [place] 5 y 68minutos. No drene ms de 1034ml de lquido. Es posible que sienta una leve molestia durante el drenaje. Si el dolor es muy intenso, suspenda el drenaje y comunquese con el mdico. 33. Despus de Manufacturing systems engineer, quite el tubo del frasco de recoleccin del catter. 15. Use una almohadilla con alcohol para limpiar la punta del catter. 16. Coloque una tapa limpia en el extremo del catter. 17. Use una almohadilla con alcohol para limpiar la piel  alrededor del catter. 66. Deje que la piel se seque al [employer]. 19. Colquese la almohadilla para el catter [employer]. Enrolle el catter y [employer] almohadilla. No coloque el catter [employer]. 20. Cambie el vendaje del catter. 21. Deseche el frasco de recoleccin como se lo haya indicado el mdico. No lo reutilice. Cmo cambiar el vendaje [place] el vendaje al menos una vez por semana o con ms frecuencia, si fuera necesario, para mantenerlo seco. Asegrese de cambiar el vendaje cuando comience a humedecerse. El Viacom dir con qu frecuencia debe cambiar el vendaje. 1. Lvese las manos con agua tibia y [place]. Use desinfectante para manos si no dispone de [place] y [place]. 2. Retire con cuidado el vendaje usado. No utilice tijeras para quitar el vendaje. Los objetos filosos pueden daar Print production planner. 3. Limpie la piel del lugar de insercin con jabn suave sin perfume y agua tibia. Enjuague bien y seque el rea con un pao limpio dando golpecitos. 4. Controle la piel alrededor del catter para detectar signos de infeccin. Est atento a los siguientes signos: ? Lesiones cutneas. ? Enrojecimiento, hinchazn o dolor. ? Lquido o sangre. ? Calor. ? Pus o mal olor. 5. Si le conectaron el catter a la piel con puntos (suturas), contrlelos para asegurarse de que sigan fijados a la piel. 6. No aplique cremas, ungentos ni alcohol en la zona. Deje que la piel se seque al aire por completo antes de colocar un nuevo vendaje. 7. Enrolle el catter y colquelo sobre la almohadilla estril para el catter. No coloque el catter [employer]. 8. Si no tiene una almohadilla, use un vendaje limpio. Deslice el vendaje por debajo del disco que mantiene el catter de Nurse, mental health. 9. Use gasa para cubrir el catter y la almohadilla. El catter debe estar colocado sobre la almohadilla o el vendaje, no sobre la piel. 10. Pegue el drenaje a la piel con cinta adhesiva. Puede que le  indiquen que use un vendaje [employer] en lugar de gasa y tela [place]. [place] con agua tibia y [place]. Use desinfectante para manos si no dispone de [place] y [place]. Recomendaciones generales  Siempre lvese las manos con agua tibia y jabn antes y despus de manipular el catter y el frasco de Theatre stage manager. Use un Ashok Cordia y sin perfume. Use desinfectante para manos si no dispone de [place] y [place].  Siempre asegrese de que no haya filtraciones en el catter o el frasco de Theatre stage manager.  Cada vez que drene el  catter, preste atencin al color y a la cantidad de lquido.  No toque la punta del catter o el tubo del frasco de Theatre stage manager.  No reutilice los frascos de recoleccin.  No tome baos de inmersin, no nade ni use el jacuzzi hasta que el mdico lo autorice. Pregntele al mdico si puede ducharse. Thurston Pounds solo le permitan darse baos de [place].  Respire hondo con frecuencia y [employer]. Esto ayuda a prevenir una infeccin pulmonar. Comunquese con un mdico si:  Tiene preguntas sobre los cuidados del catter o del frasco de Theatre stage manager.  An siente dolor en el lugar de insercin del catter ms de 2das despus del procedimiento.  Siente dolor al drenar el catter.  El catter se dobla, se tuerce o se rompe.  La conexin entre el catter y el frasco de Theatre stage manager se afloja.  Tiene alguno de estos signos alrededor del Environmental consultant de insercin del catter o alguna de estas secreciones: ? Lesiones cutneas. ? Enrojecimiento, hinchazn o dolor. ? Lquido o sangre. ? Calor. ? Pus o mal olor. Solicite ayuda de inmediato si:  Tiene fiebre o escalofros.  Siente dolor en el pecho.  Tiene mareos o falta de Occupational hygienist.  Tiene mucho enrojecimiento, hinchazn o Management consultant de insercin del catter.  El catter se Diplomatic Services operational officer.  El catter se obstruye o se tapa. Resumen  Una sonda pleural permanente es un tubo delgado y flexible (catter) que se inserta debajo de  la piel en el pecho. El catter drena el exceso de lquido que se acumula entre la pared del pecho y los pulmones (espacio pleural).  Es importante seguir las indicaciones del mdico acerca de cmo debe realizar el drenaje y Research scientist (physical sciences).  No toque la punta del catter o el tubo del frasco de Theatre stage manager.  Siempre lvese las manos con agua y jabn antes y despus de Chiropractor el catter y el frasco de Theatre stage manager. Use desinfectante para manos si no dispone de [place] y [place]. Esta informacin no tiene Marine scientist el consejo del mdico. Asegrese de hacerle al mdico cualquier pregunta que tenga. Document Revised: 03/24/2020 Document Reviewed: 02/08/2020 Elsevier Patient Education  2021 [employer].

## 2020-07-17 ENCOUNTER — Telehealth: Payer: Self-pay | Admitting: Medical Oncology

## 2020-07-17 NOTE — Telephone Encounter (Signed)
Fevers- sat of 101, low grade Sunday and Monday . Normal today. "Why do I have fever?"  I told her it may be inflammation from the fluid in her lung irritating the lining of her lung.  Nivolumab scheduled for Monday.

## 2020-07-17 NOTE — Telephone Encounter (Signed)
We will monitor for now.

## 2020-07-18 ENCOUNTER — Encounter: Payer: Self-pay | Admitting: Internal Medicine

## 2020-07-18 ENCOUNTER — Ambulatory Visit (INDEPENDENT_AMBULATORY_CARE_PROVIDER_SITE_OTHER): Payer: Medicare Other | Admitting: Internal Medicine

## 2020-07-18 ENCOUNTER — Telehealth: Payer: Self-pay | Admitting: Internal Medicine

## 2020-07-18 ENCOUNTER — Other Ambulatory Visit: Payer: Self-pay

## 2020-07-18 VITALS — BP 131/82 | HR 123 | Temp 97.8°F | Resp 20 | Ht 63.0 in | Wt 140.0 lb

## 2020-07-18 DIAGNOSIS — R809 Proteinuria, unspecified: Secondary | ICD-10-CM

## 2020-07-18 DIAGNOSIS — C3491 Malignant neoplasm of unspecified part of right bronchus or lung: Secondary | ICD-10-CM | POA: Diagnosis not present

## 2020-07-18 NOTE — Progress Notes (Signed)
Pre visit review using our clinic review tool, if applicable. No additional management support is needed unless otherwise documented below in the visit note. 

## 2020-07-18 NOTE — Patient Instructions (Addendum)
  Please provide a urine sample at the lab

## 2020-07-18 NOTE — Telephone Encounter (Signed)
Called pt to confirm appts for 2/15 - pt is aware and confirmed appts.

## 2020-07-18 NOTE — Progress Notes (Signed)
Subjective:    Patient ID: Valerie Long, female    DOB: July 11, 1945, 75 y.o.   MRN: [id number]  DOS:  07/18/2020 Type of visit - description: Acute  For the last 2 weeks she has noted "foam" in the urine and she knows that is a sign of proteinuria, would like to be tested.  Note from oncology reviewed, cancer is progressing.  She admits to weakness and shortness of breath  Review of Systems Denies current fever No dysuria or gross hematuria.  No difficulty urinating or urinary frequency   Past Medical History:  Diagnosis Date  . Adenocarcinoma of right lung, stage 4 ([place]) 2017  . Anemia   . Arthritis   . Bone metastases ([place]) 02/24/2017  . Encounter for antineoplastic chemotherapy 03/17/2016  . Hypercholesterolemia   . Hypertension 06/18/2016  . Osteopenia   . 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 Pneumonia   . Shortness of breath dyspnea     Past Surgical History:  Procedure Laterality Date  . CESAREAN SECTION     myomectomy  . COLONOSCOPY W/ POLYPECTOMY    . IR GENERIC HISTORICAL  08/17/2016   IR US GUIDE VASC ACCESS RIGHT 08/17/2016 [name], MD [facility]  . IR GENERIC HISTORICAL  08/17/2016   IR FLUORO GUIDE PORT INSERTION RIGHT 08/17/2016 [name], MD [facility]  . MEDIASTINOSCOPY N/A 09/25/2015   Procedure: MEDIASTINOSCOPY;  Surgeon: Melrose Nakayama, MD;  Location: [facility];  Service: Thoracic;  Laterality: N/A;  . VIDEO BRONCHOSCOPY Bilateral 08/19/2015   Procedure: VIDEO BRONCHOSCOPY WITH FLUORO;  Surgeon: Rigoberto Noel, MD;  Location: [facility];  Service: Cardiopulmonary;  Laterality: Bilateral;  . VIDEO BRONCHOSCOPY N/A 09/08/2015   Procedure: VIDEO BRONCHOSCOPY WITH FLUORO;  Surgeon: Rigoberto Noel, MD;  Location: [facility];  Service: Thoracic;  Laterality: N/A;  . VIDEO BRONCHOSCOPY WITH ENDOBRONCHIAL ULTRASOUND Right 09/08/2015   Procedure: ATTEMPTED VIDEO BRONCHOSCOPY ENDOBRONCHIAL ULTRASOUND  ;  Surgeon: Rigoberto Noel, MD;   Location: [facility];  Service: Thoracic;  Laterality: Right;    Allergies as of 07/18/2020   No Known Allergies     Medication List       Accurate as of July 18, 2020 11:59 PM. If you have any questions, ask your nurse or doctor.        STOP taking these medications   amoxicillin-clavulanate 875-125 MG tablet Commonly known as: AUGMENTIN Stopped by: [name], MD   Meloxicam 10 MG Caps Stopped by: [name], MD     TAKE these medications   acetaminophen 500 MG tablet Commonly known as: TYLENOL Take 1,000 mg by mouth every 6 (six) hours as needed.   budesonide-formoterol 80-4.5 MCG/ACT inhaler Commonly known as: SYMBICORT Inhale 2 puffs into the lungs 2 (two) times daily.   dextromethorphan-guaiFENesin 30-600 MG 12hr tablet Commonly known as: MUCINEX DM Take 1 tablet by mouth 2 (two) times daily.   docusate sodium 100 MG capsule Commonly known as: COLACE take   dronabinol 2.5 MG capsule Commonly known as: MARINOL Take 1 capsule (2.5 mg total) by mouth 2 (two) times daily before a meal.   folic acid 1 MG tablet Commonly known as: FOLVITE Take 1 tablet (1 mg total) by mouth daily.   gabapentin 300 MG capsule Commonly known as: NEURONTIN Take 200 mg by mouth 3 (three) times daily.   IRON PO Take 1 tablet by mouth daily.   lidocaine-prilocaine cream Commonly known as: EMLA Apply 1  application topically as needed.   Omega-3 1000 MG Caps   ondansetron 8 MG tablet Commonly known as: ZOFRAN Take 8 mg by mouth every 8 (eight) hours as needed for nausea or vomiting.   VITAMIN D3 PO Take 2 tablets by mouth daily.          Objective:   Physical Exam BP 131/82 (BP Location: Left Arm, Patient Position: Sitting, Cuff Size: Small)   Pulse (!) 123   Temp 97.8 F (36.6 C) (Oral)   Resp 20   Ht 5\' 3"  (1.6 m)   Wt 140 lb (63.5 kg)   LMP 06/09/1998   SpO2 96%   BMI 24.80 kg/m  General:   Well developed, has a difficult time completing long sentences but no  obvious respiratory distress, not toxic appearing HEENT:  Normocephalic . Face symmetric, atraumatic Neurologic:  alert & oriented X3.  Speech normal, gait assisted by walker psych--  Cognition and judgment appear intact.  Cooperative with normal attention span and concentration.  Behavior appropriate. No anxious or depressed appearing.      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 Dx  March 2017  PLAN: Proteinuria?  See HPI, will go ahead and check microalbumin, even if results are + advised patient that I do not think is pertinent to pursue a work-up. Lung cancer: Note from oncology reviewed, cancer has progressed, she was offered:  treatment versus palliative/hospice care. So far she elected to stop and limit and start biological. She also has pleural effusions, had a thoracentesis R 07/11/20. She and her sister who is here know the prognosis is poor.  Support provided  This visit occurred during the SARS-CoV-2 public health emergency.  Safety protocols were in place, including screening questions prior to the visit, additional usage of staff PPE, and extensive cleaning of exam room while observing appropriate contact time as indicated for disinfecting solutions.

## 2020-07-19 LAB — MICROALBUMIN / CREATININE URINE RATIO
Creatinine, Urine: 48 mg/dL (ref 20–275)
Microalb Creat Ratio: 21 mcg/mg creat (ref <30)
Microalb, Ur: 1 mg/dL

## 2020-07-20 NOTE — Assessment & Plan Note (Signed)
Proteinuria?  See HPI, will go ahead and check microalbumin, even if results are + advised patient that I do not think is pertinent to pursue a work-up. Lung cancer: Note from oncology reviewed, cancer has progressed, she was offered:  treatment versus palliative/hospice care. So far she elected to stop and limit and start biological. She also has pleural effusions, had a thoracentesis R 07/11/20. She and her sister who is here know the prognosis is poor.  Support provided

## 2020-07-21 ENCOUNTER — Ambulatory Visit: Payer: Medicare Other | Admitting: Internal Medicine

## 2020-07-21 NOTE — Progress Notes (Signed)
[place], MD [address] [place] [address]  DIAGNOSIS: Stage IV (T1b, N2, M1b) non-small cell lung cancer, adenocarcinoma diagnosed in March 2017 and presented with right lower lobe lung nodule in addition to mediastinal lymphadenopathy and metastatic bone lesions.   Molecular studies:PDL 1 TPS <1%.   [facility] Studies: Positive for ERBB2 [id number]. Negative for EGFR, KRAS, ALK, BRAF, MET, RET and ROS1.  PRIOR THERAPY:  1) Induction systemic chemotherapy with carboplatin for AUC of 5 and Alimta 500 MG/M2 every 3 weeks is status post 6 cycles at the [facility], last dose was given 03/05/2016 with stable disease. 2) palliative radiation to the metastatic bone disease in the lower back and pelvic area 3) Maintenance systemic chemotherapy with Alimta 500 MG/M2 every 3 weeks status post 73 cycles. First dose was given 03/25/2016.  Last dose was given June 16, 2020.  CURRENT THERAPY: 1) second line treatment with immunotherapy with nivolumab 480 IV every 4 weeks.  First dose July 22, 2020. 2) Zometa 4 mg IV every 12 week for metastatic bone disease.  INTERVAL HISTORY: Valerie Long 75 y.o. female returns to the clinic today for a follow up visit accompanied by her brother. The patient recently had evidence for disease progression with increased pleural effusions, and increase in size of the lung nodules/masses. She also was endorsing increased shortness of breath, fatigue, weakness, and decreased appetite. She was given the option of referral to palliative care and hospice vs second line chemotherapy with docetaxel and cyramza vs. immunotherapy with nivolumab. She opted to proceed with immunotherapy and she is here for her first dose of treatment today.   In the interval since her last appointment, she had a thoracentesis for her pleural effusion. They removed 900 ccs of fluid. Dr. Julien Nordmann  offered her a referral for a pleurex catheter placement but she is still thinking about it. She continues to report fatigue and weakness. She denies recent fevers. Denies chills. She reports her baseline night sweats. She lost an additional 5 lbs since her last appointment. Dr. Julien Nordmann started her on Marinol but she has not started it yet.  Continues to have cough and dyspnea on exertion but it is a little better since her thoracentesis. She denies chest pain. Denies hemoptysis. Denies nausea, vomiting, diarrhea, or constipation. Denies headaches or visual changes. She is here for evaluation before starting cycle #1.   MEDICAL HISTORY: Past Medical History:  Diagnosis Date  . Adenocarcinoma of right lung, stage 4 ([place]) 2017  . Anemia   . Arthritis   . Bone metastases ([place]) 02/24/2017  . Encounter for antineoplastic chemotherapy 03/17/2016  . Hypercholesterolemia   . Hypertension 06/18/2016  . Osteopenia   . 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 Pneumonia   . Shortness of breath dyspnea     ALLERGIES:  has No Known Allergies.  MEDICATIONS:  Current Outpatient Medications  Medication Sig Dispense Refill  . acetaminophen (TYLENOL) 500 MG tablet Take 1,000 mg by mouth every 6 (six) hours as needed.    . budesonide-formoterol (SYMBICORT) 80-4.5 MCG/ACT inhaler Inhale 2 puffs into the lungs 2 (two) times daily.    . Cholecalciferol (VITAMIN D3 PO) Take 2 tablets by mouth daily.    Marland Kitchen dextromethorphan-guaiFENesin (MUCINEX DM) 30-600 MG 12hr tablet Take 1 tablet by mouth 2 (two) times daily.    Marland Kitchen docusate sodium (COLACE) 100 MG capsule take (  Patient not taking: No sig reported)    . dronabinol (MARINOL) 2.5 MG capsule Take 1 capsule (2.5 mg total) by mouth 2 (two) times daily before a meal. 60 capsule 0  . Ferrous Sulfate (IRON PO) Take 1 tablet by mouth daily.    . folic acid (FOLVITE) 1 MG tablet Take 1 tablet (1 mg total) by mouth daily. 90 tablet 0  . gabapentin (NEURONTIN)  300 MG capsule Take 200 mg by mouth 3 (three) times daily.    Marland Kitchen lidocaine-prilocaine (EMLA) cream Apply 1 application topically as needed.    . Omega-3 1000 MG CAPS     . ondansetron (ZOFRAN) 8 MG tablet Take 8 mg by mouth every 8 (eight) hours as needed for nausea or vomiting.     No current facility-administered medications for this visit.    SURGICAL HISTORY:  Past Surgical History:  Procedure Laterality Date  . CESAREAN SECTION     myomectomy  . COLONOSCOPY W/ POLYPECTOMY    . IR GENERIC HISTORICAL  08/17/2016   IR US GUIDE VASC ACCESS RIGHT 08/17/2016 Valerie Cadet, MD [facility]  . IR GENERIC HISTORICAL  08/17/2016   IR FLUORO GUIDE PORT INSERTION RIGHT 08/17/2016 Valerie Cadet, MD [facility]  . MEDIASTINOSCOPY N/A 09/25/2015   Procedure: MEDIASTINOSCOPY;  Surgeon: Melrose Nakayama, MD;  Location: [facility];  Service: Thoracic;  Laterality: N/A;  . VIDEO BRONCHOSCOPY Bilateral 08/19/2015   Procedure: VIDEO BRONCHOSCOPY WITH FLUORO;  Surgeon: Rigoberto Noel, MD;  Location: [facility];  Service: Cardiopulmonary;  Laterality: Bilateral;  . VIDEO BRONCHOSCOPY N/A 09/08/2015   Procedure: VIDEO BRONCHOSCOPY WITH FLUORO;  Surgeon: Rigoberto Noel, MD;  Location: [facility];  Service: Thoracic;  Laterality: N/A;  . VIDEO BRONCHOSCOPY WITH ENDOBRONCHIAL ULTRASOUND Right 09/08/2015   Procedure: ATTEMPTED VIDEO BRONCHOSCOPY ENDOBRONCHIAL ULTRASOUND  ;  Surgeon: Rigoberto Noel, MD;  Location: [facility];  Service: Thoracic;  Laterality: Right;    REVIEW OF SYSTEMS:   Review of Systems  Constitutional: Positive for fatigue, weight loss, and appetite change. Negative for chills and fever.   HENT: Negative for mouth sores, nosebleeds, sore throat and trouble swallowing.   Eyes: Negative for eye problems and icterus.  Respiratory: Positive for cough and shortness of breath with exertion. Negative forhemoptysis and wheezing.   Cardiovascular: Positive for baseline leg swelling. Negative for chest pain  Gastrointestinal: Negative for abdominal pain, constipation, diarrhea, nausea and vomiting.  Genitourinary: Negative for bladder incontinence, difficulty urinating, dysuria, frequency and hematuria.   Musculoskeletal: Positive for chronic back pain. Negative for gait problem, neck pain and neck stiffness.  Skin: Negative for itching and rash.  Neurological: Negative for dizziness, extremity weakness, gait problem, headaches, light-headedness and seizures.  Hematological: Negative for adenopathy. Does not bruise/bleed easily.  Psychiatric/Behavioral: Negative for confusion, depression and sleep disturbance. The patient is not nervous/anxious.     PHYSICAL EXAMINATION:  Blood pressure 132/81, pulse (!) 105, temperature 98.2 F (36.8 C), temperature source Tympanic, resp. rate 19, height '5\' 3"'  (1.6 m), weight 135 lb 6.4 oz (61.4 kg), last menstrual period 06/09/1998, SpO2 100 %.  ECOG PERFORMANCE STATUS: 2 - Symptomatic, <50% confined to bed  Physical Exam  Constitutional: Oriented to person, place, and time and well-developed, well-nourished, and in no distress.  HENT:  Head: Normocephalic and atraumatic.  Mouth/Throat: Oropharynx is clear and moist. No oropharyngeal exudate.  Eyes: Conjunctivae are normal. Right eye exhibits no discharge. Left eye exhibits no discharge. No scleral icterus.  Neck: Normal range of  motion. Neck supple.  Cardiovascular: Normal rate, regular rhythm, normal heart sounds and intact distal pulses.   Pulmonary/Chest: Effort normal. Decreased breath sounds at the base of the right lung. No respiratory distress. No wheezes. No rales.  Abdominal: Soft. Bowel sounds are normal. Exhibits no distension and no mass. There is no tenderness.  Musculoskeletal: Positive for bilateral lower extremity edema. Normal range of motion.   Lymphadenopathy:    No cervical adenopathy.  Neurological: Alert and oriented to person, place, and time. Exhibits normal muscle tone. Gait  normal. Coordination normal.  Skin: Skin is warm and dry. No rash noted. Not diaphoretic. No erythema. No pallor.  Psychiatric: Mood, memory and judgment normal.  Vitals reviewed.  LABORATORY DATA: Lab Results  Component Value Date   WBC 5.7 07/22/2020   HGB 10.0 (L) 07/22/2020   HCT 31.7 (L) 07/22/2020   MCV 94.6 07/22/2020   PLT 309 07/22/2020      Chemistry      Component Value Date/Time   NA 136 07/22/2020 0750   NA 137 06/09/2017 0901   K 4.0 07/22/2020 0750   K 3.9 06/09/2017 0901   CL 101 07/22/2020 0750   CO2 26 07/22/2020 0750   CO2 25 06/09/2017 0901   BUN 14 07/22/2020 0750   BUN 19.0 06/09/2017 0901   CREATININE 1.12 (H) 07/22/2020 0750   CREATININE 1.2 (H) 06/09/2017 0901   GLU 170 01/23/2016 0000      Component Value Date/Time   CALCIUM 9.6 07/22/2020 0750   CALCIUM 9.9 06/09/2017 0901   ALKPHOS 141 (H) 07/22/2020 0750   ALKPHOS 63 06/09/2017 0901   AST 27 07/22/2020 0750   AST 35 (H) 06/09/2017 0901   ALT 12 07/22/2020 0750   ALT 23 06/09/2017 0901   BILITOT 0.3 07/22/2020 0750   BILITOT 0.65 06/09/2017 0901       RADIOGRAPHIC STUDIES:  DG Chest 1 View  Result Date: 07/11/2020 CLINICAL DATA:  Right thoracentesis EXAM: CHEST  1 VIEW COMPARISON:  June 26, 2020 FINDINGS: No pneumothorax after right thoracentesis. The right-sided pleural effusion remains, moderate size. By report, 900 mL of fluid was removed. A smaller left effusion is stable. No other interval changes. IMPRESSION: Persistent moderate right pleural effusion after thoracentesis with no pneumothorax. Stable small left effusion. No other changes. Electronically Signed   By: Dorise Bullion III M.D   On: 07/11/2020 13:15   DG Chest 2 View  Result Date: 06/26/2020 CLINICAL DATA:  New onset left chest pain and shortness of breath. History of metastatic right lung cancer. EXAM: CHEST - 2 VIEW COMPARISON:  Chest x-ray 05/07/2020 and chest CT 05/15/2020. FINDINGS: The right-sided Port-A-Cath  is in good position, unchanged. Slightly larger moderate-sized right pleural effusion and new small left pleural effusion. Moderate atelectasis overlying the right effusion. Stable appearing right lower lobe mass. Numerous metastatic pulmonary nodules are again demonstrated. No pneumothorax.  The bony thorax is intact. IMPRESSION: Slightly larger moderate-sized right pleural effusion with overlying atelectasis and right lower lobe lung mass. Small left pleural effusion. Stable appearing metastatic pulmonary nodules. Electronically Signed   By: Marijo Sanes M.D.   On: 06/26/2020 15:36   CT Chest W Contrast  Result Date: 07/16/2020 CLINICAL DATA:  Primary Cancer Type: Lung Imaging Indication: Assess response to therapy Interval therapy since last imaging? Yes Initial Cancer Diagnosis Date: 09/25/2015; Established by: Biopsy-proven Detailed Pathology: Stage IV non-small cell lung cancer, adenocarcinoma. Primary Tumor location:  Right lower lobe. Metastatic bone lesions. Surgeries: No. Chemotherapy:  Yes; Ongoing? Yes; Most recent administration: 06/16/2020 Immunotherapy? No Radiation therapy? Yes; Date Range: 04/14/2017 - 04/26/2017; Target: Lumbar spine Other Cancer Therapies: Zometa EXAM: CT CHEST, ABDOMEN, AND PELVIS WITH CONTRAST TECHNIQUE: Multidetector CT imaging of the chest, abdomen and pelvis was performed following the standard protocol during bolus administration of intravenous contrast. CONTRAST:  112m OMNIPAQUE IOHEXOL 300 MG/ML  SOLN COMPARISON:  Most recent CT chest, abdomen and pelvis 05/15/2020. 08/21/2015 PET-CT. FINDINGS: CT CHEST FINDINGS Cardiovascular: Calcified atheromatous plaque in the thoracic aorta. Mild with ascending aortic dilation to 3.7 cm that is unchanged. Central pulmonary vasculature is unremarkable. Limited on venous phase assessment. Heart size stable small amount of pericardial fluid similar to previous imaging RIGHT-sided Port-A-Cath in place terminating at the caval to  atrial junction. Mediastinum/Nodes: No thoracic inlet lymphadenopathy. No mediastinal lymphadenopathy. Esophagus mildly patulous. No axillary lymphadenopathy. Lungs/Pleura: Enlargement of moderately large RIGHT pleural effusion, associated with further volume loss in the RIGHT lung base. This is, Mainly sub pulmonic with vague enhancing area in the RIGHT lung base measuring as much as 2.7 x 2.3 cm (image 48, series 2), this appears to represent a new abnormality in this location. Pleural thickening otherwise similar. LEFT-sided effusion increased as well with sub pulmonic fluid in the LEFT lung base. Pulmonary mass at the RIGHT lung base measuring 4.5 x 3.8 cm, difficult to separate from surrounding volume loss previously approximately 3.3 x 3.0 cm. (Image 36, series 4) 9 mm nodule along the major fissure in the LEFT chest within 1 mm of previous measurements. Numerous ill-defined nodules in the LEFT upper lobe with similar appearance, 13 mm nodule on image 65 of series 4 is stable as an example of this process in the upper lobe. LEFT lower lobe pulmonary nodule (image 59, series 4) 19 mm, previously 18 mm. Spiculation along bronchovascular structures in the RIGHT upper lobe (image 46, series 4) 1.6 x 1.1 cm, stable. Further collapse/volume loss in the RIGHT lower lobe. Musculoskeletal: See below for full musculoskeletal details. CT ABDOMEN PELVIS FINDINGS Hepatobiliary: No focal, suspicious hepatic lesion. Cholelithiasis. No pericholecystic stranding. Pancreas: Normal, without mass, inflammation or ductal dilatation. Spleen: Normal appearance of the spleen. Adrenals/Urinary Tract: Adrenal glands are normal. 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 No hydronephrosis. Symmetric renal enhancement accounting for scarring that is present on the LEFT. Urinary bladder under distended, otherwise unremarkable. Stomach/Bowel: No sign of bowel obstruction.  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 Ingested contrast media passing into the colon. Cecum in the pelvis as on the prior study. Vascular/Lymphatic: Calcified and noncalcified atheromatous plaque in the abdominal aorta. No aneurysmal dilation. There is no gastrohepatic or hepatoduodenal ligament lymphadenopathy. No retroperitoneal or mesenteric lymphadenopathy. No pelvic sidewall lymphadenopathy. Reproductive: Unremarkable by CT. Other: No ascites. Musculoskeletal: Spinal degenerative changes and diffuse sclerotic metastatic disease showing a similar distribution compared to prior imaging. Some increased sclerosis suspected, for instance at T4-T5 the vertebral bodies in general appear more sclerotic. Lesions from T7 through T10 as an example also with some increased sclerosis since previous imaging. RIGHT iliac lesion on sagittal image 50 showing a halo of increasing sclerosis as compared to previous studies. IMPRESSION: 1. Increasing bilateral pleural effusions now with nodular area along the periphery of RIGHT-sided effusion suspicious for worsening of disease. 2. Enlarging RIGHT lower lobe pulmonary mass, difficult to separate from surrounding volume loss with otherwise stable appearance of bilateral pulmonary nodules. 3. Diffuse skeletal metastatic disease with similar distribution but with increasing sclerosis,  findings may reflect sclerosis in the setting of ongoing therapy and treatment response or worsening. Subsequent imaging may be helpful in this regard to assess for further change. 4. Cholelithiasis without evidence of acute cholecystitis. 5. 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 6. Aortic atherosclerosis. Aortic Atherosclerosis (ICD10-I70.0). Electronically Signed   By: Zetta Bills M.D.   On: 07/16/2020 10:41   CT Abdomen Pelvis W Contrast  Result Date: 07/16/2020 CLINICAL DATA:  Primary Cancer Type: Lung Imaging Indication: Assess response to therapy Interval therapy since last imaging? Yes  Initial Cancer Diagnosis Date: 09/25/2015; Established by: Biopsy-proven Detailed Pathology: Stage IV non-small cell lung cancer, adenocarcinoma. Primary Tumor location:  Right lower lobe. Metastatic bone lesions. Surgeries: No. Chemotherapy: Yes; Ongoing? Yes; Most recent administration: 06/16/2020 Immunotherapy? No Radiation therapy? Yes; Date Range: 04/14/2017 - 04/26/2017; Target: Lumbar spine Other Cancer Therapies: Zometa EXAM: CT CHEST, ABDOMEN, AND PELVIS WITH CONTRAST TECHNIQUE: Multidetector CT imaging of the chest, abdomen and pelvis was performed following the standard protocol during bolus administration of intravenous contrast. CONTRAST:  130m OMNIPAQUE IOHEXOL 300 MG/ML  SOLN COMPARISON:  Most recent CT chest, abdomen and pelvis 05/15/2020. 08/21/2015 PET-CT. FINDINGS: CT CHEST FINDINGS Cardiovascular: Calcified atheromatous plaque in the thoracic aorta. Mild with ascending aortic dilation to 3.7 cm that is unchanged. Central pulmonary vasculature is unremarkable. Limited on venous phase assessment. Heart size stable small amount of pericardial fluid similar to previous imaging RIGHT-sided Port-A-Cath in place terminating at the caval to atrial junction. Mediastinum/Nodes: No thoracic inlet lymphadenopathy. No mediastinal lymphadenopathy. Esophagus mildly patulous. No axillary lymphadenopathy. Lungs/Pleura: Enlargement of moderately large RIGHT pleural effusion, associated with further volume loss in the RIGHT lung base. This is, Mainly sub pulmonic with vague enhancing area in the RIGHT lung base measuring as much as 2.7 x 2.3 cm (image 48, series 2), this appears to represent a new abnormality in this location. Pleural thickening otherwise similar. LEFT-sided effusion increased as well with sub pulmonic fluid in the LEFT lung base. Pulmonary mass at the RIGHT lung base measuring 4.5 x 3.8 cm, difficult to separate from surrounding volume loss previously approximately 3.3 x 3.0 cm. (Image 36, series  4) 9 mm nodule along the major fissure in the LEFT chest within 1 mm of previous measurements. Numerous ill-defined nodules in the LEFT upper lobe with similar appearance, 13 mm nodule on image 65 of series 4 is stable as an example of this process in the upper lobe. LEFT lower lobe pulmonary nodule (image 59, series 4) 19 mm, previously 18 mm. Spiculation along bronchovascular structures in the RIGHT upper lobe (image 46, series 4) 1.6 x 1.1 cm, stable. Further collapse/volume loss in the RIGHT lower lobe. Musculoskeletal: See below for full musculoskeletal details. CT ABDOMEN PELVIS FINDINGS Hepatobiliary: No focal, suspicious hepatic lesion. Cholelithiasis. No pericholecystic stranding. Pancreas: Normal, without mass, inflammation or ductal dilatation. Spleen: Normal appearance of the spleen. Adrenals/Urinary Tract: Adrenal glands are normal. 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 No hydronephrosis. Symmetric renal enhancement accounting for scarring that is present on the LEFT. Urinary bladder under distended, otherwise unremarkable. Stomach/Bowel: No sign of bowel obstruction. 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 Ingested contrast media passing into the colon. Cecum in the pelvis as on the prior study. Vascular/Lymphatic: Calcified and noncalcified atheromatous plaque in the abdominal aorta. No aneurysmal dilation. There is no gastrohepatic or hepatoduodenal ligament lymphadenopathy. No retroperitoneal or mesenteric lymphadenopathy. No pelvic sidewall lymphadenopathy. Reproductive: Unremarkable by CT. Other: No ascites. Musculoskeletal: Spinal  degenerative changes and diffuse sclerotic metastatic disease showing a similar distribution compared to prior imaging. Some increased sclerosis suspected, for instance at T4-T5 the vertebral bodies in general appear more sclerotic. Lesions from T7 through T10 as an example also with some increased  sclerosis since previous imaging. RIGHT iliac lesion on sagittal image 50 showing a halo of increasing sclerosis as compared to previous studies. IMPRESSION: 1. Increasing bilateral pleural effusions now with nodular area along the periphery of RIGHT-sided effusion suspicious for worsening of disease. 2. Enlarging RIGHT lower lobe pulmonary mass, difficult to separate from surrounding volume loss with otherwise stable appearance of bilateral pulmonary nodules. 3. Diffuse skeletal metastatic disease with similar distribution but with increasing sclerosis, findings may reflect sclerosis in the setting of ongoing therapy and treatment response or worsening. Subsequent imaging may be helpful in this regard to assess for further change. 4. Cholelithiasis without evidence of acute cholecystitis. 5. 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 6. Aortic atherosclerosis. Aortic Atherosclerosis (ICD10-I70.0). Electronically Signed   By: Zetta Bills M.D.   On: 07/16/2020 10:41   US Thoracentesis Asp Pleural space w/IMG guide  Result Date: 07/11/2020 INDICATION: Patient with history of stage IV right lung cancer, dyspnea, right pleural effusion. Request received for therapeutic right thoracentesis. EXAM: ULTRASOUND GUIDED THERAPEUTIC RIGHT THORACENTESIS MEDICATIONS: 1% lidocaine to skin and subcutaneous tissue COMPLICATIONS: None immediate. PROCEDURE: An ultrasound guided thoracentesis was thoroughly discussed with the patient and questions answered. The benefits, risks, alternatives and complications were also discussed. The patient understands and wishes to proceed with the procedure. Written consent was obtained. Ultrasound was performed to localize and mark an adequate pocket of fluid in the right chest. The area was then prepped and draped in the normal sterile fashion. 1% Lidocaine was used for local anesthesia. Under ultrasound guidance a 6 Fr Safe-T-Centesis catheter was introduced. Thoracentesis was performed. The  catheter was removed and a dressing applied. FINDINGS: A total of approximately 900 cc of bloody fluid was removed. Due to patient chest discomfort only the above amount of fluid was removed today. IMPRESSION: Successful ultrasound guided therapeutic right thoracentesis yielding 900 cc of pleural fluid. Read by: Rowe Robert, PA-C Electronically Signed   By: Ruthann Cancer MD   On: 07/11/2020 13:03     ASSESSMENT/PLAN:  This is a very pleasant 75 year old Hispanic female diagnosed with metastatic non-small cell lung cancer, adenocarcinoma. She presented with a right lower lobe lung nodule in addition to mediastinal lymphadenopathy and metastatic bone lesions. She was diagnosed in March 2017. Her PDL 1 expression is <1% and she has no actionable mutations.   She previously underwent inductiontreatmentwith systemic chemotherapy withCarboplatin and Alimta. She then was on maintenance therapy withAlimta. She is status post73cycles. The patient has been tolerating this treatment fairly well without any concerning adversesideeffects.  She then showed evidence for disease progression in Feb 2022.   She is currently undergoing second line immunotherapy with nivolumab 480 mg IV every 4 weeks. She is here for her first cycle today.   The patient was seen with Dr. Julien Nordmann. Labs were reviewed. Recommend that she _proceed with cycle #1 today as scheduled. She had a lot of questions regarding her prognosis and going to other institutions for clinical trials or second opinions which Dr. Julien Nordmann answered to her satisfaction. I discussed the adverse side effects of immunotherapy including but not limited to immunotherapy mediated skin rash, diarrhea, inflammation of the lung, kidney, liver, thyroid or other endocrine dysfunction  We will see her back for a follow up  visit in 4 weeks for evaluation before starting cycle #2.   She is still deciding if she would like a referral to cardiothoracic surgery for  consideration of pleurex catheter. She will call us once she decides if she would like to move forward.   She has not picked up Marinol for her appetite yet. Encouraged her to increase her protein/caloric intake.   The patient was advised to call immediately if she has any concerning symptoms in the interval. The patient voices understanding of current disease status and treatment options and is in agreement with the current care plan. All questions were answered. The patient knows to call the clinic with any problems, questions or concerns. We can certainly see the patient much sooner if necessary  No orders of the defined types were placed in this encounter.    I spent 20-29 minutes in this encounter   Giovanny Dugal L Juris Gosnell, PA-C 07/22/20   ADDENDUM: Hematology/Oncology Attending: I had a face-to-face encounter with the patient today.  I reviewed her record and recommended her care plan.  This is a very pleasant 75 years old Hispanic female with a stage IV non-small cell lung cancer diagnosed in March 2017 with no actionable mutation and negative PD-L1 expression.  The patient was treated with induction systemic chemotherapy with carboplatin and Alimta followed by maintenance treatment with Alimta for a total dose of 73 cycles.  The patient had more symptoms with her chemotherapy over the last few months with increasing fatigue and weakness as well as low back pain and reaccumulation of pleural effusion. Her last CT scan of the chest showed evidence for disease progression and I discussed with the patient switching her treatment to second line treatment with immunotherapy with nivolumab 480 mg IV every 4 weeks. The patient is here today to start the first cycle of her treatment but she has a lot of questions about her condition, prognosis and other treatment options. She was also seen in the past at [facility] and the patient is considering seeking a second opinion there  regarding her condition.  I encouraged the patient to proceed with the second opinion for any other treatment options if possible or enrollment in a clinical trial. For now she would like to proceed with the treatment with immunotherapy as planned today. I also had a lengthy discussion with the patient today about the recurrent pleural effusion and consideration of Pleurx catheter placement but she still like to hold on this for now. For the lack of appetite she will continue her current treatment with Marinol. The patient will come back for follow-up visit in 4 weeks for evaluation before starting cycle #2. She was advised to call immediately if she has any other concerning symptoms in the interval.  Disclaimer: This note was dictated with voice recognition software. Similar sounding words can inadvertently be transcribed and may be missed upon review. Eilleen Kempf, MD 07/22/20

## 2020-07-21 NOTE — Progress Notes (Signed)
Pharmacist Chemotherapy Monitoring - Initial Assessment    Anticipated start date: 07/23/2020   Regimen:  . Are orders appropriate based on the patient's diagnosis, regimen, and cycle? Yes . Does the plan date match the patient's scheduled date? Yes . Is the sequencing of drugs appropriate? Yes . Are the premedications appropriate for the patient's regimen? Yes . Prior Authorization for treatment is: Not Started - message sent to [place] team who is working on her authorization  o If applicable, is the correct biosimilar selected based on the patient's insurance? not applicable  Organ Function and Labs: Marland Kitchen Are dose adjustments needed based on the patient's renal function, hepatic function, or hematologic function? No . Are appropriate labs ordered prior to the start of patient's treatment? Yes . Other organ system assessment, if indicated: N/A . The following baseline labs, if indicated, have been ordered: nivolumab: baseline TSH +/- T4  Dose Assessment: . Are the drug doses appropriate? Yes . Are the following correct: o Drug concentrations Yes o IV fluid compatible with drug Yes o Administration routes Yes o Timing of therapy Yes . If applicable, does the patient have documented access for treatment and/or plans for port-a-cath placement? yes . If applicable, have lifetime cumulative doses been properly documented and assessed? not applicable Lifetime Dose Tracking  No doses have been documented on this patient for the following tracked chemicals: Doxorubicin, Epirubicin, Idarubicin, Daunorubicin, Mitoxantrone, Bleomycin, Oxaliplatin, Carboplatin, Liposomal Doxorubicin  o   Toxicity Monitoring/Prevention: . The patient has the following take home antiemetics prescribed: Ondansetron . The patient has the following take home medications prescribed: N/A . Medication allergies and previous infusion related reactions, if applicable, have been reviewed and addressed. Yes . The patient's  current medication list has been assessed for drug-drug interactions with their chemotherapy regimen. no significant drug-drug interactions were identified on review.  Order Review: . Are the treatment plan orders signed? Yes . Is the patient scheduled to see a provider prior to their treatment? Yes  I verify that I have reviewed each item in the above checklist and answered each question accordingly.  Marja Kays, RPH, PharmD, BCPS 07/21/2020  8:45 AM

## 2020-07-22 ENCOUNTER — Inpatient Hospital Stay: Payer: Medicare Other

## 2020-07-22 ENCOUNTER — Inpatient Hospital Stay (HOSPITAL_BASED_OUTPATIENT_CLINIC_OR_DEPARTMENT_OTHER): Payer: Medicare Other | Admitting: Physician Assistant

## 2020-07-22 ENCOUNTER — Other Ambulatory Visit: Payer: Self-pay

## 2020-07-22 VITALS — HR 100

## 2020-07-22 VITALS — BP 132/81 | HR 105 | Temp 98.2°F | Resp 19 | Ht 63.0 in | Wt 135.4 lb

## 2020-07-22 DIAGNOSIS — C3491 Malignant neoplasm of unspecified part of right bronchus or lung: Secondary | ICD-10-CM

## 2020-07-22 DIAGNOSIS — C771 Secondary and unspecified malignant neoplasm of intrathoracic lymph nodes: Secondary | ICD-10-CM | POA: Diagnosis not present

## 2020-07-22 DIAGNOSIS — M8580 Other specified disorders of bone density and structure, unspecified site: Secondary | ICD-10-CM | POA: Diagnosis not present

## 2020-07-22 DIAGNOSIS — C7951 Secondary malignant neoplasm of bone: Secondary | ICD-10-CM | POA: Diagnosis not present

## 2020-07-22 DIAGNOSIS — Z95828 Presence of other vascular implants and grafts: Secondary | ICD-10-CM

## 2020-07-22 DIAGNOSIS — C3431 Malignant neoplasm of lower lobe, right bronchus or lung: Secondary | ICD-10-CM | POA: Diagnosis not present

## 2020-07-22 DIAGNOSIS — Z5112 Encounter for antineoplastic immunotherapy: Secondary | ICD-10-CM

## 2020-07-22 DIAGNOSIS — R5382 Chronic fatigue, unspecified: Secondary | ICD-10-CM

## 2020-07-22 DIAGNOSIS — I10 Essential (primary) hypertension: Secondary | ICD-10-CM | POA: Diagnosis not present

## 2020-07-22 LAB — CBC WITH DIFFERENTIAL (CANCER CENTER ONLY)
Abs Immature Granulocytes: 0.04 10*3/uL (ref 0.00–0.07)
Basophils Absolute: 0 10*3/uL (ref 0.0–0.1)
Basophils Relative: 1 %
Eosinophils Absolute: 0.1 10*3/uL (ref 0.0–0.5)
Eosinophils Relative: 1 %
HCT: 31.7 % — ABNORMAL LOW (ref 36.0–46.0)
Hemoglobin: 10 g/dL — ABNORMAL LOW (ref 12.0–15.0)
Immature Granulocytes: 1 %
Lymphocytes Relative: 16 %
Lymphs Abs: 0.9 10*3/uL (ref 0.7–4.0)
MCH: 29.9 pg (ref 26.0–34.0)
MCHC: 31.5 g/dL (ref 30.0–36.0)
MCV: 94.6 fL (ref 80.0–100.0)
Monocytes Absolute: 0.7 10*3/uL (ref 0.1–1.0)
Monocytes Relative: 12 %
Neutro Abs: 4 10*3/uL (ref 1.7–7.7)
Neutrophils Relative %: 69 %
Platelet Count: 309 10*3/uL (ref 150–400)
RBC: 3.35 MIL/uL — ABNORMAL LOW (ref 3.87–5.11)
RDW: 15.5 % (ref 11.5–15.5)
WBC Count: 5.7 10*3/uL (ref 4.0–10.5)
nRBC: 0 % (ref 0.0–0.2)

## 2020-07-22 LAB — CMP (CANCER CENTER ONLY)
ALT: 12 U/L (ref 0–44)
AST: 27 U/L (ref 15–41)
Albumin: 2.8 g/dL — ABNORMAL LOW (ref 3.5–5.0)
Alkaline Phosphatase: 141 U/L — ABNORMAL HIGH (ref 38–126)
Anion gap: 9 (ref 5–15)
BUN: 14 mg/dL (ref 8–23)
CO2: 26 mmol/L (ref 22–32)
Calcium: 9.6 mg/dL (ref 8.9–10.3)
Chloride: 101 mmol/L (ref 98–111)
Creatinine: 1.12 mg/dL — ABNORMAL HIGH (ref 0.44–1.00)
GFR, Estimated: 52 mL/min — ABNORMAL LOW (ref >60)
Glucose, Bld: 112 mg/dL — ABNORMAL HIGH (ref 70–99)
Potassium: 4 mmol/L (ref 3.5–5.1)
Sodium: 136 mmol/L (ref 135–145)
Total Bilirubin: 0.3 mg/dL (ref 0.3–1.2)
Total Protein: 7.1 g/dL (ref 6.5–8.1)

## 2020-07-22 LAB — TSH: TSH: 2.3 u[IU]/mL (ref 0.308–3.960)

## 2020-07-22 MED ORDER — SODIUM CHLORIDE 0.9 % IV SOLN
Freq: Once | INTRAVENOUS | Status: AC
  Filled 2020-07-22: qty 250

## 2020-07-22 MED ORDER — SODIUM CHLORIDE 0.9% FLUSH
10.0000 mL | Freq: Once | INTRAVENOUS | Status: AC | PRN
  Administered 2020-07-22: 10 mL
  Filled 2020-07-22: qty 10

## 2020-07-22 MED ORDER — SODIUM CHLORIDE 0.9% FLUSH
10.0000 mL | INTRAVENOUS | Status: DC | PRN
  Administered 2020-07-22: 10 mL
  Filled 2020-07-22: qty 10

## 2020-07-22 MED ORDER — SODIUM CHLORIDE 0.9 % IV SOLN
480.0000 mg | Freq: Once | INTRAVENOUS | Status: AC
  Administered 2020-07-22: 480 mg via INTRAVENOUS
  Filled 2020-07-22: qty 48

## 2020-07-22 MED ORDER — HEPARIN SOD (PORK) LOCK FLUSH 100 UNIT/ML IV SOLN
500.0000 [IU] | Freq: Once | INTRAVENOUS | Status: AC | PRN
  Administered 2020-07-22: 500 [IU]
  Filled 2020-07-22: qty 5

## 2020-07-22 NOTE — Patient Instructions (Signed)
[facility] Discharge Instructions for Patients Receiving Chemotherapy  Today you received the following chemotherapy agents Opdivo   To help prevent nausea and vomiting after your treatment, we encourage you to take your nausea medication as directed.   If you develop nausea and vomiting that is not controlled by your nausea medication, call the clinic.   BELOW ARE SYMPTOMS THAT SHOULD BE REPORTED IMMEDIATELY:  *FEVER GREATER THAN 100.5 F  *CHILLS WITH OR WITHOUT FEVER  NAUSEA AND VOMITING THAT IS NOT CONTROLLED WITH YOUR NAUSEA MEDICATION  *UNUSUAL SHORTNESS OF BREATH  *UNUSUAL BRUISING OR BLEEDING  TENDERNESS IN MOUTH AND THROAT WITH OR WITHOUT PRESENCE OF ULCERS  *URINARY PROBLEMS  *BOWEL PROBLEMS  UNUSUAL RASH Items with * indicate a potential emergency and should be followed up as soon as possible.  Feel free to call the clinic should you have any questions or concerns. The clinic phone number is [phone].  Please show the [facility] at check-in to the Emergency Department and triage nurse.  Nivolumab injection What is this medicine? NIVOLUMAB (nye VOL ue mab) is a monoclonal antibody. It treats certain types of cancer. Some of the cancers treated are colon cancer, head and neck cancer, Hodgkin lymphoma, lung cancer, and melanoma. This medicine may be used for other purposes; ask your health care provider or pharmacist if you have questions. COMMON BRAND NAME(S): Opdivo What should I tell my health care provider before I take this medicine? They need to know if you have any of these conditions:  autoimmune diseases like Crohn's disease, ulcerative colitis, or lupus  have had or planning to have an allogeneic stem cell transplant (uses someone else's stem cells)  history of chest radiation  history of organ transplant  nervous system problems like myasthenia gravis or Guillain-Barre syndrome  an unusual or allergic reaction to  nivolumab, other medicines, foods, dyes, or preservatives  pregnant or trying to get pregnant  breast-feeding How should I use this medicine? This medicine is for infusion into a vein. It is given by a health care professional in a hospital or clinic setting. A special MedGuide will be given to you before each treatment. Be sure to read this information carefully each time. Talk to your pediatrician regarding the use of this medicine in children. While this drug may be prescribed for children as young as 12 years for selected conditions, precautions do apply. Overdosage: If you think you have taken too much of this medicine contact a poison control center or emergency room at once. NOTE: This medicine is only for you. Do not share this medicine with others. What if I miss a dose? It is important not to miss your dose. Call your doctor or health care professional if you are unable to keep an appointment. What may interact with this medicine? Interactions have not been studied. This list may not describe all possible interactions. Give your health care provider a list of all the medicines, herbs, non-prescription drugs, or dietary supplements you use. Also tell them if you smoke, drink alcohol, or use illegal drugs. Some items may interact with your medicine. What should I watch for while using this medicine? This drug may make you feel generally unwell. Continue your course of treatment even though you feel ill unless your doctor tells you to stop. You may need blood work done while you are taking this medicine. Do not become pregnant while taking this medicine or for 5 months after stopping it. Women should inform  their doctor if they wish to become pregnant or think they might be pregnant. There is a potential for serious side effects to an unborn child. Talk to your health care professional or pharmacist for more information. Do not breast-feed an infant while taking this medicine or for 5  months after stopping it. What side effects may I notice from receiving this medicine? Side effects that you should report to your doctor or health care professional as soon as possible:  allergic reactions like skin rash, itching or hives, swelling of the face, lips, or tongue  breathing problems  blood in the urine  bloody or watery diarrhea or black, tarry stools  changes in emotions or moods  changes in vision  chest pain  cough  dizziness  feeling faint or lightheaded, falls  fever, chills  headache with fever, neck stiffness, confusion, loss of memory, sensitivity to light, hallucination, loss of contact with reality, or seizures  joint pain  mouth sores  redness, blistering, peeling or loosening of the skin, including inside the mouth  severe muscle pain or weakness  signs and symptoms of high blood sugar such as dizziness; dry mouth; dry skin; fruity breath; nausea; stomach pain; increased hunger or thirst; increased urination  signs and symptoms of kidney injury like trouble passing urine or change in the amount of urine  signs and symptoms of liver injury like dark yellow or brown urine; general ill feeling or flu-like symptoms; light-colored stools; loss of appetite; nausea; right upper belly pain; unusually weak or tired; yellowing of the eyes or skin  swelling of the ankles, feet, hands  trouble passing urine or change in the amount of urine  unusually weak or tired  weight gain or loss Side effects that usually do not require medical attention (report to your doctor or health care professional if they continue or are bothersome):  bone pain  constipation  decreased appetite  diarrhea  muscle pain  nausea, vomiting  tiredness This list may not describe all possible side effects. Call your doctor for medical advice about side effects. You may report side effects to FDA at 1-800-FDA-1088. Where should I keep my medicine? This drug is given  in a hospital or clinic and will not be stored at home. NOTE: This sheet is a summary. It may not cover all possible information. If you have questions about this medicine, talk to your doctor, pharmacist, or health care provider.  2021 Elsevier/Gold Standard (2019-09-26 10:08:25)

## 2020-07-23 ENCOUNTER — Telehealth: Payer: Self-pay | Admitting: Physician Assistant

## 2020-07-23 NOTE — Telephone Encounter (Signed)
Scheduled appointments per 2/15 los. Will have updated calendar printed for patient at next visit.

## 2020-07-28 ENCOUNTER — Ambulatory Visit: Payer: Medicare Other | Admitting: Internal Medicine

## 2020-07-28 ENCOUNTER — Ambulatory Visit: Payer: Medicare Other

## 2020-07-28 ENCOUNTER — Other Ambulatory Visit: Payer: Medicare Other

## 2020-07-28 ENCOUNTER — Encounter: Payer: Medicare Other | Admitting: Nutrition

## 2020-07-29 ENCOUNTER — Other Ambulatory Visit: Payer: Self-pay | Admitting: Medical Oncology

## 2020-07-29 ENCOUNTER — Telehealth: Payer: Self-pay | Admitting: Medical Oncology

## 2020-07-29 DIAGNOSIS — J90 Pleural effusion, not elsewhere classified: Secondary | ICD-10-CM

## 2020-07-29 DIAGNOSIS — C3491 Malignant neoplasm of unspecified part of right bronchus or lung: Secondary | ICD-10-CM

## 2020-07-29 NOTE — Telephone Encounter (Signed)
Pt wants a pleurix catheter.She cannot sleep due to breathing. Per [facility] surgery referral made. Pt notified.

## 2020-08-04 ENCOUNTER — Other Ambulatory Visit: Payer: Self-pay

## 2020-08-04 ENCOUNTER — Emergency Department (HOSPITAL_COMMUNITY): Payer: Medicare Other

## 2020-08-04 ENCOUNTER — Other Ambulatory Visit: Payer: Self-pay | Admitting: Thoracic Surgery (Cardiothoracic Vascular Surgery)

## 2020-08-04 ENCOUNTER — Encounter (HOSPITAL_COMMUNITY): Payer: Self-pay

## 2020-08-04 ENCOUNTER — Telehealth: Payer: Self-pay | Admitting: Medical Oncology

## 2020-08-04 ENCOUNTER — Telehealth: Payer: Self-pay

## 2020-08-04 ENCOUNTER — Emergency Department (HOSPITAL_COMMUNITY)
Admission: EM | Admit: 2020-08-04 | Discharge: 2020-08-04 | Disposition: A | Discharge status: Home / Self Care | Payer: Medicare Other | Attending: Emergency Medicine | Admitting: Emergency Medicine

## 2020-08-04 DIAGNOSIS — J90 Pleural effusion, not elsewhere classified: Secondary | ICD-10-CM | POA: Insufficient documentation

## 2020-08-04 DIAGNOSIS — Z85118 Personal history of other malignant neoplasm of bronchus and lung: Secondary | ICD-10-CM | POA: Diagnosis not present

## 2020-08-04 DIAGNOSIS — Z79899 Other long term (current) drug therapy: Secondary | ICD-10-CM | POA: Diagnosis not present

## 2020-08-04 DIAGNOSIS — Z48813 Encounter for surgical aftercare following surgery on the respiratory system: Secondary | ICD-10-CM | POA: Diagnosis not present

## 2020-08-04 DIAGNOSIS — I10 Essential (primary) hypertension: Secondary | ICD-10-CM | POA: Insufficient documentation

## 2020-08-04 DIAGNOSIS — J910 Malignant pleural effusion: Secondary | ICD-10-CM | POA: Diagnosis not present

## 2020-08-04 DIAGNOSIS — R000 Tachycardia, unspecified: Secondary | ICD-10-CM | POA: Insufficient documentation

## 2020-08-04 DIAGNOSIS — R0602 Shortness of breath: Secondary | ICD-10-CM | POA: Diagnosis not present

## 2020-08-04 DIAGNOSIS — Z9889 Other specified postprocedural states: Secondary | ICD-10-CM

## 2020-08-04 DIAGNOSIS — Z85830 Personal history of malignant neoplasm of bone: Secondary | ICD-10-CM | POA: Insufficient documentation

## 2020-08-04 DIAGNOSIS — R059 Cough, unspecified: Secondary | ICD-10-CM | POA: Diagnosis not present

## 2020-08-04 LAB — CBC WITH DIFFERENTIAL/PLATELET
Abs Immature Granulocytes: 0.01 10*3/uL (ref 0.00–0.07)
Basophils Absolute: 0 10*3/uL (ref 0.0–0.1)
Basophils Relative: 0 %
Eosinophils Absolute: 0 10*3/uL (ref 0.0–0.5)
Eosinophils Relative: 1 %
HCT: 33 % — ABNORMAL LOW (ref 36.0–46.0)
Hemoglobin: 10.4 g/dL — ABNORMAL LOW (ref 12.0–15.0)
Immature Granulocytes: 0 %
Lymphocytes Relative: 11 %
Lymphs Abs: 0.6 10*3/uL — ABNORMAL LOW (ref 0.7–4.0)
MCH: 29.1 pg (ref 26.0–34.0)
MCHC: 31.5 g/dL (ref 30.0–36.0)
MCV: 92.4 fL (ref 80.0–100.0)
Monocytes Absolute: 0.6 10*3/uL (ref 0.1–1.0)
Monocytes Relative: 10 %
Neutro Abs: 4.6 10*3/uL (ref 1.7–7.7)
Neutrophils Relative %: 78 %
Platelets: 318 10*3/uL (ref 150–400)
RBC: 3.57 MIL/uL — ABNORMAL LOW (ref 3.87–5.11)
RDW: 14.6 % (ref 11.5–15.5)
WBC: 5.9 10*3/uL (ref 4.0–10.5)
nRBC: 0 % (ref 0.0–0.2)

## 2020-08-04 LAB — BASIC METABOLIC PANEL
Anion gap: 10 (ref 5–15)
BUN: 10 mg/dL (ref 8–23)
CO2: 26 mmol/L (ref 22–32)
Calcium: 9.5 mg/dL (ref 8.9–10.3)
Chloride: 92 mmol/L — ABNORMAL LOW (ref 98–111)
Creatinine, Ser: 0.8 mg/dL (ref 0.44–1.00)
GFR, Estimated: >60 mL/min (ref >60)
Glucose, Bld: 116 mg/dL — ABNORMAL HIGH (ref 70–99)
Potassium: 4.7 mmol/L (ref 3.5–5.1)
Sodium: 128 mmol/L — ABNORMAL LOW (ref 135–145)

## 2020-08-04 MED ORDER — ACETAMINOPHEN 325 MG PO TABS
650.0000 mg | ORAL_TABLET | Freq: Once | ORAL | Status: AC
  Administered 2020-08-04: 650 mg via ORAL
  Filled 2020-08-04: qty 2

## 2020-08-04 MED ORDER — ALBUTEROL SULFATE HFA 108 (90 BASE) MCG/ACT IN AERS: 2.0000 | INHALATION_SPRAY | RESPIRATORY_TRACT | Status: DC | PRN

## 2020-08-04 NOTE — Telephone Encounter (Signed)
Patient contacted the office requesting call back about moving up her appointment that is currently scheduled for tomorrow with Dr. Roxan Hockey.  Patient called and left voicemail for return call.

## 2020-08-04 NOTE — ED Provider Notes (Signed)
[facility] DEPT Provider Note   CSN: [id number] Arrival date & time: 08/04/20  1546     History Chief Complaint  Patient presents with  . Shortness of Breath    Valerie Long is a 75 y.o. female.  HPI   Patient presents to the ED with complaints of worsening shortness of breath.  Patient has a history of stage IV lung cancer.  Patient is undergoing second line treatment with immunotherapy.  Patient is also getting Zometa.  Patient's last infusion was on February 15.  Patient has had issues with recurrent pleural effusions.  She had ultrasound-guided thoracentesis performed on February 4.  Patient states she has had worsening issues of shortness of breath.  Several days ago the patient called the oncology office because of her worsening shortness of breath.  She cannot lie flat without being very short of breath.  Patient was referred to cardiothoracic surgery.  Patient is scheduled to see the thoracic surgeon where she does not have an appointment until tomorrow when she is not scheduled for the procedure.  She came to the ED because she does not feel like she can wait any longer.  Past Medical History:  Diagnosis Date  . 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 Anemia   . Arthritis   . Bone metastases ([place]) 02/24/2017  . Encounter for antineoplastic chemotherapy 03/17/2016  . Hypercholesterolemia   . Hypertension 06/18/2016  . Osteopenia   . 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 Pneumonia   . Shortness of breath dyspnea     Patient Active Problem List   Diagnosis Date Noted  . Encounter for antineoplastic immunotherapy 07/16/2020  . Febrile neutropenia ([facility]) 05/07/2020  . Pleural effusion on right 12/06/2019  . Port-A-Cath in place 01/12/2018  . Bone metastases ([facility]) 02/24/2017  . 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 Hypertension 06/18/2016  . Encounter for antineoplastic chemotherapy 03/17/2016   . Adenocarcinoma of right lung, stage 4 ([facility])   . PCP NOTES >>> 03/22/2015  . CTS (carpal tunnel syndrome) 12/06/2013  . Annual physical exam 08/29/2012  . Pain in joint, shoulder region 08/29/2012  . Vaginal atrophy 06/17/2011  . Osteopenia   . Hypercholesterolemia     Past Surgical History:  Procedure Laterality Date  . CESAREAN SECTION     myomectomy  . COLONOSCOPY W/ POLYPECTOMY    . IR GENERIC HISTORICAL  08/17/2016   IR US GUIDE VASC ACCESS RIGHT 08/17/2016 [name], MD [facility]  . IR GENERIC HISTORICAL  08/17/2016   IR FLUORO GUIDE PORT INSERTION RIGHT 08/17/2016 [name], MD [facility]  . MEDIASTINOSCOPY N/A 09/25/2015   Procedure: MEDIASTINOSCOPY;  Surgeon: Melrose Nakayama, MD;  Location: [facility];  Service: Thoracic;  Laterality: N/A;  . VIDEO BRONCHOSCOPY Bilateral 08/19/2015   Procedure: VIDEO BRONCHOSCOPY WITH FLUORO;  Surgeon: Rigoberto Noel, MD;  Location: [facility];  Service: Cardiopulmonary;  Laterality: Bilateral;  . VIDEO BRONCHOSCOPY N/A 09/08/2015   Procedure: VIDEO BRONCHOSCOPY WITH FLUORO;  Surgeon: Rigoberto Noel, MD;  Location: [facility];  Service: Thoracic;  Laterality: N/A;  . VIDEO BRONCHOSCOPY WITH ENDOBRONCHIAL ULTRASOUND Right 09/08/2015   Procedure: ATTEMPTED VIDEO BRONCHOSCOPY ENDOBRONCHIAL ULTRASOUND  ;  Surgeon: Rigoberto Noel, MD;  Location: [facility];  Service: Thoracic;  Laterality: Right;     OB History    Gravida  2   Para  1   Term      Preterm  AB  1   Living  1     SAB  1   IAB      Ectopic      Multiple      Live Births              Family History  Problem Relation Age of Onset  . Hypertension Mother        F and M  . CAD Father   . Diabetes Other        auncle-aunts   . Cancer Other        UTERINE   . Breast cancer Sister   . Stroke Neg Hx   . Colon cancer Neg Hx     Social History   Tobacco Use  . Smoking status: Never Smoker  . Smokeless tobacco: Never Used  Vaping Use  . Vaping  Use: Never used  Substance Use Topics  . Alcohol use: Not Currently    Alcohol/week: 0.0 standard drinks  . Drug use: No    Home Medications Prior to Admission medications   Medication Sig Start Date End Date Taking? Authorizing Provider  acetaminophen (TYLENOL) 500 MG tablet Take 1,000 mg by mouth every 6 (six) hours as needed.    [provider]  budesonide-formoterol (SYMBICORT) 80-4.5 MCG/ACT inhaler Inhale 2 puffs into the lungs 2 (two) times daily.    [provider]  Cholecalciferol (VITAMIN D3 PO) Take 2 tablets by mouth daily.    [provider]  dextromethorphan-guaiFENesin (MUCINEX DM) 30-600 MG 12hr tablet Take 1 tablet by mouth 2 (two) times daily.    [provider]  docusate sodium (COLACE) 100 MG capsule take Patient not taking: No sig reported 07/07/11   [provider]  dronabinol (MARINOL) 2.5 MG capsule Take 1 capsule (2.5 mg total) by mouth 2 (two) times daily before a meal. 07/16/20   Curt Bears, MD  Ferrous Sulfate (IRON PO) Take 1 tablet by mouth daily.    [provider]  folic acid (FOLVITE) 1 MG tablet Take 1 tablet (1 mg total) by mouth daily. 05/22/20   Curt Bears, MD  gabapentin (NEURONTIN) 300 MG capsule Take 200 mg by mouth 3 (three) times daily.    [provider]  lidocaine-prilocaine (EMLA) cream Apply 1 application topically as needed.    [provider]  Omega-3 1000 MG CAPS     [provider]  ondansetron (ZOFRAN) 8 MG tablet Take 8 mg by mouth every 8 (eight) hours as needed for nausea or vomiting.    [provider]    Allergies    Patient has no known allergies.  Review of Systems   Review of Systems  All other systems reviewed and are negative.   Physical Exam Updated Vital Signs BP (!) 147/89 (BP Location: Left Arm)   Pulse (!) 118   Temp 98.7 F (37.1 C) (Oral)   Resp 18   Ht 1.626 m (5\' 4" )   Wt 59 kg   LMP 06/09/1998   SpO2 98%    BMI 22.31 kg/m   Physical Exam Vitals and nursing note reviewed.  Constitutional:      Appearance: She is well-developed and well-nourished. She is ill-appearing.  HENT:     Head: Normocephalic and atraumatic.     Right Ear: External ear normal.     Left Ear: External ear normal.  Eyes:     General: No scleral icterus.       Right eye: No  discharge.        Left eye: No discharge.     Conjunctiva/sclera: Conjunctivae normal.  Neck:     Trachea: No tracheal deviation.  Cardiovascular:     Rate and Rhythm: Normal rate and regular rhythm.     Pulses: Intact distal pulses.  Pulmonary:     Effort: Pulmonary effort is normal. Tachypnea present. No respiratory distress.     Breath sounds: No stridor. Examination of the right-middle field reveals decreased breath sounds. Examination of the right-lower field reveals decreased breath sounds. Decreased breath sounds present. No wheezing or rales.  Abdominal:     General: Bowel sounds are normal. There is no distension.     Palpations: Abdomen is soft.     Tenderness: There is no abdominal tenderness. There is no guarding or rebound.  Musculoskeletal:        General: No tenderness or edema.     Cervical back: Neck supple.  Skin:    General: Skin is warm and dry.     Findings: No rash.  Neurological:     Mental Status: She is alert.     Cranial Nerves: No cranial nerve deficit (no facial droop, extraocular movements intact, no slurred speech).     Sensory: No sensory deficit.     Motor: No abnormal muscle tone or seizure activity.     Coordination: Coordination normal.     Deep Tendon Reflexes: Strength normal.  Psychiatric:        Mood and Affect: Mood and affect normal.     ED Results / Procedures / Treatments   Labs (all labs ordered are listed, but only abnormal results are displayed) Labs Reviewed  CBC WITH DIFFERENTIAL/PLATELET - Abnormal; Notable for the following components:      Result Value   RBC 3.57 (*)     Hemoglobin 10.4 (*)    HCT 33.0 (*)    Lymphs Abs 0.6 (*)    All other components within normal limits  BASIC METABOLIC PANEL - Abnormal; Notable for the following components:   Sodium 128 (*)    Chloride 92 (*)    Glucose, Bld 116 (*)    All other components within normal limits    EKG EKG Interpretation  Date/Time:  Monday August 04 2020 16:02:08 EST Ventricular Rate:  116 PR Interval:    QRS Duration: 80 QT Interval:  293 QTC Calculation: 407 R Axis:   104 Text Interpretation: Sinus tachycardia Consider right atrial enlargement Right axis deviation Low voltage, precordial leads Probable anteroseptal infarct, old 12 Lead; Mason-Likar No significant change since last tracing Confirmed by Dorie Rank [phone]) on 08/04/2020 5:25:33 PM   Radiology DG Chest 2 View  Result Date: 08/04/2020 CLINICAL DATA:  Status post thoracentesis. EXAM: CHEST - 2 VIEW COMPARISON:  Two-view chest earlier today FINDINGS: Patient has RIGHT-sided PowerPort, tip overlying the superior vena cava. Cardiac margins are partially obscured. RIGHT pleural effusion is smaller compared to prior study. There are patchy lung opacities bilaterally, more confluent at the RIGHT lung base. There is no pneumothorax following thoracentesis. IMPRESSION: Smaller RIGHT pleural effusion. No pneumothorax. Electronically Signed   By: Nolon Nations M.D.   On: 08/04/2020 19:05   DG Chest 2 View  Result Date: 08/04/2020 CLINICAL DATA:  Shortness of breath, cough EXAM: CHEST - 2 VIEW COMPARISON:  07/11/2020 FINDINGS: Right Port-A-Cath in place with the tip at the cavoatrial junction, unchanged. Moderate to large right pleural effusion. Small left pleural effusion. Diffuse airspace disease throughout the  right lung. Previously seen right lung base mass not well visualized by plain film. Patchy opacities in the mid and lower left lung. IMPRESSION: Moderate to large right effusion and small left effusion. Diffuse right lung airspace  disease and patchy opacities throughout the left lung. Electronically Signed   By: Rolm Baptise M.D.   On: 08/04/2020 16:37    Procedures THORACENTESIS BEDSIDE  Date/Time: 08/04/2020 6:17 PM Performed by: Dorie Rank, MD Authorized by: Dorie Rank, MD   Consent:    Consent obtained:  Verbal   Consent given by:  Patient   Risks, benefits, and alternatives were discussed: yes     Risks discussed:  Bleeding, pneumothorax and pain   Alternatives discussed:  Delayed treatment Universal protocol:    Patient identity confirmed:  Verbally with patient Sedation:    Sedation type:  None Anesthesia:    Anesthesia method:  Local infiltration   Local anesthetic:  Lidocaine 1% w/o epi Procedure details:    Preparation: Patient was prepped and draped in usual sterile fashion     Patient position:  Sitting   Location:  R midscapular line   Intercostal space:  6th   Puncture method:  Over-the-needle catheter   Ultrasound guidance: yes     Indwelling catheter placed: no     Number of attempts:  1   Drainage characteristics:  Bloody Post-procedure details:    Chest x-ray performed: yes     Chest x-ray findings:  Pleural effusion improved   Procedure completion:  Tolerated well, no immediate complications Comments:     600 cc of fluid obtained, no ptx on cxr     Medications Ordered in ED Medications  acetaminophen (TYLENOL) tablet 650 mg (has no administration in time range)    ED Course  I have reviewed the triage vital signs and the nursing notes.  Pertinent labs & imaging results that were available during my care of the patient were reviewed by me and considered in my medical decision making (see chart for details).  Clinical Course as of 08/04/20 Mora Bellman Aug 04, 2020  1700 Discussed with CT surgery.  Will review her case and call me back  [JK]  1921 No pTX post procedure [JK]    Clinical Course User Index [JK] Dorie Rank, MD   MDM Rules/Calculators/A&P                           Patient presented to the ED with worsening recurrent malignant pleural effusion.  Patient is scheduled to see cardiothoracic surgery tomorrow but was not on the OR schedule yet.  Discussed the case with cardiothoracic surgery and they requested thoracentesis this evening.  Patient will likely have a Pleurx catheter placed this week.  I proceeded with thoracentesis in the ED with ultrasound guidance.  Patient tolerated this procedure well.  She is feeling better.  Will discharge home with follow-up as planned Final Clinical Impression(s) / ED Diagnoses Final diagnoses:  Pleural effusion, malignant  S/P thoracentesis    Rx / DC Orders ED Discharge Orders    None       Dorie Rank, MD 08/04/20 [phone]

## 2020-08-04 NOTE — ED Triage Notes (Signed)
Pt c/o sob x 1 week, hx of lung ca- currently getting chemo (last treatment 2 wks ago). Pt states she has appt for chest tube, but unsure of when they are going to do it and cannot wait anymore. Pt on room air

## 2020-08-04 NOTE — Discharge Instructions (Addendum)
The x-ray today did not show any complication from the thoracentesis procedure.  Hopefully this will help you with your breathing.  Follow-up with the cardiothoracic surgeon tomorrow as planned.

## 2020-08-04 NOTE — Telephone Encounter (Signed)
Struggling with breathing- 'I feel like I am suffocating. Will the procedure be done tomorrow"?  appt with [facility]  tomorrow.  I told her per his staff it will not be tomorrow because it has to be done in the OR .  I suggested pt go to ED for her symptom.  "I will think about it".

## 2020-08-05 ENCOUNTER — Encounter: Payer: Self-pay | Admitting: Thoracic Surgery (Cardiothoracic Vascular Surgery)

## 2020-08-05 ENCOUNTER — Other Ambulatory Visit: Payer: Self-pay | Admitting: *Deleted

## 2020-08-05 ENCOUNTER — Institutional Professional Consult (permissible substitution) (INDEPENDENT_AMBULATORY_CARE_PROVIDER_SITE_OTHER): Payer: Medicare Other | Admitting: Thoracic Surgery (Cardiothoracic Vascular Surgery)

## 2020-08-05 VITALS — BP 149/89 | HR 102 | Temp 97.9°F | Resp 20 | Ht 64.0 in | Wt 131.6 lb

## 2020-08-05 DIAGNOSIS — J90 Pleural effusion, not elsewhere classified: Secondary | ICD-10-CM

## 2020-08-05 NOTE — H&P (View-Only) (Signed)
PCP is Colon Branch, MD Referring Provider is Heilingoetter, Architectural technologist*  Chief Complaint  Patient presents with  . Pleural Effusion    Surgical consult     HPI: [name] is sent for consultation regarding a malignant right pleural effusion.  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  On her initial presentation she had a right lung nodule with mediastinal adenopathy and metastatic bone lesions.  She subsequently has developed a malignant right pleural effusion.  Her past medical history is also significant for hypertension, hyperlipidemia, osteopenia, anemia, and arthritis.  She first had a thoracentesis last summer.  She had a good result from that.  She developed shortness of breath and required a thoracentesis about a month ago.  About 800 mL was drained but less than half the effusion was drained.  She had some symptomatic relief.  Yesterday she presented to the emergency room short of breath and her effusion was larger.  She had about 600 mL drained off yesterday but the procedure was stopped due to discomfort.  That again has provided some symptomatic relief but she still is short of breath.  Zubrod Score: At the time of surgery this patient's most appropriate activity status/level should be described as: []     0    Normal activity, no symptoms []     1    Restricted in physical strenuous activity but ambulatory, able to do out light work [x]     2    Ambulatory and capable of self care, unable to do work activities, up and about >50 % of waking hours                              []     3    Only limited self care, in bed greater than 50% of waking hours []     4    Completely disabled, no self care, confined to bed or chair []     5    Moribund  Past Medical History:  Diagnosis Date  . Adenocarcinoma of right lung, stage 4 ([place]) 2017  . Anemia   . Arthritis   . Bone metastases ([place]) 02/24/2017  . Encounter for antineoplastic  chemotherapy 03/17/2016  . Hypercholesterolemia   . Hypertension 06/18/2016  . Osteopenia   . 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 Pneumonia   . Shortness of breath dyspnea     Past Surgical History:  Procedure Laterality Date  . CESAREAN SECTION     myomectomy  . COLONOSCOPY W/ POLYPECTOMY    . IR GENERIC HISTORICAL  08/17/2016   IR US GUIDE VASC ACCESS RIGHT 08/17/2016 [name], MD [facility]  . IR GENERIC HISTORICAL  08/17/2016   IR FLUORO GUIDE PORT INSERTION RIGHT 08/17/2016 [name], MD [facility]  . MEDIASTINOSCOPY N/A 09/25/2015   Procedure: MEDIASTINOSCOPY;  Surgeon: Melrose Nakayama, MD;  Location: [place];  Service: Thoracic;  Laterality: N/A;  . VIDEO BRONCHOSCOPY Bilateral 08/19/2015   Procedure: VIDEO BRONCHOSCOPY WITH FLUORO;  Surgeon: Rigoberto Noel, MD;  Location: [place];  Service: Cardiopulmonary;  Laterality: Bilateral;  . VIDEO BRONCHOSCOPY N/A 09/08/2015   Procedure: VIDEO BRONCHOSCOPY WITH FLUORO;  Surgeon: Rigoberto Noel, MD;  Location: [place];  Service: Thoracic;  Laterality: N/A;  . VIDEO BRONCHOSCOPY WITH ENDOBRONCHIAL ULTRASOUND Right 09/08/2015   Procedure: ATTEMPTED VIDEO BRONCHOSCOPY ENDOBRONCHIAL ULTRASOUND  ;  Surgeon: Rigoberto Noel, MD;  Location: [facility] OR;  Service: Thoracic;  Laterality: Right;    Family History  Problem Relation Age of Onset  . Hypertension Mother        F and M  . CAD Father   . Diabetes Other        auncle-aunts   . Cancer Other        UTERINE   . Breast cancer Sister   . Stroke Neg Hx   . Colon cancer Neg Hx     Social History Social History   Tobacco Use  . Smoking status: Never Smoker  . Smokeless tobacco: Never Used  Vaping Use  . Vaping Use: Never used  Substance Use Topics  . Alcohol use: Not Currently    Alcohol/week: 0.0 standard drinks  . Drug use: No    Current Outpatient Medications  Medication Sig Dispense Refill  . acetaminophen (TYLENOL) 500 MG tablet Take  1,000 mg by mouth every 6 (six) hours as needed.    . Cholecalciferol (VITAMIN D3 PO) Take 2 tablets by mouth daily.    Marland Kitchen docusate sodium (COLACE) 100 MG capsule take    . Ferrous Sulfate (IRON PO) Take 1 tablet by mouth daily.    . folic acid (FOLVITE) 1 MG tablet Take 1 tablet (1 mg total) by mouth daily. 90 tablet 0  . lidocaine-prilocaine (EMLA) cream Apply 1 application topically as needed.    . Omega-3 1000 MG CAPS     . dronabinol (MARINOL) 2.5 MG capsule Take 1 capsule (2.5 mg total) by mouth 2 (two) times daily before a meal. (Patient not taking: Reported on 08/05/2020) 60 capsule 0  . gabapentin (NEURONTIN) 300 MG capsule Take 200 mg by mouth 3 (three) times daily. (Patient not taking: Reported on 08/05/2020)    . ondansetron (ZOFRAN) 8 MG tablet Take 8 mg by mouth every 8 (eight) hours as needed for nausea or vomiting. (Patient not taking: Reported on 08/05/2020)     No current facility-administered medications for this visit.    No Known Allergies  Review of Systems  Constitutional: Positive for activity change.  Respiratory: Positive for cough and shortness of breath.   Cardiovascular: Positive for leg swelling. Negative for chest pain.  Gastrointestinal: Negative for abdominal pain.  Musculoskeletal: Positive for arthralgias.  All other systems reviewed and are negative.   BP (!) 149/89 (BP Location: Left Arm, Patient Position: Sitting, Cuff Size: Normal)   Pulse (!) 102   Temp 97.9 F (36.6 C) (Skin)   Resp 20   Ht 5\' 4"  (1.626 m)   Wt 131 lb 9.6 oz (59.7 kg)   LMP 06/09/1998   SpO2 96% Comment: RA  BMI 22.59 kg/m  Physical Exam Constitutional:      General: She is not in acute distress.    Appearance: Normal appearance.  HENT:     Head: Normocephalic and atraumatic.  Eyes:     General: No scleral icterus.    Extraocular Movements: Extraocular movements intact.  Cardiovascular:     Rate and Rhythm: Normal rate and regular rhythm.     Heart sounds: Normal heart  sounds. No murmur heard.   Pulmonary:     Effort: No respiratory distress.     Comments: Increased work of breathing.  Absent breath sounds right posterior lower two thirds Abdominal:     General: There is no distension.     Palpations: Abdomen is soft.     Tenderness: There is no  abdominal tenderness.  Musculoskeletal:     Cervical back: Neck supple.     Right lower leg: Edema present.     Left lower leg: Edema present.  Lymphadenopathy:     Cervical: No cervical adenopathy.  Skin:    General: Skin is warm and dry.  Neurological:     General: No focal deficit present.     Mental Status: She is alert and oriented to person, place, and time.     Cranial Nerves: No cranial nerve deficit.     Motor: No weakness.     Gait: Gait normal.    Diagnostic Tests: CT chest 07/15/2020 IMPRESSION: 1. Increasing bilateral pleural effusions now with nodular area along the periphery of RIGHT-sided effusion suspicious for worsening of disease. 2. Enlarging RIGHT lower lobe pulmonary mass, difficult to separate from surrounding volume loss with otherwise stable appearance of bilateral pulmonary nodules. 3. Diffuse skeletal metastatic disease with similar distribution but with increasing sclerosis, findings may reflect sclerosis in the setting of ongoing therapy and treatment response or worsening. Subsequent imaging may be helpful in this regard to assess for further change. 4. Cholelithiasis without evidence of acute cholecystitis. 5. 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 6. Aortic atherosclerosis.  Aortic Atherosclerosis (ICD10-I70.0).   Electronically Signed   By: Zetta Bills M.D.   On: 07/16/2020 10:41 CHEST - 2 VIEW  COMPARISON:  Two-view chest earlier today  FINDINGS: Patient has RIGHT-sided PowerPort, tip overlying the superior vena cava. Cardiac margins are partially obscured. RIGHT pleural effusion is smaller compared to prior study. There are patchy lung  opacities bilaterally, more confluent at the RIGHT lung base. There is no pneumothorax following thoracentesis.  IMPRESSION: Smaller RIGHT pleural effusion. No pneumothorax.   Electronically Signed   By: Nolon Nations M.D.   On: 08/04/2020 19:05 I personally reviewed the CT and chest x-ray images.  She has a relatively large right pleural effusion.  Impression: Valerie Long is a 75 year old woman with a past medical history of hypertension, hyperlipidemia, osteopenia, anemia, arthritis, and stage IV lung cancer.  She has malignant right pleural effusion.  She has had the effusion drained twice within the past month.  Both times it was limited due to discomfort.  She still had a large residual effusion each time.  She did have some symptomatic relief although yesterday was relatively minimal as only a small amount of fluid was removed.  We discussed potential options including continued intermittent thoracentesis.  I think that is a poor option.  The other options include a VATS for drainage of the effusion and talc pleurodesis versus a pleural catheter placement.  I think given her general overall condition and her desire to not disrupt her treatment of pleural catheter is that her best option.  I described the proposed procedure of right pleural catheter placement to her and her brother.  This would be done in the operating room with intravenous sedation and local anesthesia.  She would have an indwelling catheter afterwards.  She understands that we will need ongoing care.  I informed her of the indications, risks, benefits, and alternatives.  She understands the risks include, but not limited to bleeding, infection, catheter malposition, catheter occlusion, as well as possibility of other unforeseeable complications.  She accepts the risk and agrees to proceed  Plan: Right pleural catheter placement on Friday, 08/08/2020  Melrose Nakayama, MD [facility] [phone]

## 2020-08-05 NOTE — Progress Notes (Signed)
PCP is Colon Branch, MD Referring Provider is Heilingoetter, Architectural technologist*  Chief Complaint  Patient presents with  . Pleural Effusion    Surgical consult     HPI: Valerie Long is sent for consultation regarding a malignant right pleural effusion.  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  On her initial presentation she had a right lung nodule with mediastinal adenopathy and metastatic bone lesions.  She subsequently has developed a malignant right pleural effusion.  Her past medical history is also significant for hypertension, hyperlipidemia, osteopenia, anemia, and arthritis.  She first had a thoracentesis last summer.  She had a good result from that.  She developed shortness of breath and required a thoracentesis about a month ago.  About 800 mL was drained but less than half the effusion was drained.  She had some symptomatic relief.  Yesterday she presented to the emergency room short of breath and her effusion was larger.  She had about 600 mL drained off yesterday but the procedure was stopped due to discomfort.  That again has provided some symptomatic relief but she still is short of breath.  Zubrod Score: At the time of surgery this patient's most appropriate activity status/level should be described as: []     0    Normal activity, no symptoms []     1    Restricted in physical strenuous activity but ambulatory, able to do out light work [x]     2    Ambulatory and capable of self care, unable to do work activities, up and about >50 % of waking hours                              []     3    Only limited self care, in bed greater than 50% of waking hours []     4    Completely disabled, no self care, confined to bed or chair []     5    Moribund  Past Medical History:  Diagnosis Date  . Adenocarcinoma of right lung, stage 4 ([place]) 2017  . Anemia   . Arthritis   . Bone metastases ([place]) 02/24/2017  . Encounter for antineoplastic  chemotherapy 03/17/2016  . Hypercholesterolemia   . Hypertension 06/18/2016  . Osteopenia   . 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 Pneumonia   . Shortness of breath dyspnea     Past Surgical History:  Procedure Laterality Date  . CESAREAN SECTION     myomectomy  . COLONOSCOPY W/ POLYPECTOMY    . IR GENERIC HISTORICAL  08/17/2016   IR US GUIDE VASC ACCESS RIGHT 08/17/2016 Valerie Cadet, MD [facility]  . IR GENERIC HISTORICAL  08/17/2016   IR FLUORO GUIDE PORT INSERTION RIGHT 08/17/2016 Valerie Cadet, MD [facility]  . MEDIASTINOSCOPY N/A 09/25/2015   Procedure: MEDIASTINOSCOPY;  Surgeon: Melrose Nakayama, MD;  Location: [place];  Service: Thoracic;  Laterality: N/A;  . VIDEO BRONCHOSCOPY Bilateral 08/19/2015   Procedure: VIDEO BRONCHOSCOPY WITH FLUORO;  Surgeon: Rigoberto Noel, MD;  Location: [place];  Service: Cardiopulmonary;  Laterality: Bilateral;  . VIDEO BRONCHOSCOPY N/A 09/08/2015   Procedure: VIDEO BRONCHOSCOPY WITH FLUORO;  Surgeon: Rigoberto Noel, MD;  Location: [place];  Service: Thoracic;  Laterality: N/A;  . VIDEO BRONCHOSCOPY WITH ENDOBRONCHIAL ULTRASOUND Right 09/08/2015   Procedure: ATTEMPTED VIDEO BRONCHOSCOPY ENDOBRONCHIAL ULTRASOUND  ;  Surgeon: Rigoberto Noel, MD;  Location[facility];  Service: Thoracic;  Laterality: Right;    Family History  Problem Relation Age of Onset  . Hypertension Mother        F and M  . CAD Father   . Diabetes Other        auncle-aunts   . Cancer Other        UTERINE   . Breast cancer Sister   . Stroke Neg Hx   . Colon cancer Neg Hx     Social History Social History   Tobacco Use  . Smoking status: Never Smoker  . Smokeless tobacco: Never Used  Vaping Use  . Vaping Use: Never used  Substance Use Topics  . Alcohol use: Not Currently    Alcohol/week: 0.0 standard drinks  . Drug use: No    Current Outpatient Medications  Medication Sig Dispense Refill  . acetaminophen (TYLENOL) 500 MG tablet Take  1,000 mg by mouth every 6 (six) hours as needed.    . Cholecalciferol (VITAMIN D3 PO) Take 2 tablets by mouth daily.    Marland Kitchen docusate sodium (COLACE) 100 MG capsule take    . Ferrous Sulfate (IRON PO) Take 1 tablet by mouth daily.    . folic acid (FOLVITE) 1 MG tablet Take 1 tablet (1 mg total) by mouth daily. 90 tablet 0  . lidocaine-prilocaine (EMLA) cream Apply 1 application topically as needed.    . Omega-3 1000 MG CAPS     . dronabinol (MARINOL) 2.5 MG capsule Take 1 capsule (2.5 mg total) by mouth 2 (two) times daily before a meal. (Patient not taking: Reported on 08/05/2020) 60 capsule 0  . gabapentin (NEURONTIN) 300 MG capsule Take 200 mg by mouth 3 (three) times daily. (Patient not taking: Reported on 08/05/2020)    . ondansetron (ZOFRAN) 8 MG tablet Take 8 mg by mouth every 8 (eight) hours as needed for nausea or vomiting. (Patient not taking: Reported on 08/05/2020)     No current facility-administered medications for this visit.    No Known Allergies  Review of Systems  Constitutional: Positive for activity change.  Respiratory: Positive for cough and shortness of breath.   Cardiovascular: Positive for leg swelling. Negative for chest pain.  Gastrointestinal: Negative for abdominal pain.  Musculoskeletal: Positive for arthralgias.  All other systems reviewed and are negative.   BP (!) 149/89 (BP Location: Left Arm, Patient Position: Sitting, Cuff Size: Normal)   Pulse (!) 102   Temp 97.9 F (36.6 C) (Skin)   Resp 20   Ht 5\' 4"  (1.626 m)   Wt 131 lb 9.6 oz (59.7 kg)   LMP 06/09/1998   SpO2 96% Comment: RA  BMI 22.59 kg/m  Physical Exam Constitutional:      General: She is not in acute distress.    Appearance: Normal appearance.  HENT:     Head: Normocephalic and atraumatic.  Eyes:     General: No scleral icterus.    Extraocular Movements: Extraocular movements intact.  Cardiovascular:     Rate and Rhythm: Normal rate and regular rhythm.     Heart sounds: Normal heart  sounds. No murmur heard.   Pulmonary:     Effort: No respiratory distress.     Comments: Increased work of breathing.  Absent breath sounds right posterior lower two thirds Abdominal:     General: There is no distension.     Palpations: Abdomen is soft.     Tenderness: There is no  abdominal tenderness.  Musculoskeletal:     Cervical back: Neck supple.     Right lower leg: Edema present.     Left lower leg: Edema present.  Lymphadenopathy:     Cervical: No cervical adenopathy.  Skin:    General: Skin is warm and dry.  Neurological:     General: No focal deficit present.     Mental Status: She is alert and oriented to person, place, and time.     Cranial Nerves: No cranial nerve deficit.     Motor: No weakness.     Gait: Gait normal.    Diagnostic Tests: CT chest 07/15/2020 IMPRESSION: 1. Increasing bilateral pleural effusions now with nodular area along the periphery of RIGHT-sided effusion suspicious for worsening of disease. 2. Enlarging RIGHT lower lobe pulmonary mass, difficult to separate from surrounding volume loss with otherwise stable appearance of bilateral pulmonary nodules. 3. Diffuse skeletal metastatic disease with similar distribution but with increasing sclerosis, findings may reflect sclerosis in the setting of ongoing therapy and treatment response or worsening. Subsequent imaging may be helpful in this regard to assess for further change. 4. Cholelithiasis without evidence of acute cholecystitis. 5. 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 6. Aortic atherosclerosis.  Aortic Atherosclerosis (ICD10-I70.0).   Electronically Signed   By: Zetta Bills M.D.   On: 07/16/2020 10:41 CHEST - 2 VIEW  COMPARISON:  Two-view chest earlier today  FINDINGS: Patient has RIGHT-sided PowerPort, tip overlying the superior vena cava. Cardiac margins are partially obscured. RIGHT pleural effusion is smaller compared to prior study. There are patchy lung  opacities bilaterally, more confluent at the RIGHT lung base. There is no pneumothorax following thoracentesis.  IMPRESSION: Smaller RIGHT pleural effusion. No pneumothorax.   Electronically Signed   By: Nolon Nations M.D.   On: 08/04/2020 19:05 I personally reviewed the CT and chest x-ray images.  She has a relatively large right pleural effusion.  Impression: Valerie Long is a 75 year old woman with a past medical history of hypertension, hyperlipidemia, osteopenia, anemia, arthritis, and stage IV lung cancer.  She has malignant right pleural effusion.  She has had the effusion drained twice within the past month.  Both times it was limited due to discomfort.  She still had a large residual effusion each time.  She did have some symptomatic relief although yesterday was relatively minimal as only a small amount of fluid was removed.  We discussed potential options including continued intermittent thoracentesis.  I think that is a poor option.  The other options include a VATS for drainage of the effusion and talc pleurodesis versus a pleural catheter placement.  I think given her general overall condition and her desire to not disrupt her treatment of pleural catheter is that her best option.  I described the proposed procedure of right pleural catheter placement to her and her brother.  This would be done in the operating room with intravenous sedation and local anesthesia.  She would have an indwelling catheter afterwards.  She understands that we will need ongoing care.  I informed her of the indications, risks, benefits, and alternatives.  She understands the risks include, but not limited to bleeding, infection, catheter malposition, catheter occlusion, as well as possibility of other unforeseeable complications.  She accepts the risk and agrees to proceed  Plan: Right pleural catheter placement on Friday, 08/08/2020  Melrose Nakayama, MD [facility] [phone]

## 2020-08-06 ENCOUNTER — Other Ambulatory Visit (HOSPITAL_COMMUNITY)
Admission: RE | Admit: 2020-08-06 | Discharge: 2020-08-06 | Disposition: A | Discharge status: Home / Self Care | Payer: Medicare Other | Source: Ambulatory Visit | Attending: Thoracic Surgery (Cardiothoracic Vascular Surgery) | Admitting: Thoracic Surgery (Cardiothoracic Vascular Surgery)

## 2020-08-06 DIAGNOSIS — Z01812 Encounter for preprocedural laboratory examination: Secondary | ICD-10-CM | POA: Diagnosis not present

## 2020-08-06 DIAGNOSIS — Z20822 Contact with and (suspected) exposure to covid-19: Secondary | ICD-10-CM | POA: Insufficient documentation

## 2020-08-06 LAB — SARS CORONAVIRUS 2 (TAT 6-24 HRS): SARS Coronavirus 2: NEGATIVE

## 2020-08-07 ENCOUNTER — Other Ambulatory Visit: Payer: Self-pay

## 2020-08-07 ENCOUNTER — Encounter (HOSPITAL_COMMUNITY): Payer: Self-pay | Admitting: Thoracic Surgery (Cardiothoracic Vascular Surgery)

## 2020-08-07 NOTE — Progress Notes (Addendum)
I sent a message Valerie Long regarding labs for Valerie Long in am; patient had CBC with diff and BMP on 08/04/20 in the ED, do these need to be repeated?  Valerie Long said do not repeat CBC. I informed Valerie Long of the NA level of 128 and that repeat will be in am.  Valerie Long denies chest pain. Patient does chest short of breath easily and coughs frequently. Valerie Long states that she feel a little better that when she was in the ED.  Valerie. Long states that she does not need an interpreter.

## 2020-08-08 ENCOUNTER — Ambulatory Visit (HOSPITAL_COMMUNITY): Payer: Medicare Other | Admitting: Physician Assistant

## 2020-08-08 ENCOUNTER — Encounter (HOSPITAL_COMMUNITY)
Admission: RE | Disposition: A | Discharge status: Home / Self Care | Payer: Self-pay | Source: Home / Self Care | Attending: Thoracic Surgery (Cardiothoracic Vascular Surgery)

## 2020-08-08 ENCOUNTER — Encounter (HOSPITAL_COMMUNITY): Payer: Self-pay | Admitting: Thoracic Surgery (Cardiothoracic Vascular Surgery)

## 2020-08-08 ENCOUNTER — Ambulatory Visit (HOSPITAL_COMMUNITY): Payer: Medicare Other

## 2020-08-08 ENCOUNTER — Other Ambulatory Visit: Payer: Self-pay

## 2020-08-08 ENCOUNTER — Ambulatory Visit (HOSPITAL_COMMUNITY)
Admission: RE | Admit: 2020-08-08 | Discharge: 2020-08-08 | Disposition: A | Discharge status: Home / Self Care | Payer: Medicare Other | Attending: Thoracic Surgery (Cardiothoracic Vascular Surgery) | Admitting: Thoracic Surgery (Cardiothoracic Vascular Surgery)

## 2020-08-08 DIAGNOSIS — J90 Pleural effusion, not elsewhere classified: Secondary | ICD-10-CM | POA: Diagnosis not present

## 2020-08-08 DIAGNOSIS — J910 Malignant pleural effusion: Secondary | ICD-10-CM | POA: Diagnosis not present

## 2020-08-08 DIAGNOSIS — I10 Essential (primary) hypertension: Secondary | ICD-10-CM | POA: Insufficient documentation

## 2020-08-08 DIAGNOSIS — D649 Anemia, unspecified: Secondary | ICD-10-CM | POA: Diagnosis not present

## 2020-08-08 DIAGNOSIS — E7800 Pure hypercholesterolemia, unspecified: Secondary | ICD-10-CM | POA: Diagnosis not present

## 2020-08-08 DIAGNOSIS — Z01818 Encounter for other preprocedural examination: Secondary | ICD-10-CM | POA: Diagnosis not present

## 2020-08-08 DIAGNOSIS — Z79899 Other long term (current) drug therapy: Secondary | ICD-10-CM | POA: Insufficient documentation

## 2020-08-08 DIAGNOSIS — C7951 Secondary malignant neoplasm of bone: Secondary | ICD-10-CM | POA: Insufficient documentation

## 2020-08-08 DIAGNOSIS — C3491 Malignant neoplasm of unspecified part of right bronchus or lung: Secondary | ICD-10-CM | POA: Insufficient documentation

## 2020-08-08 HISTORY — DX: Respiratory tuberculosis unspecified: A15.9

## 2020-08-08 HISTORY — PX: CHEST TUBE INSERTION: SHX231

## 2020-08-08 LAB — COMPREHENSIVE METABOLIC PANEL
ALT: 12 U/L (ref 0–44)
AST: 29 U/L (ref 15–41)
Albumin: 2.9 g/dL — ABNORMAL LOW (ref 3.5–5.0)
Alkaline Phosphatase: 165 U/L — ABNORMAL HIGH (ref 38–126)
Anion gap: 11 (ref 5–15)
BUN: 8 mg/dL (ref 8–23)
CO2: 27 mmol/L (ref 22–32)
Calcium: 9.5 mg/dL (ref 8.9–10.3)
Chloride: 95 mmol/L — ABNORMAL LOW (ref 98–111)
Creatinine, Ser: 0.92 mg/dL (ref 0.44–1.00)
GFR, Estimated: >60 mL/min (ref >60)
Glucose, Bld: 93 mg/dL (ref 70–99)
Potassium: 3.9 mmol/L (ref 3.5–5.1)
Sodium: 133 mmol/L — ABNORMAL LOW (ref 135–145)
Total Bilirubin: 0.5 mg/dL (ref 0.3–1.2)
Total Protein: 7.4 g/dL (ref 6.5–8.1)

## 2020-08-08 LAB — PROTIME-INR
INR: 1 (ref 0.8–1.2)
Prothrombin Time: 12.4 seconds (ref 11.4–15.2)

## 2020-08-08 SURGERY — INSERTION, PLEURAL DRAINAGE CATHETER
Anesthesia: Monitor Anesthesia Care | Site: Chest | Laterality: Right

## 2020-08-08 MED ORDER — LIDOCAINE HCL (PF) 1 % IJ SOLN
INTRAMUSCULAR | Status: DC | PRN
  Administered 2020-08-08: 10 mL

## 2020-08-08 MED ORDER — 0.9 % SODIUM CHLORIDE (POUR BTL) OPTIME
TOPICAL | Status: DC | PRN
  Administered 2020-08-08: 1000 mL

## 2020-08-08 MED ORDER — CEFAZOLIN SODIUM-DEXTROSE 2-4 GM/100ML-% IV SOLN
2.0000 g | INTRAVENOUS | Status: AC
  Administered 2020-08-08: 2 g via INTRAVENOUS
  Filled 2020-08-08: qty 100

## 2020-08-08 MED ORDER — ACETAMINOPHEN 10 MG/ML IV SOLN
INTRAVENOUS | Status: AC
  Filled 2020-08-08: qty 100

## 2020-08-08 MED ORDER — FENTANYL CITRATE (PF) 250 MCG/5ML IJ SOLN
INTRAMUSCULAR | Status: AC
  Filled 2020-08-08: qty 5

## 2020-08-08 MED ORDER — LACTATED RINGERS IV SOLN: INTRAVENOUS | Status: DC | PRN

## 2020-08-08 MED ORDER — PHENYLEPHRINE HCL (PRESSORS) 10 MG/ML IV SOLN
INTRAVENOUS | Status: DC | PRN
  Administered 2020-08-08: 240 ug via INTRAVENOUS
  Administered 2020-08-08 (×5): 120 ug via INTRAVENOUS
  Administered 2020-08-08: 80 ug via INTRAVENOUS
  Administered 2020-08-08: 240 ug via INTRAVENOUS

## 2020-08-08 MED ORDER — KETOROLAC TROMETHAMINE 15 MG/ML IJ SOLN
INTRAMUSCULAR | Status: AC
  Filled 2020-08-08: qty 1

## 2020-08-08 MED ORDER — LIDOCAINE 2% (20 MG/ML) 5 ML SYRINGE
INTRAMUSCULAR | Status: DC | PRN
  Administered 2020-08-08: 40 mg via INTRAVENOUS

## 2020-08-08 MED ORDER — ONDANSETRON HCL 4 MG/2ML IJ SOLN
INTRAMUSCULAR | Status: AC
  Filled 2020-08-08: qty 2

## 2020-08-08 MED ORDER — KETOROLAC TROMETHAMINE 15 MG/ML IJ SOLN
15.0000 mg | Freq: Once | INTRAMUSCULAR | Status: AC | PRN
  Administered 2020-08-08: 15 mg via INTRAVENOUS

## 2020-08-08 MED ORDER — AMISULPRIDE (ANTIEMETIC) 5 MG/2ML IV SOLN
INTRAVENOUS | Status: AC
  Filled 2020-08-08: qty 4

## 2020-08-08 MED ORDER — FENTANYL CITRATE (PF) 100 MCG/2ML IJ SOLN
INTRAMUSCULAR | Status: AC
  Filled 2020-08-08: qty 2

## 2020-08-08 MED ORDER — AMISULPRIDE (ANTIEMETIC) 5 MG/2ML IV SOLN
10.0000 mg | Freq: Once | INTRAVENOUS | Status: AC
  Administered 2020-08-08: 10 mg via INTRAVENOUS

## 2020-08-08 MED ORDER — DEXMEDETOMIDINE (PRECEDEX) IN NS 20 MCG/5ML (4 MCG/ML) IV SYRINGE
PREFILLED_SYRINGE | INTRAVENOUS | Status: DC | PRN
  Administered 2020-08-08: 4 ug via INTRAVENOUS

## 2020-08-08 MED ORDER — CHLORHEXIDINE GLUCONATE 0.12 % MT SOLN
OROMUCOSAL | Status: AC
  Administered 2020-08-08: 15 mL
  Filled 2020-08-08: qty 15

## 2020-08-08 MED ORDER — FENTANYL CITRATE (PF) 100 MCG/2ML IJ SOLN
25.0000 ug | INTRAMUSCULAR | Status: DC | PRN
  Administered 2020-08-08 (×2): 50 ug via INTRAVENOUS

## 2020-08-08 MED ORDER — TRAMADOL HCL 50 MG PO TABS: 50.0000 mg | ORAL_TABLET | Freq: Four times a day (QID) | ORAL | Status: DC | PRN

## 2020-08-08 MED ORDER — PROPOFOL 500 MG/50ML IV EMUL
INTRAVENOUS | Status: DC | PRN
  Administered 2020-08-08: 100 ug/kg/min via INTRAVENOUS
  Administered 2020-08-08: 50 ug/kg/min via INTRAVENOUS

## 2020-08-08 MED ORDER — TRAMADOL HCL 50 MG PO TABS: 50.0000 mg | ORAL_TABLET | Freq: Four times a day (QID) | ORAL | 0 refills | Status: AC | PRN

## 2020-08-08 MED ORDER — ONDANSETRON HCL 4 MG/2ML IJ SOLN
4.0000 mg | Freq: Once | INTRAMUSCULAR | Status: AC | PRN
  Administered 2020-08-08: 4 mg via INTRAVENOUS

## 2020-08-08 MED ORDER — ACETAMINOPHEN 10 MG/ML IV SOLN
1000.0000 mg | Freq: Once | INTRAVENOUS | Status: DC | PRN
  Administered 2020-08-08: 1000 mg via INTRAVENOUS

## 2020-08-08 MED ORDER — FENTANYL CITRATE (PF) 100 MCG/2ML IJ SOLN
INTRAMUSCULAR | Status: DC | PRN
  Administered 2020-08-08 (×3): 50 ug via INTRAVENOUS

## 2020-08-08 SURGICAL SUPPLY — 27 items
BLADE CLIPPER SURG (BLADE) ×2 IMPLANT
CANISTER SUCT 3000ML PPV (MISCELLANEOUS) ×2 IMPLANT
COVER SURGICAL LIGHT HANDLE (MISCELLANEOUS) ×2 IMPLANT
DERMABOND ADVANCED (GAUZE/BANDAGES/DRESSINGS) ×1
DERMABOND ADVANCED .7 DNX12 (GAUZE/BANDAGES/DRESSINGS) ×1 IMPLANT
DRAPE C-ARM 42X72 X-RAY (DRAPES) ×2 IMPLANT
DRAPE LAPAROSCOPIC ABDOMINAL (DRAPES) ×2 IMPLANT
GLOVE SURG SIGNA 7.5 PF LTX (GLOVE) ×2 IMPLANT
GOWN STRL REUS W/ TWL LRG LVL3 (GOWN DISPOSABLE) ×1 IMPLANT
GOWN STRL REUS W/ TWL XL LVL3 (GOWN DISPOSABLE) ×1 IMPLANT
GOWN STRL REUS W/TWL LRG LVL3 (GOWN DISPOSABLE) ×1
GOWN STRL REUS W/TWL XL LVL3 (GOWN DISPOSABLE) ×1
KIT BASIN OR (CUSTOM PROCEDURE TRAY) ×2 IMPLANT
KIT PLEURX DRAIN CATH 1000ML (MISCELLANEOUS) ×4 IMPLANT
KIT PLEURX DRAIN CATH 15.5FR (DRAIN) ×2 IMPLANT
KIT TURNOVER KIT B (KITS) ×2 IMPLANT
NS IRRIG 1000ML POUR BTL (IV SOLUTION) ×2 IMPLANT
PACK GENERAL/GYN (CUSTOM PROCEDURE TRAY) ×2 IMPLANT
PAD ARMBOARD 7.5X6 YLW CONV (MISCELLANEOUS) ×4 IMPLANT
SET DRAINAGE LINE (MISCELLANEOUS) IMPLANT
SUT ETHILON 3 0 FSL (SUTURE) ×2 IMPLANT
SUT VIC AB 3-0 X1 27 (SUTURE) ×2 IMPLANT
SYR CONTROL 10ML LL (SYRINGE) ×2 IMPLANT
TOWEL GREEN STERILE (TOWEL DISPOSABLE) ×2 IMPLANT
TOWEL GREEN STERILE FF (TOWEL DISPOSABLE) ×2 IMPLANT
VALVE REPLACEMENT CAP (MISCELLANEOUS) IMPLANT
WATER STERILE IRR 1000ML POUR (IV SOLUTION) ×2 IMPLANT

## 2020-08-08 NOTE — Transfer of Care (Signed)
Immediate Anesthesia Transfer of Care Note  Patient: Valerie Long  Procedure(s) Performed: INSERTION PLEURAL DRAINAGE CATHETER (Right Chest)  Patient Location: PACU  Anesthesia Type:MAC  Level of Consciousness: awake, alert  and oriented  Airway & Oxygen Therapy: Patient Spontanous Breathing and Patient connected to face mask oxygen  Post-op Assessment: Report given to RN and Post -op Vital signs reviewed and stable  Post vital signs: Reviewed and stable  Last Vitals:  Vitals Value Taken Time  BP 131/94 08/08/20 1240  Temp 36.6 C 08/08/20 1240  Pulse 101 08/08/20 1242  Resp 13 08/08/20 1242  SpO2 100 % 08/08/20 1242  Vitals shown include unvalidated device data.  Last Pain:  Vitals:   08/08/20 0958  TempSrc:   PainSc: 5          Complications: No complications documented.

## 2020-08-08 NOTE — Discharge Planning (Signed)
Anguel Delapena J. Clydene Laming, RN, BSN, [place] [phone] RNCM consulted regarding discharge planning for Valerie Long. Offered pt medicare.gov list of home health agencies to choose from.  Pt chose [facility] to render services. [facility] [place] notified. DME needs include drainage kits.  Pt will discharge with two boxes.

## 2020-08-08 NOTE — Brief Op Note (Signed)
08/08/2020  12:45 PM  PATIENT:  Valerie Long  75 y.o. female  PRE-OPERATIVE DIAGNOSIS:  MALIGNANT RIGHT PLEURAL EFFUSION  POST-OPERATIVE DIAGNOSIS:  MALIGNANT RIGHT PLEURAL EFFUSION  PROCEDURE:  Procedure(s): INSERTION PLEURAL DRAINAGE CATHETER (Right)  SURGEON:  Surgeon(s) and Role:    * Melrose Nakayama, MD - Primary  PHYSICIAN ASSISTANT:   ASSISTANTS: none   ANESTHESIA:   local and IV sedation  EBL: minimal  BLOOD ADMINISTERED:none  DRAINS: Pleural catheter   LOCAL MEDICATIONS USED:  LIDOCAINE  and Amount: 10 ml  SPECIMEN:  Source of Specimen:  pleural fluid  DISPOSITION OF SPECIMEN:  N/A  COUNTS:  YES  TOURNIQUET:  * No tourniquets in log *  DICTATION: .Other Dictation: Dictation Number -  PLAN OF CARE: Discharge to home after PACU  PATIENT DISPOSITION:  PACU - hemodynamically stable.   Delay start of Pharmacological VTE agent (>24hrs) due to surgical blood loss or risk of bleeding: not applicable

## 2020-08-08 NOTE — Anesthesia Procedure Notes (Signed)
Procedure Name: Intubation Date/Time: 08/08/2020 11:50 AM Performed by: Griffin Dakin, CRNA Pre-anesthesia Checklist: Patient identified, Emergency Drugs available, Suction available and Patient being monitored Patient Re-evaluated:Patient Re-evaluated prior to induction Oxygen Delivery Method: Simple face mask Induction Type: IV induction Placement Confirmation: positive ETCO2 and breath sounds checked- equal and bilateral Dental Injury: Teeth and Oropharynx as per pre-operative assessment

## 2020-08-08 NOTE — Interval H&P Note (Signed)
History and Physical Interval Note:  08/08/2020 11:11 AM  Valerie Long  has presented today for surgery, with the diagnosis of MALIGNANT RIGHT PLEURAL EFFUSION.  The various methods of treatment have been discussed with the patient and family. After consideration of risks, benefits and other options for treatment, the patient has consented to  Procedure(s): INSERTION PLEURAL DRAINAGE CATHETER (Right) as a surgical intervention.  The patient's history has been reviewed, patient examined, no change in status, stable for surgery.  I have reviewed the patient's chart and labs.  Questions were answered to the patient's satisfaction.     Melrose Nakayama

## 2020-08-08 NOTE — Anesthesia Preprocedure Evaluation (Addendum)
Anesthesia Evaluation  Patient identified by MRN, date of birth, ID band Patient awake    Reviewed: Allergy & Precautions, NPO status , Patient's Chart, lab work & pertinent test results  Airway Mallampati: II  TM Distance: >3 FB Neck ROM: Full    Dental no notable dental hx.    Pulmonary shortness of breath,  Adenocarcinoma of right lung, stage    Pulmonary exam normal breath sounds clear to auscultation       Cardiovascular hypertension,  Rhythm:Regular Rate:Tachycardia  ECG: ST, rate 116   Neuro/Psych negative neurological ROS  negative psych ROS   GI/Hepatic negative GI ROS, Neg liver ROS,   Endo/Other  hypnatremia  Renal/GU negative Renal ROS     Musculoskeletal  (+) Arthritis ,   Abdominal   Peds  Hematology  (+) anemia , HLD   Anesthesia Other Findings MALIGNANT RIGHT PLEURAL EFFUSION  Reproductive/Obstetrics                            Anesthesia Physical Anesthesia Plan  ASA: III  Anesthesia Plan: MAC   Post-op Pain Management:    Induction: Intravenous  PONV Risk Score and Plan: 2 and Ondansetron, Dexamethasone, Midazolam and Treatment may vary due to age or medical condition  Airway Management Planned: Simple Face Mask  Additional Equipment:   Intra-op Plan:   Post-operative Plan:   Informed Consent: I have reviewed the patients History and Physical, chart, labs and discussed the procedure including the risks, benefits and alternatives for the proposed anesthesia with the patient or authorized representative who has indicated his/her understanding and acceptance.     Dental advisory given  Plan Discussed with: CRNA  Anesthesia Plan Comments:        Anesthesia Quick Evaluation

## 2020-08-08 NOTE — Discharge Instructions (Addendum)
Do not drive or engage in heavy physical activity for 24 hours.  You may resume normal activities tomorrow.  You may shower tomorrow. Leave dressing in place over catheter when showering.  We will arrange a home health nurse to come and assist with drainage of the catheter.  You may use acetaminophen (Tylenol) for pain. You have a prescription for tramadol, a narcotic pain reliever. You may use the tramadol in addition to acetaminophen as needed for more severe pain.  Call [phone] if you develop chest pain, shortness of breath, fever > 101 F or notice increased pain, swelling, redness or drainage from the cathter or incision.  My office will contact you with a follow up appoinment.

## 2020-08-09 ENCOUNTER — Encounter (HOSPITAL_COMMUNITY): Payer: Self-pay | Admitting: Thoracic Surgery (Cardiothoracic Vascular Surgery)

## 2020-08-09 DIAGNOSIS — D630 Anemia in neoplastic disease: Secondary | ICD-10-CM | POA: Diagnosis not present

## 2020-08-09 DIAGNOSIS — E7800 Pure hypercholesterolemia, unspecified: Secondary | ICD-10-CM | POA: Diagnosis not present

## 2020-08-09 DIAGNOSIS — I700 Atherosclerosis of aorta: Secondary | ICD-10-CM | POA: Diagnosis not present

## 2020-08-09 DIAGNOSIS — M8580 Other specified disorders of bone density and structure, unspecified site: Secondary | ICD-10-CM | POA: Diagnosis not present

## 2020-08-09 DIAGNOSIS — J910 Malignant pleural effusion: Secondary | ICD-10-CM | POA: Diagnosis not present

## 2020-08-09 DIAGNOSIS — I10 Essential (primary) hypertension: Secondary | ICD-10-CM | POA: Diagnosis not present

## 2020-08-09 DIAGNOSIS — C3491 Malignant neoplasm of unspecified part of right bronchus or lung: Secondary | ICD-10-CM | POA: Diagnosis not present

## 2020-08-09 DIAGNOSIS — K8020 Calculus of gallbladder without cholecystitis without obstruction: Secondary | ICD-10-CM | POA: Diagnosis not present

## 2020-08-09 DIAGNOSIS — R600 Localized edema: Secondary | ICD-10-CM | POA: Diagnosis not present

## 2020-08-09 DIAGNOSIS — C7951 Secondary malignant neoplasm of bone: Secondary | ICD-10-CM | POA: Diagnosis not present

## 2020-08-09 DIAGNOSIS — R5081 Fever presenting with conditions classified elsewhere: Secondary | ICD-10-CM | POA: Diagnosis not present

## 2020-08-09 DIAGNOSIS — M1990 Unspecified osteoarthritis, unspecified site: Secondary | ICD-10-CM | POA: Diagnosis not present

## 2020-08-09 DIAGNOSIS — D709 Neutropenia, unspecified: Secondary | ICD-10-CM | POA: Diagnosis not present

## 2020-08-09 DIAGNOSIS — Z9181 History of falling: Secondary | ICD-10-CM | POA: Diagnosis not present

## 2020-08-09 DIAGNOSIS — Z438 Encounter for attention to other artificial openings: Secondary | ICD-10-CM | POA: Diagnosis not present

## 2020-08-09 NOTE — Op Note (Signed)
NAMELORRI, FUKUHARA MEDICAL RECORD NO: [id number] ACCOUNT NO: [id number] DATE OF BIRTH: [birth year] FACILITY: [facility] LOCATION: [facility] PHYSICIAN: Revonda Standard. Roxan Hockey, MD  Operative Report   DATE OF PROCEDURE: 08/08/2020  PREOPERATIVE DIAGNOSIS:  Malignant right pleural effusion.  POSTOPERATIVE DIAGNOSIS:  Malignant right pleural effusion.  PROCEDURE:  Insertion of pleural drainage catheter.  SURGEON:  Modesto Charon, MD  ASSISTANT:  None.  ANESTHESIA:  Local with intravenous sedation.  FINDINGS:  1.1 liters of bloody pleural fluid evacuated and complete reexpansion of the right lower lobe.  Good positioning of the catheter.  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  She had 2 recent thoracenteses with rapid reaccumulation of fluid after both occasions.  She was offered a pleural  drainage catheter for palliative management of her malignant pleural effusion.  The indications, risks, benefits, alternatives were discussed in detail with the patient.  She understood the need for ongoing care with the catheter.  She accepted the risks  and agreed to proceed.  DESCRIPTION OF PROCEDURE:  [name] was brought to the operating room on 08/08/2020.  She was placed in the supine position and her right chest was elevated slightly.  She was given intravenous sedation and monitored by the anesthesia service.   Intravenous antibiotics were administered.  Sequential compression devices were placed on the calves for DVT prophylaxis.  The chest and upper abdomen were prepped and draped in the usual sterile fashion.  Timeout was performed.  Fluoroscopy was used to ensure that we were at the correct level of the effusion.  The operative site then was anesthetized with 10 mL of 1% lidocaine local anesthetic.  After allowing time for local anesthetic effect, an incision  was made in the lateral chest.  The  pleural space was accessed using a modified Seldinger technique.  There was a return of bloody fluid with the passage of the needle. The wire passed easily.  Fluoroscopy confirmed position of the wire within the  chest.  An incision then was made at the catheter exit site and the catheter was tunneled with the cuff of the catheter left just under the skin at the exit site.  The tract over the wire was dilated.  The catheter then was advanced through the peel-away  sheath introducer, which was then removed.  The catheter was connected to the vacuum bottle and drainage of the effusion was begun.  The catheter was secured at the exit site with a 3-0 nylon suture.  The skin at the lateral incision was closed with a  running 3-0 Vicryl subcuticular suture.  Approximately a liter of fluid drained.  A second bottle was attached but only about 100 mL of fluid was drained before the patient began coughing.  Inspection with fluoroscopy revealed the catheter in good  position.  The effusion was nearly completely drained and there was incomplete reexpansion of the right lower lobe.  The catheter then was capped and dressed in standard fashion.  The patient was taken from the operating room to the postanesthetic care  unit in good condition.   SHW D: 08/08/2020 1:38:07 pm T: 08/09/2020 4:12:00 am  JOB: [id number]/ [id number]

## 2020-08-09 NOTE — Anesthesia Postprocedure Evaluation (Signed)
Anesthesia Post Note  Patient: Missi Mcmackin  Procedure(s) Performed: INSERTION PLEURAL DRAINAGE CATHETER (Right Chest)     Patient location during evaluation: PACU Anesthesia Type: MAC Level of consciousness: awake Pain management: pain level controlled Vital Signs Assessment: post-procedure vital signs reviewed and stable Respiratory status: spontaneous breathing, nonlabored ventilation, respiratory function stable and patient connected to nasal cannula oxygen Cardiovascular status: stable and blood pressure returned to baseline Postop Assessment: no apparent nausea or vomiting Anesthetic complications: no   No complications documented.  Last Vitals:  Vitals:   08/08/20 1405 08/08/20 1410  BP:  117/75  Pulse:  97  Resp: 12 12  Temp:  36.6 C  SpO2:  100%    Last Pain:  Vitals:   08/08/20 1410  TempSrc:   PainSc: Asleep                 Jousha Schwandt P Alexsander Cavins

## 2020-08-10 DIAGNOSIS — J910 Malignant pleural effusion: Secondary | ICD-10-CM | POA: Diagnosis not present

## 2020-08-10 DIAGNOSIS — C7951 Secondary malignant neoplasm of bone: Secondary | ICD-10-CM | POA: Diagnosis not present

## 2020-08-10 DIAGNOSIS — D630 Anemia in neoplastic disease: Secondary | ICD-10-CM | POA: Diagnosis not present

## 2020-08-10 DIAGNOSIS — C3491 Malignant neoplasm of unspecified part of right bronchus or lung: Secondary | ICD-10-CM | POA: Diagnosis not present

## 2020-08-10 DIAGNOSIS — I10 Essential (primary) hypertension: Secondary | ICD-10-CM | POA: Diagnosis not present

## 2020-08-10 DIAGNOSIS — Z438 Encounter for attention to other artificial openings: Secondary | ICD-10-CM | POA: Diagnosis not present

## 2020-08-11 DIAGNOSIS — D630 Anemia in neoplastic disease: Secondary | ICD-10-CM | POA: Diagnosis not present

## 2020-08-11 DIAGNOSIS — Z438 Encounter for attention to other artificial openings: Secondary | ICD-10-CM | POA: Diagnosis not present

## 2020-08-11 DIAGNOSIS — J910 Malignant pleural effusion: Secondary | ICD-10-CM | POA: Diagnosis not present

## 2020-08-11 DIAGNOSIS — I10 Essential (primary) hypertension: Secondary | ICD-10-CM | POA: Diagnosis not present

## 2020-08-11 DIAGNOSIS — C7951 Secondary malignant neoplasm of bone: Secondary | ICD-10-CM | POA: Diagnosis not present

## 2020-08-11 DIAGNOSIS — C3491 Malignant neoplasm of unspecified part of right bronchus or lung: Secondary | ICD-10-CM | POA: Diagnosis not present

## 2020-08-13 ENCOUNTER — Telehealth: Payer: Self-pay

## 2020-08-13 NOTE — Telephone Encounter (Signed)
Pts brother, Frederico Hamman, called stating the pt c/o fatigue. Denies fever. Pt had her pleural drainage catheter installed 08/08/20. He states the tube has helped with her cough. Pt eats "very little" and does not drink supplements because when she drank drank Ensure, she became nauseous. She has not tried any other supplement. Pts tubing is drained daily. When asked if the drainage is bloody, he described the fluid as a little "dark". When asked dark in what way, he indicated he does not know, "maybe a little bloody". I advised him to also contact Dr. Leonarda Salon office as well regarding the color of the fluid being drained.

## 2020-08-14 DIAGNOSIS — C7951 Secondary malignant neoplasm of bone: Secondary | ICD-10-CM | POA: Diagnosis not present

## 2020-08-14 DIAGNOSIS — C3491 Malignant neoplasm of unspecified part of right bronchus or lung: Secondary | ICD-10-CM | POA: Diagnosis not present

## 2020-08-14 DIAGNOSIS — J910 Malignant pleural effusion: Secondary | ICD-10-CM | POA: Diagnosis not present

## 2020-08-14 DIAGNOSIS — I10 Essential (primary) hypertension: Secondary | ICD-10-CM | POA: Diagnosis not present

## 2020-08-14 DIAGNOSIS — Z438 Encounter for attention to other artificial openings: Secondary | ICD-10-CM | POA: Diagnosis not present

## 2020-08-14 DIAGNOSIS — D630 Anemia in neoplastic disease: Secondary | ICD-10-CM | POA: Diagnosis not present

## 2020-08-18 ENCOUNTER — Ambulatory Visit: Payer: Medicare Other

## 2020-08-18 ENCOUNTER — Other Ambulatory Visit: Payer: Medicare Other

## 2020-08-18 ENCOUNTER — Ambulatory Visit: Payer: Medicare Other | Admitting: Internal Medicine

## 2020-08-18 DIAGNOSIS — J910 Malignant pleural effusion: Secondary | ICD-10-CM | POA: Diagnosis not present

## 2020-08-18 DIAGNOSIS — Z438 Encounter for attention to other artificial openings: Secondary | ICD-10-CM | POA: Diagnosis not present

## 2020-08-18 DIAGNOSIS — C7951 Secondary malignant neoplasm of bone: Secondary | ICD-10-CM | POA: Diagnosis not present

## 2020-08-18 DIAGNOSIS — I10 Essential (primary) hypertension: Secondary | ICD-10-CM | POA: Diagnosis not present

## 2020-08-18 DIAGNOSIS — D630 Anemia in neoplastic disease: Secondary | ICD-10-CM | POA: Diagnosis not present

## 2020-08-18 DIAGNOSIS — C3491 Malignant neoplasm of unspecified part of right bronchus or lung: Secondary | ICD-10-CM | POA: Diagnosis not present

## 2020-08-20 ENCOUNTER — Inpatient Hospital Stay: Payer: Medicare Other | Attending: Internal Medicine | Admitting: Internal Medicine

## 2020-08-20 ENCOUNTER — Other Ambulatory Visit: Payer: Self-pay

## 2020-08-20 ENCOUNTER — Inpatient Hospital Stay: Payer: Medicare Other

## 2020-08-20 VITALS — BP 151/87 | HR 96 | Temp 97.7°F | Resp 18 | Ht 64.0 in | Wt 131.7 lb

## 2020-08-20 DIAGNOSIS — C7951 Secondary malignant neoplasm of bone: Secondary | ICD-10-CM | POA: Diagnosis not present

## 2020-08-20 DIAGNOSIS — Z79899 Other long term (current) drug therapy: Secondary | ICD-10-CM | POA: Diagnosis not present

## 2020-08-20 DIAGNOSIS — J90 Pleural effusion, not elsewhere classified: Secondary | ICD-10-CM | POA: Diagnosis not present

## 2020-08-20 DIAGNOSIS — C3491 Malignant neoplasm of unspecified part of right bronchus or lung: Secondary | ICD-10-CM | POA: Diagnosis not present

## 2020-08-20 DIAGNOSIS — Z95828 Presence of other vascular implants and grafts: Secondary | ICD-10-CM | POA: Diagnosis not present

## 2020-08-20 DIAGNOSIS — C3431 Malignant neoplasm of lower lobe, right bronchus or lung: Secondary | ICD-10-CM | POA: Insufficient documentation

## 2020-08-20 DIAGNOSIS — M8580 Other specified disorders of bone density and structure, unspecified site: Secondary | ICD-10-CM | POA: Diagnosis not present

## 2020-08-20 DIAGNOSIS — Z5112 Encounter for antineoplastic immunotherapy: Secondary | ICD-10-CM | POA: Diagnosis not present

## 2020-08-20 DIAGNOSIS — I10 Essential (primary) hypertension: Secondary | ICD-10-CM | POA: Diagnosis not present

## 2020-08-20 DIAGNOSIS — R5382 Chronic fatigue, unspecified: Secondary | ICD-10-CM

## 2020-08-20 LAB — CBC WITH DIFFERENTIAL (CANCER CENTER ONLY)
Abs Immature Granulocytes: 0.02 10*3/uL (ref 0.00–0.07)
Basophils Absolute: 0 10*3/uL (ref 0.0–0.1)
Basophils Relative: 0 %
Eosinophils Absolute: 0.1 10*3/uL (ref 0.0–0.5)
Eosinophils Relative: 2 %
HCT: 31.1 % — ABNORMAL LOW (ref 36.0–46.0)
Hemoglobin: 10.1 g/dL — ABNORMAL LOW (ref 12.0–15.0)
Immature Granulocytes: 0 %
Lymphocytes Relative: 14 %
Lymphs Abs: 0.8 10*3/uL (ref 0.7–4.0)
MCH: 28.6 pg (ref 26.0–34.0)
MCHC: 32.5 g/dL (ref 30.0–36.0)
MCV: 88.1 fL (ref 80.0–100.0)
Monocytes Absolute: 0.6 10*3/uL (ref 0.1–1.0)
Monocytes Relative: 10 %
Neutro Abs: 4 10*3/uL (ref 1.7–7.7)
Neutrophils Relative %: 74 %
Platelet Count: 274 10*3/uL (ref 150–400)
RBC: 3.53 MIL/uL — ABNORMAL LOW (ref 3.87–5.11)
RDW: 15.6 % — ABNORMAL HIGH (ref 11.5–15.5)
WBC Count: 5.5 10*3/uL (ref 4.0–10.5)
nRBC: 0 % (ref 0.0–0.2)

## 2020-08-20 LAB — CMP (CANCER CENTER ONLY)
ALT: 10 U/L (ref 0–44)
AST: 23 U/L (ref 15–41)
Albumin: 2.6 g/dL — ABNORMAL LOW (ref 3.5–5.0)
Alkaline Phosphatase: 188 U/L — ABNORMAL HIGH (ref 38–126)
Anion gap: 4 — ABNORMAL LOW (ref 5–15)
BUN: 12 mg/dL (ref 8–23)
CO2: 29 mmol/L (ref 22–32)
Calcium: 9.4 mg/dL (ref 8.9–10.3)
Chloride: 99 mmol/L (ref 98–111)
Creatinine: 0.81 mg/dL (ref 0.44–1.00)
GFR, Estimated: >60 mL/min (ref >60)
Glucose, Bld: 91 mg/dL (ref 70–99)
Potassium: 4.3 mmol/L (ref 3.5–5.1)
Sodium: 132 mmol/L — ABNORMAL LOW (ref 135–145)
Total Bilirubin: 0.3 mg/dL (ref 0.3–1.2)
Total Protein: 6.7 g/dL (ref 6.5–8.1)

## 2020-08-20 LAB — TSH: TSH: 1.56 u[IU]/mL (ref 0.308–3.960)

## 2020-08-20 MED ORDER — METHYLPREDNISOLONE 4 MG PO TBPK: ORAL_TABLET | ORAL | 0 refills | Status: DC

## 2020-08-20 MED ORDER — SODIUM CHLORIDE 0.9% FLUSH
10.0000 mL | Freq: Once | INTRAVENOUS | Status: AC | PRN
  Administered 2020-08-20: 10 mL
  Filled 2020-08-20: qty 10

## 2020-08-20 MED ORDER — HEPARIN SOD (PORK) LOCK FLUSH 100 UNIT/ML IV SOLN
500.0000 [IU] | Freq: Once | INTRAVENOUS | Status: AC | PRN
  Administered 2020-08-20: 500 [IU]
  Filled 2020-08-20: qty 5

## 2020-08-20 MED ORDER — PROCHLORPERAZINE MALEATE 10 MG PO TABS: 10.0000 mg | ORAL_TABLET | Freq: Four times a day (QID) | ORAL | 0 refills | Status: DC | PRN

## 2020-08-20 NOTE — Progress Notes (Signed)
[facility] Telephone:([phone]) [phone]   Fax:([phone]) [place], MD [place] [address] [place] [address]  DIAGNOSIS: Stage IV (T1b, N2, M1b) non-small cell lung cancer, adenocarcinoma diagnosed in March 2017 and presented with right lower lobe lung nodule in addition to mediastinal lymphadenopathy and metastatic bone lesions.  Molecular studies: PDL 1 TPS  <1%.   Foundation One Studies: Positive for ERBB2 [id number]. Negative for EGFR, KRAS, ALK, BRAF, MET, RET and ROS1.  PRIOR THERAPY:  1) Induction systemic chemotherapy with carboplatin for AUC of 5 and Alimta 500 MG/M2 every 3 weeks is status post 6 cycles at the [facility], last dose was given 03/05/2016 with stable disease. 2) palliative radiation to the metastatic bone disease in the lower back and pelvic area. 3) Maintenance systemic chemotherapy with Alimta 500 MG/M2 every 3 weeks status post 73 cycles. First dose was given 03/25/2016.  Last dose was given June 16, 2020.  CURRENT THERAPY::  1) second line treatment with immunotherapy with nivolumab 480 IV every 4 weeks.  First dose July 23, 2020.  Status post 1 cycle. 2) Zometa 4 mg IV every 12 week for metastatic bone disease.  INTERVAL HIST Valerie Long 75 y.o. female returns to the clinic today for follow-up visit accompanied by her brother Frederico Hamman.  The patient has a Pleurx catheter placed by Dr. Roxan Hockey and she is currently draining around 150 cc daily.  She feels short of breath and suffocated most of the time.  Her oxygen saturation is 100% today on room air.  She has occasional nausea and generalized weakness.  She tolerated the first cycle of her treatment with nivolumab fairly well.  She is here today for evaluation before starting cycle #2.   MEDICAL HISTORY: Past Medical History:  Diagnosis Date  . Adenocarcinoma of right lung, stage 4 ([place]) 2017   lump nodes  . Anemia   .  Arthritis   . Bone metastases ([place]) 02/24/2017  . Encounter for antineoplastic chemotherapy 03/17/2016  . Hypercholesterolemia   . Hypertension 06/18/2016   just when I go to the hospital   . Osteopenia   . 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 Pneumonia   . Shortness of breath dyspnea   . 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  MEDICATIONS:  Current Outpatient Medications  Medication Sig Dispense Refill  . acetaminophen (TYLENOL) 500 MG tablet Take 1,000 mg by mouth every 6 (six) hours as needed for moderate pain or mild pain.    . Cholecalciferol (VITAMIN D3 PO) Take 1,200 mg by mouth daily.    Marland Kitchen dronabinol (MARINOL) 2.5 MG capsule Take 1 capsule (2.5 mg total) by mouth 2 (two) times daily before a meal. 60 capsule 0  . Ferrous Sulfate (IRON PO) Take 352 mg by mouth daily.    . folic acid (FOLVITE) 1 MG tablet Take 1 tablet (1 mg total) by mouth daily. 90 tablet 0  . Glycerin-Hypromellose-PEG 400 (DRY EYE RELIEF DROPS) 0.2-0.2-1 % SOLN Place 1 drop into both eyes 2 (two) times daily.    Marland Kitchen lidocaine-prilocaine (EMLA) cream Apply 1 application topically daily as needed (every four weeks).    . Omega-3 1000 MG CAPS Take 1,000 mg by mouth daily as needed (sore throat).    Marland Kitchen OVER THE COUNTER MEDICATION Apply 1 application topically daily as  needed (pain). amish origins    . traMADol (ULTRAM) 50 MG tablet Take 1 tablet (50 mg total) by mouth every 6 (six) hours as needed for moderate pain or severe pain. 28 tablet 0   No current facility-administered medications for this visit.    SURGICAL HISTORY:  Past Surgical History:  Procedure Laterality Date  . CESAREAN SECTION     myomectomy  . CHEST TUBE INSERTION Right 08/08/2020   Procedure: INSERTION PLEURAL DRAINAGE CATHETER;  Surgeon: Melrose Nakayama, MD;  Location: [facility];  Service: Thoracic;  Laterality: Right;  . COLONOSCOPY W/ POLYPECTOMY    . IR GENERIC HISTORICAL  08/17/2016    IR US GUIDE VASC ACCESS RIGHT 08/17/2016 [name], MD [facility]  . IR GENERIC HISTORICAL  08/17/2016   IR FLUORO GUIDE PORT INSERTION RIGHT 08/17/2016 [name], MD [facility]  . MEDIASTINOSCOPY N/A 09/25/2015   Procedure: MEDIASTINOSCOPY;  Surgeon: Melrose Nakayama, MD;  Location: [facility];  Service: Thoracic;  Laterality: N/A;  . VIDEO BRONCHOSCOPY Bilateral 08/19/2015   Procedure: VIDEO BRONCHOSCOPY WITH FLUORO;  Surgeon: Rigoberto Noel, MD;  Location: [facility];  Service: Cardiopulmonary;  Laterality: Bilateral;  . VIDEO BRONCHOSCOPY N/A 09/08/2015   Procedure: VIDEO BRONCHOSCOPY WITH FLUORO;  Surgeon: Rigoberto Noel, MD;  Location: [facility];  Service: Thoracic;  Laterality: N/A;  . VIDEO BRONCHOSCOPY WITH ENDOBRONCHIAL ULTRASOUND Right 09/08/2015   Procedure: ATTEMPTED VIDEO BRONCHOSCOPY ENDOBRONCHIAL ULTRASOUND  ;  Surgeon: Rigoberto Noel, MD;  Location: [facility];  Service: Thoracic;  Laterality: Right;    REVIEW OF SYSTEMS:  Constitutional: positive for anorexia and fatigue Eyes: negative Ears, nose, mouth, throat, and face: negative Respiratory: positive for dyspnea on exertion Cardiovascular: negative Gastrointestinal: positive for nausea Genitourinary:negative Integument/breast: negative Hematologic/lymphatic: negative Musculoskeletal:positive for arthralgias, back pain and muscle weakness Neurological: negative Behavioral/Psych: negative Endocrine: negative Allergic/Immunologic: negative   PHYSICAL EXAMINATION: General appearance: alert, cooperative, fatigued and no distress Head: Normocephalic, without obvious abnormality, atraumatic Neck: no adenopathy, no JVD, supple, symmetrical, trachea midline and thyroid not enlarged, symmetric, no tenderness/mass/nodules Lymph nodes: Cervical, supraclavicular, and axillary nodes normal. Resp: clear to auscultation bilaterally Back: symmetric, no curvature. ROM normal. No CVA tenderness. Cardio: regular rate and rhythm, S1,  S2 normal, no murmur, click, rub or gallop GI: soft, non-tender; bowel sounds normal; no masses,  no organomegaly Extremities: extremities normal, atraumatic, no cyanosis or edema Neurologic: Alert and oriented X 3, normal strength and tone. Normal symmetric reflexes. Normal coordination and gait  ECOG PERFORMANCE STATUS: 1 - Symptomatic but completely ambulatory  Blood pressure (!) 151/87, pulse 96, temperature 97.7 F (36.5 C), temperature source Tympanic, resp. rate 18, height '5\' 4"'  (1.626 m), weight 131 lb 11.2 oz (59.7 kg), last menstrual period 06/09/1998, SpO2 100 %.  LABORATORY DATA: Lab Results  Component Value Date   WBC 5.5 08/20/2020   HGB 10.1 (L) 08/20/2020   HCT 31.1 (L) 08/20/2020   MCV 88.1 08/20/2020   PLT 274 08/20/2020      Chemistry      Component Value Date/Time   NA 133 (L) 08/08/2020 0942   NA 137 06/09/2017 0901   K 3.9 08/08/2020 0942   K 3.9 06/09/2017 0901   CL 95 (L) 08/08/2020 0942   CO2 27 08/08/2020 0942   CO2 25 06/09/2017 0901   BUN 8 08/08/2020 0942   BUN 19.0 06/09/2017 0901   CREATININE 0.92 08/08/2020 0942   CREATININE 1.12 (H) 07/22/2020 0750   CREATININE 1.2 (H) 06/09/2017 0901  GLU 170 01/23/2016 0000      Component Value Date/Time   CALCIUM 9.5 08/08/2020 0942   CALCIUM 9.9 06/09/2017 0901   ALKPHOS 165 (H) 08/08/2020 0942   ALKPHOS 63 06/09/2017 0901   AST 29 08/08/2020 0942   AST 27 07/22/2020 0750   AST 35 (H) 06/09/2017 0901   ALT 12 08/08/2020 0942   ALT 12 07/22/2020 0750   ALT 23 06/09/2017 0901   BILITOT 0.5 08/08/2020 0942   BILITOT 0.3 07/22/2020 0750   BILITOT 0.65 06/09/2017 0901       RADIOGRAPHIC STUDIES:  DG Chest 2 View  Result Date: 08/08/2020 CLINICAL DATA:  Pre-op evaluation. Recurrent right pleural effusion. History of lung cancer. EXAM: CHEST - 2 VIEW COMPARISON:  08/04/2020 and chest CT 07/15/2020 FINDINGS: Right jugular Port-A-Cath tip is in the SVC region. Pleural based densities in the mid and  lower right chest have minimally changed. Patchy parenchymal densities in both lungs are similar to the recent comparison examination. Heart size is within normal limits and stable. Negative for pneumothorax. Known sclerotic bone lesions are poorly characterized on this examination. IMPRESSION: 1. Minimal change in the pleural based densities in the mid and lower right chest. Findings are compatible with a right pleural effusion. 2. Patchy bilateral lung densities are compatible with known pulmonary nodules and metastatic disease. Electronically Signed   By: Markus Daft M.D.   On: 08/08/2020 10:48   DG Chest 2 View  Result Date: 08/04/2020 CLINICAL DATA:  Status post thoracentesis. EXAM: CHEST - 2 VIEW COMPARISON:  Two-view chest earlier today FINDINGS: Patient has RIGHT-sided PowerPort, tip overlying the superior vena cava. Cardiac margins are partially obscured. RIGHT pleural effusion is smaller compared to prior study. There are patchy lung opacities bilaterally, more confluent at the RIGHT lung base. There is no pneumothorax following thoracentesis. IMPRESSION: Smaller RIGHT pleural effusion. No pneumothorax. Electronically Signed   By: Nolon Nations M.D.   On: 08/04/2020 19:05   DG Chest 2 View  Result Date: 08/04/2020 CLINICAL DATA:  Shortness of breath, cough EXAM: CHEST - 2 VIEW COMPARISON:  07/11/2020 FINDINGS: Right Port-A-Cath in place with the tip at the cavoatrial junction, unchanged. Moderate to large right pleural effusion. Small left pleural effusion. Diffuse airspace disease throughout the right lung. Previously seen right lung base mass not well visualized by plain film. Patchy opacities in the mid and lower left lung. IMPRESSION: Moderate to large right effusion and small left effusion. Diffuse right lung airspace disease and patchy opacities throughout the left lung. Electronically Signed   By: Rolm Baptise M.D.   On: 08/04/2020 16:37   DG C-Arm 1-60 Min-No Report  Result Date:  08/08/2020 Fluoroscopy was utilized by the requesting physician.  No radiographic interpretation.    ASSESSMENT AND PLAN:  This is a very pleasant 75 years old Hispanic female with metastatic non-small cell lung cancer, adenocarcinoma diagnosed in March 2017 status post induction systemic chemotherapy with carboplatin and Alimta and she is currently on maintenance treatment with single agent Alimta status post 73 cycles. The patient has been tolerating this treatment well but recently she started having more increasing fatigue and weakness as well as shortness of breath.  She had repeat CT scan of the chest, abdomen pelvis performed recently.  I personally and independently reviewed the scan images and discussed the results with the patient and her brother. Unfortunately her scan showed evidence for disease progression with increase in pleural effusion especially on the right side as well as  enlargement of many of the lung nodules and masses. She is currently undergoing second line treatment with immunotherapy with nivolumab 480 mg IV every 4 weeks status post 1 cycle.  She tolerated the first cycle of her treatment well except for the fatigue and the persistent shortness of breath. I recommended for the patient to delay the start of cycle number 2 by 1 week until improvement of her symptoms.  In the interval I will start the patient on a Medrol Dosepak to see if it will make a difference in her breathing. For the lack of appetite I started the patient on Marinol but she discontinued it because of lack of efficacy. For the metastatic bone disease she will continue her treatment with Zometa every 12 weeks. For the chronic back pain, she is currently on gabapentin and Mobic as needed. The patient will come back for follow-up visit in 1 week for evaluation before resuming her treatment. She was advised to call immediately if she has any other concerning symptoms in the interval.  The patient voices  understanding of current disease status and treatment options and is in agreement with the current care plan. All questions were answered. The patient knows to call the clinic with any problems, questions or concerns. We can certainly see the patient much sooner if necessary.  Disclaimer: This note was dictated with voice recognition software. Similar sounding words can inadvertently be transcribed and may not be corrected upon review.

## 2020-08-20 NOTE — Addendum Note (Signed)
Addended by: Ardeen Garland on: 08/20/2020 09:42 AM   Modules accepted: Orders

## 2020-08-20 NOTE — Progress Notes (Signed)
Nutrition  RD planning to see patient in infusion today but infusion cancelled.   Halsey Persaud B. Zenia Resides, [place], [place] Registered Dietitian [phone] (mobile)

## 2020-08-20 NOTE — Patient Instructions (Signed)

## 2020-08-21 DIAGNOSIS — Z438 Encounter for attention to other artificial openings: Secondary | ICD-10-CM | POA: Diagnosis not present

## 2020-08-21 DIAGNOSIS — C3491 Malignant neoplasm of unspecified part of right bronchus or lung: Secondary | ICD-10-CM | POA: Diagnosis not present

## 2020-08-21 DIAGNOSIS — D630 Anemia in neoplastic disease: Secondary | ICD-10-CM | POA: Diagnosis not present

## 2020-08-21 DIAGNOSIS — C7951 Secondary malignant neoplasm of bone: Secondary | ICD-10-CM | POA: Diagnosis not present

## 2020-08-21 DIAGNOSIS — I10 Essential (primary) hypertension: Secondary | ICD-10-CM | POA: Diagnosis not present

## 2020-08-21 DIAGNOSIS — J910 Malignant pleural effusion: Secondary | ICD-10-CM | POA: Diagnosis not present

## 2020-08-22 NOTE — Progress Notes (Signed)
Livengood, MD [address] [place] [address]  DIAGNOSIS: Stage IV (T1b, N2, M1b) non-small cell lung cancer, adenocarcinoma diagnosed in March 2017 and presented with right lower lobe lung nodule in addition to mediastinal lymphadenopathy and metastatic bone lesions.   Molecular studies:PDL 1 TPS <1%.   [employer] Studies: Positive for ERBB2 [id number]. Negative for EGFR, KRAS, ALK, BRAF, MET, RET and ROS1.  PRIOR THERAPY: 1) Induction systemic chemotherapy with carboplatin for AUC of 5 and Alimta 500 MG/M2 every 3 weeks is status post 6 cycles at the [facility], last dose was given 03/05/2016 with stable disease. 2) palliative radiation to the metastatic bone disease in the lower back and pelvic area 3)Maintenance systemic chemotherapy with Alimta 500 MG/M2 every 3 weeks status post 73 cycles. First dose was given 03/25/2016.Last dose was givenJanuary 10, 2022.  CURRENT THERAPY:  1)Second line treatment with immunotherapy with nivolumab 480 IV every 4 weeks. First dose July 22, 2020. Status post 1 cycle. 2) Zometa 4 mg IV every 12 week for metastatic bone disease.  INTERVAL HISTORY: Valerie Long 75 y.o. female returns to the clinic today for a follow up visit accompanied by her brother.  She was last seen in the clinic last week and was complaining of significant shortness of breath despite her oxygen saturation being 100%.  The patient recently had a Pleurx catheter placed for recurrent right pleural effusions.  For which she is draining 50 every 3 day. Her next appointment with CT surgery is on 09/16/20. Her oxygen is still 100% today but she only notes slight improvement in her breathing since last week and feels like she is "suffocating".  At her last appointment she was given a Medrol Dosepak which slight improved her symptoms/energy.  Dr. Julien Nordmann recommended delaying cycle number 2 x  1 week until further improvement in her condition. She did not have any appreciable side effects after her first round of immunotherapy.   Since her appointment last week, she still has a cough. She also still has nausea/vomiting and she sometimes takes her anti-emetics. She continues to have chronic back pain, with degenerative disk disease as well as persistent bone metastases which she relieved palliative radiotherapy to these spots in the past. She denies bowel/bladder incontinence or retention. Denies any headache or visual changes.  Denies any rashes or skin changes.  The patient is here today for evaluation and consideration of starting cycle #2.  MEDICAL HISTORY: Past Medical History:  Diagnosis Date  . Adenocarcinoma of right lung, stage 4 ([place]) 2017   lump nodes  . Anemia   . Arthritis   . Bone metastases ([place]) 02/24/2017  . Encounter for antineoplastic chemotherapy 03/17/2016  . Hypercholesterolemia   . Hypertension 06/18/2016   just when I go to the hospital   . Osteopenia   . 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 Pneumonia   . Shortness of breath dyspnea   . 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  MEDICATIONS:  Current Outpatient Medications  Medication Sig Dispense Refill  . acetaminophen (TYLENOL) 500 MG tablet Take 1,000 mg by mouth every 6 (six) hours as needed for moderate pain or mild pain.    . Cholecalciferol (VITAMIN D3 PO) Take 1,200 mg by mouth daily.    Marland Kitchen dronabinol (MARINOL) 2.5 MG capsule Take 1 capsule (2.5  mg total) by mouth 2 (two) times daily before a meal. 60 capsule 0  . Ferrous Sulfate (IRON PO) Take 352 mg by mouth daily.    . Glycerin-Hypromellose-PEG 400 (DRY EYE RELIEF DROPS) 0.2-0.2-1 % SOLN Place 1 drop into both eyes 2 (two) times daily.    Marland Kitchen lidocaine-prilocaine (EMLA) cream Apply 1 application topically daily as needed (every four weeks).    . methylPREDNISolone (MEDROL DOSEPAK) 4 MG TBPK  tablet Use as instructed. 21 tablet 0  . Omega-3 1000 MG CAPS Take 1,000 mg by mouth daily as needed (sore throat).    Marland Kitchen OVER THE COUNTER MEDICATION Apply 1 application topically daily as needed (pain). amish origins    . prochlorperazine (COMPAZINE) 10 MG tablet Take 1 tablet (10 mg total) by mouth every 6 (six) hours as needed for nausea or vomiting. 30 tablet 0  . traMADol (ULTRAM) 50 MG tablet Take 1 tablet (50 mg total) by mouth every 6 (six) hours as needed for moderate pain or severe pain. 28 tablet 0   Current Facility-Administered Medications  Medication Dose Route Frequency Provider Last Rate Last Admin  . 0.9 %  sodium chloride infusion   Intravenous Once Curt Bears, MD        SURGICAL HISTORY:  Past Surgical History:  Procedure Laterality Date  . CESAREAN SECTION     myomectomy  . CHEST TUBE INSERTION Right 08/08/2020   Procedure: INSERTION PLEURAL DRAINAGE CATHETER;  Surgeon: Melrose Nakayama, MD;  Location: [facility];  Service: Thoracic;  Laterality: Right;  . COLONOSCOPY W/ POLYPECTOMY    . IR GENERIC HISTORICAL  08/17/2016   IR US GUIDE VASC ACCESS RIGHT 08/17/2016 [name], MD [facility]  . IR GENERIC HISTORICAL  08/17/2016   IR FLUORO GUIDE PORT INSERTION RIGHT 08/17/2016 [name], MD [facility]  . MEDIASTINOSCOPY N/A 09/25/2015   Procedure: MEDIASTINOSCOPY;  Surgeon: Melrose Nakayama, MD;  Location: [facility];  Service: Thoracic;  Laterality: N/A;  . VIDEO BRONCHOSCOPY Bilateral 08/19/2015   Procedure: VIDEO BRONCHOSCOPY WITH FLUORO;  Surgeon: Rigoberto [place], MD;  Location: [facility];  Service: Cardiopulmonary;  Laterality: Bilateral;  . VIDEO BRONCHOSCOPY N/A 09/08/2015   Procedure: VIDEO BRONCHOSCOPY WITH FLUORO;  Surgeon: Rigoberto [place], MD;  Location: [facility];  Service: Thoracic;  Laterality: N/A;  . VIDEO BRONCHOSCOPY WITH ENDOBRONCHIAL ULTRASOUND Right 09/08/2015   Procedure: ATTEMPTED VIDEO BRONCHOSCOPY ENDOBRONCHIAL ULTRASOUND  ;  Surgeon:  Rigoberto Noel, MD;  Location: [facility];  Service: Thoracic;  Laterality: Right;    REVIEW OF SYSTEMS:   Review of Systems  Constitutional: Positive for fatigue, decreased appetite, and weight loss. Negative for  chills, fatigue, fever and unexpected weight change.  HENT: Negative for mouth sores, nosebleeds, sore throat and trouble swallowing.   Eyes: Negative for eye problems and icterus.  Respiratory: Positive for cough and shortness of breath. Negative for hemoptysis and wheezing.   Cardiovascular: Positive for baseline lower extremity bilateral swelling. Negative for chest pain. Gastrointestinal: Positive for occasional nausea/vomiting. Negative for abdominal pain, constipation, and diarrhea. Genitourinary: Negative for bladder incontinence, difficulty urinating, dysuria, frequency and hematuria.   Musculoskeletal: Positive for chronic back pain. Negative for gait problem, neck pain and neck stiffness.  Skin: Negative for itching and rash.  Neurological: Negative for dizziness, extremity weakness, gait problem, headaches, light-headedness and seizures.  Hematological: Negative for adenopathy. Does not bruise/bleed easily.  Psychiatric/Behavioral: Negative for confusion, depression and sleep disturbance. The patient is not nervous/anxious.     PHYSICAL  EXAMINATION:  Blood pressure (!) 153/86, pulse 98, temperature 97.6 F (36.4 C), temperature source Tympanic, resp. rate 14, height '5\' 4"'  (1.626 m), weight 128 lb 6.4 oz (58.2 kg), last menstrual period 06/09/1998, SpO2 100 %.  ECOG PERFORMANCE STATUS: 2-3  Physical Exam  Constitutional: Oriented to person, place, and time and well-developed, well-nourished, and in no distress.  HENT:  Head: Normocephalic and atraumatic.  Mouth/Throat: Oropharynx is clear and moist. No oropharyngeal exudate.  Eyes: Conjunctivae are normal. Right eye exhibits no discharge. Left eye exhibits no discharge. No scleral icterus.  Neck: Normal range of motion.  Neck supple.  Cardiovascular: Normal rate, regular rhythm, normal heart sounds and intact distal pulses.   Pulmonary/Chest: Effort normal. Decreased breath sounds in right lung. No respiratory distress. No wheezes. No rales.  Abdominal: Soft. Bowel sounds are normal. Exhibits no distension and no mass. There is no tenderness.  Musculoskeletal: Normal range of motion. Positive for bilateral lower extremity swelling.  Lymphadenopathy:    No cervical adenopathy.  Neurological: Alert and oriented to person, place, and time. Exhibits normal muscle tone. Examined in the wheelchair.  Skin: Skin is warm and dry. No rash noted. Not diaphoretic. No erythema. No pallor.  Psychiatric: Mood, memory and judgment normal.  Vitals reviewed.  LABORATORY DATA: Lab Results  Component Value Date   WBC 6.1 08/27/2020   HGB 10.8 (L) 08/27/2020   HCT 34.8 (L) 08/27/2020   MCV 89.2 08/27/2020   PLT 294 08/27/2020      Chemistry      Component Value Date/Time   NA 132 (L) 08/20/2020 0841   NA 137 06/09/2017 0901   K 4.3 08/20/2020 0841   K 3.9 06/09/2017 0901   CL 99 08/20/2020 0841   CO2 29 08/20/2020 0841   CO2 25 06/09/2017 0901   BUN 12 08/20/2020 0841   BUN 19.0 06/09/2017 0901   CREATININE 0.81 08/20/2020 0841   CREATININE 1.2 (H) 06/09/2017 0901   GLU 170 01/23/2016 0000      Component Value Date/Time   CALCIUM 9.4 08/20/2020 0841   CALCIUM 9.9 06/09/2017 0901   ALKPHOS 188 (H) 08/20/2020 0841   ALKPHOS 63 06/09/2017 0901   AST 23 08/20/2020 0841   AST 35 (H) 06/09/2017 0901   ALT 10 08/20/2020 0841   ALT 23 06/09/2017 0901   BILITOT 0.3 08/20/2020 0841   BILITOT 0.65 06/09/2017 0901       RADIOGRAPHIC STUDIES:  DG Chest 2 View  Result Date: 08/08/2020 CLINICAL DATA:  Pre-op evaluation. Recurrent right pleural effusion. History of lung cancer. EXAM: CHEST - 2 VIEW COMPARISON:  08/04/2020 and chest CT 07/15/2020 FINDINGS: Right jugular Port-A-Cath tip is in the SVC region. Pleural  based densities in the mid and lower right chest have minimally changed. Patchy parenchymal densities in both lungs are similar to the recent comparison examination. Heart size is within normal limits and stable. Negative for pneumothorax. Known sclerotic bone lesions are poorly characterized on this examination. IMPRESSION: 1. Minimal change in the pleural based densities in the mid and lower right chest. Findings are compatible with a right pleural effusion. 2. Patchy bilateral lung densities are compatible with known pulmonary nodules and metastatic disease. Electronically Signed   By: Markus Daft M.D.   On: 08/08/2020 10:48   DG Chest 2 View  Result Date: 08/04/2020 CLINICAL DATA:  Status post thoracentesis. EXAM: CHEST - 2 VIEW COMPARISON:  Two-view chest earlier today FINDINGS: Patient has RIGHT-sided PowerPort, tip overlying the superior vena  cava. Cardiac margins are partially obscured. RIGHT pleural effusion is smaller compared to prior study. There are patchy lung opacities bilaterally, more confluent at the RIGHT lung base. There is no pneumothorax following thoracentesis. IMPRESSION: Smaller RIGHT pleural effusion. No pneumothorax. Electronically Signed   By: Nolon Nations M.D.   On: 08/04/2020 19:05   DG Chest 2 View  Result Date: 08/04/2020 CLINICAL DATA:  Shortness of breath, cough EXAM: CHEST - 2 VIEW COMPARISON:  07/11/2020 FINDINGS: Right Port-A-Cath in place with the tip at the cavoatrial junction, unchanged. Moderate to large right pleural effusion. Small left pleural effusion. Diffuse airspace disease throughout the right lung. Previously seen right lung base mass not well visualized by plain film. Patchy opacities in the mid and lower left lung. IMPRESSION: Moderate to large right effusion and small left effusion. Diffuse right lung airspace disease and patchy opacities throughout the left lung. Electronically Signed   By: Rolm Baptise M.D.   On: 08/04/2020 16:37   DG C-Arm 1-60  Min-No Report  Result Date: 08/08/2020 Fluoroscopy was utilized by the requesting physician.  No radiographic interpretation.     ASSESSMENT/PLAN:  This is a very pleasant 75 year old Hispanic female diagnosed with metastatic non-small cell lung cancer, adenocarcinoma. She presented with a right lower lobe lung nodule in addition to mediastinal lymphadenopathy and metastatic bone lesions. She was diagnosed in March 2017. Her PDL 1 expression is <1% and she has no actionable mutations.   She previously underwent inductiontreatmentwith systemic chemotherapy withCarboplatin and Alimta. She then was on maintenance therapy withAlimta. She is status post73cycles. The patient has been tolerating this treatment fairly well without any concerning adversesideeffects.  She then showed evidence for disease progression in Feb 2022.   She is currently undergoing second line immunotherapy with nivolumab 480 mg IV every 4 weeks. She is status post 1 cycle.   The patient was seen with Dr. Julien Nordmann today. Dr. Julien Nordmann had a lengthy discussion with the patient about her current condition. Dr. Julien Nordmann does not want to keep delaying her treatment, which she tolerated well without any adverse side effects. The patient wishes to continue with treatment today as well. She will proceed with cycle #2 today as scheduled.   Due to the shortness of breath, the patient has a pleurex catheter in. She reports 50 ml draining every 3 days or so. She has quiet breath sounds in the right lung. Oxygen saturation 100% on room air today. We will arrange for chest xray to ensure that there is no persistent effusion/blockage of pleurx, pneumonia, pneumothorax, etc to explain her shortness of breath. Her last chest xray was on 08/08/20. Her next follow up with CT surgery is on 09/16/20.  We will see her back for a follow up visit in 4 weeks for evaluation before starting cycle #3 of her treatment.   Regarding her osseous  metastases, she previously received palliative radiotherapy to these lesions in the past.   The patient was advised to call immediately if she has any concerning symptoms in the interval. The patient voices understanding of current disease status and treatment options and is in agreement with the current care plan. All questions were answered. The patient knows to call the clinic with any problems, questions or concerns. We can certainly see the patient much sooner if necessary    Orders Placed This Encounter  Procedures  . DG Chest 2 View    Standing Status:   Future    Standing Expiration Date:   08/27/2021  Order Specific Question:   Reason for Exam (SYMPTOM  OR DIAGNOSIS REQUIRED)    Answer:   Lung cancer, has pleurex, still has decreased breath sounds right lung, assess for pneumonia or persistent effusion    Order Specific Question:   Preferred imaging location?    Answer:   [facility]  . Treatment conditions    Provider to add parameters.    Standing Status:   Standing    Number of Occurrences:   1    Order Specific Question:   Did you provide treatment parameters in the comments section?    Answer:   No     We spent 30-39 minutes in this encounter  Kamica Florance L Lether Tesch, PA-C 08/27/20   ADDENDUM: Hematology/Oncology Attending: I had a face-to-face encounter with the patient today.  I reviewed her record and recommended her care plan.  This is a very pleasant 75 years old Hispanic female with history of stage IV non-small cell lung cancer initially treated with 6 cycles of systemic chemotherapy with carboplatin and Alimta followed by maintenance treatment with single agent Alimta status post 73 cycles.  She has been tolerating the treatment well but recently she started having increasing fatigue and weakness as well as some evidence for disease progression. I recommended for the patient to discontinue her treatment with maintenance Alimta and we started her on  second line treatment with immunotherapy with nivolumab 480 mg IV every 4 weeks status post 1 cycle.  She was supposed to start cycle #2 last week but the patient has been complaining of increasing fatigue and weakness as well as shortness of breath.  She had a right pleural catheter placed by Dr. Roxan Hockey and the drainage is down to 50 mL daily. I recommended for the patient to proceed with cycle #2 of her treatment with immunotherapy today. We will check chest x-ray today for evaluation of the right pleural effusion and if needed will refer the patient back to Dr. Roxan Hockey for management. She will come back for follow-up visit in 4 weeks for evaluation before the next cycle of her treatment. The patient was advised to call immediately if she has any other concerning symptoms in the interval. The total time spent in the appointment was 40 minutes.  Disclaimer: This note was dictated with voice recognition software. Similar sounding words can inadvertently be transcribed and may be missed upon review. Eilleen Kempf, MD 08/27/20

## 2020-08-25 ENCOUNTER — Telehealth: Payer: Self-pay

## 2020-08-25 ENCOUNTER — Other Ambulatory Visit: Payer: Self-pay | Admitting: *Deleted

## 2020-08-25 DIAGNOSIS — R600 Localized edema: Secondary | ICD-10-CM

## 2020-08-25 DIAGNOSIS — M8580 Other specified disorders of bone density and structure, unspecified site: Secondary | ICD-10-CM | POA: Diagnosis not present

## 2020-08-25 DIAGNOSIS — J910 Malignant pleural effusion: Secondary | ICD-10-CM

## 2020-08-25 DIAGNOSIS — Z438 Encounter for attention to other artificial openings: Secondary | ICD-10-CM

## 2020-08-25 DIAGNOSIS — D709 Neutropenia, unspecified: Secondary | ICD-10-CM

## 2020-08-25 DIAGNOSIS — R5081 Fever presenting with conditions classified elsewhere: Secondary | ICD-10-CM

## 2020-08-25 DIAGNOSIS — M1990 Unspecified osteoarthritis, unspecified site: Secondary | ICD-10-CM

## 2020-08-25 DIAGNOSIS — I10 Essential (primary) hypertension: Secondary | ICD-10-CM

## 2020-08-25 DIAGNOSIS — C7951 Secondary malignant neoplasm of bone: Secondary | ICD-10-CM

## 2020-08-25 DIAGNOSIS — C3491 Malignant neoplasm of unspecified part of right bronchus or lung: Secondary | ICD-10-CM

## 2020-08-25 DIAGNOSIS — Z9181 History of falling: Secondary | ICD-10-CM

## 2020-08-25 DIAGNOSIS — K8020 Calculus of gallbladder without cholecystitis without obstruction: Secondary | ICD-10-CM

## 2020-08-25 DIAGNOSIS — D630 Anemia in neoplastic disease: Secondary | ICD-10-CM

## 2020-08-25 DIAGNOSIS — J90 Pleural effusion, not elsewhere classified: Secondary | ICD-10-CM

## 2020-08-25 DIAGNOSIS — I700 Atherosclerosis of aorta: Secondary | ICD-10-CM

## 2020-08-25 DIAGNOSIS — E7800 Pure hypercholesterolemia, unspecified: Secondary | ICD-10-CM

## 2020-08-25 NOTE — Telephone Encounter (Signed)
Plan of care from [facility] signed and faxed back to [phone]. Form sent for scanning.

## 2020-08-26 ENCOUNTER — Other Ambulatory Visit: Payer: Medicare Other

## 2020-08-26 ENCOUNTER — Telehealth: Payer: Self-pay

## 2020-08-26 ENCOUNTER — Ambulatory Visit: Payer: Medicare Other | Admitting: Physician Assistant

## 2020-08-26 NOTE — Telephone Encounter (Signed)
Valerie Long [facility] with [facility] contacted the office [phone] requesting orders for patient's drainage schedule.  She is s/p Pleurx placement 08/08/20 with Dr. Roxan Hockey.  She is currently draining every other day and for three consecutive days, patient has been draining less than 150 mls.  Last drainage recorded 60 mls.  Advised to start drainage every third day and to contact the office for further instructions once patient has less than 150 mls for three consecutive drains.  She acknowledged receipt.  Patient has follow-up appointment with Dr. Roxan Hockey 09/16/20, which patient is aware.

## 2020-08-27 ENCOUNTER — Inpatient Hospital Stay: Payer: Medicare Other

## 2020-08-27 ENCOUNTER — Inpatient Hospital Stay (HOSPITAL_BASED_OUTPATIENT_CLINIC_OR_DEPARTMENT_OTHER): Payer: Medicare Other | Admitting: Physician Assistant

## 2020-08-27 ENCOUNTER — Ambulatory Visit (HOSPITAL_COMMUNITY)
Admission: RE | Admit: 2020-08-27 | Discharge: 2020-08-27 | Disposition: A | Discharge status: Home / Self Care | Payer: Medicare Other | Source: Ambulatory Visit | Attending: Physician Assistant | Admitting: Physician Assistant

## 2020-08-27 ENCOUNTER — Other Ambulatory Visit: Payer: Self-pay

## 2020-08-27 ENCOUNTER — Telehealth: Payer: Self-pay | Admitting: Physician Assistant

## 2020-08-27 VITALS — BP 153/86 | HR 98 | Temp 97.6°F | Resp 14 | Ht 64.0 in | Wt 128.4 lb

## 2020-08-27 DIAGNOSIS — C3491 Malignant neoplasm of unspecified part of right bronchus or lung: Secondary | ICD-10-CM

## 2020-08-27 DIAGNOSIS — Z5112 Encounter for antineoplastic immunotherapy: Secondary | ICD-10-CM | POA: Diagnosis not present

## 2020-08-27 DIAGNOSIS — I10 Essential (primary) hypertension: Secondary | ICD-10-CM | POA: Diagnosis not present

## 2020-08-27 DIAGNOSIS — C7951 Secondary malignant neoplasm of bone: Secondary | ICD-10-CM | POA: Diagnosis not present

## 2020-08-27 DIAGNOSIS — C3431 Malignant neoplasm of lower lobe, right bronchus or lung: Secondary | ICD-10-CM | POA: Diagnosis not present

## 2020-08-27 DIAGNOSIS — Z79899 Other long term (current) drug therapy: Secondary | ICD-10-CM | POA: Diagnosis not present

## 2020-08-27 DIAGNOSIS — J90 Pleural effusion, not elsewhere classified: Secondary | ICD-10-CM | POA: Diagnosis not present

## 2020-08-27 DIAGNOSIS — M8580 Other specified disorders of bone density and structure, unspecified site: Secondary | ICD-10-CM | POA: Diagnosis not present

## 2020-08-27 DIAGNOSIS — Z95828 Presence of other vascular implants and grafts: Secondary | ICD-10-CM

## 2020-08-27 LAB — CBC WITH DIFFERENTIAL (CANCER CENTER ONLY)
Abs Immature Granulocytes: 0.04 10*3/uL (ref 0.00–0.07)
Basophils Absolute: 0 10*3/uL (ref 0.0–0.1)
Basophils Relative: 1 %
Eosinophils Absolute: 0.1 10*3/uL (ref 0.0–0.5)
Eosinophils Relative: 2 %
HCT: 34.8 % — ABNORMAL LOW (ref 36.0–46.0)
Hemoglobin: 10.8 g/dL — ABNORMAL LOW (ref 12.0–15.0)
Immature Granulocytes: 1 %
Lymphocytes Relative: 18 %
Lymphs Abs: 1.1 10*3/uL (ref 0.7–4.0)
MCH: 27.7 pg (ref 26.0–34.0)
MCHC: 31 g/dL (ref 30.0–36.0)
MCV: 89.2 fL (ref 80.0–100.0)
Monocytes Absolute: 0.6 10*3/uL (ref 0.1–1.0)
Monocytes Relative: 10 %
Neutro Abs: 4.2 10*3/uL (ref 1.7–7.7)
Neutrophils Relative %: 68 %
Platelet Count: 294 10*3/uL (ref 150–400)
RBC: 3.9 MIL/uL (ref 3.87–5.11)
RDW: 16.2 % — ABNORMAL HIGH (ref 11.5–15.5)
WBC Count: 6.1 10*3/uL (ref 4.0–10.5)
nRBC: 0 % (ref 0.0–0.2)

## 2020-08-27 LAB — CMP (CANCER CENTER ONLY)
ALT: 16 U/L (ref 0–44)
AST: 23 U/L (ref 15–41)
Albumin: 2.9 g/dL — ABNORMAL LOW (ref 3.5–5.0)
Alkaline Phosphatase: 251 U/L — ABNORMAL HIGH (ref 38–126)
Anion gap: 11 (ref 5–15)
BUN: 18 mg/dL (ref 8–23)
CO2: 27 mmol/L (ref 22–32)
Calcium: 8.9 mg/dL (ref 8.9–10.3)
Chloride: 100 mmol/L (ref 98–111)
Creatinine: 0.82 mg/dL (ref 0.44–1.00)
GFR, Estimated: >60 mL/min (ref >60)
Glucose, Bld: 88 mg/dL (ref 70–99)
Potassium: 3.9 mmol/L (ref 3.5–5.1)
Sodium: 138 mmol/L (ref 135–145)
Total Bilirubin: 0.5 mg/dL (ref 0.3–1.2)
Total Protein: 6.9 g/dL (ref 6.5–8.1)

## 2020-08-27 MED ORDER — HEPARIN SOD (PORK) LOCK FLUSH 100 UNIT/ML IV SOLN
500.0000 [IU] | Freq: Once | INTRAVENOUS | Status: AC | PRN
  Administered 2020-08-27: 500 [IU]
  Filled 2020-08-27: qty 5

## 2020-08-27 MED ORDER — SODIUM CHLORIDE 0.9 % IV SOLN
480.0000 mg | Freq: Once | INTRAVENOUS | Status: AC
  Administered 2020-08-27: 480 mg via INTRAVENOUS
  Filled 2020-08-27: qty 48

## 2020-08-27 MED ORDER — SODIUM CHLORIDE 0.9 % IV SOLN
Freq: Once | INTRAVENOUS | Status: AC
  Filled 2020-08-27: qty 250

## 2020-08-27 MED ORDER — SODIUM CHLORIDE 0.9% FLUSH
10.0000 mL | Freq: Once | INTRAVENOUS | Status: AC | PRN
  Administered 2020-08-27: 10 mL
  Filled 2020-08-27: qty 10

## 2020-08-27 MED ORDER — SODIUM CHLORIDE 0.9% FLUSH
10.0000 mL | INTRAVENOUS | Status: DC | PRN
  Administered 2020-08-27: 10 mL
  Filled 2020-08-27: qty 10

## 2020-08-27 MED ORDER — SODIUM CHLORIDE 0.9 % IV SOLN
Freq: Once | INTRAVENOUS | Status: DC
  Filled 2020-08-27: qty 250

## 2020-08-27 NOTE — Telephone Encounter (Signed)
I called the patient to review the results of her CXR which was stable. I left a voicemail with this information on her mobile number. I tried to call her home phone but received a busy signal. If she has any questions, she is free to call us back.

## 2020-08-27 NOTE — Patient Instructions (Signed)
[facility] Discharge Instructions for Patients Receiving Chemotherapy  Today you received the following chemotherapy agents Opdivo   To help prevent nausea and vomiting after your treatment, we encourage you to take your nausea medication as directed.   If you develop nausea and vomiting that is not controlled by your nausea medication, call the clinic.   BELOW ARE SYMPTOMS THAT SHOULD BE REPORTED IMMEDIATELY:  *FEVER GREATER THAN 100.5 F  *CHILLS WITH OR WITHOUT FEVER  NAUSEA AND VOMITING THAT IS NOT CONTROLLED WITH YOUR NAUSEA MEDICATION  *UNUSUAL SHORTNESS OF BREATH  *UNUSUAL BRUISING OR BLEEDING  TENDERNESS IN MOUTH AND THROAT WITH OR WITHOUT PRESENCE OF ULCERS  *URINARY PROBLEMS  *BOWEL PROBLEMS  UNUSUAL RASH Items with * indicate a potential emergency and should be followed up as soon as possible.  Feel free to call the clinic should you have any questions or concerns. The clinic phone number is [phone].  Please show the [facility] at check-in to the Emergency Department and triage nurse.  Nivolumab injection What is this medicine? NIVOLUMAB (nye VOL ue mab) is a monoclonal antibody. It treats certain types of cancer. Some of the cancers treated are colon cancer, head and neck cancer, Hodgkin lymphoma, lung cancer, and melanoma. This medicine may be used for other purposes; ask your health care provider or pharmacist if you have questions. COMMON BRAND NAME(S): Opdivo What should I tell my health care provider before I take this medicine? They need to know if you have any of these conditions:  autoimmune diseases like Crohn's disease, ulcerative colitis, or lupus  have had or planning to have an allogeneic stem cell transplant (uses someone else's stem cells)  history of chest radiation  history of organ transplant  nervous system problems like myasthenia gravis or Guillain-Barre syndrome  an unusual or allergic reaction to  nivolumab, other medicines, foods, dyes, or preservatives  pregnant or trying to get pregnant  breast-feeding How should I use this medicine? This medicine is for infusion into a vein. It is given by a health care professional in a hospital or clinic setting. A special MedGuide will be given to you before each treatment. Be sure to read this information carefully each time. Talk to your pediatrician regarding the use of this medicine in children. While this drug may be prescribed for children as young as 12 years for selected conditions, precautions do apply. Overdosage: If you think you have taken too much of this medicine contact a poison control center or emergency room at once. NOTE: This medicine is only for you. Do not share this medicine with others. What if I miss a dose? It is important not to miss your dose. Call your doctor or health care professional if you are unable to keep an appointment. What may interact with this medicine? Interactions have not been studied. This list may not describe all possible interactions. Give your health care provider a list of all the medicines, herbs, non-prescription drugs, or dietary supplements you use. Also tell them if you smoke, drink alcohol, or use illegal drugs. Some items may interact with your medicine. What should I watch for while using this medicine? This drug may make you feel generally unwell. Continue your course of treatment even though you feel ill unless your doctor tells you to stop. You may need blood work done while you are taking this medicine. Do not become pregnant while taking this medicine or for 5 months after stopping it. Women should inform  their doctor if they wish to become pregnant or think they might be pregnant. There is a potential for serious side effects to an unborn child. Talk to your health care professional or pharmacist for more information. Do not breast-feed an infant while taking this medicine or for 5  months after stopping it. What side effects may I notice from receiving this medicine? Side effects that you should report to your doctor or health care professional as soon as possible:  allergic reactions like skin rash, itching or hives, swelling of the face, lips, or tongue  breathing problems  blood in the urine  bloody or watery diarrhea or black, tarry stools  changes in emotions or moods  changes in vision  chest pain  cough  dizziness  feeling faint or lightheaded, falls  fever, chills  headache with fever, neck stiffness, confusion, loss of memory, sensitivity to light, hallucination, loss of contact with reality, or seizures  joint pain  mouth sores  redness, blistering, peeling or loosening of the skin, including inside the mouth  severe muscle pain or weakness  signs and symptoms of high blood sugar such as dizziness; dry mouth; dry skin; fruity breath; nausea; stomach pain; increased hunger or thirst; increased urination  signs and symptoms of kidney injury like trouble passing urine or change in the amount of urine  signs and symptoms of liver injury like dark yellow or brown urine; general ill feeling or flu-like symptoms; light-colored stools; loss of appetite; nausea; right upper belly pain; unusually weak or tired; yellowing of the eyes or skin  swelling of the ankles, feet, hands  trouble passing urine or change in the amount of urine  unusually weak or tired  weight gain or loss Side effects that usually do not require medical attention (report to your doctor or health care professional if they continue or are bothersome):  bone pain  constipation  decreased appetite  diarrhea  muscle pain  nausea, vomiting  tiredness This list may not describe all possible side effects. Call your doctor for medical advice about side effects. You may report side effects to FDA at 1-800-FDA-1088. Where should I keep my medicine? This drug is given  in a hospital or clinic and will not be stored at home. NOTE: This sheet is a summary. It may not cover all possible information. If you have questions about this medicine, talk to your doctor, pharmacist, or health care provider.  2021 Elsevier/Gold Standard (2019-09-26 10:08:25)

## 2020-08-28 DIAGNOSIS — I10 Essential (primary) hypertension: Secondary | ICD-10-CM | POA: Diagnosis not present

## 2020-08-28 DIAGNOSIS — D630 Anemia in neoplastic disease: Secondary | ICD-10-CM | POA: Diagnosis not present

## 2020-08-28 DIAGNOSIS — C7951 Secondary malignant neoplasm of bone: Secondary | ICD-10-CM | POA: Diagnosis not present

## 2020-08-28 DIAGNOSIS — Z438 Encounter for attention to other artificial openings: Secondary | ICD-10-CM | POA: Diagnosis not present

## 2020-08-28 DIAGNOSIS — J910 Malignant pleural effusion: Secondary | ICD-10-CM | POA: Diagnosis not present

## 2020-08-28 DIAGNOSIS — C3491 Malignant neoplasm of unspecified part of right bronchus or lung: Secondary | ICD-10-CM | POA: Diagnosis not present

## 2020-09-01 ENCOUNTER — Telehealth: Payer: Self-pay | Admitting: Medical Oncology

## 2020-09-01 NOTE — Telephone Encounter (Signed)
"   I am suffocating . I take two steps and I cannot get my breath. I have to sit all the time". Her breathing was not better after 300 ml was removed from her pleurix yesterday.  Alvis Lemmings RN , Langley Gauss will see pt wed .   Pt reportedly had fever yesterday of 100.9. She denies fever today.. Pt instructed to go to ED for evaluation. She said " I'll wait for now".

## 2020-09-02 ENCOUNTER — Other Ambulatory Visit: Payer: Self-pay | Admitting: Medical Oncology

## 2020-09-02 ENCOUNTER — Telehealth: Payer: Self-pay | Admitting: Medical Oncology

## 2020-09-02 DIAGNOSIS — C3491 Malignant neoplasm of unspecified part of right bronchus or lung: Secondary | ICD-10-CM

## 2020-09-02 NOTE — Telephone Encounter (Signed)
Pt is not sleeping and is very uncomfortable in her bed or the chair because she is not able to get comfortable in a position that help with her breathing  And her bone pain - "Diffuse skeletal metastatic disease with similar distribution ..."  Hospital bed ordered for [facility] to sign and send to [employer] .

## 2020-09-03 ENCOUNTER — Telehealth: Payer: Self-pay | Admitting: Medical Oncology

## 2020-09-03 DIAGNOSIS — Z438 Encounter for attention to other artificial openings: Secondary | ICD-10-CM | POA: Diagnosis not present

## 2020-09-03 DIAGNOSIS — J910 Malignant pleural effusion: Secondary | ICD-10-CM | POA: Diagnosis not present

## 2020-09-03 DIAGNOSIS — C3491 Malignant neoplasm of unspecified part of right bronchus or lung: Secondary | ICD-10-CM | POA: Diagnosis not present

## 2020-09-03 DIAGNOSIS — C7951 Secondary malignant neoplasm of bone: Secondary | ICD-10-CM | POA: Diagnosis not present

## 2020-09-03 DIAGNOSIS — D630 Anemia in neoplastic disease: Secondary | ICD-10-CM | POA: Diagnosis not present

## 2020-09-03 DIAGNOSIS — I10 Essential (primary) hypertension: Secondary | ICD-10-CM | POA: Diagnosis not present

## 2020-09-03 NOTE — Telephone Encounter (Signed)
LVM for Zack to call me re orders for hospital bed.

## 2020-09-04 ENCOUNTER — Other Ambulatory Visit: Payer: Self-pay | Admitting: Medical Oncology

## 2020-09-04 ENCOUNTER — Telehealth: Payer: Self-pay

## 2020-09-04 NOTE — Progress Notes (Signed)
   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

## 2020-09-04 NOTE — Telephone Encounter (Signed)
Message from [place], [place] with [facility] indicating pt is c/o SOB and the SOB is unchanged with pluerx drainage.   Discussed with Cassandra PA-C. Pt was seen in the office 08/27/20 with these same concerns. A cxr was done and the xray was stable.  Pt has a follow-up appt with Dr. Roxan Hockey on 09/16/20 with a cxr. I spoke with pts sister, Valerie Long, and advised her to contact Dr. Leonarda Salon office to determine if they want to see the pt sooner or move her cxr to a sooner date. Valerie Long was also advised if pts SOB has worsened, please go to the ER. Valerie Long expressed understanding of this information.

## 2020-09-08 DIAGNOSIS — E7800 Pure hypercholesterolemia, unspecified: Secondary | ICD-10-CM | POA: Diagnosis not present

## 2020-09-08 DIAGNOSIS — K8020 Calculus of gallbladder without cholecystitis without obstruction: Secondary | ICD-10-CM | POA: Diagnosis not present

## 2020-09-08 DIAGNOSIS — J910 Malignant pleural effusion: Secondary | ICD-10-CM | POA: Diagnosis not present

## 2020-09-08 DIAGNOSIS — I700 Atherosclerosis of aorta: Secondary | ICD-10-CM | POA: Diagnosis not present

## 2020-09-08 DIAGNOSIS — D630 Anemia in neoplastic disease: Secondary | ICD-10-CM | POA: Diagnosis not present

## 2020-09-08 DIAGNOSIS — M8580 Other specified disorders of bone density and structure, unspecified site: Secondary | ICD-10-CM | POA: Diagnosis not present

## 2020-09-08 DIAGNOSIS — R600 Localized edema: Secondary | ICD-10-CM | POA: Diagnosis not present

## 2020-09-08 DIAGNOSIS — Z9181 History of falling: Secondary | ICD-10-CM | POA: Diagnosis not present

## 2020-09-08 DIAGNOSIS — D709 Neutropenia, unspecified: Secondary | ICD-10-CM | POA: Diagnosis not present

## 2020-09-08 DIAGNOSIS — C3491 Malignant neoplasm of unspecified part of right bronchus or lung: Secondary | ICD-10-CM | POA: Diagnosis not present

## 2020-09-08 DIAGNOSIS — R5081 Fever presenting with conditions classified elsewhere: Secondary | ICD-10-CM | POA: Diagnosis not present

## 2020-09-08 DIAGNOSIS — Z438 Encounter for attention to other artificial openings: Secondary | ICD-10-CM | POA: Diagnosis not present

## 2020-09-08 DIAGNOSIS — I10 Essential (primary) hypertension: Secondary | ICD-10-CM | POA: Diagnosis not present

## 2020-09-08 DIAGNOSIS — C7951 Secondary malignant neoplasm of bone: Secondary | ICD-10-CM | POA: Diagnosis not present

## 2020-09-08 DIAGNOSIS — M1990 Unspecified osteoarthritis, unspecified site: Secondary | ICD-10-CM | POA: Diagnosis not present

## 2020-09-09 ENCOUNTER — Encounter (HOSPITAL_COMMUNITY): Payer: Self-pay | Admitting: Pharmacy Technician

## 2020-09-09 ENCOUNTER — Other Ambulatory Visit: Payer: Self-pay

## 2020-09-09 ENCOUNTER — Emergency Department (HOSPITAL_COMMUNITY): Payer: Medicare Other

## 2020-09-09 ENCOUNTER — Ambulatory Visit (INDEPENDENT_AMBULATORY_CARE_PROVIDER_SITE_OTHER): Payer: Medicare Other | Admitting: Thoracic Surgery (Cardiothoracic Vascular Surgery)

## 2020-09-09 ENCOUNTER — Emergency Department (HOSPITAL_COMMUNITY)
Admission: EM | Admit: 2020-09-09 | Discharge: 2020-09-10 | Disposition: A | Discharge status: Home / Self Care | Payer: Medicare Other | Attending: Emergency Medicine | Admitting: Emergency Medicine

## 2020-09-09 ENCOUNTER — Other Ambulatory Visit: Payer: Self-pay | Admitting: *Deleted

## 2020-09-09 ENCOUNTER — Ambulatory Visit
Admission: RE | Admit: 2020-09-09 | Discharge: 2020-09-09 | Disposition: A | Discharge status: Home / Self Care | Payer: Medicare Other | Source: Ambulatory Visit | Attending: Thoracic Surgery (Cardiothoracic Vascular Surgery) | Admitting: Thoracic Surgery (Cardiothoracic Vascular Surgery)

## 2020-09-09 VITALS — BP 170/100 | HR 100 | Resp 20 | Ht 64.0 in | Wt 126.0 lb

## 2020-09-09 DIAGNOSIS — J90 Pleural effusion, not elsewhere classified: Secondary | ICD-10-CM | POA: Insufficient documentation

## 2020-09-09 DIAGNOSIS — Z85118 Personal history of other malignant neoplasm of bronchus and lung: Secondary | ICD-10-CM | POA: Diagnosis not present

## 2020-09-09 DIAGNOSIS — R000 Tachycardia, unspecified: Secondary | ICD-10-CM | POA: Diagnosis not present

## 2020-09-09 DIAGNOSIS — R079 Chest pain, unspecified: Secondary | ICD-10-CM

## 2020-09-09 DIAGNOSIS — R0602 Shortness of breath: Secondary | ICD-10-CM | POA: Diagnosis not present

## 2020-09-09 DIAGNOSIS — R1013 Epigastric pain: Secondary | ICD-10-CM | POA: Insufficient documentation

## 2020-09-09 DIAGNOSIS — Z452 Encounter for adjustment and management of vascular access device: Secondary | ICD-10-CM | POA: Diagnosis not present

## 2020-09-09 DIAGNOSIS — R55 Syncope and collapse: Secondary | ICD-10-CM | POA: Diagnosis not present

## 2020-09-09 DIAGNOSIS — Z20822 Contact with and (suspected) exposure to covid-19: Secondary | ICD-10-CM | POA: Insufficient documentation

## 2020-09-09 DIAGNOSIS — I2699 Other pulmonary embolism without acute cor pulmonale: Secondary | ICD-10-CM

## 2020-09-09 DIAGNOSIS — I10 Essential (primary) hypertension: Secondary | ICD-10-CM | POA: Insufficient documentation

## 2020-09-09 DIAGNOSIS — M542 Cervicalgia: Secondary | ICD-10-CM | POA: Diagnosis not present

## 2020-09-09 DIAGNOSIS — K8020 Calculus of gallbladder without cholecystitis without obstruction: Secondary | ICD-10-CM | POA: Diagnosis not present

## 2020-09-09 DIAGNOSIS — R918 Other nonspecific abnormal finding of lung field: Secondary | ICD-10-CM | POA: Diagnosis not present

## 2020-09-09 LAB — LIPASE, BLOOD: Lipase: 31 U/L (ref 11–51)

## 2020-09-09 LAB — CBC WITH DIFFERENTIAL/PLATELET
Abs Immature Granulocytes: 0.02 10*3/uL (ref 0.00–0.07)
Basophils Absolute: 0 10*3/uL (ref 0.0–0.1)
Basophils Relative: 0 %
Eosinophils Absolute: 0 10*3/uL (ref 0.0–0.5)
Eosinophils Relative: 1 %
HCT: 35.9 % — ABNORMAL LOW (ref 36.0–46.0)
Hemoglobin: 11.2 g/dL — ABNORMAL LOW (ref 12.0–15.0)
Immature Granulocytes: 0 %
Lymphocytes Relative: 9 %
Lymphs Abs: 0.6 10*3/uL — ABNORMAL LOW (ref 0.7–4.0)
MCH: 27.9 pg (ref 26.0–34.0)
MCHC: 31.2 g/dL (ref 30.0–36.0)
MCV: 89.5 fL (ref 80.0–100.0)
Monocytes Absolute: 0.6 10*3/uL (ref 0.1–1.0)
Monocytes Relative: 9 %
Neutro Abs: 5.2 10*3/uL (ref 1.7–7.7)
Neutrophils Relative %: 81 %
Platelets: 285 10*3/uL (ref 150–400)
RBC: 4.01 MIL/uL (ref 3.87–5.11)
RDW: 16.6 % — ABNORMAL HIGH (ref 11.5–15.5)
WBC: 6.4 10*3/uL (ref 4.0–10.5)
nRBC: 0 % (ref 0.0–0.2)

## 2020-09-09 LAB — COMPREHENSIVE METABOLIC PANEL
ALT: 12 U/L (ref 0–44)
AST: 27 U/L (ref 15–41)
Albumin: 2.8 g/dL — ABNORMAL LOW (ref 3.5–5.0)
Alkaline Phosphatase: 246 U/L — ABNORMAL HIGH (ref 38–126)
Anion gap: 8 (ref 5–15)
BUN: 10 mg/dL (ref 8–23)
CO2: 29 mmol/L (ref 22–32)
Calcium: 9.3 mg/dL (ref 8.9–10.3)
Chloride: 94 mmol/L — ABNORMAL LOW (ref 98–111)
Creatinine, Ser: 0.8 mg/dL (ref 0.44–1.00)
GFR, Estimated: >60 mL/min (ref >60)
Glucose, Bld: 107 mg/dL — ABNORMAL HIGH (ref 70–99)
Potassium: 4.1 mmol/L (ref 3.5–5.1)
Sodium: 131 mmol/L — ABNORMAL LOW (ref 135–145)
Total Bilirubin: 0.6 mg/dL (ref 0.3–1.2)
Total Protein: 6.7 g/dL (ref 6.5–8.1)

## 2020-09-09 LAB — URINALYSIS, ROUTINE W REFLEX MICROSCOPIC
Bilirubin Urine: NEGATIVE
Glucose, UA: NEGATIVE mg/dL
Hgb urine dipstick: NEGATIVE
Ketones, ur: NEGATIVE mg/dL
Leukocytes,Ua: NEGATIVE
Nitrite: NEGATIVE
Protein, ur: NEGATIVE mg/dL
Specific Gravity, Urine: 1.025 (ref 1.005–1.030)
pH: 8 (ref 5.0–8.0)

## 2020-09-09 LAB — TROPONIN I (HIGH SENSITIVITY): Troponin I (High Sensitivity): 5 ng/L (ref <18)

## 2020-09-09 LAB — BRAIN NATRIURETIC PEPTIDE: B Natriuretic Peptide: 22.9 pg/mL (ref 0.0–100.0)

## 2020-09-09 MED ORDER — IOHEXOL 350 MG/ML SOLN
100.0000 mL | Freq: Once | INTRAVENOUS | Status: AC | PRN
  Administered 2020-09-09: 100 mL via INTRAVENOUS

## 2020-09-09 MED ORDER — ONDANSETRON HCL 4 MG/2ML IJ SOLN
4.0000 mg | Freq: Once | INTRAMUSCULAR | Status: AC
  Administered 2020-09-09: 4 mg via INTRAVENOUS
  Filled 2020-09-09: qty 2

## 2020-09-09 MED ORDER — HYDROXYZINE HCL 25 MG PO TABS: 50.0000 mg | ORAL_TABLET | Freq: Three times a day (TID) | ORAL | 0 refills | Status: DC | PRN

## 2020-09-09 MED ORDER — ONDANSETRON 4 MG PO TBDP: 4.0000 mg | ORAL_TABLET | Freq: Three times a day (TID) | ORAL | 0 refills | Status: DC | PRN

## 2020-09-09 MED ORDER — CYCLOBENZAPRINE HCL 10 MG PO TABS: 10.0000 mg | ORAL_TABLET | Freq: Two times a day (BID) | ORAL | 0 refills | Status: AC | PRN

## 2020-09-09 MED ORDER — FENTANYL CITRATE (PF) 100 MCG/2ML IJ SOLN
50.0000 ug | Freq: Once | INTRAMUSCULAR | Status: AC
  Administered 2020-09-09: 50 ug via INTRAVENOUS
  Filled 2020-09-09: qty 2

## 2020-09-09 MED ORDER — PANTOPRAZOLE SODIUM 20 MG PO TBEC: 40.0000 mg | DELAYED_RELEASE_TABLET | Freq: Every day | ORAL | 0 refills | Status: DC

## 2020-09-09 NOTE — ED Triage Notes (Signed)
Emergency Medicine Provider Triage Evaluation Note  Delorse Shane , a 75 y.o. female  was evaluated in triage.  Pt complains of worsening shortness of breath over the past few days. Patient seen by CT surgery today and sent to the ED due to concerns about a possible PE. Patient denies chest pain. No lower extremity edema. She has a thoracentesis performed today where 300cc fluid removed with no improved in symptoms. History of lung cancer.  Review of Systems  Positive: Abdominal pain, shortness of breath   Physical Exam  BP (!) 158/94 (BP Location: Right Arm)   Pulse (!) 105   Temp 98 F (36.7 C) (Oral)   Resp 16   LMP 06/09/1998   SpO2 99%  Gen:   Awake, no distress   HEENT:  Atraumatic  Resp:  Normal effort  Cardiac:  tachycardic Abd:   Nondistended, diffuse abdominal pain MSK:   Moves extremities without difficulty  Neuro:  Speech clear   Medical Decision Making  Medically screening exam initiated at 4:01 PM.  Appropriate orders placed.  Negar Sieler was informed that the remainder of the evaluation will be completed by another provider, this initial triage assessment does not replace that evaluation, and the importance of remaining in the ED until their evaluation is complete.  Clinical Impression  Patient presents to ED due to worsening shortness of breath and abdominal pain. History of lung cancer. Labs ordered. Patient has IV. CT abdomen and CTA chest ordered.    Suzy Bouchard, [place] 09/09/20 1605

## 2020-09-09 NOTE — ED Provider Notes (Signed)
[facility] EMERGENCY DEPARTMENT Provider Note   CSN: [id number] Arrival date & time: 09/09/20  1533     History No chief complaint on file.   Valerie Long is a 75 y.o. female.  HPI      75yo female with history of stage IV adenocarcinoma of the right lung, bone metastases, hypertension, hyperlipidemia, pleural effusion, Port-A-Cath in place, anemia, presents with concern for shortness of breath.  She had been seen by CT surgery today and was sent to the emergency department for concern for possible PE.  She denies any chest pain.  She had a thoracentesis performed today with 300 cc of fluid removed without improvement of her symptoms.  She also reports she is having worsening abdominal pain.  Reports she has had some chronic shortness of breath, however it has been progressively worsening over the last 2 weeks.  Reports she feels short of breath with minimal exertion, and fatigue.  She also reports over the last 2 days having worsening abdominal pain.  The abdominal pain is epigastric, radiating to the sides bilaterally.  Reports she has had nausea and some episodes of vomiting.  Reports she has a chronic cough associated with her cancer diagnosis, but feels like she has been too fatigued and weak to cough.  Denies known fevers, asymmetric leg swelling or pain, history of PE or DVT.   Past Medical History:  Diagnosis Date  . Adenocarcinoma of right lung, stage 4 ([place]) 2017   lump nodes  . Anemia   . Arthritis   . Bone metastases ([place]) 02/24/2017  . Encounter for antineoplastic chemotherapy 03/17/2016  . Hypercholesterolemia   . Hypertension 06/18/2016   just when I go to the hospital   . Osteopenia   . 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 Pneumonia   . Shortness of breath dyspnea   . 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 Encounter for antineoplastic immunotherapy 07/16/2020  .  Febrile neutropenia ([place]) 05/07/2020  . Pleural effusion on right 12/06/2019  . Port-A-Cath in place 01/12/2018  . Bone metastases ([place]) 02/24/2017  . 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 Hypertension 06/18/2016  . Encounter for antineoplastic chemotherapy 03/17/2016  . Adenocarcinoma of right lung, stage 4 ([place])   . PCP NOTES >>> 03/22/2015  . CTS (carpal tunnel syndrome) 12/06/2013  . Annual physical exam 08/29/2012  . Pain in joint, shoulder region 08/29/2012  . Vaginal atrophy 06/17/2011  . Osteopenia   . Hypercholesterolemia     Past Surgical History:  Procedure Laterality Date  . CESAREAN SECTION     myomectomy  . CHEST TUBE INSERTION Right 08/08/2020   Procedure: INSERTION PLEURAL DRAINAGE CATHETER;  Surgeon: Melrose Nakayama, MD;  Location: [facility];  Service: Thoracic;  Laterality: Right;  . COLONOSCOPY W/ POLYPECTOMY    . IR GENERIC HISTORICAL  08/17/2016   IR US GUIDE VASC ACCESS RIGHT 08/17/2016 [name], MD [facility]-INTERV RAD  . IR GENERIC HISTORICAL  08/17/2016   IR FLUORO GUIDE PORT INSERTION RIGHT 08/17/2016 [name], MD [facility]INTER[facility]  . MEDIASTINOSCOPY N/A 09/25/2015   Procedure: MEDIASTINOSCOPY;  Surgeon: Melrose Nakayama, MD;  Location: [facility];  Service: Thoracic;  Laterality: N/A;  . VIDEO BRONCHOSCOPY Bilateral 08/19/2015   Procedure: VIDEO BRONCHOSCOPY WITH FLUORO;  Surgeon: Rigoberto Noel, MD;  Location: [facility];  Service: Cardiopulmonary;  Laterality: Bilateral;  .  VIDEO BRONCHOSCOPY N/A 09/08/2015   Procedure: VIDEO BRONCHOSCOPY WITH FLUORO;  Surgeon: Rigoberto Noel, MD;  Location: [facility] OR;  Service: Thoracic;  Laterality: N/A;  . VIDEO BRONCHOSCOPY WITH ENDOBRONCHIAL ULTRASOUND Right 09/08/2015   Procedure: ATTEMPTED VIDEO BRONCHOSCOPY ENDOBRONCHIAL ULTRASOUND  ;  Surgeon: Rigoberto Noel, MD;  Location: [facility];  Service: Thoracic;  Laterality: Right;     OB History    Gravida  2   Para  1   Term      Preterm      AB  1    Living  1     SAB  1   IAB      Ectopic      Multiple      Live Births              Family History  Problem Relation Age of Onset  . Hypertension Mother        F and M  . CAD Father   . Diabetes Other        auncle-aunts   . Cancer Other        UTERINE   . Breast cancer Sister   . Stroke Neg Hx   . Colon cancer Neg Hx     Social History   Tobacco Use  . Smoking status: Never Smoker  . Smokeless tobacco: Never Used  Vaping Use  . Vaping Use: Never used  Substance Use Topics  . Alcohol use: Not Currently    Alcohol/week: 0.0 standard drinks  . Drug use: No    Home Medications Prior to Admission medications   Medication Sig Start Date End Date Taking? Authorizing Provider  cyclobenzaprine (FLEXERIL) 10 MG tablet Take 1 tablet (10 mg total) by mouth 2 (two) times daily as needed for muscle spasms. 09/09/20  Yes Gareth Morgan, MD  hydrOXYzine (ATARAX/VISTARIL) 25 MG tablet Take 2 tablets (50 mg total) by mouth every 8 (eight) hours as needed for anxiety, nausea or vomiting (before bed for sleep). 09/09/20  Yes Gareth Morgan, MD  ondansetron (ZOFRAN ODT) 4 MG disintegrating tablet Take 1 tablet (4 mg total) by mouth every 8 (eight) hours as needed for nausea or vomiting. 09/09/20  Yes Gareth Morgan, MD  pantoprazole (PROTONIX) 20 MG tablet Take 2 tablets (40 mg total) by mouth daily for 14 days. 09/09/20 09/23/20 Yes Gareth Morgan, MD  acetaminophen (TYLENOL) 500 MG tablet Take 1,000 mg by mouth every 6 (six) hours as needed for moderate pain or mild pain.    [provider]  Cholecalciferol (VITAMIN D3 PO) Take 1,200 mg by mouth daily.    [provider]  dronabinol (MARINOL) 2.5 MG capsule Take 1 capsule (2.5 mg total) by mouth 2 (two) times daily before a meal. 07/16/20   Curt Bears, MD  Ferrous Sulfate (IRON PO) Take 352 mg by mouth daily.    [provider]  Glycerin-Hypromellose-PEG 400 (DRY EYE RELIEF DROPS) 0.2-0.2-1 %  SOLN Place 1 drop into both eyes 2 (two) times daily.    [provider]  lidocaine-prilocaine (EMLA) cream Apply 1 application topically daily as needed (every four weeks).    [provider]  methylPREDNISolone (MEDROL DOSEPAK) 4 MG TBPK tablet Use as instructed. 08/20/20   Curt Bears, MD  Omega-3 1000 MG CAPS Take 1,000 mg by mouth daily as needed (sore throat).    [provider]  OVER THE COUNTER MEDICATION Apply 1 application topically daily as needed (pain). amish origins  [provider]  prochlorperazine (COMPAZINE) 10 MG tablet Take 1 tablet (10 mg total) by mouth every 6 (six) hours as needed for nausea or vomiting. 08/20/20   Curt Bears, MD  traMADol (ULTRAM) 50 MG tablet Take 1 tablet (50 mg total) by mouth every 6 (six) hours as needed for moderate pain or severe pain. 08/08/20   Melrose Nakayama, MD    Allergies    Patient has no known allergies.  Review of Systems   Review of Systems  Constitutional: Positive for fatigue. Negative for fever.  Eyes: Negative for visual disturbance.  Respiratory: Positive for cough and shortness of breath.   Cardiovascular: Negative for chest pain.  Gastrointestinal: Positive for abdominal pain, nausea and vomiting. Negative for constipation and diarrhea.  Genitourinary: Negative for difficulty urinating and dysuria.  Musculoskeletal: Negative for back pain and neck pain.  Skin: Negative for rash.  Neurological: Negative for syncope and headaches.    Physical Exam Updated Vital Signs BP 136/78   Pulse 94   Temp 97.8 F (36.6 C) (Oral)   Resp (!) 22   LMP 06/09/1998   SpO2 96%   Physical Exam Vitals and nursing note reviewed.  Constitutional:      General: She is not in acute distress.    Appearance: She is well-developed. She is not diaphoretic.  HENT:     Head: Normocephalic and atraumatic.  Eyes:     Conjunctiva/sclera: Conjunctivae normal.  Cardiovascular:     Rate and  Rhythm: Normal rate and regular rhythm.     Heart sounds: Normal heart sounds. No murmur heard. No friction rub. No gallop.   Pulmonary:     Effort: Pulmonary effort is normal. No respiratory distress.     Breath sounds: No wheezing or rales.     Comments: Diminished breath sounds bilateral bases Abdominal:     General: There is no distension.     Palpations: Abdomen is soft.     Tenderness: There is abdominal tenderness (epigastric, LUQ). There is no guarding.  Musculoskeletal:        General: No tenderness.     Cervical back: Normal range of motion.  Skin:    General: Skin is warm and dry.     Findings: No erythema or rash.  Neurological:     Mental Status: She is alert and oriented to person, place, and time.     ED Results / Procedures / Treatments   Labs (all labs ordered are listed, but only abnormal results are displayed) Labs Reviewed  CBC WITH DIFFERENTIAL/PLATELET - Abnormal; Notable for the following components:      Result Value   Hemoglobin 11.2 (*)    HCT 35.9 (*)    RDW 16.6 (*)    Lymphs Abs 0.6 (*)    All other components within normal limits  COMPREHENSIVE METABOLIC PANEL - Abnormal; Notable for the following components:   Sodium 131 (*)    Chloride 94 (*)    Glucose, Bld 107 (*)    Albumin 2.8 (*)    Alkaline Phosphatase 246 (*)    All other components within normal limits  URINALYSIS, ROUTINE W REFLEX MICROSCOPIC - Abnormal; Notable for the following components:   Color, Urine STRAW (*)    All other components within normal limits  RESP PANEL BY RT-PCR (FLU A&B, COVID) ARPGX2  LIPASE, BLOOD  BRAIN NATRIURETIC PEPTIDE  TROPONIN I (HIGH SENSITIVITY)  TROPONIN I (HIGH SENSITIVITY)    EKG EKG Interpretation  Date/Time:  Tuesday  September 09 2020 15:38:47 EDT Ventricular Rate:  106 PR Interval:  162 QRS Duration: 72 QT Interval:  316 QTC Calculation: 419 R Axis:   26 Text Interpretation: Sinus tachycardia Cannot rule out Anterior infarct , age  undetermined Abnormal ECG No significant change since last tracing Confirmed by Gareth Morgan [phone]) on 09/09/2020 5:02:53 PM   Radiology DG Chest 2 View  Result Date: 09/09/2020 CLINICAL DATA:  Status post PleurX catheter placement. History of lung cancer EXAM: CHEST - 2 VIEW COMPARISON:  08/27/2020. FINDINGS: There is a right chest wall port a catheter with tip terminating at the cavoatrial junction. Tunneled right sided pleural drainage catheter is stable in position. No pneumothorax identified. Loculated right pleural effusion is similar in volume to the previous exam. Mild interstitial edema suspected. Bilateral nodular opacities are unchanged from previous exam. IMPRESSION: 1. No significant interval change in volume of right pleural effusion compared with previous exam. 2. Similar appearance of bilateral pulmonary nodular opacities compatible with metastatic disease. Electronically Signed   By: Kerby Moors M.D.   On: 09/09/2020 14:12   CT Angio Chest PE W and/or Wo Contrast  Addendum Date: 09/09/2020   ADDENDUM REPORT: 09/09/2020 20:04 ADDENDUM: Last sentence of the 3rd impression should read Large left pleural effusion, increasing since prior study. Electronically Signed   By: Rolm Baptise M.D.   On: 09/09/2020 20:04   Result Date: 09/09/2020 CLINICAL DATA:  Near syncope.  Shortness of breath EXAM: CT ANGIOGRAPHY CHEST WITH CONTRAST TECHNIQUE: Multidetector CT imaging of the chest was performed using the standard protocol during bolus administration of intravenous contrast. Multiplanar CT image reconstructions and MIPs were obtained to evaluate the vascular anatomy. CONTRAST:  156m OMNIPAQUE IOHEXOL 350 MG/ML SOLN COMPARISON:  07/15/2020 FINDINGS: Cardiovascular: No filling defects in the pulmonary arteries to suggest pulmonary emboli. Heart is mildly enlarged. Aorta normal caliber. Scattered aortic calcifications. Mediastinum/Nodes: No mediastinal, hilar, or axillary adenopathy. Trachea and  esophagus are unremarkable. Thyroid unremarkable. Lungs/Pleura: Extensive bilateral pulmonary nodules and masses. Dominant mass in the right lower lobe compatible with a primary lung cancer and metastatic disease throughout the lungs. Small right pleural effusion with PleurX catheter in place. Large left pleural effusion, increasing since prior study. Upper Abdomen: Imaging into the upper abdomen demonstrates no acute findings. Musculoskeletal: Right chest wall Port-A-Cath in place. Extensive sclerotic osseous metastases throughout the bones. Review of the MIP images confirms the above findings. IMPRESSION: No evidence of pulmonary embolus. Again noted are the changes of lung cancer with mass in the right lower lobe with extensive bilateral pulmonary metastases and osseous metastases. Small right pleural effusion, decreasing since prior study with the placement of PleurX catheter. Large left pneumothorax, increasing since prior study. Electronically Signed: By: KRolm BaptiseM.D. On: 09/09/2020 19:01   CT ABDOMEN PELVIS W CONTRAST  Result Date: 09/09/2020 CLINICAL DATA:  Near syncope.  Pain.  Metastatic lung cancer. EXAM: CT ABDOMEN AND PELVIS WITH CONTRAST TECHNIQUE: Multidetector CT imaging of the abdomen and pelvis was performed using the standard protocol following bolus administration of intravenous contrast. CONTRAST:  1052mOMNIPAQUE IOHEXOL 350 MG/ML SOLN COMPARISON:  07/15/2020 FINDINGS: Lower chest: See chest CT report. Hepatobiliary: Layering gallstones within the gallbladder. No focal hepatic abnormality. Pancreas: No focal abnormality or ductal dilatation. Spleen: No focal abnormality.  Normal size. Adrenals/Urinary Tract: Ectopic left kidney in the upper pelvis. No renal mass or hydronephrosis. Adrenal glands and urinary bladder unremarkable. Stomach/Bowel: Stomach, large and small bowel grossly unremarkable. Vascular/Lymphatic: No evidence of aneurysm or  adenopathy. Reproductive: Uterus and adnexa  unremarkable.  No mass. Other: No free fluid or free air. Musculoskeletal: Extensive sclerotic metastases throughout the osseous structures. IMPRESSION: Cholelithiasis. Extensive osseous metastatic disease, stable. No acute findings in the abdomen or pelvis. Bilateral effusions. Electronically Signed   By: Rolm Baptise M.D.   On: 09/09/2020 19:03    Procedures Procedures   Medications Ordered in ED Medications  fentaNYL (SUBLIMAZE) injection 50 mcg (50 mcg Intravenous Given 09/09/20 1759)  ondansetron (ZOFRAN) injection 4 mg (4 mg Intravenous Given 09/09/20 1802)  iohexol (OMNIPAQUE) 350 MG/ML injection 100 mL (100 mLs Intravenous Contrast Given 09/09/20 1831)    ED Course  I have reviewed the triage vital signs and the nursing notes.  Pertinent labs & imaging results that were available during my care of the patient were reviewed by me and considered in my medical decision making (see chart for details).    MDM Rules/Calculators/A&P                          75yo female with history of stage IV adenocarcinoma of the right lung, bone metastases, hypertension, hyperlipidemia, pleural effusion, Port-A-Cath in place, anemia, presents with concern for shortness of breath and abdominal pain.  Differential diagnosis for dyspnea includes ACS, PE, COPD exacerbation, CHF exacerbation, pleural effusion, anemia, pneumonia, viral etiology such as COVID 19 infection, metabolic abnormality.  EKG was evaluated by me which showed no acute change from previous, sinus tachycardia.  BNP was 22.9, doubt CHF.   Denies CP, troponin 5, doubt ACS.   No significant anemia or electrolyte abnormality.  Regarding abdominal pain, DDx includes appendicitis, pancreatitis, cholecystitis, pyelonephritis, nephrolithiasis, diverticulitis, gastritis, pain related to pleural effusions, AAA.  No sign of pancreatitis on labs, no transaminitis, chronic alk phos elevation in setting of bone metastases.  CT abdomen shows no acute findings.  Cholelithiasis present but no signs of cholecystitis, not having specific RUQ pain and doubt symptomatic cholelithiasis. Possible gatritits/PUD, given rx for pantoprazole, zofran, flexeril for possible muscular pain and recommend outpt follow up.  CT PE study shows no evidence of PE but does show large left pleural effusion. Discussed with Dr. Roxan Hockey who does not recommend drain placement as she does not have prior hx of left sided pleural effusion.   Her oxygen levels remain primarily around 95-96%, RR in 20s, no respiratory distress and feel scheduling close outpatient thoracentesis is appropriate given current clinical status.  Did discuss possibility of admission given dyspnea with extion with IR procedure but she prefers outpatient and I feel this is appropriate given current vital signs and appearance. Contacted on call physician/IR regarding scheduling outpatient thoracentesis and recommend reaching out to her personal oncologist regarding this.  COVID test ordered and negative. Sent message to Dr. [facility]. Patient discharged in stable condition with understanding of reasons to return.    Final Clinical Impression(s) / ED Diagnoses Final diagnoses:  Pleural effusion on left  Epigastric abdominal pain    Rx / DC Orders ED Discharge Orders         Ordered    hydrOXYzine (ATARAX/VISTARIL) 25 MG tablet  Every 8 hours PRN        09/09/20 2316    ondansetron (ZOFRAN ODT) 4 MG disintegrating tablet  Every 8 hours PRN        09/09/20 2316    pantoprazole (PROTONIX) 20 MG tablet  Daily        09/09/20 2316    cyclobenzaprine (FLEXERIL)  10 MG tablet  2 times daily PRN        09/09/20 2316           Gareth Morgan, MD 09/10/20 0139

## 2020-09-09 NOTE — ED Triage Notes (Signed)
Pt bib ems near syncope, hx CA, c/o pain in neck, abdomen worsening over the last few days with increased shob. Pt seen by CT surgery today and was supposed to get CT PE study. Pt states come here for worsening abd pain and shob.

## 2020-09-09 NOTE — Progress Notes (Signed)
[address]       [place] [address]             [phone]     HPI: 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  She developed a malignant right pleural effusion.  I placed a pleural catheter on 08/08/2020.  Initially she had some relief with draining the catheter.  Over time there has been in decreasing the volume of drainage.  She started out draining every other day.  When it was less than 150 mL for 3 times she went to every other day.  She currently is on every third day.  She is drained about 351mL each of the last 2 times and drained 300 mL in the office today.  She is getting minimal symptomatic relief from that.  She complains of worsening shortness of breath.  She does feel like she has some swelling in her feet.  She also has some pain in her feet.  No calf pain.  No chest pain.  Past Medical History:  Diagnosis Date  . Adenocarcinoma of right lung, stage 4 ([place]) 2017   lump nodes  . Anemia   . Arthritis   . Bone metastases ([place]) 02/24/2017  . Encounter for antineoplastic chemotherapy 03/17/2016  . Hypercholesterolemia   . Hypertension 06/18/2016   just when I go to the hospital   . Osteopenia   . 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 Pneumonia   . Shortness of breath dyspnea   . 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 acetaminophen (TYLENOL) 500 MG tablet Take 1,000 mg by mouth every 6 (six) hours as needed for moderate pain or mild pain.    . Cholecalciferol (VITAMIN D3 PO) Take 1,200 mg by mouth daily.    Marland Kitchen dronabinol (MARINOL) 2.5 MG capsule Take 1 capsule (2.5 mg total) by mouth 2 (two) times daily before a meal. 60 capsule 0  . Ferrous Sulfate (IRON PO) Take 352 mg by mouth daily.    . Glycerin-Hypromellose-PEG 400 (DRY EYE RELIEF DROPS) 0.2-0.2-1 %  SOLN Place 1 drop into both eyes 2 (two) times daily.    Marland Kitchen lidocaine-prilocaine (EMLA) cream Apply 1 application topically daily as needed (every four weeks).    . methylPREDNISolone (MEDROL DOSEPAK) 4 MG TBPK tablet Use as instructed. 21 tablet 0  . Omega-3 1000 MG CAPS Take 1,000 mg by mouth daily as needed (sore throat).    Marland Kitchen OVER THE COUNTER MEDICATION Apply 1 application topically daily as needed (pain). amish origins    . prochlorperazine (COMPAZINE) 10 MG tablet Take 1 tablet (10 mg total) by mouth every 6 (six) hours as needed for nausea or vomiting. 30 tablet 0  . traMADol (ULTRAM) 50 MG tablet Take 1 tablet (50 mg total) by mouth every 6 (six) hours as needed for moderate pain or severe pain. 28 tablet 0   No current facility-administered medications for this visit.    Physical Exam BP (!) 170/100   Pulse 100   Resp 20   Ht 5\' 4"  (1.626 m)   Wt 126 lb (57.2 kg)   LMP 06/09/1998   SpO2 93% Comment: RA  BMI 21.63 kg/m  Ill-appearing 75 year old woman in no acute distress, but is dyspneic Using accessory  muscles of breathing Lungs diminished at bases bilaterally Cardiac regular rate and rhythm Had no edema or calf tenderness on exam  Diagnostic Tests: CHEST - 2 VIEW  COMPARISON:  08/27/2020.  FINDINGS: There is a right chest wall port a catheter with tip terminating at the cavoatrial junction. Tunneled right sided pleural drainage catheter is stable in position. No pneumothorax identified. Loculated right pleural effusion is similar in volume to the previous exam. Mild interstitial edema suspected. Bilateral nodular opacities are unchanged from previous exam.  IMPRESSION: 1. No significant interval change in volume of right pleural effusion compared with previous exam. 2. Similar appearance of bilateral pulmonary nodular opacities compatible with metastatic disease.   Electronically Signed   By: Kerby Moors M.D.   On: 09/09/2020 14:12 I personally  reviewed the chest x-ray images.  The chest x-ray was done before the pleural catheter was drained.  There was no change in the pleural effusion compared to her prior chest x-ray.  I do think there is more opacity at the left base.  This seemed to be more interstitial markings as well.  Impression: Valerie Long is a 75 year old woman with stage IV lung cancer with a malignant right pleural effusion.  I placed a pleural catheter for that on 08/08/2020.  She started out draining on a daily basis and then when it was less than 150 mL 3 times in a row she went every other day.  She now is on every third day and is draining about 300 mL at a time.  She now has worsening shortness of breath.  On exam she has diminished breath sounds at both bases.  There is no wheezing.  Her chest x-ray shows that the right pleural effusion is unchanged.  There is more opacity at the left base so she may be getting a worsening effusion on that side.  There also was an increase in interstitial markings which could be edema or lymphangitic tumor spread.  Always in the differential would be pulmonary embolus although she does not have any other symptoms that would point to that being the case.  Her oxygen saturations 93% on room air.  I recommended we do a CT of the chest to try and get a better handle on her dyspnea.  We will do that with PE protocol just to be on the safe side although I do not think that is the problem here.  I suspect she has developed more fluid on the left side.  In the meantime she can drain the pleural catheter more frequently if she feels short of breath.  However, the volumes of drainage are low enough that I do not think CAT scan to make a significant difference for her.  Hopefully it is just that there is a larger effusion on the left side could be tapped.  Progression of disease is certainly a possibility.  Plan: Continue to drain pleural catheter every 2 to 3days CT chest with PE protocol to  evaluate possible sources for shortness of breath. Follow-up with Dr. Julien Nordmann as scheduled Return in 1 week with PA and lateral chest x-ray  Melrose Nakayama, MD [facility] [phone]

## 2020-09-09 NOTE — ED Notes (Signed)
Sent a urine culture with the urine specimen 

## 2020-09-10 ENCOUNTER — Other Ambulatory Visit: Payer: Self-pay | Admitting: Internal Medicine

## 2020-09-10 DIAGNOSIS — C3491 Malignant neoplasm of unspecified part of right bronchus or lung: Secondary | ICD-10-CM

## 2020-09-10 LAB — RESP PANEL BY RT-PCR (FLU A&B, COVID) ARPGX2
Influenza A by PCR: NEGATIVE
Influenza B by PCR: NEGATIVE
SARS Coronavirus 2 by RT PCR: NEGATIVE

## 2020-09-11 ENCOUNTER — Telehealth: Payer: Self-pay | Admitting: Medical Oncology

## 2020-09-11 NOTE — Telephone Encounter (Signed)
CT addendum-LVM for pt - an addendum was posted on her CT angio that she has a large left pleural effusion, not air in her left lung.   I asked her to call back to central scheduling and r/s her thoracentesis tomorrow that she cancelled today .Seh called and has not heard back .  I called central scheduling to get it back on IR  schedule for tomorrow. I asked CS to have them call pt with outcome of appt.  I called Marcene Brawn and told her to expect a call from CS.

## 2020-09-11 NOTE — Telephone Encounter (Signed)
CT angio done yesterday-  Valerie Long said " I have fluid in left lung" She cancelled "the needle procedure" because she wants to try lasix. The CT angio shows-  "...Large left pneumothorax, increasing since prior study."  I told her she has air around or outside  the lung and lasix will not help .  I told her to call scheduling to r/s the "needle procedure".

## 2020-09-12 ENCOUNTER — Ambulatory Visit (HOSPITAL_COMMUNITY): Payer: Medicare Other

## 2020-09-12 DIAGNOSIS — D630 Anemia in neoplastic disease: Secondary | ICD-10-CM | POA: Diagnosis not present

## 2020-09-12 DIAGNOSIS — I10 Essential (primary) hypertension: Secondary | ICD-10-CM | POA: Diagnosis not present

## 2020-09-12 DIAGNOSIS — C3491 Malignant neoplasm of unspecified part of right bronchus or lung: Secondary | ICD-10-CM | POA: Diagnosis not present

## 2020-09-12 DIAGNOSIS — Z438 Encounter for attention to other artificial openings: Secondary | ICD-10-CM | POA: Diagnosis not present

## 2020-09-12 DIAGNOSIS — C7951 Secondary malignant neoplasm of bone: Secondary | ICD-10-CM | POA: Diagnosis not present

## 2020-09-12 DIAGNOSIS — J910 Malignant pleural effusion: Secondary | ICD-10-CM | POA: Diagnosis not present

## 2020-09-16 ENCOUNTER — Telehealth: Payer: Self-pay | Admitting: Medical Oncology

## 2020-09-16 ENCOUNTER — Ambulatory Visit: Payer: Medicare Other | Admitting: Thoracic Surgery (Cardiothoracic Vascular Surgery)

## 2020-09-16 ENCOUNTER — Encounter: Payer: Medicare Other | Admitting: Thoracic Surgery (Cardiothoracic Vascular Surgery)

## 2020-09-16 NOTE — Telephone Encounter (Signed)
Returned call to pt.  Thoracentesis was never scheduled per request.  I gave phone number to pt to call .

## 2020-09-17 ENCOUNTER — Ambulatory Visit: Payer: Medicare Other | Admitting: Internal Medicine

## 2020-09-17 ENCOUNTER — Ambulatory Visit: Payer: Medicare Other

## 2020-09-17 ENCOUNTER — Encounter: Payer: Medicare Other | Admitting: Dietician

## 2020-09-17 ENCOUNTER — Other Ambulatory Visit: Payer: Medicare Other

## 2020-09-18 DIAGNOSIS — D630 Anemia in neoplastic disease: Secondary | ICD-10-CM | POA: Diagnosis not present

## 2020-09-18 DIAGNOSIS — C7951 Secondary malignant neoplasm of bone: Secondary | ICD-10-CM | POA: Diagnosis not present

## 2020-09-18 DIAGNOSIS — I10 Essential (primary) hypertension: Secondary | ICD-10-CM | POA: Diagnosis not present

## 2020-09-18 DIAGNOSIS — J910 Malignant pleural effusion: Secondary | ICD-10-CM | POA: Diagnosis not present

## 2020-09-18 DIAGNOSIS — Z438 Encounter for attention to other artificial openings: Secondary | ICD-10-CM | POA: Diagnosis not present

## 2020-09-18 DIAGNOSIS — C3491 Malignant neoplasm of unspecified part of right bronchus or lung: Secondary | ICD-10-CM | POA: Diagnosis not present

## 2020-09-19 ENCOUNTER — Other Ambulatory Visit (HOSPITAL_COMMUNITY)
Admission: RE | Admit: 2020-09-19 | Discharge: 2020-09-19 | Disposition: A | Discharge status: Home / Self Care | Payer: Medicare Other | Source: Ambulatory Visit | Attending: Internal Medicine | Admitting: Internal Medicine

## 2020-09-19 DIAGNOSIS — Z01812 Encounter for preprocedural laboratory examination: Secondary | ICD-10-CM | POA: Diagnosis not present

## 2020-09-19 DIAGNOSIS — Z20822 Contact with and (suspected) exposure to covid-19: Secondary | ICD-10-CM | POA: Diagnosis not present

## 2020-09-19 LAB — SARS CORONAVIRUS 2 (TAT 6-24 HRS): SARS Coronavirus 2: NEGATIVE

## 2020-09-22 ENCOUNTER — Ambulatory Visit (HOSPITAL_COMMUNITY)
Admission: RE | Admit: 2020-09-22 | Discharge: 2020-09-22 | Disposition: A | Discharge status: Home / Self Care | Payer: Medicare Other | Source: Ambulatory Visit | Attending: Student | Admitting: Student

## 2020-09-22 ENCOUNTER — Other Ambulatory Visit: Payer: Self-pay

## 2020-09-22 ENCOUNTER — Ambulatory Visit (HOSPITAL_COMMUNITY): Admission: RE | Admit: 2020-09-22 | Payer: Medicare Other | Source: Ambulatory Visit

## 2020-09-22 ENCOUNTER — Ambulatory Visit (HOSPITAL_COMMUNITY)
Admission: RE | Admit: 2020-09-22 | Discharge: 2020-09-22 | Disposition: A | Discharge status: Home / Self Care | Payer: Medicare Other | Source: Ambulatory Visit | Attending: Internal Medicine | Admitting: Internal Medicine

## 2020-09-22 DIAGNOSIS — R918 Other nonspecific abnormal finding of lung field: Secondary | ICD-10-CM | POA: Diagnosis not present

## 2020-09-22 DIAGNOSIS — C7802 Secondary malignant neoplasm of left lung: Secondary | ICD-10-CM | POA: Insufficient documentation

## 2020-09-22 DIAGNOSIS — J90 Pleural effusion, not elsewhere classified: Secondary | ICD-10-CM | POA: Insufficient documentation

## 2020-09-22 DIAGNOSIS — C3491 Malignant neoplasm of unspecified part of right bronchus or lung: Secondary | ICD-10-CM

## 2020-09-22 DIAGNOSIS — C7801 Secondary malignant neoplasm of right lung: Secondary | ICD-10-CM | POA: Insufficient documentation

## 2020-09-22 MED ORDER — LIDOCAINE HCL 1 % IJ SOLN
INTRAMUSCULAR | Status: AC
  Filled 2020-09-22: qty 10

## 2020-09-22 NOTE — Procedures (Signed)
PROCEDURE SUMMARY:  Successful image-guided left thoracentesis. Yielded 800 cc of clear dark brown fluid. Pt tolerated procedure well. No immediate complications. EBL = trace   Specimen was not sent for labs. CXR ordered.  Please see imaging section of Epic for full dictation.  Armando Gang Errica Dutil PA-C 09/22/2020 2:45 PM

## 2020-09-23 ENCOUNTER — Encounter: Payer: Self-pay | Admitting: Internal Medicine

## 2020-09-23 ENCOUNTER — Inpatient Hospital Stay: Payer: Medicare Other

## 2020-09-23 ENCOUNTER — Inpatient Hospital Stay (HOSPITAL_BASED_OUTPATIENT_CLINIC_OR_DEPARTMENT_OTHER): Payer: Medicare Other | Admitting: Internal Medicine

## 2020-09-23 ENCOUNTER — Inpatient Hospital Stay: Payer: Medicare Other | Attending: Internal Medicine

## 2020-09-23 ENCOUNTER — Inpatient Hospital Stay: Payer: Medicare Other | Admitting: Dietician

## 2020-09-23 VITALS — HR 103

## 2020-09-23 VITALS — BP 146/79 | HR 106 | Temp 97.3°F | Resp 18 | Ht 64.0 in | Wt 126.7 lb

## 2020-09-23 DIAGNOSIS — Z79899 Other long term (current) drug therapy: Secondary | ICD-10-CM | POA: Insufficient documentation

## 2020-09-23 DIAGNOSIS — C3491 Malignant neoplasm of unspecified part of right bronchus or lung: Secondary | ICD-10-CM | POA: Diagnosis not present

## 2020-09-23 DIAGNOSIS — Z9221 Personal history of antineoplastic chemotherapy: Secondary | ICD-10-CM | POA: Diagnosis not present

## 2020-09-23 DIAGNOSIS — C3431 Malignant neoplasm of lower lobe, right bronchus or lung: Secondary | ICD-10-CM | POA: Insufficient documentation

## 2020-09-23 DIAGNOSIS — Z923 Personal history of irradiation: Secondary | ICD-10-CM | POA: Insufficient documentation

## 2020-09-23 DIAGNOSIS — Z95828 Presence of other vascular implants and grafts: Secondary | ICD-10-CM

## 2020-09-23 DIAGNOSIS — Z5112 Encounter for antineoplastic immunotherapy: Secondary | ICD-10-CM | POA: Insufficient documentation

## 2020-09-23 DIAGNOSIS — T451X5A Adverse effect of antineoplastic and immunosuppressive drugs, initial encounter: Secondary | ICD-10-CM

## 2020-09-23 DIAGNOSIS — C7951 Secondary malignant neoplasm of bone: Secondary | ICD-10-CM | POA: Diagnosis not present

## 2020-09-23 DIAGNOSIS — D6481 Anemia due to antineoplastic chemotherapy: Secondary | ICD-10-CM | POA: Diagnosis not present

## 2020-09-23 DIAGNOSIS — I10 Essential (primary) hypertension: Secondary | ICD-10-CM | POA: Diagnosis not present

## 2020-09-23 DIAGNOSIS — R5382 Chronic fatigue, unspecified: Secondary | ICD-10-CM

## 2020-09-23 LAB — CBC WITH DIFFERENTIAL (CANCER CENTER ONLY)
Abs Immature Granulocytes: 0.04 10*3/uL (ref 0.00–0.07)
Basophils Absolute: 0 10*3/uL (ref 0.0–0.1)
Basophils Relative: 0 %
Eosinophils Absolute: 0.1 10*3/uL (ref 0.0–0.5)
Eosinophils Relative: 1 %
HCT: 32 % — ABNORMAL LOW (ref 36.0–46.0)
Hemoglobin: 10.5 g/dL — ABNORMAL LOW (ref 12.0–15.0)
Immature Granulocytes: 1 %
Lymphocytes Relative: 8 %
Lymphs Abs: 0.7 10*3/uL (ref 0.7–4.0)
MCH: 27.8 pg (ref 26.0–34.0)
MCHC: 32.8 g/dL (ref 30.0–36.0)
MCV: 84.7 fL (ref 80.0–100.0)
Monocytes Absolute: 0.7 10*3/uL (ref 0.1–1.0)
Monocytes Relative: 9 %
Neutro Abs: 6.8 10*3/uL (ref 1.7–7.7)
Neutrophils Relative %: 81 %
Platelet Count: 253 10*3/uL (ref 150–400)
RBC: 3.78 MIL/uL — ABNORMAL LOW (ref 3.87–5.11)
RDW: 16.5 % — ABNORMAL HIGH (ref 11.5–15.5)
WBC Count: 8.3 10*3/uL (ref 4.0–10.5)
nRBC: 0 % (ref 0.0–0.2)

## 2020-09-23 LAB — CMP (CANCER CENTER ONLY)
ALT: 8 U/L (ref 0–44)
AST: 23 U/L (ref 15–41)
Albumin: 2.6 g/dL — ABNORMAL LOW (ref 3.5–5.0)
Alkaline Phosphatase: 249 U/L — ABNORMAL HIGH (ref 38–126)
Anion gap: 8 (ref 5–15)
BUN: 13 mg/dL (ref 8–23)
CO2: 30 mmol/L (ref 22–32)
Calcium: 9.1 mg/dL (ref 8.9–10.3)
Chloride: 94 mmol/L — ABNORMAL LOW (ref 98–111)
Creatinine: 0.79 mg/dL (ref 0.44–1.00)
GFR, Estimated: >60 mL/min (ref >60)
Glucose, Bld: 135 mg/dL — ABNORMAL HIGH (ref 70–99)
Potassium: 4 mmol/L (ref 3.5–5.1)
Sodium: 132 mmol/L — ABNORMAL LOW (ref 135–145)
Total Bilirubin: 0.6 mg/dL (ref 0.3–1.2)
Total Protein: 6.6 g/dL (ref 6.5–8.1)

## 2020-09-23 LAB — TSH: TSH: 1.446 u[IU]/mL (ref 0.308–3.960)

## 2020-09-23 MED ORDER — SODIUM CHLORIDE 0.9 % IV SOLN
Freq: Once | INTRAVENOUS | Status: AC
  Filled 2020-09-23: qty 250

## 2020-09-23 MED ORDER — SODIUM CHLORIDE 0.9% FLUSH
10.0000 mL | INTRAVENOUS | Status: DC | PRN
Start: 2020-09-23 — End: 2020-09-23
  Administered 2020-09-23: 10 mL via INTRAVENOUS
  Filled 2020-09-23: qty 10

## 2020-09-23 MED ORDER — HEPARIN SOD (PORK) LOCK FLUSH 100 UNIT/ML IV SOLN
500.0000 [IU] | Freq: Once | INTRAVENOUS | Status: AC | PRN
  Administered 2020-09-23: 500 [IU]
  Filled 2020-09-23: qty 5

## 2020-09-23 MED ORDER — ZOLEDRONIC ACID 4 MG/100ML IV SOLN
4.0000 mg | Freq: Once | INTRAVENOUS | Status: AC
  Administered 2020-09-23: 4 mg via INTRAVENOUS

## 2020-09-23 MED ORDER — SODIUM CHLORIDE 0.9 % IV SOLN
480.0000 mg | Freq: Once | INTRAVENOUS | Status: AC
  Administered 2020-09-23: 480 mg via INTRAVENOUS
  Filled 2020-09-23: qty 48

## 2020-09-23 MED ORDER — ZOLEDRONIC ACID 4 MG/100ML IV SOLN
INTRAVENOUS | Status: AC
  Filled 2020-09-23: qty 100

## 2020-09-23 MED ORDER — PANTOPRAZOLE SODIUM 20 MG PO TBEC: 40.0000 mg | DELAYED_RELEASE_TABLET | Freq: Every day | ORAL | 0 refills | Status: DC

## 2020-09-23 MED ORDER — SODIUM CHLORIDE 0.9% FLUSH
10.0000 mL | Freq: Once | INTRAVENOUS | Status: AC
  Administered 2020-09-23: 10 mL via INTRAVENOUS
  Filled 2020-09-23: qty 10

## 2020-09-23 NOTE — Patient Instructions (Signed)
Zoledronic Acid Injection (Hypercalcemia, Oncology) Qu es este medicamento? El CIDO ZOLEDRNICO desacelera la prdida de calcio de los [place] para tratar los niveles elevados de calcio en la sangre causados por algunos tipos de cncer. Se puede usar en otras personas con riesgo de prdida de masa sea. Este medicamento puede ser utilizado para otros usos; si tiene alguna pregunta consulte con su proveedor de atencin mdica o con su farmacutico. MARCAS COMUNES: Zometa Qu le debo informar a mi profesional de la salud antes de tomar este medicamento? Necesitan saber si usted presenta alguno de los siguientes problemas o situaciones: cncer deshidratacin enfermedad dental enfermedad renal enfermedad heptica niveles bajos de calcio en la sangre enfermedad pulmonar o respiratoria (asma) recibe medicamentos esteroideos tales como dexametasona o prednisona una reaccin alrgica o inusual al cido zoledrnico, a otros medicamentos, alimentos, colorantes o conservantes si est embarazada o buscando quedar embarazada si est amamantando a un beb Cmo debo utilizar este medicamento? Este frmaco se inyecta en una vena. Lo administra un proveedor de Air traffic controller hospital o en un entorno clnico. Hable con su proveedor de atencin mdica sobre el uso de este frmaco en nios. Puede requerir atencin especial. Sobredosis: Pngase en contacto inmediatamente con un centro toxicolgico o una sala de urgencia si usted cree que haya tomado demasiado medicamento. ATENCIN: [employer] es solo para usted. No comparta este medicamento con nadie. Qu sucede si me olvido de una dosis? Cumpla con las citas para dosis de seguimiento. Es importante no olvidar ninguna dosis. Llame a su proveedor de atencin mdica si no puede asistir a una cita. Qu puede interactuar con este medicamento? ciertos antibiticos inyectables AINE, medicamentos para Conservation officer, historic buildings y la inflamacin, tales como ibuprofeno o  naproxeno algunos diurticos tales como bumetanida, furosemida teriparatida talidomida Puede ser que esta lista no menciona todas las posibles interacciones. Informe a su profesional de [employer]	[place] de [employer] productos a base de hierbas, medicamentos de [place] o suplementos nutritivos que est tomando. Si usted fuma, consume bebidas alcohlicas o si utiliza drogas ilegales, indqueselo tambin a su profesional de KB Home	[place]. Algunas sustancias pueden interactuar con su medicamento. A qu debo estar atento al usar Coca[employer]Cola? Visite a su proveedor de atencin mdica para que revise regularmente su evolucin. Es posible que pase un tiempo antes de que pueda notar los beneficios de este frmaco. Clinical research associate personas que toman este frmaco tienen dolor intenso en los Ai, las articulaciones o los msculos. Este frmaco tambin puede aumentar su riesgo de problemas en la [place] o de tener una fractura de fmur. Informe a su proveedor de Geophysical data processor de inmediato si tiene Education administrator en la mandbula, los huesos, las articulaciones o los msculos. Informe a su proveedor de atencin mdica si tiene dolor que no desaparece o que empeora. Informe a su dentista y a su Environmental education officer que est usando este frmaco. No deben realizarle una ciruga dental mayor mientras [place] este frmaco. Antes de comenzar a usar este frmaco, visite a su dentista para que Retail buyer un examen y arregle cualquier problema dental. Cuide bien sus dientes mientras [place] este frmaco. Asegrese de visitar a su dentista para citas de seguimiento peridicas. Debe asegurarse de recibir suficiente calcio y vitamina D mientras toma este frmaco. Converse con su proveedor de atencin mdica sobre los alimentos que come y las vitaminas que toma. Consulte con su proveedor de atencin mdica si tiene diarrea grave, nuseas y vmitos, o sudoracin intensa. La prdida de demasiado  lquido corporal puede hacer que sea peligroso usar este  frmaco. Usted podra necesitar realizarse anlisis de sangre mientras est usando este frmaco. No debe quedar embarazada mientras est usando este medicamento. Las mujeres deben informar a su proveedor de atencin mdica si estn buscando quedar embarazadas o si creen que podran estar embarazadas. Existe la posibilidad de daos graves en un beb sin nacer. Para obtener ms informacin, hable con su proveedor de atencin mdica. Qu efectos secundarios puedo tener al [employer] este medicamento? Efectos secundarios que debe informar a su mdico o a su proveedor de atencin mdica tan pronto como sea posible: Chief of Staff (erupcin cutnea, comezn/picazn o urticaria; hinchazn de la cara, los labios o la lengua) dolor de huesos infeccin (fiebre, escalofros, tos, Social research officer, government de Investment banker, operational, Social research officer, government o dificultad para Garment/textile technologist) dolor de Development worker, community, especialmente despus de tratamiento odontolgico dolor en las articulaciones lesin renal (dificultad para Garment/textile technologist o cambios en la cantidad de [place]) presin sangunea baja (mareos; sensacin de Secondary school teacher o aturdimiento; cadas; debilidad o cansancio inusuales) niveles bajos de calcio (frecuencia cardiaca rpida; calambres musculares o dolor muscular; dolor, hormigueo o entumecimiento en las manos o pies; convulsiones) niveles bajos de magnesio (frecuencia cardiaca rpida, irregular; dolor o calambres musculares; debilidad muscular; temblores; convulsiones) recuentos bajos de glbulos rojos (dificultad para respirar; sensacin de desmayo; aturdimiento; cadas; debilidad o cansancio inusuales) dolor muscular enrojecimiento, formacin de ampollas, descamacin o distensin de la piel, incluso dentro de la boca diarrea grave hinchazn de tobillos, pies, manos problemas para respirar Efectos secundarios que generalmente no requieren atencin mdica (debe informarlos a su mdico o a su proveedor de atencin mdica si persisten o si son molestos): ansiedad estreimiento tos  estado de nimo deprimido irritacin, comezn/picazn o dolor ocular fiebre sensacin general de estar enfermo o sntomas gripales nuseas dolor, enrojecimiento o irritacin en el lugar de la inyeccin dificultad para dormir Puede ser que esta lista no menciona todos los posibles efectos secundarios. Comunquese a su mdico por asesoramiento mdico [employer]. Usted puede informar los efectos secundarios a la FDA por telfono al 1-800-FDA-1088. Dnde debo guardar mi medicina? Este medicamento se administra en hospitales o clnicas. No se guardar en su casa. ATENCIN: Este folleto es un resumen. Puede ser que no cubra toda la posible informacin. Si usted tiene preguntas acerca de esta medicina, consulte con su mdico, su farmacutico o su profesional de Technical sales engineer.  2021 Elsevier/Gold Standard (2019-09-27 00:00:00)  Nivolumab injection Qu es este medicamento? El NIVOLUMAB es un anticuerpo monoclonal. Se utiliza en el tratamiento del cncer de colon, cncer de esfago, cncer de cabeza y cuello, linfoma de Hodgkin, cncer de rin, cncer de hgado, cncer de pulmn, mesotelioma, melanoma y cncer urotelial. Este medicamento puede ser utilizado para otros usos; si tiene alguna pregunta consulte con su proveedor de atencin mdica o con su farmacutico. MARCAS COMUNES: Opdivo Qu le debo informar a mi profesional de la salud antes de tomar este medicamento? Necesitan saber si usted presenta alguno de los siguientes problemas o situaciones: enfermedades autoinmunes tales como enfermedad de Crohn, colitis ulcerativa o lupus ha tenido o planea tener un trasplante alognico de Social research officer, government ([place] las clulas madre de Theatre manager persona) antecedentes de radiacin en el pecho antecedentes de trasplante de rganos problemas del sistema nervioso, tales como miastenia grave o sndrome de Curator una reaccin alrgica o inusual al nivolumab, a otros medicamentos, alimentos, colorantes o  conservantes si est embarazada o buscando quedar embarazada si est amamantando a un beb Cmo debo utilizar este medicamento?  Este medicamento se administra mediante infusin en una vena. Lo administra un profesional de Technical sales engineer en un hospital o en un entorno clnico. Se le entregar una [place] del medicamento (MedGuide, su nombre en ingls) especial antes de cada tratamiento. Asegrese de leer esta informacin cada vez cuidadosamente. Hable con su pediatra para informarse acerca del uso de este medicamento en nios. Aunque este medicamento se puede recetar a nios tan pequeos como de 12 aos de edad con ciertas afecciones, existen precauciones que deben tomarse. Sobredosis: Pngase en contacto inmediatamente con un centro toxicolgico o una sala de urgencia si usted cree que haya tomado demasiado medicamento. ATENCIN: [employer] es solo para usted. No comparta este medicamento con nadie. Qu sucede si me olvido de una dosis? Es importante no olvidar ninguna dosis. Informe a su mdico o a su profesional de la salud si no puede asistir a Photographer. Qu puede interactuar con este medicamento? No se han estudiado las interacciones. Puede ser que esta lista no menciona todas las posibles interacciones. Informe a su profesional de KB Home	[place] de [employer] productos a base de hierbas, medicamentos de [place] o suplementos nutritivos que est tomando. Si usted fuma, consume bebidas alcohlicas o si utiliza drogas ilegales, indqueselo tambin a su profesional de KB Home	[place]. Algunas sustancias pueden interactuar con su medicamento. A qu debo estar atento al usar Coca-Cola? Este medicamento podra hacerle sentir un Tree surgeon general. Contine con el tratamiento aun si se siente enfermo, a menos que su mdico le indique que lo suspenda. Usted podra necesitar realizarse [employer] de sangre mientras est usando [place]. No debe quedar embarazada mientras est usando este medicamento o  por 5 meses despus de dejar de usarlo. Las mujeres deben informar a su mdico si estn buscando quedar embarazadas o si creen que podran estar embarazadas. Existe la posibilidad de efectos secundarios graves en un beb sin nacer. Para obtener ms informacin, hable con su profesional de la salud o su farmacutico. No debe Economist a un beb mientras est usando este medicamento o por 5 meses despus de dejar de usarlo. Qu efectos secundarios puedo tener al [employer] este medicamento? Efectos secundarios que debe informar a su mdico o a Barrister's clerk de la salud tan pronto como sea posible: Chief of Staff, tales como erupcin cutnea, comezn/picazn o urticaria, e hinchazn de la cara, los labios o la lengua problemas para respirar sangre en la orina diarrea con sangre o acuosa, o heces de color negro y aspecto alquitranado cambios en las emociones o el estado de nimo cambios en la visin dolor en el pecho tos mareos sensacin de [place] o aturdimiento, cadas fiebre, escalofros dolor de cabeza con fiebre, rigidez del cuello, confusin, prdida de memoria, sensibilidad a la luz, alucinaciones, prdida de contacto con la realidad, o convulsiones dolor en las articulaciones llagas en la boca enrojecimiento, formacin de ampollas, descamacin o distensin de la piel, incluso dentro de la boca dolor muscular o debilidad muscular grave signos y sntomas de niveles elevados de azcar en la sangre, tales como mareo; boca seca; piel seca; aliento frutal; nuseas; dolor de estmago; aumento del apetito o la sed; aumento de la frecuencia urinaria signos y sntomas de lesin renal, tales como dificultad para Garment/textile technologist o cambios en la cantidad de orina signos y sntomas de lesin en el hgado, tales como orina amarilla oscura o Forensic psychologist; sensacin general de estar enfermo o sntomas gripales; heces claras; prdida del apetito; nuseas; dolor en la regin abdominal superior derecha; debilidad o  cansancio inusuales;  color amarillento de los ojos o la piel hinchazn de tobillos, pies, manos dificultad para orinar o cambios en la cantidad de [place] debilidad o cansancio inusuales aumento o prdida de peso Efectos secundarios que generalmente no requieren atencin mdica (infrmelos a su mdico o a Barrister's clerk de la salud si persisten o si son molestos): dolor de [employer] estreimiento disminucin del apetito diarrea dolor muscular nuseas, vmito cansancio Puede ser que esta lista no menciona todos los posibles efectos secundarios. Comunquese a su mdico por asesoramiento mdico [employer]. Usted puede informar los efectos secundarios a la FDA por telfono al 1-800-FDA-1088. Dnde debo guardar mi medicina? Este medicamento se administra en hospitales o clnicas, y no necesitar guardarlo en su domicilio. ATENCIN: Este folleto es un resumen. Puede ser que no cubra toda la posible informacin. Si usted tiene preguntas acerca de esta medicina, consulte con su mdico, su farmacutico o su profesional de Technical sales engineer.  2021 Elsevier/Gold Standard (2019-09-27 00:00:00)

## 2020-09-23 NOTE — Progress Notes (Signed)
[place] Telephone:(336) [phone]   Fax:(336) [place], MD [place] [address] [place] [address]  DIAGNOSIS: Stage IV (T1b, N2, M1b) non-small cell lung cancer, adenocarcinoma diagnosed in March 2017 and presented with right lower lobe lung nodule in addition to mediastinal lymphadenopathy and metastatic bone lesions.  Molecular studies: PDL 1 TPS  <1%.   Foundation One Studies: Positive for ERBB2 [id number]. Negative for EGFR, KRAS, ALK, BRAF, MET, RET and ROS1.  PRIOR THERAPY:  1) Induction systemic chemotherapy with carboplatin for AUC of 5 and Alimta 500 MG/M2 every 3 weeks is status post 6 cycles at the [facility], last dose was given 03/05/2016 with stable disease. 2) palliative radiation to the metastatic bone disease in the lower back and pelvic area. 3) Maintenance systemic chemotherapy with Alimta 500 MG/M2 every 3 weeks status post 73 cycles. First dose was given 03/25/2016.  Last dose was given June 16, 2020.  CURRENT THERAPY::  1) second line treatment with immunotherapy with nivolumab 480 IV every 4 weeks.  First dose July 23, 2020.  Status post 2 cycles. 2) Zometa 4 mg IV every 12 week for metastatic bone disease.  INTERVAL HIST Valerie Long 75 y.o. female returns to the clinic today for follow-up visit accompanied by her brother.  The patient continues to complain of shortness of breath at baseline.  Her oxygen saturation in the clinic was 98%.  She denied having any current chest pain, cough or hemoptysis.  She had 1 episode of vomiting dark vomitus.  She has no current nausea, vomiting, diarrhea or constipation.  She has no headache or visual changes.  She has no recent weight loss or night sweats.  She has ultrasound-guided left thoracentesis performed yesterday with drainage of 800 cc of pleural fluid.  She also continues to have drainage of the right pleural effusion via the  Pleurx catheter.  The patient had repeat CT scan of the chest, abdomen pelvis performed recently and she is here for evaluation and discussion of her scan results before starting cycle #3.   MEDICAL HISTORY: Past Medical History:  Diagnosis Date  . Adenocarcinoma of right lung, stage 4 ([place]) 2017   lump nodes  . Anemia   . Arthritis   . Bone metastases ([place]) 02/24/2017  . Encounter for antineoplastic chemotherapy 03/17/2016  . Hypercholesterolemia   . Hypertension 06/18/2016   just when I go to the hospital   . Osteopenia   . 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 Pneumonia   . Shortness of breath dyspnea   . 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  MEDICATIONS:  Current Outpatient Medications  Medication Sig Dispense Refill  . acetaminophen (TYLENOL) 500 MG tablet Take 1,000 mg by mouth every 6 (six) hours as needed for moderate pain or mild pain.    . Cholecalciferol (VITAMIN D3 PO) Take 1,200 mg by mouth daily.    . cyclobenzaprine (FLEXERIL) 10 MG tablet Take 1 tablet (10 mg total) by mouth 2 (two) times daily as needed for muscle spasms. 20 tablet 0  . dronabinol (MARINOL) 2.5 MG capsule Take 1 capsule (2.5 mg total) by mouth 2 (two) times daily before a meal. 60 capsule 0  . Ferrous Sulfate (IRON PO) Take 352 mg by mouth daily.    . Glycerin-Hypromellose-PEG 400 (DRY EYE RELIEF  DROPS) 0.2-0.2-1 % SOLN Place 1 drop into both eyes 2 (two) times daily.    . hydrOXYzine (ATARAX/VISTARIL) 25 MG tablet Take 2 tablets (50 mg total) by mouth every 8 (eight) hours as needed for anxiety, nausea or vomiting (before bed for sleep). 12 tablet 0  . lidocaine-prilocaine (EMLA) cream Apply 1 application topically daily as needed (every four weeks).    . methylPREDNISolone (MEDROL DOSEPAK) 4 MG TBPK tablet Use as instructed. 21 tablet 0  . Omega-3 1000 MG CAPS Take 1,000 mg by mouth daily as needed (sore throat).    . ondansetron (ZOFRAN ODT) 4  MG disintegrating tablet Take 1 tablet (4 mg total) by mouth every 8 (eight) hours as needed for nausea or vomiting. 20 tablet 0  . OVER THE COUNTER MEDICATION Apply 1 application topically daily as needed (pain). amish origins    . pantoprazole (PROTONIX) 20 MG tablet Take 2 tablets (40 mg total) by mouth daily for 14 days. 28 tablet 0  . prochlorperazine (COMPAZINE) 10 MG tablet Take 1 tablet (10 mg total) by mouth every 6 (six) hours as needed for nausea or vomiting. 30 tablet 0  . traMADol (ULTRAM) 50 MG tablet Take 1 tablet (50 mg total) by mouth every 6 (six) hours as needed for moderate pain or severe pain. 28 tablet 0   No current facility-administered medications for this visit.    SURGICAL HISTORY:  Past Surgical History:  Procedure Laterality Date  . CESAREAN SECTION     myomectomy  . CHEST TUBE INSERTION Right 08/08/2020   Procedure: INSERTION PLEURAL DRAINAGE CATHETER;  Surgeon: Melrose Nakayama, MD;  Location: [facility];  Service: Thoracic;  Laterality: Right;  . COLONOSCOPY W/ POLYPECTOMY    . IR GENERIC HISTORICAL  08/17/2016   IR US GUIDE VASC ACCESS RIGHT 08/17/2016 [name], MD [facility]  . IR GENERIC HISTORICAL  08/17/2016   IR FLUORO GUIDE PORT INSERTION RIGHT 08/17/2016 [name], MD [facility]  . MEDIASTINOSCOPY N/A 09/25/2015   Procedure: MEDIASTINOSCOPY;  Surgeon: Melrose Nakayama, MD;  Location: [facility];  Service: Thoracic;  Laterality: N/A;  . VIDEO BRONCHOSCOPY Bilateral 08/19/2015   Procedure: VIDEO BRONCHOSCOPY WITH FLUORO;  Surgeon: Rigoberto Noel, MD;  Location: [facility];  Service: Cardiopulmonary;  Laterality: Bilateral;  . VIDEO BRONCHOSCOPY N/A 09/08/2015   Procedure: VIDEO BRONCHOSCOPY WITH FLUORO;  Surgeon: Rigoberto Noel, MD;  Location: [facility];  Service: Thoracic;  Laterality: N/A;  . VIDEO BRONCHOSCOPY WITH ENDOBRONCHIAL ULTRASOUND Right 09/08/2015   Procedure: ATTEMPTED VIDEO BRONCHOSCOPY ENDOBRONCHIAL ULTRASOUND  ;  Surgeon: Rigoberto Noel, MD;  Location: [facility];  Service: Thoracic;  Laterality: Right;    REVIEW OF SYSTEMS:  Constitutional: positive for anorexia and fatigue Eyes: negative Ears, nose, mouth, throat, and face: negative Respiratory: positive for dyspnea on exertion Cardiovascular: positive for tachypnea Gastrointestinal: positive for nausea Genitourinary:negative Integument/breast: negative Hematologic/lymphatic: negative Musculoskeletal:positive for arthralgias, back pain and muscle weakness Neurological: negative Behavioral/Psych: negative Endocrine: negative Allergic/Immunologic: negative   PHYSICAL EXAMINATION: General appearance: alert, cooperative, fatigued and no distress Head: Normocephalic, without obvious abnormality, atraumatic Neck: no adenopathy, no JVD, supple, symmetrical, trachea midline and thyroid not enlarged, symmetric, no tenderness/mass/nodules Lymph nodes: Cervical, supraclavicular, and axillary nodes normal. Resp: clear to auscultation bilaterally Back: symmetric, no curvature. ROM normal. No CVA tenderness. Cardio: regular rate and rhythm, S1, S2 normal, no murmur, click, rub or gallop GI: soft, non-tender; bowel sounds normal; no masses,  no organomegaly Extremities: extremities normal, atraumatic, no  cyanosis or edema Neurologic: Alert and oriented X 3, normal strength and tone. Normal symmetric reflexes. Normal coordination and gait  ECOG PERFORMANCE STATUS: 1 - Symptomatic but completely ambulatory  Blood pressure (!) 146/79, pulse (!) 106, temperature (!) 97.3 F (36.3 C), temperature source Tympanic, resp. rate 18, height '5\' 4"'  (1.626 m), weight 126 lb 11.2 oz (57.5 kg), last menstrual period 06/09/1998, SpO2 98 %.  LABORATORY DATA: Lab Results  Component Value Date   WBC 8.3 09/23/2020   HGB 10.5 (L) 09/23/2020   HCT 32.0 (L) 09/23/2020   MCV 84.7 09/23/2020   PLT 253 09/23/2020      Chemistry      Component Value Date/Time   NA 132 (L) 09/23/2020 1018    NA 137 06/09/2017 0901   K 4.0 09/23/2020 1018   K 3.9 06/09/2017 0901   CL 94 (L) 09/23/2020 1018   CO2 30 09/23/2020 1018   CO2 25 06/09/2017 0901   BUN 13 09/23/2020 1018   BUN 19.0 06/09/2017 0901   CREATININE 0.79 09/23/2020 1018   CREATININE 1.2 (H) 06/09/2017 0901   GLU 170 01/23/2016 0000      Component Value Date/Time   CALCIUM 9.1 09/23/2020 1018   CALCIUM 9.9 06/09/2017 0901   ALKPHOS 249 (H) 09/23/2020 1018   ALKPHOS 63 06/09/2017 0901   AST 23 09/23/2020 1018   AST 35 (H) 06/09/2017 0901   ALT 8 09/23/2020 1018   ALT 23 06/09/2017 0901   BILITOT 0.6 09/23/2020 1018   BILITOT 0.65 06/09/2017 0901       RADIOGRAPHIC STUDIES:  DG Chest 1 View  Result Date: 09/22/2020 CLINICAL DATA:  Post thoracentesis on the left. EXAM: CHEST  1 VIEW COMPARISON:  Radiographs and CT 09/09/2020. FINDINGS: 1523 hours. The left pleural effusion has mildly decreased in volume. The right pleural effusion appears unchanged. Right pleural drainage catheter and right IJ Port-A-Cath remain in place. The heart size and mediastinal contours are stable. Bibasilar atelectasis and multiple pulmonary nodules are again noted. No pneumothorax. IMPRESSION: Decreased left pleural effusion and no pneumothorax following thoracentesis. Known bilateral pulmonary metastases are again seen. Electronically Signed   By: Richardean Sale M.D.   On: 09/22/2020 15:33   DG Chest 2 View  Result Date: 09/09/2020 CLINICAL DATA:  Status post PleurX catheter placement. History of lung cancer EXAM: CHEST - 2 VIEW COMPARISON:  08/27/2020. FINDINGS: There is a right chest wall port a catheter with tip terminating at the cavoatrial junction. Tunneled right sided pleural drainage catheter is stable in position. No pneumothorax identified. Loculated right pleural effusion is similar in volume to the previous exam. Mild interstitial edema suspected. Bilateral nodular opacities are unchanged from previous exam. IMPRESSION: 1. No  significant interval change in volume of right pleural effusion compared with previous exam. 2. Similar appearance of bilateral pulmonary nodular opacities compatible with metastatic disease. Electronically Signed   By: Kerby Moors M.D.   On: 09/09/2020 14:12   DG Chest 2 View  Result Date: 08/27/2020 EXAM: CHEST - 2 VIEW COMPARISON:  08/08/2020 FINDINGS: Right chest wall port a catheter is identified with tip in the projection of the SVC. Interval placement of right-sided PleurX catheter. Right pleural effusion is mildly decreased in volume from previous exam. Small left pleural effusion is unchanged. Bilateral pulmonary nodular opacities are again noted and appear stable. IMPRESSION: Interval placement of right-sided PleurX catheter with mild decrease in volume of right pleural effusion. Electronically Signed   By: Queen Slough.D.  On: 08/27/2020 11:02   CT Angio Chest PE W and/or Wo Contrast  Addendum Date: 09/09/2020   ADDENDUM REPORT: 09/09/2020 20:04 ADDENDUM: Last sentence of the 3rd impression should read Large left pleural effusion, increasing since prior study. Electronically Signed   By: Rolm Baptise M.D.   On: 09/09/2020 20:04   Result Date: 09/09/2020 CLINICAL DATA:  Near syncope.  Shortness of breath EXAM: CT ANGIOGRAPHY CHEST WITH CONTRAST TECHNIQUE: Multidetector CT imaging of the chest was performed using the standard protocol during bolus administration of intravenous contrast. Multiplanar CT image reconstructions and MIPs were obtained to evaluate the vascular anatomy. CONTRAST:  144m OMNIPAQUE IOHEXOL 350 MG/ML SOLN COMPARISON:  07/15/2020 FINDINGS: Cardiovascular: No filling defects in the pulmonary arteries to suggest pulmonary emboli. Heart is mildly enlarged. Aorta normal caliber. Scattered aortic calcifications. Mediastinum/Nodes: No mediastinal, hilar, or axillary adenopathy. Trachea and esophagus are unremarkable. Thyroid unremarkable. Lungs/Pleura: Extensive bilateral  pulmonary nodules and masses. Dominant mass in the right lower lobe compatible with a primary lung cancer and metastatic disease throughout the lungs. Small right pleural effusion with PleurX catheter in place. Large left pleural effusion, increasing since prior study. Upper Abdomen: Imaging into the upper abdomen demonstrates no acute findings. Musculoskeletal: Right chest wall Port-A-Cath in place. Extensive sclerotic osseous metastases throughout the bones. Review of the MIP images confirms the above findings. IMPRESSION: No evidence of pulmonary embolus. Again noted are the changes of lung cancer with mass in the right lower lobe with extensive bilateral pulmonary metastases and osseous metastases. Small right pleural effusion, decreasing since prior study with the placement of PleurX catheter. Large left pneumothorax, increasing since prior study. Electronically Signed: By: KRolm BaptiseM.D. On: 09/09/2020 19:01   CT ABDOMEN PELVIS W CONTRAST  Result Date: 09/09/2020 CLINICAL DATA:  Near syncope.  Pain.  Metastatic lung cancer. EXAM: CT ABDOMEN AND PELVIS WITH CONTRAST TECHNIQUE: Multidetector CT imaging of the abdomen and pelvis was performed using the standard protocol following bolus administration of intravenous contrast. CONTRAST:  1030mOMNIPAQUE IOHEXOL 350 MG/ML SOLN COMPARISON:  07/15/2020 FINDINGS: Lower chest: See chest CT report. Hepatobiliary: Layering gallstones within the gallbladder. No focal hepatic abnormality. Pancreas: No focal abnormality or ductal dilatation. Spleen: No focal abnormality.  Normal size. Adrenals/Urinary Tract: Ectopic left kidney in the upper pelvis. No renal mass or hydronephrosis. Adrenal glands and urinary bladder unremarkable. Stomach/Bowel: Stomach, large and small bowel grossly unremarkable. Vascular/Lymphatic: No evidence of aneurysm or adenopathy. Reproductive: Uterus and adnexa unremarkable.  No mass. Other: No Long fluid or Long air. Musculoskeletal: Extensive  sclerotic metastases throughout the osseous structures. IMPRESSION: Cholelithiasis. Extensive osseous metastatic disease, stable. No acute findings in the abdomen or pelvis. Bilateral effusions. Electronically Signed   By: KeRolm Baptise.D.   On: 09/09/2020 19:03   USKoreahoracentesis Asp Pleural space w/IMG guide  Result Date: 09/22/2020 INDICATION: Recurrent pleural effusion due to stage IV lung cancer. Request for therapeutic thoracentesis. EXAM: ULTRASOUND GUIDED LEFT THORACENTESIS MEDICATIONS: 10 mL 1% lidocaine COMPLICATIONS: None immediate.  No pneumothorax on follow-up chest radiograph. PROCEDURE: An ultrasound guided thoracentesis was thoroughly discussed with the patient and questions answered. The benefits, risks, alternatives and complications were also discussed. The patient understands and wishes to proceed with the procedure. Written consent was obtained. Ultrasound was performed to localize and mark an adequate pocket of fluid in the left chest. The area was then prepped and draped in the normal sterile fashion. 1% Lidocaine was used for local anesthesia. Under ultrasound guidance a 6 Fr Safe-T-Centesis  catheter was introduced. Thoracentesis was performed. The catheter was removed and a dressing applied. FINDINGS: A total of approximately 800 cc of clear, dark brown fluid was removed. Due to patient chest discomfort only the above amount of fluid was removed today. Post procedure chest X-ray reviewed, negative for pneumothorax. IMPRESSION: Successful ultrasound guided left thoracentesis yielding 800 cc of pleural fluid. Read by: Durenda Guthrie, PA-C Electronically Signed   By: Lucrezia Europe M.D.   On: 09/22/2020 15:39    ASSESSMENT AND PLAN:  This is a very pleasant 75 years old Hispanic female with metastatic non-small cell lung cancer, adenocarcinoma diagnosed in March 2017 status post induction systemic chemotherapy with carboplatin and Alimta and she is currently on maintenance treatment with single  agent Alimta status post 73 cycles. The patient has been tolerating this treatment well but recently she started having more increasing fatigue and weakness as well as shortness of breath.  She had repeat CT scan of the chest, abdomen pelvis performed recently.  I personally and independently reviewed the scan images and discussed the results with the patient and her brother. Unfortunately her scan showed evidence for disease progression with increase in pleural effusion especially on the right side as well as enlargement of many of the lung nodules and masses. She is currently undergoing second line treatment with immunotherapy with nivolumab 480 mg IV every 4 weeks status post 2 cycles.   The patient is tolerating this treatment well except for fatigue and occasional nausea. She had repeat CT scan of the chest, abdomen pelvis performed less than 2 weeks ago and that showed no concerning findings for disease progression. I recommended for her to continue her current treatment with nivolumab and she will proceed with cycle #3 today. For the bone disease, she will continue her treatment with Zometa every 12 weeks. For the chronic back pain she will continue her current treatment with gabapentin and Mobic as needed. For the acid reflux, I will give the patient refill of Protonix. She will come back for follow-up visit in 4 weeks for evaluation before the next cycle of her treatment. The patient voices understanding of current disease status and treatment options and is in agreement with the current care plan. All questions were answered. The patient knows to call the clinic with any problems, questions or concerns. We can certainly see the patient much sooner if necessary.  Disclaimer: This note was dictated with voice recognition software. Similar sounding words can inadvertently be transcribed and may not be corrected upon review.

## 2020-09-23 NOTE — Patient Instructions (Signed)
Implanted Port Insertion, Care After This sheet gives you information about how to care for yourself after your procedure. Your health care provider may also give you more specific instructions. If you have problems or questions, contact your health care provider. What can I expect after the procedure? After the procedure, it is common to have:  Discomfort at the port insertion site.  Bruising on the skin over the port. This should improve over 3-4 days. Follow these instructions at home: Port care  After your port is placed, you will get a manufacturer's information card. The card has information about your port. Keep this card with you at all times.  Take care of the port as told by your health care provider. Ask your health care provider if you or a family member can get training for taking care of the port at home. A home health care nurse may also take care of the port.  Make sure to remember what type of port you have. Incision care  Follow instructions from your health care provider about how to take care of your port insertion site. Make sure you: ? Wash your hands with soap and water before and after you change your bandage (dressing). If soap and water are not available, use hand sanitizer. ? Change your dressing as told by your health care provider. ? Leave stitches (sutures), skin glue, or adhesive strips in place. These skin closures may need to stay in place for 2 weeks or longer. If adhesive strip edges start to loosen and curl up, you may trim the loose edges. Do not remove adhesive strips completely unless your health care provider tells you to do that.  Check your port insertion site every day for signs of infection. Check for: ? Redness, swelling, or pain. ? Fluid or blood. ? Warmth. ? Pus or a bad smell.      Activity  Return to your normal activities as told by your health care provider. Ask your health care provider what activities are safe for you.  Do not  lift anything that is heavier than 10 lb (4.5 kg), or the limit that you are told, until your health care provider says that it is safe. General instructions  Take over-the-counter and prescription medicines only as told by your health care provider.  Do not take baths, swim, or use a hot tub until your health care provider approves. Ask your health care provider if you may take showers. You may only be allowed to take sponge baths.  Do not drive for 24 hours if you were given a sedative during your procedure.  Wear a medical alert bracelet in case of an emergency. This will tell any health care providers that you have a port.  Keep all follow-up visits as told by your health care provider. This is important. Contact a health care provider if:  You cannot flush your port with saline as directed, or you cannot draw blood from the port.  You have a fever or chills.  You have redness, swelling, or pain around your port insertion site.  You have fluid or blood coming from your port insertion site.  Your port insertion site feels warm to the touch.  You have pus or a bad smell coming from the port insertion site. Get help right away if:  You have chest pain or shortness of breath.  You have bleeding from your port that you cannot control. Summary  Take care of the port as told by your   health care provider. Keep the manufacturer's information card with you at all times.  Change your dressing as told by your health care provider.  Contact a health care provider if you have a fever or chills or if you have redness, swelling, or pain around your port insertion site.  Keep all follow-up visits as told by your health care provider. This information is not intended to replace advice given to you by your health care provider. Make sure you discuss any questions you have with your health care provider. Document Revised: 12/20/2017 Document Reviewed: 12/20/2017 Elsevier Patient Education   2021 Elsevier Inc.  

## 2020-09-23 NOTE — Progress Notes (Signed)
Nutrition Follow-up:  Patient with stage IV non small cell lung cancer metastatic to bone. Patient is currently receiving immunotherapy.   Met with patient during infusion. She reports feeling tired, but doing okay today. Patient reports appetite is a little better. She does not like the taste of meat, recalls eating tomato soup that her sister makes, chicken soup, rice, beans, vegetables, occasionally fish, and drinks 1 Ensure. She is asking about the sugar in potatoes and yogurt, reports being concerned about eating foods that feed cancer.  Medications: reviewed  Labs: Na 132, Glucose 135, Albumin 2.6  Anthropometrics: Weight 126 lb 11.2 oz today stable over the last 2 weeks, down 5 lbs in the last month  4/5 - 126 lb 3/23 - 128 lb 6.4 oz 3/16 - 131 lb 11.2 oz 2/15 - 135 lb 6.4 oz   NUTRITION DIAGNOSIS: Inadequate oral intake continues   INTERVENTION:  Discussed foods with protein, encouraged patient to include protein with meals/snacks Educated on sugar and cancer, encouraged intake of fruits and vegetables, fact sheet provided Discussed strategies for adding calories and protein to foods, handout provided Encouraged high calorie, high protein snacks, handout provided   Patient will drink 2 Ensure Enlive daily Ensure coupons given Sample of [employer] provided RD contact information provided  MONITORING, EVALUATION, GOAL: weight trends, intake   NEXT VISIT: Wednesday May 18

## 2020-09-23 NOTE — Progress Notes (Signed)
Ok to treat with HR of 103 per Curt Bears, MD.

## 2020-09-24 DIAGNOSIS — C3491 Malignant neoplasm of unspecified part of right bronchus or lung: Secondary | ICD-10-CM | POA: Diagnosis not present

## 2020-09-24 DIAGNOSIS — Z438 Encounter for attention to other artificial openings: Secondary | ICD-10-CM | POA: Diagnosis not present

## 2020-09-24 DIAGNOSIS — J910 Malignant pleural effusion: Secondary | ICD-10-CM | POA: Diagnosis not present

## 2020-09-24 DIAGNOSIS — C7951 Secondary malignant neoplasm of bone: Secondary | ICD-10-CM | POA: Diagnosis not present

## 2020-09-24 DIAGNOSIS — I10 Essential (primary) hypertension: Secondary | ICD-10-CM | POA: Diagnosis not present

## 2020-09-24 DIAGNOSIS — D630 Anemia in neoplastic disease: Secondary | ICD-10-CM | POA: Diagnosis not present

## 2020-09-30 DIAGNOSIS — J910 Malignant pleural effusion: Secondary | ICD-10-CM | POA: Diagnosis not present

## 2020-09-30 DIAGNOSIS — C3491 Malignant neoplasm of unspecified part of right bronchus or lung: Secondary | ICD-10-CM | POA: Diagnosis not present

## 2020-09-30 DIAGNOSIS — D630 Anemia in neoplastic disease: Secondary | ICD-10-CM | POA: Diagnosis not present

## 2020-09-30 DIAGNOSIS — C7951 Secondary malignant neoplasm of bone: Secondary | ICD-10-CM | POA: Diagnosis not present

## 2020-09-30 DIAGNOSIS — Z438 Encounter for attention to other artificial openings: Secondary | ICD-10-CM | POA: Diagnosis not present

## 2020-09-30 DIAGNOSIS — I10 Essential (primary) hypertension: Secondary | ICD-10-CM | POA: Diagnosis not present

## 2020-10-03 ENCOUNTER — Telehealth: Payer: Self-pay

## 2020-10-03 NOTE — Telephone Encounter (Signed)
Pt LM stating she has dry/rough skin under her arm and on her nose. Pt was advised to keep it moisturized.  Pt also states she is "suffocating". Pt was advised to go to the ER and she has declined. Pt states even after her thoracentesis, she still feels like she is "suffocating". I ask the pt if this feel is worse of her baseline. She says this how she always feels so he will not go to the ER for this but voiced understanding if her sx worsen to go to the ER.

## 2020-10-06 ENCOUNTER — Telehealth: Payer: Self-pay

## 2020-10-06 ENCOUNTER — Other Ambulatory Visit: Payer: Self-pay | Admitting: *Deleted

## 2020-10-06 DIAGNOSIS — C3491 Malignant neoplasm of unspecified part of right bronchus or lung: Secondary | ICD-10-CM | POA: Diagnosis not present

## 2020-10-06 DIAGNOSIS — C7951 Secondary malignant neoplasm of bone: Secondary | ICD-10-CM | POA: Diagnosis not present

## 2020-10-06 DIAGNOSIS — J90 Pleural effusion, not elsewhere classified: Secondary | ICD-10-CM

## 2020-10-06 DIAGNOSIS — D630 Anemia in neoplastic disease: Secondary | ICD-10-CM | POA: Diagnosis not present

## 2020-10-06 DIAGNOSIS — I10 Essential (primary) hypertension: Secondary | ICD-10-CM | POA: Diagnosis not present

## 2020-10-06 DIAGNOSIS — Z438 Encounter for attention to other artificial openings: Secondary | ICD-10-CM | POA: Diagnosis not present

## 2020-10-06 DIAGNOSIS — J910 Malignant pleural effusion: Secondary | ICD-10-CM | POA: Diagnosis not present

## 2020-10-06 NOTE — Telephone Encounter (Signed)
1.  Okay verbal order for dressing changes.  If the area gets worse I need to know. 2.  Recommend palliative care referral. (Will let Dr. Julien Nordmann know)

## 2020-10-06 NOTE — Telephone Encounter (Signed)
Denise nurse from [facility] called wanting verbal orders for pt for dressing changing two x per week for coccyx lesion (she stated that it is small, but very painful and red) she also wants verbal orders for Education officer, museum for Passive vs. Palliative. She sated that patient is not really taking medications the way she should she get very nauseous and has lost a lot of weight due to her nutritional intake has worsening. Please advise. -JMA    Denise Call back number: [phone]

## 2020-10-06 NOTE — Progress Notes (Signed)
Valerie Long home health nurse, Valerie Long, contacted the office stating Valerie Long right pleurx catheter has not drained anything the past three drainage sessions. Patient is currently draining every three days. Advised home health nurse to change drainage schedule to once a week. Patient contacted and provided with an appointment date and time with chest xray prior. Patient verbalized understanding.

## 2020-10-07 NOTE — Telephone Encounter (Signed)
Called Denise and left detailed VM, if any questions left number to call office back. -Jma

## 2020-10-08 DIAGNOSIS — R600 Localized edema: Secondary | ICD-10-CM | POA: Diagnosis not present

## 2020-10-08 DIAGNOSIS — J910 Malignant pleural effusion: Secondary | ICD-10-CM | POA: Diagnosis not present

## 2020-10-08 DIAGNOSIS — C3491 Malignant neoplasm of unspecified part of right bronchus or lung: Secondary | ICD-10-CM | POA: Diagnosis not present

## 2020-10-08 DIAGNOSIS — Z4803 Encounter for change or removal of drains: Secondary | ICD-10-CM | POA: Diagnosis not present

## 2020-10-08 DIAGNOSIS — K8020 Calculus of gallbladder without cholecystitis without obstruction: Secondary | ICD-10-CM | POA: Diagnosis not present

## 2020-10-08 DIAGNOSIS — R5081 Fever presenting with conditions classified elsewhere: Secondary | ICD-10-CM | POA: Diagnosis not present

## 2020-10-08 DIAGNOSIS — E7800 Pure hypercholesterolemia, unspecified: Secondary | ICD-10-CM | POA: Diagnosis not present

## 2020-10-08 DIAGNOSIS — I10 Essential (primary) hypertension: Secondary | ICD-10-CM | POA: Diagnosis not present

## 2020-10-08 DIAGNOSIS — D630 Anemia in neoplastic disease: Secondary | ICD-10-CM | POA: Diagnosis not present

## 2020-10-08 DIAGNOSIS — M1990 Unspecified osteoarthritis, unspecified site: Secondary | ICD-10-CM | POA: Diagnosis not present

## 2020-10-08 DIAGNOSIS — I700 Atherosclerosis of aorta: Secondary | ICD-10-CM | POA: Diagnosis not present

## 2020-10-08 DIAGNOSIS — L89152 Pressure ulcer of sacral region, stage 2: Secondary | ICD-10-CM | POA: Diagnosis not present

## 2020-10-08 DIAGNOSIS — Z48813 Encounter for surgical aftercare following surgery on the respiratory system: Secondary | ICD-10-CM | POA: Diagnosis not present

## 2020-10-08 DIAGNOSIS — Z9181 History of falling: Secondary | ICD-10-CM | POA: Diagnosis not present

## 2020-10-08 DIAGNOSIS — D709 Neutropenia, unspecified: Secondary | ICD-10-CM | POA: Diagnosis not present

## 2020-10-08 DIAGNOSIS — Z4801 Encounter for change or removal of surgical wound dressing: Secondary | ICD-10-CM | POA: Diagnosis not present

## 2020-10-08 DIAGNOSIS — M8580 Other specified disorders of bone density and structure, unspecified site: Secondary | ICD-10-CM | POA: Diagnosis not present

## 2020-10-08 DIAGNOSIS — C7951 Secondary malignant neoplasm of bone: Secondary | ICD-10-CM | POA: Diagnosis not present

## 2020-10-10 ENCOUNTER — Other Ambulatory Visit: Payer: Self-pay | Admitting: Internal Medicine

## 2020-10-15 ENCOUNTER — Other Ambulatory Visit: Payer: Self-pay | Admitting: Medical Oncology

## 2020-10-15 ENCOUNTER — Ambulatory Visit: Payer: Medicare Other

## 2020-10-15 ENCOUNTER — Other Ambulatory Visit: Payer: Medicare Other

## 2020-10-15 ENCOUNTER — Telehealth: Payer: Self-pay | Admitting: Medical Oncology

## 2020-10-15 ENCOUNTER — Other Ambulatory Visit: Payer: Self-pay | Admitting: Internal Medicine

## 2020-10-15 ENCOUNTER — Ambulatory Visit: Payer: Medicare Other | Admitting: Internal Medicine

## 2020-10-15 ENCOUNTER — Telehealth: Payer: Self-pay | Admitting: Internal Medicine

## 2020-10-15 DIAGNOSIS — J910 Malignant pleural effusion: Secondary | ICD-10-CM | POA: Diagnosis not present

## 2020-10-15 DIAGNOSIS — Z4803 Encounter for change or removal of drains: Secondary | ICD-10-CM | POA: Diagnosis not present

## 2020-10-15 DIAGNOSIS — Z48813 Encounter for surgical aftercare following surgery on the respiratory system: Secondary | ICD-10-CM | POA: Diagnosis not present

## 2020-10-15 DIAGNOSIS — C3491 Malignant neoplasm of unspecified part of right bronchus or lung: Secondary | ICD-10-CM | POA: Diagnosis not present

## 2020-10-15 DIAGNOSIS — D630 Anemia in neoplastic disease: Secondary | ICD-10-CM | POA: Diagnosis not present

## 2020-10-15 DIAGNOSIS — C7951 Secondary malignant neoplasm of bone: Secondary | ICD-10-CM | POA: Diagnosis not present

## 2020-10-15 MED ORDER — FLUCONAZOLE 100 MG PO TABS: 100.0000 mg | ORAL_TABLET | Freq: Every day | ORAL | 0 refills | Status: DC

## 2020-10-15 NOTE — Telephone Encounter (Signed)
Denise from [facility] is calling stating pt blood pressure is a little 148/90 states she is having a problem drinking water pt is mostly just drinking soups and she feels the pt is overloading on sodium because she is also drinking a bottle of pedialyte a day which is 1000 milligrams of sodium and also the soup. Would like to know if Dr. Larose Kells wants to do anything about it. States she already instructed her to lower sodium intake and will continue to monitor her B/P/ Please call Langley Gauss if need be at [phone]

## 2020-10-15 NOTE — Telephone Encounter (Signed)
Please advise in PCP absence.  

## 2020-10-15 NOTE — Telephone Encounter (Signed)
LVM that Dr Julien Nordmann sent in rx for Diflucan .

## 2020-10-15 NOTE — Telephone Encounter (Signed)
Pt has oral thrush and requests medication Mohamed to advise.  Her BP has been trending up. I told Langley Gauss to notify Dr Larose Kells her PCP.

## 2020-10-16 ENCOUNTER — Inpatient Hospital Stay (HOSPITAL_COMMUNITY)
Admission: EM | Admit: 2020-10-16 | Discharge: 2020-10-20 | DRG: 641 | Disposition: A | Discharge status: Home Health | Payer: Medicare Other | Attending: Internal Medicine | Admitting: Internal Medicine

## 2020-10-16 ENCOUNTER — Encounter: Payer: Medicare Other | Admitting: Medical

## 2020-10-16 ENCOUNTER — Emergency Department (HOSPITAL_COMMUNITY): Payer: Medicare Other

## 2020-10-16 ENCOUNTER — Other Ambulatory Visit: Payer: Medicare Other

## 2020-10-16 ENCOUNTER — Encounter (HOSPITAL_COMMUNITY): Payer: Self-pay

## 2020-10-16 ENCOUNTER — Telehealth: Payer: Self-pay | Admitting: Family Medicine

## 2020-10-16 ENCOUNTER — Telehealth: Payer: Self-pay | Admitting: Internal Medicine

## 2020-10-16 ENCOUNTER — Telehealth: Payer: Self-pay | Admitting: Medical Oncology

## 2020-10-16 ENCOUNTER — Other Ambulatory Visit: Payer: Self-pay

## 2020-10-16 DIAGNOSIS — B370 Candidal stomatitis: Secondary | ICD-10-CM | POA: Diagnosis not present

## 2020-10-16 DIAGNOSIS — R911 Solitary pulmonary nodule: Secondary | ICD-10-CM | POA: Diagnosis not present

## 2020-10-16 DIAGNOSIS — E860 Dehydration: Secondary | ICD-10-CM | POA: Diagnosis present

## 2020-10-16 DIAGNOSIS — R0602 Shortness of breath: Secondary | ICD-10-CM

## 2020-10-16 DIAGNOSIS — C7951 Secondary malignant neoplasm of bone: Secondary | ICD-10-CM | POA: Diagnosis present

## 2020-10-16 DIAGNOSIS — J9811 Atelectasis: Secondary | ICD-10-CM | POA: Diagnosis not present

## 2020-10-16 DIAGNOSIS — Z7189 Other specified counseling: Secondary | ICD-10-CM

## 2020-10-16 DIAGNOSIS — C3491 Malignant neoplasm of unspecified part of right bronchus or lung: Secondary | ICD-10-CM | POA: Diagnosis not present

## 2020-10-16 DIAGNOSIS — E7800 Pure hypercholesterolemia, unspecified: Secondary | ICD-10-CM | POA: Diagnosis present

## 2020-10-16 DIAGNOSIS — J910 Malignant pleural effusion: Secondary | ICD-10-CM

## 2020-10-16 DIAGNOSIS — I10 Essential (primary) hypertension: Secondary | ICD-10-CM | POA: Diagnosis not present

## 2020-10-16 DIAGNOSIS — Z803 Family history of malignant neoplasm of breast: Secondary | ICD-10-CM

## 2020-10-16 DIAGNOSIS — Z20822 Contact with and (suspected) exposure to covid-19: Secondary | ICD-10-CM | POA: Diagnosis present

## 2020-10-16 DIAGNOSIS — J90 Pleural effusion, not elsewhere classified: Secondary | ICD-10-CM | POA: Diagnosis not present

## 2020-10-16 DIAGNOSIS — Z66 Do not resuscitate: Secondary | ICD-10-CM | POA: Diagnosis present

## 2020-10-16 DIAGNOSIS — E871 Hypo-osmolality and hyponatremia: Secondary | ICD-10-CM | POA: Diagnosis not present

## 2020-10-16 DIAGNOSIS — C3490 Malignant neoplasm of unspecified part of unspecified bronchus or lung: Secondary | ICD-10-CM | POA: Diagnosis not present

## 2020-10-16 DIAGNOSIS — R030 Elevated blood-pressure reading, without diagnosis of hypertension: Secondary | ICD-10-CM

## 2020-10-16 DIAGNOSIS — M8580 Other specified disorders of bone density and structure, unspecified site: Secondary | ICD-10-CM | POA: Diagnosis present

## 2020-10-16 DIAGNOSIS — Z79899 Other long term (current) drug therapy: Secondary | ICD-10-CM

## 2020-10-16 DIAGNOSIS — Z8249 Family history of ischemic heart disease and other diseases of the circulatory system: Secondary | ICD-10-CM

## 2020-10-16 DIAGNOSIS — M1990 Unspecified osteoarthritis, unspecified site: Secondary | ICD-10-CM | POA: Diagnosis present

## 2020-10-16 DIAGNOSIS — R0902 Hypoxemia: Secondary | ICD-10-CM | POA: Diagnosis not present

## 2020-10-16 LAB — CBC WITH DIFFERENTIAL/PLATELET
Abs Immature Granulocytes: 0.06 10*3/uL (ref 0.00–0.07)
Basophils Absolute: 0 10*3/uL (ref 0.0–0.1)
Basophils Relative: 0 %
Eosinophils Absolute: 0 10*3/uL (ref 0.0–0.5)
Eosinophils Relative: 0 %
HCT: 38 % (ref 36.0–46.0)
Hemoglobin: 11.9 g/dL — ABNORMAL LOW (ref 12.0–15.0)
Immature Granulocytes: 1 %
Lymphocytes Relative: 8 %
Lymphs Abs: 0.5 10*3/uL — ABNORMAL LOW (ref 0.7–4.0)
MCH: 26.6 pg (ref 26.0–34.0)
MCHC: 31.3 g/dL (ref 30.0–36.0)
MCV: 84.8 fL (ref 80.0–100.0)
Monocytes Absolute: 0.5 10*3/uL (ref 0.1–1.0)
Monocytes Relative: 7 %
Neutro Abs: 6 10*3/uL (ref 1.7–7.7)
Neutrophils Relative %: 84 %
Platelets: 335 10*3/uL (ref 150–400)
RBC: 4.48 MIL/uL (ref 3.87–5.11)
RDW: 16.1 % — ABNORMAL HIGH (ref 11.5–15.5)
WBC: 7.1 10*3/uL (ref 4.0–10.5)
nRBC: 0 % (ref 0.0–0.2)

## 2020-10-16 LAB — URINALYSIS, ROUTINE W REFLEX MICROSCOPIC
Bilirubin Urine: NEGATIVE
Glucose, UA: NEGATIVE mg/dL
Hgb urine dipstick: NEGATIVE
Ketones, ur: 20 mg/dL — AB
Leukocytes,Ua: NEGATIVE
Nitrite: NEGATIVE
Protein, ur: NEGATIVE mg/dL
Specific Gravity, Urine: 1.016 (ref 1.005–1.030)
pH: 9 — ABNORMAL HIGH (ref 5.0–8.0)

## 2020-10-16 LAB — COMPREHENSIVE METABOLIC PANEL
ALT: 15 U/L (ref 0–44)
AST: 31 U/L (ref 15–41)
Albumin: 3.2 g/dL — ABNORMAL LOW (ref 3.5–5.0)
Alkaline Phosphatase: 311 U/L — ABNORMAL HIGH (ref 38–126)
Anion gap: 9 (ref 5–15)
BUN: 12 mg/dL (ref 8–23)
CO2: 31 mmol/L (ref 22–32)
Calcium: 9.3 mg/dL (ref 8.9–10.3)
Chloride: 90 mmol/L — ABNORMAL LOW (ref 98–111)
Creatinine, Ser: 0.77 mg/dL (ref 0.44–1.00)
GFR, Estimated: >60 mL/min (ref >60)
Glucose, Bld: 105 mg/dL — ABNORMAL HIGH (ref 70–99)
Potassium: 4.7 mmol/L (ref 3.5–5.1)
Sodium: 130 mmol/L — ABNORMAL LOW (ref 135–145)
Total Bilirubin: 0.9 mg/dL (ref 0.3–1.2)
Total Protein: 7.6 g/dL (ref 6.5–8.1)

## 2020-10-16 LAB — BRAIN NATRIURETIC PEPTIDE: B Natriuretic Peptide: 38.3 pg/mL (ref 0.0–100.0)

## 2020-10-16 MED ORDER — FLUCONAZOLE 40 MG/ML PO SUSR
100.0000 mg | Freq: Once | ORAL | Status: AC
  Administered 2020-10-16: 100 mg via ORAL
  Filled 2020-10-16 (×2): qty 2.5

## 2020-10-16 MED ORDER — AMLODIPINE BESYLATE 2.5 MG PO TABS: 2.5000 mg | ORAL_TABLET | Freq: Every day | ORAL | 6 refills | Status: AC

## 2020-10-16 MED ORDER — ONDANSETRON HCL 4 MG/2ML IJ SOLN
4.0000 mg | Freq: Once | INTRAMUSCULAR | Status: AC
Start: 2020-10-16 — End: 2020-10-16
  Administered 2020-10-16: 4 mg via INTRAVENOUS
  Filled 2020-10-16: qty 2

## 2020-10-16 MED ORDER — ACETAMINOPHEN 500 MG PO TABS
1000.0000 mg | ORAL_TABLET | Freq: Four times a day (QID) | ORAL | Status: DC | PRN
Start: 2020-10-16 — End: 2020-10-20
  Administered 2020-10-16 – 2020-10-20 (×8): 1000 mg via ORAL
  Filled 2020-10-16 (×8): qty 2

## 2020-10-16 MED ORDER — OXYCODONE HCL 5 MG PO TABS
5.0000 mg | ORAL_TABLET | Freq: Four times a day (QID) | ORAL | Status: DC | PRN
  Administered 2020-10-16: 5 mg via ORAL
  Filled 2020-10-16: qty 1

## 2020-10-16 MED ORDER — SENNOSIDES-DOCUSATE SODIUM 8.6-50 MG PO TABS: 1.0000 | ORAL_TABLET | Freq: Every evening | ORAL | Status: DC | PRN

## 2020-10-16 MED ORDER — ONDANSETRON HCL 4 MG PO TABS
4.0000 mg | ORAL_TABLET | Freq: Four times a day (QID) | ORAL | Status: DC | PRN
Start: 2020-10-16 — End: 2020-10-20
  Administered 2020-10-19: 4 mg via ORAL
  Filled 2020-10-16: qty 1

## 2020-10-16 MED ORDER — ACETAMINOPHEN 325 MG PO TABS: 650.0000 mg | ORAL_TABLET | Freq: Four times a day (QID) | ORAL | Status: DC | PRN

## 2020-10-16 MED ORDER — ACETAMINOPHEN 650 MG RE SUPP: 650.0000 mg | Freq: Four times a day (QID) | RECTAL | Status: DC | PRN

## 2020-10-16 MED ORDER — LACTATED RINGERS IV SOLN
INTRAVENOUS | Status: AC
  Administered 2020-10-16: 50 mL/h via INTRAVENOUS

## 2020-10-16 MED ORDER — ONDANSETRON HCL 4 MG/2ML IJ SOLN: 4.0000 mg | Freq: Four times a day (QID) | INTRAMUSCULAR | Status: DC | PRN

## 2020-10-16 MED ORDER — POLYVINYL ALCOHOL 1.4 % OP SOLN
1.0000 [drp] | Freq: Two times a day (BID) | OPHTHALMIC | Status: DC
  Administered 2020-10-16 – 2020-10-20 (×6): 1 [drp] via OPHTHALMIC
  Filled 2020-10-16: qty 15

## 2020-10-16 MED ORDER — IOHEXOL 350 MG/ML SOLN
100.0000 mL | Freq: Once | INTRAVENOUS | Status: AC | PRN
  Administered 2020-10-16: 100 mL via INTRAVENOUS

## 2020-10-16 MED ORDER — FLUCONAZOLE 40 MG/ML PO SUSR
200.0000 mg | Freq: Every day | ORAL | Status: DC
  Administered 2020-10-17 – 2020-10-20 (×4): 200 mg via ORAL
  Filled 2020-10-16 (×4): qty 5

## 2020-10-16 NOTE — ED Notes (Signed)
Pt aware urine sample is needed. Bedside commode in room.

## 2020-10-16 NOTE — Telephone Encounter (Signed)
Sister notified to pick up diflucan.   Reports pt has periodic episodes of not able to catch her breath after drinking water or vomiting. It resolves "after a minute".  She also stated her BP systolic has been in low 810'F. Schedule message sent to see Lucianne Lei today

## 2020-10-16 NOTE — ED Notes (Signed)
Family at bedside. 

## 2020-10-16 NOTE — H&P (Signed)
History and Physical    [name]:[id number] DOB: 1945-07-11 DOA: 10/16/2020  PCP: Colon Branch, MD  Patient coming from: Home via EMS  I have personally briefly reviewed patient's old medical records in [place]  Chief Complaint: Shortness of breath  HPI: Valerie Long is a 75 y.o. 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  Patient states she has been having worsening shortness of breath over the last month.  Dyspnea occurs with exertion and while at rest.  She has had orthopnea.  She has had cough productive of clear sputum.  She has a right Pleurx catheter in place and notes that amount of output appears to be decreasing.  Drainage that occurs appears bloody.  Over the last few days she has also noted oral pain.  She has had difficulty maintaining adequate oral intake due to pain with swallowing foods and liquids.  She does not experience any sensation of food being getting stuck in her chest.  She was noted to have oral thrush and a prescription for Diflucan was sent yesterday (5/11) but she has not yet picked this up.  She is feeling thirsty and dehydrated.  She has been having subjective fevers with some episodes of nausea and emesis.  She has not had any chest pain, abdominal pain, dysuria, or diarrhea.  ED Course:  Initial vitals showed BP 160/93, pulse 112, RR 22, temp 96.8 F, SPO2 98% on room air.  Labs show sodium 130, potassium 4.7, bicarb 31, BUN 12, creatinine 0.77, serum glucose 105, AST 31, ALT 15, alk phos 311, total bilirubin 0.9, BNP 38.3.  Urinalysis negative for UTI.  Portable chest x-ray showed stable bilateral pleural effusions and bilateral lung nodules.  CTA chest was negative for PE.  Mild increase in right pleural effusion  and stable left pleural effusion noted.  Multiple bilateral lung masses and nodules noted without significant change.  Stable extensive sclerotic bone metastases noted.  Patient was given oral Diflucan and IV Zofran.  EDP discussed with oncology, Dr. Julien Nordmann who will follow and CT surgery who recommended patient may be admitted to [facility].  The hospitalist service was consulted to admit for further evaluation and management.  Review of Systems: All systems reviewed and are negative except as documented in history of present illness above.   Past Medical History:  Diagnosis Date  . Adenocarcinoma of right lung, stage 4 ([place]) 2017   lump nodes  . Anemia   . Arthritis   . Bone metastases ([place]) 02/24/2017  . Encounter for antineoplastic chemotherapy 03/17/2016  . Hypercholesterolemia   . Hypertension 06/18/2016   just when I go to the hospital   . Osteopenia   . 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 Pneumonia   . Shortness of breath dyspnea   . 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 CESAREAN SECTION     myomectomy  . CHEST TUBE INSERTION Right 08/08/2020   Procedure: INSERTION PLEURAL DRAINAGE CATHETER;  Surgeon: Melrose Nakayama, MD;  Location: [facility];  Service: Thoracic;  Laterality: Right;  . COLONOSCOPY W/ POLYPECTOMY    . IR GENERIC HISTORICAL  08/17/2016   IR US GUIDE VASC ACCESS RIGHT 08/17/2016 [name], MD  [facility]  . IR GENERIC HISTORICAL  08/17/2016   IR FLUORO GUIDE PORT INSERTION RIGHT 08/17/2016 [name], MD [facility]  . MEDIASTINOSCOPY N/A 09/25/2015   Procedure: MEDIASTINOSCOPY;  Surgeon: Melrose Nakayama, MD;  Location: [facility] [place];  Service: Thoracic;  Laterality: N/A;  . VIDEO BRONCHOSCOPY Bilateral 08/19/2015   Procedure: VIDEO BRONCHOSCOPY WITH FLUORO;  Surgeon: Rigoberto Noel, MD;  Location: [facility];  Service: Cardiopulmonary;  Laterality: Bilateral;  . VIDEO  BRONCHOSCOPY N/A 09/08/2015   Procedure: VIDEO BRONCHOSCOPY WITH FLUORO;  Surgeon: Rigoberto Noel, MD;  Location: [facility];  Service: Thoracic;  Laterality: N/A;  . VIDEO BRONCHOSCOPY WITH ENDOBRONCHIAL ULTRASOUND Right 09/08/2015   Procedure: ATTEMPTED VIDEO BRONCHOSCOPY ENDOBRONCHIAL ULTRASOUND  ;  Surgeon: Rigoberto Noel, MD;  Location: [place];  Service: Thoracic;  Laterality: Right;    Social History:  reports that she has never smoked. She has never used smokeless tobacco. She reports previous alcohol use. She reports that she does not use drugs.  No Known Allergies  Family History  Problem Relation Age of Onset  . Hypertension Mother        F and M  . CAD Father   . Diabetes Other        auncle-aunts   . Cancer Other        UTERINE   . Breast cancer Sister   . Stroke Neg Hx   . Colon cancer Neg Hx      Prior to Admission medications   Medication Sig Start Date End Date Taking? Authorizing Provider  acetaminophen (TYLENOL) 500 MG tablet Take 1,000 mg by mouth every 6 (six) hours as needed for moderate pain or mild pain.    [provider]  amLODipine (NORVASC) 2.5 MG tablet Take 1 tablet (2.5 mg total) by mouth daily. 10/16/20   Copland, Gay Filler, MD  Cholecalciferol (VITAMIN D3 PO) Take 1,200 mg by mouth daily.    [provider]  cyclobenzaprine (FLEXERIL) 10 MG tablet Take 1 tablet (10 mg total) by mouth 2 (two) times daily as needed for muscle spasms. 09/09/20   Gareth Morgan, MD  dronabinol (MARINOL) 2.5 MG capsule Take 1 capsule (2.5 mg total) by mouth 2 (two) times daily before a meal. 07/16/20   Curt Bears, MD  Ferrous Sulfate (IRON PO) Take 352 mg by mouth daily.    [provider]  fluconazole (DIFLUCAN) 100 MG tablet Take 1 tablet (100 mg total) by mouth daily. 10/15/20   Curt Bears, MD  Glycerin-Hypromellose-PEG 400 (DRY EYE RELIEF DROPS) 0.2-0.2-1 % SOLN Place 1 drop into both eyes 2 (two) times daily.    [provider]   hydrOXYzine (ATARAX/VISTARIL) 25 MG tablet Take 2 tablets (50 mg total) by mouth every 8 (eight) hours as needed for anxiety, nausea or vomiting (before bed for sleep). 09/09/20   Gareth Morgan, MD  lidocaine-prilocaine (EMLA) cream Apply 1 application topically daily as needed (every four weeks).    [provider]  methylPREDNISolone (MEDROL DOSEPAK) 4 MG TBPK tablet Use as instructed. 08/20/20   Curt Bears, MD  Omega-3 1000 MG CAPS Take 1,000 mg by mouth daily as needed (sore throat).    [provider]  ondansetron (ZOFRAN ODT) 4 MG disintegrating tablet Take 1 tablet (4 mg total) by mouth every 8 (eight) hours as needed for nausea or vomiting. 09/09/20   Gareth Morgan, MD  OVER THE COUNTER MEDICATION Apply 1 application topically daily as needed (pain). amish origins  [provider]  pantoprazole (PROTONIX) 20 MG tablet Take 2 tablets (40 mg total) by mouth daily for 14 days. 09/23/20 10/07/20  Curt Bears, MD  prochlorperazine (COMPAZINE) 10 MG tablet TAKE 1 TABLET BY MOUTH EVERY 6 HOURS AS NEEDED FOR NAUSEA OR VOMITING. 10/13/20   Curt Bears, MD  traMADol (ULTRAM) 50 MG tablet Take 1 tablet (50 mg total) by mouth every 6 (six) hours as needed for moderate pain or severe pain. 08/08/20   Melrose Nakayama, MD    Physical Exam: Vitals:   10/16/20 1600 10/16/20 1630 10/16/20 1700 10/16/20 1800  BP: 133/84 (!) 144/88 (!) 149/84 126/82  Pulse: (!) 108 (!) 111 (!) 108 (!) 110  Resp: 20 (!) 26 (!) 36 19  Temp:      TempSrc:      SpO2: 93% 93% 94% 94%   Constitutional: Thin woman resting in bed with head elevated, NAD, calm, comfortable Eyes: PERRL, lids and conjunctivae normal ENMT: Oral thrush present.  Mucous membranes are dry. Normal dentition.  Neck: normal, supple, no masses. Respiratory: Diminished breath sounds bilateral lung bases.  Normal respiratory effort. No accessory muscle use.  Cardiovascular: Tachycardic with regular rhythm, no  murmurs / rubs / gallops. No extremity edema. 2+ pedal pulses.  Port-A-Cath in place right chest wall.  Pleurx catheter in place right lower chest with protective dressing in place. Abdomen: no tenderness, no masses palpated.   Musculoskeletal: no clubbing / cyanosis. No joint deformity upper and lower extremities. Good ROM, no contractures. Normal muscle tone.  Skin: no rashes, lesions, ulcers. No induration Neurologic: CN 2-12 grossly intact. Sensation intact. Strength 5/5 in all 4.  Psychiatric: Normal judgment and insight. Alert and oriented x 3. Normal mood.   Labs on Admission: I have personally reviewed following labs and imaging studies  CBC: Recent Labs  Lab 10/16/20 1352  WBC 7.1  NEUTROABS 6.0  HGB 11.9*  HCT 38.0  MCV 84.8  PLT 748   Basic Metabolic Panel: Recent Labs  Lab 10/16/20 1352  NA 130*  K 4.7  CL 90*  CO2 31  GLUCOSE 105*  BUN 12  CREATININE 0.77  CALCIUM 9.3   GFR: CrCl cannot be calculated (Unknown ideal weight.). Liver Function Tests: Recent Labs  Lab 10/16/20 1352  AST 31  ALT 15  ALKPHOS 311*  BILITOT 0.9  PROT 7.6  ALBUMIN 3.2*   No results for input(s): LIPASE, AMYLASE in the last 168 hours. No results for input(s): AMMONIA in the last 168 hours. Coagulation Profile: No results for input(s): INR, PROTIME in the last 168 hours. Cardiac Enzymes: No results for input(s): CKTOTAL, CKMB, CKMBINDEX, TROPONINI in the last 168 hours. BNP (last 3 results) No results for input(s): PROBNP in the last 8760 hours. HbA1C: No results for input(s): HGBA1C in the last 72 hours. CBG: No results for input(s): GLUCAP in the last 168 hours. Lipid Profile: No results for input(s): CHOL, HDL, LDLCALC, TRIG, CHOLHDL, LDLDIRECT in the last 72 hours. Thyroid Function Tests: No results for input(s): TSH, T4TOTAL, FREET4, T3FREE, THYROIDAB in the last 72 hours. Anemia Panel: No results for input(s): VITAMINB12, FOLATE, FERRITIN, TIBC, IRON, RETICCTPCT  in the last 72 hours. Urine analysis:    Component Value Date/Time   COLORURINE STRAW (A) 10/16/2020 1720   APPEARANCEUR CLEAR 10/16/2020 1720   LABSPEC 1.016 10/16/2020 1720   PHURINE 9.0 (H) 10/16/2020 1720   GLUCOSEU NEGATIVE 10/16/2020 1720   HGBUR NEGATIVE 10/16/2020 1720   BILIRUBINUR NEGATIVE 10/16/2020 1720  BILIRUBINUR negative 04/21/2020 1155   KETONESUR 20 (A) 10/16/2020 1720   PROTEINUR NEGATIVE 10/16/2020 1720   UROBILINOGEN negative (A) 04/21/2020 1155   UROBILINOGEN 0.2 08/02/2014 0327   NITRITE NEGATIVE 10/16/2020 1720   LEUKOCYTESUR NEGATIVE 10/16/2020 1720    Radiological Exams on Admission: CT Angio Chest PE W/Cm &/Or Wo Cm  Result Date: 10/16/2020 CLINICAL DATA:  Shortness of breath.  Stage IV lung cancer. EXAM: CT ANGIOGRAPHY CHEST WITH CONTRAST TECHNIQUE: Multidetector CT imaging of the chest was performed using the standard protocol during bolus administration of intravenous contrast. Multiplanar CT image reconstructions and MIPs were obtained to evaluate the vascular anatomy. CONTRAST:  115m OMNIPAQUE IOHEXOL 350 MG/ML SOLN COMPARISON:  Six 6 09/09/2020. Portable chest obtained earlier today. FINDINGS: Cardiovascular: Satisfactory opacification of the pulmonary arteries to the segmental level. No evidence of pulmonary embolism. Normal heart size. No pericardial effusion. Mediastinum/Nodes: No enlarged mediastinal, hilar, or axillary lymph nodes. Thyroid gland, trachea, and esophagus demonstrate no significant findings. Lungs/Pleura: Moderate right pleural effusion with a mild increase in size. Stable moderate to large-sized left pleural effusion. A right pleural catheter remains in place. No pneumothorax. No significant change in multiple bilateral lung nodules and masses. The lung bases are not included in their entirety. Upper Abdomen: The minimal portion of the included liver is unremarkable. The remainder of the upper abdomen is not included. Musculoskeletal:  Extensive sclerotic bone lesions have not changed significantly. Review of the MIP images confirms the above findings. IMPRESSION: 1. No pulmonary emboli. 2. Mild increase in size of a right pleural effusion and stable left pleural effusion. 3. No significant change in multiple bilateral lung masses and nodules. 4. Stable extensive sclerotic bone metastases. Electronically Signed   By: SClaudie ReveringM.D.   On: 10/16/2020 17:49   DG Chest Port 1 View  Result Date: 10/16/2020 CLINICAL DATA:  Shortness of breath.  Stage IV lung cancer. EXAM: PORTABLE CHEST 1 VIEW COMPARISON:  09/22/2020. FINDINGS: Normal sized heart with an interval decrease in size. No significant change in bilateral pleural effusions and bilateral lung nodules. Mildly increased bibasilar atelectasis. Stable thoracolumbar scoliosis. IMPRESSION: 1. No acute abnormality. 2. Mildly increased bibasilar atelectasis. 3. Stable bilateral pleural effusions and bilateral lung nodules compatible with pulmonary metastases. Electronically Signed   By: SClaudie ReveringM.D.   On: 10/16/2020 15:05    EKG: Personally reviewed. Sinus tachycardia, rate 111, not significantly changed when compared to prior.  Assessment/Plan Principal Problem:   Pleural effusion, bilateral Active Problems:   Adenocarcinoma of right lung, stage 4 (H[facility])   Hyponatremia   Oral thrush   CJerilynn Feldmeieris a 75y.o. 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  Symptomatic malignant bilateral pleural effusions: S/p right Pleurx catheter placement 08/08/2020 by CT surgery, Dr. HRoxan Hockey  CT imaging shows mild increase in size of right pleural effusion, stable moderate to large size left pleural effusion. -Will  place order for therapeutic [place] thoracentesis for left pleural effusion -Follow-up with cardiothoracic surgery for right Pleurx management  Oral thrush: -Continue oral Diflucan  Hyponatremia/dehydration: Clinically likely related to dehydration from poor oral intake due to oral thrush.  Will place on gentle IV LR @ 50 mL/hour overnight, monitor closely given small effusions.  Stage IV right lung adenocarcinoma with pulmonary and osseous metastases:  Follows with oncology, Dr. Julien Nordmann.  Currently on active treatment with nivolumab s/p cycle 3 on 09/23/20.  DVT prophylaxis: SCDs Code Status: DNR, confirmed with patient Family Communication: Discussed with patient, she has discussed with family Disposition Plan: From home, dispo pending clinical progress Consults called: EDP discussed with on-call oncology and cardiothoracic surgery Level of care: Med-Surg Admission status:   Status is: Observation  The patient remains OBS appropriate and will d/c before 2 midnights.  Dispo: The patient is from: Home              Anticipated d/c is to: Home              Patient currently is not medically stable to d/c.   Difficult to place patient No  Zada Finders MD [facility]  If 7PM-7AM, please contact night-coverage [email or web address]  10/16/2020, 7:20 PM

## 2020-10-16 NOTE — ED Notes (Signed)
Patient was eating soup and is comfortable in bed. Fluids started.

## 2020-10-16 NOTE — Telephone Encounter (Signed)
Scheduled appts per 5/12 sch msg. Pt's sister is aware.

## 2020-10-16 NOTE — Telephone Encounter (Signed)
Called Valerie Long back- it looks like Valerie Long has advanced cancer but is not on palliative care as of yet.  The family is concerned about her BP- reading from earlier today 150/100 by home cuff  BP Readings from Last 3 Encounters:  09/23/20 (!) 146/79  09/22/20 (!) 151/79  09/10/20 136/78   Low sodium noted on recent labs so will avoid diuretic. Do not want to be aggressive given patient factors but can rx a low dose of amlodipine for her Would allow pt oral intake as she desires at this time and not restrict her.  They will let me know how she responds to this amlodipine dose   I called and left detailed message on machine for her sister Valerie Long regarding rx and pharmacy info  Meds ordered this encounter  Medications  . amLODipine (NORVASC) 2.5 MG tablet    Sig: Take 1 tablet (2.5 mg total) by mouth daily.    Dispense:  30 tablet    Refill:  6

## 2020-10-16 NOTE — Progress Notes (Signed)
Pt with request to change DNR status.  Valerie Long stated that she is alright with CPR and medications, but doesn't want to be intubated.  Hospitalist on call notified.

## 2020-10-16 NOTE — Progress Notes (Signed)
Received a call from bedside RN Carma Leaven regarding the patient changing her Code status from DNR to [facility].  Ok with doing CPR and receiving resuscitative meds but NOT intubation.  Requested RN to document.  Changes applied.

## 2020-10-16 NOTE — ED Provider Notes (Signed)
[facility] DEPT Provider Note   CSN: [id number] Arrival date & time: 10/16/20  1331     History Chief Complaint  Patient presents with  . Shortness of Breath    Valerie Long is a 75 y.o. female.  HPI Patient with multiple medical issues, most notably stage IV non-small cell lung cancer now presents with dyspnea, fatigue, mouth soreness.  She notes that she has had dyspnea for greater than 1 month.  She has a Pleurx catheter previously placed, but in spite of this continues to have worsening dyspnea, fatigue.  She was recently diagnosed with what sounds like oral candidiasis, prescribed Diflucan, but she has not yet obtained that medication.  She denies fever, has had vomiting, but no diarrhea.   Per oncology office note, 417 DIAGNOSIS: Stage IV (T1b, N2, M1b) non-small cell lung cancer, adenocarcinoma diagnosed in March 2017 and presented with right lower lobe lung nodule in addition to mediastinal lymphadenopathy and metastatic bone lesions.      Past Medical History:  Diagnosis Date  . Adenocarcinoma of right lung, stage 4 ([place]) 2017   lump nodes  . Anemia   . Arthritis   . Bone metastases ([place]) 02/24/2017  . Encounter for antineoplastic chemotherapy 03/17/2016  . Hypercholesterolemia   . Hypertension 06/18/2016   just when I go to the hospital   . Osteopenia   . 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 Pneumonia   . Shortness of breath dyspnea   . 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 Encounter for antineoplastic immunotherapy 07/16/2020  . Febrile neutropenia ([place]) 05/07/2020  . Pleural effusion on right 12/06/2019  . Port-A-Cath in place 01/12/2018  . Bone metastases ([place]) 02/24/2017  . 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 Hypertension 06/18/2016  . Encounter for antineoplastic chemotherapy 03/17/2016  . Adenocarcinoma of right lung, stage 4  ([place])   . PCP NOTES >>> 03/22/2015  . CTS (carpal tunnel syndrome) 12/06/2013  . Annual physical exam 08/29/2012  . Pain in joint, shoulder region 08/29/2012  . Vaginal atrophy 06/17/2011  . Osteopenia   . Hypercholesterolemia     Past Surgical History:  Procedure Laterality Date  . CESAREAN SECTION     myomectomy  . CHEST TUBE INSERTION Right 08/08/2020   Procedure: INSERTION PLEURAL DRAINAGE CATHETER;  Surgeon: Melrose Nakayama, MD;  Location: [facility];  Service: Thoracic;  Laterality: Right;  . COLONOSCOPY W/ POLYPECTOMY    . IR GENERIC HISTORICAL  08/17/2016   IR US GUIDE VASC ACCESS RIGHT 08/17/2016 [name], MD [facility]-INTER[facility] RAD  . IR GENERIC HISTORICAL  08/17/2016   IR FLUORO GUIDE PORT INSERTION RIGHT 08/17/2016 [name], MD [facility]  . MEDIASTINOSCOPY N/A 09/25/2015   Procedure: MEDIASTINOSCOPY;  Surgeon: Melrose Nakayama, MD;  Location: [facility];  Service: Thoracic;  Laterality: N/A;  . VIDEO BRONCHOSCOPY Bilateral 08/19/2015   Procedure: VIDEO BRONCHOSCOPY WITH FLUORO;  Surgeon: Rigoberto Noel, MD;  Location: [facility];  Service: Cardiopulmonary;  Laterality: Bilateral;  . VIDEO BRONCHOSCOPY N/A 09/08/2015   Procedure: VIDEO BRONCHOSCOPY WITH FLUORO;  Surgeon: Rigoberto Noel, MD;  Location: [facility];  Service: Thoracic;  Laterality: N/A;  . VIDEO BRONCHOSCOPY WITH ENDOBRONCHIAL ULTRASOUND Right 09/08/2015   Procedure: ATTEMPTED VIDEO BRONCHOSCOPY ENDOBRONCHIAL ULTRASOUND  ;  Surgeon: Rigoberto Noel, MD;  Location: [facility];  Service: Thoracic;  Laterality: Right;  OB History    Gravida  2   Para  1   Term      Preterm      AB  1   Living  1     SAB  1   IAB      Ectopic      Multiple      Live Births              Family History  Problem Relation Age of Onset  . Hypertension Mother        F and M  . CAD Father   . Diabetes Other        auncle-aunts   . Cancer Other        UTERINE   . Breast cancer Sister   . Stroke Neg Hx   . Colon  cancer Neg Hx     Social History   Tobacco Use  . Smoking status: Never Smoker  . Smokeless tobacco: Never Used  Vaping Use  . Vaping Use: Never used  Substance Use Topics  . Alcohol use: Not Currently    Alcohol/week: 0.0 standard drinks  . Drug use: No    Home Medications Prior to Admission medications   Medication Sig Start Date End Date Taking? Authorizing Provider  acetaminophen (TYLENOL) 500 MG tablet Take 1,000 mg by mouth every 6 (six) hours as needed for moderate pain or mild pain.    [provider]  amLODipine (NORVASC) 2.5 MG tablet Take 1 tablet (2.5 mg total) by mouth daily. 10/16/20   Copland, Gay Filler, MD  Cholecalciferol (VITAMIN D3 PO) Take 1,200 mg by mouth daily.    [provider]  cyclobenzaprine (FLEXERIL) 10 MG tablet Take 1 tablet (10 mg total) by mouth 2 (two) times daily as needed for muscle spasms. 09/09/20   Gareth Morgan, MD  dronabinol (MARINOL) 2.5 MG capsule Take 1 capsule (2.5 mg total) by mouth 2 (two) times daily before a meal. 07/16/20   Curt Bears, MD  Ferrous Sulfate (IRON PO) Take 352 mg by mouth daily.    [provider]  fluconazole (DIFLUCAN) 100 MG tablet Take 1 tablet (100 mg total) by mouth daily. 10/15/20   Curt Bears, MD  Glycerin-Hypromellose-PEG 400 (DRY EYE RELIEF DROPS) 0.2-0.2-1 % SOLN Place 1 drop into both eyes 2 (two) times daily.    [provider]  hydrOXYzine (ATARAX/VISTARIL) 25 MG tablet Take 2 tablets (50 mg total) by mouth every 8 (eight) hours as needed for anxiety, nausea or vomiting (before bed for sleep). 09/09/20   Gareth Morgan, MD  lidocaine-prilocaine (EMLA) cream Apply 1 application topically daily as needed (every four weeks).    [provider]  methylPREDNISolone (MEDROL DOSEPAK) 4 MG TBPK tablet Use as instructed. 08/20/20   Curt Bears, MD  Omega-3 1000 MG CAPS Take 1,000 mg by mouth daily as needed (sore throat).    [provider]   ondansetron (ZOFRAN ODT) 4 MG disintegrating tablet Take 1 tablet (4 mg total) by mouth every 8 (eight) hours as needed for nausea or vomiting. 09/09/20   Gareth Morgan, MD  OVER THE COUNTER MEDICATION Apply 1 application topically daily as needed (pain). amish origins    [provider]  pantoprazole (PROTONIX) 20 MG tablet Take 2 tablets (40 mg total) by mouth daily for 14 days. 09/23/20 10/07/20  Curt Bears, MD  prochlorperazine (COMPAZINE) 10 MG tablet TAKE 1 TABLET BY MOUTH EVERY 6 HOURS AS NEEDED FOR NAUSEA OR  VOMITING. 10/13/20   Curt Bears, MD  traMADol (ULTRAM) 50 MG tablet Take 1 tablet (50 mg total) by mouth every 6 (six) hours as needed for moderate pain or severe pain. 08/08/20   Melrose Nakayama, MD    Allergies    Patient has no known allergies.  Review of Systems   Review of Systems  Constitutional:       Per HPI, otherwise negative  HENT:       Per HPI, otherwise negative  Respiratory:       Per HPI, otherwise negative  Cardiovascular:       Per HPI, otherwise negative  Gastrointestinal: Positive for nausea and vomiting.  Endocrine:       Negative aside from HPI  Genitourinary:       Neg aside from HPI   Musculoskeletal:       Per HPI, otherwise negative  Skin: Negative.   Neurological: Negative for syncope.    Physical Exam Updated Vital Signs BP 126/82   Pulse (!) 110   Temp (!) 96.8 F (36 C) (Oral)   Resp 19   LMP 06/09/1998   SpO2 94%   Physical Exam Vitals and nursing note reviewed.  Constitutional:      Appearance: She is well-developed. She is ill-appearing.  HENT:     Head: Normocephalic and atraumatic.     Mouth/Throat:   Eyes:     Conjunctiva/sclera: Conjunctivae normal.  Cardiovascular:     Rate and Rhythm: Regular rhythm. Tachycardia present.  Pulmonary:     Effort: Tachypnea and accessory muscle usage present.  Abdominal:     General: There is no distension.  Skin:    General: Skin is warm and dry.   Neurological:     Mental Status: She is alert and oriented to person, place, and time.     Cranial Nerves: No cranial nerve deficit.     ED Results / Procedures / Treatments   Labs (all labs ordered are listed, but only abnormal results are displayed) Labs Reviewed  COMPREHENSIVE METABOLIC PANEL - Abnormal; Notable for the following components:      Result Value   Sodium 130 (*)    Chloride 90 (*)    Glucose, Bld 105 (*)    Albumin 3.2 (*)    Alkaline Phosphatase 311 (*)    All other components within normal limits  CBC WITH DIFFERENTIAL/PLATELET - Abnormal; Notable for the following components:   Hemoglobin 11.9 (*)    RDW 16.1 (*)    Lymphs Abs 0.5 (*)    All other components within normal limits  BRAIN NATRIURETIC PEPTIDE  URINALYSIS, ROUTINE W REFLEX MICROSCOPIC    EKG EKG Interpretation  Date/Time:  Thursday Oct 16 2020 13:47:35 EDT Ventricular Rate:  111 PR Interval:  162 QRS Duration: 80 QT Interval:  321 QTC Calculation: 437 R Axis:   19 Text Interpretation: Sinus tachycardia Consider right atrial enlargement Anterior infarct, old Baseline wander Confirmed by Carmin Muskrat [phone]) on 10/16/2020 1:56:35 PM   Radiology CT Angio Chest PE W/Cm &/Or Wo Cm  Result Date: 10/16/2020 CLINICAL DATA:  Shortness of breath.  Stage IV lung cancer. EXAM: CT ANGIOGRAPHY CHEST WITH CONTRAST TECHNIQUE: Multidetector CT imaging of the chest was performed using the standard protocol during bolus administration of intravenous contrast. Multiplanar CT image reconstructions and MIPs were obtained to evaluate the vascular anatomy. CONTRAST:  17mL OMNIPAQUE IOHEXOL 350 MG/ML SOLN COMPARISON:  Six 6 09/09/2020. Portable chest obtained earlier today. FINDINGS: Cardiovascular: Satisfactory  opacification of the pulmonary arteries to the segmental level. No evidence of pulmonary embolism. Normal heart size. No pericardial effusion. Mediastinum/Nodes: No enlarged mediastinal, hilar, or  axillary lymph nodes. Thyroid gland, trachea, and esophagus demonstrate no significant findings. Lungs/Pleura: Moderate right pleural effusion with a mild increase in size. Stable moderate to large-sized left pleural effusion. A right pleural catheter remains in place. No pneumothorax. No significant change in multiple bilateral lung nodules and masses. The lung bases are not included in their entirety. Upper Abdomen: The minimal portion of the included liver is unremarkable. The remainder of the upper abdomen is not included. Musculoskeletal: Extensive sclerotic bone lesions have not changed significantly. Review of the MIP images confirms the above findings. IMPRESSION: 1. No pulmonary emboli. 2. Mild increase in size of a right pleural effusion and stable left pleural effusion. 3. No significant change in multiple bilateral lung masses and nodules. 4. Stable extensive sclerotic bone metastases. Electronically Signed   By: Claudie Revering M.D.   On: 10/16/2020 17:49   DG Chest Port 1 View  Result Date: 10/16/2020 CLINICAL DATA:  Shortness of breath.  Stage IV lung cancer. EXAM: PORTABLE CHEST 1 VIEW COMPARISON:  09/22/2020. FINDINGS: Normal sized heart with an interval decrease in size. No significant change in bilateral pleural effusions and bilateral lung nodules. Mildly increased bibasilar atelectasis. Stable thoracolumbar scoliosis. IMPRESSION: 1. No acute abnormality. 2. Mildly increased bibasilar atelectasis. 3. Stable bilateral pleural effusions and bilateral lung nodules compatible with pulmonary metastases. Electronically Signed   By: Claudie Revering M.D.   On: 10/16/2020 15:05    Procedures Procedures   Medications Ordered in ED Medications  fluconazole (DIFLUCAN) 40 MG/ML suspension 100 mg (100 mg Oral Given 10/16/20 1533)  ondansetron (ZOFRAN) injection 4 mg (4 mg Intravenous Given 10/16/20 1427)  iohexol (OMNIPAQUE) 350 MG/ML injection 100 mL (100 mLs Intravenous Contrast Given 10/16/20 1640)     ED Course  I have reviewed the triage vital signs and the nursing notes.  Pertinent labs & imaging results that were available during my care of the patient were reviewed by me and considered in my medical decision making (see chart for details).    6:29 PM Patient remains tachycardic, tachypneic, but oxygen levels are 94, 95%. With her sister present we discussed all findings including evidence for worsening confusion, metastatic burden. I have discussed her case with her oncologist, and oncology will follow as a consulting service. With consideration of pleural effusion, and visualization of the Pleurx catheter, possibly not in a location amenable to additional drainage, but with patient needing possible thoracentesis, she will be admitted for further monitoring, management.  No evidence for PE, ACS or other acute, complicating phenomena. MDM Rules/Calculators/A&P MDM Number of Diagnoses or Management Options Pleural effusion: established, worsening SOB (shortness of breath): established, worsening   Amount and/or Complexity of Data Reviewed Clinical lab tests: reviewed and ordered Tests in the radiology section of CPT: ordered and reviewed Tests in the medicine section of CPT: ordered and reviewed Decide to obtain previous medical records or to obtain history from someone other than the patient: yes Obtain history from someone other than the patient: yes Review and summarize past medical records: yes Discuss the patient with other providers: yes Independent visualization of images, tracings, or specimens: yes  Risk of Complications, Morbidity, and/or Mortality Presenting problems: high Diagnostic procedures: high Management options: high  Critical Care Total time providing critical care: < 30 minutes  Patient Progress Patient progress: stable   Final Clinical Impression(s) /  ED Diagnoses Final diagnoses:  SOB (shortness of breath)  Pleural effusion       Carmin Muskrat, MD 10/16/20 [phone]

## 2020-10-16 NOTE — ED Triage Notes (Addendum)
Pt BIB EMS from home. Pt hx stage 4 lung cancer and R pleural effusion with JP drain x2 months. Pt reports intermittent progressive SHOB 2-3 months. Pt denies any obvious problems with JP drain, but reports minimal drainage. Pt also reports thrush of the mouth and has not picked up prescription for diflucan yet.

## 2020-10-17 ENCOUNTER — Other Ambulatory Visit: Payer: Self-pay

## 2020-10-17 ENCOUNTER — Observation Stay (HOSPITAL_COMMUNITY): Payer: Medicare Other

## 2020-10-17 DIAGNOSIS — R918 Other nonspecific abnormal finding of lung field: Secondary | ICD-10-CM | POA: Diagnosis not present

## 2020-10-17 DIAGNOSIS — M8580 Other specified disorders of bone density and structure, unspecified site: Secondary | ICD-10-CM | POA: Diagnosis present

## 2020-10-17 DIAGNOSIS — C7951 Secondary malignant neoplasm of bone: Secondary | ICD-10-CM | POA: Diagnosis present

## 2020-10-17 DIAGNOSIS — Z8249 Family history of ischemic heart disease and other diseases of the circulatory system: Secondary | ICD-10-CM | POA: Diagnosis not present

## 2020-10-17 DIAGNOSIS — C3491 Malignant neoplasm of unspecified part of right bronchus or lung: Secondary | ICD-10-CM

## 2020-10-17 DIAGNOSIS — J910 Malignant pleural effusion: Secondary | ICD-10-CM | POA: Diagnosis present

## 2020-10-17 DIAGNOSIS — E871 Hypo-osmolality and hyponatremia: Principal | ICD-10-CM

## 2020-10-17 DIAGNOSIS — J90 Pleural effusion, not elsewhere classified: Secondary | ICD-10-CM | POA: Diagnosis not present

## 2020-10-17 DIAGNOSIS — E860 Dehydration: Secondary | ICD-10-CM | POA: Diagnosis present

## 2020-10-17 DIAGNOSIS — Z515 Encounter for palliative care: Secondary | ICD-10-CM | POA: Diagnosis not present

## 2020-10-17 DIAGNOSIS — E7800 Pure hypercholesterolemia, unspecified: Secondary | ICD-10-CM | POA: Diagnosis present

## 2020-10-17 DIAGNOSIS — M1990 Unspecified osteoarthritis, unspecified site: Secondary | ICD-10-CM | POA: Diagnosis present

## 2020-10-17 DIAGNOSIS — R52 Pain, unspecified: Secondary | ICD-10-CM | POA: Diagnosis not present

## 2020-10-17 DIAGNOSIS — B370 Candidal stomatitis: Secondary | ICD-10-CM | POA: Diagnosis present

## 2020-10-17 DIAGNOSIS — R0602 Shortness of breath: Secondary | ICD-10-CM | POA: Diagnosis not present

## 2020-10-17 DIAGNOSIS — Z803 Family history of malignant neoplasm of breast: Secondary | ICD-10-CM | POA: Diagnosis not present

## 2020-10-17 DIAGNOSIS — Z20822 Contact with and (suspected) exposure to covid-19: Secondary | ICD-10-CM | POA: Diagnosis present

## 2020-10-17 DIAGNOSIS — R5383 Other fatigue: Secondary | ICD-10-CM | POA: Diagnosis not present

## 2020-10-17 DIAGNOSIS — I10 Essential (primary) hypertension: Secondary | ICD-10-CM | POA: Diagnosis present

## 2020-10-17 DIAGNOSIS — Z66 Do not resuscitate: Secondary | ICD-10-CM | POA: Diagnosis present

## 2020-10-17 DIAGNOSIS — Z79899 Other long term (current) drug therapy: Secondary | ICD-10-CM | POA: Diagnosis not present

## 2020-10-17 DIAGNOSIS — Z7189 Other specified counseling: Secondary | ICD-10-CM | POA: Diagnosis not present

## 2020-10-17 LAB — CBC
HCT: 32.8 % — ABNORMAL LOW (ref 36.0–46.0)
Hemoglobin: 10.1 g/dL — ABNORMAL LOW (ref 12.0–15.0)
MCH: 26.6 pg (ref 26.0–34.0)
MCHC: 30.8 g/dL (ref 30.0–36.0)
MCV: 86.3 fL (ref 80.0–100.0)
Platelets: 271 10*3/uL (ref 150–400)
RBC: 3.8 MIL/uL — ABNORMAL LOW (ref 3.87–5.11)
RDW: 16 % — ABNORMAL HIGH (ref 11.5–15.5)
WBC: 5.5 10*3/uL (ref 4.0–10.5)
nRBC: 0 % (ref 0.0–0.2)

## 2020-10-17 LAB — BASIC METABOLIC PANEL
Anion gap: 4 — ABNORMAL LOW (ref 5–15)
BUN: 12 mg/dL (ref 8–23)
CO2: 33 mmol/L — ABNORMAL HIGH (ref 22–32)
Calcium: 8.6 mg/dL — ABNORMAL LOW (ref 8.9–10.3)
Chloride: 95 mmol/L — ABNORMAL LOW (ref 98–111)
Creatinine, Ser: 0.83 mg/dL (ref 0.44–1.00)
GFR, Estimated: >60 mL/min (ref >60)
Glucose, Bld: 98 mg/dL (ref 70–99)
Potassium: 4.9 mmol/L (ref 3.5–5.1)
Sodium: 132 mmol/L — ABNORMAL LOW (ref 135–145)

## 2020-10-17 LAB — SARS CORONAVIRUS 2 (TAT 6-24 HRS): SARS Coronavirus 2: NEGATIVE

## 2020-10-17 MED ORDER — SENNOSIDES-DOCUSATE SODIUM 8.6-50 MG PO TABS
1.0000 | ORAL_TABLET | Freq: Two times a day (BID) | ORAL | Status: DC
  Administered 2020-10-18 – 2020-10-20 (×3): 1 via ORAL
  Filled 2020-10-17 (×4): qty 1

## 2020-10-17 MED ORDER — NYSTATIN 100000 UNIT/ML MT SUSP
5.0000 mL | Freq: Four times a day (QID) | OROMUCOSAL | Status: DC
  Administered 2020-10-17 – 2020-10-20 (×11): 500000 [IU] via ORAL
  Filled 2020-10-17 (×10): qty 5

## 2020-10-17 MED ORDER — LIDOCAINE HCL 1 % IJ SOLN
INTRAMUSCULAR | Status: AC
  Filled 2020-10-17: qty 20

## 2020-10-17 MED ORDER — HYDROMORPHONE HCL 1 MG/ML IJ SOLN: 1.0000 mg | INTRAMUSCULAR | Status: DC | PRN

## 2020-10-17 MED ORDER — PROCHLORPERAZINE MALEATE 10 MG PO TABS
10.0000 mg | ORAL_TABLET | Freq: Four times a day (QID) | ORAL | Status: DC | PRN
  Filled 2020-10-17: qty 1

## 2020-10-17 MED ORDER — PROCHLORPERAZINE MALEATE 10 MG PO TABS: 10.0000 mg | ORAL_TABLET | Freq: Four times a day (QID) | ORAL | Status: DC | PRN

## 2020-10-17 MED ORDER — LACTATED RINGERS IV SOLN: INTRAVENOUS | Status: AC

## 2020-10-17 MED ORDER — ONDANSETRON 4 MG PO TBDP: 4.0000 mg | ORAL_TABLET | Freq: Three times a day (TID) | ORAL | Status: DC | PRN

## 2020-10-17 MED ORDER — TRAMADOL HCL 50 MG PO TABS
50.0000 mg | ORAL_TABLET | Freq: Four times a day (QID) | ORAL | Status: DC | PRN
  Administered 2020-10-18: 50 mg via ORAL
  Filled 2020-10-17 (×2): qty 1

## 2020-10-17 MED ORDER — PANTOPRAZOLE SODIUM 40 MG PO TBEC
40.0000 mg | DELAYED_RELEASE_TABLET | Freq: Every day | ORAL | Status: DC
  Filled 2020-10-17: qty 1

## 2020-10-17 MED ORDER — HYDROXYZINE HCL 25 MG PO TABS
50.0000 mg | ORAL_TABLET | Freq: Three times a day (TID) | ORAL | Status: DC | PRN
  Administered 2020-10-17: 50 mg via ORAL
  Filled 2020-10-17: qty 2

## 2020-10-17 MED ORDER — PROCHLORPERAZINE EDISYLATE 10 MG/2ML IJ SOLN
10.0000 mg | Freq: Four times a day (QID) | INTRAMUSCULAR | Status: DC | PRN
  Administered 2020-10-17 – 2020-10-18 (×3): 10 mg via INTRAVENOUS
  Filled 2020-10-17 (×3): qty 2

## 2020-10-17 MED ORDER — PROCHLORPERAZINE EDISYLATE 10 MG/2ML IJ SOLN: 10.0000 mg | Freq: Four times a day (QID) | INTRAMUSCULAR | Status: DC | PRN

## 2020-10-17 MED ORDER — LIP MEDEX EX OINT
TOPICAL_OINTMENT | CUTANEOUS | Status: DC | PRN
  Filled 2020-10-17: qty 7

## 2020-10-17 NOTE — Progress Notes (Signed)
HEMATOLOGY-ONCOLOGY PROGRESS NOTE  SUBJECTIVE: The patient just returned from interventional radiology just prior to my visit.  She had a thoracentesis performed on the left side and 800 cc of fluid was removed.  She reports pain to her left chest today.  Does not notice any significant improvement in her shortness of breath.  She is having vomiting at the time of visit.  She is resistant to taking pain medication and only wants to take Compazine for her nausea/vomiting.  Oncology History  Adenocarcinoma of right lung, stage 4 ([place])  08/19/2015 Initial Diagnosis   Adenocarcinoma of right lung, stage 4 ([place])   07/22/2020 -  Chemotherapy    Patient is on Treatment Plan: LUNG NIVOLUMAB Q28D        REVIEW OF SYSTEMS:   Constitutional: Denies fevers, chills Eyes: Denies blurriness of vision Ears, nose, mouth, throat, and face: Reports thrush. Respiratory: Reports shortness of breath Cardiovascular: Has discomfort in left side of her chest following thoracentesis Gastrointestinal: Reports nausea and vomiting Skin: Denies abnormal skin rashes Lymphatics: Denies new lymphadenopathy or easy bruising Neurological:Denies numbness, tingling or new weaknesses Behavioral/Psych: Mood is stable, no new changes  Extremities: No lower extremity edema All other systems were reviewed with the patient and are negative.  I have reviewed the past medical history, past surgical history, social history and family history with the patient and they are unchanged from previous note.   PHYSICAL EXAMINATION: ECOG PERFORMANCE STATUS: 2 - Symptomatic, <50% confined to bed  Vitals:   10/17/20 0114 10/17/20 0606  BP: 120/74 121/79  Pulse: 89 86  Resp: 15 16  Temp: 97.9 F (36.6 C) (!) 97.5 F (36.4 C)  SpO2: 100% 100%   There were no vitals filed for this visit.  Intake/Output from previous day: 05/12 0701 - 05/13 0700 In: 400 [I.V.:400] Out: 200 [Urine:200]  GENERAL: Chronically ill-appearing  female, appears uncomfortable SKIN: skin color, texture, turgor are normal, no rashes or significant lesions EYES: No scleral icterus OROPHARYNX: Oral thrush noted LUNGS: Diminished breath sounds bilateral bases HEART: regular rate & rhythm and no murmurs and no lower extremity edema ABDOMEN:abdomen soft, non-tender and normal bowel sounds  NEURO: alert & oriented x 3 with fluent speech, no focal motor/sensory deficits  LABORATORY DATA:  I have reviewed the data as listed CMP Latest Ref Rng & Units 10/17/2020 10/16/2020 09/23/2020  Glucose 70 - 99 mg/dL 98 105(H) 135(H)  BUN 8 - 23 mg/dL 12 12 13   Creatinine 0.44 - 1.00 mg/dL 0.83 0.77 0.79  Sodium 135 - 145 mmol/L 132(L) 130(L) 132(L)  Potassium 3.5 - 5.1 mmol/L 4.9 4.7 4.0  Chloride 98 - 111 mmol/L 95(L) 90(L) 94(L)  CO2 22 - 32 mmol/L 33(H) 31 30  Calcium 8.9 - 10.3 mg/dL 8.6(L) 9.3 9.1  Total Protein 6.5 - 8.1 g/dL - 7.6 6.6  Total Bilirubin 0.3 - 1.2 mg/dL - 0.9 0.6  Alkaline Phos 38 - 126 U/L - 311(H) 249(H)  AST 15 - 41 U/L - 31 23  ALT 0 - 44 U/L - 15 8    Lab Results  Component Value Date   WBC 5.5 10/17/2020   HGB 10.1 (L) 10/17/2020   HCT 32.8 (L) 10/17/2020   MCV 86.3 10/17/2020   PLT 271 10/17/2020   NEUTROABS 6.0 10/16/2020    DG Chest 1 View  Result Date: 09/22/2020 CLINICAL DATA:  Post thoracentesis on the left. EXAM: CHEST  1 VIEW COMPARISON:  Radiographs and CT 09/09/2020. FINDINGS: 1523 hours. The left  pleural effusion has mildly decreased in volume. The right pleural effusion appears unchanged. Right pleural drainage catheter and right IJ Port-A-Cath remain in place. The heart size and mediastinal contours are stable. Bibasilar atelectasis and multiple pulmonary nodules are again noted. No pneumothorax. IMPRESSION: Decreased left pleural effusion and no pneumothorax following thoracentesis. Known bilateral pulmonary metastases are again seen. Electronically Signed   By: Richardean Sale M.D.   On: 09/22/2020  15:33   CT Angio Chest PE W/Cm &/Or Wo Cm  Result Date: 10/16/2020 CLINICAL DATA:  Shortness of breath.  Stage IV lung cancer. EXAM: CT ANGIOGRAPHY CHEST WITH CONTRAST TECHNIQUE: Multidetector CT imaging of the chest was performed using the standard protocol during bolus administration of intravenous contrast. Multiplanar CT image reconstructions and MIPs were obtained to evaluate the vascular anatomy. CONTRAST:  133mL OMNIPAQUE IOHEXOL 350 MG/ML SOLN COMPARISON:  Six 6 09/09/2020. Portable chest obtained earlier today. FINDINGS: Cardiovascular: Satisfactory opacification of the pulmonary arteries to the segmental level. No evidence of pulmonary embolism. Normal heart size. No pericardial effusion. Mediastinum/Nodes: No enlarged mediastinal, hilar, or axillary lymph nodes. Thyroid gland, trachea, and esophagus demonstrate no significant findings. Lungs/Pleura: Moderate right pleural effusion with a mild increase in size. Stable moderate to large-sized left pleural effusion. A right pleural catheter remains in place. No pneumothorax. No significant change in multiple bilateral lung nodules and masses. The lung bases are not included in their entirety. Upper Abdomen: The minimal portion of the included liver is unremarkable. The remainder of the upper abdomen is not included. Musculoskeletal: Extensive sclerotic bone lesions have not changed significantly. Review of the MIP images confirms the above findings. IMPRESSION: 1. No pulmonary emboli. 2. Mild increase in size of a right pleural effusion and stable left pleural effusion. 3. No significant change in multiple bilateral lung masses and nodules. 4. Stable extensive sclerotic bone metastases. Electronically Signed   By: Claudie Revering M.D.   On: 10/16/2020 17:49   DG Chest Port 1 View  Result Date: 10/16/2020 CLINICAL DATA:  Shortness of breath.  Stage IV lung cancer. EXAM: PORTABLE CHEST 1 VIEW COMPARISON:  09/22/2020. FINDINGS: Normal sized heart with an  interval decrease in size. No significant change in bilateral pleural effusions and bilateral lung nodules. Mildly increased bibasilar atelectasis. Stable thoracolumbar scoliosis. IMPRESSION: 1. No acute abnormality. 2. Mildly increased bibasilar atelectasis. 3. Stable bilateral pleural effusions and bilateral lung nodules compatible with pulmonary metastases. Electronically Signed   By: Claudie Revering M.D.   On: 10/16/2020 15:05   US Thoracentesis Asp Pleural space w/IMG guide  Result Date: 09/22/2020 INDICATION: Recurrent pleural effusion due to stage IV lung cancer. Request for therapeutic thoracentesis. EXAM: ULTRASOUND GUIDED LEFT THORACENTESIS MEDICATIONS: 10 mL 1% lidocaine COMPLICATIONS: None immediate.  No pneumothorax on follow-up chest radiograph. PROCEDURE: An ultrasound guided thoracentesis was thoroughly discussed with the patient and questions answered. The benefits, risks, alternatives and complications were also discussed. The patient understands and wishes to proceed with the procedure. Written consent was obtained. Ultrasound was performed to localize and mark an adequate pocket of fluid in the left chest. The area was then prepped and draped in the normal sterile fashion. 1% Lidocaine was used for local anesthesia. Under ultrasound guidance a 6 Fr Safe-T-Centesis catheter was introduced. Thoracentesis was performed. The catheter was removed and a dressing applied. FINDINGS: A total of approximately 800 cc of clear, dark brown fluid was removed. Due to patient chest discomfort only the above amount of fluid was removed today. Post procedure chest X-ray  reviewed, negative for pneumothorax. IMPRESSION: Successful ultrasound guided left thoracentesis yielding 800 cc of pleural fluid. Read by: Durenda Guthrie, PA-C Electronically Signed   By: Lucrezia Europe M.D.   On: 09/22/2020 15:39    ASSESSMENT AND PLAN: The patient is a 75 year old Hispanic female with metastatic non-small cell lung cancer,  adenocarcinoma initially diagnosed in March 2017.  She is status post induction systemic chemotherapy with carboplatin and Alimta and then received maintenance Alimta and completed 73 cycles.  She overall tolerated this treatment well but started to have increasing fatigue and weakness as well as shortness of breath.  She also had evidence of disease progression.  She is now undergoing second line treatment with immunotherapy consisting of nivolumab 480 mg IV every 4 weeks.  She is status post 3 cycles of treatment.  She also receives Zometa every 12 weeks for bone metastases.  The patient is now admitted with shortness of breath and bilateral pleural effusions.  She is status post thoracentesis performed earlier today but has not had much improvement in her shortness of breath at this time.  He has a Pleurx catheter and place on the right.  She tells me that it is not draining very much.  CTA chest performed on admission indicates that the right Pleurx catheter remains in place, however, there is a moderate right pleural effusion which has increased in size.  Recommend discussing case with cardiothoracic surgery to place the Pleurx catheter to determine if it is indeed in place and what additional recommendations they have for the Pleurx.  If Pleurx is not draining, would consider a right-sided thoracentesis.  For nausea, I have ordered p.o. and IV Compazine every 6 hours as needed.  For pain, she has oxycodone ordered but is resistant to taking this.  I have also ordered Dilaudid 1 mg IV every 4 hours as needed for severe pain.  I have encouraged her to take pain medication if she develops any pain.  Continue fluconazole and nystatin for oral candidiasis.   LOS: 0 days   Mikey Bussing, DNP, AGPCNP-BC, AOCNP 10/17/20

## 2020-10-17 NOTE — Progress Notes (Signed)
Received report from [place] RN/ED Pt arrived to rm 1341 via stretcher and transferred to bed without difficulty and aligned in bed for comfort. Education initiated w/ pt on use of call bell for needs/safety and pt handbook at bedside.

## 2020-10-17 NOTE — Progress Notes (Signed)
Patient transfers from bed to [facility] with gait belt and RW. Educated pt's daughter of use of gait belt and showed her how to put it on and use.

## 2020-10-17 NOTE — Plan of Care (Signed)
POC initiated 

## 2020-10-17 NOTE — Progress Notes (Signed)
PleurX drain completed at 1315.   130mL of micropurlulent drainage    Cardiothoracic surgery verified since PleurX is draining, no further management in needed from their team, and Oncology can manage from their standpoint.   Communication coming from [place], [phone]    Will continue to monitor patient.    [place], Therapist, sports

## 2020-10-17 NOTE — Plan of Care (Signed)
  Problem: Pain Managment: Goal: General experience of comfort will improve Outcome: Progressing   Problem: Safety: Goal: Ability to remain free from injury will improve Outcome: Progressing   

## 2020-10-17 NOTE — Procedures (Signed)
PROCEDURE SUMMARY:  Successful US guided left thoracentesis. Yielded 800 mL of red fluid. Patient tolerated procedure well. No immediate complications. EBL = trace   Post procedure chest X-ray pending.  Laiklynn Raczynski S Talmadge Ganas PA-C 10/17/2020 9:50 AM

## 2020-10-17 NOTE — Progress Notes (Signed)
PROGRESS NOTE    Valerie Long  FBP:[id number] DOB: [birth year] DOA: 10/16/2020 PCP: Colon Branch, MD    Chief Complaint  Patient presents with  . Shortness of Breath    Brief Narrative:  Valerie Long is a 75 y.o. 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   Assessment & Plan:   Principal Problem:   Pleural effusion, bilateral Active Problems:   Adenocarcinoma of right lung, stage 4 (HCC)   Hyponatremia   Oral thrush   Malignant pleural effusion  1 symptomatic malignant bilateral pleural effusion -Patient presenting with worsening shortness of breath. -Patient status post right Pleurx catheter placement 08/08/2020 per CT surgery, Dr. Roxan Hockey. -CT angiogram chest done on admission negative for PE however shows mild increase in size of right pleural effusion, stable moderate to large size left pleural effusion. -Patient status post ultrasound-guided thoracentesis with 800 cc of fluid removed per IR 10/17/2020. -CT surgery consulted to evaluate right Pleurx catheter and for right Pleurx catheter management. -Oncology consulted and following.  2.  Oral thrush -Continue oral Diflucan. -Placed on nystatin swish and swallow.  3.  Hyponatremia/dehydration -Patient clinically dehydrated on examination secondary to poor oral intake likely secondary to oral thrush. -Improving with gentle hydration. -Continue gentle hydration with IV fluids.  Follow.  4.  Stage IV right lung adenocarcinoma with pulmonary and osseous mets -Currently on active treatment with nivolumab status post cycle 3 on 09/23/2020. -Oncology consulted and following.  5.  Nausea/emesis -Continue symptomatic treatment with IV antiemetics as  ordered per oncology.    DVT prophylaxis: scd Code Status: DNR (discussed with patient DNI/partial code versus DNR and patient is in agreement to switch back to DNR.) Family Communication: Updated patient and sister at bedside. Disposition:   Status is: Inpatient    Dispo: The patient is from: Home              Anticipated d/c is to: Home               Patient currently being evaluated for worsening shortness of breath, bilateral pleural effusions, not stable for discharge.   Difficult to place patient: No       Consultants:   CT surgery pending  Oncology  Procedures:   Ultrasound-guided thoracentesis-800 cc removed per IR, Dr. Kathlene Cote 10/17/2020  CT angiogram chest 10/16/2020  Chest x-ray 10/17/2020, 10/16/2020  Antimicrobials:   None   Subjective: Patient sitting up in bed.  Still with complaints of shortness of breath.  Just underwent thoracentesis with 800 cc removed per IR.  Denies any chest pain.  No abdominal pain.  Patient noted to have some nausea and emesis this morning improved with IV Compazine.  Sister at bedside.  Objective: Vitals:   10/17/20 0606 10/17/20 0935 10/17/20 0955 10/17/20 1036  BP: 121/79 (!) 151/84 (!) 176/97 (!) 143/78  Pulse: 86   (!) 105  Resp: 16   19  Temp: (!) 97.5 F (36.4 C)   97.8 F (36.6 C)  TempSrc:    Oral  SpO2: 100%   100%    Intake/Output Summary (Last 24 hours) at 10/17/2020 1314 Last data filed at 10/17/2020 0840 Gross per 24 hour  Intake 400 ml  Output 600 ml  Net -200 ml   There were no vitals filed for this  visit.  Examination:  General exam: Frail.  Oral thrush noted. Respiratory system: Decreased breath sounds in the bases.  Some scattered crackles.  No wheezing.  Normal respiratory effort.   Cardiovascular system: S1 & S2 heard, RRR. No JVD, murmurs, rubs, gallops or clicks. No pedal edema. Gastrointestinal system: Abdomen is nondistended, soft and nontender. No organomegaly or masses felt. Normal  bowel sounds heard. Central nervous system: Alert and oriented. No focal neurological deficits. Extremities: Symmetric 5 x 5 power. Skin: No rashes, lesions or ulcers Psychiatry: Judgement and insight appear normal. Mood & affect appropriate.     Data Reviewed: I have personally reviewed following labs and imaging studies  CBC: Recent Labs  Lab 10/16/20 1352 10/17/20 0333  WBC 7.1 5.5  NEUTROABS 6.0  --   HGB 11.9* 10.1*  HCT 38.0 32.8*  MCV 84.8 86.3  PLT 335 124    Basic Metabolic Panel: Recent Labs  Lab 10/16/20 1352 10/17/20 0333  NA 130* 132*  K 4.7 4.9  CL 90* 95*  CO2 31 33*  GLUCOSE 105* 98  BUN 12 12  CREATININE 0.77 0.83  CALCIUM 9.3 8.6*    GFR: CrCl cannot be calculated (Unknown ideal weight.).  Liver Function Tests: Recent Labs  Lab 10/16/20 1352  AST 31  ALT 15  ALKPHOS 311*  BILITOT 0.9  PROT 7.6  ALBUMIN 3.2*    CBG: No results for input(s): GLUCAP in the last 168 hours.   Recent Results (from the past 240 hour(s))  SARS CORONAVIRUS 2 (TAT 6-24 HRS) Nasopharyngeal Nasopharyngeal Swab     Status: None   Collection Time: 10/16/20  7:25 PM   Specimen: Nasopharyngeal Swab  Result Value Ref Range Status   SARS Coronavirus 2 NEGATIVE NEGATIVE Final    Comment: (NOTE) SARS-CoV-2 target nucleic acids are NOT DETECTED.  The SARS-CoV-2 RNA is generally detectable in upper and lower respiratory specimens during the acute phase of infection. Negative results do not preclude SARS-CoV-2 infection, do not rule out co-infections with other pathogens, and should not be used as the sole basis for treatment or other patient management decisions. Negative results must be combined with clinical observations, patient history, and epidemiological information. The expected result is Negative.  Fact Sheet for Patients: SugarRoll.be  Fact Sheet for Healthcare Providers: https://www.woods-mathews.com/  This  test is not yet approved or cleared by the [place] FDA and  has been authorized for detection and/or diagnosis of SARS-CoV-2 by FDA under an Emergency Use Authorization (EUA). This EUA will remain  in effect (meaning this test can be used) for the duration of the COVID-19 declaration under Se ction 564(b)(1) of the Act, 21 U.S.C. section 360bbb-3(b)(1), unless the authorization is terminated or revoked sooner.  Performed at [facility], [place] [address] [place], [place] [address]          Radiology Studies: DG Chest 1 View  Result Date: 10/17/2020 CLINICAL DATA:  Post left thoracentesis EXAM: CHEST  1 VIEW COMPARISON:  10/16/2020 FINDINGS: Right Port-A-Cath and right PleurX catheter remain in place, unchanged. Decreasing left pleural effusion following thoracentesis. No pneumothorax. Small right effusion is stable. Heart is mildly enlarged. Bilateral lower lobe airspace opacities, improving on the left following thoracentesis. IMPRESSION: Decreasing left pleural effusion following thoracentesis. No pneumothorax. Bilateral lower lobe airspace opacities, improving on the left since prior study. Electronically Signed   By: Rolm Baptise M.D.   On: 10/17/2020 10:20   CT Angio Chest PE W/Cm &/Or Wo Cm  Result  Date: 10/16/2020 CLINICAL DATA:  Shortness of breath.  Stage IV lung cancer. EXAM: CT ANGIOGRAPHY CHEST WITH CONTRAST TECHNIQUE: Multidetector CT imaging of the chest was performed using the standard protocol during bolus administration of intravenous contrast. Multiplanar CT image reconstructions and MIPs were obtained to evaluate the vascular anatomy. CONTRAST:  152mL OMNIPAQUE IOHEXOL 350 MG/ML SOLN COMPARISON:  Six 6 09/09/2020. Portable chest obtained earlier today. FINDINGS: Cardiovascular: Satisfactory opacification of the pulmonary arteries to the segmental level. No evidence of pulmonary embolism. Normal heart size. No pericardial effusion. Mediastinum/Nodes: No enlarged  mediastinal, hilar, or axillary lymph nodes. Thyroid gland, trachea, and esophagus demonstrate no significant findings. Lungs/Pleura: Moderate right pleural effusion with a mild increase in size. Stable moderate to large-sized left pleural effusion. A right pleural catheter remains in place. No pneumothorax. No significant change in multiple bilateral lung nodules and masses. The lung bases are not included in their entirety. Upper Abdomen: The minimal portion of the included liver is unremarkable. The remainder of the upper abdomen is not included. Musculoskeletal: Extensive sclerotic bone lesions have not changed significantly. Review of the MIP images confirms the above findings. IMPRESSION: 1. No pulmonary emboli. 2. Mild increase in size of a right pleural effusion and stable left pleural effusion. 3. No significant change in multiple bilateral lung masses and nodules. 4. Stable extensive sclerotic bone metastases. Electronically Signed   By: Claudie Revering M.D.   On: 10/16/2020 17:49   DG Chest Port 1 View  Result Date: 10/16/2020 CLINICAL DATA:  Shortness of breath.  Stage IV lung cancer. EXAM: PORTABLE CHEST 1 VIEW COMPARISON:  09/22/2020. FINDINGS: Normal sized heart with an interval decrease in size. No significant change in bilateral pleural effusions and bilateral lung nodules. Mildly increased bibasilar atelectasis. Stable thoracolumbar scoliosis. IMPRESSION: 1. No acute abnormality. 2. Mildly increased bibasilar atelectasis. 3. Stable bilateral pleural effusions and bilateral lung nodules compatible with pulmonary metastases. Electronically Signed   By: Claudie Revering M.D.   On: 10/16/2020 15:05   US THORACENTESIS ASP PLEURAL SPACE W/IMG GUIDE  Result Date: 10/17/2020 INDICATION: Stage IV right lung adenocarcinoma with pulmonary and osseous metastases and malignant bilateral pleural effusions. Request for therapeutic thoracentesis. EXAM: ULTRASOUND GUIDED LEFT THORACENTESIS MEDICATIONS: 1%  lidocaine 8 mL COMPLICATIONS: None immediate. PROCEDURE: An ultrasound guided thoracentesis was thoroughly discussed with the patient and questions answered. The benefits, risks, alternatives and complications were also discussed. The patient understands and wishes to proceed with the procedure. Written consent was obtained. Ultrasound was performed to localize and mark an adequate pocket of fluid in the left chest. The area was then prepped and draped in the normal sterile fashion. 1% Lidocaine was used for local anesthesia. Under ultrasound guidance a 6 Fr Safe-T-Centesis catheter was introduced. Thoracentesis was performed. The catheter was removed and a dressing applied. FINDINGS: A total of approximately 800 mL of red fluid was removed. Limited ultrasound of the right chest showed that the right effusion is loculated. IMPRESSION: Successful ultrasound guided left thoracentesis yielding 800 mL of pleural fluid. No pneumothorax on post-procedure chest x-ray. Read by: Gareth Eagle, PA-C Electronically Signed   By: Aletta Edouard M.D.   On: 10/17/2020 10:28        Scheduled Meds: . fluconazole  200 mg Oral Daily  . lidocaine      . nystatin  5 mL Oral QID  . pantoprazole  40 mg Oral Q0600  . polyvinyl alcohol  1 drop Both Eyes BID  . senna-docusate  1 tablet Oral BID  Continuous Infusions:   LOS: 0 days    Time spent: 35 minutes    Irine Seal, MD [facility]   To contact the attending provider between 7A-7P or the covering provider during after hours 7P-7A, please log into the web site [email or web address] and access using universal [place] password for that web site. If you do not have the password, please call the hospital operator.  10/17/2020, 1:14 PM

## 2020-10-18 LAB — CBC WITH DIFFERENTIAL/PLATELET
Abs Immature Granulocytes: 0.04 10*3/uL (ref 0.00–0.07)
Basophils Absolute: 0 10*3/uL (ref 0.0–0.1)
Basophils Relative: 1 %
Eosinophils Absolute: 0.1 10*3/uL (ref 0.0–0.5)
Eosinophils Relative: 1 %
HCT: 35.5 % — ABNORMAL LOW (ref 36.0–46.0)
Hemoglobin: 10.7 g/dL — ABNORMAL LOW (ref 12.0–15.0)
Immature Granulocytes: 1 %
Lymphocytes Relative: 13 %
Lymphs Abs: 0.9 10*3/uL (ref 0.7–4.0)
MCH: 26.8 pg (ref 26.0–34.0)
MCHC: 30.1 g/dL (ref 30.0–36.0)
MCV: 89 fL (ref 80.0–100.0)
Monocytes Absolute: 1 10*3/uL (ref 0.1–1.0)
Monocytes Relative: 13 %
Neutro Abs: 5.3 10*3/uL (ref 1.7–7.7)
Neutrophils Relative %: 71 %
Platelets: 317 10*3/uL (ref 150–400)
RBC: 3.99 MIL/uL (ref 3.87–5.11)
RDW: 15.9 % — ABNORMAL HIGH (ref 11.5–15.5)
WBC: 7.4 10*3/uL (ref 4.0–10.5)
nRBC: 0 % (ref 0.0–0.2)

## 2020-10-18 LAB — BASIC METABOLIC PANEL
Anion gap: 9 (ref 5–15)
BUN: 17 mg/dL (ref 8–23)
CO2: 31 mmol/L (ref 22–32)
Calcium: 9.1 mg/dL (ref 8.9–10.3)
Chloride: 96 mmol/L — ABNORMAL LOW (ref 98–111)
Creatinine, Ser: 0.75 mg/dL (ref 0.44–1.00)
GFR, Estimated: >60 mL/min (ref >60)
Glucose, Bld: 87 mg/dL (ref 70–99)
Potassium: 5.1 mmol/L (ref 3.5–5.1)
Sodium: 136 mmol/L (ref 135–145)

## 2020-10-18 MED ORDER — MIRTAZAPINE 15 MG PO TBDP
15.0000 mg | ORAL_TABLET | Freq: Every day | ORAL | Status: DC
  Administered 2020-10-18: 15 mg via ORAL
  Filled 2020-10-18 (×2): qty 1

## 2020-10-18 MED ORDER — CHLORHEXIDINE GLUCONATE CLOTH 2 % EX PADS
6.0000 | MEDICATED_PAD | Freq: Every day | CUTANEOUS | Status: DC
  Administered 2020-10-18 – 2020-10-20 (×3): 6 via TOPICAL

## 2020-10-18 NOTE — Progress Notes (Signed)
Pt remains stable though activity intolerance remains. Pt gets short of breath and weak with very limited mobility in room with walker support. Her 02 sat with ambulation was 94 percent though her hr and respiratory rate did increase with minimal activity. She states she feels like she is suffocating to breath without the 02 on. Rn will continue to monitor.

## 2020-10-18 NOTE — Plan of Care (Signed)
  Problem: Clinical Measurements: Goal: Will remain free from infection Outcome: Progressing   Problem: Clinical Measurements: Goal: Diagnostic test results will improve Outcome: Progressing   Problem: Clinical Measurements: Goal: Respiratory complications will improve Outcome: Progressing   Problem: Clinical Measurements: Goal: Cardiovascular complication will be avoided Outcome: Progressing   Problem: Coping: Goal: Level of anxiety will decrease Outcome: Progressing

## 2020-10-18 NOTE — Clinical Social Work Note (Signed)
Patient who does not qualify for O2 via SAT note could benefit from home O2 with dx of Stage 4 lung cancer and Bilateral Pleural effusion.  Called [address] with [employer] working today.  Lane Hacker with [employer] [facility].  Not working.  Stephania Fragmin with [employer] who was not able to answer question definitively. If patient is still wanting O2 at D/C, I would advise having [employer] talk to her about out of pocket expense to see if it is affordable for her. TOC will continue to follow during the course of hospitalization.

## 2020-10-18 NOTE — Progress Notes (Signed)
PROGRESS NOTE    Valerie Long  BCW:[id number] DOB: [birth year] DOA: 10/16/2020 PCP: Colon Branch, MD    Chief Complaint  Patient presents with  . Shortness of Breath    Brief Narrative:  Valerie Long is a 75 y.o. 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   Assessment & Plan:   Principal Problem:   Pleural effusion, bilateral Active Problems:   Adenocarcinoma of right lung, stage 4 (HCC)   Hyponatremia   Oral thrush   Malignant pleural effusion  1 symptomatic malignant bilateral pleural effusion -Patient presenting with worsening shortness of breath. -Patient status post right Pleurx catheter placement 08/08/2020 per CT surgery, Dr. Roxan Hockey. -CT angiogram chest done on admission negative for PE however shows mild increase in size of right pleural effusion, stable moderate to large size left pleural effusion. -Patient status post ultrasound-guided thoracentesis with 800 cc of fluid removed per IR 10/17/2020. -CT surgery consulted to evaluate right Pleurx catheter and for right Pleurx catheter management. -CT surgery discussed with RN on 10/17/2020 and Pleurx drained 170 cc of mucopurulent drainage and per RN's note it was verified per CT surgery that as Pleurx was draining, no further management needed from their perspective. -Check ambulatory sats as patient requesting hopefully to be discharged home on O2. -Oncology consulted and following.  2.  Oral thrush -Continue current regimen of nystatin swish and swallow and oral Diflucan.  3.  Hyponatremia/dehydration -Patient clinically dehydrated on examination secondary to poor oral intake likely secondary to oral thrush. -Improved with gentle hydration.    -Supportive care.   4.  Stage IV right lung adenocarcinoma with pulmonary and osseous mets -Currently on active treatment with nivolumab status post cycle 3 on 09/23/2020. -Oncology consulted and following.  5.  Nausea/emesis -Improving with symptomatic treatment with IV antiemetics.  Follow.    DVT prophylaxis: scd Code Status: DNR (discussed with patient DNI/partial code versus DNR and patient is in agreement to switch back to DNR.) Family Communication: Updated patient and sister at bedside. Disposition:   Status is: Inpatient    Dispo: The patient is from: Home              Anticipated d/c is to: Home               Patient currently being evaluated for worsening shortness of breath, bilateral pleural effusions, not stable for discharge.   Difficult to place patient: No       Consultants:   CT surgery pending  Oncology  Procedures:   Ultrasound-guided thoracentesis-800 cc removed per IR, Dr. Kathlene Cote 10/17/2020  CT angiogram chest 10/16/2020  Chest x-ray 10/17/2020, 10/16/2020  Antimicrobials:   None   Subjective: Patient sitting up in bed.  Feels a little bit better than she did yesterday.  Still with shortness of breath on exertion.  States some improvement with shortness of breath at rest.  Stated ate a little bit better for breakfast today.  Denies any emesis.  Improvement with nausea.  Feels IV Dilaudid pain medication too strong and feels pain is currently controlled on current regimen of Tylenol and Ultram.  Sister at bedside.  Patient wondering whether going home with oxygen may be beneficial.  Objective: Vitals:   10/17/20 1501 10/17/20 2048 10/18/20 0625 10/18/20 1009  BP: (!) 147/92 122/69 Marland Kitchen)  141/78   Pulse: (!) 104 (!) 110 (!) 102 100  Resp: 20 20 16 18   Temp: (!) 97.4 F (36.3 C) (!) 97.4 F (36.3 C) 98.3 F (36.8 C)   TempSrc:      SpO2: 100% 100% 100% 94%    Intake/Output Summary (Last 24 hours) at 10/18/2020 1303 Last data filed at  10/18/2020 1126 Gross per 24 hour  Intake 1840.8 ml  Output 200 ml  Net 1640.8 ml   There were no vitals filed for this visit.  Examination:  General exam: Frail.  Oral thrush noted. Respiratory system: Decreased breath sounds in the bases.  Fair air movement.  Some scattered crackles.  No wheezing.  Normal respiratory effort. Cardiovascular system: Regular rate rhythm no murmurs rubs or gallops.  No JVD.  No lower extremity edema. Gastrointestinal system: Abdomen is soft, nontender, nondistended, positive bowel sounds.  No rebound.  No guarding.  Central nervous system: Alert and oriented. No focal neurological deficits. Extremities: Symmetric 5 x 5 power. Skin: No rashes, lesions or ulcers Psychiatry: Judgement and insight appear normal. Mood & affect appropriate.     Data Reviewed: I have personally reviewed following labs and imaging studies  CBC: Recent Labs  Lab 10/16/20 1352 10/17/20 0333 10/18/20 0321  WBC 7.1 5.5 7.4  NEUTROABS 6.0  --  5.3  HGB 11.9* 10.1* 10.7*  HCT 38.0 32.8* 35.5*  MCV 84.8 86.3 89.0  PLT 335 271 539    Basic Metabolic Panel: Recent Labs  Lab 10/16/20 1352 10/17/20 0333 10/18/20 0321  NA 130* 132* 136  K 4.7 4.9 5.1  CL 90* 95* 96*  CO2 31 33* 31  GLUCOSE 105* 98 87  BUN 12 12 17   CREATININE 0.77 0.83 0.75  CALCIUM 9.3 8.6* 9.1    GFR: CrCl cannot be calculated (Unknown ideal weight.).  Liver Function Tests: Recent Labs  Lab 10/16/20 1352  AST 31  ALT 15  ALKPHOS 311*  BILITOT 0.9  PROT 7.6  ALBUMIN 3.2*    CBG: No results for input(s): GLUCAP in the last 168 hours.   Recent Results (from the past 240 hour(s))  SARS CORONAVIRUS 2 (TAT 6-24 HRS) Nasopharyngeal Nasopharyngeal Swab     Status: None   Collection Time: 10/16/20  7:25 PM   Specimen: Nasopharyngeal Swab  Result Value Ref Range Status   SARS Coronavirus 2 NEGATIVE NEGATIVE Final    Comment: (NOTE) SARS-CoV-2 target nucleic acids are NOT  DETECTED.  The SARS-CoV-2 RNA is generally detectable in upper and lower respiratory specimens during the acute phase of infection. Negative results do not preclude SARS-CoV-2 infection, do not rule out co-infections with other pathogens, and should not be used as the sole basis for treatment or other patient management decisions. Negative results must be combined with clinical observations, patient history, and epidemiological information. The expected result is Negative.  Fact Sheet for Patients: SugarRoll.be  Fact Sheet for Healthcare Providers: https://www.woods-mathews.com/  This test is not yet approved or cleared by the [place] FDA and  has been authorized for detection and/or diagnosis of SARS-CoV-2 by FDA under an Emergency Use Authorization (EUA). This EUA will remain  in effect (meaning this test can be used) for the duration of the COVID-19 declaration under Se ction 564(b)(1) of the Act, 21 U.S.C. section 360bbb-3(b)(1), unless the authorization is terminated or revoked sooner.  Performed at [facility], [place] [address], [place], [place] [address]          Radiology  Studies: DG Chest 1 View  Result Date: 10/17/2020 CLINICAL DATA:  Post left thoracentesis EXAM: CHEST  1 VIEW COMPARISON:  10/16/2020 FINDINGS: Right Port-A-Cath and right PleurX catheter remain in place, unchanged. Decreasing left pleural effusion following thoracentesis. No pneumothorax. Small right effusion is stable. Heart is mildly enlarged. Bilateral lower lobe airspace opacities, improving on the left following thoracentesis. IMPRESSION: Decreasing left pleural effusion following thoracentesis. No pneumothorax. Bilateral lower lobe airspace opacities, improving on the left since prior study. Electronically Signed   By: Rolm Baptise M.D.   On: 10/17/2020 10:20   CT Angio Chest PE W/Cm &/Or Wo Cm  Result Date: 10/16/2020 CLINICAL DATA:   Shortness of breath.  Stage IV lung cancer. EXAM: CT ANGIOGRAPHY CHEST WITH CONTRAST TECHNIQUE: Multidetector CT imaging of the chest was performed using the standard protocol during bolus administration of intravenous contrast. Multiplanar CT image reconstructions and MIPs were obtained to evaluate the vascular anatomy. CONTRAST:  123mL OMNIPAQUE IOHEXOL 350 MG/ML SOLN COMPARISON:  Six 6 09/09/2020. Portable chest obtained earlier today. FINDINGS: Cardiovascular: Satisfactory opacification of the pulmonary arteries to the segmental level. No evidence of pulmonary embolism. Normal heart size. No pericardial effusion. Mediastinum/Nodes: No enlarged mediastinal, hilar, or axillary lymph nodes. Thyroid gland, trachea, and esophagus demonstrate no significant findings. Lungs/Pleura: Moderate right pleural effusion with a mild increase in size. Stable moderate to large-sized left pleural effusion. A right pleural catheter remains in place. No pneumothorax. No significant change in multiple bilateral lung nodules and masses. The lung bases are not included in their entirety. Upper Abdomen: The minimal portion of the included liver is unremarkable. The remainder of the upper abdomen is not included. Musculoskeletal: Extensive sclerotic bone lesions have not changed significantly. Review of the MIP images confirms the above findings. IMPRESSION: 1. No pulmonary emboli. 2. Mild increase in size of a right pleural effusion and stable left pleural effusion. 3. No significant change in multiple bilateral lung masses and nodules. 4. Stable extensive sclerotic bone metastases. Electronically Signed   By: Claudie Revering M.D.   On: 10/16/2020 17:49   DG Chest Port 1 View  Result Date: 10/16/2020 CLINICAL DATA:  Shortness of breath.  Stage IV lung cancer. EXAM: PORTABLE CHEST 1 VIEW COMPARISON:  09/22/2020. FINDINGS: Normal sized heart with an interval decrease in size. No significant change in bilateral pleural effusions and  bilateral lung nodules. Mildly increased bibasilar atelectasis. Stable thoracolumbar scoliosis. IMPRESSION: 1. No acute abnormality. 2. Mildly increased bibasilar atelectasis. 3. Stable bilateral pleural effusions and bilateral lung nodules compatible with pulmonary metastases. Electronically Signed   By: Claudie Revering M.D.   On: 10/16/2020 15:05   US THORACENTESIS ASP PLEURAL SPACE W/IMG GUIDE  Result Date: 10/17/2020 INDICATION: Stage IV right lung adenocarcinoma with pulmonary and osseous metastases and malignant bilateral pleural effusions. Request for therapeutic thoracentesis. EXAM: ULTRASOUND GUIDED LEFT THORACENTESIS MEDICATIONS: 1% lidocaine 8 mL COMPLICATIONS: None immediate. PROCEDURE: An ultrasound guided thoracentesis was thoroughly discussed with the patient and questions answered. The benefits, risks, alternatives and complications were also discussed. The patient understands and wishes to proceed with the procedure. Written consent was obtained. Ultrasound was performed to localize and mark an adequate pocket of fluid in the left chest. The area was then prepped and draped in the normal sterile fashion. 1% Lidocaine was used for local anesthesia. Under ultrasound guidance a 6 Fr Safe-T-Centesis catheter was introduced. Thoracentesis was performed. The catheter was removed and a dressing applied. FINDINGS: A total of approximately 800 mL of red fluid  was removed. Limited ultrasound of the right chest showed that the right effusion is loculated. IMPRESSION: Successful ultrasound guided left thoracentesis yielding 800 mL of pleural fluid. No pneumothorax on post-procedure chest x-ray. Read by: Gareth Eagle, PA-C Electronically Signed   By: Aletta Edouard M.D.   On: 10/17/2020 10:28        Scheduled Meds: . Chlorhexidine Gluconate Cloth  6 each Topical Daily  . fluconazole  200 mg Oral Daily  . nystatin  5 mL Oral QID  . pantoprazole  40 mg Oral Q0600  . polyvinyl alcohol  1 drop Both Eyes  BID  . senna-docusate  1 tablet Oral BID   Continuous Infusions: . lactated ringers Stopped (10/18/20 0837)     LOS: 1 day    Time spent: 35 minutes    Irine Seal, MD [facility]   To contact the attending provider between 7A-7P or the covering provider during after hours 7P-7A, please log into the web site [email or web address] and access using universal [place] password for that web site. If you do not have the password, please call the hospital operator.  10/18/2020, 1:03 PM

## 2020-10-19 DIAGNOSIS — Z515 Encounter for palliative care: Secondary | ICD-10-CM

## 2020-10-19 DIAGNOSIS — R52 Pain, unspecified: Secondary | ICD-10-CM

## 2020-10-19 DIAGNOSIS — R5383 Other fatigue: Secondary | ICD-10-CM

## 2020-10-19 LAB — BASIC METABOLIC PANEL
Anion gap: 4 — ABNORMAL LOW (ref 5–15)
BUN: 10 mg/dL (ref 8–23)
CO2: 33 mmol/L — ABNORMAL HIGH (ref 22–32)
Calcium: 8.7 mg/dL — ABNORMAL LOW (ref 8.9–10.3)
Chloride: 95 mmol/L — ABNORMAL LOW (ref 98–111)
Creatinine, Ser: 0.56 mg/dL (ref 0.44–1.00)
GFR, Estimated: >60 mL/min (ref >60)
Glucose, Bld: 103 mg/dL — ABNORMAL HIGH (ref 70–99)
Potassium: 4.6 mmol/L (ref 3.5–5.1)
Sodium: 132 mmol/L — ABNORMAL LOW (ref 135–145)

## 2020-10-19 LAB — CBC
HCT: 34 % — ABNORMAL LOW (ref 36.0–46.0)
Hemoglobin: 10.4 g/dL — ABNORMAL LOW (ref 12.0–15.0)
MCH: 27 pg (ref 26.0–34.0)
MCHC: 30.6 g/dL (ref 30.0–36.0)
MCV: 88.3 fL (ref 80.0–100.0)
Platelets: 287 10*3/uL (ref 150–400)
RBC: 3.85 MIL/uL — ABNORMAL LOW (ref 3.87–5.11)
RDW: 15.8 % — ABNORMAL HIGH (ref 11.5–15.5)
WBC: 7.6 10*3/uL (ref 4.0–10.5)
nRBC: 0 % (ref 0.0–0.2)

## 2020-10-19 MED ORDER — MIRTAZAPINE 30 MG PO TBDP
30.0000 mg | ORAL_TABLET | Freq: Every day | ORAL | Status: DC
  Administered 2020-10-19: 30 mg via ORAL
  Filled 2020-10-19: qty 1

## 2020-10-19 MED ORDER — SODIUM CHLORIDE 0.9% FLUSH
10.0000 mL | INTRAVENOUS | Status: DC | PRN
Start: 2020-10-19 — End: 2020-10-20

## 2020-10-19 MED ORDER — SODIUM CHLORIDE 0.9% FLUSH: 10.0000 mL | Freq: Two times a day (BID) | INTRAVENOUS | Status: DC

## 2020-10-19 NOTE — Evaluation (Addendum)
Physical Therapy Evaluation Patient Details Name: Valerie Long MRN: [id number] DOB: [birth year] Today's Date: 10/19/2020   History of Present Illness  75 y.o. 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  Clinical Impression  Pt admitted with above diagnosis.  Pt  Agreeable to mobility, amb 20' x2, SpO2=94-100% on RA. 3/4 DOE. Reviewed proper breathing techniques, need for frequent rests/assists with high demand tasks. Pt has very supportive family. To have  PCC--will follow for goals of care and needs. Update PT POC accordingly  Pt currently with functional limitations due to the deficits listed below (see PT Problem List). Pt will benefit from skilled PT to increase their independence and safety with mobility to allow discharge to the venue listed below.       Follow Up Recommendations Home health PT;No PT follow up (pending GOC)    Equipment Recommendations  None recommended by PT    Recommendations for Other Services       Precautions / Restrictions Precautions Precautions: Fall Precaution Comments: monitor O2 Restrictions Weight Bearing Restrictions: No      Mobility  Bed Mobility               General bed mobility comments: pt in recliner    Transfers Overall transfer level: Needs assistance Equipment used: Rolling walker (2 wheeled) Transfers: Sit to/from Stand Sit to Stand: Min guard         General transfer comment: cues for hand placement  Ambulation/Gait Ambulation/Gait assistance: Min assist;Min guard Gait Distance (Feet): 20 Feet (x2) Assistive device: Rolling walker (2 wheeled) Gait Pattern/deviations: Step-through pattern;Decreased stride length     General Gait Details:  cues for posture, breathing, self monitoring fatigue and dyspnea level; SpO2 = 94-100% on RA  Stairs            Wheelchair Mobility    Modified Rankin (Stroke Patients Only)       Balance Overall balance assessment: Needs assistance Sitting-balance support: Feet supported;No upper extremity supported Sitting balance-Leahy Scale: Good       Standing balance-Leahy Scale: Fair Standing balance comment: reliant on UEs for balance                             Pertinent Vitals/Pain Pain Assessment: No/denies pain    Home Living Family/patient expects to be discharged to:: Private residence Living Arrangements: Other relatives (lives with 71 yo mother) Available Help at Discharge: Family Type of Home: House       Home Layout: Two level;Able to live on main level with bedroom/bathroom Home Equipment: Walker - 4 wheels;Bedside commode Additional Comments: sisters rotate staying with pt and there mother assisting prn    Prior Function Level of Independence: Independent with assistive device(s);Needs assistance   Gait / Transfers Assistance Needed: amb with rollator, very short distances  ADL's / Homemaking Assistance Needed: assist from sisters        Hand Dominance        Extremity/Trunk Assessment   Upper Extremity Assessment Upper Extremity Assessment: Defer to OT evaluation    Lower Extremity Assessment Lower Extremity Assessment: Overall WFL for tasks assessed (rapid muscle fatigue)       Communication      Cognition Arousal/Alertness: Awake/alert Behavior During Therapy: WFL for tasks assessed/performed  Overall Cognitive Status: Within Functional Limits for tasks assessed                                        General Comments      Exercises     Assessment/Plan    PT Assessment Patient needs continued PT services  PT Problem List Decreased activity tolerance;Decreased mobility;Decreased knowledge of use of  DME;Decreased balance       PT Treatment Interventions DME instruction;Therapeutic activities;Gait training;Functional mobility training;Therapeutic exercise;Patient/family education;Balance training    PT Goals (Current goals can be found in the Care Plan section)  Acute Rehab PT Goals Patient Stated Goal: home PT Goal Formulation: With patient/family Time For Goal Achievement: 11/02/20 Potential to Achieve Goals: Fair    Frequency Min 3X/week   Barriers to discharge        Co-evaluation               AM-PAC PT "6 Clicks" Mobility  Outcome Measure Help needed turning from your back to your side while in a flat bed without using bedrails?: A Little Help needed moving from lying on your back to sitting on the side of a flat bed without using bedrails?: A Little Help needed moving to and from a bed to a chair (including a wheelchair)?: A Little Help needed standing up from a chair using your arms (e.g., wheelchair or bedside chair)?: A Little Help needed to walk in hospital room?: A Little Help needed climbing 3-5 steps with a railing? : A Little 6 Click Score: 18    End of Session Equipment Utilized During Treatment: Gait belt Activity Tolerance: Patient tolerated treatment well;Patient limited by fatigue Patient left: with call bell/phone within reach;in chair;with family/visitor present Nurse Communication: Mobility status PT Visit Diagnosis: Other abnormalities of gait and mobility (R26.89);Unsteadiness on feet (R26.81)    Time: 7124-5809 PT Time Calculation (min) (ACUTE ONLY): 27 min   Charges:   PT Evaluation $PT Eval Low Complexity: 1 Low PT Treatments $Gait Training: 8-22 mins        Baxter Flattery, PT  Acute Rehab Dept ([place]) [phone] Pager [phone]  10/19/2020   [facility] 10/19/2020, 11:35 AM

## 2020-10-19 NOTE — Progress Notes (Signed)
SATURATION QUALIFICATIONS: (This note is used to comply with regulatory documentation for home oxygen)  Patient Saturations on Room Air at Rest = 94%  Patient Saturations on Room Air while Ambulating = 87%  Patient Saturations on 2 Liters of oxygen while Ambulating = 94%  Please briefly explain why patient needs home oxygen: Pt continues to  Be short of breath with minimal exertion. Her 02 sat dropped today to 87 percent on room air with ambulation. She will need home 02 based on todays results.

## 2020-10-19 NOTE — Plan of Care (Signed)
  Problem: Clinical Measurements: Goal: Respiratory complications will improve Outcome: Progressing   Problem: Clinical Measurements: Goal: Cardiovascular complication will be avoided Outcome: Progressing   Problem: Coping: Goal: Level of anxiety will decrease Outcome: Progressing   Problem: Elimination: Goal: Will not experience complications related to bowel motility Outcome: Progressing   Problem: Safety: Goal: Ability to remain free from injury will improve Outcome: Progressing   Problem: Safety: Goal: Ability to remain free from injury will improve Outcome: Progressing

## 2020-10-19 NOTE — Plan of Care (Signed)
  Problem: Coping: Goal: Level of anxiety will decrease Outcome: Progressing   Problem: Pain Managment: Goal: General experience of comfort will improve Outcome: Progressing   Problem: Safety: Goal: Ability to remain free from injury will improve Outcome: Progressing   

## 2020-10-19 NOTE — Progress Notes (Signed)
Contacted Valerie Long with [employer] for home O2 referral and he accepted it.

## 2020-10-19 NOTE — Evaluation (Signed)
Occupational Therapy Evaluation Patient Details Name: Valerie Long MRN: [id number] DOB: 1945-07-30 Today's Date: 10/19/2020    History of Present Illness 75 y.o. 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   Clinical Impression   Patient is currently requiring assistance with ADLs including minimal to [facility] assist with toileting, moderate assist with LE dressing, moderate assist with bathing, and minimal assist with grooming and UE dressing, all of which is below patient's typical baseline of being Independent prior to December 2021.  Since this time pt reports dependence on family for "everything." During this evaluation, patient was limited by severely limited activity tolerance but with vitals stable and SpO2 lowering to 92% with activity with recovery to 96% in chair. Additional limitations include mild unsteadiness with ambulation, and quick muscle fatigue, which has the potential to impact patient's safety and independence during functional mobility, as well as performance for ADLs. [place] "6-clicks" Daily Activity Inpatient Short Form score of 17/24 indicates 50.11% ADL impairment this session. Patient lives with her 32 y/o Mother and 1 of 4 sisters rotate weekly to  provide 24/7 supervision and assistance.  Patient demonstrates fair rehab potential based on prognosis, and should benefit from continued skilled occupational therapy services while in acute care to maximize safety, independence and quality of life at home.  Continued occupational therapy services in the home is recommended.  ?    Follow Up Recommendations  Home health OT    Equipment Recommendations  3 in 1 bedside commode;Other  (comment) (Rollator)    Recommendations for Other Services       Precautions / Restrictions Precautions Precautions: Fall Precaution Comments: monitor O2 Restrictions Weight Bearing Restrictions: No      Mobility Bed Mobility               General bed mobility comments: pt in recliner Patient Response: Flat affect  Transfers Overall transfer level: Needs assistance Equipment used: Rolling walker (2 wheeled) Transfers: Sit to/from Stand Sit to Stand: Min guard         General transfer comment: Cues for hand placement with sit <>stand from recliner.    Balance Overall balance assessment: Needs assistance Sitting-balance support: Feet supported;No upper extremity supported Sitting balance-Leahy Scale: Good Sitting balance - Comments: Fatigues quickly and needs back support.     Standing balance-Leahy Scale: Fair Standing balance comment: reliant on UEs for balance                           ADL either performed or assessed with clinical judgement   ADL Overall ADL's : Needs assistance/impaired Eating/Feeding: Independent   Grooming: Sitting;Set up Grooming Details (indicate cue type and reason): With rest breaks Upper Body Bathing: Minimal assistance;Sitting   Lower Body Bathing: Moderate assistance;Sitting/lateral leans;Sit to/from stand Lower Body Bathing Details (indicate cue type and reason): Assist due to fatigue Upper Body Dressing : Minimal assistance;Set up;Sitting   Lower Body Dressing: Moderate assistance;Sit to/from stand;Sitting/lateral leans Lower Body Dressing Details (indicate cue type and reason): Pt doffed each sock while in recliner with need of rest break in between feet.  Too fatigued to don socks, requiring total assist for this.  Overall Moderate assist for full LE dressing. Toilet Transfer: BSC;Min Marine scientist Details (indicate cue type  and reason): Short distance ambulation in room with with RW to  [facility].  Pt's sister shown how to use BSC in 3 ways and how to line with plastic bag for easy clean up. Toileting- Clothing Manipulation and Hygiene: Minimal assistance;Sitting/lateral lean;Sit to/from Nurse, children's Details (indicate cue type and reason): NT but would anticipate at least Min As to steady as stepping over lip of tub. Functional mobility during ADLs: Min guard;Supervision/safety;Rolling walker       Vision   Vision Assessment?: No apparent visual deficits     Perception     Praxis      Pertinent Vitals/Pain Pain Assessment: 0-10 Pain Score: 4  Pain Location: LT ABD only when coughing.  Back pain which pt reports is chronic. Pain Intervention(s): Monitored during session;Limited activity within patient's tolerance;Repositioned (Pt denied need of pain meds.)     Hand Dominance     Extremity/Trunk Assessment Upper Extremity Assessment Upper Extremity Assessment: Overall WFL for tasks assessed (fatigues quickly with MMT)   Lower Extremity Assessment Lower Extremity Assessment: Defer to PT evaluation   Cervical / Trunk Assessment Cervical / Trunk Assessment: Normal   Communication Communication Communication: Prefers language other than [place];Other (comment) (Primarily Spanish speaking, but speaks and understands English very well.)   Cognition Arousal/Alertness: Awake/alert Behavior During Therapy: WFL for tasks assessed/performed Overall Cognitive Status: Within Functional Limits for tasks assessed                                 General Comments: Ox4   General Comments       Exercises     Shoulder Instructions      Home Living Family/patient expects to be discharged to:: Private residence Living Arrangements: Other relatives (Lives with 71 year old mother who is in good health.) Available Help at Discharge: Family;Available 24 hours/day Type of Home: House Home Access: Stairs to enter [employer] of Steps:  6-7 Entrance Stairs-Rails: Left Home Layout: Two level;Able to live on main level with bedroom/bathroom;Full bath on main level     Bathroom Shower/Tub: Teacher, early years/pre: Standard     Home Equipment: Walker - 4 wheels;Hand held shower head;Hospital bed   Additional Comments: sisters rotate staying with pt and there mother assisting prn.  Mother uses the 4 wheeled walker and pt must share it.  Pt would like her own Rollator.  Sister clarified that they do NOT have a bedside commode, but would like one.  Pt reports that she has a hospital bed as well as a regular adjustable bed and a motorized lift recliner and pt sleeps in any of them. Sister reports the pt is often in the recliner to sleep.      Prior Functioning/Environment Level of Independence: Independent with assistive device(s);Needs assistance  Gait / Transfers Assistance Needed: amb with rollator, very short distances ADL's / Homemaking Assistance Needed: assist from sisters with "everything" since onset of symptoms beginning in December 2021.            OT Problem List: Decreased activity tolerance;Impaired balance (sitting and/or standing);Pain;Decreased strength;Decreased knowledge of use of DME or AE      OT Treatment/Interventions: Self-care/ADL training;Therapeutic activities;Therapeutic exercise;Energy conservation;Patient/family education;DME and/or AE instruction;Balance training    OT Goals(Current goals can be found in the care plan section) Acute Rehab OT Goals Patient Stated Goal: Have more energy OT Goal Formulation: With patient/family Time For Goal  Achievement: 11/02/20 Potential to Achieve Goals: Fair ADL Goals Additional ADL Goal #1: Patient/family will identify at least 3 energy conservation strategies to employ at home in order to maximize function and quality of life and decrease caregiver burden while preventing exacerbation of symptoms and rehospitalization. Additional ADL Goal #2:  Patient/family will identify adaptive equipment options for bathing, dressing, and toileting in order to decrease caregiver burden and maximize pt's independence and dignity during ADLs. Additional ADL Goal #3: Pt will participate in light UE exercises as tolerated in order to support pt's goal of increased endurance, and pt will initiate rest breaks prior to an RPE of 6/10, without cues to do so, in order to prevent overfatigue and regression.  OT Frequency: Min 2X/week   Barriers to D/C:            Co-evaluation              AM-PAC OT "6 Clicks" Daily Activity     Outcome Measure Help from another person eating meals?: None Help from another person taking care of personal grooming?: A Little Help from another person toileting, which includes using toliet, bedpan, or urinal?: A Little Help from another person bathing (including washing, rinsing, drying)?: A Lot Help from another person to put on and taking off regular upper body clothing?: A Little Help from another person to put on and taking off regular lower body clothing?: A Lot 6 Click Score: 17   End of Session Equipment Utilized During Treatment: Rolling walker  Activity Tolerance: Patient limited by fatigue Patient left: in chair;with family/visitor present;with call bell/phone within reach  OT Visit Diagnosis: Pain;Unsteadiness on feet (R26.81) (needs assistance with ADLs) Pain - Right/Left: Left Pain - part of body:  (ABD.  Back)                Time: 6295-2841 OT Time Calculation (min): 27 min Charges:  OT General Charges $OT Visit: 1 Visit OT Evaluation $OT Eval Low Complexity: 1 Low OT Treatments $Self Care/Home Management : 8-22 mins  Anderson Malta, OT Acute Rehab Services Office: [phone] 10/19/2020  Julien Girt 10/19/2020, 12:54 PM

## 2020-10-19 NOTE — Consult Note (Signed)
Consultation Note Date: 10/19/2020   Patient Name: Valerie Long  DOB: 1945/12/10  MRN: [id number]  Age / Sex: 75 y.o., female  PCP: Colon Branch, MD Referring Physician: Eugenie Filler, MD  Reason for Consultation: Establishing goals of care  HPI/Patient Profile: 75 y.o. female admitted on 10/16/2020    Valerie Long a 75 y.o.femalewith medical history significant forstage IV right lung adenocarcinoma with pulmonary and osseous metastases, malignant bilateral pleural effusions s/p right Pleurx catheter placement, hyperlipidemia whois admitted with shortness of breath in setting of malignant bilateral pleural effusions and dehydration.  Clinical Assessment and Goals of Care:  Patient remains admitted to hospital medicine service for symptomatic malignant pleural effusion, thrush, dehydration, with life limiting illness of stage IV lung adenocarcinoma with pulmonary and osseous mets, was on treatment with Nivolumab under the care of Dr Earlie Server, hence will not consider steroids for her fatigue and lungs as she is on immunotherapy. I met with her and her sister at bedside, I have also discussed with Dr Grandville Silos [facility] MD.   I introduced myself and palliative care as follows: Palliative medicine is specialized medical care for people living with serious illness. It focuses on providing relief from the symptoms and stress of a serious illness. The goal is to improve quality of life for both the patient and the family.  Goals of care: Broad aims of medical therapy in relation to the patient's values and preferences. Our aim is to provide medical care aimed at enabling patients to achieve the goals that matter most to them, given the circumstances of their particular medical situation and their constraints.   Goals, wishes and values attempted to be explored, patient lives at home with mother and sister, it is  important to her to be at home, with her family. She is asking about when she will get to go home. Patient's sister states that they have some type of home health care at present. They are familiar with palliative care, she reportedly was to have outpatient palliative care but hasn't had a first visit with them yet. We talked about how palliative services might be of benefit going forward, patient and sister are agreeable, see below.   NEXT OF KIN  mother and sisters.   SUMMARY OF RECOMMENDATIONS    Agree with DNR Continue current mode of care Continue current pain and non pain symptom management Recommend out patient palliative services follow up with the patient at home on discharge, she lives with her mother and sisters, will re discuss with Manufacturing engineer liaison as well as with onc colleague NP Erasmo Downer on 10-20-20 regarding the patient's anti cancer therapy and recent imaging, recent functional status etc, so as to be able to decide whether the patient would benefit from either hospice or palliative services in the outpatient setting.  Thank you for the consult.   Code Status/Advance Care Planning:  DNR    Symptom Management:      Palliative Prophylaxis:   Delirium Protocol   Psycho-social/Spiritual:   Desire for further  Chaplaincy support:yes  Additional Recommendations: Caregiving  Support/Resources  Prognosis:   Unable to determine  Discharge Planning: To Be Determined      Primary Diagnoses: Present on Admission: . Pleural effusion, bilateral . Adenocarcinoma of right lung, stage 4 ([place]) . Hyponatremia   I have reviewed the medical record, interviewed the patient and family, and examined the patient. The following aspects are pertinent.  Past Medical History:  Diagnosis Date  . Adenocarcinoma of right lung, stage 4 ([place]) 2017   lump nodes  . Anemia   . Arthritis   . Bone metastases ([place]) 02/24/2017  . Encounter for antineoplastic chemotherapy  03/17/2016  . Hypercholesterolemia   . Hypertension 06/18/2016   just when I go to the hospital   . Osteopenia   . 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 Pneumonia   . Shortness of breath dyspnea   . 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 Marital status: Legally Separated    Spouse name: Not on file  . Number of children: 1  . Years of education: Not on file  . Highest education level: Not on file  Occupational History  . Occupation: retired     Fish farm manager: SELF EMPLOYED  Tobacco Use  . Smoking status: Never Smoker  . Smokeless tobacco: Never Used  Vaping Use  . Vaping Use: Never used  Substance and Sexual Activity  . Alcohol use: Not Currently    Alcohol/week: 0.0 standard drinks  . Drug use: No  . Sexual activity: Not Currently    Comment: 1st intercourse 75yo-Fewer than 5 partners  Other Topics Concern  . Not on file  Social History Narrative   From [place], moved from [place]  to [place] 2008   Lives w/ her mother       Right handed      Highest level of edu- 7th grade      Retired from [employer] Strain: Not on [employer] Insecurity: Not on file  Transportation Needs: Not on file  Physical Activity: Not on file  Stress: Not on file  Social Connections: Not on file   Family History  Problem Relation Age of Onset  . Hypertension Mother        F and M  . CAD Father   . Diabetes Other        auncle-aunts   . Cancer Other        UTERINE   . Breast cancer Sister   . Stroke Neg Hx   . Colon cancer Neg Hx    Scheduled Meds: . Chlorhexidine Gluconate Cloth  6 each Topical Daily  . fluconazole  200 mg Oral Daily  . mirtazapine  15 mg Oral QHS  . nystatin  5 mL Oral QID  . pantoprazole  40 mg Oral Q0600  . polyvinyl alcohol  1 drop Both Eyes BID  . senna-docusate  1 tablet Oral BID  . sodium chloride flush  10-40 mL Intracatheter Q12H    Continuous Infusions: PRN Meds:.acetaminophen **OR** acetaminophen, hydrOXYzine, lip balm, ondansetron **OR** ondansetron (ZOFRAN) IV, oxyCODONE, prochlorperazine **OR** prochlorperazine, sodium chloride flush, traMADol Medications Prior to Admission:  Prior to Admission medications   Medication Sig Start Date End Date Taking? Authorizing Provider  acetaminophen (TYLENOL) 500 MG tablet Take 1,000 mg by mouth every 6 (six) hours as needed for moderate pain  or mild pain.   Yes [provider]  Cholecalciferol (VITAMIN D3 PO) Take 1,200 mg by mouth daily.   Yes [provider]  Glycerin-Hypromellose-PEG 400 (DRY EYE RELIEF DROPS) 0.2-0.2-1 % SOLN Place 1 drop into both eyes 2 (two) times daily.   Yes [provider]  prochlorperazine (COMPAZINE) 10 MG tablet TAKE 1 TABLET BY MOUTH EVERY 6 HOURS AS NEEDED FOR NAUSEA OR VOMITING. Patient taking differently: Take 10 mg by mouth every 8 (eight) hours as needed for nausea or vomiting. 10/13/20  Yes Curt Bears, MD  amLODipine (NORVASC) 2.5 MG tablet Take 1 tablet (2.5 mg total) by mouth daily. Patient not taking: No sig reported 10/16/20   Copland, Gay Filler, MD  cyclobenzaprine (FLEXERIL) 10 MG tablet Take 1 tablet (10 mg total) by mouth 2 (two) times daily as needed for muscle spasms. Patient not taking: No sig reported 09/09/20   Gareth Morgan, MD  dronabinol (MARINOL) 2.5 MG capsule Take 1 capsule (2.5 mg total) by mouth 2 (two) times daily before a meal. Patient not taking: Reported on 10/16/2020 07/16/20   Curt Bears, MD  Ferrous Sulfate (IRON PO) Take 352 mg by mouth daily. Patient not taking: Reported on 10/16/2020    [provider]  fluconazole (DIFLUCAN) 100 MG tablet Take 1 tablet (100 mg total) by mouth daily. 10/15/20   Curt Bears, MD  hydrOXYzine (ATARAX/VISTARIL) 25 MG tablet Take 2 tablets (50 mg total) by mouth every 8 (eight) hours as needed for anxiety, nausea or vomiting (before bed  for sleep). Patient not taking: No sig reported 09/09/20   Gareth Morgan, MD  lidocaine-prilocaine (EMLA) cream Apply 1 application topically daily as needed (every four weeks). Patient not taking: Reported on 10/16/2020    [provider]  methylPREDNISolone (MEDROL DOSEPAK) 4 MG TBPK tablet Use as instructed. Patient not taking: No sig reported 08/20/20   Curt Bears, MD  Omega-3 1000 MG CAPS Take 1,000 mg by mouth daily as needed (sore throat). Patient not taking: Reported on 10/16/2020    [provider]  ondansetron (ZOFRAN ODT) 4 MG disintegrating tablet Take 1 tablet (4 mg total) by mouth every 8 (eight) hours as needed for nausea or vomiting. Patient not taking: No sig reported 09/09/20   Gareth Morgan, MD  pantoprazole (PROTONIX) 20 MG tablet Take 2 tablets (40 mg total) by mouth daily for 14 days. Patient not taking: Reported on 10/16/2020 09/23/20 10/07/20  Curt Bears, MD  traMADol (ULTRAM) 50 MG tablet Take 1 tablet (50 mg total) by mouth every 6 (six) hours as needed for moderate pain or severe pain. Patient not taking: No sig reported 08/08/20   Melrose Nakayama, MD   No Known Allergies Review of Systems + fatigue  Physical Exam Appears with generalized weakness Decreased breath sounds Regular Sitting in chair Has some trace edema No focal deficits Appears fatigued Abdomen not distended Has PleurX  Vital Signs: BP (!) 153/82 (BP Location: Right Arm)   Pulse (!) 103   Temp 98.6 F (37 C)   Resp 14   LMP 06/09/1998   SpO2 100%  Pain Scale: 0-10 POSS *See Group Information*: 1-Acceptable,Awake and alert Pain Score: 3    SpO2: SpO2: 100 % O2 Device:SpO2: 100 % O2 Flow Rate: .O2 Flow Rate (L/min): 2 L/min  IO: Intake/output summary:   Intake/Output Summary (Last 24 hours) at 10/19/2020 1057 Last data filed at 10/19/2020 0951 Gross per 24 hour  Intake 1849.97 ml  Output --  Net  1849.97 ml    LBM: Last BM Date: 10/18/20 Baseline  Weight:   Most recent weight:       Palliative Assessment/Data:   PPS 40%  Time In:  9.30 Time Out:  10.30 Time Total:   60  Greater than 50%  of this time was spent counseling and coordinating care related to the above assessment and plan.  Signed by: Loistine Chance, MD   Please contact Palliative Medicine Team phone at [phone] for questions and concerns.  For individual provider: See Shea Evans

## 2020-10-19 NOTE — Progress Notes (Signed)
PROGRESS NOTE    Valerie Long  TIR:[id number] DOB: 12-May-1946 DOA: 10/16/2020 PCP: Colon Branch, MD    Chief Complaint  Patient presents with  . Shortness of Breath    Brief Narrative:  Valerie Long is a 75 y.o. 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   Assessment & Plan:   Principal Problem:   Pleural effusion, bilateral Active Problems:   Adenocarcinoma of right lung, stage 4 (HCC)   Hyponatremia   Oral thrush   Malignant pleural effusion  1 symptomatic malignant bilateral pleural effusion -Patient presented with worsening shortness of breath. -Patient status post right Pleurx catheter placement 08/08/2020 per CT surgery, Dr. Roxan Hockey. -CT angiogram chest done on admission negative for PE however shows mild increase in size of right pleural effusion, stable moderate to large size left pleural effusion. -Patient status post ultrasound-guided thoracentesis with 800 cc of fluid removed per IR 10/17/2020. -CT surgery consulted to evaluate right Pleurx catheter and for right Pleurx catheter management. -CT surgery discussed with RN on 10/17/2020 and Pleurx drained 170 cc of mucopurulent drainage and per RN's note it was verified per CT surgery that as Pleurx was draining, no further management needed from their perspective. -Ambulatory sats checked and patient will be set up for home oxygen. -Palliative care consulted for goals of care. -Oncology consulted and following.  2.  Oral thrush -Continue nystatin swish and swallow and oral Diflucan.   3.  Hyponatremia/dehydration -Patient clinically dehydrated on examination secondary to poor oral intake likely secondary to oral thrush. -Improving with  hydration.   -Supportive care.  4.  Stage IV right lung adenocarcinoma with pulmonary and osseous mets -Currently on active treatment with nivolumab status post cycle 3 on 09/23/2020. -Oncology consulted and following. -Palliative care also consulted for goals of care.  5.  Nausea/emesis -Improved on IV antiemetics and antifungals..   -Currently tolerating diet.      DVT prophylaxis: scd Code Status: DNR (discussed with patient DNI/partial code versus DNR and patient is in agreement to switch back to DNR.) Family Communication: Updated patient and sister at bedside. Disposition:   Status is: Inpatient    Dispo: The patient is from: Home              Anticipated d/c is to: Home with palliative care following              Patient currently being evaluated for worsening shortness of breath, bilateral pleural effusions, not stable for discharge.   Difficult to place patient: No       Consultants:   Palliative care: Dr. Rowe Pavy 10/19/2020  Oncology  Procedures:   Ultrasound-guided thoracentesis-800 cc removed per IR, Dr. Kathlene Cote 10/17/2020  CT angiogram chest 10/16/2020  Chest x-ray 10/17/2020, 10/16/2020  Antimicrobials:   None   Subjective: Sitting up in chair.  Sister at bedside.  Denies any chest pain.  Shortness of breath at rest has improved.  Still with shortness of breath on exertion with slight improvement per patient.  No abdominal pain.  Pain currently controlled.    Objective: Vitals:   10/19/20 1318 10/19/20 1345 10/19/20 1350 10/19/20 1355  BP: (!) 150/92     Pulse: (!) 102 98 100 100  Resp: 16 18 18 18   Temp: 97.7 F (36.5 C)  TempSrc: Oral     SpO2: 95% (!) 87% 94% 95%    Intake/Output Summary (Last 24 hours) at 10/19/2020 1758 Last data filed at 10/19/2020 1741 Gross per 24 hour  Intake 1840 ml  Output --  Net 1840 ml   There were no vitals filed for this visit.  Examination:  General exam: Frail.  Oral thrush. Respiratory system:  Decreased breath sounds in the bases.  Fair air movement.  No wheezing.  Normal respiratory effort.  Speaking in full sentences.  Cardiovascular system: RRR no murmur rubs or gallops.  No JVD.  No lower extremity edema.   Gastrointestinal system: Abdomen is soft, nontender, nondistended, positive bowel sounds.  No rebound.  No guarding.  Central nervous system: Alert and oriented.  Moving extremity spontaneously.  No focal neurological deficits. Extremities: Symmetric 5 x 5 power. Skin: No rashes, lesions or ulcers Psychiatry: Judgement and insight appear normal. Mood & affect appropriate.     Data Reviewed: I have personally reviewed following labs and imaging studies  CBC: Recent Labs  Lab 10/16/20 1352 10/17/20 0333 10/18/20 0321 10/19/20 0750  WBC 7.1 5.5 7.4 7.6  NEUTROABS 6.0  --  5.3  --   HGB 11.9* 10.1* 10.7* 10.4*  HCT 38.0 32.8* 35.5* 34.0*  MCV 84.8 86.3 89.0 88.3  PLT 335 271 317 616    Basic Metabolic Panel: Recent Labs  Lab 10/16/20 1352 10/17/20 0333 10/18/20 0321 10/19/20 0750  NA 130* 132* 136 132*  K 4.7 4.9 5.1 4.6  CL 90* 95* 96* 95*  CO2 31 33* 31 33*  GLUCOSE 105* 98 87 103*  BUN 12 12 17 10   CREATININE 0.77 0.83 0.75 0.56  CALCIUM 9.3 8.6* 9.1 8.7*    GFR: CrCl cannot be calculated (Unknown ideal weight.).  Liver Function Tests: Recent Labs  Lab 10/16/20 1352  AST 31  ALT 15  ALKPHOS 311*  BILITOT 0.9  PROT 7.6  ALBUMIN 3.2*    CBG: No results for input(s): GLUCAP in the last 168 hours.   Recent Results (from the past 240 hour(s))  SARS CORONAVIRUS 2 (TAT 6-24 HRS) Nasopharyngeal Nasopharyngeal Swab     Status: None   Collection Time: 10/16/20  7:25 PM   Specimen: Nasopharyngeal Swab  Result Value Ref Range Status   SARS Coronavirus 2 NEGATIVE NEGATIVE Final    Comment: (NOTE) SARS-CoV-2 target nucleic acids are NOT DETECTED.  The SARS-CoV-2 RNA is generally detectable in upper and lower respiratory specimens during the  acute phase of infection. Negative results do not preclude SARS-CoV-2 infection, do not rule out co-infections with other pathogens, and should not be used as the sole basis for treatment or other patient management decisions. Negative results must be combined with clinical observations, patient history, and epidemiological information. The expected result is Negative.  Fact Sheet for Patients: SugarRoll.be  Fact Sheet for Healthcare Providers: https://www.woods-mathews.com/  This test is not yet approved or cleared by the [place] FDA and  has been authorized for detection and/or diagnosis of SARS-CoV-2 by FDA under an Emergency Use Authorization (EUA). This EUA will remain  in effect (meaning this test can be used) for the duration of the COVID-19 declaration under Se ction 564(b)(1) of the Act, 21 U.S.C. section 360bbb-3(b)(1), unless the authorization is terminated or revoked sooner.  Performed at [facility], [place] [address] [place], [place] [address]          Radiology Studies: No results found.  Scheduled Meds: . Chlorhexidine Gluconate Cloth  6 each Topical Daily  . fluconazole  200 mg Oral Daily  . mirtazapine  15 mg Oral QHS  . nystatin  5 mL Oral QID  . pantoprazole  40 mg Oral Q0600  . polyvinyl alcohol  1 drop Both Eyes BID  . senna-docusate  1 tablet Oral BID  . sodium chloride flush  10-40 mL Intracatheter Q12H   Continuous Infusions:    LOS: 2 days    Time spent: 35 minutes    Irine Seal, MD [facility]   To contact the attending provider between 7A-7P or the covering provider during after hours 7P-7A, please log into the web site [email or web address] and access using universal [place] password for that web site. If you do not have the password, please call the hospital operator.  10/19/2020, 5:58 PM

## 2020-10-20 ENCOUNTER — Telehealth: Payer: Self-pay

## 2020-10-20 DIAGNOSIS — Z7189 Other specified counseling: Secondary | ICD-10-CM

## 2020-10-20 DIAGNOSIS — C3491 Malignant neoplasm of unspecified part of right bronchus or lung: Secondary | ICD-10-CM | POA: Diagnosis not present

## 2020-10-20 DIAGNOSIS — B370 Candidal stomatitis: Secondary | ICD-10-CM | POA: Diagnosis not present

## 2020-10-20 DIAGNOSIS — J90 Pleural effusion, not elsewhere classified: Secondary | ICD-10-CM | POA: Diagnosis not present

## 2020-10-20 DIAGNOSIS — R0602 Shortness of breath: Secondary | ICD-10-CM

## 2020-10-20 DIAGNOSIS — J910 Malignant pleural effusion: Secondary | ICD-10-CM | POA: Diagnosis not present

## 2020-10-20 MED ORDER — LIDOCAINE 5 % EX PTCH
1.0000 | MEDICATED_PATCH | CUTANEOUS | Status: DC
  Administered 2020-10-20: 1 via TRANSDERMAL
  Filled 2020-10-20: qty 1

## 2020-10-20 MED ORDER — LIP MEDEX EX OINT: TOPICAL_OINTMENT | CUTANEOUS | 0 refills | Status: AC | PRN

## 2020-10-20 MED ORDER — NYSTATIN 100000 UNIT/ML MT SUSP: 5.0000 mL | Freq: Four times a day (QID) | OROMUCOSAL | 0 refills | Status: AC

## 2020-10-20 MED ORDER — FLUCONAZOLE 40 MG/ML PO SUSR: 200.0000 mg | Freq: Every day | ORAL | 1 refills | Status: AC

## 2020-10-20 MED ORDER — HEPARIN SOD (PORK) LOCK FLUSH 100 UNIT/ML IV SOLN
500.0000 [IU] | INTRAVENOUS | Status: AC | PRN
Start: 2020-10-20 — End: 2020-10-20
  Administered 2020-10-20: 500 [IU]
  Filled 2020-10-20: qty 5

## 2020-10-20 MED ORDER — SENNOSIDES-DOCUSATE SODIUM 8.6-50 MG PO TABS: 1.0000 | ORAL_TABLET | Freq: Two times a day (BID) | ORAL | Status: DC

## 2020-10-20 MED ORDER — HYDROXYZINE HCL 25 MG PO TABS: 50.0000 mg | ORAL_TABLET | Freq: Three times a day (TID) | ORAL | 0 refills | Status: AC | PRN

## 2020-10-20 MED ORDER — LIDOCAINE 5 % EX PTCH: 1.0000 | MEDICATED_PATCH | CUTANEOUS | 0 refills | Status: AC

## 2020-10-20 MED ORDER — AMLODIPINE BESYLATE 5 MG PO TABS
2.5000 mg | ORAL_TABLET | Freq: Every day | ORAL | Status: DC
  Administered 2020-10-20: 2.5 mg via ORAL
  Filled 2020-10-20: qty 1

## 2020-10-20 MED ORDER — PANTOPRAZOLE SODIUM 40 MG PO TBEC: 40.0000 mg | DELAYED_RELEASE_TABLET | Freq: Every day | ORAL | 1 refills | Status: AC

## 2020-10-20 NOTE — Progress Notes (Signed)
PMT brief progress note  Resting in bed, no distress, generalized weakness BP (!) 157/82 (BP Location: Right Arm)   Pulse (!) 104   Temp 98.3 F (36.8 C) (Oral)   Resp 16   LMP 06/09/1998   SpO2 100%  Labs and chart noted Discussed with onc, TRH and TOC Ok to proceed with home with palliative Outpatient med onc follow up PPS 40% No additional PMT specific recommendations at this time 15 minutes spent Greater than 50%  of this time was spent counseling and coordinating care related to the above assessment and plan. Loistine Chance MD [facility]

## 2020-10-20 NOTE — Progress Notes (Signed)
HEMATOLOGY-ONCOLOGY PROGRESS NOTE  SUBJECTIVE: The patient is sitting up in the chair.  She reports ongoing shortness of breath.  She thinks shortness of breath is somewhat improved compared to prior to admission.  Pleurx catheter was drained on Friday with 170 cc of fluid removed.  She is not having any abdominal pain, nausea, vomiting this morning.  Anticipates discharged home possibly later today.  Oncology History  Adenocarcinoma of right lung, stage 4 ([place])  08/19/2015 Initial Diagnosis   Adenocarcinoma of right lung, stage 4 ([place])   07/22/2020 -  Chemotherapy    Patient is on Treatment Plan: LUNG NIVOLUMAB Q28D        REVIEW OF SYSTEMS:   Constitutional: Denies fevers, chills Eyes: Denies blurriness of vision Ears, nose, mouth, throat, and face: Reports thrush. Respiratory: Has ongoing shortness of breath, somewhat improved compared to prior to admission Cardiovascular: Denies chest pain Gastrointestinal: Denies abdominal pain, nausea, vomiting Skin: Denies abnormal skin rashes Lymphatics: Denies new lymphadenopathy or easy bruising Neurological:Denies numbness, tingling or new weaknesses Behavioral/Psych: Mood is stable, no new changes  Extremities: No lower extremity edema All other systems were reviewed with the patient and are negative.  I have reviewed the past medical history, past surgical history, social history and family history with the patient and they are unchanged from previous note.   PHYSICAL EXAMINATION: ECOG PERFORMANCE STATUS: 2 - Symptomatic, <50% confined to bed  Vitals:   10/19/20 2130 10/20/20 0505  BP: (!) 170/89 (!) 157/82  Pulse: (!) 106 (!) 104  Resp: 16 16  Temp: 98.3 F (36.8 C) 98.3 F (36.8 C)  SpO2: 97% 100%   There were no vitals filed for this visit.  Intake/Output from previous day: 05/15 0701 - 05/16 0700 In: 1360 [P.O.:1360] Out: -   GENERAL: Chronically ill-appearing female, no distress SKIN: skin color, texture, turgor  are normal, no rashes or significant lesions EYES: No scleral icterus OROPHARYNX: Thrush improved LUNGS: Diminished breath sounds bilateral bases HEART: regular rate & rhythm and no murmurs and no lower extremity edema ABDOMEN:abdomen soft, non-tender and normal bowel sounds  NEURO: alert & oriented x 3 with fluent speech, no focal motor/sensory deficits  LABORATORY DATA:  I have reviewed the data as listed CMP Latest Ref Rng & Units 10/19/2020 10/18/2020 10/17/2020  Glucose 70 - 99 mg/dL 103(H) 87 98  BUN 8 - 23 mg/dL 10 17 12   Creatinine 0.44 - 1.00 mg/dL 0.56 0.75 0.83  Sodium 135 - 145 mmol/L 132(L) 136 132(L)  Potassium 3.5 - 5.1 mmol/L 4.6 5.1 4.9  Chloride 98 - 111 mmol/L 95(L) 96(L) 95(L)  CO2 22 - 32 mmol/L 33(H) 31 33(H)  Calcium 8.9 - 10.3 mg/dL 8.7(L) 9.1 8.6(L)  Total Protein 6.5 - 8.1 g/dL - - -  Total Bilirubin 0.3 - 1.2 mg/dL - - -  Alkaline Phos 38 - 126 U/L - - -  AST 15 - 41 U/L - - -  ALT 0 - 44 U/L - - -    Lab Results  Component Value Date   WBC 7.6 10/19/2020   HGB 10.4 (L) 10/19/2020   HCT 34.0 (L) 10/19/2020   MCV 88.3 10/19/2020   PLT 287 10/19/2020   NEUTROABS 5.3 10/18/2020    DG Chest 1 View  Result Date: 10/17/2020 CLINICAL DATA:  Post left thoracentesis EXAM: CHEST  1 VIEW COMPARISON:  10/16/2020 FINDINGS: Right Port-A-Cath and right PleurX catheter remain in place, unchanged. Decreasing left pleural effusion following thoracentesis. No pneumothorax. Small right effusion  is stable. Heart is mildly enlarged. Bilateral lower lobe airspace opacities, improving on the left following thoracentesis. IMPRESSION: Decreasing left pleural effusion following thoracentesis. No pneumothorax. Bilateral lower lobe airspace opacities, improving on the left since prior study. Electronically Signed   By: Rolm Baptise M.D.   On: 10/17/2020 10:20   DG Chest 1 View  Result Date: 09/22/2020 CLINICAL DATA:  Post thoracentesis on the left. EXAM: CHEST  1 VIEW COMPARISON:   Radiographs and CT 09/09/2020. FINDINGS: 1523 hours. The left pleural effusion has mildly decreased in volume. The right pleural effusion appears unchanged. Right pleural drainage catheter and right IJ Port-A-Cath remain in place. The heart size and mediastinal contours are stable. Bibasilar atelectasis and multiple pulmonary nodules are again noted. No pneumothorax. IMPRESSION: Decreased left pleural effusion and no pneumothorax following thoracentesis. Known bilateral pulmonary metastases are again seen. Electronically Signed   By: Richardean Sale M.D.   On: 09/22/2020 15:33   CT Angio Chest PE W/Cm &/Or Wo Cm  Result Date: 10/16/2020 CLINICAL DATA:  Shortness of breath.  Stage IV lung cancer. EXAM: CT ANGIOGRAPHY CHEST WITH CONTRAST TECHNIQUE: Multidetector CT imaging of the chest was performed using the standard protocol during bolus administration of intravenous contrast. Multiplanar CT image reconstructions and MIPs were obtained to evaluate the vascular anatomy. CONTRAST:  141mL OMNIPAQUE IOHEXOL 350 MG/ML SOLN COMPARISON:  Six 6 09/09/2020. Portable chest obtained earlier today. FINDINGS: Cardiovascular: Satisfactory opacification of the pulmonary arteries to the segmental level. No evidence of pulmonary embolism. Normal heart size. No pericardial effusion. Mediastinum/Nodes: No enlarged mediastinal, hilar, or axillary lymph nodes. Thyroid gland, trachea, and esophagus demonstrate no significant findings. Lungs/Pleura: Moderate right pleural effusion with a mild increase in size. Stable moderate to large-sized left pleural effusion. A right pleural catheter remains in place. No pneumothorax. No significant change in multiple bilateral lung nodules and masses. The lung bases are not included in their entirety. Upper Abdomen: The minimal portion of the included liver is unremarkable. The remainder of the upper abdomen is not included. Musculoskeletal: Extensive sclerotic bone lesions have not changed  significantly. Review of the MIP images confirms the above findings. IMPRESSION: 1. No pulmonary emboli. 2. Mild increase in size of a right pleural effusion and stable left pleural effusion. 3. No significant change in multiple bilateral lung masses and nodules. 4. Stable extensive sclerotic bone metastases. Electronically Signed   By: Claudie Revering M.D.   On: 10/16/2020 17:49   DG Chest Port 1 View  Result Date: 10/16/2020 CLINICAL DATA:  Shortness of breath.  Stage IV lung cancer. EXAM: PORTABLE CHEST 1 VIEW COMPARISON:  09/22/2020. FINDINGS: Normal sized heart with an interval decrease in size. No significant change in bilateral pleural effusions and bilateral lung nodules. Mildly increased bibasilar atelectasis. Stable thoracolumbar scoliosis. IMPRESSION: 1. No acute abnormality. 2. Mildly increased bibasilar atelectasis. 3. Stable bilateral pleural effusions and bilateral lung nodules compatible with pulmonary metastases. Electronically Signed   By: Claudie Revering M.D.   On: 10/16/2020 15:05   US THORACENTESIS ASP PLEURAL SPACE W/IMG GUIDE  Result Date: 10/17/2020 INDICATION: Stage IV right lung adenocarcinoma with pulmonary and osseous metastases and malignant bilateral pleural effusions. Request for therapeutic thoracentesis. EXAM: ULTRASOUND GUIDED LEFT THORACENTESIS MEDICATIONS: 1% lidocaine 8 mL COMPLICATIONS: None immediate. PROCEDURE: An ultrasound guided thoracentesis was thoroughly discussed with the patient and questions answered. The benefits, risks, alternatives and complications were also discussed. The patient understands and wishes to proceed with the procedure. Written consent was obtained. Ultrasound was performed  to localize and mark an adequate pocket of fluid in the left chest. The area was then prepped and draped in the normal sterile fashion. 1% Lidocaine was used for local anesthesia. Under ultrasound guidance a 6 Fr Safe-T-Centesis catheter was introduced. Thoracentesis was  performed. The catheter was removed and a dressing applied. FINDINGS: A total of approximately 800 mL of red fluid was removed. Limited ultrasound of the right chest showed that the right effusion is loculated. IMPRESSION: Successful ultrasound guided left thoracentesis yielding 800 mL of pleural fluid. No pneumothorax on post-procedure chest x-ray. Read by: Gareth Eagle, PA-C Electronically Signed   By: Aletta Edouard M.D.   On: 10/17/2020 10:28   US Thoracentesis Asp Pleural space w/IMG guide  Result Date: 09/22/2020 INDICATION: Recurrent pleural effusion due to stage IV lung cancer. Request for therapeutic thoracentesis. EXAM: ULTRASOUND GUIDED LEFT THORACENTESIS MEDICATIONS: 10 mL 1% lidocaine COMPLICATIONS: None immediate.  No pneumothorax on follow-up chest radiograph. PROCEDURE: An ultrasound guided thoracentesis was thoroughly discussed with the patient and questions answered. The benefits, risks, alternatives and complications were also discussed. The patient understands and wishes to proceed with the procedure. Written consent was obtained. Ultrasound was performed to localize and mark an adequate pocket of fluid in the left chest. The area was then prepped and draped in the normal sterile fashion. 1% Lidocaine was used for local anesthesia. Under ultrasound guidance a 6 Fr Safe-T-Centesis catheter was introduced. Thoracentesis was performed. The catheter was removed and a dressing applied. FINDINGS: A total of approximately 800 cc of clear, dark brown fluid was removed. Due to patient chest discomfort only the above amount of fluid was removed today. Post procedure chest X-ray reviewed, negative for pneumothorax. IMPRESSION: Successful ultrasound guided left thoracentesis yielding 800 cc of pleural fluid. Read by: Durenda Guthrie, PA-C Electronically Signed   By: Lucrezia Europe M.D.   On: 09/22/2020 15:39    ASSESSMENT AND PLAN: The patient is a 75 year old Hispanic female with metastatic non-small cell  lung cancer, adenocarcinoma initially diagnosed in March 2017.  She is status post induction systemic chemotherapy with carboplatin and Alimta and then received maintenance Alimta and completed 73 cycles.  She overall tolerated this treatment well but started to have increasing fatigue and weakness as well as shortness of breath.  She also had evidence of disease progression.  She is now undergoing second line treatment with immunotherapy consisting of nivolumab 480 mg IV every 4 weeks.  She is status post 3 cycles of treatment.  She also receives Zometa every 12 weeks for bone metastases.  The patient is now admitted with shortness of breath and bilateral pleural effusions.  She is status post left thoracentesis performed on 10/17/2020 with some improvement of her shortness of breath.  She has a right Pleurx catheter and placed that is draining a small amount.  Discussed with palliative care and hospitalist.  I talked with the patient about consideration of hospice upon discharge.  At this time, she is not ready to enroll in hospice.  She does agree to palliative care following her in the home.  She has a follow-up appointment scheduled with Dr. Julien Nordmann on Wednesday 5/18 and plans to discuss possible hospice with him at that point in time.  She has told me today that she is unsure if she wants to continue to take treatment but will again discuss this with Dr. Julien Nordmann when she sees him later this week.  I would continue her fluconazole 200 mg daily for a total of  10 days and she may continue her nystatin suspension for total 10 days.  For nausea and vomiting, recommend that she continue as needed Compazine.  For pain, she prefers to take Tylenol and tramadol.  We will see her in the office on Wednesday as previously scheduled.   LOS: 3 days   Mikey Bussing, DNP, AGPCNP-BC, AOCNP 10/20/20

## 2020-10-20 NOTE — Care Management (Signed)
Patient suffers from stage 4 adenocarcinoma of right lung and malignant pleural effusion which impairs her ability to perform daily activities like bathing, toileting, and dressing. A walker will not resolve issue with performing activities of daily living. A wheelchair will allow patient to safely perform daily activities. Patient can safely propel the wheelchair in the home or has a caregiver who can provide assistance.

## 2020-10-20 NOTE — Telephone Encounter (Signed)
Noted. Thanks.

## 2020-10-20 NOTE — Progress Notes (Signed)
[facility] ([facility])  Hospital Liaison: RN note         This patient has been referred to our palliative care services in the community.  [facility] will continue to follow for any discharge planning needs and to coordinate continuation of palliative care in the outpatient setting.    If you have questions or need assistance, please call [phone] or contact the hospital Liaison listed on AMION.      Thank you for this referral.         Mary Anne Robertson, RN, CCM  [facility] Hospital Liaison   [phone] 

## 2020-10-20 NOTE — TOC Transition Note (Signed)
Transition of Care [facility]) - CM/SW Discharge Note  Patient Details  Name: Valerie Long MRN: [id number] Date of Birth: 25-Mar-1946  [facility] of [facility] CM/SW Contact:  Sherie Don, LCSW Phone Number: 10/20/2020, 11:21 AM  Clinical Narrative: Patient to discharge home today. PT and OT evaluations recommended HH. Patient is requesting to stay with her current [facility] agency, [place]. Patient is also requesting a wheelchair; patient has a rollator at home.  CSW updated Georgina Snell with [facility] of discharge. HH orders for PT, OT, RN, and SW have been placed. Wheelchair narrative completed; wheelchair DME order has been placed. CSW made DME referral to [facility] with [employer]. [employer] to deliver wheelchair to patient's room.  TOC also received consult for OP palliative care and patient is agreeable to referral. CSW made referral to Bevely Palmer with [place]. Patient updated. TOC signing off.  Final next level of care: [place] Barriers to Discharge: Barriers Resolved  Patient Goals and CMS Choice Patient states their goals for this hospitalization and ongoing recovery are:: Discharge home with [facility] and outpatient palliative care CMS Medicare.gov Compare Post Acute Care list provided to:: Patient Choice offered to / list presented to : Patient  Discharge Plan and Services        DME Arranged: Wheelchair manual DME Agency: [employer] Date DME Agency Contacted: 10/20/20 Time DME Agency Contacted: [id number] Representative spoke with at DME Agency: [place] Arranged: RN,PT,OT,Social Work [facility] Agency: [place] Date [place]: 10/20/20 Time [place]: [phone] Representative spoke with at [place]: Georgina Snell  Readmission Risk Interventions Readmission Risk Prevention Plan 10/20/2020  Transportation Screening Complete  Medication Review [employer]) Complete  HRI or [place] Complete  SW Recovery Care/Counseling Consult Complete  [place] Not Applicable  Some recent data might be hidden

## 2020-10-20 NOTE — Discharge Summary (Signed)
Physician Discharge Summary  Valerie Long TKW:[id number] DOB: 04-08-1946 DOA: 10/16/2020  PCP: Colon Branch, MD  Admit date: 10/16/2020 Discharge date: 10/20/2020  Time spent: 55 minutes  Recommendations for Outpatient Follow-up:  1. Follow-up with Dr. Lorna Few as previously scheduled. 2. Follow-up with Colon Branch, MD in 2 to 3 weeks.  On follow-up patient will need a basic metabolic profile done to follow-up on electrolytes and renal function.  Patient's oral thrush will need to be reassessed at that time. 3. Patient will be discharged home with palliative care following.   Discharge Diagnoses:  Principal Problem:   Pleural effusion, bilateral Active Problems:   Adenocarcinoma of right lung, stage 4 (HCC)   Goals of care, counseling/discussion   Hyponatremia   Oral thrush   Malignant pleural effusion   Pleural effusion on left   SOB (shortness of breath)   Discharge Condition: Stable  Diet recommendation: Regular  There were no vitals filed for this visit.  History of present illness:  HPI per Dr. Gilman Buttner Long is a 75 y.o. 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  Patient states she has been having worsening shortness of breath over the last month. Dyspnea occurs with exertion and while at rest. She has had orthopnea. She has had cough productive of clear sputum. She has a right Pleurx catheter in place and notes that amount of output appears to be decreasing. Drainage that occurs appears bloody.  Over the last few days she has also noted oral pain. She has had difficulty maintaining adequate oral intake due to pain with swallowing foods and liquids. She does not experience any sensation of food being getting stuck in her chest. She was  noted to have oral thrush and a prescription for Diflucan was sent yesterday (5/11) but she has not yet picked this up. She is feeling thirsty and dehydrated. She has been having subjective fevers with some episodes of nausea and emesis. She has not had any chest pain, abdominal pain, dysuria, or diarrhea.  ED Course:  Initial vitals showed BP 160/93, pulse 112, RR 22, temp 96.8 F, SPO2 98% on room air.  Labs show sodium 130, potassium 4.7, bicarb 31, BUN 12, creatinine 0.77, serum glucose 105, AST 31, ALT 15, alk phos 311, total bilirubin 0.9, BNP 38.3. Urinalysis negative for UTI.  Portable chest x-ray showed stable bilateral pleural effusions and bilateral lung nodules.  CTA chest was negative for PE. Mild increase in right pleural effusion and stable left pleural effusion noted. Multiple bilateral lung masses and nodules noted without significant change. Stable extensive sclerotic bone metastases noted.  Patient was given oral Diflucan and IV Zofran. EDP discussed with oncology, Dr. Julien Nordmann who will follow and CT surgery who recommended patient may be admitted to [facility] long hospital. The hospitalist service was consulted to admit for further evaluation and management.  Hospital Course:  1 symptomatic malignant bilateral pleural effusion -Patient presented with worsening shortness of breath. -Patient status post right Pleurx catheter placement 08/08/2020 per CT surgery, Dr. Roxan Hockey. -CT angiogram chest done on admission negative for PE however shows mild increase in size of right pleural effusion, stable moderate to large size left pleural effusion. -Patient status post therapeutic ultrasound-guided thoracentesis with 800 cc of fluid removed per IR 10/17/2020. -CT surgery consulted to evaluate right Pleurx catheter  and for right Pleurx catheter management. -CT surgery discussed with RN on 10/17/2020 and Pleurx drained 170 cc of mucopurulent drainage and per RN's note it was verified per CT  surgery that as Pleurx was draining, no further management needed from their perspective. -Ambulatory sats checked and patient set up for home oxygen. -Palliative care consulted for goals of care and decision made to transition home with palliative care following at this time and further outpatient follow-up with oncology.. -Oncology consulted and followed the patient during the hospitalization.  2.  Oral thrush -Patient maintained on nystatin swish and swallow as well as oral Diflucan.   -Patient improved clinically.   -Patient was discharged on 5 more days of oral Diflucan and nystatin swish and swallow.   -Outpatient follow-up.    3.  Hyponatremia/dehydration -Patient clinically dehydrated on examination secondary to poor oral intake likely secondary to oral thrush. -Patient hydrated with IV fluids.   -Patient maintained on supportive care.  4.  Stage IV right lung adenocarcinoma with pulmonary and osseous mets -Currently on active treatment with nivolumab status post cycle 3 on 09/23/2020. -Oncology consulted and following. -Palliative care also consulted for goals of care. -Patient will follow-up with oncology in the outpatient setting however patient will be discharged home with palliative care following.  5.  Nausea/emesis -Improved on IV antiemetics and antifungals..   -Patient was placed on a diet and diet advanced to a soft diet which she tolerated.  Patient had some occasional bouts of nausea relieved by antiemetics.   Procedures:  Ultrasound-guided thoracentesis-800 cc removed per IR, Dr. Kathlene Cote 10/17/2020  CT angiogram chest 10/16/2020  Chest x-ray 10/17/2020, 10/16/2020    Consultations:  Palliative care: Dr. Rowe Pavy 10/20/2020  Oncology: Dr. Lorna Few    Discharge Exam: Vitals:   10/19/20 2130 10/20/20 0505  BP: (!) 170/89 (!) 157/82  Pulse: (!) 106 (!) 104  Resp: 16 16  Temp: 98.3 F (36.8 C) 98.3 F (36.8 C)  SpO2: 97% 100%    General:  Frail.  No acute distress. Cardiovascular: Tachycardia.  No murmurs rubs or gallops. Respiratory: Decreased breath sounds in the bases.  Fair air movement.  No wheezing noted.  Speaking in full sentences.  Discharge Instructions   Discharge Instructions    Diet general   Complete by: As directed    Discharge wound care:   Complete by: As directed    As above   Increase activity slowly   Complete by: As directed      Allergies as of 10/20/2020   No Known Allergies     Medication List    STOP taking these medications   dronabinol 2.5 MG capsule Commonly known as: MARINOL   fluconazole 100 MG tablet Commonly known as: DIFLUCAN Replaced by: fluconazole 40 MG/ML suspension   methylPREDNISolone 4 MG Tbpk tablet Commonly known as: MEDROL DOSEPAK   ondansetron 4 MG disintegrating tablet Commonly known as: Zofran ODT     TAKE these medications   acetaminophen 500 MG tablet Commonly known as: TYLENOL Take 1,000 mg by mouth every 6 (six) hours as needed for moderate pain or mild pain.   amLODipine 2.5 MG tablet Commonly known as: NORVASC Take 1 tablet (2.5 mg total) by mouth daily.   cyclobenzaprine 10 MG tablet Commonly known as: FLEXERIL Take 1 tablet (10 mg total) by mouth 2 (two) times daily as needed for muscle spasms.   Dry Eye Relief Drops 0.2-0.2-1 % Soln Generic drug: Glycerin-Hypromellose-PEG 400 Place 1 drop into both eyes  2 (two) times daily.   fluconazole 40 MG/ML suspension Commonly known as: DIFLUCAN Take 5 mLs (200 mg total) by mouth daily for 5 days. Start taking on: Oct 21, 2020 Replaces: fluconazole 100 MG tablet   hydrOXYzine 25 MG tablet Commonly known as: ATARAX/VISTARIL Take 2 tablets (50 mg total) by mouth every 8 (eight) hours as needed for anxiety, nausea or vomiting (before bed for sleep).   IRON PO Take 352 mg by mouth daily.   lidocaine 5 % Commonly known as: LIDODERM Place 1 patch onto the skin daily. Remove & Discard patch  within 12 hours or as directed by MD Start taking on: Oct 21, 2020   lidocaine-prilocaine cream Commonly known as: EMLA Apply 1 application topically daily as needed (every four weeks).   lip balm ointment Apply topically as needed for lip care.   nystatin 100000 UNIT/ML suspension Commonly known as: MYCOSTATIN Take 5 mLs (500,000 Units total) by mouth 4 (four) times daily for 5 days.   Omega-3 1000 MG Caps Take 1,000 mg by mouth daily as needed (sore throat).   pantoprazole 40 MG tablet Commonly known as: PROTONIX Take 1 tablet (40 mg total) by mouth daily at 6 (six) AM. Start taking on: Oct 21, 2020 What changed:   medication strength  when to take this   prochlorperazine 10 MG tablet Commonly known as: COMPAZINE TAKE 1 TABLET BY MOUTH EVERY 6 HOURS AS NEEDED FOR NAUSEA OR VOMITING. What changed:   when to take this  reasons to take this   senna-docusate 8.6-50 MG tablet Commonly known as: Senokot-S Take 1 tablet by mouth 2 (two) times daily.   traMADol 50 MG tablet Commonly known as: ULTRAM Take 1 tablet (50 mg total) by mouth every 6 (six) hours as needed for moderate pain or severe pain.   VITAMIN D3 PO Take 1,200 mg by mouth daily.            Durable Medical Equipment  (From admission, onward)         Start     Ordered   10/20/20 1116  For home use only DME high strength lightweight manual wheelchair with seat cushion  Once       Comments: Patient suffers from stage IV metastatic lung cancer which impairs their ability to perform daily activities like in the home.  A walking aid will not resolve  issue with performing activities of daily living. A wheelchair will allow patient to safely perform daily activities.Length of need lifetime. Accessories: elevating leg rests (ELRs), wheel locks, extensions and anti-tippers.   10/20/20 1116   10/20/20 0832  For home use only DME 3 n 1  Once        10/20/20 0831   10/19/20 1505  For home use only DME  oxygen  Once       Question Answer Comment  Length of Need Lifetime   Mode or (Route) Nasal cannula   Liters per Minute 2   Frequency Continuous (stationary and portable oxygen unit needed)   Oxygen conserving device Yes   Oxygen delivery system Gas      10/19/20 1504           Discharge Care Instructions  (From admission, onward)         Start     Ordered   10/20/20 0000  Discharge wound care:       Comments: As above   10/20/20 1308         No Known  Allergies  Follow-up Information    [facility] Follow up.   Specialty: Home Health Services Why: PT, OT, RN, and SW Contact information: [place] [id number] [phone]        [facility] Follow up.   Why: Outpatient palliative Contact information: [place]       Curt Bears, MD Follow up.   Specialty: Oncology Why: Follow-up as scheduled this week. Contact information: [place] [address] [phone]        Colon Branch, MD. Schedule an appointment as soon as possible for a visit in 2 week(s).   Specialty: Internal Medicine Why: Follow-up in 2 to 3 weeks. Contact information: [address] [facility] [address] [place] [address] [phone]                The results of significant diagnostics from this hospitalization (including imaging, microbiology, ancillary and laboratory) are listed below for reference.    Significant Diagnostic Studies: DG Chest 1 View  Result Date: 10/17/2020 CLINICAL DATA:  Post left thoracentesis EXAM: CHEST  1 VIEW COMPARISON:  10/16/2020 FINDINGS: Right Port-A-Cath and right PleurX catheter remain in place, unchanged. Decreasing left pleural effusion following thoracentesis. No pneumothorax. Small right effusion is stable. Heart is mildly enlarged. Bilateral lower lobe airspace opacities, improving on the left  following thoracentesis. IMPRESSION: Decreasing left pleural effusion following thoracentesis. No pneumothorax. Bilateral lower lobe airspace opacities, improving on the left since prior study. Electronically Signed   By: Rolm Baptise M.D.   On: 10/17/2020 10:20   DG Chest 1 View  Result Date: 09/22/2020 CLINICAL DATA:  Post thoracentesis on the left. EXAM: CHEST  1 VIEW COMPARISON:  Radiographs and CT 09/09/2020. FINDINGS: 1523 hours. The left pleural effusion has mildly decreased in volume. The right pleural effusion appears unchanged. Right pleural drainage catheter and right IJ Port-A-Cath remain in place. The heart size and mediastinal contours are stable. Bibasilar atelectasis and multiple pulmonary nodules are again noted. No pneumothorax. IMPRESSION: Decreased left pleural effusion and no pneumothorax following thoracentesis. Known bilateral pulmonary metastases are again seen. Electronically Signed   By: Richardean Sale M.D.   On: 09/22/2020 15:33   CT Angio Chest PE W/Cm &/Or Wo Cm  Result Date: 10/16/2020 CLINICAL DATA:  Shortness of breath.  Stage IV lung cancer. EXAM: CT ANGIOGRAPHY CHEST WITH CONTRAST TECHNIQUE: Multidetector CT imaging of the chest was performed using the standard protocol during bolus administration of intravenous contrast. Multiplanar CT image reconstructions and MIPs were obtained to evaluate the vascular anatomy. CONTRAST:  140m OMNIPAQUE IOHEXOL 350 MG/ML SOLN COMPARISON:  Six 6 09/09/2020. Portable chest obtained earlier today. FINDINGS: Cardiovascular: Satisfactory opacification of the pulmonary arteries to the segmental level. No evidence of pulmonary embolism. Normal heart size. No pericardial effusion. Mediastinum/Nodes: No enlarged mediastinal, hilar, or axillary lymph nodes. Thyroid gland, trachea, and esophagus demonstrate no significant findings. Lungs/Pleura: Moderate right pleural effusion with a mild increase in size. Stable moderate to large-sized left  pleural effusion. A right pleural catheter remains in place. No pneumothorax. No significant change in multiple bilateral lung nodules and masses. The lung bases are not included in their entirety. Upper Abdomen: The minimal portion of the included liver is unremarkable. The remainder of the upper abdomen is not included. Musculoskeletal: Extensive sclerotic bone lesions have not changed significantly. Review of the MIP images confirms the above findings. IMPRESSION: 1. No pulmonary emboli.  2. Mild increase in size of a right pleural effusion and stable left pleural effusion. 3. No significant change in multiple bilateral lung masses and nodules. 4. Stable extensive sclerotic bone metastases. Electronically Signed   By: Claudie Revering M.D.   On: 10/16/2020 17:49   DG Chest Port 1 View  Result Date: 10/16/2020 CLINICAL DATA:  Shortness of breath.  Stage IV lung cancer. EXAM: PORTABLE CHEST 1 VIEW COMPARISON:  09/22/2020. FINDINGS: Normal sized heart with an interval decrease in size. No significant change in bilateral pleural effusions and bilateral lung nodules. Mildly increased bibasilar atelectasis. Stable thoracolumbar scoliosis. IMPRESSION: 1. No acute abnormality. 2. Mildly increased bibasilar atelectasis. 3. Stable bilateral pleural effusions and bilateral lung nodules compatible with pulmonary metastases. Electronically Signed   By: Claudie Revering M.D.   On: 10/16/2020 15:05   US THORACENTESIS ASP PLEURAL SPACE W/IMG GUIDE  Result Date: 10/17/2020 INDICATION: Stage IV right lung adenocarcinoma with pulmonary and osseous metastases and malignant bilateral pleural effusions. Request for therapeutic thoracentesis. EXAM: ULTRASOUND GUIDED LEFT THORACENTESIS MEDICATIONS: 1% lidocaine 8 mL COMPLICATIONS: None immediate. PROCEDURE: An ultrasound guided thoracentesis was thoroughly discussed with the patient and questions answered. The benefits, risks, alternatives and complications were also discussed. The  patient understands and wishes to proceed with the procedure. Written consent was obtained. Ultrasound was performed to localize and mark an adequate pocket of fluid in the left chest. The area was then prepped and draped in the normal sterile fashion. 1% Lidocaine was used for local anesthesia. Under ultrasound guidance a 6 Fr Safe-T-Centesis catheter was introduced. Thoracentesis was performed. The catheter was removed and a dressing applied. FINDINGS: A total of approximately 800 mL of red fluid was removed. Limited ultrasound of the right chest showed that the right effusion is loculated. IMPRESSION: Successful ultrasound guided left thoracentesis yielding 800 mL of pleural fluid. No pneumothorax on post-procedure chest x-ray. Read by: Gareth Eagle, PA-C Electronically Signed   By: Aletta Edouard M.D.   On: 10/17/2020 10:28   US Thoracentesis Asp Pleural space w/IMG guide  Result Date: 09/22/2020 INDICATION: Recurrent pleural effusion due to stage IV lung cancer. Request for therapeutic thoracentesis. EXAM: ULTRASOUND GUIDED LEFT THORACENTESIS MEDICATIONS: 10 mL 1% lidocaine COMPLICATIONS: None immediate.  No pneumothorax on follow-up chest radiograph. PROCEDURE: An ultrasound guided thoracentesis was thoroughly discussed with the patient and questions answered. The benefits, risks, alternatives and complications were also discussed. The patient understands and wishes to proceed with the procedure. Written consent was obtained. Ultrasound was performed to localize and mark an adequate pocket of fluid in the left chest. The area was then prepped and draped in the normal sterile fashion. 1% Lidocaine was used for local anesthesia. Under ultrasound guidance a 6 Fr Safe-T-Centesis catheter was introduced. Thoracentesis was performed. The catheter was removed and a dressing applied. FINDINGS: A total of approximately 800 cc of clear, dark brown fluid was removed. Due to patient chest discomfort only the above  amount of fluid was removed today. Post procedure chest X-ray reviewed, negative for pneumothorax. IMPRESSION: Successful ultrasound guided left thoracentesis yielding 800 cc of pleural fluid. Read by: Durenda Guthrie, PA-C Electronically Signed   By: Lucrezia Europe M.D.   On: 09/22/2020 15:39    Microbiology: Recent Results (from the past 240 hour(s))  SARS CORONAVIRUS 2 (TAT 6-24 HRS) Nasopharyngeal Nasopharyngeal Swab     Status: None   Collection Time: 10/16/20  7:25 PM   Specimen: Nasopharyngeal Swab  Result Value Ref Range Status   SARS  Coronavirus 2 NEGATIVE NEGATIVE Final    Comment: (NOTE) SARS-CoV-2 target nucleic acids are NOT DETECTED.  The SARS-CoV-2 RNA is generally detectable in upper and lower respiratory specimens during the acute phase of infection. Negative results do not preclude SARS-CoV-2 infection, do not rule out co-infections with other pathogens, and should not be used as the sole basis for treatment or other patient management decisions. Negative results must be combined with clinical observations, patient history, and epidemiological information. The expected result is Negative.  Fact Sheet for Patients: SugarRoll.be  Fact Sheet for Healthcare Providers: https://www.woods-mathews.com/  This test is not yet approved or cleared by the [place] FDA and  has been authorized for detection and/or diagnosis of SARS-CoV-2 by FDA under an Emergency Use Authorization (EUA). This EUA will remain  in effect (meaning this test can be used) for the duration of the COVID-19 declaration under Se ction 564(b)(1) of the Act, 21 U.S.C. section 360bbb-3(b)(1), unless the authorization is terminated or revoked sooner.  Performed at [facility], [place] [address], [place], [place] [address]      Labs: Basic Metabolic Panel: Recent Labs  Lab 10/16/20 1352 10/17/20 0333 10/18/20 0321 10/19/20 0750  NA 130* 132* 136 132*  K  4.7 4.9 5.1 4.6  CL 90* 95* 96* 95*  CO2 31 33* 31 33*  GLUCOSE 105* 98 87 103*  BUN '12 12 17 10  ' CREATININE 0.77 0.83 0.75 0.56  CALCIUM 9.3 8.6* 9.1 8.7*   Liver Function Tests: Recent Labs  Lab 10/16/20 1352  AST 31  ALT 15  ALKPHOS 311*  BILITOT 0.9  PROT 7.6  ALBUMIN 3.2*   No results for input(s): LIPASE, AMYLASE in the last 168 hours. No results for input(s): AMMONIA in the last 168 hours. CBC: Recent Labs  Lab 10/16/20 1352 10/17/20 0333 10/18/20 0321 10/19/20 0750  WBC 7.1 5.5 7.4 7.6  NEUTROABS 6.0  --  5.3  --   HGB 11.9* 10.1* 10.7* 10.4*  HCT 38.0 32.8* 35.5* 34.0*  MCV 84.8 86.3 89.0 88.3  PLT 335 271 317 287   Cardiac Enzymes: No results for input(s): CKTOTAL, CKMB, CKMBINDEX, TROPONINI in the last 168 hours. BNP: BNP (last 3 results) Recent Labs    09/09/20 1736 10/16/20 1352  BNP 22.9 38.3    ProBNP (last 3 results) No results for input(s): PROBNP in the last 8760 hours.  CBG: No results for input(s): GLUCAP in the last 168 hours.     Signed:  Irine Seal MD.  [facility] 10/20/2020, 1:19 PM

## 2020-10-20 NOTE — Telephone Encounter (Signed)
Stacey with [facility] called and stated pt was going to be discharged from [facility] today and wanted to see if PCP would be agreeable to oversee palliative care.  Erline Levine was made aware PCP is out of country until afternoon of 10/28/20.  Erline Levine said she would look to see if a specialist is assigned to pt and will contact him or her if so and if not will call office back upon PCP's return next week.

## 2020-10-20 NOTE — Care Management Important Message (Signed)
Important Message  Patient Details IM Letter given to the Patient. Name: Valerie Long MRN: [id number] Date of Birth: Jan 01, 1946   Medicare Important Message Given:  Yes     Kerin Salen 10/20/2020, 11:49 AM

## 2020-10-20 NOTE — Progress Notes (Signed)
Patient discharged to home w/ family. Given all belongings, instructions, prescriptions, equipment. Verbalized understanding of all instructions. Escorted to pov via w/c.

## 2020-10-21 ENCOUNTER — Telehealth: Payer: Self-pay | Admitting: Medical Oncology

## 2020-10-21 ENCOUNTER — Telehealth: Payer: Self-pay

## 2020-10-21 NOTE — Telephone Encounter (Signed)
Spoke with patient and scheduled an in-person Palliative Consult for 10/31/20 @ 10:30AM  COVID screening was negative. No pets in home. Patient lives with mother & sister.  Consent obtained; updated Outlook/Netsmart/Team List and Epic.   Family is aware they may be receiving a call from NP the day before or day of to confirm appointment.

## 2020-10-21 NOTE — Telephone Encounter (Signed)
Sister cancelled appt for this week and wants to r/s. appts cancelled. Schedule message sent

## 2020-10-21 NOTE — Telephone Encounter (Signed)
Transition Care Management Unsuccessful Follow-up Telephone Call  Date of discharge and from where:  10/20/2020-[facility]  Attempts:  1st Attempt  Reason for unsuccessful TCM follow-up call:  No answer/busy

## 2020-10-22 ENCOUNTER — Ambulatory Visit: Payer: Medicare Other | Admitting: Internal Medicine

## 2020-10-22 ENCOUNTER — Telehealth: Payer: Self-pay | Admitting: Internal Medicine

## 2020-10-22 ENCOUNTER — Other Ambulatory Visit: Payer: Medicare Other

## 2020-10-22 ENCOUNTER — Ambulatory Visit: Payer: Medicare Other

## 2020-10-22 ENCOUNTER — Encounter: Payer: Medicare Other | Admitting: Dietician

## 2020-10-22 DIAGNOSIS — D630 Anemia in neoplastic disease: Secondary | ICD-10-CM | POA: Diagnosis not present

## 2020-10-22 DIAGNOSIS — J910 Malignant pleural effusion: Secondary | ICD-10-CM | POA: Diagnosis not present

## 2020-10-22 DIAGNOSIS — Z4803 Encounter for change or removal of drains: Secondary | ICD-10-CM | POA: Diagnosis not present

## 2020-10-22 DIAGNOSIS — C7951 Secondary malignant neoplasm of bone: Secondary | ICD-10-CM | POA: Diagnosis not present

## 2020-10-22 DIAGNOSIS — Z48813 Encounter for surgical aftercare following surgery on the respiratory system: Secondary | ICD-10-CM | POA: Diagnosis not present

## 2020-10-22 DIAGNOSIS — C3491 Malignant neoplasm of unspecified part of right bronchus or lung: Secondary | ICD-10-CM | POA: Diagnosis not present

## 2020-10-22 NOTE — Telephone Encounter (Signed)
Transition Care Management Unsuccessful Follow-up Telephone Call  Date of discharge and from where:  10/20/20-[facility]  Attempts:  3rd Attempt  Reason for unsuccessful TCM follow-up call:  No answer/busy

## 2020-10-22 NOTE — Telephone Encounter (Signed)
Scheduled appts per 5/17 sch msg. Pt aware.

## 2020-10-23 DIAGNOSIS — D630 Anemia in neoplastic disease: Secondary | ICD-10-CM | POA: Diagnosis not present

## 2020-10-23 DIAGNOSIS — J910 Malignant pleural effusion: Secondary | ICD-10-CM | POA: Diagnosis not present

## 2020-10-23 DIAGNOSIS — Z4803 Encounter for change or removal of drains: Secondary | ICD-10-CM | POA: Diagnosis not present

## 2020-10-23 DIAGNOSIS — C3491 Malignant neoplasm of unspecified part of right bronchus or lung: Secondary | ICD-10-CM | POA: Diagnosis not present

## 2020-10-23 DIAGNOSIS — Z48813 Encounter for surgical aftercare following surgery on the respiratory system: Secondary | ICD-10-CM | POA: Diagnosis not present

## 2020-10-23 DIAGNOSIS — C7951 Secondary malignant neoplasm of bone: Secondary | ICD-10-CM | POA: Diagnosis not present

## 2020-10-24 ENCOUNTER — Telehealth: Payer: Self-pay | Admitting: Internal Medicine

## 2020-10-24 DIAGNOSIS — C3491 Malignant neoplasm of unspecified part of right bronchus or lung: Secondary | ICD-10-CM | POA: Diagnosis not present

## 2020-10-24 DIAGNOSIS — J910 Malignant pleural effusion: Secondary | ICD-10-CM | POA: Diagnosis not present

## 2020-10-24 DIAGNOSIS — Z4803 Encounter for change or removal of drains: Secondary | ICD-10-CM | POA: Diagnosis not present

## 2020-10-24 DIAGNOSIS — Z48813 Encounter for surgical aftercare following surgery on the respiratory system: Secondary | ICD-10-CM | POA: Diagnosis not present

## 2020-10-24 DIAGNOSIS — D630 Anemia in neoplastic disease: Secondary | ICD-10-CM | POA: Diagnosis not present

## 2020-10-24 DIAGNOSIS — C7951 Secondary malignant neoplasm of bone: Secondary | ICD-10-CM | POA: Diagnosis not present

## 2020-10-24 NOTE — Telephone Encounter (Signed)
Caller Donal Johnson  Caller [phone]  OT 1 x a week for 1 week 0 x a week for 1 week 1 x a week for 2 weeks 0 x a week for 1 week 1 x a week for 2 week ADL Tranfer ,Exercise Pt reported she vomited 2 hrs age after drink water and taking a gummy probiotics it's not prescribe pt, was advice not to take any supplements without doctors approval  Please advice

## 2020-10-27 DIAGNOSIS — C7951 Secondary malignant neoplasm of bone: Secondary | ICD-10-CM | POA: Diagnosis not present

## 2020-10-27 DIAGNOSIS — Z48813 Encounter for surgical aftercare following surgery on the respiratory system: Secondary | ICD-10-CM | POA: Diagnosis not present

## 2020-10-27 DIAGNOSIS — Z4803 Encounter for change or removal of drains: Secondary | ICD-10-CM | POA: Diagnosis not present

## 2020-10-27 DIAGNOSIS — D630 Anemia in neoplastic disease: Secondary | ICD-10-CM | POA: Diagnosis not present

## 2020-10-27 DIAGNOSIS — J910 Malignant pleural effusion: Secondary | ICD-10-CM | POA: Diagnosis not present

## 2020-10-27 DIAGNOSIS — C3491 Malignant neoplasm of unspecified part of right bronchus or lung: Secondary | ICD-10-CM | POA: Diagnosis not present

## 2020-10-27 NOTE — Telephone Encounter (Signed)
Called Donal and left Vm to return call back to office. -JMA

## 2020-10-28 ENCOUNTER — Telehealth: Payer: Self-pay

## 2020-10-28 ENCOUNTER — Ambulatory Visit
Admission: RE | Admit: 2020-10-28 | Discharge: 2020-10-28 | Disposition: A | Discharge status: Home / Self Care | Payer: Medicare Other | Source: Ambulatory Visit | Attending: Thoracic Surgery (Cardiothoracic Vascular Surgery) | Admitting: Thoracic Surgery (Cardiothoracic Vascular Surgery)

## 2020-10-28 ENCOUNTER — Encounter: Payer: Self-pay | Admitting: Thoracic Surgery (Cardiothoracic Vascular Surgery)

## 2020-10-28 ENCOUNTER — Other Ambulatory Visit: Payer: Self-pay

## 2020-10-28 ENCOUNTER — Ambulatory Visit (INDEPENDENT_AMBULATORY_CARE_PROVIDER_SITE_OTHER): Payer: Medicare Other | Admitting: Thoracic Surgery (Cardiothoracic Vascular Surgery)

## 2020-10-28 VITALS — BP 157/96 | HR 122 | Resp 22

## 2020-10-28 DIAGNOSIS — C3491 Malignant neoplasm of unspecified part of right bronchus or lung: Secondary | ICD-10-CM | POA: Diagnosis not present

## 2020-10-28 DIAGNOSIS — J90 Pleural effusion, not elsewhere classified: Secondary | ICD-10-CM

## 2020-10-28 DIAGNOSIS — Z452 Encounter for adjustment and management of vascular access device: Secondary | ICD-10-CM | POA: Diagnosis not present

## 2020-10-28 DIAGNOSIS — R918 Other nonspecific abnormal finding of lung field: Secondary | ICD-10-CM | POA: Diagnosis not present

## 2020-10-28 NOTE — Progress Notes (Signed)
[address]       [place] [address]             [phone]     HPI: Valerie Long returns for a scheduled follow-up visit regarding her malignant pleural effusion.  Valerie Long is a 75 year old woman with stage IV lung cancer with adenopathy, bone metastases, and a malignant right pleural effusion.  She had a pleural catheter placed on 08/08/2020.  Over time she had a decreasing volume of drainage.  She did have some symptomatic relief early on but is have less and less more recently.    I saw her in the office on 09/09/2020.  She was draining about 332mL every other day.  Despite that she was having worsening shortness of breath.  A CT of the chest was done which showed some persistent fluid in the right base with a catheter in good position.  There was effusion on the left side.  There was some progression of her pulmonary nodules.  She had a thoracentesis on the left side but that really did not help her breathing much.  She says the last time she tried to drain the catheter there was no output.  She says that she feels short of breath all the time.  Past Medical History:  Diagnosis Date  . Adenocarcinoma of right lung, stage 4 ([facility]) 2017   lump nodes  . Anemia   . Arthritis   . Bone metastases ([place]) 02/24/2017  . Encounter for antineoplastic chemotherapy 03/17/2016  . Hypercholesterolemia   . Hypertension 06/18/2016   just when I go to the hospital   . Osteopenia   . 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 Pneumonia   . Shortness of breath dyspnea   . 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 acetaminophen (TYLENOL) 500 MG tablet Take 1,000 mg by mouth every 6 (six) hours as needed for moderate pain or mild pain.    Marland Kitchen amLODipine (NORVASC) 2.5 MG tablet Take 1 tablet (2.5 mg total) by mouth daily. 30 tablet 6  . Cholecalciferol (VITAMIN D3 PO) Take 1,200 mg by mouth daily.    .  cyclobenzaprine (FLEXERIL) 10 MG tablet Take 1 tablet (10 mg total) by mouth 2 (two) times daily as needed for muscle spasms. 20 tablet 0  . Ferrous Sulfate (IRON PO) Take 352 mg by mouth daily.    . Glycerin-Hypromellose-PEG 400 (DRY EYE RELIEF DROPS) 0.2-0.2-1 % SOLN Place 1 drop into both eyes 2 (two) times daily.    . hydrOXYzine (ATARAX/VISTARIL) 25 MG tablet Take 2 tablets (50 mg total) by mouth every 8 (eight) hours as needed for anxiety, nausea or vomiting (before bed for sleep). 12 tablet 0  . lidocaine (LIDODERM) 5 % Place 1 patch onto the skin daily. Remove & Discard patch within 12 hours or as directed by MD 15 patch 0  . lidocaine-prilocaine (EMLA) cream Apply 1 application topically daily as needed (every four weeks).    Marland Kitchen lip balm (CARMEX) ointment Apply topically as needed for lip care. 7 g 0  . Omega-3 1000 MG CAPS Take 1,000 mg by mouth daily as needed (sore throat).    . pantoprazole (PROTONIX) 40 MG tablet Take 1 tablet (40 mg total) by mouth daily at 6 (six) AM. 30 tablet 1  . prochlorperazine (COMPAZINE) 10 MG tablet TAKE 1 TABLET BY  MOUTH EVERY 6 HOURS AS NEEDED FOR NAUSEA OR VOMITING. (Patient taking differently: Take 10 mg by mouth every 8 (eight) hours as needed for nausea or vomiting.) 30 tablet 0  . senna-docusate (SENOKOT-S) 8.6-50 MG tablet Take 1 tablet by mouth 2 (two) times daily.    . traMADol (ULTRAM) 50 MG tablet Take 1 tablet (50 mg total) by mouth every 6 (six) hours as needed for moderate pain or severe pain. 28 tablet 0   No current facility-administered medications for this visit.    Physical Exam BP (!) 157/96 (BP Location: Left Arm, Patient Position: Sitting, Cuff Size: Small)   Pulse (!) 122   Resp (!) 22   LMP 06/09/1998   SpO2 98% Comment: 2LNC Ill-appearing 75 year old woman in no acute distress Alert and oriented x3 Lungs diminished at both bases right greater than left Cardiac tachycardic, regular Trace peripheral edema  Diagnostic  Tests: I personally reviewed the chest x-ray images.  They showed no change in a small right effusion and minimal left effusion.  Multiple pulmonary metastases.  Impression: Valerie Long is a 75 year old woman with stage IV lung cancer with a malignant right pleural effusion.  She had a pleural catheter placed about 2-1/2 months ago.  Initially she was having some regular drainage from the catheter and some mild relief although her relief was never dramatic.  Now the catheter is having minimal drainage and the last time she tried there was no drainage whatsoever.  I flushed the catheter with sterile saline in the office there was some resistance but eventually the fluid did go through.  However I was unable to draw back any fluid through the catheter.  I suspect she has fibrinous material occluding the catheter.  I am not sure there is enough fluid that even if the catheter can drain at all it would make a huge difference symptomatically but it might provide some relief.  I offered her the option of attempting thrombolytics through the catheter to try and get it functioning again.  She is not sure she wants to do that.  She is aware that we would plan to do that on an outpatient basis in the short stay area.  There is a risk of bleeding with the procedure.  She will think over her options and let us know what she wants to do.  If she wants to schedule thrombolytics we will arrange that in the next few days.  If not I will plan to see her back in a week and we can discuss removing the catheter altogether.  I spent over 10 minutes in review of records, images, and in consultation with Mr. Yott today.  Melrose Nakayama, MD [facility] [phone]

## 2020-10-28 NOTE — Telephone Encounter (Signed)
Agree, stop supplements if that makes her nauseous. Okay to proceed with OT.

## 2020-10-28 NOTE — Telephone Encounter (Signed)
Plan of care signed and faxed to [facility] at [phone]. Form sent for scanning.

## 2020-10-28 NOTE — Telephone Encounter (Signed)
Called Donal from [facility] and left detailed VM for verbal orders. -JMA

## 2020-10-29 ENCOUNTER — Inpatient Hospital Stay: Payer: Medicare Other

## 2020-10-29 ENCOUNTER — Other Ambulatory Visit: Payer: Self-pay | Admitting: *Deleted

## 2020-10-29 ENCOUNTER — Inpatient Hospital Stay: Payer: Medicare Other | Admitting: Physician Assistant

## 2020-10-29 ENCOUNTER — Other Ambulatory Visit: Payer: Self-pay | Admitting: Thoracic Surgery (Cardiothoracic Vascular Surgery)

## 2020-10-29 DIAGNOSIS — C3491 Malignant neoplasm of unspecified part of right bronchus or lung: Secondary | ICD-10-CM

## 2020-10-29 DIAGNOSIS — J90 Pleural effusion, not elsewhere classified: Secondary | ICD-10-CM

## 2020-10-30 ENCOUNTER — Telehealth: Payer: Self-pay | Admitting: Medical Oncology

## 2020-10-30 DIAGNOSIS — C7951 Secondary malignant neoplasm of bone: Secondary | ICD-10-CM | POA: Diagnosis not present

## 2020-10-30 DIAGNOSIS — J910 Malignant pleural effusion: Secondary | ICD-10-CM | POA: Diagnosis not present

## 2020-10-30 DIAGNOSIS — D630 Anemia in neoplastic disease: Secondary | ICD-10-CM | POA: Diagnosis not present

## 2020-10-30 DIAGNOSIS — Z48813 Encounter for surgical aftercare following surgery on the respiratory system: Secondary | ICD-10-CM | POA: Diagnosis not present

## 2020-10-30 DIAGNOSIS — Z4803 Encounter for change or removal of drains: Secondary | ICD-10-CM | POA: Diagnosis not present

## 2020-10-30 DIAGNOSIS — C3491 Malignant neoplasm of unspecified part of right bronchus or lung: Secondary | ICD-10-CM | POA: Diagnosis not present

## 2020-10-30 NOTE — Telephone Encounter (Signed)
I called [name] and told her hospice will call [name] or her and make an appt to see her and go over services. She expressed her thanks for the care she received for the cancer center.

## 2020-10-30 NOTE — Telephone Encounter (Signed)
Pt referred to Hospice today.

## 2020-10-30 NOTE — Telephone Encounter (Signed)
Left VM for Langley Gauss that Dr Julien Nordmann will be attending for Hospice.

## 2020-10-31 ENCOUNTER — Encounter (HOSPITAL_COMMUNITY)
Admission: RE | Disposition: A | Discharge status: Home / Self Care | Payer: Self-pay | Source: Home / Self Care | Attending: Thoracic Surgery (Cardiothoracic Vascular Surgery)

## 2020-10-31 ENCOUNTER — Other Ambulatory Visit: Payer: Self-pay

## 2020-10-31 ENCOUNTER — Telehealth: Payer: Self-pay | Admitting: Nurse Practitioner

## 2020-10-31 ENCOUNTER — Other Ambulatory Visit: Payer: Medicare Other | Admitting: Nurse Practitioner

## 2020-10-31 ENCOUNTER — Encounter (HOSPITAL_COMMUNITY): Payer: Self-pay | Admitting: Thoracic Surgery (Cardiothoracic Vascular Surgery)

## 2020-10-31 ENCOUNTER — Ambulatory Visit (HOSPITAL_COMMUNITY)
Admission: RE | Admit: 2020-10-31 | Discharge: 2020-10-31 | Disposition: A | Discharge status: Home / Self Care | Payer: Medicare Other | Attending: Thoracic Surgery (Cardiothoracic Vascular Surgery) | Admitting: Thoracic Surgery (Cardiothoracic Vascular Surgery)

## 2020-10-31 ENCOUNTER — Encounter: Payer: Self-pay | Admitting: Internal Medicine

## 2020-10-31 DIAGNOSIS — C7951 Secondary malignant neoplasm of bone: Secondary | ICD-10-CM | POA: Diagnosis not present

## 2020-10-31 DIAGNOSIS — Z8249 Family history of ischemic heart disease and other diseases of the circulatory system: Secondary | ICD-10-CM | POA: Diagnosis not present

## 2020-10-31 DIAGNOSIS — T859XXA Unspecified complication of internal prosthetic device, implant and graft, initial encounter: Secondary | ICD-10-CM | POA: Diagnosis not present

## 2020-10-31 DIAGNOSIS — Z79899 Other long term (current) drug therapy: Secondary | ICD-10-CM | POA: Insufficient documentation

## 2020-10-31 DIAGNOSIS — Z8049 Family history of malignant neoplasm of other genital organs: Secondary | ICD-10-CM | POA: Diagnosis not present

## 2020-10-31 DIAGNOSIS — Z803 Family history of malignant neoplasm of breast: Secondary | ICD-10-CM | POA: Insufficient documentation

## 2020-10-31 DIAGNOSIS — X58XXXA Exposure to other specified factors, initial encounter: Secondary | ICD-10-CM | POA: Insufficient documentation

## 2020-10-31 DIAGNOSIS — J90 Pleural effusion, not elsewhere classified: Secondary | ICD-10-CM

## 2020-10-31 DIAGNOSIS — Z833 Family history of diabetes mellitus: Secondary | ICD-10-CM | POA: Insufficient documentation

## 2020-10-31 DIAGNOSIS — J910 Malignant pleural effusion: Secondary | ICD-10-CM | POA: Diagnosis not present

## 2020-10-31 DIAGNOSIS — C3490 Malignant neoplasm of unspecified part of unspecified bronchus or lung: Secondary | ICD-10-CM | POA: Insufficient documentation

## 2020-10-31 HISTORY — PX: TALC PLEURODESIS: SHX2506

## 2020-10-31 SURGERY — PLEURODESIS, USING TALC
Anesthesia: Monitor Anesthesia Care | Laterality: Right

## 2020-10-31 MED ORDER — SODIUM CHLORIDE (PF) 0.9 % IJ SOLN
10.0000 mg | Freq: Every day | INTRAMUSCULAR | Status: DC
  Administered 2020-10-31: 10 mg via INTRAPLEURAL
  Filled 2020-10-31: qty 10

## 2020-10-31 MED ORDER — STERILE WATER FOR INJECTION IJ SOLN
5.0000 mg | Freq: Every day | RESPIRATORY_TRACT | Status: DC
  Administered 2020-10-31: 5 mg via INTRAPLEURAL
  Filled 2020-10-31: qty 5

## 2020-10-31 NOTE — H&P (Signed)
[address]            [place] [address]          [phone]     Valerie Long is an 75 y.o. female. [birth year] [id number]   Chief Complaint: She presents to short stay for instillation of thrombolytic via right Pleur X catheter  History of Presenting Illness: This is a 75 year old female with stage IV lung cancer with adenopathy, bone metastases, and a malignant right pleural effusion.  She had a pleural catheter placed on 08/08/2020.  Over time she had a decreasing volume of drainage.  She did have some symptomatic relief early on but is have less and less more recently.   Per Dr. Leonarda Salon note, he is not sure there is enough fluid that even if the catheter can drain at all it would make a huge difference symptomatically but it might provide some relief.  Dr. Roxan Hockey offered her the option of attempting thrombolytics through the catheter to try and get it functioning again. She presents to have this done today.  Past Medical History: Past Medical History:  Diagnosis Date  . Adenocarcinoma of right lung, stage 4 ([facility]) 2017   lump nodes  . Anemia   . Arthritis   . Bone metastases ([facility]) 02/24/2017  . Encounter for antineoplastic chemotherapy 03/17/2016  . Hypercholesterolemia   . Hypertension 06/18/2016   just when I go to the hospital   . Osteopenia   . 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 Pneumonia   . Shortness of breath dyspnea   . 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 CESAREAN SECTION     myomectomy  . CHEST TUBE INSERTION Right 08/08/2020   Procedure: INSERTION PLEURAL DRAINAGE CATHETER;  Surgeon: Melrose Nakayama, MD;  Location: [facility];  Service: Thoracic;  Laterality: Right;  . COLONOSCOPY W/ POLYPECTOMY    . IR GENERIC HISTORICAL  08/17/2016   IR US GUIDE VASC ACCESS RIGHT 08/17/2016 [name], MD [facility]-INTERV RAD  . IR  GENERIC HISTORICAL  08/17/2016   IR FLUORO GUIDE PORT INSERTION RIGHT 08/17/2016 [name], MD [facility]  . MEDIASTINOSCOPY N/A 09/25/2015   Procedure: MEDIASTINOSCOPY;  Surgeon: Melrose Nakayama, MD;  Location: [facility];  Service: Thoracic;  Laterality: N/A;  . VIDEO BRONCHOSCOPY Bilateral 08/19/2015   Procedure: VIDEO BRONCHOSCOPY WITH FLUORO;  Surgeon: Rigoberto Noel, MD;  Location: [facility];  Service: Cardiopulmonary;  Laterality: Bilateral;  . VIDEO BRONCHOSCOPY N/A 09/08/2015   Procedure: VIDEO BRONCHOSCOPY WITH FLUORO;  Surgeon: Rigoberto Noel, MD;  Location: [facility] OR;  Service: Thoracic;  Laterality: N/A;  . VIDEO BRONCHOSCOPY WITH ENDOBRONCHIAL ULTRASOUND Right 09/08/2015   Procedure: ATTEMPTED VIDEO BRONCHOSCOPY ENDOBRONCHIAL ULTRASOUND  ;  Surgeon: Rigoberto Noel, MD;  Location: [facility] OR;  Service: Thoracic;  Laterality: Right;    Family History: Family History  Problem Relation Age of Onset  . Hypertension Mother        F and M  . CAD Father   . Diabetes Other        auncle-aunts   . Cancer Other        UTERINE   . Breast cancer Sister   . Stroke Neg Hx   . Colon cancer Neg Hx  Social History:   reports that she has never smoked. She has never used smokeless tobacco. She reports previous alcohol use. She reports that she does not use drugs.  Allergies:  No Known Allergies  Medications Prior to Admission  Medication Sig Dispense Refill  . acetaminophen (TYLENOL) 500 MG tablet Take 1,000 mg by mouth at bedtime.    . Cholecalciferol (VITAMIN D3 PO) Take 1,200 mg by mouth daily. 600 mg each    . Glycerin-Hypromellose-PEG 400 (DRY EYE RELIEF DROPS) 0.2-0.2-1 % SOLN Place 1 drop into both eyes 2 (two) times daily.    Marland Kitchen lidocaine (LIDODERM) 5 % Place 1 patch onto the skin daily. Remove & Discard patch within 12 hours or as directed by MD 15 patch 0  . lidocaine-prilocaine (EMLA) cream Apply 1 application topically daily as needed (every four weeks).    Marland Kitchen lip balm  (CARMEX) ointment Apply topically as needed for lip care. (Patient taking differently: Apply 1 application topically as needed for lip care.) 7 g 0  . prochlorperazine (COMPAZINE) 10 MG tablet TAKE 1 TABLET BY MOUTH EVERY 6 HOURS AS NEEDED FOR NAUSEA OR VOMITING. (Patient taking differently: Take 10 mg by mouth every 8 (eight) hours as needed for nausea or vomiting.) 30 tablet 0  . amLODipine (NORVASC) 2.5 MG tablet Take 1 tablet (2.5 mg total) by mouth daily. (Patient not taking: No sig reported) 30 tablet 6  . cyclobenzaprine (FLEXERIL) 10 MG tablet Take 1 tablet (10 mg total) by mouth 2 (two) times daily as needed for muscle spasms. (Patient not taking: No sig reported) 20 tablet 0  . hydrOXYzine (ATARAX/VISTARIL) 25 MG tablet Take 2 tablets (50 mg total) by mouth every 8 (eight) hours as needed for anxiety, nausea or vomiting (before bed for sleep). (Patient not taking: No sig reported) 12 tablet 0  . pantoprazole (PROTONIX) 40 MG tablet Take 1 tablet (40 mg total) by mouth daily at 6 (six) AM. (Patient not taking: No sig reported) 30 tablet 1  . senna-docusate (SENOKOT-S) 8.6-50 MG tablet Take 1 tablet by mouth 2 (two) times daily. (Patient not taking: No sig reported)    . traMADol (ULTRAM) 50 MG tablet Take 1 tablet (50 mg total) by mouth every 6 (six) hours as needed for moderate pain or severe pain. (Patient not taking: No sig reported) 28 tablet 0    BP (!) 161/92   Pulse (!) 107   Temp 97.6 F (36.4 C) (Oral)   Resp 18   Ht 5\' 4"  (1.626 m)   Wt 54.4 kg   LMP 06/09/1998   SpO2 100%   BMI 20.60 kg/m   Physical Exam: Alert and oriented x3 CV- Tachycardic and regular Pulmonary-Diminished bibasilar breath sounds R>L  Diagnostic Studies and Laboratory Results: No results found for this or any previous visit (from the past 48 hour(s)). No results found.   Assessment/Plan Patient with stage IV lung cancer, adenopathy, bone metastases, and malignant right pleural effusion. She  presents to [facility] short stay for placement of thrombolytics. Patient tolerated the procedure. She will continue to drain right Pleur X as has been. She has a follow up appointment with Dr. Roxan Hockey on Tuesday 05/31.  Nani Skillern, PA-C 10/31/2020, 12:23 PM

## 2020-11-02 ENCOUNTER — Other Ambulatory Visit: Payer: Self-pay | Admitting: Internal Medicine

## 2020-11-03 DIAGNOSIS — H04129 Dry eye syndrome of unspecified lacrimal gland: Secondary | ICD-10-CM | POA: Diagnosis not present

## 2020-11-03 DIAGNOSIS — M8580 Other specified disorders of bone density and structure, unspecified site: Secondary | ICD-10-CM | POA: Diagnosis not present

## 2020-11-03 DIAGNOSIS — M1990 Unspecified osteoarthritis, unspecified site: Secondary | ICD-10-CM | POA: Diagnosis not present

## 2020-11-03 DIAGNOSIS — E785 Hyperlipidemia, unspecified: Secondary | ICD-10-CM | POA: Diagnosis not present

## 2020-11-03 DIAGNOSIS — Z6821 Body mass index (BMI) 21.0-21.9, adult: Secondary | ICD-10-CM | POA: Diagnosis not present

## 2020-11-03 DIAGNOSIS — R7611 Nonspecific reaction to tuberculin skin test without active tuberculosis: Secondary | ICD-10-CM | POA: Diagnosis not present

## 2020-11-03 DIAGNOSIS — D630 Anemia in neoplastic disease: Secondary | ICD-10-CM | POA: Diagnosis not present

## 2020-11-03 DIAGNOSIS — C7951 Secondary malignant neoplasm of bone: Secondary | ICD-10-CM | POA: Diagnosis not present

## 2020-11-03 DIAGNOSIS — I10 Essential (primary) hypertension: Secondary | ICD-10-CM | POA: Diagnosis not present

## 2020-11-03 DIAGNOSIS — C3490 Malignant neoplasm of unspecified part of unspecified bronchus or lung: Secondary | ICD-10-CM | POA: Diagnosis not present

## 2020-11-04 ENCOUNTER — Encounter: Payer: Self-pay | Admitting: Internal Medicine

## 2020-11-04 ENCOUNTER — Telehealth: Payer: Self-pay | Admitting: Internal Medicine

## 2020-11-04 ENCOUNTER — Encounter: Payer: Medicare Other | Admitting: Thoracic Surgery (Cardiothoracic Vascular Surgery)

## 2020-11-04 ENCOUNTER — Encounter (HOSPITAL_COMMUNITY): Payer: Self-pay | Admitting: Thoracic Surgery (Cardiothoracic Vascular Surgery)

## 2020-11-04 DIAGNOSIS — D630 Anemia in neoplastic disease: Secondary | ICD-10-CM | POA: Diagnosis not present

## 2020-11-04 DIAGNOSIS — R7611 Nonspecific reaction to tuberculin skin test without active tuberculosis: Secondary | ICD-10-CM | POA: Diagnosis not present

## 2020-11-04 DIAGNOSIS — E785 Hyperlipidemia, unspecified: Secondary | ICD-10-CM | POA: Diagnosis not present

## 2020-11-04 DIAGNOSIS — C7951 Secondary malignant neoplasm of bone: Secondary | ICD-10-CM | POA: Diagnosis not present

## 2020-11-04 DIAGNOSIS — C3490 Malignant neoplasm of unspecified part of unspecified bronchus or lung: Secondary | ICD-10-CM | POA: Diagnosis not present

## 2020-11-04 DIAGNOSIS — I10 Essential (primary) hypertension: Secondary | ICD-10-CM | POA: Diagnosis not present

## 2020-11-04 NOTE — Telephone Encounter (Signed)
She is moderately tachycardic, she was tachycardic while she was in the hospital, likely multifactorial. Recommend to notify me or go to the ER if: Fever, palpitations, worsening chest pain or difficulty breathing.

## 2020-11-04 NOTE — Telephone Encounter (Signed)
LMOM w/ recommendations. Instructed to call if questions/concerns.

## 2020-11-04 NOTE — Telephone Encounter (Signed)
Vitals Heart-rate  reading  104-107 at rest during visit....&  116 with walking from bedside to commode  11/04/20  142/88 12:24pm 05/31  132/84 12:56  05/31    Okay to leave message

## 2020-11-04 NOTE — Telephone Encounter (Signed)
Please advise 

## 2020-11-05 ENCOUNTER — Other Ambulatory Visit: Payer: Self-pay | Admitting: Medical Oncology

## 2020-11-05 DIAGNOSIS — C3490 Malignant neoplasm of unspecified part of unspecified bronchus or lung: Secondary | ICD-10-CM | POA: Diagnosis not present

## 2020-11-05 DIAGNOSIS — H04129 Dry eye syndrome of unspecified lacrimal gland: Secondary | ICD-10-CM | POA: Diagnosis not present

## 2020-11-05 DIAGNOSIS — R112 Nausea with vomiting, unspecified: Secondary | ICD-10-CM

## 2020-11-05 DIAGNOSIS — M8580 Other specified disorders of bone density and structure, unspecified site: Secondary | ICD-10-CM | POA: Diagnosis not present

## 2020-11-05 DIAGNOSIS — Z6821 Body mass index (BMI) 21.0-21.9, adult: Secondary | ICD-10-CM | POA: Diagnosis not present

## 2020-11-05 DIAGNOSIS — R7611 Nonspecific reaction to tuberculin skin test without active tuberculosis: Secondary | ICD-10-CM | POA: Diagnosis not present

## 2020-11-05 DIAGNOSIS — K5900 Constipation, unspecified: Secondary | ICD-10-CM

## 2020-11-05 DIAGNOSIS — I10 Essential (primary) hypertension: Secondary | ICD-10-CM | POA: Diagnosis not present

## 2020-11-05 DIAGNOSIS — E785 Hyperlipidemia, unspecified: Secondary | ICD-10-CM | POA: Diagnosis not present

## 2020-11-05 DIAGNOSIS — C7951 Secondary malignant neoplasm of bone: Secondary | ICD-10-CM | POA: Diagnosis not present

## 2020-11-05 DIAGNOSIS — M1990 Unspecified osteoarthritis, unspecified site: Secondary | ICD-10-CM | POA: Diagnosis not present

## 2020-11-05 DIAGNOSIS — D630 Anemia in neoplastic disease: Secondary | ICD-10-CM | POA: Diagnosis not present

## 2020-11-05 MED ORDER — SENNOSIDES-DOCUSATE SODIUM 8.6-50 MG PO TABS: 1.0000 | ORAL_TABLET | Freq: Two times a day (BID) | ORAL | Status: AC

## 2020-11-05 MED ORDER — PROCHLORPERAZINE MALEATE 10 MG PO TABS: 10.0000 mg | ORAL_TABLET | Freq: Four times a day (QID) | ORAL | 0 refills | Status: AC | PRN

## 2020-11-05 NOTE — Telephone Encounter (Signed)
Pt started Hospice services on Monday 11/03/20.

## 2020-11-06 ENCOUNTER — Encounter: Payer: Medicare Other | Admitting: Thoracic Surgery (Cardiothoracic Vascular Surgery)

## 2020-11-06 ENCOUNTER — Ambulatory Visit (INDEPENDENT_AMBULATORY_CARE_PROVIDER_SITE_OTHER): Payer: Medicare Other | Admitting: Thoracic Surgery (Cardiothoracic Vascular Surgery)

## 2020-11-06 ENCOUNTER — Other Ambulatory Visit: Payer: Self-pay | Admitting: Thoracic Surgery (Cardiothoracic Vascular Surgery)

## 2020-11-06 ENCOUNTER — Ambulatory Visit
Admission: RE | Admit: 2020-11-06 | Discharge: 2020-11-06 | Disposition: A | Discharge status: Home / Self Care | Payer: Medicare Other | Source: Ambulatory Visit | Attending: Thoracic Surgery (Cardiothoracic Vascular Surgery) | Admitting: Thoracic Surgery (Cardiothoracic Vascular Surgery)

## 2020-11-06 ENCOUNTER — Other Ambulatory Visit: Payer: Self-pay

## 2020-11-06 VITALS — BP 130/86 | HR 110 | Resp 20 | Ht 64.0 in | Wt 120.0 lb

## 2020-11-06 DIAGNOSIS — R918 Other nonspecific abnormal finding of lung field: Secondary | ICD-10-CM | POA: Diagnosis not present

## 2020-11-06 DIAGNOSIS — J90 Pleural effusion, not elsewhere classified: Secondary | ICD-10-CM | POA: Diagnosis not present

## 2020-11-06 DIAGNOSIS — C799 Secondary malignant neoplasm of unspecified site: Secondary | ICD-10-CM | POA: Diagnosis not present

## 2020-11-06 DIAGNOSIS — C3491 Malignant neoplasm of unspecified part of right bronchus or lung: Secondary | ICD-10-CM | POA: Diagnosis not present

## 2020-11-06 NOTE — Progress Notes (Signed)
[address]       [place] [address]             [phone]     HPI: Valerie Long returns for follow-up of her malignant pleural effusion.  Valerie Long is a 75 year old woman with history of stage IV lung cancer with a malignant right pleural effusion.  She had a pleural catheter placed back in March.  She was draining high volumes initially but it started tapering off.  When I last saw her on 10/28/2020 she was getting no drainage from the catheter.  I was able to flush the catheter but could not draw back on it.  She had thrombolytics instilled on 10/31/2020.  She has not tried to drain the catheter since.  On arrival to the office that catheter was hooked to a suction bottle.  About 350 mL of amber fluid was evacuated.  The procedure was stopped due to discomfort.  Past Medical History:  Diagnosis Date  . Adenocarcinoma of right lung, stage 4 (Valerie Long) 2017   lump nodes  . Anemia   . Arthritis   . Bone metastases (Valerie Long) 02/24/2017  . Encounter for antineoplastic chemotherapy 03/17/2016  . Hypercholesterolemia   . Hypertension 06/18/2016   just when I go to the hospital   . Osteopenia   . Pelvic kidney    Left. On CT in [place]  . Pneumonia   . Shortness of breath dyspnea   . 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 acetaminophen (TYLENOL) 500 MG tablet Take 1,000 mg by mouth at bedtime.    Marland Kitchen amLODipine (NORVASC) 2.5 MG tablet Take 1 tablet (2.5 mg total) by mouth daily. 30 tablet 6  . Cholecalciferol (VITAMIN D3 PO) Take 1,200 mg by mouth daily. 600 mg each    . cyclobenzaprine (FLEXERIL) 10 MG tablet Take 1 tablet (10 mg total) by mouth 2 (two) times daily as needed for muscle spasms. 20 tablet 0  . Glycerin-Hypromellose-PEG 400 (DRY EYE RELIEF DROPS) 0.2-0.2-1 % SOLN Place 1 drop into both eyes 2 (two) times daily.    . hydrOXYzine (ATARAX/VISTARIL) 25 MG tablet Take 2 tablets  (50 mg total) by mouth every 8 (eight) hours as needed for anxiety, nausea or vomiting (before bed for sleep). 12 tablet 0  . lidocaine (LIDODERM) 5 % Place 1 patch onto the skin daily. Remove & Discard patch within 12 hours or as directed by MD 15 patch 0  . lidocaine-prilocaine (EMLA) cream Apply 1 application topically daily as needed (every four weeks).    Marland Kitchen lip balm (CARMEX) ointment Apply topically as needed for lip care. (Patient taking differently: Apply 1 application topically as needed for lip care.) 7 g 0  . pantoprazole (PROTONIX) 40 MG tablet Take 1 tablet (40 mg total) by mouth daily at 6 (six) AM. 30 tablet 1  . prochlorperazine (COMPAZINE) 10 MG tablet TAKE 1 TABLET BY MOUTH EVERY 6 HOURS AS NEEDED FOR NAUSEA OR VOMITING. 30 tablet 0  . prochlorperazine (COMPAZINE) 10 MG tablet Take 1 tablet (10 mg total) by mouth every 6 (six) hours as needed for nausea or vomiting. 30 tablet 0  . senna-docusate (SENOKOT-S) 8.6-50 MG tablet Take 1 tablet by mouth 2 (two) times daily.    . traMADol (ULTRAM) 50 MG tablet Take 1 tablet (50 mg total) by mouth every 6 (six) hours as needed for moderate pain  or severe pain. 28 tablet 0   No current facility-administered medications for this visit.    Physical Exam BP 130/86   Pulse (!) 110   Resp 20   Ht 5\' 4"  (1.626 m)   Wt 120 lb (54.4 kg)   LMP 06/09/1998   SpO2 100% Comment: 2L O2 per Valerie Long  BMI 20.60 kg/m  Frail-appearing 75 year old woman with increased work of breathing Alert and oriented x3 Tachycardic  Diagnostic Tests: I personally reviewed the chest x-ray image.  It showed bilateral effusions right greater than left.  There were also bilateral airspace opacities.  Impression: Valerie Long is a 75 year old woman with stage IV lung cancer with a malignant right pleural effusion.  She had a pleural catheter placed about 3 months ago.  She had high output and good symptomatic relief initially but over time the output dropped off.  When  I saw her last week we did flush the catheter but could not draw back on it.  She had thrombolytic therapy on 10/31/2020.  Today was the first time she is drained and she drank about 350 mL before stopping due to discomfort.  Recommended that she drain the catheter again tomorrow.  Then about once a week although she can do it more frequently if needed.  If she has any trouble with the catheter function or would like to have the catheter removed she can contact us and we can see her about that anytime  She is now on hospice care.   Valerie Nakayama, MD [facility] [phone]

## 2020-11-07 DIAGNOSIS — C7951 Secondary malignant neoplasm of bone: Secondary | ICD-10-CM | POA: Diagnosis not present

## 2020-11-07 DIAGNOSIS — D630 Anemia in neoplastic disease: Secondary | ICD-10-CM | POA: Diagnosis not present

## 2020-11-07 DIAGNOSIS — I10 Essential (primary) hypertension: Secondary | ICD-10-CM | POA: Diagnosis not present

## 2020-11-07 DIAGNOSIS — R7611 Nonspecific reaction to tuberculin skin test without active tuberculosis: Secondary | ICD-10-CM | POA: Diagnosis not present

## 2020-11-07 DIAGNOSIS — E785 Hyperlipidemia, unspecified: Secondary | ICD-10-CM | POA: Diagnosis not present

## 2020-11-07 DIAGNOSIS — C3490 Malignant neoplasm of unspecified part of unspecified bronchus or lung: Secondary | ICD-10-CM | POA: Diagnosis not present

## 2020-11-08 DIAGNOSIS — C7951 Secondary malignant neoplasm of bone: Secondary | ICD-10-CM | POA: Diagnosis not present

## 2020-11-08 DIAGNOSIS — C3490 Malignant neoplasm of unspecified part of unspecified bronchus or lung: Secondary | ICD-10-CM | POA: Diagnosis not present

## 2020-11-08 DIAGNOSIS — E785 Hyperlipidemia, unspecified: Secondary | ICD-10-CM | POA: Diagnosis not present

## 2020-11-08 DIAGNOSIS — R7611 Nonspecific reaction to tuberculin skin test without active tuberculosis: Secondary | ICD-10-CM | POA: Diagnosis not present

## 2020-11-08 DIAGNOSIS — D630 Anemia in neoplastic disease: Secondary | ICD-10-CM | POA: Diagnosis not present

## 2020-11-08 DIAGNOSIS — I10 Essential (primary) hypertension: Secondary | ICD-10-CM | POA: Diagnosis not present

## 2020-11-11 DIAGNOSIS — C7951 Secondary malignant neoplasm of bone: Secondary | ICD-10-CM | POA: Diagnosis not present

## 2020-11-11 DIAGNOSIS — C3490 Malignant neoplasm of unspecified part of unspecified bronchus or lung: Secondary | ICD-10-CM | POA: Diagnosis not present

## 2020-11-11 DIAGNOSIS — R7611 Nonspecific reaction to tuberculin skin test without active tuberculosis: Secondary | ICD-10-CM | POA: Diagnosis not present

## 2020-11-11 DIAGNOSIS — E785 Hyperlipidemia, unspecified: Secondary | ICD-10-CM | POA: Diagnosis not present

## 2020-11-11 DIAGNOSIS — D630 Anemia in neoplastic disease: Secondary | ICD-10-CM | POA: Diagnosis not present

## 2020-11-11 DIAGNOSIS — I10 Essential (primary) hypertension: Secondary | ICD-10-CM | POA: Diagnosis not present

## 2020-11-11 NOTE — Op Note (Signed)
OPERATIVE NOTE  [name] is a 75 year old woman with stage IV lung cancer.  She has had a pleural catheter in place for a malignant pleural effusion.  Catheters not been draining well.  She presents for thrombolytics.  Using sterile technique 5 mg of dornase and 10 mg of alteplase were injected through the pleural catheter by Quincy Simmonds, PA.  Patient tolerated well.  Revonda Standard Roxan Hockey, MD [facility] [phone]

## 2020-11-13 DIAGNOSIS — I10 Essential (primary) hypertension: Secondary | ICD-10-CM | POA: Diagnosis not present

## 2020-11-13 DIAGNOSIS — C3490 Malignant neoplasm of unspecified part of unspecified bronchus or lung: Secondary | ICD-10-CM | POA: Diagnosis not present

## 2020-11-13 DIAGNOSIS — C7951 Secondary malignant neoplasm of bone: Secondary | ICD-10-CM | POA: Diagnosis not present

## 2020-11-13 DIAGNOSIS — D630 Anemia in neoplastic disease: Secondary | ICD-10-CM | POA: Diagnosis not present

## 2020-11-13 DIAGNOSIS — R7611 Nonspecific reaction to tuberculin skin test without active tuberculosis: Secondary | ICD-10-CM | POA: Diagnosis not present

## 2020-11-13 DIAGNOSIS — E785 Hyperlipidemia, unspecified: Secondary | ICD-10-CM | POA: Diagnosis not present

## 2020-11-14 ENCOUNTER — Encounter: Payer: Self-pay | Admitting: Internal Medicine

## 2020-11-14 DIAGNOSIS — R7611 Nonspecific reaction to tuberculin skin test without active tuberculosis: Secondary | ICD-10-CM | POA: Diagnosis not present

## 2020-11-14 DIAGNOSIS — C3490 Malignant neoplasm of unspecified part of unspecified bronchus or lung: Secondary | ICD-10-CM | POA: Diagnosis not present

## 2020-11-14 DIAGNOSIS — C7951 Secondary malignant neoplasm of bone: Secondary | ICD-10-CM | POA: Diagnosis not present

## 2020-11-14 DIAGNOSIS — D630 Anemia in neoplastic disease: Secondary | ICD-10-CM | POA: Diagnosis not present

## 2020-11-14 DIAGNOSIS — E785 Hyperlipidemia, unspecified: Secondary | ICD-10-CM | POA: Diagnosis not present

## 2020-11-14 DIAGNOSIS — I10 Essential (primary) hypertension: Secondary | ICD-10-CM | POA: Diagnosis not present

## 2020-11-17 ENCOUNTER — Telehealth: Payer: Self-pay | Admitting: Internal Medicine

## 2020-11-17 DIAGNOSIS — C3490 Malignant neoplasm of unspecified part of unspecified bronchus or lung: Secondary | ICD-10-CM | POA: Diagnosis not present

## 2020-11-17 DIAGNOSIS — R7611 Nonspecific reaction to tuberculin skin test without active tuberculosis: Secondary | ICD-10-CM | POA: Diagnosis not present

## 2020-11-17 DIAGNOSIS — I10 Essential (primary) hypertension: Secondary | ICD-10-CM | POA: Diagnosis not present

## 2020-11-17 DIAGNOSIS — C3491 Malignant neoplasm of unspecified part of right bronchus or lung: Secondary | ICD-10-CM

## 2020-11-17 DIAGNOSIS — E785 Hyperlipidemia, unspecified: Secondary | ICD-10-CM | POA: Diagnosis not present

## 2020-11-17 DIAGNOSIS — D630 Anemia in neoplastic disease: Secondary | ICD-10-CM | POA: Diagnosis not present

## 2020-11-17 DIAGNOSIS — C7951 Secondary malignant neoplasm of bone: Secondary | ICD-10-CM | POA: Diagnosis not present

## 2020-11-17 NOTE — Telephone Encounter (Signed)
Refill for tessalon- no longer on med list. Okay to refill?

## 2020-11-17 NOTE — Telephone Encounter (Signed)
Rx sent 

## 2020-11-18 DIAGNOSIS — D630 Anemia in neoplastic disease: Secondary | ICD-10-CM | POA: Diagnosis not present

## 2020-11-18 DIAGNOSIS — C3490 Malignant neoplasm of unspecified part of unspecified bronchus or lung: Secondary | ICD-10-CM | POA: Diagnosis not present

## 2020-11-18 DIAGNOSIS — C7951 Secondary malignant neoplasm of bone: Secondary | ICD-10-CM | POA: Diagnosis not present

## 2020-11-18 DIAGNOSIS — I10 Essential (primary) hypertension: Secondary | ICD-10-CM | POA: Diagnosis not present

## 2020-11-18 DIAGNOSIS — E785 Hyperlipidemia, unspecified: Secondary | ICD-10-CM | POA: Diagnosis not present

## 2020-11-18 DIAGNOSIS — R7611 Nonspecific reaction to tuberculin skin test without active tuberculosis: Secondary | ICD-10-CM | POA: Diagnosis not present

## 2020-11-19 ENCOUNTER — Ambulatory Visit: Payer: Medicare Other | Admitting: Physician Assistant

## 2020-11-19 ENCOUNTER — Other Ambulatory Visit: Payer: Medicare Other

## 2020-11-19 ENCOUNTER — Ambulatory Visit: Payer: Medicare Other

## 2020-11-19 DIAGNOSIS — C7951 Secondary malignant neoplasm of bone: Secondary | ICD-10-CM | POA: Diagnosis not present

## 2020-11-19 DIAGNOSIS — E785 Hyperlipidemia, unspecified: Secondary | ICD-10-CM | POA: Diagnosis not present

## 2020-11-19 DIAGNOSIS — C3490 Malignant neoplasm of unspecified part of unspecified bronchus or lung: Secondary | ICD-10-CM | POA: Diagnosis not present

## 2020-11-19 DIAGNOSIS — D630 Anemia in neoplastic disease: Secondary | ICD-10-CM | POA: Diagnosis not present

## 2020-11-19 DIAGNOSIS — R7611 Nonspecific reaction to tuberculin skin test without active tuberculosis: Secondary | ICD-10-CM | POA: Diagnosis not present

## 2020-11-19 DIAGNOSIS — I10 Essential (primary) hypertension: Secondary | ICD-10-CM | POA: Diagnosis not present

## 2020-11-21 DIAGNOSIS — C3490 Malignant neoplasm of unspecified part of unspecified bronchus or lung: Secondary | ICD-10-CM | POA: Diagnosis not present

## 2020-11-21 DIAGNOSIS — C7951 Secondary malignant neoplasm of bone: Secondary | ICD-10-CM | POA: Diagnosis not present

## 2020-11-21 DIAGNOSIS — E785 Hyperlipidemia, unspecified: Secondary | ICD-10-CM | POA: Diagnosis not present

## 2020-11-21 DIAGNOSIS — D630 Anemia in neoplastic disease: Secondary | ICD-10-CM | POA: Diagnosis not present

## 2020-11-21 DIAGNOSIS — I10 Essential (primary) hypertension: Secondary | ICD-10-CM | POA: Diagnosis not present

## 2020-11-21 DIAGNOSIS — R7611 Nonspecific reaction to tuberculin skin test without active tuberculosis: Secondary | ICD-10-CM | POA: Diagnosis not present

## 2020-11-24 DIAGNOSIS — R7611 Nonspecific reaction to tuberculin skin test without active tuberculosis: Secondary | ICD-10-CM | POA: Diagnosis not present

## 2020-11-24 DIAGNOSIS — I10 Essential (primary) hypertension: Secondary | ICD-10-CM | POA: Diagnosis not present

## 2020-11-24 DIAGNOSIS — D630 Anemia in neoplastic disease: Secondary | ICD-10-CM | POA: Diagnosis not present

## 2020-11-24 DIAGNOSIS — C3490 Malignant neoplasm of unspecified part of unspecified bronchus or lung: Secondary | ICD-10-CM | POA: Diagnosis not present

## 2020-11-24 DIAGNOSIS — C7951 Secondary malignant neoplasm of bone: Secondary | ICD-10-CM | POA: Diagnosis not present

## 2020-11-24 DIAGNOSIS — E785 Hyperlipidemia, unspecified: Secondary | ICD-10-CM | POA: Diagnosis not present

## 2020-11-25 DIAGNOSIS — I10 Essential (primary) hypertension: Secondary | ICD-10-CM | POA: Diagnosis not present

## 2020-11-25 DIAGNOSIS — C3490 Malignant neoplasm of unspecified part of unspecified bronchus or lung: Secondary | ICD-10-CM | POA: Diagnosis not present

## 2020-11-25 DIAGNOSIS — E785 Hyperlipidemia, unspecified: Secondary | ICD-10-CM | POA: Diagnosis not present

## 2020-11-25 DIAGNOSIS — D630 Anemia in neoplastic disease: Secondary | ICD-10-CM | POA: Diagnosis not present

## 2020-11-25 DIAGNOSIS — R7611 Nonspecific reaction to tuberculin skin test without active tuberculosis: Secondary | ICD-10-CM | POA: Diagnosis not present

## 2020-11-25 DIAGNOSIS — C7951 Secondary malignant neoplasm of bone: Secondary | ICD-10-CM | POA: Diagnosis not present

## 2020-11-26 DIAGNOSIS — R7611 Nonspecific reaction to tuberculin skin test without active tuberculosis: Secondary | ICD-10-CM | POA: Diagnosis not present

## 2020-11-26 DIAGNOSIS — I10 Essential (primary) hypertension: Secondary | ICD-10-CM | POA: Diagnosis not present

## 2020-11-26 DIAGNOSIS — D630 Anemia in neoplastic disease: Secondary | ICD-10-CM | POA: Diagnosis not present

## 2020-11-26 DIAGNOSIS — C7951 Secondary malignant neoplasm of bone: Secondary | ICD-10-CM | POA: Diagnosis not present

## 2020-11-26 DIAGNOSIS — E785 Hyperlipidemia, unspecified: Secondary | ICD-10-CM | POA: Diagnosis not present

## 2020-11-26 DIAGNOSIS — C3490 Malignant neoplasm of unspecified part of unspecified bronchus or lung: Secondary | ICD-10-CM | POA: Diagnosis not present

## 2020-11-28 DIAGNOSIS — R7611 Nonspecific reaction to tuberculin skin test without active tuberculosis: Secondary | ICD-10-CM | POA: Diagnosis not present

## 2020-11-28 DIAGNOSIS — D630 Anemia in neoplastic disease: Secondary | ICD-10-CM | POA: Diagnosis not present

## 2020-11-28 DIAGNOSIS — E785 Hyperlipidemia, unspecified: Secondary | ICD-10-CM | POA: Diagnosis not present

## 2020-11-28 DIAGNOSIS — C3490 Malignant neoplasm of unspecified part of unspecified bronchus or lung: Secondary | ICD-10-CM | POA: Diagnosis not present

## 2020-11-28 DIAGNOSIS — C7951 Secondary malignant neoplasm of bone: Secondary | ICD-10-CM | POA: Diagnosis not present

## 2020-11-28 DIAGNOSIS — I10 Essential (primary) hypertension: Secondary | ICD-10-CM | POA: Diagnosis not present

## 2020-12-02 DIAGNOSIS — R7611 Nonspecific reaction to tuberculin skin test without active tuberculosis: Secondary | ICD-10-CM | POA: Diagnosis not present

## 2020-12-02 DIAGNOSIS — I10 Essential (primary) hypertension: Secondary | ICD-10-CM | POA: Diagnosis not present

## 2020-12-02 DIAGNOSIS — D630 Anemia in neoplastic disease: Secondary | ICD-10-CM | POA: Diagnosis not present

## 2020-12-02 DIAGNOSIS — C3490 Malignant neoplasm of unspecified part of unspecified bronchus or lung: Secondary | ICD-10-CM | POA: Diagnosis not present

## 2020-12-02 DIAGNOSIS — E785 Hyperlipidemia, unspecified: Secondary | ICD-10-CM | POA: Diagnosis not present

## 2020-12-02 DIAGNOSIS — C7951 Secondary malignant neoplasm of bone: Secondary | ICD-10-CM | POA: Diagnosis not present

## 2020-12-03 DIAGNOSIS — E785 Hyperlipidemia, unspecified: Secondary | ICD-10-CM | POA: Diagnosis not present

## 2020-12-03 DIAGNOSIS — C7951 Secondary malignant neoplasm of bone: Secondary | ICD-10-CM | POA: Diagnosis not present

## 2020-12-03 DIAGNOSIS — I10 Essential (primary) hypertension: Secondary | ICD-10-CM | POA: Diagnosis not present

## 2020-12-03 DIAGNOSIS — D630 Anemia in neoplastic disease: Secondary | ICD-10-CM | POA: Diagnosis not present

## 2020-12-03 DIAGNOSIS — C3490 Malignant neoplasm of unspecified part of unspecified bronchus or lung: Secondary | ICD-10-CM | POA: Diagnosis not present

## 2020-12-03 DIAGNOSIS — R7611 Nonspecific reaction to tuberculin skin test without active tuberculosis: Secondary | ICD-10-CM | POA: Diagnosis not present

## 2020-12-04 DIAGNOSIS — I10 Essential (primary) hypertension: Secondary | ICD-10-CM | POA: Diagnosis not present

## 2020-12-04 DIAGNOSIS — C7951 Secondary malignant neoplasm of bone: Secondary | ICD-10-CM | POA: Diagnosis not present

## 2020-12-04 DIAGNOSIS — E785 Hyperlipidemia, unspecified: Secondary | ICD-10-CM | POA: Diagnosis not present

## 2020-12-04 DIAGNOSIS — C3490 Malignant neoplasm of unspecified part of unspecified bronchus or lung: Secondary | ICD-10-CM | POA: Diagnosis not present

## 2020-12-04 DIAGNOSIS — R7611 Nonspecific reaction to tuberculin skin test without active tuberculosis: Secondary | ICD-10-CM | POA: Diagnosis not present

## 2020-12-04 DIAGNOSIS — D630 Anemia in neoplastic disease: Secondary | ICD-10-CM | POA: Diagnosis not present

## 2020-12-05 ENCOUNTER — Telehealth: Payer: Self-pay

## 2020-12-05 DIAGNOSIS — H04129 Dry eye syndrome of unspecified lacrimal gland: Secondary | ICD-10-CM | POA: Diagnosis not present

## 2020-12-05 DIAGNOSIS — M1990 Unspecified osteoarthritis, unspecified site: Secondary | ICD-10-CM | POA: Diagnosis not present

## 2020-12-05 DIAGNOSIS — E785 Hyperlipidemia, unspecified: Secondary | ICD-10-CM | POA: Diagnosis not present

## 2020-12-05 DIAGNOSIS — D630 Anemia in neoplastic disease: Secondary | ICD-10-CM | POA: Diagnosis not present

## 2020-12-05 DIAGNOSIS — R7611 Nonspecific reaction to tuberculin skin test without active tuberculosis: Secondary | ICD-10-CM | POA: Diagnosis not present

## 2020-12-05 DIAGNOSIS — M8580 Other specified disorders of bone density and structure, unspecified site: Secondary | ICD-10-CM | POA: Diagnosis not present

## 2020-12-05 DIAGNOSIS — Z6821 Body mass index (BMI) 21.0-21.9, adult: Secondary | ICD-10-CM | POA: Diagnosis not present

## 2020-12-05 DIAGNOSIS — C3490 Malignant neoplasm of unspecified part of unspecified bronchus or lung: Secondary | ICD-10-CM | POA: Diagnosis not present

## 2020-12-05 DIAGNOSIS — C7951 Secondary malignant neoplasm of bone: Secondary | ICD-10-CM | POA: Diagnosis not present

## 2020-12-05 DIAGNOSIS — I10 Essential (primary) hypertension: Secondary | ICD-10-CM | POA: Diagnosis not present

## 2020-12-05 NOTE — Telephone Encounter (Signed)
[facility] w[facility] called stating pt is having ongoing thrush and completed Fluconazole and has tried Nystatin and Magic Mouthwash. Pt is having throat discomfort when swallowing. She shares that pts family indicated when pt was seen in [place], there was something she was given that helped but don't recall what it was.  Discussed with Cassandra PA-C who reviewed this rx and recommends for pt to take Fluconazole 200mg  since she previously took 100mg . If the pt still does not recover, the recommendation is to do a culture as sx may be due to a different species of Candida. Mendel Ryder expressed understanding of this information.

## 2020-12-06 DIAGNOSIS — R7611 Nonspecific reaction to tuberculin skin test without active tuberculosis: Secondary | ICD-10-CM | POA: Diagnosis not present

## 2020-12-06 DIAGNOSIS — C3490 Malignant neoplasm of unspecified part of unspecified bronchus or lung: Secondary | ICD-10-CM | POA: Diagnosis not present

## 2020-12-06 DIAGNOSIS — D630 Anemia in neoplastic disease: Secondary | ICD-10-CM | POA: Diagnosis not present

## 2020-12-06 DIAGNOSIS — E785 Hyperlipidemia, unspecified: Secondary | ICD-10-CM | POA: Diagnosis not present

## 2020-12-06 DIAGNOSIS — C7951 Secondary malignant neoplasm of bone: Secondary | ICD-10-CM | POA: Diagnosis not present

## 2020-12-06 DIAGNOSIS — I10 Essential (primary) hypertension: Secondary | ICD-10-CM | POA: Diagnosis not present

## 2020-12-09 DIAGNOSIS — C3490 Malignant neoplasm of unspecified part of unspecified bronchus or lung: Secondary | ICD-10-CM | POA: Diagnosis not present

## 2020-12-09 DIAGNOSIS — E785 Hyperlipidemia, unspecified: Secondary | ICD-10-CM | POA: Diagnosis not present

## 2020-12-09 DIAGNOSIS — D630 Anemia in neoplastic disease: Secondary | ICD-10-CM | POA: Diagnosis not present

## 2020-12-09 DIAGNOSIS — C7951 Secondary malignant neoplasm of bone: Secondary | ICD-10-CM | POA: Diagnosis not present

## 2020-12-09 DIAGNOSIS — I10 Essential (primary) hypertension: Secondary | ICD-10-CM | POA: Diagnosis not present

## 2020-12-09 DIAGNOSIS — R7611 Nonspecific reaction to tuberculin skin test without active tuberculosis: Secondary | ICD-10-CM | POA: Diagnosis not present

## 2020-12-10 DIAGNOSIS — R7611 Nonspecific reaction to tuberculin skin test without active tuberculosis: Secondary | ICD-10-CM | POA: Diagnosis not present

## 2020-12-10 DIAGNOSIS — C3490 Malignant neoplasm of unspecified part of unspecified bronchus or lung: Secondary | ICD-10-CM | POA: Diagnosis not present

## 2020-12-10 DIAGNOSIS — D630 Anemia in neoplastic disease: Secondary | ICD-10-CM | POA: Diagnosis not present

## 2020-12-10 DIAGNOSIS — I10 Essential (primary) hypertension: Secondary | ICD-10-CM | POA: Diagnosis not present

## 2020-12-10 DIAGNOSIS — C7951 Secondary malignant neoplasm of bone: Secondary | ICD-10-CM | POA: Diagnosis not present

## 2020-12-10 DIAGNOSIS — E785 Hyperlipidemia, unspecified: Secondary | ICD-10-CM | POA: Diagnosis not present

## 2020-12-11 DIAGNOSIS — C3490 Malignant neoplasm of unspecified part of unspecified bronchus or lung: Secondary | ICD-10-CM | POA: Diagnosis not present

## 2020-12-11 DIAGNOSIS — E785 Hyperlipidemia, unspecified: Secondary | ICD-10-CM | POA: Diagnosis not present

## 2020-12-11 DIAGNOSIS — C7951 Secondary malignant neoplasm of bone: Secondary | ICD-10-CM | POA: Diagnosis not present

## 2020-12-11 DIAGNOSIS — R7611 Nonspecific reaction to tuberculin skin test without active tuberculosis: Secondary | ICD-10-CM | POA: Diagnosis not present

## 2020-12-11 DIAGNOSIS — D630 Anemia in neoplastic disease: Secondary | ICD-10-CM | POA: Diagnosis not present

## 2020-12-11 DIAGNOSIS — I10 Essential (primary) hypertension: Secondary | ICD-10-CM | POA: Diagnosis not present

## 2020-12-12 DIAGNOSIS — C3490 Malignant neoplasm of unspecified part of unspecified bronchus or lung: Secondary | ICD-10-CM | POA: Diagnosis not present

## 2020-12-12 DIAGNOSIS — I10 Essential (primary) hypertension: Secondary | ICD-10-CM | POA: Diagnosis not present

## 2020-12-12 DIAGNOSIS — E785 Hyperlipidemia, unspecified: Secondary | ICD-10-CM | POA: Diagnosis not present

## 2020-12-12 DIAGNOSIS — C7951 Secondary malignant neoplasm of bone: Secondary | ICD-10-CM | POA: Diagnosis not present

## 2020-12-12 DIAGNOSIS — R7611 Nonspecific reaction to tuberculin skin test without active tuberculosis: Secondary | ICD-10-CM | POA: Diagnosis not present

## 2020-12-12 DIAGNOSIS — D630 Anemia in neoplastic disease: Secondary | ICD-10-CM | POA: Diagnosis not present

## 2020-12-16 DIAGNOSIS — I10 Essential (primary) hypertension: Secondary | ICD-10-CM | POA: Diagnosis not present

## 2020-12-16 DIAGNOSIS — C7951 Secondary malignant neoplasm of bone: Secondary | ICD-10-CM | POA: Diagnosis not present

## 2020-12-16 DIAGNOSIS — D630 Anemia in neoplastic disease: Secondary | ICD-10-CM | POA: Diagnosis not present

## 2020-12-16 DIAGNOSIS — C3490 Malignant neoplasm of unspecified part of unspecified bronchus or lung: Secondary | ICD-10-CM | POA: Diagnosis not present

## 2020-12-16 DIAGNOSIS — R7611 Nonspecific reaction to tuberculin skin test without active tuberculosis: Secondary | ICD-10-CM | POA: Diagnosis not present

## 2020-12-16 DIAGNOSIS — E785 Hyperlipidemia, unspecified: Secondary | ICD-10-CM | POA: Diagnosis not present

## 2020-12-17 ENCOUNTER — Ambulatory Visit: Payer: Medicare Other

## 2020-12-17 ENCOUNTER — Ambulatory Visit: Payer: Medicare Other | Admitting: Internal Medicine

## 2020-12-17 ENCOUNTER — Other Ambulatory Visit: Payer: Medicare Other

## 2020-12-24 DIAGNOSIS — C7951 Secondary malignant neoplasm of bone: Secondary | ICD-10-CM | POA: Diagnosis not present

## 2020-12-24 DIAGNOSIS — E785 Hyperlipidemia, unspecified: Secondary | ICD-10-CM | POA: Diagnosis not present

## 2020-12-24 DIAGNOSIS — C3490 Malignant neoplasm of unspecified part of unspecified bronchus or lung: Secondary | ICD-10-CM | POA: Diagnosis not present

## 2020-12-24 DIAGNOSIS — D630 Anemia in neoplastic disease: Secondary | ICD-10-CM | POA: Diagnosis not present

## 2020-12-24 DIAGNOSIS — R7611 Nonspecific reaction to tuberculin skin test without active tuberculosis: Secondary | ICD-10-CM | POA: Diagnosis not present

## 2020-12-24 DIAGNOSIS — I10 Essential (primary) hypertension: Secondary | ICD-10-CM | POA: Diagnosis not present

## 2020-12-25 DIAGNOSIS — C7951 Secondary malignant neoplasm of bone: Secondary | ICD-10-CM | POA: Diagnosis not present

## 2020-12-25 DIAGNOSIS — E785 Hyperlipidemia, unspecified: Secondary | ICD-10-CM | POA: Diagnosis not present

## 2020-12-25 DIAGNOSIS — D630 Anemia in neoplastic disease: Secondary | ICD-10-CM | POA: Diagnosis not present

## 2020-12-25 DIAGNOSIS — C3490 Malignant neoplasm of unspecified part of unspecified bronchus or lung: Secondary | ICD-10-CM | POA: Diagnosis not present

## 2020-12-25 DIAGNOSIS — R7611 Nonspecific reaction to tuberculin skin test without active tuberculosis: Secondary | ICD-10-CM | POA: Diagnosis not present

## 2020-12-25 DIAGNOSIS — I10 Essential (primary) hypertension: Secondary | ICD-10-CM | POA: Diagnosis not present

## 2020-12-26 DIAGNOSIS — I10 Essential (primary) hypertension: Secondary | ICD-10-CM | POA: Diagnosis not present

## 2020-12-26 DIAGNOSIS — C3490 Malignant neoplasm of unspecified part of unspecified bronchus or lung: Secondary | ICD-10-CM | POA: Diagnosis not present

## 2020-12-26 DIAGNOSIS — R7611 Nonspecific reaction to tuberculin skin test without active tuberculosis: Secondary | ICD-10-CM | POA: Diagnosis not present

## 2020-12-26 DIAGNOSIS — C7951 Secondary malignant neoplasm of bone: Secondary | ICD-10-CM | POA: Diagnosis not present

## 2020-12-26 DIAGNOSIS — E785 Hyperlipidemia, unspecified: Secondary | ICD-10-CM | POA: Diagnosis not present

## 2020-12-26 DIAGNOSIS — D630 Anemia in neoplastic disease: Secondary | ICD-10-CM | POA: Diagnosis not present

## 2020-12-30 DIAGNOSIS — D630 Anemia in neoplastic disease: Secondary | ICD-10-CM | POA: Diagnosis not present

## 2020-12-30 DIAGNOSIS — C7951 Secondary malignant neoplasm of bone: Secondary | ICD-10-CM | POA: Diagnosis not present

## 2020-12-30 DIAGNOSIS — R7611 Nonspecific reaction to tuberculin skin test without active tuberculosis: Secondary | ICD-10-CM | POA: Diagnosis not present

## 2020-12-30 DIAGNOSIS — I10 Essential (primary) hypertension: Secondary | ICD-10-CM | POA: Diagnosis not present

## 2020-12-30 DIAGNOSIS — E785 Hyperlipidemia, unspecified: Secondary | ICD-10-CM | POA: Diagnosis not present

## 2020-12-30 DIAGNOSIS — C3490 Malignant neoplasm of unspecified part of unspecified bronchus or lung: Secondary | ICD-10-CM | POA: Diagnosis not present

## 2020-12-31 DIAGNOSIS — C3490 Malignant neoplasm of unspecified part of unspecified bronchus or lung: Secondary | ICD-10-CM | POA: Diagnosis not present

## 2020-12-31 DIAGNOSIS — I10 Essential (primary) hypertension: Secondary | ICD-10-CM | POA: Diagnosis not present

## 2020-12-31 DIAGNOSIS — D630 Anemia in neoplastic disease: Secondary | ICD-10-CM | POA: Diagnosis not present

## 2020-12-31 DIAGNOSIS — R7611 Nonspecific reaction to tuberculin skin test without active tuberculosis: Secondary | ICD-10-CM | POA: Diagnosis not present

## 2020-12-31 DIAGNOSIS — C7951 Secondary malignant neoplasm of bone: Secondary | ICD-10-CM | POA: Diagnosis not present

## 2020-12-31 DIAGNOSIS — E785 Hyperlipidemia, unspecified: Secondary | ICD-10-CM | POA: Diagnosis not present

## 2021-01-02 DIAGNOSIS — I10 Essential (primary) hypertension: Secondary | ICD-10-CM | POA: Diagnosis not present

## 2021-01-02 DIAGNOSIS — E785 Hyperlipidemia, unspecified: Secondary | ICD-10-CM | POA: Diagnosis not present

## 2021-01-02 DIAGNOSIS — R7611 Nonspecific reaction to tuberculin skin test without active tuberculosis: Secondary | ICD-10-CM | POA: Diagnosis not present

## 2021-01-02 DIAGNOSIS — C3490 Malignant neoplasm of unspecified part of unspecified bronchus or lung: Secondary | ICD-10-CM | POA: Diagnosis not present

## 2021-01-02 DIAGNOSIS — D630 Anemia in neoplastic disease: Secondary | ICD-10-CM | POA: Diagnosis not present

## 2021-01-02 DIAGNOSIS — C7951 Secondary malignant neoplasm of bone: Secondary | ICD-10-CM | POA: Diagnosis not present

## 2021-01-05 DIAGNOSIS — E785 Hyperlipidemia, unspecified: Secondary | ICD-10-CM | POA: Diagnosis not present

## 2021-01-05 DIAGNOSIS — I10 Essential (primary) hypertension: Secondary | ICD-10-CM | POA: Diagnosis not present

## 2021-01-05 DIAGNOSIS — R7611 Nonspecific reaction to tuberculin skin test without active tuberculosis: Secondary | ICD-10-CM | POA: Diagnosis not present

## 2021-01-05 DIAGNOSIS — H04129 Dry eye syndrome of unspecified lacrimal gland: Secondary | ICD-10-CM | POA: Diagnosis not present

## 2021-01-05 DIAGNOSIS — C7951 Secondary malignant neoplasm of bone: Secondary | ICD-10-CM | POA: Diagnosis not present

## 2021-01-05 DIAGNOSIS — D630 Anemia in neoplastic disease: Secondary | ICD-10-CM | POA: Diagnosis not present

## 2021-01-05 DIAGNOSIS — M1990 Unspecified osteoarthritis, unspecified site: Secondary | ICD-10-CM | POA: Diagnosis not present

## 2021-01-05 DIAGNOSIS — Z6821 Body mass index (BMI) 21.0-21.9, adult: Secondary | ICD-10-CM | POA: Diagnosis not present

## 2021-01-05 DIAGNOSIS — C3490 Malignant neoplasm of unspecified part of unspecified bronchus or lung: Secondary | ICD-10-CM | POA: Diagnosis not present

## 2021-01-05 DIAGNOSIS — M8580 Other specified disorders of bone density and structure, unspecified site: Secondary | ICD-10-CM | POA: Diagnosis not present

## 2021-01-06 DIAGNOSIS — I10 Essential (primary) hypertension: Secondary | ICD-10-CM | POA: Diagnosis not present

## 2021-01-06 DIAGNOSIS — D630 Anemia in neoplastic disease: Secondary | ICD-10-CM | POA: Diagnosis not present

## 2021-01-06 DIAGNOSIS — E785 Hyperlipidemia, unspecified: Secondary | ICD-10-CM | POA: Diagnosis not present

## 2021-01-06 DIAGNOSIS — C7951 Secondary malignant neoplasm of bone: Secondary | ICD-10-CM | POA: Diagnosis not present

## 2021-01-06 DIAGNOSIS — C3490 Malignant neoplasm of unspecified part of unspecified bronchus or lung: Secondary | ICD-10-CM | POA: Diagnosis not present

## 2021-01-06 DIAGNOSIS — R7611 Nonspecific reaction to tuberculin skin test without active tuberculosis: Secondary | ICD-10-CM | POA: Diagnosis not present

## 2021-01-07 ENCOUNTER — Telehealth: Payer: Self-pay | Admitting: Medical Oncology

## 2021-01-07 DIAGNOSIS — D630 Anemia in neoplastic disease: Secondary | ICD-10-CM | POA: Diagnosis not present

## 2021-01-07 DIAGNOSIS — I10 Essential (primary) hypertension: Secondary | ICD-10-CM | POA: Diagnosis not present

## 2021-01-07 DIAGNOSIS — R7611 Nonspecific reaction to tuberculin skin test without active tuberculosis: Secondary | ICD-10-CM | POA: Diagnosis not present

## 2021-01-07 DIAGNOSIS — C3490 Malignant neoplasm of unspecified part of unspecified bronchus or lung: Secondary | ICD-10-CM | POA: Diagnosis not present

## 2021-01-07 DIAGNOSIS — E785 Hyperlipidemia, unspecified: Secondary | ICD-10-CM | POA: Diagnosis not present

## 2021-01-07 DIAGNOSIS — C7951 Secondary malignant neoplasm of bone: Secondary | ICD-10-CM | POA: Diagnosis not present

## 2021-02-05 NOTE — Telephone Encounter (Signed)
Valerie Long died peacefully this morning. She was surrounded by her family.

## 2021-02-05 DEATH — deceased
